# Patient Record
Sex: Male | Born: 1970
Health system: Southern US, Community
[De-identification: ages and names within clinical notes are randomized; demographics above are authoritative.]

## PROBLEM LIST (undated history)

## (undated) DIAGNOSIS — K219 Gastro-esophageal reflux disease without esophagitis: Secondary | ICD-10-CM

## (undated) DIAGNOSIS — I10 Essential (primary) hypertension: Secondary | ICD-10-CM

## (undated) DIAGNOSIS — E0500 Thyrotoxicosis with diffuse goiter without thyrotoxic crisis or storm: Secondary | ICD-10-CM

## (undated) DIAGNOSIS — R519 Headache, unspecified: Secondary | ICD-10-CM

## (undated) DIAGNOSIS — H548 Legal blindness, as defined in USA: Secondary | ICD-10-CM

## (undated) DIAGNOSIS — R51 Headache: Secondary | ICD-10-CM

## (undated) DIAGNOSIS — M1990 Unspecified osteoarthritis, unspecified site: Secondary | ICD-10-CM

## (undated) DIAGNOSIS — E785 Hyperlipidemia, unspecified: Secondary | ICD-10-CM

## (undated) DIAGNOSIS — H269 Unspecified cataract: Secondary | ICD-10-CM

## (undated) DIAGNOSIS — I509 Heart failure, unspecified: Secondary | ICD-10-CM

## (undated) DIAGNOSIS — E039 Hypothyroidism, unspecified: Secondary | ICD-10-CM

## (undated) DIAGNOSIS — F329 Major depressive disorder, single episode, unspecified: Secondary | ICD-10-CM

## (undated) DIAGNOSIS — Z9119 Patient's noncompliance with other medical treatment and regimen: Secondary | ICD-10-CM

## (undated) DIAGNOSIS — F32A Depression, unspecified: Secondary | ICD-10-CM

## (undated) DIAGNOSIS — N189 Chronic kidney disease, unspecified: Secondary | ICD-10-CM

## (undated) DIAGNOSIS — U071 COVID-19: Secondary | ICD-10-CM

## (undated) DIAGNOSIS — D649 Anemia, unspecified: Secondary | ICD-10-CM

## (undated) DIAGNOSIS — H547 Unspecified visual loss: Secondary | ICD-10-CM

## (undated) DIAGNOSIS — Z91199 Patient's noncompliance with other medical treatment and regimen due to unspecified reason: Secondary | ICD-10-CM

## (undated) DIAGNOSIS — G629 Polyneuropathy, unspecified: Secondary | ICD-10-CM

## (undated) DIAGNOSIS — IMO0001 Reserved for inherently not codable concepts without codable children: Secondary | ICD-10-CM

## (undated) HISTORY — DX: COVID-19: U07.1

## (undated) HISTORY — DX: Essential (primary) hypertension: I10

## (undated) HISTORY — PX: CIRCUMCISION: SUR203

## (undated) HISTORY — DX: Unspecified cataract: H26.9

## (undated) HISTORY — PX: EYE SURGERY: SHX253

## (undated) HISTORY — PX: COLONOSCOPY: SHX174

## (undated) HISTORY — DX: Gastro-esophageal reflux disease without esophagitis: K21.9

## (undated) HISTORY — PX: WISDOM TOOTH EXTRACTION: SHX21

## (undated) HISTORY — DX: Hyperlipidemia, unspecified: E78.5

---
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
## 1998-01-02 ENCOUNTER — Emergency Department (HOSPITAL_COMMUNITY): Admission: EM | Admit: 1998-01-02 | Discharge: 1998-01-02 | Payer: Self-pay | Admitting: Emergency Medicine

## 2003-06-18 ENCOUNTER — Encounter: Admission: RE | Admit: 2003-06-18 | Discharge: 2003-06-18 | Payer: Self-pay | Admitting: Family Medicine

## 2003-06-18 ENCOUNTER — Encounter: Payer: Self-pay | Admitting: Family Medicine

## 2004-03-15 ENCOUNTER — Emergency Department (HOSPITAL_COMMUNITY): Admission: EM | Admit: 2004-03-15 | Discharge: 2004-03-15 | Payer: Self-pay | Admitting: Family Medicine

## 2004-07-23 ENCOUNTER — Emergency Department (HOSPITAL_COMMUNITY): Admission: EM | Admit: 2004-07-23 | Discharge: 2004-07-23 | Payer: Self-pay | Admitting: Emergency Medicine

## 2005-11-23 ENCOUNTER — Emergency Department (HOSPITAL_COMMUNITY): Admission: EM | Admit: 2005-11-23 | Discharge: 2005-11-23 | Payer: Self-pay | Admitting: *Deleted

## 2005-11-23 ENCOUNTER — Emergency Department (HOSPITAL_COMMUNITY): Admission: EM | Admit: 2005-11-23 | Discharge: 2005-11-23 | Payer: Self-pay | Admitting: Emergency Medicine

## 2006-01-11 ENCOUNTER — Emergency Department (HOSPITAL_COMMUNITY): Admission: EM | Admit: 2006-01-11 | Discharge: 2006-01-11 | Payer: Self-pay | Admitting: Family Medicine

## 2006-03-04 ENCOUNTER — Emergency Department (HOSPITAL_COMMUNITY): Admission: EM | Admit: 2006-03-04 | Discharge: 2006-03-04 | Payer: Self-pay | Admitting: *Deleted

## 2006-03-24 ENCOUNTER — Ambulatory Visit (HOSPITAL_COMMUNITY): Admission: RE | Admit: 2006-03-24 | Discharge: 2006-03-24 | Payer: Self-pay | Admitting: Orthopedic Surgery

## 2006-04-20 ENCOUNTER — Encounter: Admission: RE | Admit: 2006-04-20 | Discharge: 2006-04-20 | Payer: Self-pay | Admitting: Orthopedic Surgery

## 2006-12-31 ENCOUNTER — Emergency Department (HOSPITAL_COMMUNITY): Admission: EM | Admit: 2006-12-31 | Discharge: 2006-12-31 | Payer: Self-pay | Admitting: Family Medicine

## 2008-04-12 ENCOUNTER — Encounter: Admission: RE | Admit: 2008-04-12 | Discharge: 2008-04-12 | Payer: Self-pay | Admitting: Orthopedic Surgery

## 2008-04-17 ENCOUNTER — Encounter: Admission: RE | Admit: 2008-04-17 | Discharge: 2008-04-17 | Payer: Self-pay | Admitting: Orthopedic Surgery

## 2010-10-24 ENCOUNTER — Encounter: Payer: Self-pay | Admitting: Orthopedic Surgery

## 2011-05-16 ENCOUNTER — Emergency Department (HOSPITAL_COMMUNITY)
Admission: EM | Admit: 2011-05-16 | Discharge: 2011-05-16 | Disposition: A | Discharge status: Home / Self Care | Payer: Self-pay | Attending: Emergency Medicine | Admitting: Emergency Medicine

## 2011-05-16 DIAGNOSIS — X500XXA Overexertion from strenuous movement or load, initial encounter: Secondary | ICD-10-CM | POA: Insufficient documentation

## 2011-05-16 DIAGNOSIS — M545 Low back pain, unspecified: Secondary | ICD-10-CM | POA: Insufficient documentation

## 2011-05-16 DIAGNOSIS — M79609 Pain in unspecified limb: Secondary | ICD-10-CM | POA: Insufficient documentation

## 2011-05-16 DIAGNOSIS — Y9289 Other specified places as the place of occurrence of the external cause: Secondary | ICD-10-CM | POA: Insufficient documentation

## 2011-05-16 DIAGNOSIS — M5430 Sciatica, unspecified side: Secondary | ICD-10-CM | POA: Insufficient documentation

## 2011-05-16 DIAGNOSIS — E119 Type 2 diabetes mellitus without complications: Secondary | ICD-10-CM | POA: Insufficient documentation

## 2011-09-27 ENCOUNTER — Observation Stay (HOSPITAL_COMMUNITY)
Admission: EM | Admit: 2011-09-27 | Discharge: 2011-09-28 | Disposition: A | Discharge status: Home / Self Care | Payer: Self-pay | Attending: Emergency Medicine | Admitting: Emergency Medicine

## 2011-09-27 ENCOUNTER — Encounter: Payer: Self-pay | Admitting: Emergency Medicine

## 2011-09-27 DIAGNOSIS — R739 Hyperglycemia, unspecified: Secondary | ICD-10-CM

## 2011-09-27 DIAGNOSIS — E119 Type 2 diabetes mellitus without complications: Principal | ICD-10-CM | POA: Insufficient documentation

## 2011-09-27 LAB — GLUCOSE, CAPILLARY: Glucose-Capillary: 496 mg/dL — ABNORMAL HIGH (ref 70–99)

## 2011-09-27 LAB — GLUCOSE, RANDOM: Glucose, Bld: 390 mg/dL — ABNORMAL HIGH (ref 70–99)

## 2011-09-27 MED ORDER — SODIUM CHLORIDE 0.9 % IV SOLN
INTRAVENOUS | Status: DC
  Administered 2011-09-28: 2.7 [IU]/h via INTRAVENOUS

## 2011-09-27 MED ORDER — ZOLPIDEM TARTRATE 5 MG PO TABS: 5.0000 mg | ORAL_TABLET | Freq: Every evening | ORAL | Status: DC | PRN

## 2011-09-27 MED ORDER — ONDANSETRON HCL 4 MG/2ML IJ SOLN: 4.0000 mg | Freq: Four times a day (QID) | INTRAMUSCULAR | Status: DC | PRN

## 2011-09-27 MED ORDER — SODIUM CHLORIDE 0.9 % IV BOLUS (SEPSIS)
1000.0000 mL | Freq: Once | INTRAVENOUS | Status: AC
  Administered 2011-09-28: 1000 mL via INTRAVENOUS

## 2011-09-27 MED ORDER — INSULIN REGULAR HUMAN 100 UNIT/ML IJ SOLN
INTRAMUSCULAR | Status: DC
  Filled 2011-09-27: qty 1

## 2011-09-27 NOTE — ED Notes (Signed)
Pt c/o blood sugar being high.  C/o feeling light headed and sleepy.  St's he takes Metformin at home

## 2011-09-27 NOTE — ED Notes (Signed)
States his sugars were high at home. As high as the 500's

## 2011-09-27 NOTE — ED Provider Notes (Signed)
History     CSN: [id number]  Arrival date & time 09/27/11  2000   None     Chief Complaint  Patient presents with  . Hyperglycemia    (Consider location/radiation/quality/duration/timing/severity/associated sxs/prior treatment) Patient is a 40 y.o. male presenting with diabetes problem. The history is provided by the patient.  Diabetes He has type 2 diabetes mellitus. Disease course: The patient admits to chronic noncompliance with medications. Associated symptoms include fatigue. (He is here for evaluation of "not feeling well and I knew my blood sugar must be high". He reports being out of his Glipizide for months and not taking his Metformin. No nausea, vomiting, fever or pain.) (He denies any history of DKA or hospitalization in the past related to his diabetes.) He is compliant with treatment some of the time. His weight is stable. When asked about meal planning, he reported none. He has not had a previous visit with a dietician.    Past Medical History  Diagnosis Date  . Diabetes mellitus     History reviewed. No pertinent past surgical history.  No family history on file.  History  Substance Use Topics  . Smoking status: Never Smoker   . Smokeless tobacco: Not on file  . Alcohol Use: Yes     occasional      Review of Systems  Constitutional: Positive for fatigue. Negative for fever and chills.  HENT: Negative.   Respiratory: Negative.   Cardiovascular: Negative.   Gastrointestinal: Negative.   Genitourinary: Positive for frequency.  Musculoskeletal: Negative.   Skin: Negative.   Neurological: Negative.     Allergies  Review of patient's allergies indicates no known allergies.  Home Medications  No current outpatient prescriptions on file.  BP 129/84  Pulse 90  Temp(Src) 99.1 F (37.3 C) (Oral)  Resp 16  SpO2 98%  Physical Exam  Constitutional: He is oriented to person, place, and time. He appears well-developed and well-nourished.  HENT:    Head: Normocephalic.  Neck: Normal range of motion. Neck supple.  Cardiovascular: Normal rate and regular rhythm.   Pulmonary/Chest: Effort normal and breath sounds normal.  Abdominal: Soft. Bowel sounds are normal. There is no tenderness. There is no rebound and no guarding.  Musculoskeletal: Normal range of motion.  Neurological: He is alert and oriented to person, place, and time. No cranial nerve deficit.  Skin: Skin is warm and dry. No rash noted.  Psychiatric: He has a normal mood and affect.    ED Course  Procedures (including critical care time)  Labs Reviewed  GLUCOSE, CAPILLARY - Abnormal; Notable for the following:    Glucose-Capillary 496 (*)    All other components within normal limits  POCT CBG MONITORING  GLUCOSE, RANDOM  GLUCOSE, RANDOM  BASIC METABOLIC PANEL  BASIC METABOLIC PANEL   No results found.   No diagnosis found.    [place]illi[place], PA 10/07/11 1009

## 2011-09-28 LAB — BASIC METABOLIC PANEL
BUN: 18 mg/dL (ref 6–23)
BUN: 23 mg/dL (ref 6–23)
CO2: 23 mEq/L (ref 19–32)
CO2: 23 mEq/L (ref 19–32)
Calcium: 8.7 mg/dL (ref 8.4–10.5)
Chloride: 106 mEq/L (ref 96–112)
Creatinine, Ser: 1 mg/dL (ref 0.50–1.35)
GFR calc Af Amer: >90 mL/min (ref >90)
Potassium: 3.9 mEq/L (ref 3.5–5.1)
Potassium: 3.9 mEq/L (ref 3.5–5.1)

## 2011-09-28 LAB — GLUCOSE, CAPILLARY
Glucose-Capillary: 263 mg/dL — ABNORMAL HIGH (ref 70–99)
Glucose-Capillary: 220 mg/dL — ABNORMAL HIGH (ref 70–99)

## 2011-09-28 MED ORDER — INSULIN ASPART 100 UNIT/ML ~~LOC~~ SOLN
0.0000 [IU] | Freq: Three times a day (TID) | SUBCUTANEOUS | Status: DC
  Administered 2011-09-28: 3 [IU] via SUBCUTANEOUS
  Filled 2011-09-28: qty 1

## 2011-09-28 MED ORDER — INSULIN ASPART 100 UNIT/ML ~~LOC~~ SOLN: 0.0000 [IU] | Freq: Every day | SUBCUTANEOUS | Status: DC

## 2011-09-28 MED ORDER — INSULIN GLARGINE 100 UNIT/ML ~~LOC~~ SOLN: 10.0000 [IU] | Freq: Every day | SUBCUTANEOUS | Status: DC

## 2011-09-28 MED ORDER — INSULIN ASPART 100 UNIT/ML ~~LOC~~ SOLN: 0.0000 [IU] | Freq: Once | SUBCUTANEOUS | Status: DC

## 2011-09-28 NOTE — ED Notes (Signed)
Patient ambulated to bathroom. Gait steady. No complaints at this time

## 2011-09-28 NOTE — ED Notes (Signed)
PA notified of need for sliding scale order.

## 2011-09-28 NOTE — ED Notes (Signed)
Meal given to patient 

## 2011-09-28 NOTE — ED Notes (Signed)
Adjusted insulin per protocol on pump to 4.1

## 2011-09-28 NOTE — ED Provider Notes (Signed)
8:10 AM Patient care resumed from [name].  Plan is to monitor patient's hyperglycemia and keep him here until case management is able to come and set up OP diabetic counseling. They will arrive by 930 am. Nurse has been instructed to contact DM councilor to see if pt can receive some counseling in ED prior to dc.   Patient is currently hemodynamically stable & comfortably sleeping.  Glucose checks and insulin sliding scale have been ordered as well as a carbon monoxide diet.  Patient will be reevaluated prior to discharge.  10:04 AM The emergency department nurse has discussed the importance of diabetic medication compliance and what be necessary yearly exams are for followup of diabetes.  Patient will be recommended for an outpatient diabetes education clinic that he will have to call to schedule an appointment for. Patient is currently hemodynamically stable and has no complaints.  [place], [place] 09/28/11 1007

## 2011-09-28 NOTE — ED Provider Notes (Signed)
  Physical Exam  BP 115/73  Pulse 90  Temp(Src) 99.1 F (37.3 C) (Oral)  Resp 20  SpO2 96%  Physical Exam  ED Course  Procedures  MDM Patient's blood sugar is now 200 and, 20 we'll keep him here for several more hours to see case management in the morning, as well as diabetic teaching      Garald Balding, NP 09/28/11 [phone]

## 2011-09-28 NOTE — Progress Notes (Signed)
Met with patient after receiving call for diabetes education. Patient states he has had diabetes "longer than he can remember". Patient also states that his diabetic meds are prescribed by [facility]. I provided patient with some general diabetic information and informed him that we will be referring him to outpatient diabetes education.

## 2011-09-28 NOTE — ED Notes (Signed)
Case management called by ED secretary for consult. Pt awaiting consult for follow up diabetic teaching needs.

## 2011-09-28 NOTE — ED Provider Notes (Signed)
Medical screening examination/treatment/procedure(s) were performed by non-physician practitioner and as supervising physician I was immediately available for consultation/collaboration.   Lezlie Octave, MD 09/28/11 1600

## 2011-10-10 NOTE — ED Provider Notes (Signed)
Medical screening examination/treatment/procedure(s) were performed by non-physician practitioner and as supervising physician I was immediately available for consultation/collaboration.   Trisha Mangle, MD 10/10/11 [phone]

## 2012-02-20 ENCOUNTER — Emergency Department (HOSPITAL_COMMUNITY)
Admission: EM | Admit: 2012-02-20 | Discharge: 2012-02-20 | Disposition: A | Discharge status: Home / Self Care | Payer: Self-pay | Attending: Emergency Medicine | Admitting: Emergency Medicine

## 2012-02-20 ENCOUNTER — Encounter (HOSPITAL_COMMUNITY): Payer: Self-pay | Admitting: Emergency Medicine

## 2012-02-20 DIAGNOSIS — S01119A Laceration without foreign body of unspecified eyelid and periocular area, initial encounter: Secondary | ICD-10-CM | POA: Insufficient documentation

## 2012-02-20 DIAGNOSIS — E119 Type 2 diabetes mellitus without complications: Secondary | ICD-10-CM | POA: Insufficient documentation

## 2012-02-20 DIAGNOSIS — S20219A Contusion of unspecified front wall of thorax, initial encounter: Secondary | ICD-10-CM | POA: Insufficient documentation

## 2012-02-20 DIAGNOSIS — Z79899 Other long term (current) drug therapy: Secondary | ICD-10-CM | POA: Insufficient documentation

## 2012-02-20 DIAGNOSIS — S6010XA Contusion of unspecified finger with damage to nail, initial encounter: Secondary | ICD-10-CM

## 2012-02-20 DIAGNOSIS — S6000XA Contusion of unspecified finger without damage to nail, initial encounter: Secondary | ICD-10-CM | POA: Insufficient documentation

## 2012-02-20 MED ORDER — TETANUS-DIPHTH-ACELL PERTUSSIS 5-2.5-18.5 LF-MCG/0.5 IM SUSP
0.5000 mL | Freq: Once | INTRAMUSCULAR | Status: AC
  Administered 2012-02-20: 0.5 mL via INTRAMUSCULAR
  Filled 2012-02-20: qty 0.5

## 2012-02-20 MED ORDER — HYDROCODONE-ACETAMINOPHEN 5-500 MG PO TABS: 1.0000 | ORAL_TABLET | Freq: Four times a day (QID) | ORAL | Status: AC | PRN

## 2012-02-20 MED ORDER — AMOXICILLIN-POT CLAVULANATE 500-125 MG PO TABS: 1.0000 | ORAL_TABLET | Freq: Three times a day (TID) | ORAL | Status: AC

## 2012-02-20 MED ORDER — OXYCODONE-ACETAMINOPHEN 5-325 MG PO TABS
1.0000 | ORAL_TABLET | Freq: Once | ORAL | Status: AC
  Filled 2012-02-20: qty 1

## 2012-02-20 MED ORDER — TETANUS-DIPHTH-ACELL PERTUSSIS 5-2-15.5 LF-MCG/0.5 IM SUSP: 0.5000 mL | Freq: Once | INTRAMUSCULAR | Status: DC

## 2012-02-20 MED ORDER — IBUPROFEN 200 MG PO TABS
600.0000 mg | ORAL_TABLET | Freq: Once | ORAL | Status: AC
  Administered 2012-02-20: 600 mg via ORAL
  Filled 2012-02-20: qty 1
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
## 2012-10-03 DIAGNOSIS — E0500 Thyrotoxicosis with diffuse goiter without thyrotoxic crisis or storm: Secondary | ICD-10-CM

## 2012-10-03 HISTORY — DX: Thyrotoxicosis with diffuse goiter without thyrotoxic crisis or storm: E05.00

## 2013-06-05 ENCOUNTER — Emergency Department (HOSPITAL_COMMUNITY)
Admission: EM | Admit: 2013-06-05 | Discharge: 2013-06-05 | Disposition: A | Discharge status: Home / Self Care | Payer: Self-pay | Attending: Emergency Medicine | Admitting: Emergency Medicine

## 2013-06-05 ENCOUNTER — Encounter (HOSPITAL_COMMUNITY): Payer: Self-pay | Admitting: Emergency Medicine

## 2013-06-05 DIAGNOSIS — H538 Other visual disturbances: Secondary | ICD-10-CM | POA: Insufficient documentation

## 2013-06-05 DIAGNOSIS — R739 Hyperglycemia, unspecified: Secondary | ICD-10-CM

## 2013-06-05 DIAGNOSIS — R5381 Other malaise: Secondary | ICD-10-CM | POA: Insufficient documentation

## 2013-06-05 DIAGNOSIS — E119 Type 2 diabetes mellitus without complications: Secondary | ICD-10-CM | POA: Insufficient documentation

## 2013-06-05 LAB — URINALYSIS, ROUTINE W REFLEX MICROSCOPIC
Bilirubin Urine: NEGATIVE
Ketones, ur: NEGATIVE mg/dL
Specific Gravity, Urine: 1.028 (ref 1.005–1.030)
Urobilinogen, UA: 0.2 mg/dL (ref 0.0–1.0)
pH: 5 (ref 5.0–8.0)

## 2013-06-05 LAB — COMPREHENSIVE METABOLIC PANEL
ALT: 37 U/L (ref 0–53)
AST: 21 U/L (ref 0–37)
Alkaline Phosphatase: 139 U/L — ABNORMAL HIGH (ref 39–117)
CO2: 22 mEq/L (ref 19–32)
Calcium: 9.7 mg/dL (ref 8.4–10.5)
Chloride: 101 mEq/L (ref 96–112)
GFR calc non Af Amer: >90 mL/min (ref >90)
Potassium: 4.2 mEq/L (ref 3.5–5.1)
Sodium: 133 mEq/L — ABNORMAL LOW (ref 135–145)

## 2013-06-05 LAB — CBC
MCH: 30.2 pg (ref 26.0–34.0)
Platelets: 233 10*3/uL (ref 150–400)
RBC: 4.37 MIL/uL (ref 4.22–5.81)
WBC: 4.8 10*3/uL (ref 4.0–10.5)

## 2013-06-05 LAB — GLUCOSE, CAPILLARY
Glucose-Capillary: 486 mg/dL — ABNORMAL HIGH (ref 70–99)
Glucose-Capillary: 358 mg/dL — ABNORMAL HIGH (ref 70–99)

## 2013-06-05 LAB — URINE MICROSCOPIC-ADD ON

## 2013-06-05 MED ORDER — SODIUM CHLORIDE 0.9 % IV BOLUS (SEPSIS): 1000.0000 mL | Freq: Once | INTRAVENOUS | Status: DC

## 2013-06-05 MED ORDER — SODIUM CHLORIDE 0.9 % IV BOLUS (SEPSIS)
2000.0000 mL | Freq: Once | INTRAVENOUS | Status: AC
  Administered 2013-06-05: 2000 mL via INTRAVENOUS

## 2013-06-05 MED ORDER — INSULIN ASPART 100 UNIT/ML ~~LOC~~ SOLN
10.0000 [IU] | Freq: Once | SUBCUTANEOUS | Status: AC
  Administered 2013-06-05: 10 [IU] via SUBCUTANEOUS
  Filled 2013-06-05: qty 1

## 2013-06-05 MED ORDER — METFORMIN HCL 500 MG PO TABS: 500.0000 mg | ORAL_TABLET | Freq: Every day | ORAL | Status: DC

## 2013-06-05 NOTE — ED Provider Notes (Signed)
CSN: [id number]     Arrival date & time 06/05/13  1248 History   First MD Initiated Contact with Patient 06/05/13 1511     Chief Complaint  Patient presents with  . Hyperglycemia   (Consider location/radiation/quality/duration/timing/severity/associated sxs/prior Treatment) HPI Comments: Patient is a 42 year old male with a past medical history of untreated DM who presents with a several week history of "not feeling well" and intermittent blurry vision. Symptoms started gradually and have been intermittent for the past few weeks. Patient reports feeling this way when his blood sugar is high. Patient checks his glucose at home infrequently and hasn't taken metformin for the past 10 years because he cannot afford the doctor's visits and medications. Patient reports trying to watch what he eats at home. No aggravating/alleviating factors. No other associated symptoms.    Past Medical History  Diagnosis Date  . Diabetes mellitus    History reviewed. No pertinent past surgical history. Family History  Problem Relation Age of Onset  . Hypertension Mother   . Diabetes Mother    History  Substance Use Topics  . Smoking status: Never Smoker   . Smokeless tobacco: Not on file  . Alcohol Use: Yes     Comment: occasional    Review of Systems  Constitutional: Positive for fatigue.       Hyperglycemia  Eyes: Positive for visual disturbance.  All other systems reviewed and are negative.    Allergies  Review of patient's allergies indicates no known allergies.  Home Medications   Current Outpatient Rx  Name  Route  Sig  Dispense  Refill  . Multiple Vitamin (MULTIVITAMIN WITH MINERALS) TABS tablet   Oral   Take 1 tablet by mouth daily.          BP 145/90  Pulse 81  Temp(Src) 97.9 F (36.6 C) (Oral)  Resp 18  SpO2 99% Physical Exam  Nursing note and vitals reviewed. Constitutional: He is oriented to person, place, and time. He appears well-developed and well-nourished. No  distress.  HENT:  Head: Normocephalic and atraumatic.  Mouth/Throat: Oropharynx is clear and moist. No oropharyngeal exudate.  Eyes: Conjunctivae and EOM are normal. Pupils are equal, round, and reactive to light. No scleral icterus.  Neck: Normal range of motion.  Cardiovascular: Normal rate and regular rhythm.  Exam reveals no gallop and no friction rub.   No murmur heard. Pulmonary/Chest: Effort normal and breath sounds normal. He has no wheezes. He has no rales. He exhibits no tenderness.  Abdominal: Soft. He exhibits no distension. There is no tenderness. There is no rebound and no guarding.  Musculoskeletal: Normal range of motion.  Neurological: He is alert and oriented to person, place, and time. Coordination normal.  Speech is goal-oriented. Moves limbs without ataxia.   Skin: Skin is warm and dry.  Psychiatric: He has a normal mood and affect. His behavior is normal.    ED Course  Procedures (including critical care time) Labs Review Labs Reviewed  GLUCOSE, CAPILLARY - Abnormal; Notable for the following:    Glucose-Capillary 486 (*)    All other components within normal limits  CBC - Abnormal; Notable for the following:    HCT 36.2 (*)    MCHC 36.5 (*)    All other components within normal limits  COMPREHENSIVE METABOLIC PANEL - Abnormal; Notable for the following:    Sodium 133 (*)    Glucose, Bld 482 (*)    Albumin 3.4 (*)    Alkaline Phosphatase 139 (*)  All other components within normal limits  URINALYSIS, ROUTINE W REFLEX MICROSCOPIC - Abnormal; Notable for the following:    Glucose, UA >1000 (*)    Hgb urine dipstick TRACE (*)    All other components within normal limits  GLUCOSE, CAPILLARY - Abnormal; Notable for the following:    Glucose-Capillary 358 (*)    All other components within normal limits  GLUCOSE, CAPILLARY - Abnormal; Notable for the following:    Glucose-Capillary 261 (*)    All other components within normal limits  URINE  MICROSCOPIC-ADD ON   Imaging Review No results found.  MDM   1. Hyperglycemia without ketosis     3:15 PM Labs shows hyperglycemia at 482. No elevated anion gap. Other labs and urinalysis unremarkable. Vitals stable and patient afebrile. Patient will have fluid bolus here.   5:51 PM Patient feeling better. Last CBG 261. Patient will have 10 units insulin. Patient provided with resource guide to get his medications. I will discharge the patient with a prescription for Metformin. Patient instructed to return with worsening or concerning symptoms.     Alvina Chou, PA-C 06/05/13 1755

## 2013-06-05 NOTE — ED Notes (Signed)
Pt discharged.Vital signs stable and GCS 15 

## 2013-06-05 NOTE — Progress Notes (Signed)
   CARE MANAGEMENT ED NOTE 06/05/2013  Patient:  JAFFET, SILSBY   Account Number:  [id number]  Date Initiated:  06/05/2013  Documentation initiated by:  [facility]  Subjective/Objective Assessment:   presented to ED wth hyperglycemia     Subjective/Objective Assessment Detail:     Action/Plan:   Action/Plan Detail:   Anticipated DC Date:  06/05/2013     Status Recommendation to Physician:   Result of Recommendation:    Other ED Services  Consult Working [place]  CM consult  GCCN / P4HM (established/new)  Medication Assistance  PCP issues    Choice offered to / List presented to:  C-1 Patient          Status of service:  Completed, signed off  ED Comments:   ED Comments Detail:  06/05/2013 18:30 [address]. [address] [place] [phone] [id number] ED CM in to meet with patient. Pt presents to ED with hyperglycemia. Pt states, that he does not have health isurance since lossing his job. So he has not been able to see a Physician. Informed patient of the [facility] and [facility]. Also explained the [employer] of [facility] and how to enroll. Provided written and verbal  information and, along with [facility].  Pt in agrrement with the plan for follow up with [place]. With patient's permission I will forward information by email to the [facility] to schedule an [facility] card eligibility appt and follow up with a PCP. Pt states,takes Metformin. I have informed him and Theatre stage manager in [place] that he can obtain from [employer] free of charge. Pt verbalizes understanding and agrees with the discharge plan. No further CM needs identified.

## 2013-06-05 NOTE — ED Notes (Signed)
Pt c/o not feeling well and blurry vision x several weeks; pt sts not taking DM x years

## 2013-06-08 NOTE — ED Provider Notes (Signed)
Medical screening examination/treatment/procedure(s) were performed by non-physician practitioner and as supervising physician I was immediately available for consultation/collaboration.  Houston Siren, MD 06/08/13 [phone]

## 2013-06-19 ENCOUNTER — Ambulatory Visit: Payer: Self-pay | Attending: Internal Medicine | Admitting: Internal Medicine

## 2013-06-19 ENCOUNTER — Encounter: Payer: Self-pay | Admitting: Internal Medicine

## 2013-06-19 VITALS — BP 136/89 | HR 60 | Temp 98.6°F | Resp 16 | Ht 71.65 in | Wt 215.0 lb

## 2013-06-19 DIAGNOSIS — E119 Type 2 diabetes mellitus without complications: Secondary | ICD-10-CM

## 2013-06-19 LAB — POCT GLYCOSYLATED HEMOGLOBIN (HGB A1C): Hemoglobin A1C: 11.5

## 2013-06-19 MED ORDER — GLIMEPIRIDE 2 MG PO TABS: 2.0000 mg | ORAL_TABLET | Freq: Every day | ORAL | Status: DC

## 2013-06-19 MED ORDER — METFORMIN HCL 500 MG PO TABS: 1000.0000 mg | ORAL_TABLET | Freq: Two times a day (BID) | ORAL | Status: DC

## 2013-06-19 NOTE — Progress Notes (Unsigned)
HFU  Pt went to the hospital with elevated blood sugar. Here today to receive F/U treatment

## 2013-06-19 NOTE — Progress Notes (Unsigned)
Patient Demographics  Edward [name], is a 42 y.o. male  [id number]  [id number]  DOB - 1970-11-09  Chief Complaint  Patient presents with  . Establish Care        Subjective:   Edward [name] today is here to establish primary care. Patient is a 42 year old black male with a past medical history of diabetes for more than 10 years, previously on metformin and glipizide, noncompliant for the past 2 years, she started on metformin a week ago. Was seen in the emergency room for uncontrolled diabetes and referred here to establish care. Currently the patient has no complaints.  Currently patient has no complaints. Patient has also has No headache, No chest pain, No abdominal pain,No Nausea, No new weakness tingling or numbness, No Cough or SOB.   Objective:    Filed Vitals:   06/19/13 1111  BP: 136/89  Pulse: 60  Temp: 98.6 F (37 C)  TempSrc: Oral  Resp: 16  Height: 5' 11.65" (1.82 m)  Weight: 215 lb (97.523 kg)  SpO2: 99%     ALLERGIES:  No Known Allergies  PAST MEDICAL HISTORY: Past Medical History  Diagnosis Date  . Diabetes mellitus     PAST SURGICAL HISTORY: History reviewed. No pertinent past surgical history.  FAMILY HISTORY: Family History  Problem Relation Age of Onset  . Hypertension Mother   . Diabetes Mother     MEDICATIONS AT HOME: Prior to Admission medications   Medication Sig Start Date End Date Taking? Authorizing Provider  metFORMIN (GLUCOPHAGE) 500 MG tablet Take 1 tablet (500 mg total) by mouth daily with breakfast. 06/05/13   Alvina Chou, PA-C  Multiple Vitamin (MULTIVITAMIN WITH MINERALS) TABS tablet Take 1 tablet by mouth daily.    Historical Provider, MD    SOCIAL HISTORY:   reports that he has never smoked. He does not have any smokeless tobacco history on file. He reports that  drinks alcohol. He reports that he does not use illicit drugs.  REVIEW OF SYSTEMS:  Constitutional:   No   Fevers, chills,  fatigue.  HEENT:    No headaches, Sore throat,   Cardio-vascular: No chest pain,  Orthopnea, swelling in lower extremities, anasarca, palpitations  GI:  No abdominal pain, nausea, vomiting, diarrhea  Resp: No shortness of breath,  No coughing up of blood.No cough.No wheezing.  Skin:  no rash or lesions.  GU:  no dysuria, change in color of urine, no urgency or frequency.  No flank pain.  Musculoskeletal: No joint pain or swelling.  No decreased range of motion.  No back pain.  Psych: No change in mood or affect. No depression or anxiety.  No memory loss.   Exam  General appearance :Awake, alert, not in any distress. Speech Clear. Not toxic Looking HEENT: Atraumatic and Normocephalic, pupils equally reactive to light and accomodation Neck: supple, no JVD. No cervical lymphadenopathy.  Chest:Good air entry bilaterally, no added sounds  CVS: S1 S2 regular, no murmurs.  Abdomen: Bowel sounds present, Non tender and not distended with no gaurding, rigidity or rebound. Extremities: B/L Lower Ext shows no edema, both legs are warm to touch Neurology: Awake alert, and oriented X 3, CN II-XII intact, Non focal Skin:No Rash Wounds:N/A    Data Review   CBC No results found for this basename: WBC, HGB, HCT, PLT, MCV, MCH, MCHC, RDW, NEUTRABS, LYMPHSABS, MONOABS, EOSABS, BASOSABS, BANDABS, BANDSABD,  in the last 168 hours  Chemistries   No results found for this basename: NA, K, CL,  CO2, GLUCOSE, BUN, CREATININE, GFRCGP, CALCIUM, MG, AST, ALT, ALKPHOS, BILITOT,  in the last 168 hours ------------------------------------------------------------------------------------------------------------------ No results found for this basename: HGBA1C,  in the last 72 hours ------------------------------------------------------------------------------------------------------------------ No results found for this basename: CHOL, HDL, LDLCALC, TRIG, CHOLHDL, LDLDIRECT,  in the last 72  hours ------------------------------------------------------------------------------------------------------------------ No results found for this basename: TSH, T4TOTAL, FREET3, T3FREE, THYROIDAB,  in the last 72 hours ------------------------------------------------------------------------------------------------------------------ No results found for this basename: VITAMINB12, FOLATE, FERRITIN, TIBC, IRON, RETICCTPCT,  in the last 72 hours  Coagulation profile  No results found for this basename: INR, PROTIME,  in the last 168 hours    Assessment & Plan  Uncontrolled diabetes - Secondary to noncompliance - Apparently not taking medications for 2 years, and she started taking metformin 500 mg daily since last week - Will increase metformin to 1000 mg twice a day, start Amaryl 2 mg daily - Point-of-care A1c is 11.6-ideally needs insulin-patient at this time does not want to start insulin, as a result we will start the above noted medications and follow him closely. - If in the next few weeks she does continue to be uncontrolled, he will need to go on insulin. - Check lipids and TSH - Patient has been asked to bring his CBGs diary at next visit.  Health Maintenance -Vaccinations:Flu vaccine  Follow up in 2-3 weeks  The patient was given clear instructions to go to ER or return to medical center if symptoms don't improve, worsen or new problems develop. The patient verbalized understanding. The patient was told to call to get lab results if they haven't heard anything in the next week.

## 2013-06-21 ENCOUNTER — Ambulatory Visit: Payer: Self-pay | Attending: Internal Medicine

## 2013-06-21 DIAGNOSIS — E119 Type 2 diabetes mellitus without complications: Secondary | ICD-10-CM

## 2013-06-22 LAB — HEMOGLOBIN A1C
Hgb A1c MFr Bld: 12.2 % — ABNORMAL HIGH (ref <5.7)
Mean Plasma Glucose: 303 mg/dL — ABNORMAL HIGH (ref <117)

## 2013-06-22 LAB — LIPID PANEL
Cholesterol: 143 mg/dL (ref 0–200)
LDL Cholesterol: 60 mg/dL (ref 0–99)
Triglycerides: 157 mg/dL — ABNORMAL HIGH (ref <150)
VLDL: 31 mg/dL (ref 0–40)

## 2013-06-25 ENCOUNTER — Telehealth: Payer: Self-pay | Admitting: Emergency Medicine

## 2013-06-25 NOTE — Telephone Encounter (Signed)
Message copied by Ricci Barker on Tue Jun 25, 2013 11:31 AM ------      Message from: Jonetta Osgood      Created: Tue Jun 25, 2013  9:22 AM       TSH is very low, likely has hyperthyroidism, please have patient come in in the next 1-2 days, for a lab draw for free T4, and then needs to come back for follow up within a week from the the lab draw.Please notify patient regarding low TSH. ------

## 2013-06-25 NOTE — Telephone Encounter (Signed)
LEFT A MESSAGE FOR PT TO CALL AND MAKE LAB APPT FOR THYROID TESTING

## 2013-06-25 NOTE — Progress Notes (Signed)
CALLED PT AND LEFT MESSAGE FOR PT TO CALL FOR LAB VISIT IN THE NEXT 2 DYS. YOU CAN SCHEDULE IF HE CALLS

## 2013-06-28 ENCOUNTER — Telehealth: Payer: Self-pay | Admitting: Emergency Medicine

## 2013-06-28 NOTE — Telephone Encounter (Signed)
Left a message to make sure he keeps appt 07/04/13 for blood work due to low TSH

## 2013-07-04 ENCOUNTER — Ambulatory Visit: Payer: Self-pay

## 2013-08-02 ENCOUNTER — Encounter (HOSPITAL_COMMUNITY): Payer: Self-pay | Admitting: Emergency Medicine

## 2013-08-02 ENCOUNTER — Emergency Department (HOSPITAL_COMMUNITY)
Admission: EM | Admit: 2013-08-02 | Discharge: 2013-08-02 | Disposition: A | Discharge status: Home / Self Care | Payer: Self-pay | Attending: Emergency Medicine | Admitting: Emergency Medicine

## 2013-08-02 DIAGNOSIS — E119 Type 2 diabetes mellitus without complications: Secondary | ICD-10-CM | POA: Insufficient documentation

## 2013-08-02 DIAGNOSIS — Z79899 Other long term (current) drug therapy: Secondary | ICD-10-CM | POA: Insufficient documentation

## 2013-08-02 DIAGNOSIS — G8929 Other chronic pain: Secondary | ICD-10-CM | POA: Insufficient documentation

## 2013-08-02 DIAGNOSIS — M545 Low back pain, unspecified: Secondary | ICD-10-CM | POA: Insufficient documentation

## 2013-08-02 MED ORDER — DIAZEPAM 5 MG PO TABS
5.0000 mg | ORAL_TABLET | Freq: Once | ORAL | Status: AC
  Administered 2013-08-02: 5 mg via ORAL
  Filled 2013-08-02: qty 1

## 2013-08-02 MED ORDER — HYDROMORPHONE HCL PF 1 MG/ML IJ SOLN
1.0000 mg | Freq: Once | INTRAMUSCULAR | Status: AC
  Administered 2013-08-02: 1 mg via INTRAMUSCULAR
  Filled 2013-08-02: qty 1

## 2013-08-02 MED ORDER — NAPROXEN 500 MG PO TABS: 500.0000 mg | ORAL_TABLET | Freq: Two times a day (BID) | ORAL | Status: DC

## 2013-08-02 MED ORDER — DIAZEPAM 5 MG PO TABS: 5.0000 mg | ORAL_TABLET | Freq: Two times a day (BID) | ORAL | Status: DC

## 2013-08-02 MED ORDER — OXYCODONE-ACETAMINOPHEN 5-325 MG PO TABS: 1.0000 | ORAL_TABLET | Freq: Four times a day (QID) | ORAL | Status: DC | PRN

## 2013-08-02 MED ORDER — KETOROLAC TROMETHAMINE 60 MG/2ML IM SOLN
60.0000 mg | Freq: Once | INTRAMUSCULAR | Status: AC
  Administered 2013-08-02: 60 mg via INTRAMUSCULAR
  Filled 2013-08-02: qty 2

## 2013-08-02 NOTE — ED Notes (Signed)
Pt has hx of "deteriorating disc". Has been tx by a chiropractor in the past. Has also had "steroid injections". Pt appears to be extremely uncomfortable.

## 2013-08-02 NOTE — ED Notes (Signed)
Discharge instructions reviewed with significant other. Significant other verbalized understanding.

## 2013-08-02 NOTE — ED Notes (Signed)
Pt c/o lower back pain that is chronic in nature worse x 3 days; pt sts stands as chief for work

## 2013-08-02 NOTE — ED Notes (Signed)
Robyn, [place] asked that pt be moved to acute bed.

## 2013-08-02 NOTE — ED Provider Notes (Signed)
MSE was initiated and I personally evaluated the patient and placed orders (if any) at  10:09 AM on August 02, 2013. Patient with chronic low back pain, worsening over the past 3 days. No known injury or trauma. Patient appears uncomfortable, unable to move for full examination. He does not present to the emergency department frequently for this issue. He was given Toradol and Valium with no change. Unable to perform good physical exam due to discomfort. Will give Dilaudid, have patient moved over to  The main side of the ED for further management. The patient appears stable so that the remainder of the MSE may be completed by another provider.  Illene Labrador, PA-C 08/02/13 1009

## 2013-08-02 NOTE — ED Provider Notes (Signed)
Medical screening examination/treatment/procedure(s) were performed by non-physician practitioner and as supervising physician I was immediately available for consultation/collaboration.   Leota Jacobsen, MD 08/02/13 [phone]

## 2013-08-02 NOTE — ED Provider Notes (Signed)
CSN: [id number]     Arrival date & time 08/02/13  [id number] History   First MD Initiated Contact with Patient 08/02/13 [phone]     Chief Complaint  Patient presents with  . Back Pain   (Consider location/radiation/quality/duration/timing/severity/associated sxs/prior Treatment) HPI Comments: Patient with a history of chronic lower back pain presents today with a chief complaint of lower back pain.  He reports that the pain worsened three days ago.  Pain does not radiate.  Pain worse with movement. He denies any acute injury or trauma.  He denies numbness, tingling, fever, chills, weakness, bowel incontinence, or bladder incontinence.  He reports that he took Tylenol for the pain this morning, but does not think that it helps.  He reports that in the past he has had had Cortisone shots in his back, but has not had any shots recently.  He denies any history of Cancer or IVDU.    Patient is a 42 y.o. male presenting with back pain. The history is provided by the patient.  Back Pain   Past Medical History  Diagnosis Date  . Diabetes mellitus    History reviewed. No pertinent past surgical history. Family History  Problem Relation Age of Onset  . Hypertension Mother   . Diabetes Mother    History  Substance Use Topics  . Smoking status: Never Smoker   . Smokeless tobacco: Not on file  . Alcohol Use: Yes     Comment: occasional    Review of Systems  Musculoskeletal: Positive for back pain.  All other systems reviewed and are negative.    Allergies  Review of patient's allergies indicates no known allergies.  Home Medications   Current Outpatient Rx  Name  Route  Sig  Dispense  Refill  . metFORMIN (GLUCOPHAGE) 500 MG tablet   Oral   Take 2 tablets (1,000 mg total) by mouth 2 (two) times daily with a meal.   120 tablet   3    BP 133/91  Pulse 88  Temp(Src) 98.4 F (36.9 C) (Oral)  Resp 18  SpO2 98% Physical Exam  Nursing note and vitals reviewed. Constitutional: He  appears well-developed and well-nourished.  HENT:  Head: Normocephalic and atraumatic.  Mouth/Throat: Oropharynx is clear and moist.  Neck: Normal range of motion. Neck supple.  Cardiovascular: Normal rate, regular rhythm and normal heart sounds.   Pulmonary/Chest: Effort normal and breath sounds normal.  Musculoskeletal: Normal range of motion.       Cervical back: He exhibits normal range of motion, no tenderness, no bony tenderness, no swelling, no edema and no deformity.       Thoracic back: He exhibits normal range of motion, no tenderness, no bony tenderness, no swelling, no edema and no deformity.       Lumbar back: He exhibits tenderness and bony tenderness. He exhibits normal range of motion, no swelling, no edema and no deformity.  Neurological: He is alert. He has normal strength. No sensory deficit.  Reflex Scores:      Patellar reflexes are 2+ on the right side and 2+ on the left side.      Achilles reflexes are 2+ on the right side and 2+ on the left side. Skin: Skin is warm and dry.  Psychiatric: He has a normal mood and affect.    ED Course  Procedures (including critical care time) Labs Review Labs Reviewed - No data to display Imaging Review No results found.  EKG Interpretation   None  MDM  No diagnosis found. Patient with back pain.  No neurological deficits and normal neuro exam.  Patient can walk but states is very painful.  No loss of bowel or bladder control.  No concern for cauda equina.  No fever, night sweats, weight loss, h/o cancer, IVDU.  RICE protocol and pain medicine indicated and discussed with patient.  Patient stable for discharge.  Strict return precautions given.     Hyman Bible, PA-C 08/02/13 [phone]

## 2013-08-02 NOTE — ED Notes (Signed)
Pt moved per exam chair to POD A-6. Report to Hassan Rowan, Therapist, sports.

## 2013-08-05 NOTE — ED Provider Notes (Signed)
Medical screening examination/treatment/procedure(s) were performed by non-physician practitioner and as supervising physician I was immediately available for consultation/collaboration.   Leota Jacobsen, MD 08/05/13 [phone]

## 2014-08-13 ENCOUNTER — Emergency Department (HOSPITAL_COMMUNITY): Payer: Self-pay

## 2014-08-13 ENCOUNTER — Emergency Department (HOSPITAL_COMMUNITY)
Admission: EM | Admit: 2014-08-13 | Discharge: 2014-08-13 | Disposition: A | Discharge status: Home / Self Care | Payer: Self-pay | Attending: Emergency Medicine | Admitting: Emergency Medicine

## 2014-08-13 ENCOUNTER — Encounter (HOSPITAL_COMMUNITY): Payer: Self-pay | Admitting: Emergency Medicine

## 2014-08-13 DIAGNOSIS — M79642 Pain in left hand: Secondary | ICD-10-CM | POA: Insufficient documentation

## 2014-08-13 DIAGNOSIS — Z791 Long term (current) use of non-steroidal anti-inflammatories (NSAID): Secondary | ICD-10-CM | POA: Insufficient documentation

## 2014-08-13 DIAGNOSIS — Z79899 Other long term (current) drug therapy: Secondary | ICD-10-CM | POA: Insufficient documentation

## 2014-08-13 DIAGNOSIS — E119 Type 2 diabetes mellitus without complications: Secondary | ICD-10-CM | POA: Insufficient documentation

## 2014-08-13 LAB — CBG MONITORING, ED: Glucose-Capillary: 292 mg/dL — ABNORMAL HIGH (ref 70–99)

## 2014-08-13 MED ORDER — MELOXICAM 7.5 MG PO TABS: 7.5000 mg | ORAL_TABLET | Freq: Every day | ORAL | Status: DC

## 2014-08-13 MED ORDER — GABAPENTIN 100 MG PO CAPS: 100.0000 mg | ORAL_CAPSULE | Freq: Three times a day (TID) | ORAL | Status: DC

## 2014-08-13 MED ORDER — METFORMIN HCL 500 MG PO TABS: 1000.0000 mg | ORAL_TABLET | Freq: Two times a day (BID) | ORAL | Status: DC

## 2014-08-13 NOTE — ED Notes (Signed)
Pt requesting wrist splint.

## 2014-08-13 NOTE — ED Notes (Addendum)
C/O left hand pain on and off for over a year. Works as Biomedical scientist. States he does NOT take his diabetes medicine. States he does not have a PCP.

## 2014-08-13 NOTE — ED Provider Notes (Signed)
CSN: [id number]     Arrival date & time 08/13/14  1303 History  This chart was scribed for non-physician practitioner, Noland Fordyce, PA-C, working with Pamella Pert, MD by Ladene Artist, ED Scribe. This patient was seen in room TR05C/TR05C and the patient's care was started at 1:54 PM.   Chief Complaint  Patient presents with  . Hand Pain   The history is provided by the patient. No language interpreter was used.   HPI Comments: Edward [name] is a 43 y.o. male, with a h/o DM, who presents to the Emergency Department complaining of constant L hand pain that radiates into L wrist over the past few days. Pt describes the pain as a cramping and throbbing sensation. He currently rates the pain as 9/10. He states that pain is so severe that he is not able to hold anything. He denies injury. He also denies numbness/tingling. No past hand surgeries. Pt is R hand dominant.   Past Medical History  Diagnosis Date  . Diabetes mellitus    Past Surgical History  Procedure Laterality Date  . Eye surgery     Family History  Problem Relation Age of Onset  . Hypertension Mother   . Diabetes Mother    History  Substance Use Topics  . Smoking status: Never Smoker   . Smokeless tobacco: Not on file  . Alcohol Use: Yes     Comment: occasional    Review of Systems  Musculoskeletal: Positive for myalgias.  All other systems reviewed and are negative.  Allergies  Review of patient's allergies indicates no known allergies.  Home Medications   Prior to Admission medications   Medication Sig Start Date End Date Taking? Authorizing Provider  diazepam (VALIUM) 5 MG tablet Take 1 tablet (5 mg total) by mouth 2 (two) times daily. 08/02/13   Heather Laisure, PA-C  gabapentin (NEURONTIN) 100 MG capsule Take 1 capsule (100 mg total) by mouth 3 (three) times daily. 08/13/14   Noland Fordyce, PA-C  meloxicam (MOBIC) 7.5 MG tablet Take 1 tablet (7.5 mg total) by mouth daily. 08/13/14   Noland Fordyce, PA-C  metFORMIN (GLUCOPHAGE) 500 MG tablet Take 2 tablets (1,000 mg total) by mouth 2 (two) times daily with a meal. 08/13/14   Noland Fordyce, PA-C  naproxen (NAPROSYN) 500 MG tablet Take 1 tablet (500 mg total) by mouth 2 (two) times daily. 08/02/13   Hyman Bible, PA-C  oxyCODONE-acetaminophen (PERCOCET/ROXICET) 5-325 MG per tablet Take 1-2 tablets by mouth every 6 (six) hours as needed for pain. 08/02/13   Hyman Bible, PA-C   Triage Vitals: BP 137/87 mmHg  Pulse 93  Temp(Src) 98.7 F (37.1 C) (Oral)  Resp 12  SpO2 100% Physical Exam  Constitutional: He is oriented to person, place, and time. He appears well-developed and well-nourished.  HENT:  Head: Normocephalic and atraumatic.  Eyes: EOM are normal.  Neck: Normal range of motion.  Cardiovascular: Normal rate.   Pulses:      Radial pulses are 2+ on the left side.  Capillary refill less than 2 seconds   Pulmonary/Chest: Effort normal.  Musculoskeletal: Normal range of motion.  Mild edema to L hand No deformity  Full ROM 5/5 strength  Increase pain with full fist Tenderness along the first metacarpal, first PIP and DIP joints Full ROM of wrist  Neurological: He is alert and oriented to person, place, and time.  Skin: Skin is warm and dry.  No erythema or warmth No ecchymosis  Psychiatric: He has a  normal mood and affect. His behavior is normal.  Nursing note and vitals reviewed.  ED Course  Procedures (including critical care time) DIAGNOSTIC STUDIES: Oxygen Saturation is 100% on RA, normal by my interpretation.    COORDINATION OF CARE: 1:57 PM-Discussed treatment plan which includes XR with pt at bedside and pt agreed to plan.   Labs Review Labs Reviewed  CBG MONITORING, ED - Abnormal; Notable for the following:    Glucose-Capillary 292 (*)    All other components within normal limits   Imaging Review Dg Hand Complete Left  08/13/2014   CLINICAL DATA:  left hand pain and cramping for 1 year   EXAM: LEFT HAND - COMPLETE 3+ VIEW  COMPARISON:  None.  FINDINGS: Frontal, oblique, and lateral views were obtained. There is no fracture or dislocation. Joint spaces appear intact. No erosive change.  IMPRESSION: No fracture or dislocation.  No appreciable arthropathy.   Electronically Signed   By: Lowella Grip M.D.   On: 08/13/2014 14:35    EKG Interpretation None      MDM   Final diagnoses:  Left hand pain    Pt with hx of diabetes presenting to ED with c/o left hand pain, intermittent x1 yr, however, worse yesterday. No known injuries. Hand is neurovascularly in tact.  No known injury.  Plain films: no fracture or dislocation, no appreciable arthropathy.  Will discharge home with refill of his metformin, as well as start pt on mobic and gabapentin for pain.  Advised to f/u with PCP for recheck of symptoms if not improving in 1-2 weeks. Pt verbalized understanding and agreement with tx plan.   I personally performed the services described in this documentation, which was scribed in my presence. The recorded information has been reviewed and is accurate.    Noland Fordyce, PA-C 08/13/14 [place], MD 08/14/14 1044

## 2015-01-30 ENCOUNTER — Encounter (HOSPITAL_COMMUNITY): Payer: Self-pay | Admitting: Emergency Medicine

## 2015-01-30 DIAGNOSIS — R600 Localized edema: Secondary | ICD-10-CM | POA: Insufficient documentation

## 2015-01-30 DIAGNOSIS — I10 Essential (primary) hypertension: Secondary | ICD-10-CM | POA: Insufficient documentation

## 2015-01-30 DIAGNOSIS — M25511 Pain in right shoulder: Secondary | ICD-10-CM | POA: Insufficient documentation

## 2015-01-30 DIAGNOSIS — Z79899 Other long term (current) drug therapy: Secondary | ICD-10-CM | POA: Insufficient documentation

## 2015-01-30 DIAGNOSIS — E119 Type 2 diabetes mellitus without complications: Secondary | ICD-10-CM | POA: Insufficient documentation

## 2015-01-30 DIAGNOSIS — M25512 Pain in left shoulder: Secondary | ICD-10-CM | POA: Insufficient documentation

## 2015-01-30 DIAGNOSIS — R0602 Shortness of breath: Secondary | ICD-10-CM | POA: Insufficient documentation

## 2015-01-30 DIAGNOSIS — Z791 Long term (current) use of non-steroidal anti-inflammatories (NSAID): Secondary | ICD-10-CM | POA: Insufficient documentation

## 2015-01-30 LAB — CBC WITH DIFFERENTIAL/PLATELET
Basophils Absolute: 0 10*3/uL (ref 0.0–0.1)
Basophils Relative: 0 % (ref 0–1)
EOS ABS: 0 10*3/uL (ref 0.0–0.7)
EOS PCT: 1 % (ref 0–5)
HCT: 32.7 % — ABNORMAL LOW (ref 39.0–52.0)
Hemoglobin: 12 g/dL — ABNORMAL LOW (ref 13.0–17.0)
LYMPHS PCT: 39 % (ref 12–46)
Lymphs Abs: 2.6 10*3/uL (ref 0.7–4.0)
MCH: 30.9 pg (ref 26.0–34.0)
MCHC: 36.7 g/dL — ABNORMAL HIGH (ref 30.0–36.0)
MCV: 84.3 fL (ref 78.0–100.0)
Monocytes Absolute: 0.5 10*3/uL (ref 0.1–1.0)
Monocytes Relative: 7 % (ref 3–12)
NEUTROS PCT: 53 % (ref 43–77)
Neutro Abs: 3.6 10*3/uL (ref 1.7–7.7)
PLATELETS: 260 10*3/uL (ref 150–400)
RBC: 3.88 MIL/uL — ABNORMAL LOW (ref 4.22–5.81)
RDW: 13.1 % (ref 11.5–15.5)
WBC: 6.8 10*3/uL (ref 4.0–10.5)

## 2015-01-30 LAB — COMPREHENSIVE METABOLIC PANEL
ALBUMIN: 2.9 g/dL — AB (ref 3.5–5.2)
ALT: 33 U/L (ref 0–53)
AST: 31 U/L (ref 0–37)
Alkaline Phosphatase: 107 U/L (ref 39–117)
Anion gap: 10 (ref 5–15)
BUN: 21 mg/dL (ref 6–23)
CALCIUM: 8.7 mg/dL (ref 8.4–10.5)
CHLORIDE: 104 mmol/L (ref 96–112)
CO2: 25 mmol/L (ref 19–32)
CREATININE: 1.26 mg/dL (ref 0.50–1.35)
GFR, EST AFRICAN AMERICAN: 79 mL/min — AB (ref >90)
GFR, EST NON AFRICAN AMERICAN: 68 mL/min — AB (ref >90)
Glucose, Bld: 297 mg/dL — ABNORMAL HIGH (ref 70–99)
POTASSIUM: 3.5 mmol/L (ref 3.5–5.1)
Sodium: 139 mmol/L (ref 135–145)
Total Bilirubin: 0.6 mg/dL (ref 0.3–1.2)
Total Protein: 6 g/dL (ref 6.0–8.3)

## 2015-01-30 NOTE — ED Notes (Signed)
Pt. reports progressing bilateral lower legs swelling onset 1 1/2 weeks ago , denies injury , fever or chills, respirations unlabored .

## 2015-01-31 ENCOUNTER — Emergency Department (HOSPITAL_COMMUNITY): Payer: Self-pay

## 2015-01-31 ENCOUNTER — Emergency Department (HOSPITAL_COMMUNITY)
Admission: EM | Admit: 2015-01-31 | Discharge: 2015-01-31 | Disposition: A | Discharge status: Home / Self Care | Payer: Self-pay | Attending: Emergency Medicine | Admitting: Emergency Medicine

## 2015-01-31 DIAGNOSIS — R609 Edema, unspecified: Secondary | ICD-10-CM

## 2015-01-31 DIAGNOSIS — E1165 Type 2 diabetes mellitus with hyperglycemia: Secondary | ICD-10-CM

## 2015-01-31 DIAGNOSIS — IMO0002 Reserved for concepts with insufficient information to code with codable children: Secondary | ICD-10-CM

## 2015-01-31 DIAGNOSIS — I10 Essential (primary) hypertension: Secondary | ICD-10-CM

## 2015-01-31 LAB — URINALYSIS, ROUTINE W REFLEX MICROSCOPIC
Bilirubin Urine: NEGATIVE
Glucose, UA: >1000 mg/dL — AB
Ketones, ur: NEGATIVE mg/dL
Leukocytes, UA: NEGATIVE
Nitrite: NEGATIVE
Protein, ur: >300 mg/dL — AB
Specific Gravity, Urine: 1.024 (ref 1.005–1.030)
Urobilinogen, UA: 1 mg/dL (ref 0.0–1.0)
pH: 5 (ref 5.0–8.0)

## 2015-01-31 LAB — URINE MICROSCOPIC-ADD ON

## 2015-01-31 LAB — BRAIN NATRIURETIC PEPTIDE: B NATRIURETIC PEPTIDE 5: 30.2 pg/mL (ref 0.0–100.0)

## 2015-01-31 MED ORDER — HYDROCHLOROTHIAZIDE 25 MG PO TABS: 25.0000 mg | ORAL_TABLET | Freq: Every day | ORAL | Status: DC

## 2015-01-31 MED ORDER — METFORMIN HCL 500 MG PO TABS: 1000.0000 mg | ORAL_TABLET | Freq: Two times a day (BID) | ORAL | Status: DC

## 2015-01-31 MED ORDER — FUROSEMIDE 20 MG PO TABS
20.0000 mg | ORAL_TABLET | Freq: Once | ORAL | Status: AC
  Administered 2015-01-31: 20 mg via ORAL
  Filled 2015-01-31: qty 1

## 2015-01-31 NOTE — Discharge Instructions (Signed)
It is important for you to get back on your diabetes regimen.  It was noted that you had high blood pressure in the emergency department.  You have been started on a blood pressure medicine that will also help you with the edema in your legs.  Is important for you to follow-up with your primary care doctor within the next week for recheck of your labs, and any changes in your medication that is necessary.  Return to the emergency department for worsening condition or new concerning symptoms.  Using compression stockings which can be bought at most pharmacies will help with the swelling in your legs.  Wear them for the moment you get out of bed until you get back into bed.  Elevate legs when you can.   DASH Eating Plan DASH stands for "Dietary Approaches to Stop Hypertension." The DASH eating plan is a healthy eating plan that has been shown to reduce high blood pressure (hypertension). Additional health benefits may include reducing the risk of type 2 diabetes mellitus, heart disease, and stroke. The DASH eating plan may also help with weight loss. WHAT DO I NEED TO KNOW ABOUT THE DASH EATING PLAN? For the DASH eating plan, you will follow these general guidelines:  Choose foods with a percent daily value for sodium of less than 5% (as listed on the food label).  Use salt-free seasonings or herbs instead of table salt or sea salt.  Check with your health care provider or pharmacist before using salt substitutes.  Eat lower-sodium products, often labeled as "lower sodium" or "no salt added."  Eat fresh foods.  Eat more vegetables, fruits, and low-fat dairy products.  Choose whole grains. Look for the word "whole" as the first word in the ingredient list.  Choose fish and skinless chicken or [place] more often than red meat. Limit fish, poultry, and meat to 6 oz (170 g) each day.  Limit sweets, desserts, sugars, and sugary drinks.  Choose heart-healthy fats.  Limit cheese to 1 oz (28 g) per  day.  Eat more home-cooked food and less restaurant, buffet, and fast food.  Limit fried foods.  Cook foods using methods other than frying.  Limit canned vegetables. If you do use them, rinse them well to decrease the sodium.  When eating at a restaurant, ask that your food be prepared with less salt, or no salt if possible. WHAT FOODS CAN I EAT? Seek help from a dietitian for individual calorie needs. Grains Whole grain or whole wheat bread. Brown rice. Whole grain or whole wheat pasta. Quinoa, bulgur, and whole grain cereals. Low-sodium cereals. Corn or whole wheat flour tortillas. Whole grain cornbread. Whole grain crackers. Low-sodium crackers. Vegetables Fresh or frozen vegetables (raw, steamed, roasted, or grilled). Low-sodium or reduced-sodium tomato and vegetable juices. Low-sodium or reduced-sodium tomato sauce and paste. Low-sodium or reduced-sodium canned vegetables.  Fruits All fresh, canned (in natural juice), or frozen fruits. Meat and Other Protein Products Ground beef (85% or leaner), grass-fed beef, or beef trimmed of fat. Skinless chicken or [place]. Ground chicken or [place]. Pork trimmed of fat. All fish and seafood. Eggs. Dried beans, peas, or lentils. Unsalted nuts and seeds. Unsalted canned beans. Dairy Low-fat dairy products, such as skim or 1% milk, 2% or reduced-fat cheeses, low-fat ricotta or cottage cheese, or plain low-fat yogurt. Low-sodium or reduced-sodium cheeses. Fats and Oils Tub margarines without trans fats. Light or reduced-fat mayonnaise and salad dressings (reduced sodium). Avocado. Safflower, olive, or canola oils. Natural peanut or  almond butter. Other Unsalted popcorn and pretzels. The items listed above may not be a complete list of recommended foods or beverages. Contact your dietitian for more options. WHAT FOODS ARE NOT RECOMMENDED? Grains White bread. White pasta. White rice. Refined cornbread. Bagels and croissants. Crackers that contain  trans fat. Vegetables Creamed or fried vegetables. Vegetables in a cheese sauce. Regular canned vegetables. Regular canned tomato sauce and paste. Regular tomato and vegetable juices. Fruits Dried fruits. Canned fruit in light or heavy syrup. Fruit juice. Meat and Other Protein Products Fatty cuts of meat. Ribs, chicken wings, bacon, sausage, bologna, salami, chitterlings, fatback, hot dogs, bratwurst, and packaged luncheon meats. Salted nuts and seeds. Canned beans with salt. Dairy Whole or 2% milk, cream, half-and-half, and cream cheese. Whole-fat or sweetened yogurt. Full-fat cheeses or blue cheese. Nondairy creamers and whipped toppings. Processed cheese, cheese spreads, or cheese curds. Condiments Onion and garlic salt, seasoned salt, table salt, and sea salt. Canned and packaged gravies. Worcestershire sauce. Tartar sauce. Barbecue sauce. Teriyaki sauce. Soy sauce, including reduced sodium. Steak sauce. Fish sauce. Oyster sauce. Cocktail sauce. Horseradish. Ketchup and mustard. Meat flavorings and tenderizers. Bouillon cubes. Hot sauce. Tabasco sauce. Marinades. Taco seasonings. Relishes. Fats and Oils Butter, stick margarine, lard, shortening, ghee, and bacon fat. Coconut, palm kernel, or palm oils. Regular salad dressings. Other Pickles and olives. Salted popcorn and pretzels. The items listed above may not be a complete list of foods and beverages to avoid. Contact your dietitian for more information. WHERE CAN I FIND MORE INFORMATION? National Heart, [employer], [employer] Institute: travelstabloid.com Document Released: 09/08/2011 Document Revised: 02/03/2014 Document Reviewed: 07/24/2013 [facility] Patient Information 2015 [place], [place]. This information is not intended to replace advice given to you by your health care provider. Make sure you discuss any questions you have with your health care provider.  Edema Edema is an abnormal buildup of fluids in  your bodytissues. Edema is somewhatdependent on gravity to pull the fluid to the lowest place in your body. That makes the condition more common in the legs and thighs (lower extremities). Painless swelling of the feet and ankles is common and becomes more likely as you get older. It is also common in looser tissues, like around your eyes.  When the affected area is squeezed, the fluid may move out of that spot and leave a dent for a few moments. This dent is called pitting.  CAUSES  There are many possible causes of edema. Eating too much salt and being on your feet or sitting for a long time can cause edema in your legs and ankles. Hot weather may make edema worse. Common medical causes of edema include:  Heart failure.  Liver disease.  Kidney disease.  Weak blood vessels in your legs.  Cancer.  An injury.  Pregnancy.  Some medications.  Obesity. SYMPTOMS  Edema is usually painless.Your skin may look swollen or shiny.  DIAGNOSIS  Your health care provider may be able to diagnose edema by asking about your medical history and doing a physical exam. You may need to have tests such as X-rays, an electrocardiogram, or blood tests to check for medical conditions that may cause edema.  TREATMENT  Edema treatment depends on the cause. If you have heart, liver, or kidney disease, you need the treatment appropriate for these conditions. General treatment may include:  Elevation of the affected body part above the level of your heart.  Compression of the affected body part. Pressure from elastic bandages or support stockings  squeezes the tissues and forces fluid back into the blood vessels. This keeps fluid from entering the tissues.  Restriction of fluid and salt intake.  Use of a water pill (diuretic). These medications are appropriate only for some types of edema. They pull fluid out of your body and make you urinate more often. This gets rid of fluid and reduces swelling, but  diuretics can have side effects. Only use diuretics as directed by your health care provider. HOME CARE INSTRUCTIONS   Keep the affected body part above the level of your heart when you are lying down.   Do not sit still or stand for prolonged periods.   Do not put anything directly under your knees when lying down.  Do not wear constricting clothing or garters on your upper legs.   Exercise your legs to work the fluid back into your blood vessels. This may help the swelling go down.   Wear elastic bandages or support stockings to reduce ankle swelling as directed by your health care provider.   Eat a low-salt diet to reduce fluid if your health care provider recommends it.   Only take medicines as directed by your health care provider. SEEK MEDICAL CARE IF:   Your edema is not responding to treatment.  You have heart, liver, or kidney disease and notice symptoms of edema.  You have edema in your legs that does not improve after elevating them.   You have sudden and unexplained weight gain. SEEK IMMEDIATE MEDICAL CARE IF:   You develop shortness of breath or chest pain.   You cannot breathe when you lie down.  You develop pain, redness, or warmth in the swollen areas.   You have heart, liver, or kidney disease and suddenly get edema.  You have a fever and your symptoms suddenly get worse. MAKE SURE YOU:   Understand these instructions.  Will watch your condition.  Will get help right away if you are not doing well or get worse. Document Released: 09/19/2005 Document Revised: 02/03/2014 Document Reviewed: 07/12/2013 [facility] Patient Information 2015 [place], [place]. This information is not intended to replace advice given to you by your health care provider. Make sure you discuss any questions you have with your health care provider.  How to Avoid Diabetes Problems You can do a lot to prevent or slow down diabetes problems. Following your diabetes plan and  taking care of yourself can reduce your risk of serious or life-threatening complications. Below, you will find certain things you can do to prevent diabetes problems. MANAGE YOUR DIABETES Follow your health care provider's, nurse educator's, and dietitian's instructions for managing your diabetes. They will teach you the basics of diabetes care. They can help answer questions you may have. Learn about diabetes and make healthy choices regarding eating and physical activity. Monitor your blood glucose level regularly. Your health care provider will help you decide how often to check your blood glucose level depending on your treatment goals and how well you are meeting them.  DO NOT USE NICOTINE Nicotine and diabetes are a dangerous combination. Nicotine raises your risk for diabetes problems. If you quit using nicotine, you will lower your risk for heart attack, stroke, nerve disease, and kidney disease. Your cholesterol and your blood pressure levels may improve. Your blood circulation will also improve. Do not use any tobacco products, including cigarettes, chewing tobacco, or electronic cigarettes. If you need help quitting, ask your health care provider. KEEP YOUR BLOOD PRESSURE UNDER CONTROL Keeping your blood  pressure under control will help prevent damage to your eyes, kidneys, heart, and blood vessels. Blood pressure consists of two numbers. The top number should be below 120, and the bottom number should be below 80 (120/80). Keep your blood pressure as close to these numbers as you can. If you already have kidney disease, you may want even lower blood pressure to protect your kidneys. Talk to your health care provider to make sure that your blood pressure goal is right for your needs. Meal planning, medicines, and exercise can help you reach your blood pressure target. Have your blood pressure checked at every visit with your health care provider. KEEP YOUR CHOLESTEROL UNDER CONTROL Normal  cholesterol levels will help prevent heart disease and stroke. These are the biggest health problems for people with diabetes. Keeping cholesterol levels under control can also help with blood flow. Have your cholesterol level checked at least once a year. Your health care provider may prescribe a medicine known as a statin. Statins lower your cholesterol. If you are not taking a statin, ask your health care provider if you should be. Meal planning, exercise, and medicines can help you reach your cholesterol targets.  SCHEDULE AND KEEP YOUR ANNUAL PHYSICAL EXAMS AND EYE EXAMS Your health care provider will tell you how often he or she wants to see you depending on your plan of treatment. It is important that you keep these appointments so that possible problems can be identified early and complications can be avoided or treated.  Every visit with your health care provider should include your weight, blood pressure, and an evaluation of your blood glucose control.  Your hemoglobin A1c should be checked:  At least twice a year if you are at your goal.  Every 3 months if there are changes in treatment.  If you are not meeting your goals.  Your blood lipids should be checked yearly. You should also be checked yearly to see if you have protein in your urine (microalbumin).  Schedule a dilated eye exam within 5 years of your diagnosis if you have type 1 diabetes, and then yearly. Schedule a dilated eye exam at diagnosis if you have type 2 diabetes, and then yearly. All exams thereafter can be extended to every 2 to 3 years if one or more exams have been normal. KEEP YOUR VACCINES CURRENT The flu vaccine is recommended yearly. The formula for the vaccine changes every year and needs to be updated for the best protection against current viruses. It is recommended that people with diabetes who are over 75 years old get the pneumonia vaccine. In some cases, two separate shots may be given. Ask your health  care provider if your pneumonia vaccination is up-to-date. However, there are some instances where another vaccine is recommended. Check with your health care provider. TAKE CARE OF YOUR FEET  Diabetes may cause you to have a poor blood supply (circulation) to your legs and feet. Because of this, the skin may be thinner, break easier, and heal more slowly. You also may have nerve damage in your legs and feet, causing decreased feeling. You may not notice minor injuries to your feet that could lead to serious problems or infections. Taking care of your feet is very important. Visual foot exams are performed at every routine medical visit. The exams check for cuts, injuries, or other problems with the feet. A comprehensive foot exam should be done yearly. This includes visual inspection as well as assessing foot pulses and testing for  loss of sensation. You should also do the following:  Inspect your feet daily for cuts, calluses, blisters, ingrown toenails, and signs of infection, such as redness, swelling, or pus.  Wash and dry your feet thoroughly, especially between the toes.  Avoid soaking your feet regularly in hot water baths.  Moisturize dry skin with lotion, avoiding areas between your toes.  Cut toenails straight across and file the edges.  Avoid shoes that do not fit well or have areas that irritate your skin.  Avoid going barefooted or wearing only socks. Your feet need protection. TAKE CARE OF YOUR TEETH People with poorly controlled diabetes are more likely to have gum (periodontal) disease. These infections make diabetes harder to control. Periodontal diseases, if left untreated, can lead to tooth loss. Brush your teeth twice a day, floss, and see your dentist for checkups and cleaning every 6 months, or 2 times a year. ASK YOUR HEALTH CARE PROVIDER ABOUT TAKING ASPIRIN Taking aspirin daily is recommended to help prevent cardiovascular disease in people with and without diabetes.  Ask your health care provider if this would benefit you and what dose he or she would recommend. DRINK RESPONSIBLY Moderate amounts of alcohol (less than 1 drink per day for adult women and less than 2 drinks per day for adult men) have a minimal effect on blood glucose if ingested with food. It is important to eat food with alcohol to avoid hypoglycemia. People should avoid alcohol if they have a history of alcohol abuse or dependence, if they are pregnant, and if they have liver disease, pancreatitis, advanced neuropathy, or severe hypertriglyceridemia. LESSEN STRESS Living with diabetes can be stressful. When you are under stress, your blood glucose may be affected in two ways:  Stress hormones may cause your blood glucose to rise.  You may be distracted from taking good care of yourself. It is a good idea to be aware of your stress level and make changes that are necessary to help you better manage challenging situations. Support groups, planned relaxation, a hobby you enjoy, meditation, healthy relationships, and exercise all work to lower your stress level. If your efforts do not seem to be helping, get help from your health care provider or a trained mental health professional. Document Released: 06/07/2011 Document Revised: 02/03/2014 Document Reviewed: 11/13/2013 [facility] Patient Information 2015 [place], [place]. This information is not intended to replace advice given to you by your health care provider. Make sure you discuss any questions you have with your health care provider.  Managing Your High Blood Pressure Blood pressure is a measurement of how forceful your blood is pressing against the walls of the arteries. Arteries are muscular tubes within the circulatory system. Blood pressure does not stay the same. Blood pressure rises when you are active, excited, or nervous; and it lowers during sleep and relaxation. If the numbers measuring your blood pressure stay above normal most of the  time, you are at risk for health problems. High blood pressure (hypertension) is a long-term (chronic) condition in which blood pressure is elevated. A blood pressure reading is recorded as two numbers, such as 120 over 80 (or 120/80). The first, higher number is called the systolic pressure. It is a measure of the pressure in your arteries as the heart beats. The second, lower number is called the diastolic pressure. It is a measure of the pressure in your arteries as the heart relaxes between beats.  Keeping your blood pressure in a normal range is important to your  overall health and prevention of health problems, such as heart disease and stroke. When your blood pressure is uncontrolled, your heart has to work harder than normal. High blood pressure is a very common condition in adults because blood pressure tends to rise with age. Men and women are equally likely to have hypertension but at different times in life. Before age 58, men are more likely to have hypertension. After 44 years of age, women are more likely to have it. Hypertension is especially common in African Americans. This condition often has no signs or symptoms. The cause of the condition is usually not known. Your caregiver can help you come up with a plan to keep your blood pressure in a normal, healthy range. BLOOD PRESSURE STAGES Blood pressure is classified into four stages: normal, prehypertension, stage 1, and stage 2. Your blood pressure reading will be used to determine what type of treatment, if any, is necessary. Appropriate treatment options are tied to these four stages:  Normal  Systolic pressure (mm Hg): below 120.  Diastolic pressure (mm Hg): below 80. Prehypertension  Systolic pressure (mm Hg): 120 to 139.  Diastolic pressure (mm Hg): 80 to 89. Stage1  Systolic pressure (mm Hg): 140 to 159.  Diastolic pressure (mm Hg): 90 to 99. Stage2  Systolic pressure (mm Hg): 160 or above.  Diastolic pressure (mm  Hg): 100 or above. RISKS RELATED TO HIGH BLOOD PRESSURE Managing your blood pressure is an important responsibility. Uncontrolled high blood pressure can lead to:  A heart attack.  A stroke.  A weakened blood vessel (aneurysm).  Heart failure.  Kidney damage.  Eye damage.  Metabolic syndrome.  Memory and concentration problems. HOW TO MANAGE YOUR BLOOD PRESSURE Blood pressure can be managed effectively with lifestyle changes and medicines (if needed). Your caregiver will help you come up with a plan to bring your blood pressure within a normal range. Your plan should include the following: Education  Read all information provided by your caregivers about how to control blood pressure.  Educate yourself on the latest guidelines and treatment recommendations. New research is always being done to further define the risks and treatments for high blood pressure. Lifestylechanges  Control your weight.  Avoid smoking.  Stay physically active.  Reduce the amount of salt in your diet.  Reduce stress.  Control any chronic conditions, such as high cholesterol or diabetes.  Reduce your alcohol intake. Medicines  Several medicines (antihypertensive medicines) are available, if needed, to bring blood pressure within a normal range. Communication  Review all the medicines you take with your caregiver because there may be side effects or interactions.  Talk with your caregiver about your diet, exercise habits, and other lifestyle factors that may be contributing to high blood pressure.  See your caregiver regularly. Your caregiver can help you create and adjust your plan for managing high blood pressure. RECOMMENDATIONS FOR TREATMENT AND FOLLOW-UP  The following recommendations are based on current guidelines for managing high blood pressure in nonpregnant adults. Use these recommendations to identify the proper follow-up period or treatment option based on your blood pressure  reading. You can discuss these options with your caregiver.  Systolic pressure of 123456 to [id number] or diastolic pressure of 80 to 89: Follow up with your caregiver as directed.  Systolic pressure of [id number] to 0000000 or diastolic pressure of 90 to 100: Follow up with your caregiver within 2 months.  Systolic pressure above 0000000 or diastolic pressure above [id number]: Follow up with your caregiver  within 1 month.  Systolic pressure above [id number] or diastolic pressure above [id number]: Consider antihypertensive therapy; follow up with your caregiver within 1 week.  Systolic pressure above [id number] or diastolic pressure above 123456: Begin antihypertensive therapy; follow up with your caregiver within 1 week. Document Released: 06/13/2012 Document Reviewed: 06/13/2012 [facility] Patient Information 2015 [place]. This information is not intended to replace advice given to you by your health care provider. Make sure you discuss any questions you have with your health care provider.

## 2015-01-31 NOTE — ED Notes (Signed)
MD at bedside. 

## 2015-01-31 NOTE — ED Provider Notes (Signed)
CSN: [id number]     Arrival date & time 01/30/15  2213 History  This chart was scribe for Linton Flemings, MD by Judithann Sauger, ED Scribe. The patient was seen in room A11C/A11C and the patient's care was started at 12:08 AM.    Chief Complaint  Patient presents with  . Leg Swelling   The history is provided by the patient. No language interpreter was used.   HPI Comments: Edward [name] is a 44 y.o. male with a hx of Type II DM who presents to the Emergency Department complaining of bilateral lower leg swelling onset a week and a half ago. He reports that the blister on his left leg was onset 2-3 days ago and he adds that his right leg has resolved a little. He reports associated SOB. He denies CP or a hx of kidney problems. He reports that he is supposed to take MetFormin for his Type 2 but he has been non-compliant and is unsure the last time he checked his blood glucose level. He reports that he does a lot of walking and standing on his feet all day at work. He reports that he normally sleeps with multiple pillows. He also reports bilateral shoulder pain especailly when he lifts up his arm.  Past Medical History  Diagnosis Date  . Diabetes mellitus    Past Surgical History  Procedure Laterality Date  . Eye surgery     Family History  Problem Relation Age of Onset  . Hypertension Mother   . Diabetes Mother    History  Substance Use Topics  . Smoking status: Never Smoker   . Smokeless tobacco: Not on file  . Alcohol Use: Yes     Comment: occasional    Review of Systems  Constitutional: Negative for fever and chills.  Respiratory: Positive for shortness of breath.   Cardiovascular: Negative for chest pain.  Musculoskeletal: Positive for joint swelling and arthralgias (bilateral shoulder).      Allergies  Review of patient's allergies indicates no known allergies.  Home Medications   Prior to Admission medications   Medication Sig Start Date End Date Taking?  Authorizing Provider  diazepam (VALIUM) 5 MG tablet Take 1 tablet (5 mg total) by mouth 2 (two) times daily. 08/02/13   Heather Laisure, PA-C  gabapentin (NEURONTIN) 100 MG capsule Take 1 capsule (100 mg total) by mouth 3 (three) times daily. 08/13/14   Noland Fordyce, PA-C  meloxicam (MOBIC) 7.5 MG tablet Take 1 tablet (7.5 mg total) by mouth daily. 08/13/14   Noland Fordyce, PA-C  metFORMIN (GLUCOPHAGE) 500 MG tablet Take 2 tablets (1,000 mg total) by mouth 2 (two) times daily with a meal. 08/13/14   Noland Fordyce, PA-C  naproxen (NAPROSYN) 500 MG tablet Take 1 tablet (500 mg total) by mouth 2 (two) times daily. 08/02/13   Hyman Bible, PA-C  oxyCODONE-acetaminophen (PERCOCET/ROXICET) 5-325 MG per tablet Take 1-2 tablets by mouth every 6 (six) hours as needed for pain. 08/02/13   Heather Laisure, PA-C   BP 156/101 mmHg  Pulse 98  Temp(Src) 98.3 F (36.8 C) (Oral)  Resp 14  SpO2 100% Physical Exam  Constitutional: He is oriented to person, place, and time. He appears well-developed and well-nourished. No distress.  HENT:  Head: Normocephalic and atraumatic.  Nose: Nose normal.  Mouth/Throat: Oropharynx is clear and moist.  Eyes: Conjunctivae and EOM are normal. Pupils are equal, round, and reactive to light.  Neck: Normal range of motion. Neck supple. No JVD present. No  tracheal deviation present. No thyromegaly present.  Cardiovascular: Normal rate, regular rhythm, normal heart sounds and intact distal pulses.  Exam reveals no gallop and no friction rub.   No murmur heard. Pulmonary/Chest: Effort normal and breath sounds normal. No stridor. No respiratory distress. He has no wheezes. He has no rales. He exhibits no tenderness.  Abdominal: Soft. Bowel sounds are normal. He exhibits no distension and no mass. There is no tenderness. There is no rebound and no guarding.  Musculoskeletal: Normal range of motion. He exhibits edema. He exhibits no tenderness.  2+ pitting edema, fluid-filled  bulla on left anterior shin  Lymphadenopathy:    He has no cervical adenopathy.  Neurological: He is alert and oriented to person, place, and time. He displays normal reflexes. He exhibits normal muscle tone. Coordination normal.  Skin: Skin is warm and dry. No rash noted. No erythema. No pallor.  Psychiatric: He has a normal mood and affect. His behavior is normal. Judgment and thought content normal.  Nursing note and vitals reviewed.   ED Course  Procedures (including critical care time) DIAGNOSTIC STUDIES: Oxygen Saturation is 100% on RA, normal by my interpretation.    COORDINATION OF CARE: 12:15 AM- Pt advised of plan for treatment and pt agrees.    Labs Review Labs Reviewed  CBC WITH DIFFERENTIAL/PLATELET - Abnormal; Notable for the following:    RBC 3.88 (*)    Hemoglobin 12.0 (*)    HCT 32.7 (*)    MCHC 36.7 (*)    All other components within normal limits  COMPREHENSIVE METABOLIC PANEL - Abnormal; Notable for the following:    Glucose, Bld 297 (*)    Albumin 2.9 (*)    GFR calc non Af Amer 68 (*)    GFR calc Af Amer 79 (*)    All other components within normal limits  URINALYSIS, ROUTINE W REFLEX MICROSCOPIC - Abnormal; Notable for the following:    Glucose, UA >1000 (*)    Hgb urine dipstick MODERATE (*)    Protein, ur >300 (*)    All other components within normal limits  URINE MICROSCOPIC-ADD ON - Abnormal; Notable for the following:    Casts HYALINE CASTS (*)    All other components within normal limits  BRAIN NATRIURETIC PEPTIDE    Imaging Review Dg Chest 2 View  01/31/2015   CLINICAL DATA:  Bilateral lower extremity edema.  Diabetes.  EXAM: CHEST  2 VIEW  COMPARISON:  11/23/2005  FINDINGS: Midline trachea. Normal heart size and mediastinal contours.Sharp costophrenic angles. No pneumothorax. Clear lungs.  IMPRESSION: No active cardiopulmonary disease.   Electronically Signed   By: Abigail Miyamoto M.D.   On: 01/31/2015 01:11     EKG  Interpretation   Date/Time:  Saturday January 31 2015 00:19:39 EDT Ventricular Rate:  82 PR Interval:  171 QRS Duration: 90 QT Interval:  368 QTC Calculation: 430 R Axis:   9 Text Interpretation:  Sinus rhythm Borderline T abnormalities, lateral  leads No old tracing to compare Confirmed by Kacey Vicuna  MD, Keyonta Barradas ([id number]) on  01/31/2015 12:48:59 AM      MDM   Final diagnoses:  Diabetes mellitus type II, uncontrolled  Essential hypertension  Peripheral edema   44 year old male with history of diabetes who is noncompliant with medication presents with 1-1/2 weeks of lower extremity swelling.  Patient without signs of fluid overload.  Elsewhere, no crackles in the lungs, no history of congestive heart failure.  Albumin is low, blood glucose is high.  Patient stands on his feet for most of the day due to his job.  Patient noted be hypertensive.  Plan for chest x-ray and BNP to help with the workup.  Patient has been informed that he will need follow-up with primary care Dr. and restarted on his medications.  I personally performed the services described in this documentation, which was scribed in my presence. The recorded information has been reviewed and is accurate.    Linton Flemings, MD 01/31/15 [phone]

## 2015-02-13 ENCOUNTER — Encounter (HOSPITAL_COMMUNITY): Payer: Self-pay | Admitting: Emergency Medicine

## 2015-02-13 ENCOUNTER — Emergency Department (HOSPITAL_COMMUNITY)
Admission: EM | Admit: 2015-02-13 | Discharge: 2015-02-13 | Disposition: A | Discharge status: Home / Self Care | Payer: No Typology Code available for payment source | Attending: Emergency Medicine | Admitting: Emergency Medicine

## 2015-02-13 DIAGNOSIS — Z79899 Other long term (current) drug therapy: Secondary | ICD-10-CM | POA: Diagnosis not present

## 2015-02-13 DIAGNOSIS — R2240 Localized swelling, mass and lump, unspecified lower limb: Secondary | ICD-10-CM | POA: Insufficient documentation

## 2015-02-13 DIAGNOSIS — E119 Type 2 diabetes mellitus without complications: Secondary | ICD-10-CM | POA: Diagnosis not present

## 2015-02-13 DIAGNOSIS — R609 Edema, unspecified: Secondary | ICD-10-CM

## 2015-02-13 LAB — CBG MONITORING, ED: GLUCOSE-CAPILLARY: 271 mg/dL — AB (ref 65–99)

## 2015-02-13 LAB — I-STAT CHEM 8, ED
BUN: 19 mg/dL (ref 6–20)
CALCIUM ION: 1.18 mmol/L (ref 1.12–1.23)
CHLORIDE: 105 mmol/L (ref 101–111)
CREATININE: 1 mg/dL (ref 0.61–1.24)
GLUCOSE: 264 mg/dL — AB (ref 65–99)
HEMATOCRIT: 32 % — AB (ref 39.0–52.0)
HEMOGLOBIN: 10.9 g/dL — AB (ref 13.0–17.0)
POTASSIUM: 3.8 mmol/L (ref 3.5–5.1)
Sodium: 138 mmol/L (ref 135–145)
TCO2: 20 mmol/L (ref 0–100)

## 2015-02-13 MED ORDER — POTASSIUM CHLORIDE ER 20 MEQ PO TBCR: 10.0000 meq | EXTENDED_RELEASE_TABLET | Freq: Two times a day (BID) | ORAL | Status: DC

## 2015-02-13 MED ORDER — FUROSEMIDE 20 MG PO TABS: 40.0000 mg | ORAL_TABLET | Freq: Every day | ORAL | Status: DC

## 2015-02-13 MED ORDER — METOPROLOL TARTRATE 25 MG PO TABS: 100.0000 mg | ORAL_TABLET | Freq: Two times a day (BID) | ORAL | Status: DC

## 2015-02-13 MED ORDER — METOPROLOL TARTRATE 100 MG PO TABS: 100.0000 mg | ORAL_TABLET | Freq: Two times a day (BID) | ORAL | Status: DC

## 2015-02-13 MED ORDER — FUROSEMIDE 20 MG PO TABS: 20.0000 mg | ORAL_TABLET | Freq: Every day | ORAL | Status: DC

## 2015-02-13 NOTE — Discharge Instructions (Signed)
Edema  Edema is an abnormal buildup of fluids. It is more common in your legs and thighs. Painless swelling of the feet and ankles is more likely as a person ages. It also is common in looser skin, like around your eyes.  HOME CARE   · Keep the affected body part above the level of the heart while lying down.  · Do not sit still or stand for a long time.  · Do not put anything right under your knees when you lie down.  · Do not wear tight clothes on your upper legs.  · Exercise your legs to help the puffiness (swelling) go down.  · Wear elastic bandages or support stockings as told by your doctor.  · A low-salt diet may help lessen the puffiness.  · Only take medicine as told by your doctor.  GET HELP IF:  · Treatment is not working.  · You have heart, liver, or kidney disease and notice that your skin looks puffy or shiny.  · You have puffiness in your legs that does not get better when you raise your legs.  · You have sudden weight gain for no reason.  GET HELP RIGHT AWAY IF:   · You have shortness of breath or chest pain.  · You cannot breathe when you lie down.  · You have pain, redness, or warmth in the areas that are puffy.  · You have heart, liver, or kidney disease and get edema all of a sudden.  · You have a fever and your symptoms get worse all of a sudden.  MAKE SURE YOU:   · Understand these instructions.  · Will watch your condition.  · Will get help right away if you are not doing well or get worse.  Document Released: 03/07/2008 Document Revised: 09/24/2013 Document Reviewed: 07/12/2013  [employer]® Patient Information ©2015 [employer]. This information is not intended to replace advice given to you by your health care provider. Make sure you discuss any questions you have with your health care provider.

## 2015-02-13 NOTE — ED Notes (Signed)
Leg swelling with small sores and purulent drainage at times, bilateral.  Pain with ambulation, ongoing for 1 month, denies fever or chills, vss.  Afebrile.

## 2015-02-13 NOTE — ED Provider Notes (Signed)
CSN: [id number]     Arrival date & time 02/13/15  0501 History   First MD Initiated Contact with Patient 02/13/15 [phone]     Chief Complaint  Patient presents with  . Leg Swelling      HPI  His original evaluation of leg swelling. Seen about 10 days ago. Had normal renal function. Given her thiazide and Glucophage prescriptions. His renal function was normal/baseline. Doses medication for some time. Continues lower extremity edema.  Only had some fluid-filled bullae at that time. These resolved. No fevers or chills or redness or pain.  Past Medical History  Diagnosis Date  . Diabetes mellitus    Past Surgical History  Procedure Laterality Date  . Eye surgery     Family History  Problem Relation Age of Onset  . Hypertension Mother   . Diabetes Mother    History  Substance Use Topics  . Smoking status: Never Smoker   . Smokeless tobacco: Not on file  . Alcohol Use: Yes     Comment: occasional    Review of Systems  Constitutional: Negative for fever, chills, diaphoresis, appetite change and fatigue.  HENT: Negative for mouth sores, sore throat and trouble swallowing.   Eyes: Negative for visual disturbance.  Respiratory: Negative for cough, chest tightness, shortness of breath and wheezing.   Cardiovascular: Positive for leg swelling. Negative for chest pain.  Gastrointestinal: Negative for nausea, vomiting, abdominal pain, diarrhea and abdominal distention.  Endocrine: Negative for polydipsia, polyphagia and polyuria.  Genitourinary: Negative for dysuria, frequency and hematuria.  Musculoskeletal: Negative for gait problem.  Skin: Negative for color change, pallor and rash.  Neurological: Negative for dizziness, syncope, light-headedness and headaches.  Hematological: Does not bruise/bleed easily.  Psychiatric/Behavioral: Negative for behavioral problems and confusion.      Allergies  Review of patient's allergies indicates no known allergies.  Home Medications    Prior to Admission medications   Medication Sig Start Date End Date Taking? Authorizing Provider  hydrochlorothiazide (HYDRODIURIL) 25 MG tablet Take 1 tablet (25 mg total) by mouth daily. 01/31/15  Yes Linton Flemings, MD  metFORMIN (GLUCOPHAGE) 500 MG tablet Take 2 tablets (1,000 mg total) by mouth 2 (two) times daily with a meal. 01/31/15  Yes Linton Flemings, MD  furosemide (LASIX) 20 MG tablet Take 1 tablet (20 mg total) by mouth daily. 02/13/15   Tanna Furry, MD  furosemide (LASIX) 20 MG tablet Take 2 tablets (40 mg total) by mouth daily. 02/13/15   Tanna Furry, MD  metoprolol (LOPRESSOR) 100 MG tablet Take 1 tablet (100 mg total) by mouth 2 (two) times daily. 02/13/15   Tanna Furry, MD  metoprolol (LOPRESSOR) 25 MG tablet Take 4 tablets (100 mg total) by mouth 2 (two) times daily. 02/13/15   Tanna Furry, MD  potassium chloride 20 MEQ TBCR Take 10 mEq by mouth 2 (two) times daily. 02/13/15   Tanna Furry, MD   BP 156/97 mmHg  Pulse 80  Temp(Src) 97.9 F (36.6 C) (Oral)  Resp 18  Ht 6' (1.829 m)  Wt 220 lb (99.791 kg)  BMI 29.83 kg/m2  SpO2 100% Physical Exam  Constitutional: He is oriented to person, place, and time. He appears well-developed and well-nourished. No distress.  HENT:  Head: Normocephalic.  Eyes: Conjunctivae are normal. Pupils are equal, round, and reactive to light. No scleral icterus.  Neck: Normal range of motion. Neck supple. No thyromegaly present.  Cardiovascular: Normal rate and regular rhythm.  Exam reveals no gallop and no friction rub.  No murmur heard. Pulmonary/Chest: Effort normal and breath sounds normal. No respiratory distress. He has no wheezes. He has no rales.  Abdominal: Soft. Bowel sounds are normal. He exhibits no distension. There is no tenderness. There is no rebound.  Musculoskeletal: Normal range of motion.  Symmetric lower extremity 2+ edema. No erythema or warmth. Ruptured bullae.  Neurological: He is alert and oriented to person, place, and time.  Skin:  Skin is warm and dry. No rash noted.  Psychiatric: He has a normal mood and affect. His behavior is normal.    ED Course  Procedures (including critical care time) Labs Review Labs Reviewed  I-STAT CHEM 8, ED - Abnormal; Notable for the following:    Glucose, Bld 264 (*)    Hemoglobin 10.9 (*)    HCT 32.0 (*)    All other components within normal limits  CBG MONITORING, ED - Abnormal; Notable for the following:    Glucose-Capillary 271 (*)    All other components within normal limits    Imaging Review No results found.   EKG Interpretation None      MDM   Final diagnoses:  Edema    Patient's renal function is stable. Plan will be a 6 and potassium 10 days. He'll hold his hydrochlorothiazide until he has completed a Lasix minimal resume. Left Lopressor for his blood pressure control. He states that he has a prescription currently for his Glucophage.    Tanna Furry, MD 02/13/15 [phone]

## 2015-04-23 ENCOUNTER — Emergency Department (HOSPITAL_COMMUNITY)
Admission: EM | Admit: 2015-04-23 | Discharge: 2015-04-23 | Disposition: A | Discharge status: Home / Self Care | Payer: No Typology Code available for payment source | Attending: Emergency Medicine | Admitting: Emergency Medicine

## 2015-04-23 ENCOUNTER — Encounter (HOSPITAL_COMMUNITY): Payer: Self-pay | Admitting: *Deleted

## 2015-04-23 DIAGNOSIS — Z79899 Other long term (current) drug therapy: Secondary | ICD-10-CM | POA: Insufficient documentation

## 2015-04-23 DIAGNOSIS — E119 Type 2 diabetes mellitus without complications: Secondary | ICD-10-CM | POA: Insufficient documentation

## 2015-04-23 DIAGNOSIS — M545 Low back pain, unspecified: Secondary | ICD-10-CM

## 2015-04-23 MED ORDER — TRAMADOL HCL 50 MG PO TABS
50.0000 mg | ORAL_TABLET | Freq: Four times a day (QID) | ORAL | Status: DC | PRN
Start: 2015-04-23 — End: 2015-04-28

## 2015-04-23 MED ORDER — CYCLOBENZAPRINE HCL 10 MG PO TABS: 10.0000 mg | ORAL_TABLET | Freq: Two times a day (BID) | ORAL | Status: DC | PRN

## 2015-04-23 NOTE — ED Provider Notes (Signed)
CSN: [id number]     Arrival date & time 04/23/15  2255 History  This chart was scribed for non-physician practitioner, Glendell Docker, FNP,working with Varney Biles, MD, by Marlowe Kays, ED Scribe. This patient was seen in room TR06C/TR06C and the patient's care was started at 11:12 PM.  Chief Complaint  Patient presents with  . Back Pain   The history is provided by the patient and medical records. No language interpreter was used.    HPI Comments:  Edward [name] is a 44 y.o. male who presents to the Emergency Department complaining of severe low back pain that is worse on the left side that began a couple of days ago. He states he used to get cortisone injections in his back but has not in a while due to the cost. He has not been taking anything to treat his pain. Movement and walking makes the pain worse. He denies alleviating factors. He denies fever, chills, nausea, vomiting, abdominal pain, numbness, tingling or weakness of the lower extremities, bowel or bladder incontinence, bruising or wounds. He denies any trauma, injury or fall.   Past Medical History  Diagnosis Date  . Diabetes mellitus    Past Surgical History  Procedure Laterality Date  . Eye surgery     Family History  Problem Relation Age of Onset  . Hypertension Mother   . Diabetes Mother    History  Substance Use Topics  . Smoking status: Never Smoker   . Smokeless tobacco: Not on file  . Alcohol Use: Yes     Comment: occasional    Review of Systems  Constitutional: Negative for fever and chills.  Gastrointestinal: Negative for nausea, vomiting and abdominal pain.  Genitourinary:       No bowel or bladder incontinence  Musculoskeletal: Positive for back pain.  Skin: Negative for color change and wound.  Neurological: Negative for weakness and numbness.  All other systems reviewed and are negative.   Allergies  Review of patient's allergies indicates no known allergies.  Home Medications    Prior to Admission medications   Medication Sig Start Date End Date Taking? Authorizing Provider  cyclobenzaprine (FLEXERIL) 10 MG tablet Take 1 tablet (10 mg total) by mouth 2 (two) times daily as needed for muscle spasms. 04/23/15   Glendell Docker, NP  furosemide (LASIX) 20 MG tablet Take 1 tablet (20 mg total) by mouth daily. 02/13/15   Tanna Furry, MD  furosemide (LASIX) 20 MG tablet Take 2 tablets (40 mg total) by mouth daily. 02/13/15   Tanna Furry, MD  hydrochlorothiazide (HYDRODIURIL) 25 MG tablet Take 1 tablet (25 mg total) by mouth daily. 01/31/15   Linton Flemings, MD  metFORMIN (GLUCOPHAGE) 500 MG tablet Take 2 tablets (1,000 mg total) by mouth 2 (two) times daily with a meal. 01/31/15   Linton Flemings, MD  metoprolol (LOPRESSOR) 100 MG tablet Take 1 tablet (100 mg total) by mouth 2 (two) times daily. 02/13/15   Tanna Furry, MD  metoprolol (LOPRESSOR) 25 MG tablet Take 4 tablets (100 mg total) by mouth 2 (two) times daily. 02/13/15   Tanna Furry, MD  potassium chloride 20 MEQ TBCR Take 10 mEq by mouth 2 (two) times daily. 02/13/15   Tanna Furry, MD  traMADol (ULTRAM) 50 MG tablet Take 1 tablet (50 mg total) by mouth every 6 (six) hours as needed. 04/23/15   Glendell Docker, NP   Triage Vitals: BP 170/88 mmHg  Pulse 89  Temp(Src) 98.1 F (36.7 C) (Oral)  Resp  18  SpO2 100% Physical Exam  Constitutional: He is oriented to person, place, and time. He appears well-developed and well-nourished.  HENT:  Head: Normocephalic and atraumatic.  Eyes: EOM are normal.  Neck: Normal range of motion.  Cardiovascular: Normal rate.   Pulmonary/Chest: Effort normal.  Musculoskeletal: Normal range of motion.  Left lumbar paraspinal tenderness. Good strength and sensation to bilateral lower extremities.  Neurological: He is alert and oriented to person, place, and time.  Skin: Skin is warm and dry.  Psychiatric: He has a normal mood and affect. His behavior is normal.  Nursing note and vitals  reviewed.   ED Course  Procedures (including critical care time) DIAGNOSTIC STUDIES: Oxygen Saturation is 100% on RA, normal by my interpretation.   COORDINATION OF CARE: 11:15 PM- Will prescribe NSAID and muscle relaxer. Advised pt to follow up with previous doctor that treated his back pain. Pt verbalizes understanding and agrees to plan.  Medications - No data to display  Labs Review Labs Reviewed - No data to display  Imaging Review No results found.   EKG Interpretation None      MDM   Final diagnoses:  Left-sided low back pain without sciatica    No red flags. Likely related to chronic problems. Will give flexeril and ultram and have follow up with ortho  I personally performed the services described in this documentation, which was scribed in my presence. The recorded information has been reviewed and is accurate.    Glendell Docker, NP 04/23/15 [place], MD 04/24/15 [id number]

## 2015-04-23 NOTE — Discharge Instructions (Signed)

## 2015-04-23 NOTE — ED Notes (Signed)
Pt c/o lower back pain since yesterday. Pt states he has a hx of back problems. Onset started while he was at work last night.

## 2015-04-27 ENCOUNTER — Encounter (HOSPITAL_COMMUNITY): Payer: Self-pay | Admitting: *Deleted

## 2015-04-27 DIAGNOSIS — R1032 Left lower quadrant pain: Secondary | ICD-10-CM | POA: Insufficient documentation

## 2015-04-27 DIAGNOSIS — E119 Type 2 diabetes mellitus without complications: Secondary | ICD-10-CM | POA: Diagnosis not present

## 2015-04-27 DIAGNOSIS — N289 Disorder of kidney and ureter, unspecified: Secondary | ICD-10-CM | POA: Diagnosis not present

## 2015-04-27 DIAGNOSIS — I10 Essential (primary) hypertension: Secondary | ICD-10-CM | POA: Insufficient documentation

## 2015-04-27 DIAGNOSIS — Z79899 Other long term (current) drug therapy: Secondary | ICD-10-CM | POA: Diagnosis not present

## 2015-04-27 DIAGNOSIS — M545 Low back pain: Secondary | ICD-10-CM | POA: Insufficient documentation

## 2015-04-27 LAB — CBC
HCT: 31.5 % — ABNORMAL LOW (ref 39.0–52.0)
Hemoglobin: 11.5 g/dL — ABNORMAL LOW (ref 13.0–17.0)
MCH: 30.7 pg (ref 26.0–34.0)
MCHC: 36.5 g/dL — ABNORMAL HIGH (ref 30.0–36.0)
MCV: 84 fL (ref 78.0–100.0)
Platelets: 276 10*3/uL (ref 150–400)
RBC: 3.75 MIL/uL — ABNORMAL LOW (ref 4.22–5.81)
RDW: 13 % (ref 11.5–15.5)
WBC: 7.5 10*3/uL (ref 4.0–10.5)

## 2015-04-27 LAB — COMPREHENSIVE METABOLIC PANEL
ALBUMIN: 3.1 g/dL — AB (ref 3.5–5.0)
ALK PHOS: 102 U/L (ref 38–126)
ALT: 24 U/L (ref 17–63)
AST: 24 U/L (ref 15–41)
Anion gap: 8 (ref 5–15)
BUN: 22 mg/dL — ABNORMAL HIGH (ref 6–20)
CALCIUM: 9 mg/dL (ref 8.9–10.3)
CO2: 24 mmol/L (ref 22–32)
CREATININE: 1.52 mg/dL — AB (ref 0.61–1.24)
Chloride: 109 mmol/L (ref 101–111)
GFR calc non Af Amer: 54 mL/min — ABNORMAL LOW (ref >60)
Glucose, Bld: 260 mg/dL — ABNORMAL HIGH (ref 65–99)
POTASSIUM: 3.9 mmol/L (ref 3.5–5.1)
SODIUM: 141 mmol/L (ref 135–145)
Total Bilirubin: 0.5 mg/dL (ref 0.3–1.2)
Total Protein: 6.3 g/dL — ABNORMAL LOW (ref 6.5–8.1)

## 2015-04-27 LAB — LIPASE, BLOOD: Lipase: 23 U/L (ref 22–51)

## 2015-04-27 NOTE — ED Notes (Signed)
Pt refused pain medication offered in triage

## 2015-04-27 NOTE — ED Notes (Signed)
Pt in c/o lower back pain and left abdominal pain, states this started a few days, c/o nausea, denies vomiting, no distress noted

## 2015-04-28 ENCOUNTER — Encounter (HOSPITAL_COMMUNITY): Payer: Self-pay | Admitting: Radiology

## 2015-04-28 ENCOUNTER — Emergency Department (HOSPITAL_COMMUNITY)
Admission: EM | Admit: 2015-04-28 | Discharge: 2015-04-28 | Disposition: A | Discharge status: Home / Self Care | Payer: No Typology Code available for payment source | Attending: Emergency Medicine | Admitting: Emergency Medicine

## 2015-04-28 ENCOUNTER — Emergency Department (HOSPITAL_COMMUNITY): Payer: No Typology Code available for payment source

## 2015-04-28 DIAGNOSIS — M545 Low back pain, unspecified: Secondary | ICD-10-CM

## 2015-04-28 DIAGNOSIS — N289 Disorder of kidney and ureter, unspecified: Secondary | ICD-10-CM

## 2015-04-28 DIAGNOSIS — I10 Essential (primary) hypertension: Secondary | ICD-10-CM

## 2015-04-28 DIAGNOSIS — R109 Unspecified abdominal pain: Secondary | ICD-10-CM

## 2015-04-28 LAB — URINALYSIS, ROUTINE W REFLEX MICROSCOPIC
Bilirubin Urine: NEGATIVE
GLUCOSE, UA: 250 mg/dL — AB
Ketones, ur: NEGATIVE mg/dL
Leukocytes, UA: NEGATIVE
Nitrite: NEGATIVE
PH: 5 (ref 5.0–8.0)
Specific Gravity, Urine: 1.017 (ref 1.005–1.030)
UROBILINOGEN UA: 0.2 mg/dL (ref 0.0–1.0)

## 2015-04-28 LAB — URINE MICROSCOPIC-ADD ON

## 2015-04-28 MED ORDER — KETOROLAC TROMETHAMINE 30 MG/ML IJ SOLN
30.0000 mg | Freq: Once | INTRAMUSCULAR | Status: AC
  Administered 2015-04-28: 30 mg via INTRAVENOUS
  Filled 2015-04-28: qty 1

## 2015-04-28 MED ORDER — CYCLOBENZAPRINE HCL 10 MG PO TABS
10.0000 mg | ORAL_TABLET | Freq: Once | ORAL | Status: AC
  Administered 2015-04-28: 10 mg via ORAL
  Filled 2015-04-28: qty 1

## 2015-04-28 MED ORDER — HYDROMORPHONE HCL 1 MG/ML IJ SOLN
1.0000 mg | Freq: Once | INTRAMUSCULAR | Status: AC
  Administered 2015-04-28: 1 mg via INTRAVENOUS
  Filled 2015-04-28: qty 1

## 2015-04-28 MED ORDER — TRAMADOL HCL 50 MG PO TABS: 50.0000 mg | ORAL_TABLET | Freq: Four times a day (QID) | ORAL | Status: DC | PRN

## 2015-04-28 MED ORDER — CYCLOBENZAPRINE HCL 10 MG PO TABS: 10.0000 mg | ORAL_TABLET | Freq: Three times a day (TID) | ORAL | Status: DC | PRN

## 2015-04-28 MED ORDER — NAPROXEN 500 MG PO TABS: 500.0000 mg | ORAL_TABLET | Freq: Two times a day (BID) | ORAL | Status: DC

## 2015-04-28 MED ORDER — SODIUM CHLORIDE 0.9 % IV SOLN
Freq: Once | INTRAVENOUS | Status: AC
  Administered 2015-04-28: 01:00:00 via INTRAVENOUS

## 2015-04-28 NOTE — ED Notes (Signed)
Patient transported to CT 

## 2015-04-28 NOTE — ED Provider Notes (Signed)
CSN: [id number]   Arrival date & time 04/27/15 1929  History  This chart was scribed for  Delora Fuel, MD by Altamease Oiler, ED Scribe. This patient was seen in room D36C/D36C and the patient's care was started at 12:17 AM.  Chief Complaint  Patient presents with  . Back Pain  . Abdominal Pain    HPI The history is provided by the patient. No language interpreter was used.   TAIGA GUNDY is a 44 y.o. male who presents to the Emergency Department complaining of increasing left lower back pain with gradual onset last week. He describes the pain as "sharp all the time" and rates it 10/10 in severity. The pain radiates across the back and  to the abdomen. No palliating or potentiating factors. He denies any unusual bending, lifting, or twisting. Pt was seen in the ED on 04/23/15 for the same pain and prescribed Flexeril and Ultram. He did not have the prescriptions filled because he misplaced them. No new numbness or tingling (pt states "I always have tingling I'm a diabetic") or bowel or bladder incontinence.   Past Medical History  Diagnosis Date  . Diabetes mellitus     Past Surgical History  Procedure Laterality Date  . Eye surgery      Family History  Problem Relation Age of Onset  . Hypertension Mother   . Diabetes Mother     History  Substance Use Topics  . Smoking status: Never Smoker   . Smokeless tobacco: Not on file  . Alcohol Use: Yes     Comment: occasional     Review of Systems  Gastrointestinal: Positive for abdominal pain.  Musculoskeletal: Positive for back pain.  All other systems reviewed and are negative.    Home Medications   Prior to Admission medications   Medication Sig Start Date End Date Taking? Authorizing Provider  cyclobenzaprine (FLEXERIL) 10 MG tablet Take 1 tablet (10 mg total) by mouth 2 (two) times daily as needed for muscle spasms. 04/23/15   Glendell Docker, NP  furosemide (LASIX) 20 MG tablet Take 1 tablet (20 mg total) by mouth  daily. 02/13/15   Tanna Furry, MD  furosemide (LASIX) 20 MG tablet Take 2 tablets (40 mg total) by mouth daily. 02/13/15   Tanna Furry, MD  hydrochlorothiazide (HYDRODIURIL) 25 MG tablet Take 1 tablet (25 mg total) by mouth daily. 01/31/15   Linton Flemings, MD  metFORMIN (GLUCOPHAGE) 500 MG tablet Take 2 tablets (1,000 mg total) by mouth 2 (two) times daily with a meal. 01/31/15   Linton Flemings, MD  metoprolol (LOPRESSOR) 100 MG tablet Take 1 tablet (100 mg total) by mouth 2 (two) times daily. 02/13/15   Tanna Furry, MD  metoprolol (LOPRESSOR) 25 MG tablet Take 4 tablets (100 mg total) by mouth 2 (two) times daily. 02/13/15   Tanna Furry, MD  potassium chloride 20 MEQ TBCR Take 10 mEq by mouth 2 (two) times daily. 02/13/15   Tanna Furry, MD  traMADol (ULTRAM) 50 MG tablet Take 1 tablet (50 mg total) by mouth every 6 (six) hours as needed. 04/23/15   Glendell Docker, NP    Allergies  Review of patient's allergies indicates no known allergies.  Triage Vitals: BP 174/91 mmHg  Pulse 79  Temp(Src) 98.7 F (37.1 C) (Oral)  Resp 20  SpO2 100%  Physical Exam  Constitutional: He is oriented to person, place, and time. He appears well-developed and well-nourished. No distress.  Appears to be in pain   HENT:  Head: Normocephalic and atraumatic.  Eyes: Conjunctivae and EOM are normal. Pupils are equal, round, and reactive to light.  Neck: Neck supple. No JVD present.  Cardiovascular: Normal rate and regular rhythm.   Pulmonary/Chest: Effort normal and breath sounds normal. He has no wheezes. He has no rales. He exhibits no tenderness.  Abdominal: Soft. Bowel sounds are normal. He exhibits no distension and no mass. There is tenderness. There is no rebound and no guarding.  Mild TTP at the left mid and lower abdomen  Musculoskeletal: Normal range of motion. He exhibits tenderness. He exhibits no edema.  Mild TTP at mid and lower lumbar spine Marked TTP at left paralumbar area with marked spasm SLR+ on left at 15  degrees Crossed SLR+ on right at 30 degrees  Lymphadenopathy:    He has no cervical adenopathy.  Neurological: He is alert and oriented to person, place, and time. No cranial nerve deficit. He exhibits normal muscle tone. Coordination normal.  Skin: Skin is warm and dry. No rash noted. He is not diaphoretic.  Psychiatric: He has a normal mood and affect. His behavior is normal. Judgment and thought content normal.  Nursing note and vitals reviewed.   ED Course  Procedures   DIAGNOSTIC STUDIES: Oxygen Saturation is 100% on RA, normal by my interpretation.    COORDINATION OF CARE: 12:28 AM Discussed treatment plan which includes lab work, CT renal stone, Toradol, Dilaudid, and Flexeril with pt at bedside and pt agreed to plan.  Labs Review-  Results for orders placed or performed during the hospital encounter of 04/28/15  Lipase, blood  Result Value Ref Range   Lipase 23 22 - 51 U/L  Comprehensive metabolic panel  Result Value Ref Range   Sodium 141 135 - 145 mmol/L   Potassium 3.9 3.5 - 5.1 mmol/L   Chloride 109 101 - 111 mmol/L   CO2 24 22 - 32 mmol/L   Glucose, Bld 260 (H) 65 - 99 mg/dL   BUN 22 (H) 6 - 20 mg/dL   Creatinine, Ser 1.52 (H) 0.61 - 1.24 mg/dL   Calcium 9.0 8.9 - 10.3 mg/dL   Total Protein 6.3 (L) 6.5 - 8.1 g/dL   Albumin 3.1 (L) 3.5 - 5.0 g/dL   AST 24 15 - 41 U/L   ALT 24 17 - 63 U/L   Alkaline Phosphatase 102 38 - 126 U/L   Total Bilirubin 0.5 0.3 - 1.2 mg/dL   GFR calc non Af Amer 54 (L) >60 mL/min   GFR calc Af Amer >60 >60 mL/min   Anion gap 8 5 - 15  CBC  Result Value Ref Range   WBC 7.5 4.0 - 10.5 K/uL   RBC 3.75 (L) 4.22 - 5.81 MIL/uL   Hemoglobin 11.5 (L) 13.0 - 17.0 g/dL   HCT 31.5 (L) 39.0 - 52.0 %   MCV 84.0 78.0 - 100.0 fL   MCH 30.7 26.0 - 34.0 pg   MCHC 36.5 (H) 30.0 - 36.0 g/dL   RDW 13.0 11.5 - 15.5 %   Platelets 276 150 - 400 K/uL  Urinalysis, Routine w reflex microscopic (not at [facility])  Result Value Ref Range   Color, Urine  YELLOW YELLOW   APPearance CLEAR CLEAR   Specific Gravity, Urine 1.017 1.005 - 1.030   pH 5.0 5.0 - 8.0   Glucose, UA 250 (A) NEGATIVE mg/dL   Hgb urine dipstick MODERATE (A) NEGATIVE   Bilirubin Urine NEGATIVE NEGATIVE   Ketones, ur NEGATIVE NEGATIVE mg/dL  Protein, ur >300 (A) NEGATIVE mg/dL   Urobilinogen, UA 0.2 0.0 - 1.0 mg/dL   Nitrite NEGATIVE NEGATIVE   Leukocytes, UA NEGATIVE NEGATIVE  Urine microscopic-add on  Result Value Ref Range   WBC, UA 0-2 <3 WBC/hpf   RBC / HPF 3-6 <3 RBC/hpf   Imaging Review Ct Renal Stone Study  04/28/2015   CLINICAL DATA:  Initial evaluation for other acute lower back and abdominal pain.  EXAM: CT ABDOMEN AND PELVIS WITHOUT CONTRAST  TECHNIQUE: Multidetector CT imaging of the abdomen and pelvis was performed following the standard protocol without IV contrast.  COMPARISON:  None available.  FINDINGS: Mild subsegmental atelectasis seen dependently within the visualized lung bases. Visualized lungs are otherwise clear.  Limited noncontrast evaluation of the liver is unremarkable. Gallbladder within normal limits. No biliary dilatation. Spleen, adrenal glands, and pancreas demonstrate a normal unenhanced appearance.  Kidneys are equal in size without evidence of nephrolithiasis or hydronephrosis. No radiopaque calculi seen along the course of either renal collecting system. There is no hydroureter.  Stomach within normal limits. No evidence for bowel obstruction. Appendix well visualized in the right lower quadrant and is of normal caliber and appearance without associated inflammatory changes to suggest acute appendicitis. No acute inflammatory changes seen about the bowels.  Bladder within normal limits.  Prostate normal.  No free air or fluid. No pathologically enlarged intra-abdominal pelvic lymph nodes identified.  No acute osseous abnormality. No worrisome lytic or blastic osseous lesions. Degenerative disc desiccation present at L5-S1.  IMPRESSION: 1. No  CT evidence for nephrolithiasis or obstructive uropathy. 2. No other acute intra-abdominal or pelvic process.   Electronically Signed   By: Jeannine Boga M.D.   On: 04/28/2015 01:59     MDM   Final diagnoses:  Left flank pain  Left-sided low back pain without sciatica  Essential hypertension  Renal insufficiency     Left flank pain which is probably musculoskeletal. However there, there is some radiation to the left lower abdomen, so he will be sent for CT to rule out urolithiasis. He is given ketorolac, cyclobenzaprine, hydromorphone with good relief of pain. CT scan shows no evidence of nephrolithiasis or urolithiasis. Creatinine is noted to be slightly elevated compared with baseline and patient is advised of this including need to follow-up to make sure that it is not worsening. He is discharged with prescriptions for tramadol and cyclobenzaprine. NSAIDs are not given because of new onset renal insufficiency.   I personally performed the services described in this documentation, which was scribed in my presence. The recorded information has been reviewed and is accurate.       Delora Fuel, MD [id number] [phone]

## 2015-04-28 NOTE — Discharge Instructions (Signed)
Monitor your blood pressure at home. Please make sure to take your blood pressure medication. He also will need to have your creatinine level checked. This is a blood test of your kidney and is slightly elevated today. States to be watched closely because high blood pressure is a common cause of kidney failure which can force you to go on dialysis.  Back Pain, Adult Low back pain is very common. About 1 in 5 people have back pain.The cause of low back pain is rarely dangerous. The pain often gets better over time.About half of people with a sudden onset of back pain feel better in just 2 weeks. About 8 in 10 people feel better by 6 weeks.  CAUSES Some common causes of back pain include:  Strain of the muscles or ligaments supporting the spine.  Wear and tear (degeneration) of the spinal discs.  Arthritis.  Direct injury to the back. DIAGNOSIS Most of the time, the direct cause of low back pain is not known.However, back pain can be treated effectively even when the exact cause of the pain is unknown.Answering your caregiver's questions about your overall health and symptoms is one of the most accurate ways to make sure the cause of your pain is not dangerous. If your caregiver needs more information, he or she may order lab work or imaging tests (X-rays or MRIs).However, even if imaging tests show changes in your back, this usually does not require surgery. HOME CARE INSTRUCTIONS For many people, back pain returns.Since low back pain is rarely dangerous, it is often a condition that people can learn to [facility] their own.   Remain active. It is stressful on the back to sit or stand in one place. Do not sit, drive, or stand in one place for more than 30 minutes at a time. Take short walks on level surfaces as soon as pain allows.Try to increase the length of time you walk each day.  Do not stay in bed.Resting more than 1 or 2 days can delay your recovery.  Do not avoid exercise or  work.Your body is made to move.It is not dangerous to be active, even though your back may hurt.Your back will likely heal faster if you return to being active before your pain is gone.  Pay attention to your body when you bend and lift. Many people have less discomfortwhen lifting if they bend their knees, keep the load close to their bodies,and avoid twisting. Often, the most comfortable positions are those that put less stress on your recovering back.  Find a comfortable position to sleep. Use a firm mattress and lie on your side with your knees slightly bent. If you lie on your back, put a pillow under your knees.  Only take over-the-counter or prescription medicines as directed by your caregiver. Over-the-counter medicines to reduce pain and inflammation are often the most helpful.Your caregiver may prescribe muscle relaxant drugs.These medicines help dull your pain so you can more quickly return to your normal activities and healthy exercise.  Put ice on the injured area.  Put ice in a plastic bag.  Place a towel between your skin and the bag.  Leave the ice on for 15-20 minutes, 03-04 times a day for the first 2 to 3 days. After that, ice and heat may be alternated to reduce pain and spasms.  Ask your caregiver about trying back exercises and gentle massage. This may be of some benefit.  Avoid feeling anxious or stressed.Stress increases muscle tension and can  worsen back pain.It is important to recognize when you are anxious or stressed and learn ways to manage it.Exercise is a great option. SEEK MEDICAL CARE IF:  You have pain that is not relieved with rest or medicine.  You have pain that does not improve in 1 week.  You have new symptoms.  You are generally not feeling well. SEEK IMMEDIATE MEDICAL CARE IF:   You have pain that radiates from your back into your legs.  You develop new bowel or bladder control problems.  You have unusual weakness or numbness in  your arms or legs.  You develop nausea or vomiting.  You develop abdominal pain.  You feel faint. Document Released: 09/19/2005 Document Revised: 03/20/2012 Document Reviewed: 01/21/2014 [facility] Patient Information 2015 [place], [place]. This information is not intended to replace advice given to you by your health care provider. Make sure you discuss any questions you have with your health care provider.  Tramadol tablets What is this medicine? TRAMADOL (TRA ma dole) is a pain reliever. It is used to treat moderate to severe pain in adults. This medicine may be used for other purposes; ask your health care provider or pharmacist if you have questions. COMMON BRAND NAME(S): Ultram What should I tell my health care provider before I take this medicine? They need to know if you have any of these conditions: -brain tumor -depression -drug abuse or addiction -head injury -if you frequently drink alcohol containing drinks -kidney disease or trouble passing urine -liver disease -lung disease, asthma, or breathing problems -seizures or epilepsy -suicidal thoughts, plans, or attempt; a previous suicide attempt by you or a family member -an unusual or allergic reaction to tramadol, codeine, other medicines, foods, dyes, or preservatives -pregnant or trying to get pregnant -breast-feeding How should I use this medicine? Take this medicine by mouth with a full glass of water. Follow the directions on the prescription label. If the medicine upsets your stomach, take it with food or milk. Do not take more medicine than you are told to take. Talk to your pediatrician regarding the use of this medicine in children. Special care may be needed. Overdosage: If you think you have taken too much of this medicine contact a poison control center or emergency room at once. NOTE: This medicine is only for you. Do not share this medicine with others. What if I miss a dose? If you miss a dose, take it as  soon as you can. If it is almost time for your next dose, take only that dose. Do not take double or extra doses. What may interact with this medicine? Do not take this medicine with any of the following medications: -MAOIs like Carbex, Eldepryl, Marplan, Nardil, and Parnate This medicine may also interact with the following medications: -alcohol or medicines that contain alcohol -antihistamines -benzodiazepines -bupropion -carbamazepine or oxcarbazepine -clozapine -cyclobenzaprine -digoxin -furazolidone -linezolid -medicines for depression, anxiety, or psychotic disturbances -medicines for migraine headache like almotriptan, eletriptan, frovatriptan, naratriptan, rizatriptan, sumatriptan, zolmitriptan -medicines for pain like pentazocine, buprenorphine, butorphanol, meperidine, nalbuphine, and propoxyphene -medicines for sleep -muscle relaxants -naltrexone -phenobarbital -phenothiazines like perphenazine, thioridazine, chlorpromazine, mesoridazine, fluphenazine, prochlorperazine, promazine, and trifluoperazine -procarbazine -warfarin This list may not describe all possible interactions. Give your health care provider a list of all the medicines, herbs, non-prescription drugs, or dietary supplements you use. Also tell them if you smoke, drink alcohol, or use illegal drugs. Some items may interact with your medicine. What should I watch for while using this medicine? Tell your  doctor or health care professional if your pain does not go away, if it gets worse, or if you have new or a different type of pain. You may develop tolerance to the medicine. Tolerance means that you will need a higher dose of the medicine for pain relief. Tolerance is normal and is expected if you take this medicine for a long time. Do not suddenly stop taking your medicine because you may develop a severe reaction. Your body becomes used to the medicine. This does NOT mean you are addicted. Addiction is a behavior  related to getting and using a drug for a non-medical reason. If you have pain, you have a medical reason to take pain medicine. Your doctor will tell you how much medicine to take. If your doctor wants you to stop the medicine, the dose will be slowly lowered over time to avoid any side effects. You may get drowsy or dizzy. Do not drive, use machinery, or do anything that needs mental alertness until you know how this medicine affects you. Do not stand or sit up quickly, especially if you are an older patient. This reduces the risk of dizzy or fainting spells. Alcohol can increase or decrease the effects of this medicine. Avoid alcoholic drinks. You may have constipation. Try to have a bowel movement at least every 2 to 3 days. If you do not have a bowel movement for 3 days, call your doctor or health care professional. Your mouth may get dry. Chewing sugarless gum or sucking hard candy, and drinking plenty of water may help. Contact your doctor if the problem does not go away or is severe. What side effects may I notice from receiving this medicine? Side effects that you should report to your doctor or health care professional as soon as possible: -allergic reactions like skin rash, itching or hives, swelling of the face, lips, or tongue -breathing difficulties, wheezing -confusion -itching -light headedness or fainting spells -redness, blistering, peeling or loosening of the skin, including inside the mouth -seizures Side effects that usually do not require medical attention (report to your doctor or health care professional if they continue or are bothersome): -constipation -dizziness -drowsiness -headache -nausea, vomiting This list may not describe all possible side effects. Call your doctor for medical advice about side effects. You may report side effects to FDA at 1-800-FDA-1088. Where should I keep my medicine? Keep out of the reach of children. Store at room temperature between 15  and 30 degrees C (59 and 86 degrees F). Keep container tightly closed. Throw away any unused medicine after the expiration date. NOTE: This sheet is a summary. It may not cover all possible information. If you have questions about this medicine, talk to your doctor, pharmacist, or health care provider.  2015, Elsevier/Gold Standard. (2010-06-02 11:55:44)  Cyclobenzaprine tablets What is this medicine? CYCLOBENZAPRINE (sye kloe BEN za preen) is a muscle relaxer. It is used to treat muscle pain, spasms, and stiffness. This medicine may be used for other purposes; ask your health care provider or pharmacist if you have questions. COMMON BRAND NAME(S): Fexmid, Flexeril What should I tell my health care provider before I take this medicine? They need to know if you have any of these conditions: -heart disease, irregular heartbeat, or previous heart attack -liver disease -thyroid problem -an unusual or allergic reaction to cyclobenzaprine, tricyclic antidepressants, lactose, other medicines, foods, dyes, or preservatives -pregnant or trying to get pregnant -breast-feeding How should I use this medicine? Take this medicine by  mouth with a glass of water. Follow the directions on the prescription label. If this medicine upsets your stomach, take it with food or milk. Take your medicine at regular intervals. Do not take it more often than directed. Talk to your pediatrician regarding the use of this medicine in children. Special care may be needed. Overdosage: If you think you have taken too much of this medicine contact a poison control center or emergency room at once. NOTE: This medicine is only for you. Do not share this medicine with others. What if I miss a dose? If you miss a dose, take it as soon as you can. If it is almost time for your next dose, take only that dose. Do not take double or extra doses. What may interact with this medicine? Do not take this medicine with any of the following  medications: -certain medicines for fungal infections like fluconazole, itraconazole, ketoconazole, posaconazole, voriconazole -cisapride -dofetilide -dronedarone -droperidol -flecainide -grepafloxacin -halofantrine -levomethadyl -MAOIs like Carbex, Eldepryl, Marplan, Nardil, and Parnate -nilotinib -pimozide -probucol -sertindole -thioridazine -ziprasidone This medicine may also interact with the following medications: -abarelix -alcohol -certain medicines for cancer -certain medicines for depression, anxiety, or psychotic disturbances -certain medicines for infection like alfuzosin, chloroquine, clarithromycin, levofloxacin, mefloquine, pentamidine, troleandomycin -certain medicines for an irregular heart beat -certain medicines used for sleep or numbness during surgery or procedure -contrast dyes -dolasetron -guanethidine -methadone -octreotide -ondansetron -other medicines that prolong the QT interval (cause an abnormal heart rhythm) -palonosetron -phenothiazines like chlorpromazine, mesoridazine, prochlorperazine, thioridazine -tramadol -vardenafil This list may not describe all possible interactions. Give your health care provider a list of all the medicines, herbs, non-prescription drugs, or dietary supplements you use. Also tell them if you smoke, drink alcohol, or use illegal drugs. Some items may interact with your medicine. What should I watch for while using this medicine? Check with your doctor or health care professional if your condition does not improve within 1 to 3 weeks. You may get drowsy or dizzy when you first start taking the medicine or change doses. Do not drive, use machinery, or do anything that may be dangerous until you know how the medicine affects you. Stand or sit up slowly. Your mouth may get dry. Drinking water, chewing sugarless gum, or sucking on hard candy may help. What side effects may I notice from receiving this medicine? Side effects  that you should report to your doctor or health care professional as soon as possible: -allergic reactions like skin rash, itching or hives, swelling of the face, lips, or tongue -chest pain -fast heartbeat -hallucinations -seizures -vomiting Side effects that usually do not require medical attention (report to your doctor or health care professional if they continue or are bothersome): -headache This list may not describe all possible side effects. Call your doctor for medical advice about side effects. You may report side effects to FDA at 1-800-FDA-1088. Where should I keep my medicine? Keep out of the reach of children. Store at room temperature between 15 and 30 degrees C (59 and 86 degrees F). Keep container tightly closed. Throw away any unused medicine after the expiration date. NOTE: This sheet is a summary. It may not cover all possible information. If you have questions about this medicine, talk to your doctor, pharmacist, or health care provider.  2015, Elsevier/Gold Standard. (2013-04-16 12:48:19)  Hypertension Hypertension, commonly called high blood pressure, is when the force of blood pumping through your arteries is too strong. Your arteries are the blood vessels that carry  blood from your heart throughout your body. A blood pressure reading consists of a higher number over a lower number, such as 110/72. The higher number (systolic) is the pressure inside your arteries when your heart pumps. The lower number (diastolic) is the pressure inside your arteries when your heart relaxes. Ideally you want your blood pressure below 120/80. Hypertension forces your heart to work harder to pump blood. Your arteries may become narrow or stiff. Having hypertension puts you at risk for heart disease, stroke, and other problems.  RISK FACTORS Some risk factors for high blood pressure are controllable. Others are not.  Risk factors you cannot control include:   Race. You may be at higher  risk if you are African American.  Age. Risk increases with age.  Gender. Men are at higher risk than women before age 73 years. After age 28, women are at higher risk than men. Risk factors you can control include:  Not getting enough exercise or physical activity.  Being overweight.  Getting too much fat, sugar, calories, or salt in your diet.  Drinking too much alcohol. SIGNS AND SYMPTOMS Hypertension does not usually cause signs or symptoms. Extremely high blood pressure (hypertensive crisis) may cause headache, anxiety, shortness of breath, and nosebleed. DIAGNOSIS  To check if you have hypertension, your health care provider will measure your blood pressure while you are seated, with your arm held at the level of your heart. It should be measured at least twice using the same arm. Certain conditions can cause a difference in blood pressure between your right and left arms. A blood pressure reading that is higher than normal on one occasion does not mean that you need treatment. If one blood pressure reading is high, ask your health care provider about having it checked again. TREATMENT  Treating high blood pressure includes making lifestyle changes and possibly taking medicine. Living a healthy lifestyle can help lower high blood pressure. You may need to change some of your habits. Lifestyle changes may include:  Following the DASH diet. This diet is high in fruits, vegetables, and whole grains. It is low in salt, red meat, and added sugars.  Getting at least 2 hours of brisk physical activity every week.  Losing weight if necessary.  Not smoking.  Limiting alcoholic beverages.  Learning ways to reduce stress. If lifestyle changes are not enough to get your blood pressure under control, your health care provider may prescribe medicine. You may need to take more than one. Work closely with your health care provider to understand the risks and benefits. HOME CARE  INSTRUCTIONS  Have your blood pressure rechecked as directed by your health care provider.   Take medicines only as directed by your health care provider. Follow the directions carefully. Blood pressure medicines must be taken as prescribed. The medicine does not work as well when you skip doses. Skipping doses also puts you at risk for problems.   Do not smoke.   Monitor your blood pressure at home as directed by your health care provider. SEEK MEDICAL CARE IF:   You think you are having a reaction to medicines taken.  You have recurrent headaches or feel dizzy.  You have swelling in your ankles.  You have trouble with your vision. SEEK IMMEDIATE MEDICAL CARE IF:  You develop a severe headache or confusion.  You have unusual weakness, numbness, or feel faint.  You have severe chest or abdominal pain.  You vomit repeatedly.  You have trouble breathing. MAKE  SURE YOU:   Understand these instructions.  Will watch your condition.  Will get help right away if you are not doing well or get worse. Document Released: 09/19/2005 Document Revised: 02/03/2014 Document Reviewed: 07/12/2013 [facility] Patient Information 2015 [place], [place]. This information is not intended to replace advice given to you by your health care provider. Make sure you discuss any questions you have with your health care provider.   Emergency Department Resource Guide 1) Find a Doctor and Pay Out of Pocket Although you won't have to find out who is covered by your insurance plan, it is a good idea to ask around and get recommendations. You will then need to call the office and see if the doctor you have chosen will accept you as a new patient and what types of options they offer for patients who are self-pay. Some doctors offer discounts or will set up payment plans for their patients who do not have insurance, but you will need to ask so you aren't surprised when you get to your appointment.  2)  Contact Your Local Health Department Not all health departments have doctors that can see patients for sick visits, but many do, so it is worth a call to see if yours does. If you don't know where your local health department is, you can check in your phone book. The CDC also has a tool to help you locate your state's health department, and many state websites also have listings of all of their local health departments.  3) Find a [facility] If your illness is not likely to be very severe or complicated, you may want to try a walk in clinic. These are popping up all over the country in pharmacies, drugstores, and shopping centers. They're usually staffed by nurse practitioners or physician assistants that have been trained to treat common illnesses and complaints. They're usually fairly quick and inexpensive. However, if you have serious medical issues or chronic medical problems, these are probably not your best option.  No Primary Care Doctor: - Call [facility] Connect at  [phone] - they can help you locate a primary care doctor that  accepts your insurance, provides certain services, etc. - Physician Referral Service- [phone]  Chronic Pain Problems: Organization         Address  Phone   Notes  [facility]  [phone] Patients need to be referred by their primary care doctor.   Medication Assistance: Organization         Address  Phone   Notes  [facility] Medication [facility] [place], [place] [address] [phone] --Must be a resident of [facility] -- Must have NO insurance coverage whatsoever (no Medicaid/ Medicare, etc.) -- The pt. MUST have a primary care doctor that directs their care regularly and follows them in the community   [facility]  [phone]   [employer]  [phone]    Agencies that provide inexpensive medical care: Organization         Address  Phone   Notes  [place]   [phone]   [facility]    [phone]   [facility] [place], [place] [address] [phone]   [place] [address] [place] [phone]   Planned Parenthood    [phone]   [facility]    [phone]   [facility]  and [address], [place] Phone:  [phone], Fax:  [phone] Hours of Operation:  9 am - 6 pm, M-F.  Also accepts Medicaid/Medicare and self-pay.  [facility] for [place], [address], [place] Phone: [phone], Fax: [phone]. Hours of Operation:  8:30 am - 5:30 pm, M-F.  Also accepts Medicaid and self-pay.  [facility] [address] [place] Phone: [phone]   [place], [place], [place] [phone], [address]. [phone] Mondays & Thursdays: 7-9 AM.  First 15 patients are seen on a first come, first serve basis.    [place] Providers:  Organization         Address  Phone   Notes  [facility] [address], [place] [phone] Also accepts self-pay patients.  [facility] [id number] [place], [place]  [phone]   [place], [address], [place] [phone]   [facility] [address], [place] [phone]   Lucianne Lei [address], [place]   [phone] Only accepts [place] [facility] [place] patients after they have their name applied to their card.   Self-Pay (no insurance) in [facility]:  Organization         Address  Phone   Notes  Sickle Cell Patients, [facility] [place] [phone]   [facility] [place] [phone]   [facility] [place]  [place], [place], [place] [phone]   [facility]/Dr. Osei-Bonsu  [address], [place] or [address], [place] [phone] Phone number for both [place] and [place] locations is the same.  [facility]gent [facility] and [facility] [address] [place] [phone]   [facility] [address] [place] or [address] [phone]   [facility] [address] [place] [phone], phone; [phone], fax Sees patients 1st and 3rd Saturday of every month.  Must not qualify for public or private insurance (i.e. Medicaid, Medicare, [place] Health Choice, Veterans' Benefits)  Household income should be no more than 200% of the poverty level The clinic cannot treat you if you are pregnant or think you are pregnant  Sexually transmitted diseases are not treated at the clinic.    Dental Care: Organization         Address  Phone  Notes  [facility] Department of [place] [facility] [phone] Accepts children up to age 55 who are enrolled in [place] or [place]; pregnant women with a Medicaid card; and children who have applied for Medicaid or [place] Health Choice, but were declined, whose parents can pay a reduced fee at time of service.  [facility] Department of [facility]  [address], [place] [phone] Accepts children up to age 37 who are enrolled in [place] or [place]; pregnant women with a Medicaid card; and children who have applied for Medicaid or [place] Health Choice, but were declined, whose parents can pay a reduced fee at time of service.  [facility]  [place],  [place] [phone] Patients are seen by appointment only. Walk-ins are not accepted. [place] will see patients 29 years of age and older. Monday - Tuesday  (8am-5pm) Most Wednesdays (8:30-5pm) $30 per visit, cash only  [facility] Adult Dental Access PROGRAM  [address], [facility] [phone] Patients are seen by appointment only. Walk-ins are not accepted. [place] will see patients 26 years of age and older. One Wednesday Evening (Monthly: Volunteer Based).  $30 per visit, cash only  [place]  [phone] for adults; Children under age 85, call Graduate Pediatric Dentistry at [phone]. Children aged 34-14, please call [phone] to request a pediatric application.  Dental services are provided in all areas of dental care including fillings, crowns and bridges, complete and partial dentures, implants, gum treatment, root canals, and extractions. Preventive care is also provided. Treatment is provided to both adults and children. Patients are selected via a lottery and there is often a waiting list.   [facility] [address] [place]  [phone] [email or web address]   [facility] [address] [place], [place] [phone], Ext. [phone] Second and Fourth Thursday of each month, opens at 6:30 AM; Clinic ends at 9 AM.  Patients are seen on a first-come first-served basis, and a limited number are seen during each clinic.   [facility]  [address] Hillard Danker [place], [place] [phone]   Eligibility Requirements You must have lived in [place], [place], or [place] counties for at least the last three months.   You cannot be eligible for state or federal sponsored [employer], including [employer], [place], or [employer].   You generally cannot be eligible for healthcare insurance through your employer.    How to apply: Eligibility screenings are held every Tuesday and Wednesday afternoon from 1:00 pm until 4:00 pm. You do not need an appointment for the interview!  [facility] [address], [place], [place]   [place]  [place] Department  [place]  [phone]    Behavioral Health Resources in the Community: Intensive Outpatient Programs Organization         Address  Phone  Notes  [place]. [address], [place], [place] [phone]   [facility] [address], [place], [place]   ADS: [facility] [address] [place], [place]   [place] [address]  [place], [place] or [phone]   Substance Abuse Resources Organization         Address  Phone  Notes  Alcohol and Drug Services  [phone]   [place]  [phone]   [facility]   Chinita Pester  [phone]   Residential & Outpatient Substance Abuse Program  [phone]   Psychological Services Organization         Address  Phone  Notes  [facility] [place]  [place]  [phone]   [place] [address] [place] or [phone]    Mobile Crisis Teams Organization         Address  Phone  Notes  [facility], Mobile Crisis Care Unit  [phone]   [facility]  [address] [place], [place]   [place] [phone]  [address] [phone]    Self-Help/Support Groups Organization         Address  Phone             Notes  [facility] [place] - variety of support groups  [place] Call for more information  Narcotics Anonymous (NA), [facility] [address], [employer] [place]  2 meetings at this location   Special educational needs teacher         Address  Phone  Notes  [facility] [place],    [place]  [phone]   [facility]  [address], [place] [id number], [place], [place]   [place], [place] [phone] Admissions: 8am-3pm M-F  Incentives Substance [place] [address],    [place], [place] [phone]   [facility] [address] [place], [place], [place]   [facility] [address],  [place], [place]   [facility] - Intensive Outpatient [address], [place], [place]   [facility] ([place].,  [place], [place] [phone] or [phone]   [facility] ([facility]) [address] [place], [place] Accepts Medicaid  [facility] [place] [address],  [place] [phone] Substance Abuse/Addiction Treatment   [facility] Organization         Address  Phone  Notes  [facility]  [phone]   Domenic Schwab, PhD [address] Arlis Porta [place], [place]   [phone] or [phone]   [place], [place] [phone]   [facility] [address], [place], [place] [phone] Insurance/Medicaid/sponsorship through [facility] and Families [address]                                    [place], [place] [phone] [place] [address], [place] [phone]    Dr. Adele Schilder  [phone]   [facility] Dept. 1) [address], [place] 2) [place] 3)  [place] [address], [place] [phone]  [phone]   [place] [phone] or [phone] (After Hours)

## 2015-05-05 ENCOUNTER — Encounter: Payer: Self-pay | Admitting: Internal Medicine

## 2015-05-05 ENCOUNTER — Ambulatory Visit: Payer: No Typology Code available for payment source | Attending: Internal Medicine | Admitting: Internal Medicine

## 2015-05-05 ENCOUNTER — Telehealth: Payer: Self-pay

## 2015-05-05 VITALS — BP 160/89 | HR 89 | Temp 98.4°F | Resp 18 | Ht 72.0 in | Wt 220.0 lb

## 2015-05-05 DIAGNOSIS — IMO0002 Reserved for concepts with insufficient information to code with codable children: Secondary | ICD-10-CM

## 2015-05-05 DIAGNOSIS — M5442 Lumbago with sciatica, left side: Secondary | ICD-10-CM

## 2015-05-05 DIAGNOSIS — E118 Type 2 diabetes mellitus with unspecified complications: Secondary | ICD-10-CM | POA: Diagnosis not present

## 2015-05-05 DIAGNOSIS — I10 Essential (primary) hypertension: Secondary | ICD-10-CM | POA: Diagnosis not present

## 2015-05-05 DIAGNOSIS — R6882 Decreased libido: Secondary | ICD-10-CM

## 2015-05-05 DIAGNOSIS — R351 Nocturia: Secondary | ICD-10-CM

## 2015-05-05 DIAGNOSIS — R748 Abnormal levels of other serum enzymes: Secondary | ICD-10-CM

## 2015-05-05 DIAGNOSIS — R609 Edema, unspecified: Secondary | ICD-10-CM

## 2015-05-05 DIAGNOSIS — E1165 Type 2 diabetes mellitus with hyperglycemia: Secondary | ICD-10-CM

## 2015-05-05 DIAGNOSIS — R3912 Poor urinary stream: Secondary | ICD-10-CM

## 2015-05-05 DIAGNOSIS — R7989 Other specified abnormal findings of blood chemistry: Secondary | ICD-10-CM

## 2015-05-05 DIAGNOSIS — E1139 Type 2 diabetes mellitus with other diabetic ophthalmic complication: Secondary | ICD-10-CM | POA: Insufficient documentation

## 2015-05-05 LAB — GLUCOSE, POCT (MANUAL RESULT ENTRY): POC Glucose: 261 mg/dl — AB (ref 70–99)

## 2015-05-05 LAB — POCT GLYCOSYLATED HEMOGLOBIN (HGB A1C): HEMOGLOBIN A1C: 8.6

## 2015-05-05 MED ORDER — GLIMEPIRIDE 2 MG PO TABS: 2.0000 mg | ORAL_TABLET | Freq: Every day | ORAL | Status: DC

## 2015-05-05 MED ORDER — INSULIN ASPART 100 UNIT/ML ~~LOC~~ SOLN
10.0000 [IU] | Freq: Once | SUBCUTANEOUS | Status: AC
  Administered 2015-05-05: 10 [IU] via SUBCUTANEOUS

## 2015-05-05 MED ORDER — FUROSEMIDE 20 MG PO TABS
20.0000 mg | ORAL_TABLET | Freq: Every day | ORAL | Status: DC
Start: 2015-05-05 — End: 2015-11-26

## 2015-05-05 MED ORDER — POTASSIUM CHLORIDE ER 10 MEQ PO TBCR: 10.0000 meq | EXTENDED_RELEASE_TABLET | Freq: Every day | ORAL | Status: DC

## 2015-05-05 NOTE — Progress Notes (Signed)
Patient ID: Edward [name], male   DOB: Oct 11, 1970, 44 y.o.   MRN: [id number]  CC: f/u  HPI: Edward [name] is a 44 y.o. male here today for a follow up visit.  Patient has past medical history of hypertension and diabetes mellitus, type 2. Patient reports that he has been off all medications for several months, although he knows he should take medication. He states that he has been on Metformin and metoprolol in the past.     Today he is concerned about lower back pain that has caused him [name] go [name] the ER twice this week due [name] pain. He notes that he has had this pain for several years but it is now becoming worse. Pain is sharp and shooting. It occasionally radiates down his left leg. He denies bowel or bladder dysfunction.   Reports that he was told that his thyroid function was off in past, wants that checked today. No signs of anxiety, weight changes, tremors, or energy changes. He notes that he has been having pitting edema for several years and has always been given Lasix [name] help pull fluid off.   Patient has No headache, No chest pain, No abdominal pain - No Nausea, No new weakness tingling or numbness, No Cough - SOB.  No Known Allergies Past Medical History  Diagnosis Date  . Diabetes mellitus   . Hypertension    Current Outpatient Prescriptions on File Prior [name] Visit  Medication Sig Dispense Refill  . cyclobenzaprine (FLEXERIL) 10 MG tablet Take 1 tablet (10 mg total) by mouth 3 (three) times daily as needed for muscle spasms. 30 tablet 0  . metFORMIN (GLUCOPHAGE) 500 MG tablet Take 2 tablets (1,000 mg total) by mouth 2 (two) times daily with a meal. (Patient not taking: Reported on 05/05/2015) 60 tablet 0  . potassium chloride 20 MEQ TBCR Take 10 mEq by mouth 2 (two) times daily. (Patient not taking: Reported on 05/05/2015) 30 tablet 0  . traMADol (ULTRAM) 50 MG tablet Take 1 tablet (50 mg total) by mouth every 6 (six) hours as needed. (Patient not taking: Reported on 05/05/2015)  15 tablet 0  . [DISCONTINUED] gabapentin (NEURONTIN) 100 MG capsule Take 1 capsule (100 mg total) by mouth 3 (three) times daily. 60 capsule 0   No current facility-administered medications on file prior [name] visit.   Family History  Problem Relation Age of Onset  . Hypertension Mother   . Diabetes Mother    History   Social History  . Marital Status: Married    Spouse Name: N/A  . Number of Children: N/A  . Years of Education: N/A   Occupational History  . Not on file.   Social History Main Topics  . Smoking status: Never Smoker   . Smokeless tobacco: Not on file  . Alcohol Use: Yes     Comment: 1 beer every night  . Drug Use: No  . Sexual Activity: Not on file   Other Topics Concern  . Not on file   Social History Narrative     Review of Systems  Eyes: Positive for blurred vision.  Genitourinary: Positive for frequency.       Nocturia (3-4 times) Dribbling after urination Weak urine stream Decreased libido Erectile dysfunction  Musculoskeletal: Positive for back pain.       Hand cramps  Neurological: Positive for dizziness, tingling (BLE/BUE) and headaches.  Endo/Heme/Allergies: Positive for polydipsia.      Objective:   Filed Vitals:   05/05/15 1542  BP: 160/89  Pulse: 89  Temp: 98.4 F (36.9 C)  Resp: 18    Physical Exam  Constitutional: He is oriented [name] person, place, and time.  Neck: No thyromegaly present.  Cardiovascular: Normal rate, regular rhythm and normal heart sounds.   Pulmonary/Chest: Effort normal and breath sounds normal.  Genitourinary:  Refused prostate exam  Lymphadenopathy:    He has no cervical adenopathy.  Neurological: He is alert and oriented [name] person, place, and time.  Skin: Skin is warm and dry.     Lab Results  Component Value Date   WBC 7.5 04/27/2015   HGB 11.5* 04/27/2015   HCT 31.5* 04/27/2015   MCV 84.0 04/27/2015   PLT 276 04/27/2015   Lab Results  Component Value Date   CREATININE 1.52*  04/27/2015   BUN 22* 04/27/2015   NA 141 04/27/2015   K 3.9 04/27/2015   CL 109 04/27/2015   CO2 24 04/27/2015    Lab Results  Component Value Date   HGBA1C 8.60 05/05/2015   Lipid Panel     Component Value Date/Time   CHOL 143 06/21/2013 1002   TRIG 157* 06/21/2013 1002   HDL 52 06/21/2013 1002   CHOLHDL 2.8 06/21/2013 1002   VLDL 31 06/21/2013 1002   LDLCALC 60 06/21/2013 1002       Assessment and plan:   Adyant was seen today for establish care.  Diagnoses and all orders for this visit:  Diabetes mellitus type 2, uncontrolled, with complications Orders: -     Glucose (CBG) -     HgB A1c -     Microalbumin, urine -     insulin aspart (novoLOG) injection 10 Units; Inject 0.1 mLs (10 Units total) into the skin once in office -     Begin glimepiride (AMARYL) 2 MG tablet; Take 1 tablet (2 mg total) by mouth daily with breakfast. I will place patient on Amaryl daily [name] get A1C below 8%. I have went over long term complications of diabetes when uncontrolled or non-compliant with medication.   Essential hypertension I will place patient on Lasix [name] pull off fluid which may help with BP control as well. Will monitor in 2 weeks. If not controlled on lasix I will add a second agent at that time.   Pitting edema Comments: Bilateral  Orders: -     Echocardiogram; Future -     Begin furosemide (LASIX) 20 MG tablet; Take 1 tablet (20 mg total) by mouth daily. -     Begin potassium chloride (K-DUR) 10 MEQ tablet; Take 1 tablet (10 mEq total) by mouth daily. Must take with furosemid Due [name] long term pitting edema I will order a Echo [name] r/o out heart failure. Added Lasix [name] regimen.   Elevated serum creatinine Worsening creatinine since May. I will continue [name] monitor, will draw labs in 1 week. I have stressed that his uncontrolled DM and HTN are likely causing kidney damage.   Midline low back pain with left-sided sciatica Orders: -     Ambulatory referral [name] Orthopedic  Surgery  Weak urine stream Orders: -     Ambulatory referral [name] Urology Many of patients symptoms appear [name] be related [name] his prostate.He is fairly young [name] have prostate issues. I will refer out and order a PSA level. He refuses digital rectal exam today.   Nocturia See above   Decreased libido without sexual dysfunction Likely related [name] his prostate. I have also explained that it may be related  [name] uncontrolled DM and HTN. I will check testosterone level as well.    Return in about 1 week (around 05/12/2015) for Lab Visit and RN visit-BP check .       Lance Bosch, [place] [facility] [phone] 05/05/2015, 4:18 PM

## 2015-05-05 NOTE — Progress Notes (Signed)
Patient here to establish care for HTN and DM. Patient went to ED twice in two weeks. Patient has problems with kidneys and back. Patient took hydrocortisone shots in back in past.   Patient needs glucometer. Reports not taking medications as directed.   Patient reports pain in lower back, at level 5, described as discomfort.   Patient reports pain in left side, at level 5, described as soreness.   Patient needs help getting up and sitting down at times. Patient has deteriorating discs.   Patient having headaches every day or every other day. Patient reports getting dizzy with headaches. Current headache at level 7. Patient reports blurry vision with headaches.   Patient ate at 1pm , baked beans and jasmine rice, baked curry chicken, with soda pop.

## 2015-05-05 NOTE — Patient Instructions (Addendum)
I have sent you for a study of your heart. This is going to evaluate for heart failure since you have been having so much swelling in your legs  I have started you on Glimepiride 2 mg once daily for diabetes Furosemide to help pull fluid off your legs and help lower blood pressure. Because furosemide makes you pee, it also makes you lose potassium. So I have sent potassium pills for you to take once daily as well.  I will bring you back in 1 week for a lab visit and to have the nurse to check your blood pressure.  Take your furosemide and potassium that morning before coming with water only, no food.

## 2015-05-05 NOTE — Telephone Encounter (Signed)
Nurse called patient, patient verified date of birth. Nurse called patient to make patient aware of going to echo appt 15 minutes on May 12, 2015. Appointment is at 11am, patient agrees to be there at 10:45am. Pateitn aware of going into [employer], Registration.  Patient voices understanding and has no further questions at this time.

## 2015-05-06 LAB — MICROALBUMIN, URINE: Microalb, Ur: 217.6 mg/dL — ABNORMAL HIGH (ref <2.0)

## 2015-05-12 ENCOUNTER — Ambulatory Visit (HOSPITAL_COMMUNITY)
Admission: RE | Admit: 2015-05-12 | Discharge: 2015-05-12 | Disposition: A | Discharge status: Home / Self Care | Payer: No Typology Code available for payment source | Source: Ambulatory Visit | Attending: Internal Medicine | Admitting: Internal Medicine

## 2015-05-12 ENCOUNTER — Other Ambulatory Visit: Payer: Self-pay | Admitting: Internal Medicine

## 2015-05-12 DIAGNOSIS — I340 Nonrheumatic mitral (valve) insufficiency: Secondary | ICD-10-CM | POA: Insufficient documentation

## 2015-05-12 DIAGNOSIS — I10 Essential (primary) hypertension: Secondary | ICD-10-CM | POA: Insufficient documentation

## 2015-05-12 DIAGNOSIS — R609 Edema, unspecified: Secondary | ICD-10-CM

## 2015-05-12 DIAGNOSIS — E119 Type 2 diabetes mellitus without complications: Secondary | ICD-10-CM | POA: Diagnosis not present

## 2015-05-12 DIAGNOSIS — R931 Abnormal findings on diagnostic imaging of heart and coronary circulation: Secondary | ICD-10-CM

## 2015-05-12 NOTE — Progress Notes (Signed)
*  PRELIMINARY RESULTS* Echocardiogram 2D Echocardiogram has been performed.  Leavy Cella 05/12/2015, 10:56 AM

## 2015-05-15 ENCOUNTER — Other Ambulatory Visit: Payer: Self-pay | Admitting: Internal Medicine

## 2015-05-15 DIAGNOSIS — R770 Abnormality of albumin: Secondary | ICD-10-CM

## 2015-05-15 DIAGNOSIS — R609 Edema, unspecified: Secondary | ICD-10-CM

## 2015-05-19 ENCOUNTER — Telehealth: Payer: Self-pay

## 2015-05-19 NOTE — Telephone Encounter (Signed)
Nurse called patient, patient verified date of birth. Patient aware of echo looks ok and no findings of congestive heart failure. Patient agrees to return for additional blood work due to provider feeling the swelling may be a result of a problem with his kidneys, possibly as a result of diabetes. Patient transferred to front office staff to schedule lab appointment.  Patient voices understanding and has no further questions at this time.

## 2015-05-19 NOTE — Telephone Encounter (Signed)
-----   Message from Lance Bosch, NP sent at 05/15/2015  3:06 PM EDT ----- Echo looks ok. No findings of congestive heart failure. I need him to return for some additional blood work because I feel the swelling may be a result of a problem with his kidneys possibly as a result of his diabetes. I have placed future orders for labs. Please have him to schedule lab appt soon.

## 2015-05-29 ENCOUNTER — Ambulatory Visit: Payer: No Typology Code available for payment source | Attending: Internal Medicine | Admitting: *Deleted

## 2015-05-29 VITALS — BP 138/86 | HR 84 | Temp 98.5°F | Resp 14 | Ht 72.0 in | Wt 213.6 lb

## 2015-05-29 DIAGNOSIS — R770 Abnormality of albumin: Secondary | ICD-10-CM

## 2015-05-29 DIAGNOSIS — R6882 Decreased libido: Secondary | ICD-10-CM

## 2015-05-29 DIAGNOSIS — R609 Edema, unspecified: Secondary | ICD-10-CM

## 2015-05-29 DIAGNOSIS — IMO0002 Reserved for concepts with insufficient information to code with codable children: Secondary | ICD-10-CM

## 2015-05-29 DIAGNOSIS — Z87891 Personal history of nicotine dependence: Secondary | ICD-10-CM | POA: Diagnosis not present

## 2015-05-29 DIAGNOSIS — R3912 Poor urinary stream: Secondary | ICD-10-CM | POA: Diagnosis not present

## 2015-05-29 DIAGNOSIS — E1165 Type 2 diabetes mellitus with hyperglycemia: Secondary | ICD-10-CM

## 2015-05-29 DIAGNOSIS — I10 Essential (primary) hypertension: Secondary | ICD-10-CM | POA: Diagnosis present

## 2015-05-29 DIAGNOSIS — E118 Type 2 diabetes mellitus with unspecified complications: Secondary | ICD-10-CM

## 2015-05-29 LAB — SEDIMENTATION RATE: Sed Rate: 40 mm/hr — ABNORMAL HIGH (ref 0–15)

## 2015-05-29 LAB — POCT CBG (FASTING - GLUCOSE)-MANUAL ENTRY: GLUCOSE FASTING, POC: 184 mg/dL — AB (ref 70–99)

## 2015-05-29 MED ORDER — TRUEPLUS LANCETS 28G MISC: Status: DC

## 2015-05-29 MED ORDER — TRUE METRIX METER W/DEVICE KIT: PACK | Status: DC

## 2015-05-29 MED ORDER — GLUCOSE BLOOD VI STRP: ORAL_STRIP | Status: DC

## 2015-05-29 NOTE — Patient Instructions (Signed)
Diabetes and Exercise Exercising regularly is important. It is not just about losing weight. It has many health benefits, such as:  Improving your overall fitness, flexibility, and endurance.  Increasing your bone density.  Helping with weight control.  Decreasing your body fat.  Increasing your muscle strength.  Reducing stress and tension.  Improving your overall health. People with diabetes who exercise gain additional benefits because exercise:  Reduces appetite.  Improves the body's use of blood sugar (glucose).  Helps lower or control blood glucose.  Decreases blood pressure.  Helps control blood lipids (such as cholesterol and triglycerides).  Improves the body's use of the hormone insulin by:  Increasing the body's insulin sensitivity.  Reducing the body's insulin needs.  Decreases the risk for heart disease because exercising:  Lowers cholesterol and triglycerides levels.  Increases the levels of good cholesterol (such as high-density lipoproteins [HDL]) in the body.  Lowers blood glucose levels. YOUR ACTIVITY PLAN  Choose an activity that you enjoy and set realistic goals. Your health care provider or diabetes educator can help you make an activity plan that works for you. Exercise regularly as directed by your health care provider. This includes:  Performing resistance training twice a week such as push-ups, sit-ups, lifting weights, or using resistance bands.  Performing 150 minutes of cardio exercises each week such as walking, running, or playing sports.  Staying active and spending no more than 90 minutes at one time being inactive. Even short bursts of exercise are good for you. Three 10-minute sessions spread throughout the day are just as beneficial as a single 30-minute session. Some exercise ideas include:  Taking the dog for a walk.  Taking the stairs instead of the elevator.  Dancing to your favorite song.  Doing an exercise  video.  Doing your favorite exercise with a friend. RECOMMENDATIONS FOR EXERCISING WITH TYPE 1 OR TYPE 2 DIABETES   Check your blood glucose before exercising. If blood glucose levels are greater than 240 mg/dL, check for urine ketones. Do not exercise if ketones are present.  Avoid injecting insulin into areas of the body that are going to be exercised. For example, avoid injecting insulin into:  The arms when playing tennis.  The legs when jogging.  Keep a record of:  Food intake before and after you exercise.  Expected peak times of insulin action.  Blood glucose levels before and after you exercise.  The type and amount of exercise you have done.  Review your records with your health care provider. Your health care provider will help you to develop guidelines for adjusting food intake and insulin amounts before and after exercising.  If you take insulin or oral hypoglycemic agents, watch for signs and symptoms of hypoglycemia. They include:  Dizziness.  Shaking.  Sweating.  Chills.  Confusion.  Drink plenty of water while you exercise to prevent dehydration or heat stroke. Body water is lost during exercise and must be replaced.  Talk to your health care provider before starting an exercise program to make sure it is safe for you. Remember, almost any type of activity is better than none. Document Released: 12/10/2003 Document Revised: 02/03/2014 Document Reviewed: 02/26/2013 [facility] Patient Information 2015 [place], [place]. This information is not intended to replace advice given to you by your health care provider. Make sure you discuss any questions you have with your health care provider. Diabetes Mellitus and Food It is important for you to manage your blood sugar (glucose) level. Your blood glucose level can  be greatly affected by what you eat. Eating healthier foods in the appropriate amounts throughout the day at about the same time each day will help you  control your blood glucose level. It can also help slow or prevent worsening of your diabetes mellitus. Healthy eating may even help you improve the level of your blood pressure and reach or maintain a healthy weight.  HOW CAN FOOD AFFECT ME? Carbohydrates Carbohydrates affect your blood glucose level more than any other type of food. Your dietitian will help you determine how many carbohydrates to eat at each meal and teach you how to count carbohydrates. Counting carbohydrates is important to keep your blood glucose at a healthy level, especially if you are using insulin or taking certain medicines for diabetes mellitus. Alcohol Alcohol can cause sudden decreases in blood glucose (hypoglycemia), especially if you use insulin or take certain medicines for diabetes mellitus. Hypoglycemia can be a life-threatening condition. Symptoms of hypoglycemia (sleepiness, dizziness, and disorientation) are similar to symptoms of having too much alcohol.  If your health care provider has given you approval to drink alcohol, do so in moderation and use the following guidelines:  Women should not have more than one drink per day, and men should not have more than two drinks per day. One drink is equal to:  12 oz of beer.  5 oz of wine.  1 oz of hard liquor.  Do not drink on an empty stomach.  Keep yourself hydrated. Have water, diet soda, or unsweetened iced tea.  Regular soda, juice, and other mixers might contain a lot of carbohydrates and should be counted. WHAT FOODS ARE NOT RECOMMENDED? As you make food choices, it is important to remember that all foods are not the same. Some foods have fewer nutrients per serving than other foods, even though they might have the same number of calories or carbohydrates. It is difficult to get your body what it needs when you eat foods with fewer nutrients. Examples of foods that you should avoid that are high in calories and carbohydrates but low in nutrients  include:  Trans fats (most processed foods list trans fats on the Nutrition Facts label).  Regular soda.  Juice.  Candy.  Sweets, such as cake, pie, doughnuts, and cookies.  Fried foods. WHAT FOODS CAN I EAT? Have nutrient-rich foods, which will nourish your body and keep you healthy. The food you should eat also will depend on several factors, including:  The calories you need.  The medicines you take.  Your weight.  Your blood glucose level.  Your blood pressure level.  Your cholesterol level. You also should eat a variety of foods, including:  Protein, such as meat, poultry, fish, tofu, nuts, and seeds (lean animal proteins are best).  Fruits.  Vegetables.  Dairy products, such as milk, cheese, and yogurt (low fat is best).  Breads, grains, pasta, cereal, rice, and beans.  Fats such as olive oil, trans fat-free margarine, canola oil, avocado, and olives. DOES EVERYONE WITH DIABETES MELLITUS HAVE THE SAME MEAL PLAN? Because every person with diabetes mellitus is different, there is not one meal plan that works for everyone. It is very important that you meet with a dietitian who will help you create a meal plan that is just right for you. Document Released: 06/16/2005 Document Revised: 09/24/2013 Document Reviewed: 08/16/2013 [facility] Patient Information 2015 [place], [place]. This information is not intended to replace advice given to you by your health care provider. Make sure you discuss any  questions you have with your health care provider. Basic Carbohydrate Counting for Diabetes Mellitus Carbohydrate counting is a method for keeping track of the amount of carbohydrates you eat. Eating carbohydrates naturally increases the level of sugar (glucose) in your blood, so it is important for you to know the amount that is okay for you to have in every meal. Carbohydrate counting helps keep the level of glucose in your blood within normal limits. The amount of carbohydrates  allowed is different for every person. A dietitian can help you calculate the amount that is right for you. Once you know the amount of carbohydrates you can have, you can count the carbohydrates in the foods you want to eat. Carbohydrates are found in the following foods:  Grains, such as breads and cereals.  Dried beans and soy products.  Starchy vegetables, such as potatoes, peas, and corn.  Fruit and fruit juices.  Milk and yogurt.  Sweets and snack foods, such as cake, cookies, candy, chips, soft drinks, and fruit drinks. CARBOHYDRATE COUNTING There are two ways to count the carbohydrates in your food. You can use either of the methods or a combination of both. Reading the "Nutrition Facts" on [place] The "Nutrition Facts" is an area that is included on the labels of almost all packaged food and beverages in the [place]. It includes the serving size of that food or beverage and information about the nutrients in each serving of the food, including the grams (g) of carbohydrate per serving.  Decide the number of servings of this food or beverage that you will be able to eat or drink. Multiply that number of servings by the number of grams of carbohydrate that is listed on the label for that serving. The total will be the amount of carbohydrates you will be having when you eat or drink this food or beverage. Learning Standard Serving Sizes of Food When you eat food that is not packaged or does not include "Nutrition Facts" on the label, you need to measure the servings in order to count the amount of carbohydrates.A serving of most carbohydrate-rich foods contains about 15 g of carbohydrates. The following list includes serving sizes of carbohydrate-rich foods that provide 15 g ofcarbohydrate per serving:   1 slice of bread (1 oz) or 1 six-inch tortilla.    of a hamburger bun or English muffin.  4-6 crackers.   cup unsweetened dry cereal.    cup hot cereal.    cup rice or pasta.    cup mashed potatoes or  of a large baked potato.  1 cup fresh fruit or one small piece of fruit.    cup canned or frozen fruit or fruit juice.  1 cup milk.   cup plain fat-free yogurt or yogurt sweetened with artificial sweeteners.   cup cooked dried beans or starchy vegetable, such as peas, corn, or potatoes.  Decide the number of standard-size servings that you will eat. Multiply that number of servings by 15 (the grams of carbohydrates in that serving). For example, if you eat 2 cups of strawberries, you will have eaten 2 servings and 30 g of carbohydrates (2 servings x 15 g = 30 g). For foods such as soups and casseroles, in which more than one food is mixed in, you will need to count the carbohydrates in each food that is included. EXAMPLE OF CARBOHYDRATE COUNTING Sample Dinner  3 oz chicken breast.   cup of brown rice.   cup of corn.  1 cup milk.   1 cup strawberries with sugar-free whipped topping.  Carbohydrate Calculation Step 1: Identify the foods that contain carbohydrates:   Rice.   Corn.   Milk.   Strawberries. Step 2:Calculate the number of servings eaten of each:   2 servings of rice.   1 serving of corn.   1 serving of milk.   1 serving of strawberries. Step 3: Multiply each of those number of servings by 15 g:   2 servings of rice x 15 g = 30 g.   1 serving of corn x 15 g = 15 g.   1 serving of milk x 15 g = 15 g.   1 serving of strawberries x 15 g = 15 g. Step 4: Add together all of the amounts to find the total grams of carbohydrates eaten: 30 g + 15 g + 15 g + 15 g = 75 g. Document Released: 09/19/2005 Document Revised: 02/03/2014 Document Reviewed: 08/16/2013 [facility] Patient Information 2015 [place], [place]. This information is not intended to replace advice given to you by your health care provider. Make sure you discuss any questions you have with your health care provider. Type 2 Diabetes  Mell[place] Type 2 diabetes mellitus, often simply referred to as type 2 diabetes, is a long-lasting (chronic) disease. In type 2 diabetes, the pancreas does not make enough insulin (a hormone), the cells are less responsive to the insulin that is made (insulin resistance), or both. Normally, insulin moves sugars from food into the tissue cells. The tissue cells use the sugars for energy. The lack of insulin or the lack of normal response to insulin causes excess sugars to build up in the blood instead of going into the tissue cells. As a result, high blood sugar (hyperglycemia) develops. The effect of high sugar (glucose) levels can cause many complications. Type 2 diabetes was also previously called adult-onset diabetes, but it can occur at any age.  RISK FACTORS  A person is predisposed to developing type 2 diabetes if someone in the family has the disease and also has one or more of the following primary risk factors:  Overweight.  An inactive lifestyle.  A history of consistently eating high-calorie foods. Maintaining a normal weight and regular physical activity can reduce the chance of developing type 2 diabetes. SYMPTOMS  A person with type 2 diabetes may not show symptoms initially. The symptoms of type 2 diabetes appear slowly. The symptoms include:  Increased thirst (polydipsia).  Increased urination (polyuria).  Increased urination during the night (nocturia).  Weight loss. This weight loss may be rapid.  Frequent, recurring infections.  Tiredness (fatigue).  Weakness.  Vision changes, such as blurred vision.  Fruity smell to your breath.  Abdominal pain.  Nausea or vomiting.  Cuts or bruises which are slow to heal.  Tingling or numbness in the hands or feet. DIAGNOSIS Type 2 diabetes is frequently not diagnosed until complications of diabetes are present. Type 2 diabetes is diagnosed when symptoms or complications are present and when blood glucose levels are  increased. Your blood glucose level may be checked by one or more of the following blood tests:  A fasting blood glucose test. You will not be allowed to eat for at least 8 hours before a blood sample is taken.  A random blood glucose test. Your blood glucose is checked at any time of the day regardless of when you ate.  A hemoglobin A1c blood glucose test. A hemoglobin A1c test provides information  about blood glucose control over the previous 3 months.  An oral glucose tolerance test (OGTT). Your blood glucose is measured after you have not eaten (fasted) for 2 hours and then after you drink a glucose-containing beverage. TREATMENT   You may need to take insulin or diabetes medicine daily to keep blood glucose levels in the desired range.  If you use insulin, you may need to adjust the dosage depending on the carbohydrates that you eat with each meal or snack. The treatment goal is to maintain the before meal blood sugar (preprandial glucose) level at 70-130 mg/dL. HOME CARE INSTRUCTIONS   Have your hemoglobin A1c level checked twice a year.  Perform daily blood glucose monitoring as directed by your health care provider.  Monitor urine ketones when you are ill and as directed by your health care provider.  Take your diabetes medicine or insulin as directed by your health care provider to maintain your blood glucose levels in the desired range.  Never run out of diabetes medicine or insulin. It is needed every day.  If you are using insulin, you may need to adjust the amount of insulin given based on your intake of carbohydrates. Carbohydrates can raise blood glucose levels but need to be included in your diet. Carbohydrates provide vitamins, minerals, and fiber which are an essential part of a healthy diet. Carbohydrates are found in fruits, vegetables, whole grains, dairy products, legumes, and foods containing added sugars.  Eat healthy foods. You should make an appointment to see a  registered dietitian to help you create an eating plan that is right for you.  Lose weight if you are overweight.  Carry a medical alert card or wear your medical alert jewelry.  Carry a 15-gram carbohydrate snack with you at all times to treat low blood glucose (hypoglycemia). Some examples of 15-gram carbohydrate snacks include:  Glucose tablets, 3 or 4.  Glucose gel, 15-gram tube.  Raisins, 2 tablespoons (24 grams).  Jelly beans, 6.  Animal crackers, 8.  Regular pop, 4 ounces (120 mL).  Gummy treats, 9.  Recognize hypoglycemia. Hypoglycemia occurs with blood glucose levels of 70 mg/dL and below. The risk for hypoglycemia increases when fasting or skipping meals, during or after intense exercise, and during sleep. Hypoglycemia symptoms can include:  Tremors or shakes.  Decreased ability to concentrate.  Sweating.  Increased heart rate.  Headache.  Dry mouth.  Hunger.  Irritability.  Anxiety.  Restless sleep.  Altered speech or coordination.  Confusion.  Treat hypoglycemia promptly. If you are alert and able to safely swallow, follow the 15:15 rule:  Take 15-20 grams of rapid-acting glucose or carbohydrate. Rapid-acting options include glucose gel, glucose tablets, or 4 ounces (120 mL) of fruit juice, regular soda, or low-fat milk.  Check your blood glucose level 15 minutes after taking the glucose.  Take 15-20 grams more of glucose if the repeat blood glucose level is still 70 mg/dL or below.  Eat a meal or snack within 1 hour once blood glucose levels return to normal.  Be alert to feeling very thirsty and urinating more frequently than usual, which are early signs of hyperglycemia. An early awareness of hyperglycemia allows for prompt treatment. Treat hyperglycemia as directed by your health care provider.  Engage in at least 150 minutes of moderate-intensity physical activity a week, spread over at least 3 days of the week or as directed by your  health care provider. In addition, you should engage in resistance exercise at least 2 times  a week or as directed by your health care provider. Try to spend no more than 90 minutes at one time inactive.  Adjust your medicine and food intake as needed if you start a new exercise or sport.  Follow your sick-day plan anytime you are unable to eat or drink as usual.  Do not use any tobacco products including cigarettes, chewing tobacco, or electronic cigarettes. If you need help quitting, ask your health care provider.  Limit alcohol intake to no more than 1 drink per day for nonpregnant women and 2 drinks per day for men. You should drink alcohol only when you are also eating food. Talk with your health care provider whether alcohol is safe for you. Tell your health care provider if you drink alcohol several times a week.  Keep all follow-up visits as directed by your health care provider. This is important.  Schedule an eye exam soon after the diagnosis of type 2 diabetes and then annually.  Perform daily skin and foot care. Examine your skin and feet daily for cuts, bruises, redness, nail problems, bleeding, blisters, or sores. A foot exam by a health care provider should be done annually.  Brush your teeth and gums at least twice a day and floss at least once a day. Follow up with your dentist regularly.  Share your diabetes management plan with your workplace or school.  Stay up-to-date with immunizations. It is recommended that people with diabetes who are over 57 years old get the pneumonia vaccine. In some cases, two separate shots may be given. Ask your health care provider if your pneumonia vaccination is up-to-date.  Learn to manage stress.  Obtain ongoing diabetes education and support as needed.  Participate in or seek rehabilitation as needed to maintain or improve independence and quality of life. Request a physical or occupational therapy referral if you are having foot or  hand numbness, or difficulties with grooming, dressing, eating, or physical activity. SEEK MEDICAL CARE IF:   You are unable to eat food or drink fluids for more than 6 hours.  You have nausea and vomiting for more than 6 hours.  Your blood glucose level is over 240 mg/dL.  There is a change in mental status.  You develop an additional serious illness.  You have diarrhea for more than 6 hours.  You have been sick or have had a fever for a couple of days and are not getting better.  You have pain during any physical activity.  SEEK IMMEDIATE MEDICAL CARE IF:  You have difficulty breathing.  You have moderate to large ketone levels. MAKE SURE YOU:  Understand these instructions.  Will watch your condition.  Will get help right away if you are not doing well or get worse. Document Released: 09/19/2005 Document Revised: 02/03/2014 Document Reviewed: 04/17/2012 [facility] Patient Information 2015 [place], [place]. This information is not intended to replace advice given to you by your health care provider. Make sure you discuss any questions you have with your health care provider. DASH Eating Plan DASH stands for "Dietary Approaches to Stop Hypertension." The DASH eating plan is a healthy eating plan that has been shown to reduce high blood pressure (hypertension). Additional health benefits may include reducing the risk of type 2 diabetes mellitus, heart disease, and stroke. The DASH eating plan may also help with weight loss. WHAT DO I NEED TO KNOW ABOUT THE DASH EATING PLAN? For the DASH eating plan, you will follow these general guidelines:  Choose foods with  a percent daily value for sodium of less than 5% (as listed on the food label).  Use salt-free seasonings or herbs instead of table salt or sea salt.  Check with your health care provider or pharmacist before using salt substitutes.  Eat lower-sodium products, often labeled as "lower sodium" or "no salt added."  Eat  fresh foods.  Eat more vegetables, fruits, and low-fat dairy products.  Choose whole grains. Look for the word "whole" as the first word in the ingredient list.  Choose fish and skinless chicken or [place] more often than red meat. Limit fish, poultry, and meat to 6 oz (170 g) each day.  Limit sweets, desserts, sugars, and sugary drinks.  Choose heart-healthy fats.  Limit cheese to 1 oz (28 g) per day.  Eat more home-cooked food and less restaurant, buffet, and fast food.  Limit fried foods.  Cook foods using methods other than frying.  Limit canned vegetables. If you do use them, rinse them well to decrease the sodium.  When eating at a restaurant, ask that your food be prepared with less salt, or no salt if possible. WHAT FOODS CAN I EAT? Seek help from a dietitian for individual calorie needs. Grains Whole grain or whole wheat bread. Brown rice. Whole grain or whole wheat pasta. Quinoa, bulgur, and whole grain cereals. Low-sodium cereals. Corn or whole wheat flour tortillas. Whole grain cornbread. Whole grain crackers. Low-sodium crackers. Vegetables Fresh or frozen vegetables (raw, steamed, roasted, or grilled). Low-sodium or reduced-sodium tomato and vegetable juices. Low-sodium or reduced-sodium tomato sauce and paste. Low-sodium or reduced-sodium canned vegetables.  Fruits All fresh, canned (in natural juice), or frozen fruits. Meat and Other Protein Products Ground beef (85% or leaner), grass-fed beef, or beef trimmed of fat. Skinless chicken or [place]. Ground chicken or [place]. Pork trimmed of fat. All fish and seafood. Eggs. Dried beans, peas, or lentils. Unsalted nuts and seeds. Unsalted canned beans. Dairy Low-fat dairy products, such as skim or 1% milk, 2% or reduced-fat cheeses, low-fat ricotta or cottage cheese, or plain low-fat yogurt. Low-sodium or reduced-sodium cheeses. Fats and Oils Tub margarines without trans fats. Light or reduced-fat mayonnaise and salad  dressings (reduced sodium). Avocado. Safflower, olive, or canola oils. Natural peanut or almond butter. Other Unsalted popcorn and pretzels. The items listed above may not be a complete list of recommended foods or beverages. Contact your dietitian for more options. WHAT FOODS ARE NOT RECOMMENDED? Grains White bread. White pasta. White rice. Refined cornbread. Bagels and croissants. Crackers that contain trans fat. Vegetables Creamed or fried vegetables. Vegetables in a cheese sauce. Regular canned vegetables. Regular canned tomato sauce and paste. Regular tomato and vegetable juices. Fruits Dried fruits. Canned fruit in light or heavy syrup. Fruit juice. Meat and Other Protein Products Fatty cuts of meat. Ribs, chicken wings, bacon, sausage, bologna, salami, chitterlings, fatback, hot dogs, bratwurst, and packaged luncheon meats. Salted nuts and seeds. Canned beans with salt. Dairy Whole or 2% milk, cream, half-and-half, and cream cheese. Whole-fat or sweetened yogurt. Full-fat cheeses or blue cheese. Nondairy creamers and whipped toppings. Processed cheese, cheese spreads, or cheese curds. Condiments Onion and garlic salt, seasoned salt, table salt, and sea salt. Canned and packaged gravies. Worcestershire sauce. Tartar sauce. Barbecue sauce. Teriyaki sauce. Soy sauce, including reduced sodium. Steak sauce. Fish sauce. Oyster sauce. Cocktail sauce. Horseradish. Ketchup and mustard. Meat flavorings and tenderizers. Bouillon cubes. Hot sauce. Tabasco sauce. Marinades. Taco seasonings. Relishes. Fats and Oils Butter, stick margarine, lard, shortening, ghee, and bacon  fat. Coconut, palm kernel, or palm oils. Regular salad dressings. Other Pickles and olives. Salted popcorn and pretzels. The items listed above may not be a complete list of foods and beverages to avoid. Contact your dietitian for more information. WHERE CAN I FIND MORE INFORMATION? National Heart, Lung, and Blood Institute:  travelstabloid.com Document Released: 09/08/2011 Document Revised: 02/03/2014 Document Reviewed: 07/24/2013 [facility] Patient Information 2015 [place], [place]. This information is not intended to replace advice given to you by your health care provider. Make sure you discuss any questions you have with your health care provider.

## 2015-05-29 NOTE — Progress Notes (Signed)
Patient presents for BP check and blood work Med list reviewed; states taking all meds as directed Taking lasix 20 mg daily, K-Dur 10 mEq daily and glimepiride 2 mg daily Discussed need for low sodium diet and using Mrs. Dash as alternative to salt Encouraged to choose foods with 5% or less of daily value for sodium. States walks all day long for job 7.5 hours per day 6 days per week Patient positive for headaches, blurred vision, intermittent SHOB, intermittent chest pain  Patient states he has not eaten yet today and it is 4:30 pm. States this happens about 3 days per week.  Discussed importance of eating 3 meals per day and snacking in between. Patient states he will try. Denies increased thirst and urination Patient not checking BS due to no meter. Meter and supplies [facility]scribed [facility] Patient given blood sugar log and instructed on use. Instructed to bring to all future visits.   CBG 184 after not eating all day  Filed Vitals:   05/29/15 1659  BP: 138/86  Pulse: 84  Temp: 98.5 F (36.9 C)  Resp: 14     Per PCP: No changes to meds at this time Return in 3-4 weeks for nurse visit for CBG check and log review  Patient given literature on DASH Eating Plan, T2DM, Diabetes and Food, Diabetes and Exercise, and Basic Carb Counting   Patient given literature on Fit2Me.com for customized diet and lifestyle support

## 2015-05-30 LAB — COMPLETE METABOLIC PANEL WITH GFR
ALK PHOS: 94 U/L (ref 40–115)
ALT: 24 U/L (ref 9–46)
AST: 24 U/L (ref 10–40)
Albumin: 3.4 g/dL — ABNORMAL LOW (ref 3.6–5.1)
BUN: 26 mg/dL — ABNORMAL HIGH (ref 7–25)
CALCIUM: 8.7 mg/dL (ref 8.6–10.3)
CO2: 22 mmol/L (ref 20–31)
Chloride: 108 mmol/L (ref 98–110)
Creat: 1.45 mg/dL — ABNORMAL HIGH (ref 0.60–1.35)
GFR, EST NON AFRICAN AMERICAN: 58 mL/min — AB (ref >=60)
GFR, Est African American: 67 mL/min (ref >=60)
Glucose, Bld: 179 mg/dL — ABNORMAL HIGH (ref 65–99)
POTASSIUM: 4.6 mmol/L (ref 3.5–5.3)
Sodium: 140 mmol/L (ref 135–146)
Total Bilirubin: 0.4 mg/dL (ref 0.2–1.2)
Total Protein: 6.1 g/dL (ref 6.1–8.1)

## 2015-05-30 LAB — LIPID PANEL
Cholesterol: 204 mg/dL — ABNORMAL HIGH (ref 125–200)
HDL: 87 mg/dL (ref >=40)
LDL Cholesterol: 101 mg/dL (ref <130)
TRIGLYCERIDES: 82 mg/dL (ref <150)
Total CHOL/HDL Ratio: 2.3 Ratio (ref <=5.0)
VLDL: 16 mg/dL (ref <30)

## 2015-05-30 LAB — HEPATIC FUNCTION PANEL
ALT: 24 U/L (ref 9–46)
AST: 24 U/L (ref 10–40)
Albumin: 3.4 g/dL — ABNORMAL LOW (ref 3.6–5.1)
Alkaline Phosphatase: 94 U/L (ref 40–115)
BILIRUBIN DIRECT: 0.1 mg/dL (ref <=0.2)
BILIRUBIN INDIRECT: 0.3 mg/dL (ref 0.2–1.2)
Total Bilirubin: 0.4 mg/dL (ref 0.2–1.2)
Total Protein: 6.1 g/dL (ref 6.1–8.1)

## 2015-05-30 LAB — TSH

## 2015-05-30 LAB — T4, FREE: FREE T4: 1.27 ng/dL (ref 0.80–1.80)

## 2015-05-30 LAB — TESTOSTERONE: Testosterone: 256 ng/dL — ABNORMAL LOW (ref 300–890)

## 2015-05-30 LAB — PSA: PSA: 1.09 ng/mL (ref <=4.00)

## 2015-05-30 LAB — ALBUMIN: Albumin: 3.4 g/dL — ABNORMAL LOW (ref 3.6–5.1)

## 2015-06-01 ENCOUNTER — Telehealth: Payer: Self-pay | Admitting: Clinical

## 2015-06-01 LAB — C3 AND C4
C3 Complement: 157 mg/dL (ref 90–180)
C4 Complement: 47 mg/dL — ABNORMAL HIGH (ref 10–40)

## 2015-06-01 LAB — PTH, INTACT AND CALCIUM
CALCIUM: 8.7 mg/dL (ref 8.4–10.5)
PTH: 98 pg/mL — ABNORMAL HIGH (ref 14–64)

## 2015-06-01 LAB — ANA: Anti Nuclear Antibody(ANA): NEGATIVE

## 2015-06-01 NOTE — Telephone Encounter (Signed)
Left HIPPA-compliant message for pt to return call to [facility] from [facility] at [phone]; attempt to f/u with pt.

## 2015-06-02 ENCOUNTER — Telehealth: Payer: Self-pay

## 2015-06-02 DIAGNOSIS — R7989 Other specified abnormal findings of blood chemistry: Secondary | ICD-10-CM

## 2015-06-02 DIAGNOSIS — S37009D Unspecified injury of unspecified kidney, subsequent encounter: Secondary | ICD-10-CM

## 2015-06-02 NOTE — Telephone Encounter (Signed)
-----   Message from Lance Bosch, NP sent at 06/02/2015 11:36 AM EDT ----- Due to blood test, I believe patients persistent swelling is due to kidney damage. It also appears that he has a really low TSH which is indicative of hyperthyroidism. Find out if he is feels anxious, losing weight, or having heart palpitations. Does he have insurance? He will need a Nephrology referral very soon--if no insurance first visit is $100 and then they work with him on payments after that. I would also like him to see endocrinology for thyroid. If no insurance he needs hospital discount. Please place orders

## 2015-06-02 NOTE — Telephone Encounter (Signed)
Patient not available Left message on voice mail to return our call Referrals for nephrology and edocrine placed in epic

## 2015-06-17 ENCOUNTER — Ambulatory Visit: Payer: No Typology Code available for payment source | Admitting: Endocrinology

## 2015-07-13 ENCOUNTER — Other Ambulatory Visit: Payer: No Typology Code available for payment source

## 2015-07-13 ENCOUNTER — Ambulatory Visit: Payer: No Typology Code available for payment source | Admitting: Family Medicine

## 2015-09-11 ENCOUNTER — Emergency Department (HOSPITAL_COMMUNITY)
Admission: EM | Admit: 2015-09-11 | Discharge: 2015-09-11 | Disposition: A | Discharge status: Home / Self Care | Payer: No Typology Code available for payment source | Attending: Emergency Medicine | Admitting: Emergency Medicine

## 2015-09-11 ENCOUNTER — Encounter (HOSPITAL_COMMUNITY): Payer: Self-pay | Admitting: Emergency Medicine

## 2015-09-11 DIAGNOSIS — M25511 Pain in right shoulder: Secondary | ICD-10-CM | POA: Insufficient documentation

## 2015-09-11 DIAGNOSIS — M79642 Pain in left hand: Secondary | ICD-10-CM | POA: Insufficient documentation

## 2015-09-11 DIAGNOSIS — Z7984 Long term (current) use of oral hypoglycemic drugs: Secondary | ICD-10-CM | POA: Insufficient documentation

## 2015-09-11 DIAGNOSIS — M25562 Pain in left knee: Secondary | ICD-10-CM | POA: Insufficient documentation

## 2015-09-11 DIAGNOSIS — M542 Cervicalgia: Secondary | ICD-10-CM | POA: Insufficient documentation

## 2015-09-11 DIAGNOSIS — M25512 Pain in left shoulder: Secondary | ICD-10-CM | POA: Insufficient documentation

## 2015-09-11 DIAGNOSIS — M2550 Pain in unspecified joint: Secondary | ICD-10-CM

## 2015-09-11 DIAGNOSIS — Z79899 Other long term (current) drug therapy: Secondary | ICD-10-CM | POA: Insufficient documentation

## 2015-09-11 DIAGNOSIS — Z87891 Personal history of nicotine dependence: Secondary | ICD-10-CM | POA: Insufficient documentation

## 2015-09-11 DIAGNOSIS — I10 Essential (primary) hypertension: Secondary | ICD-10-CM | POA: Insufficient documentation

## 2015-09-11 DIAGNOSIS — E119 Type 2 diabetes mellitus without complications: Secondary | ICD-10-CM | POA: Insufficient documentation

## 2015-09-11 DIAGNOSIS — M25522 Pain in left elbow: Secondary | ICD-10-CM | POA: Insufficient documentation

## 2015-09-11 MED ORDER — IBUPROFEN 800 MG PO TABS: 800.0000 mg | ORAL_TABLET | Freq: Three times a day (TID) | ORAL | Status: DC | PRN

## 2015-09-11 MED ORDER — METHOCARBAMOL 500 MG PO TABS: 500.0000 mg | ORAL_TABLET | Freq: Two times a day (BID) | ORAL | Status: DC

## 2015-09-11 NOTE — Discharge Instructions (Signed)
Please follow up with a primary care provider or rheumatologist for further evaluation of your recurrent joint pain.  Take ibuprofen and robaxin as needed for pain.  Return if you notice fever, rash, or increase joint swelling.  Joint Pain Joint pain, which is also called arthralgia, can be caused by many things. Joint pain often goes away when you follow your health care provider's instructions for relieving pain at home. However, joint pain can also be caused by conditions that require further treatment. Common causes of joint pain include:  Bruising in the area of the joint.  Overuse of the joint.  Wear and tear on the joints that occur with aging (osteoarthritis).  Various other forms of arthritis.  A buildup of a crystal form of uric acid in the joint (gout).  Infections of the joint (septic arthritis) or of the bone (osteomyelitis). Your health care provider may recommend medicine to help with the pain. If your joint pain continues, additional tests may be needed to diagnose your condition. HOME CARE INSTRUCTIONS Watch your condition for any changes. Follow these instructions as directed to lessen the pain that you are feeling.  Take medicines only as directed by your health care provider.  Rest the affected area for as long as your health care provider says that you should. If directed to do so, raise the painful joint above the level of your heart while you are sitting or lying down.  Do not do things that cause or worsen pain.  If directed, apply ice to the painful area:  Put ice in a plastic bag.  Place a towel between your skin and the bag.  Leave the ice on for 20 minutes, 2-3 times per day.  Wear an elastic bandage, splint, or sling as directed by your health care provider. Loosen the elastic bandage or splint if your fingers or toes become numb and tingle, or if they turn cold and blue.  Begin exercising or stretching the affected area as directed by your health care  provider. Ask your health care provider what types of exercise are safe for you.  Keep all follow-up visits as directed by your health care provider. This is important. SEEK MEDICAL CARE IF:  Your pain increases, and medicine does not help.  Your joint pain does not improve within 3 days.  You have increased bruising or swelling.  You have a fever.  You lose 10 lb (4.5 kg) or more without trying. SEEK IMMEDIATE MEDICAL CARE IF:  You are not able to move the joint.  Your fingers or toes become numb or they turn cold and blue.   This information is not intended to replace advice given to you by your health care provider. Make sure you discuss any questions you have with your health care provider.   Document Released: 09/19/2005 Document Revised: 10/10/2014 Document Reviewed: 07/01/2014 Elsevier Interactive Patient Education 2016 [employer].   Emergency Department Resource Guide 1) Find a Doctor and Pay Out of Pocket Although you won't have to find out who is covered by your insurance plan, it is a good idea to ask around and get recommendations. You will then need to call the office and see if the doctor you have chosen will accept you as a new patient and what types of options they offer for patients who are self-pay. Some doctors offer discounts or will set up payment plans for their patients who do not have insurance, but you will need to ask so you aren't surprised  when you get to your appointment.  2) Contact Your Local Health Department Not all health departments have doctors that can see patients for sick visits, but many do, so it is worth a call to see if yours does. If you don't know where your local health department is, you can check in your phone book. The CDC also has a tool to help you locate your state's health department, and many state websites also have listings of all of their local health departments.  3) Find a [facility] If your illness is not likely to  be very severe or complicated, you may want to try a walk in clinic. These are popping up all over the country in pharmacies, drugstores, and shopping centers. They're usually staffed by nurse practitioners or physician assistants that have been trained to treat common illnesses and complaints. They're usually fairly quick and inexpensive. However, if you have serious medical issues or chronic medical problems, these are probably not your best option.  No Primary Care Doctor: - Call Health Connect at  [phone] - they can help you locate a primary care doctor that  accepts your insurance, provides certain services, etc. - Physician Referral Service- [phone]  Chronic Pain Problems: Organization         Address  Phone   Notes  [facility]  [phone] Patients need to be referred by their primary care doctor.   Medication Assistance: Organization         Address  Phone   Notes  [facility] (2-Rh) Medication [facility] [place] [place], [place] [address] [phone] --Must be a resident of [facility] -- Must have NO insurance coverage whatsoever (no Medicaid/ Medicare, etc.) -- The pt. MUST have a primary care doctor that directs their care regularly and follows them in the community   MedAssist  [phone]   [employer]  [phone]    Agencies that provide inexpensive medical care: Organization         Address  Phone   Notes  [place]  [phone]   [facility]    [phone]   [facility] [place], [place] [address] [phone]   [place] [address], [place] [phone]   Planned Parenthood    [phone]   [facility]    [phone]   [place] [address], [place] Phone:  [phone], Fax:  [phone] Hours of Operation:  9 am - 6 pm, M-F.  Also  accepts Medicaid/Medicare and self-pay.  [facility] for [place], [address], [place] Phone: [phone], Fax: [phone]. Hours of Operation:  8:30 am - 5:30 pm, M-F.  Also accepts Medicaid and self-pay.  [facility] [place] [address], [place] Phone: [phone]   [place], [place], [place] [phone], Ext. [phone] Mondays & Thursdays: 7-9 AM.  First 15 patients are seen on a first come, first serve basis.    [place] Providers:  Organization         Address  Phone   Notes  [facility] [address], [place] [phone] Also accepts self-pay patients.  [place], [place]  [phone]   [place]  [place], [address], [place] [phone]   [place] [address], [place] [phone]   Lucianne Lei [address], [place]   [phone] Only accepts [place] Access [place] patients after they have their name applied to their card.   Self-Pay (no insurance) in [facility]:  Organization         Address  Phone   Notes  Sickle Cell Patients, [facility] [place] [phone]   [facility] [place] [phone]   [facility] [place]  [place], [place], [place] [phone]   [facility]/Dr. Osei-Bonsu  [address], [place] or [address], [place] [phone] Phone number for both [place] and [place] locations is the same.  [facility] [address], [place] [phone]   [facility] [address] [place] or [address] [phone]   [facility] [address],  [place] [phone], phone; [phone], fax Sees patients 1st and 3rd Saturday of every month.  Must not qualify for public or private insurance (i.e. Medicaid, Medicare, [place] Health Choice, Veterans' Benefits)  Household income should be no more than 200% of the poverty level The clinic cannot treat you if you are pregnant or think you are pregnant  Sexually transmitted diseases are not treated at the clinic.    Dental Care: Organization         Address  Phone  Notes  [facility] Department of [facility] [phone] Accepts children up to age 52 who are enrolled in [place] or [place]; pregnant women with a Medicaid card; and children who have applied for Medicaid or [place] Health Choice, but were declined, whose parents can pay a reduced fee at time of service.  [facility] Department of [facility]  [address], [place] [phone] Accepts children up to age 37 who are enrolled in [place] or [place]; pregnant women with a Medicaid card; and children who have applied for Medicaid or [place] Health Choice, but were declined, whose parents can pay a reduced fee at time of service.  [place] Adult Dental Access PROGRAM  [place] [phone] Patients are seen by appointment only. Walk-ins are not accepted. [place] will see patients 46 years of age and older. Monday - Tuesday (8am-5pm) Most Wednesdays (8:30-5pm) $30 per visit, cash only  [facility] Adult Dental Access PROGRAM  [address], [place] [facility] [place] [phone] Patients are seen by appointment only. Walk-ins are not accepted. [place] will see patients 8 years of age and older. One Wednesday Evening (Monthly: Volunteer Based).  $30 per visit, cash only  [place]  [phone] for adults; Children under age 30, call Graduate Pediatric Dentistry at [phone].  Children aged 38-14, please call [phone] to request a pediatric application.  Dental services are provided in all areas of dental care including fillings, crowns and bridges, complete and partial dentures, implants, gum treatment, root canals, and extractions. Preventive care is also provided. Treatment is provided to both adults and children. Patients are selected via a lottery and there is often a waiting list.  [facility] [address], [place]  [phone] [email or web address]   [facility] [address] [place], [place] [phone], Ext. [phone] Second and Fourth Thursday of each month, opens at 6:30 AM; Clinic ends at 9 AM.  Patients are seen on a first-come first-served basis, and a limited number are seen during each clinic.   [facility]  [address] Hillard Danker [place], [place] [phone]   Eligibility Requirements You must have lived in [place], [place], or [place] counties for at least the last three months.   You cannot be eligible for state or federal sponsored [employer], including [employer], [place], or [employer].   You generally cannot be eligible for healthcare insurance through your employer.    How to apply: Eligibility screenings are held every Tuesday and Wednesday afternoon from 1:00 pm until 4:00 pm. You do not need an appointment for the interview!  [facility] [address], [place], [place]   [place]  [place] Department  [place]  [phone]    Behavioral Health Resources in the Community: Intensive Outpatient Programs Organization         Address  Phone  Notes  [place]. [address] [place], [place] [phone]   [facility] Outpatient [address], [address], [place]   ADS: Alcohol & Drug S[facility]cs [address], [place], [place]   [place] [address],  [place], [place] or [phone]   Substance Abuse Resources Organization         Address  Phone  Notes  Alcohol and Drug Services  [phone]   [place]  [phone]   [facility] [place]   Chinita Pester  [phone]   Residential & Outpatient Substance Abuse Program  [phone]   Psychological Services Organization         Address  Phone  Notes  [facility] [place]  [place]  [phone]   [place] [address] [place] or [phone]    Mobile Crisis Teams Organization         Address  Phone  Notes  [facility], Mobile Crisis Care Unit  [phone]   [facility] Psychotherapeutic Services  [address] [place], [place]   [place] [address], [place] [phone]    Self-Help/Support Groups Organization         Address  Phone             Notes  [place]. of [place] - variety of support groups  [place] Call for more information  Narcotics Anonymous (NA), [facility] [address], [employer] [place]  2 meetings at this location   Special educational needs teacher         Address  Phone  Notes  [facility] Residential Treatment [place],    [place]  [phone]   [facility]  [address], [place] [phone], [place], [place]   [place], [place] [phone] Admissions: 8am-3pm M-F  Incentives Substance [place] [address],    [place], [place] [phone]   [facility] [address] [place], [place], [place]   The [place].,  [place], [place] city    [facility] - Intensive Outpatient [address], [place], [place]     [facility] ([place]  [place], [place] [phone] or [phone]   [facility] Services (RTS) [address], [place], [place] Accepts Medicaid  [facility] [place] [address],  [place] [phone] Substance Abuse/Addiction Treatment   [facility] Organization         Address  Phone  Notes  [facility]  [phone]   Domenic Schwab, PhD [address] Arlis Porta [place], [place]   [phone] or [phone]   [place], [place] [phone]   [facility] [address] [place], [place] [phone] Insurance/Medicaid/sponsorship through [facility] and Families [address], [address]                                    [place], [place] [phone] [place] [address], [place] [phone]    Dr. Adele Schilder  [phone]   [facility] Dept. 1) [address] [place] 2) [place] 3)  [place] [address], [place] [phone]  [phone]   [place] [phone] or [phone] (After Hours)

## 2015-09-11 NOTE — ED Notes (Signed)
C/o neck, bilateral shoulder and knee pain. Onset "a long time". Hx of NIDDM. No PCP.

## 2015-09-11 NOTE — ED Provider Notes (Signed)
CSN: [id number]     Arrival date & time 09/11/15  2725 History  By signing my name below, I, Edward [name], attest that this documentation has been prepared under the direction and in the presence of [name], PA-C.  Electronically Signed: [name], ED Scribe. 09/11/2015. 10:18 AM.   Chief Complaint  Patient presents with  . Joint Pain   The history is provided by the patient. No language interpreter was used.   HPI Comments: Edward [name] is a 44 y.o. male, with a PMhx of DM and HTN, who presents to the Emergency Department complaining of diffuse arthralgias on left side of body that is chronic in nature for 'years'. Pt reports constant, severe, sharp and aching pain in left knee, left hand, left elbow, left shoulder, and left side of neck. He notes radiation of the left shoulder pain to his right shoulder over the past several days without injury. The pt was prompted to the ED today for constant, sharp and aching, worsening right shoulder pain with decreased ROM of BUE and neck secondary to pain. He has tried soaking the affected areas in a warm bath and applying topical 'sports' ointment without relief. 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 Denies fevers or chills, numbness, weakness, rashes or overlying skin changes, h/o IV drug abuse or PMhx of cancer.   Past Medical History  Diagnosis Date  . Diabetes mellitus   . Hypertension    Past Surgical History  Procedure Laterality Date  . Eye surgery     Family History  Problem Relation Age of Onset  . Hypertension Mother   . Diabetes Mother    Social History  Substance Use Topics  . Smoking status: Former Smoker -- 0.75 packs/day for 3 years    Types: Cigarettes    Quit date: 05/28/2000  . Smokeless tobacco: None  . Alcohol Use: 0.0 oz/week    0 Standard drinks or equivalent per week     Comment: 1 beer every night     Review of Systems  Constitutional: Negative for fever and chills.  Musculoskeletal: Positive for arthralgias (Left knee, hand, elbow, shoulder. Right shoulder.). Negative for gait problem.  Skin: Negative for color change and rash.  Neurological: Negative for weakness and numbness.   Allergies  Review of patient's allergies indicates no known allergies.  Home Medications   Prior to Admission medications   Medication Sig Start Date End Date Taking? Authorizing Provider  Blood Glucose Monitoring Suppl (TRUE METRIX METER) W/DEVICE KIT Check blood sugar before meals and at bedtime 05/29/15   Lance Bosch, NP  cyclobenzaprine (FLEXERIL) 10 MG tablet Take 1 tablet (10 mg total) by mouth 3 (three) times daily as needed for muscle spasms. Patient not taking: Reported on 05/29/2015 3/66/44   Delora Fuel, MD  furosemide (LASIX) 20 MG tablet Take 1 tablet (20 mg total) by mouth daily. 05/05/15   Lance Bosch, NP  glimepiride (AMARYL) 2 MG tablet Take 1 tablet (2 mg total) by mouth daily with breakfast. 05/05/15   Lance Bosch, NP  glucose blood (TRUE METRIX BLOOD GLUCOSE TEST) test strip Use as instructed 05/29/15   Lance Bosch, NP  potassium chloride (K-DUR) 10 MEQ tablet Take 1 tablet (10 mEq total) by mouth daily. Must take with furosemid 05/05/15   Lance Bosch, NP  TRUEPLUS LANCETS 28G MISC Check blood  sugar before meals and at bedtime 05/29/15   Lance Bosch, NP   BP 181/88 mmHg  Pulse 82  Temp(Src) 98.3 F (36.8 C) (Oral)  Resp 18  Ht 6' (1.829 m)  Wt 220 lb (99.791 kg)  BMI 29.83 kg/m2  SpO2 100% Physical Exam  Constitutional: He is oriented to person, place, and time. He appears well-developed and well-nourished. No distress.  HENT:  Head: Normocephalic.  Eyes: Conjunctivae are normal.  Neck: Normal range of motion. Neck supple.  Cardiovascular: Normal rate and intact distal pulses.   Pulmonary/Chest: Effort normal. No respiratory distress.  Musculoskeletal: He exhibits  tenderness.  Left shoulder; decreased shoulder flexion and abduction, without any gross deformity, NVI.  Right shoulder; diffusely tender without any overlying skin changes, NVI.  Tenderness along bilateral trapezius muscles.   Left knee; tenderness to the anterior, inferior knee on palpation, no swelling, patellar DTRs intact, NVI.    Neurological: He is alert and oriented to person, place, and time. Coordination normal.  Skin: Skin is warm.  Psychiatric: He has a normal mood and affect. His behavior is normal.  Nursing note and vitals reviewed.   ED Course  Procedures  DIAGNOSTIC STUDIES: Oxygen Saturation is 100% on RA, normal by my interpretation.    COORDINATION OF CARE: 10:16 AM Will prescribe ibuprofen and robaxin. Discussed with pt I would like him to follow up with a rheumatologist due to the chronicity of diffuse arthralgias. Pt acknowledges and agrees to plan.   MDM  Arthritis  Pt present with aching pain & joint stiffness worsened in the morning. On exam joints were tender with limited ROM, but no current medical concern for septic arthritis or joint as pt is afebrile & without warmth of the affected area.  Presentation c/w dx of OA. Pain managed in ED. Pt advised to follow up with orthopedics if symptoms persist for further evaluation if conservative therapies do not work. Recommended PT, exercise, and tylenol.  Return precaurtions discussed. Patient will be dc home & is agreeable with above plan.  Final diagnoses:  Polyarthralgia    BP 181/88 mmHg  Pulse 82  Temp(Src) 98.3 F (36.8 C) (Oral)  Resp 18  Ht 6' (1.829 m)  Wt 99.791 kg  BMI 29.83 kg/m2  SpO2 100%  The patient was noted to be hypertensive today in the emergency department. I have spoken with the patient regarding hypertension and the need for improved management. I instructed the patient to followup with the Primary care doctor within 4 days to improve the management of the patient's hypertension. I also  counseled the patient regarding the signs and symptoms which would require an emergent visit to an emergency department for hypertensive urgency and/or hypertensive emergency. The patient understood the need for improved hypertensive management.   I personally performed the services described in this documentation, which was scribed in my presence. The recorded information has been reviewed and is accurate.      [name], PA-C 09/11/15 1028  Harvel Quale, MD 09/12/15 2102

## 2015-10-15 ENCOUNTER — Telehealth: Payer: Self-pay | Admitting: *Deleted

## 2015-10-15 NOTE — Telephone Encounter (Signed)
Medical Assistant left message on patient's home and cell voicemail. Voicemail states to give a call back to [place] with [facility] at [phone].   Please inform patient of appointment with [facility] being 10/20/15 at 4:15pm. Patient should arrive at 3:45 for registration. Patient will be seeing Dr. Elmarie Shiley.

## 2015-11-25 ENCOUNTER — Emergency Department (HOSPITAL_COMMUNITY): Payer: Self-pay

## 2015-11-25 ENCOUNTER — Encounter (HOSPITAL_COMMUNITY): Payer: Self-pay

## 2015-11-25 ENCOUNTER — Observation Stay (HOSPITAL_COMMUNITY)
Admission: EM | Admit: 2015-11-25 | Discharge: 2015-11-26 | Disposition: A | Discharge status: Home / Self Care | Payer: Self-pay | Attending: Internal Medicine | Admitting: Internal Medicine

## 2015-11-25 DIAGNOSIS — R197 Diarrhea, unspecified: Secondary | ICD-10-CM | POA: Insufficient documentation

## 2015-11-25 DIAGNOSIS — Z91199 Patient's noncompliance with other medical treatment and regimen due to unspecified reason: Secondary | ICD-10-CM

## 2015-11-25 DIAGNOSIS — IMO0002 Reserved for concepts with insufficient information to code with codable children: Secondary | ICD-10-CM

## 2015-11-25 DIAGNOSIS — E1139 Type 2 diabetes mellitus with other diabetic ophthalmic complication: Secondary | ICD-10-CM | POA: Diagnosis present

## 2015-11-25 DIAGNOSIS — N189 Chronic kidney disease, unspecified: Secondary | ICD-10-CM | POA: Insufficient documentation

## 2015-11-25 DIAGNOSIS — R079 Chest pain, unspecified: Secondary | ICD-10-CM | POA: Diagnosis present

## 2015-11-25 DIAGNOSIS — I129 Hypertensive chronic kidney disease with stage 1 through stage 4 chronic kidney disease, or unspecified chronic kidney disease: Secondary | ICD-10-CM | POA: Insufficient documentation

## 2015-11-25 DIAGNOSIS — E119 Type 2 diabetes mellitus without complications: Secondary | ICD-10-CM | POA: Insufficient documentation

## 2015-11-25 DIAGNOSIS — I10 Essential (primary) hypertension: Secondary | ICD-10-CM | POA: Diagnosis present

## 2015-11-25 DIAGNOSIS — E118 Type 2 diabetes mellitus with unspecified complications: Secondary | ICD-10-CM

## 2015-11-25 DIAGNOSIS — R350 Frequency of micturition: Secondary | ICD-10-CM | POA: Insufficient documentation

## 2015-11-25 DIAGNOSIS — N179 Acute kidney failure, unspecified: Secondary | ICD-10-CM

## 2015-11-25 DIAGNOSIS — J069 Acute upper respiratory infection, unspecified: Principal | ICD-10-CM | POA: Insufficient documentation

## 2015-11-25 DIAGNOSIS — Z79899 Other long term (current) drug therapy: Secondary | ICD-10-CM | POA: Insufficient documentation

## 2015-11-25 DIAGNOSIS — Z9119 Patient's noncompliance with other medical treatment and regimen: Secondary | ICD-10-CM | POA: Insufficient documentation

## 2015-11-25 DIAGNOSIS — R1110 Vomiting, unspecified: Secondary | ICD-10-CM | POA: Insufficient documentation

## 2015-11-25 DIAGNOSIS — J029 Acute pharyngitis, unspecified: Secondary | ICD-10-CM | POA: Insufficient documentation

## 2015-11-25 DIAGNOSIS — Z87891 Personal history of nicotine dependence: Secondary | ICD-10-CM | POA: Insufficient documentation

## 2015-11-25 DIAGNOSIS — E1165 Type 2 diabetes mellitus with hyperglycemia: Secondary | ICD-10-CM

## 2015-11-25 DIAGNOSIS — E875 Hyperkalemia: Secondary | ICD-10-CM | POA: Diagnosis present

## 2015-11-25 DIAGNOSIS — R5383 Other fatigue: Secondary | ICD-10-CM | POA: Insufficient documentation

## 2015-11-25 HISTORY — DX: Patient's noncompliance with other medical treatment and regimen due to unspecified reason: Z91.199

## 2015-11-25 HISTORY — DX: Patient's noncompliance with other medical treatment and regimen: Z91.19

## 2015-11-25 HISTORY — DX: Chronic kidney disease, unspecified: N17.9

## 2015-11-25 HISTORY — DX: Chronic kidney disease, unspecified: N18.9

## 2015-11-25 LAB — BASIC METABOLIC PANEL
ANION GAP: 6 (ref 5–15)
Anion gap: 10 (ref 5–15)
BUN: 32 mg/dL — ABNORMAL HIGH (ref 6–20)
BUN: 31 mg/dL — AB (ref 6–20)
CALCIUM: 8.7 mg/dL — AB (ref 8.9–10.3)
CALCIUM: 9.1 mg/dL (ref 8.9–10.3)
CO2: 23 mmol/L (ref 22–32)
CO2: 21 mmol/L — ABNORMAL LOW (ref 22–32)
CREATININE: 1.9 mg/dL — AB (ref 0.61–1.24)
Chloride: 109 mmol/L (ref 101–111)
Chloride: 108 mmol/L (ref 101–111)
Creatinine, Ser: 1.84 mg/dL — ABNORMAL HIGH (ref 0.61–1.24)
GFR calc Af Amer: 48 mL/min — ABNORMAL LOW (ref >60)
GFR calc non Af Amer: 43 mL/min — ABNORMAL LOW (ref >60)
GFR, EST AFRICAN AMERICAN: 50 mL/min — AB (ref >60)
GFR, EST NON AFRICAN AMERICAN: 41 mL/min — AB (ref >60)
Glucose, Bld: 301 mg/dL — ABNORMAL HIGH (ref 65–99)
Glucose, Bld: 282 mg/dL — ABNORMAL HIGH (ref 65–99)
POTASSIUM: 6.9 mmol/L — AB (ref 3.5–5.1)
Potassium: 6.2 mmol/L (ref 3.5–5.1)
SODIUM: 139 mmol/L (ref 135–145)
Sodium: 138 mmol/L (ref 135–145)

## 2015-11-25 LAB — RAPID URINE DRUG SCREEN, HOSP PERFORMED
Amphetamines: NOT DETECTED
BENZODIAZEPINES: NOT DETECTED
Barbiturates: NOT DETECTED
Cocaine: NOT DETECTED
Opiates: NOT DETECTED
Tetrahydrocannabinol: NOT DETECTED

## 2015-11-25 LAB — COMPREHENSIVE METABOLIC PANEL
ALBUMIN: 2.9 g/dL — AB (ref 3.5–5.0)
ALT: 30 U/L (ref 17–63)
ANION GAP: 6 (ref 5–15)
AST: 26 U/L (ref 15–41)
Alkaline Phosphatase: 131 U/L — ABNORMAL HIGH (ref 38–126)
BUN: 33 mg/dL — AB (ref 6–20)
CALCIUM: 9.2 mg/dL (ref 8.9–10.3)
CHLORIDE: 109 mmol/L (ref 101–111)
CO2: 23 mmol/L (ref 22–32)
CREATININE: 1.95 mg/dL — AB (ref 0.61–1.24)
GFR calc Af Amer: 46 mL/min — ABNORMAL LOW (ref >60)
GFR calc non Af Amer: 40 mL/min — ABNORMAL LOW (ref >60)
GLUCOSE: 199 mg/dL — AB (ref 65–99)
POTASSIUM: 6.6 mmol/L — AB (ref 3.5–5.1)
SODIUM: 138 mmol/L (ref 135–145)
Total Bilirubin: 0.3 mg/dL (ref 0.3–1.2)
Total Protein: 6.9 g/dL (ref 6.5–8.1)

## 2015-11-25 LAB — CBC WITH DIFFERENTIAL/PLATELET
BASOS ABS: 0 10*3/uL (ref 0.0–0.1)
BASOS PCT: 0 %
EOS ABS: 0.1 10*3/uL (ref 0.0–0.7)
Eosinophils Relative: 1 %
HCT: 33 % — ABNORMAL LOW (ref 39.0–52.0)
Hemoglobin: 11.3 g/dL — ABNORMAL LOW (ref 13.0–17.0)
Lymphocytes Relative: 28 %
Lymphs Abs: 1.8 10*3/uL (ref 0.7–4.0)
MCH: 28.8 pg (ref 26.0–34.0)
MCHC: 34.2 g/dL (ref 30.0–36.0)
MCV: 84.2 fL (ref 78.0–100.0)
MONO ABS: 0.8 10*3/uL (ref 0.1–1.0)
MONOS PCT: 12 %
NEUTROS ABS: 3.8 10*3/uL (ref 1.7–7.7)
NEUTROS PCT: 59 %
Platelets: 319 10*3/uL (ref 150–400)
RBC: 3.92 MIL/uL — ABNORMAL LOW (ref 4.22–5.81)
RDW: 12.7 % (ref 11.5–15.5)
WBC: 6.4 10*3/uL (ref 4.0–10.5)

## 2015-11-25 LAB — CREATININE, SERUM
CREATININE: 1.92 mg/dL — AB (ref 0.61–1.24)
GFR calc Af Amer: 47 mL/min — ABNORMAL LOW (ref >60)
GFR calc non Af Amer: 41 mL/min — ABNORMAL LOW (ref >60)

## 2015-11-25 LAB — TSH: TSH: 0.018 u[IU]/mL — AB (ref 0.350–4.500)

## 2015-11-25 LAB — URINALYSIS, ROUTINE W REFLEX MICROSCOPIC
BILIRUBIN URINE: NEGATIVE
Glucose, UA: 100 mg/dL — AB
Ketones, ur: NEGATIVE mg/dL
Leukocytes, UA: NEGATIVE
Nitrite: NEGATIVE
PROTEIN: 100 mg/dL — AB
SPECIFIC GRAVITY, URINE: 1.014 (ref 1.005–1.030)
pH: 7 (ref 5.0–8.0)

## 2015-11-25 LAB — LIPID PANEL
CHOL/HDL RATIO: 3.3 ratio
Cholesterol: 156 mg/dL (ref 0–200)
HDL: 48 mg/dL (ref >40)
LDL CALC: 84 mg/dL (ref 0–99)
Triglycerides: 118 mg/dL (ref <150)
VLDL: 24 mg/dL (ref 0–40)

## 2015-11-25 LAB — CBG MONITORING, ED
GLUCOSE-CAPILLARY: 206 mg/dL — AB (ref 65–99)
Glucose-Capillary: 193 mg/dL — ABNORMAL HIGH (ref 65–99)

## 2015-11-25 LAB — URINE MICROSCOPIC-ADD ON

## 2015-11-25 LAB — I-STAT TROPONIN, ED
TROPONIN I, POC: 0.01 ng/mL (ref 0.00–0.08)
TROPONIN I, POC: 0.02 ng/mL (ref 0.00–0.08)

## 2015-11-25 LAB — CBC
HEMATOCRIT: 31.6 % — AB (ref 39.0–52.0)
HEMOGLOBIN: 10.9 g/dL — AB (ref 13.0–17.0)
MCH: 29.2 pg (ref 26.0–34.0)
MCHC: 34.5 g/dL (ref 30.0–36.0)
MCV: 84.7 fL (ref 78.0–100.0)
Platelets: 279 10*3/uL (ref 150–400)
RBC: 3.73 MIL/uL — ABNORMAL LOW (ref 4.22–5.81)
RDW: 12.5 % (ref 11.5–15.5)
WBC: 9.4 10*3/uL (ref 4.0–10.5)

## 2015-11-25 LAB — LIPASE, BLOOD: LIPASE: 39 U/L (ref 11–51)

## 2015-11-25 LAB — RAPID STREP SCREEN (MED CTR MEBANE ONLY): STREPTOCOCCUS, GROUP A SCREEN (DIRECT): NEGATIVE

## 2015-11-25 LAB — CK: CK TOTAL: 834 U/L — AB (ref 49–397)

## 2015-11-25 LAB — GLUCOSE, CAPILLARY: GLUCOSE-CAPILLARY: 190 mg/dL — AB (ref 65–99)

## 2015-11-25 MED ORDER — INSULIN ASPART 100 UNIT/ML ~~LOC~~ SOLN: 0.0000 [IU] | Freq: Every day | SUBCUTANEOUS | Status: DC

## 2015-11-25 MED ORDER — DEXTROSE 50 % IV SOLN
1.0000 | Freq: Once | INTRAVENOUS | Status: AC
  Administered 2015-11-25: 50 mL via INTRAVENOUS
  Filled 2015-11-25: qty 50

## 2015-11-25 MED ORDER — SODIUM POLYSTYRENE SULFONATE 15 GM/60ML PO SUSP
30.0000 g | Freq: Once | ORAL | Status: AC
  Administered 2015-11-25: 30 g via ORAL
  Filled 2015-11-25: qty 120

## 2015-11-25 MED ORDER — POLYETHYLENE GLYCOL 3350 17 G PO PACK: 17.0000 g | PACK | Freq: Every day | ORAL | Status: DC | PRN

## 2015-11-25 MED ORDER — MORPHINE SULFATE (PF) 2 MG/ML IV SOLN: 1.0000 mg | INTRAVENOUS | Status: DC | PRN

## 2015-11-25 MED ORDER — ALUM & MAG HYDROXIDE-SIMETH 200-200-20 MG/5ML PO SUSP: 30.0000 mL | Freq: Four times a day (QID) | ORAL | Status: DC | PRN

## 2015-11-25 MED ORDER — SODIUM CHLORIDE 0.9 % IV SOLN: INTRAVENOUS | Status: DC

## 2015-11-25 MED ORDER — INSULIN ASPART 100 UNIT/ML ~~LOC~~ SOLN
10.0000 [IU] | Freq: Once | SUBCUTANEOUS | Status: AC
  Administered 2015-11-25: 10 [IU] via SUBCUTANEOUS
  Filled 2015-11-25: qty 1

## 2015-11-25 MED ORDER — ONDANSETRON HCL 4 MG/2ML IJ SOLN: 4.0000 mg | Freq: Once | INTRAMUSCULAR | Status: DC

## 2015-11-25 MED ORDER — TRAZODONE HCL 50 MG PO TABS: 25.0000 mg | ORAL_TABLET | Freq: Every evening | ORAL | Status: DC | PRN

## 2015-11-25 MED ORDER — INSULIN ASPART 100 UNIT/ML ~~LOC~~ SOLN
10.0000 [IU] | Freq: Once | SUBCUTANEOUS | Status: AC
  Administered 2015-11-25: 10 [IU] via INTRAVENOUS
  Filled 2015-11-25: qty 1

## 2015-11-25 MED ORDER — ACETAMINOPHEN 650 MG RE SUPP: 650.0000 mg | Freq: Four times a day (QID) | RECTAL | Status: DC | PRN

## 2015-11-25 MED ORDER — SODIUM CHLORIDE 0.9 % IV SOLN
INTRAVENOUS | Status: DC
  Administered 2015-11-25 – 2015-11-26 (×3): via INTRAVENOUS

## 2015-11-25 MED ORDER — INSULIN ASPART 100 UNIT/ML ~~LOC~~ SOLN: 0.0000 [IU] | Freq: Three times a day (TID) | SUBCUTANEOUS | Status: DC

## 2015-11-25 MED ORDER — ACETAMINOPHEN 325 MG PO TABS: 650.0000 mg | ORAL_TABLET | Freq: Four times a day (QID) | ORAL | Status: DC | PRN

## 2015-11-25 MED ORDER — INSULIN ASPART 100 UNIT/ML ~~LOC~~ SOLN: 6.0000 [IU] | Freq: Once | SUBCUTANEOUS | Status: DC

## 2015-11-25 MED ORDER — SODIUM CHLORIDE 0.9 % IV BOLUS (SEPSIS)
1000.0000 mL | Freq: Once | INTRAVENOUS | Status: AC
  Administered 2015-11-25: 1000 mL via INTRAVENOUS

## 2015-11-25 MED ORDER — ONDANSETRON HCL 4 MG/2ML IJ SOLN: 4.0000 mg | Freq: Four times a day (QID) | INTRAMUSCULAR | Status: DC | PRN

## 2015-11-25 MED ORDER — ONDANSETRON HCL 4 MG PO TABS: 4.0000 mg | ORAL_TABLET | Freq: Four times a day (QID) | ORAL | Status: DC | PRN

## 2015-11-25 MED ORDER — INSULIN ASPART 100 UNIT/ML ~~LOC~~ SOLN
0.0000 [IU] | Freq: Three times a day (TID) | SUBCUTANEOUS | Status: DC
  Administered 2015-11-26: 3 [IU] via SUBCUTANEOUS

## 2015-11-25 MED ORDER — HEPARIN SODIUM (PORCINE) 5000 UNIT/ML IJ SOLN
5000.0000 [IU] | Freq: Three times a day (TID) | INTRAMUSCULAR | Status: DC
Start: 2015-11-25 — End: 2015-11-25

## 2015-11-25 NOTE — ED Notes (Signed)
cbg 193,. Pt. Stated, "I have not checked in a while.""

## 2015-11-25 NOTE — Progress Notes (Signed)
Patient arrived on unit via stretcher with ED NT. Patient alert and oriented x4. Patient oriented to unit, staff and room. Patient placed on telemetry, [place] notified. Skin assessment completed, old scabs/scars noted. Patient IV clean, dry and intact. Patient denies pain. Safety Fall Prevention Plan was given, discussed and signed by patient. Orders have been reviewed and implemented. Call light has been placed within reach. RN will continue to monitor the patient  Nena Polio BSN, RN  Phone Number: [phone]

## 2015-11-25 NOTE — H&P (Signed)
[facility] History and Physical  Edward [name] RWE:[id number] DOB: October 23, 1970 DOA: 11/25/2015  Referring physician:  PCP: Pcp Not In System   Chief Complaint: chest pain/cough/congestion  HPI: Edward [name] is a 45 y.o. male past medical history that includes diabetes type 2 noncompliant hypertension, as as to the emergency department with chief complaint of worsening shortness of breath, chest pain at rest. Initial evaluation reveals hyperkalemia, acute on chronic kidney disease hyperglycemia.  Information is obtained from the patient. Porta to 3 week history of nasal congestion, chest congestion with moist productive cough of thick greenish sputum. His taken Tylenol and cough syrup with no improvement. Yesterday developed a little left anterior chest "pressure". Worse with coughing. Associated symptoms include headache and subjective fever with intermittent chills as well as nausea with some dark emesis. Denies abdominal pain diarrhea constipation dysuria hematuria frequency or urgency. He denies headache dizziness syncope or near-syncope. He denies any recent travel or sick contacts.  In the emergency department he is afebrile hemodynamically stable with a slightly elevated blood pressure and not hypoxic. He does have a potassium of 6.2 is provided with 10 units of insulin intravenously with 1 amp of D50 and 2 L of normal saline.    Review of Systems:  10 point review of systems complete and all systems are negative except as indicated in the history of present illness.   Past Medical History  Diagnosis Date  . Diabetes mellitus   . Hypertension   . Non-compliance    Past Surgical History  Procedure Laterality Date  . Eye surgery     Social History:  reports that he quit smoking about 15 years ago. His smoking use included Cigarettes. He has a 2.25 pack-year smoking history. He does not have any smokeless tobacco history on file. He reports that he drinks  alcohol. He reports that he does not use illicit drugs.  No Known Allergies  Family History  Problem Relation Age of Onset  . Hypertension Mother   . Diabetes Mother      Prior to Admission medications   Medication Sig Start Date End Date Taking? Authorizing Provider  Blood Glucose Monitoring Suppl (TRUE METRIX METER) W/DEVICE KIT Check blood sugar before meals and at bedtime 05/29/15  Yes Lance Bosch, NP  glucose blood (TRUE METRIX BLOOD GLUCOSE TEST) test strip Use as instructed 05/29/15  Yes Lance Bosch, NP  ibuprofen (ADVIL,MOTRIN) 800 MG tablet Take 1 tablet (800 mg total) by mouth every 8 (eight) hours as needed for moderate pain. 09/11/15  Yes [name], PA-C  methocarbamol (ROBAXIN) 500 MG tablet Take 1 tablet (500 mg total) by mouth 2 (two) times daily. 09/11/15  Yes [name], PA-C  TRUEPLUS LANCETS 28G MISC Check blood sugar before meals and at bedtime 05/29/15  Yes Lance Bosch, NP  cyclobenzaprine (FLEXERIL) 10 MG tablet Take 1 tablet (10 mg total) by mouth 3 (three) times daily as needed for muscle spasms. Patient not taking: Reported on 05/29/2015 03/21/49   Delora Fuel, MD  furosemide (LASIX) 20 MG tablet Take 1 tablet (20 mg total) by mouth daily. Patient not taking: Reported on 11/25/2015 05/05/15   Lance Bosch, NP  glimepiride (AMARYL) 2 MG tablet Take 1 tablet (2 mg total) by mouth daily with breakfast. Patient not taking: Reported on 11/25/2015 05/05/15   Lance Bosch, NP  potassium chloride (K-DUR) 10 MEQ tablet Take 1 tablet (10 mEq total) by mouth daily. Must take with furosemid Patient not  taking: Reported on 11/25/2015 05/05/15   Lance Bosch, NP   Physical Exam: Filed Vitals:   11/25/15 1200 11/25/15 1215 11/25/15 1230 11/25/15 1315  BP: 161/85 154/78 150/79 166/96  Pulse: 81 91 90 98  Temp:      TempSrc:      Resp: 20 21 20 25   SpO2: 100% 100% 100% 100%    Wt Readings from Last 3 Encounters:  09/11/15 99.791 kg (220 lb)  05/29/15 96.888 kg (213 lb  9.6 oz)  05/05/15 99.791 kg (220 lb)    General:  Appears calm and comfortable and whiny  Eyes: PERRL, normal lids, irises & conjunctiva ENT: grossly normal hearing, lips & tongue His membranes of his mouth are pink slightly dry  Neck: no LAD, masses or thyromegaly Cardiovascular: Tachycardic but regular no murmur gallop or rub no lower extremity edema   Respiratory: CTA bilaterally, no w/r/r. Normal respiratory effort. Abdomen: soft, ntnd Skin: no rash or induration seen on limited exam Musculoskeletal: grossly normal tone BUE/BLE Psychiatric: grossly normal mood and affect, speech fluent and appropriate Neurologic: grossly non-focal. Speech clear facial symmetry           Labs on Admission:  Basic Metabolic Panel:  Recent Labs Lab 11/25/15 1022 11/25/15 1156  NA 138 138  K 6.6* 6.2*  CL 109 109  CO2 23 23  GLUCOSE 199* 301*  BUN 33* 32*  CREATININE 1.95* 1.84*  CALCIUM 9.2 8.7*   Liver Function Tests:  Recent Labs Lab 11/25/15 1022  AST 26  ALT 30  ALKPHOS 131*  BILITOT 0.3  PROT 6.9  ALBUMIN 2.9*    Recent Labs Lab 11/25/15 1022  LIPASE 39   No results for input(s): AMMONIA in the last 168 hours. CBC:  Recent Labs Lab 11/25/15 1022  WBC 6.4  NEUTROABS 3.8  HGB 11.3*  HCT 33.0*  MCV 84.2  PLT 319   Cardiac Enzymes: No results for input(s): CKTOTAL, CKMB, CKMBINDEX, TROPONINI in the last 168 hours.  BNP (last 3 results)  Recent Labs  01/30/15 2222  BNP 30.2    ProBNP (last 3 results) No results for input(s): PROBNP in the last 8760 hours.  CBG:  Recent Labs Lab 11/25/15 0848  GLUCAP 193*    Radiological Exams on Admission: Dg Chest 2 View  11/25/2015  CLINICAL DATA:  Bilateral chest pain with cough and chills EXAM: CHEST  2 VIEW COMPARISON:  01/31/2015 FINDINGS: Heart size is normal. Mediastinal shadows are normal. The lungs are clear. No bronchial thickening. No infiltrate, mass, effusion or collapse. Pulmonary vascularity is  normal. No bony abnormality. IMPRESSION: Normal chest Electronically Signed   By: Nelson Chimes M.D.   On: 11/25/2015 11:17    EKG: Independently reviewed Sinus rhythm  Borderline T abnormalities, lateral leads  Assessment/Plan Principal Problem:   Hyperkalemia Active Problems:   Diabetes mellitus type 2, uncontrolled, with complications (HCC)   HTN (hypertension)   Acute kidney injury superimposed on chronic kidney disease ([employer])   Chest pain at rest  #1. Hyperkalemia. Likely related to uncontrolled diabetes. He received IV insulin in the emergency department as well as D50 and IV fluids. Potassium trending down from 6.6-6.2. -Admit to telemetry -Continue vigorous IV fluids -Diabetes control -trend potassium level  2. Chest pain. Likely related to  Cough. Initial troponin negative. EKG without acute changes. Chest x-ray reveals clear lungs. No bronchial thickening infiltrate mass or effusion. Heart score 3. Noncompliant with antihypertensives.  --Monitor on telemetry -Cycle troponin -Serial EKG -  Lipid panel -Supportive therapy in the form of nitroglycerin excision supplementation as indicated  #3. Diabetes type 2. Uncontrolled. Noncompliant for "several months". History of same. Reviews on metformin in the past -Obtain hemoglobin A1c -Sliding scale insulin for optimal control -Diabetes coordinator consult  #4. Hypertension. Uncontrolled. Noncompliant with medications. Chart review indicates he's been on Lasix and metoprolol in the past. -We will use when necessary hydralazine while in the hospital -And to resume Lasix and metoprolol if kidney function stabilizes  5. Acute on chronic kidney disease. Stage 3. 9 months ago creatinine within the limits of normal. Review indicates baseline creatinine 1.5. In 1.84 on admission -vigorous IV fluids -hold nephrotoxins -Monitor urine output      Code Status: full (must indicate code status--if unknown or must be presumed, indicate  so) DVT Prophylaxis: Family Communication: none present Disposition Plan: home  Time spent: 75 minutes  [facility]

## 2015-11-25 NOTE — ED Notes (Signed)
Ordered meal tray for pt 

## 2015-11-25 NOTE — ED Provider Notes (Signed)
CSN: [id number]     Arrival date & time 11/25/15  0803 History   First MD Initiated Contact with Patient 11/25/15 0930     Chief Complaint  Patient presents with  . URI     (Consider location/radiation/quality/duration/timing/severity/associated sxs/prior Treatment) Patient is a 45 y.o. male presenting with URI and vomiting.  URI Presenting symptoms: congestion, cough, fatigue, rhinorrhea and sore throat   Presenting symptoms: no fever   Associated symptoms: no headaches   Emesis Severity:  Moderate Duration:  2 weeks Timing:  Constant Number of daily episodes:  2-3 times during day, 2 times during night Able to tolerate:  Liquids Progression:  Unchanged Chronicity:  New Recent urination:  Normal Associated symptoms: cough, diarrhea (1wk), sore throat and URI   Associated symptoms: no abdominal pain (with vomiting), no chills, no fever and no headaches   Associated symptoms comment:  Blood in emesis after vomited a few times. Bright red. Mixed with food.  Blood mixed with mucous when blowing nose   Not looking at what he coughs up No weight loss, no fevers   Past Medical History  Diagnosis Date  . Diabetes mellitus   . Hypertension   . Non-compliance    Past Surgical History  Procedure Laterality Date  . Eye surgery     Family History  Problem Relation Age of Onset  . Hypertension Mother   . Diabetes Mother    Social History  Substance Use Topics  . Smoking status: Former Smoker -- 0.75 packs/day for 3 years    Types: Cigarettes    Quit date: 05/28/2000  . Smokeless tobacco: None  . Alcohol Use: 0.0 oz/week    0 Standard drinks or equivalent per week     Comment: 1 beer every night    Review of Systems  Constitutional: Positive for fatigue. Negative for fever and chills.  HENT: Positive for congestion, rhinorrhea and sore throat.   Eyes: Negative for visual disturbance.  Respiratory: Positive for cough. Negative for shortness of breath.    Cardiovascular: Negative for chest pain.  Gastrointestinal: Positive for vomiting and diarrhea (1wk). Negative for abdominal pain (with vomiting), constipation and blood in stool (not black/bloody).  Genitourinary: Positive for frequency. Negative for difficulty urinating.  Musculoskeletal: Negative for back pain and neck stiffness.  Skin: Negative for rash.  Neurological: Negative for syncope and headaches.      Allergies  Review of patient's allergies indicates no known allergies.  Home Medications   Prior to Admission medications   Medication Sig Start Date End Date Taking? Authorizing Provider  Blood Glucose Monitoring Suppl (TRUE METRIX METER) W/DEVICE KIT Check blood sugar before meals and at bedtime 05/29/15  Yes Lance Bosch, NP  glucose blood (TRUE METRIX BLOOD GLUCOSE TEST) test strip Use as instructed 05/29/15  Yes Lance Bosch, NP  ibuprofen (ADVIL,MOTRIN) 800 MG tablet Take 1 tablet (800 mg total) by mouth every 8 (eight) hours as needed for moderate pain. 09/11/15  Yes [name], PA-C  methocarbamol (ROBAXIN) 500 MG tablet Take 1 tablet (500 mg total) by mouth 2 (two) times daily. 09/11/15  Yes [name], PA-C  TRUEPLUS LANCETS 28G MISC Check blood sugar before meals and at bedtime 05/29/15  Yes Lance Bosch, NP  cyclobenzaprine (FLEXERIL) 10 MG tablet Take 1 tablet (10 mg total) by mouth 3 (three) times daily as needed for muscle spasms. Patient not taking: Reported on 05/29/2015 0/26/37   Delora Fuel, MD  furosemide (LASIX) 20 MG tablet Take 1  tablet (20 mg total) by mouth daily. Patient not taking: Reported on 11/25/2015 05/05/15   Lance Bosch, NP  glimepiride (AMARYL) 2 MG tablet Take 1 tablet (2 mg total) by mouth daily with breakfast. Patient not taking: Reported on 11/25/2015 05/05/15   Lance Bosch, NP  potassium chloride (K-DUR) 10 MEQ tablet Take 1 tablet (10 mEq total) by mouth daily. Must take with furosemid Patient not taking: Reported on 11/25/2015 05/05/15    Lance Bosch, NP   BP 170/101 mmHg  Pulse 91  Temp(Src) 98.8 F (37.1 C) (Oral)  Resp 18  Ht 6' (1.829 m)  Wt 214 lb 6.4 oz (97.251 kg)  BMI 29.07 kg/m2  SpO2 100% Physical Exam  Constitutional: He is oriented to person, place, and time. He appears well-developed and well-nourished. No distress.  HENT:  Head: Normocephalic and atraumatic.  Eyes: Conjunctivae and EOM are normal.  Neck: Normal range of motion.  Cardiovascular: Normal rate, regular rhythm, normal heart sounds and intact distal pulses.  Exam reveals no gallop and no friction rub.   No murmur heard. Pulmonary/Chest: Effort normal and breath sounds normal. No respiratory distress. He has no wheezes. He has no rales.  Abdominal: Soft. He exhibits no distension. There is no tenderness. There is no guarding.  Musculoskeletal: He exhibits no edema.  Neurological: He is alert and oriented to person, place, and time.  Skin: Skin is warm and dry. He is not diaphoretic.  Nursing note and vitals reviewed.   ED Course  Procedures (including critical care time) Labs Review Labs Reviewed  CBC WITH DIFFERENTIAL/PLATELET - Abnormal; Notable for the following:    RBC 3.92 (*)    Hemoglobin 11.3 (*)    HCT 33.0 (*)    All other components within normal limits  COMPREHENSIVE METABOLIC PANEL - Abnormal; Notable for the following:    Potassium 6.6 (*)    Glucose, Bld 199 (*)    BUN 33 (*)    Creatinine, Ser 1.95 (*)    Albumin 2.9 (*)    Alkaline Phosphatase 131 (*)    GFR calc non Af Amer 40 (*)    GFR calc Af Amer 46 (*)    All other components within normal limits  URINALYSIS, ROUTINE W REFLEX MICROSCOPIC (NOT AT [facility]) - Abnormal; Notable for the following:    Glucose, UA 100 (*)    Hgb urine dipstick SMALL (*)    Protein, ur 100 (*)    All other components within normal limits  URINE MICROSCOPIC-ADD ON - Abnormal; Notable for the following:    Squamous Epithelial / LPF 0-5 (*)    Bacteria, UA FEW (*)    All other  components within normal limits  BASIC METABOLIC PANEL - Abnormal; Notable for the following:    Potassium 6.2 (*)    Glucose, Bld 301 (*)    BUN 32 (*)    Creatinine, Ser 1.84 (*)    Calcium 8.7 (*)    GFR calc non Af Amer 43 (*)    GFR calc Af Amer 50 (*)    All other components within normal limits  CBC - Abnormal; Notable for the following:    RBC 3.73 (*)    Hemoglobin 10.9 (*)    HCT 31.6 (*)    All other components within normal limits  CREATININE, SERUM - Abnormal; Notable for the following:    Creatinine, Ser 1.92 (*)    GFR calc non Af Amer 41 (*)  GFR calc Af Amer 47 (*)    All other components within normal limits  TSH - Abnormal; Notable for the following:    TSH 0.018 (*)    All other components within normal limits  BASIC METABOLIC PANEL - Abnormal; Notable for the following:    Potassium 6.9 (*)    CO2 21 (*)    Glucose, Bld 282 (*)    BUN 31 (*)    Creatinine, Ser 1.90 (*)    GFR calc non Af Amer 41 (*)    GFR calc Af Amer 48 (*)    All other components within normal limits  CK - Abnormal; Notable for the following:    Total CK 834 (*)    All other components within normal limits  GLUCOSE, CAPILLARY - Abnormal; Notable for the following:    Glucose-Capillary 190 (*)    All other components within normal limits  CBG MONITORING, ED - Abnormal; Notable for the following:    Glucose-Capillary 193 (*)    All other components within normal limits  CBG MONITORING, ED - Abnormal; Notable for the following:    Glucose-Capillary 206 (*)    All other components within normal limits  RAPID STREP SCREEN (NOT AT [facility])  CULTURE, GROUP A STREP ([place])  URINE CULTURE  LIPASE, BLOOD  LIPID PANEL  URINE RAPID DRUG SCREEN, HOSP PERFORMED  HEMOGLOBIN A3F  BASIC METABOLIC PANEL  BASIC METABOLIC PANEL  I-STAT TROPOININ, ED  I-STAT TROPOININ, ED  Randolm Idol, ED    Imaging Review Dg Chest 2 View  11/25/2015  CLINICAL DATA:  Bilateral chest pain with cough  and chills EXAM: CHEST  2 VIEW COMPARISON:  01/31/2015 FINDINGS: Heart size is normal. Mediastinal shadows are normal. The lungs are clear. No bronchial thickening. No infiltrate, mass, effusion or collapse. Pulmonary vascularity is normal. No bony abnormality. IMPRESSION: Normal chest Electronically Signed   By: Nelson Chimes M.D.   On: 11/25/2015 11:17   I have personally reviewed and evaluated these images and lab results as part of my medical decision-making.   EKG Interpretation   Date/Time:  Wednesday November 25 2015 11:31:48 EST Ventricular Rate:  73 PR Interval:  170 QRS Duration: 89 QT Interval:  379 QTC Calculation: 418 R Axis:     Text Interpretation:  Sinus rhythm Borderline T abnormalities, lateral  leads No significant change since last tracing Confirmed by [facility] MD,  Neco Kling ([id number]) on 11/25/2015 11:01:55 PM      MDM   Final diagnoses:  Acute kidney injury superimposed on chronic kidney disease (HCC)  Hyperkalemia  Chest pain at rest  URI (upper respiratory infection)   45yo male with history of DM, hypertension presents with concern for cough, congestion, nausea, and vomiting.  Abdominal exam benign, doubt acute intraabdominal process.  Urine shows hematuria, however no signs of infection.  No sign of DKA or HHS.  Hematemesis described is not consistent with severe acute GI bleed, and hgb at baseline.  Patient with hyperkalemia, mild AKI. No ECG changes of hyperkalemia.  Given insulin/dextrose, 2L NS. Will admit to medicine for further care.      Gareth Morgan, MD 11/25/15 2320

## 2015-11-25 NOTE — ED Notes (Signed)
Pt. Having cough, chills, sweats. Chest pain, body aches for 2 weeks. Pt. Also has a sore throat.  Pt. Coughs up intermittent blood tinged sputum .  Skin is warm and dry.   Nasal congestion. Pt. als  having n/v /d

## 2015-11-25 NOTE — ED Notes (Signed)
Per Dr. Marily Memos, only give pt 10 Units of novolog IV, D50 amp, 153ml/hr NS.

## 2015-11-26 DIAGNOSIS — E1165 Type 2 diabetes mellitus with hyperglycemia: Secondary | ICD-10-CM

## 2015-11-26 DIAGNOSIS — Z9119 Patient's noncompliance with other medical treatment and regimen: Secondary | ICD-10-CM

## 2015-11-26 DIAGNOSIS — I10 Essential (primary) hypertension: Secondary | ICD-10-CM | POA: Diagnosis present

## 2015-11-26 DIAGNOSIS — N179 Acute kidney failure, unspecified: Secondary | ICD-10-CM

## 2015-11-26 DIAGNOSIS — E875 Hyperkalemia: Secondary | ICD-10-CM

## 2015-11-26 DIAGNOSIS — E118 Type 2 diabetes mellitus with unspecified complications: Secondary | ICD-10-CM

## 2015-11-26 DIAGNOSIS — Z91199 Patient's noncompliance with other medical treatment and regimen due to unspecified reason: Secondary | ICD-10-CM

## 2015-11-26 DIAGNOSIS — N189 Chronic kidney disease, unspecified: Secondary | ICD-10-CM

## 2015-11-26 LAB — GLUCOSE, CAPILLARY
Glucose-Capillary: 164 mg/dL — ABNORMAL HIGH (ref 65–99)
Glucose-Capillary: 190 mg/dL — ABNORMAL HIGH (ref 65–99)

## 2015-11-26 LAB — BASIC METABOLIC PANEL
ANION GAP: 7 (ref 5–15)
ANION GAP: 8 (ref 5–15)
BUN: 27 mg/dL — ABNORMAL HIGH (ref 6–20)
BUN: 29 mg/dL — AB (ref 6–20)
CHLORIDE: 110 mmol/L (ref 101–111)
CHLORIDE: 112 mmol/L — AB (ref 101–111)
CO2: 21 mmol/L — AB (ref 22–32)
CO2: 23 mmol/L (ref 22–32)
Calcium: 8.4 mg/dL — ABNORMAL LOW (ref 8.9–10.3)
Calcium: 8.5 mg/dL — ABNORMAL LOW (ref 8.9–10.3)
Creatinine, Ser: 1.61 mg/dL — ABNORMAL HIGH (ref 0.61–1.24)
Creatinine, Ser: 1.68 mg/dL — ABNORMAL HIGH (ref 0.61–1.24)
GFR calc Af Amer: 56 mL/min — ABNORMAL LOW (ref >60)
GFR calc Af Amer: 59 mL/min — ABNORMAL LOW (ref >60)
GFR, EST NON AFRICAN AMERICAN: 48 mL/min — AB (ref >60)
GFR, EST NON AFRICAN AMERICAN: 51 mL/min — AB (ref >60)
GLUCOSE: 212 mg/dL — AB (ref 65–99)
GLUCOSE: 300 mg/dL — AB (ref 65–99)
POTASSIUM: 5.3 mmol/L — AB (ref 3.5–5.1)
POTASSIUM: 5.9 mmol/L — AB (ref 3.5–5.1)
Sodium: 140 mmol/L (ref 135–145)
Sodium: 141 mmol/L (ref 135–145)

## 2015-11-26 LAB — URINE CULTURE: Culture: NO GROWTH

## 2015-11-26 LAB — HEMOGLOBIN A1C
Hgb A1c MFr Bld: 8.2 % — ABNORMAL HIGH (ref 4.8–5.6)
Mean Plasma Glucose: 189 mg/dL

## 2015-11-26 MED ORDER — AMLODIPINE BESYLATE 5 MG PO TABS: 5.0000 mg | ORAL_TABLET | Freq: Every day | ORAL | Status: DC

## 2015-11-26 MED ORDER — GLIMEPIRIDE 4 MG PO TABS: 4.0000 mg | ORAL_TABLET | Freq: Every day | ORAL | Status: DC

## 2015-11-26 NOTE — Care Management Note (Addendum)
Case Management Note  Patient Details  Name: Edward [name] MRN: [id number] Date of Birth: 09-26-1971  Subjective/Objective:     CM following for progression and d/c planning.               Action/Plan: 11/26/15 Met with pt and wife, pt for d/c today, per pt and wife pt has previously been seen at [facility], per pt wife she will arrange his followup appointment. Pt may use [place] [facility].  [place] letter given to assist with medications and explained to pt and wife.   Expected Discharge Date:       11/26/2015           Expected Discharge Plan:  Home/Self Care  In-House Referral:  NA  Discharge planning Services  CM Consult [facility] letter given. Post Acute Care Choice:  NA Choice offered to:  NA  DME Arranged:  N/A DME Agency:  NA  HH Arranged:  NA HH Agency:  NA  Status of Service:  Completed, signed off  Medicare Important Message Given:    Date Medicare IM Given:    Medicare IM give by:    Date Additional Medicare IM Given:    Additional Medicare Important Message give by:     If discussed at [place] of Stay Meetings, dates discussed:    Additional Comments:  Adron Bene, RN 11/26/2015, 11:49 AM

## 2015-11-26 NOTE — Discharge Instructions (Signed)
Diabetes Mellitus and Food It is important for you to manage your blood sugar (glucose) level. Your blood glucose level can be greatly affected by what you eat. Eating healthier foods in the appropriate amounts throughout the day at about the same time each day will help you control your blood glucose level. It can also help slow or prevent worsening of your diabetes mellitus. Healthy eating may even help you improve the level of your blood pressure and reach or maintain a healthy weight.  General recommendations for healthful eating and cooking habits include:  Eating meals and snacks regularly. Avoid going long periods of time without eating to lose weight.  Eating a diet that consists mainly of plant-based foods, such as fruits, vegetables, nuts, legumes, and whole grains.  Using low-heat cooking methods, such as baking, instead of high-heat cooking methods, such as deep frying. Work with your dietitian to make sure you understand how to use the Nutrition Facts information on food labels. HOW CAN FOOD AFFECT ME? Carbohydrates Carbohydrates affect your blood glucose level more than any other type of food. Your dietitian will help you determine how many carbohydrates to eat at each meal and teach you how to count carbohydrates. Counting carbohydrates is important to keep your blood glucose at a healthy level, especially if you are using insulin or taking certain medicines for diabetes mellitus. Alcohol Alcohol can cause sudden decreases in blood glucose (hypoglycemia), especially if you use insulin or take certain medicines for diabetes mellitus. Hypoglycemia can be a life-threatening condition. Symptoms of hypoglycemia (sleepiness, dizziness, and disorientation) are similar to symptoms of having too much alcohol.  If your health care provider has given you approval to drink alcohol, do so in moderation and use the following guidelines:  Women should not have more than one drink per day, and men  should not have more than two drinks per day. One drink is equal to:  12 oz of beer.  5 oz of wine.  1 oz of hard liquor.  Do not drink on an empty stomach.  Keep yourself hydrated. Have water, diet soda, or unsweetened iced tea.  Regular soda, juice, and other mixers might contain a lot of carbohydrates and should be counted. WHAT FOODS ARE NOT RECOMMENDED? As you make food choices, it is important to remember that all foods are not the same. Some foods have fewer nutrients per serving than other foods, even though they might have the same number of calories or carbohydrates. It is difficult to get your body what it needs when you eat foods with fewer nutrients. Examples of foods that you should avoid that are high in calories and carbohydrates but low in nutrients include:  Trans fats (most processed foods list trans fats on the Nutrition Facts label).  Regular soda.  Juice.  Candy.  Sweets, such as cake, pie, doughnuts, and cookies.  Fried foods. WHAT FOODS CAN I EAT? Eat nutrient-rich foods, which will nourish your body and keep you healthy. The food you should eat also will depend on several factors, including:  The calories you need.  The medicines you take.  Your weight.  Your blood glucose level.  Your blood pressure level.  Your cholesterol level. You should eat a variety of foods, including:  Protein.  Lean cuts of meat.  Proteins low in saturated fats, such as fish, egg whites, and beans. Avoid processed meats.  Fruits and vegetables.  Fruits and vegetables that may help control blood glucose levels, such as apples, mangoes, and   yams.  Dairy products.  Choose fat-free or low-fat dairy products, such as milk, yogurt, and cheese.  Grains, bread, pasta, and rice.  Choose whole grain products, such as multigrain bread, whole oats, and brown rice. These foods may help control blood pressure.  Fats.  Foods containing healthful fats, such as nuts,  avocado, olive oil, canola oil, and fish. DOES EVERYONE WITH DIABETES MELLITUS HAVE THE SAME MEAL PLAN? Because every person with diabetes mellitus is different, there is not one meal plan that works for everyone. It is very important that you meet with a dietitian who will help you create a meal plan that is just right for you.   This information is not intended to replace advice given to you by your health care provider. Make sure you discuss any questions you have with your health care provider.   Document Released: 06/16/2005 Document Revised: 10/10/2014 Document Reviewed: 08/16/2013 Elsevier Interactive Patient Education 2016 Elsevier Inc[employer] DASH Eating Plan DASH stands for "Dietary Approaches to Stop Hypertension." The DASH eating plan is a healthy eating plan that has been shown to reduce high blood pressure (hypertension). Additional health benefits may include reducing the risk of type 2 diabetes mellitus, heart disease, and stroke. The DASH eating plan may also help with weight loss. WHAT DO I NEED TO KNOW ABOUT THE DASH EATING PLAN? For the DASH eating plan, you will follow these general guidelines:  Choose foods with a percent daily value for sodium of less than 5% (as listed on the food label).  Use salt-free seasonings or herbs instead of table salt or sea salt.  Check with your health care provider or pharmacist before using salt substitutes.  Eat lower-sodium products, often labeled as "lower sodium" or "no salt added."  Eat fresh foods.  Eat more vegetables, fruits, and low-fat dairy products.  Choose whole grains. Look for the word "whole" as the first word in the ingredient list.  Choose fish and skinless chicken or turkey more often than red meat. Limit fish, poultry, and meat to 6 oz (170 g) each day.  Limit sweets, desserts, sugars, and sugary drinks.  Choose heart-healthy fats.  Limit cheese to 1 oz (28 g) per day.  Eat more home-cooked food and less  restaurant, buffet, and fast food.  Limit fried foods.  Cook foods using methods other than frying.  Limit canned vegetables. If you do use them, rinse them well to decrease the sodium.  When eating at a restaurant, ask that your food be prepared with less salt, or no salt if possible. WHAT FOODS CAN I EAT? Seek help from a dietitian for individual calorie needs. Grains Whole grain or whole wheat bread. Brown rice. Whole grain or whole wheat pasta. Quinoa, bulgur, and whole grain cereals. Low-sodium cereals. Corn or whole wheat flour tortillas. Whole grain cornbread. Whole grain crackers. Low-sodium crackers. Vegetables Fresh or frozen vegetables (raw, steamed, roasted, or grilled). Low-sodium or reduced-sodium tomato and vegetable juices. Low-sodium or reduced-sodium tomato sauce and paste. Low-sodium or reduced-sodium canned vegetables.  Fruits All fresh, canned (in natural juice), or frozen fruits. Meat and Other Protein Products Ground beef (85% or leaner), grass-fed beef, or beef trimmed of fat. Skinless chicken or turkey. Ground chicken or turkey. Pork trimmed of fat. All fish and seafood. Eggs. Dried beans, peas, or lentils. Unsalted nuts and seeds. Unsalted canned beans. Dairy Low-fat dairy products, such as skim or 1% milk, 2% or reduced-fat cheeses, low-fat ricotta or cottage cheese, or plain low-fat yogurt. Low-sodium   or reduced-sodium cheeses. Fats and Oils Tub margarines without trans fats. Light or reduced-fat mayonnaise and salad dressings (reduced sodium). Avocado. Safflower, olive, or canola oils. Natural peanut or almond butter. Other Unsalted popcorn and pretzels. The items listed above may not be a complete list of recommended foods or beverages. Contact your dietitian for more options. WHAT FOODS ARE NOT RECOMMENDED? Grains White bread. White pasta. White rice. Refined cornbread. Bagels and croissants. Crackers that contain trans fat. Vegetables Creamed or fried  vegetables. Vegetables in a cheese sauce. Regular canned vegetables. Regular canned tomato sauce and paste. Regular tomato and vegetable juices. Fruits Dried fruits. Canned fruit in light or heavy syrup. Fruit juice. Meat and Other Protein Products Fatty cuts of meat. Ribs, chicken wings, bacon, sausage, bologna, salami, chitterlings, fatback, hot dogs, bratwurst, and packaged luncheon meats. Salted nuts and seeds. Canned beans with salt. Dairy Whole or 2% milk, cream, half-and-half, and cream cheese. Whole-fat or sweetened yogurt. Full-fat cheeses or blue cheese. Nondairy creamers and whipped toppings. Processed cheese, cheese spreads, or cheese curds. Condiments Onion and garlic salt, seasoned salt, table salt, and sea salt. Canned and packaged gravies. Worcestershire sauce. Tartar sauce. Barbecue sauce. Teriyaki sauce. Soy sauce, including reduced sodium. Steak sauce. Fish sauce. Oyster sauce. Cocktail sauce. Horseradish. Ketchup and mustard. Meat flavorings and tenderizers. Bouillon cubes. Hot sauce. Tabasco sauce. Marinades. Taco seasonings. Relishes. Fats and Oils Butter, stick margarine, lard, shortening, ghee, and bacon fat. Coconut, palm kernel, or palm oils. Regular salad dressings. Other Pickles and olives. Salted popcorn and pretzels. The items listed above may not be a complete list of foods and beverages to avoid. Contact your dietitian for more information. WHERE CAN I FIND MORE INFORMATION? National Heart, Lung, and Blood Institute: www.nhlbi.nih.gov/health/health-topics/topics/dash/   This information is not intended to replace advice given to you by your health care provider. Make sure you discuss any questions you have with your health care provider.   Document Released: 09/08/2011 Document Revised: 10/10/2014 Document Reviewed: 07/24/2013 Elsevier Interactive Patient Education 2016 Elsevier Inc.  

## 2015-11-26 NOTE — Discharge Summary (Addendum)
Physician Discharge Summary  Edward [name] YKZ:[id number] DOB: 01-17-1971 DOA: 11/25/2015  PCP: Pcp Not In System  Admit date: 11/25/2015 Discharge date: 11/26/2015  Time spent: 25  minutes  Recommendations for Outpatient Follow-up:  1. Discharge home. Patient instructed to call other community wellness Center and schedule an appointment as early as possible.   Discharge Diagnoses:  Principal Problem:   Hyperkalemia   Active Problems:   Diabetes mellitus type 2, uncontrolled, with complications ([place])   Acute kidney injury superimposed on chronic kidney disease ([place])   Chest pain at rest   Non compliance with medical treatment   Uncontrolled hypertension   Discharge Condition: Fair  Diet recommendation: Diabetic/low sodium  Filed Weights   11/25/15 2004  Weight: 97.251 kg (214 lb 6.4 oz)    History of present illness:  Please refer to admission H&P for details, in brief, 45 year old male with history of diabetes mellitus type 2, hypertension who is noncompliant with medication and outpatient follow-up (stating that he did not have insurance and could not afford follow-up as well as medications) presented to the ED with worsening shortness of breath with chest pain at rest. Vitals on presentation showed patient to be hypertensive. Patient reported that on the day prior to admission he had left anterior chest pressure which was worse with coughing, associated headache and subjective fever with chills. He was nauseous with an episode of vomiting. Denied any abdominal pain, bowel urinary symptoms. Denied any dizziness or syncope. Denies recent travel or sick contacts. In the ED vitals were stable except for elevated blood pressure. He was hyperkalemic with potassium of 6.2 and worsened renal function from his baseline with creatinine of 1.92. Blood glucose was in 300. patient was given 10 units of regular insulin with 1 amp of D50 and 2 L IV normal saline. EKG showed T-wave  flattening in lateral leads. Initial troponin was negative. Admitted to hospitalist service on observation.   Hospital Course:  Acute hyperkalemia Possibly associated with uncontrolled diabetes mellitus and ? rhabdomyolysis. Improved with insulin received in the ED and an aggressive IV hydration. Potassium has improved to 5.2 this morning. Stable in telemetry. Follow-up EKG shows mild T-wave flattening without any significant change. She does not have further chest pain symptoms.   Chest pain Likely associated with recent URI symptoms and worsened with cough. Also had elevated CPK that could be contributing to musculoskeletal symptoms. 2 sets of troponin was negative. EKG without any acute changes. Chest x-ray was unremarkable. Patient has been noncompliant with antihypertensives and antidiabetic medications. He does not have further symptoms this morning. His heart score was 3. He needs to continue follow-up at the wellness Center.  Uncontrolled type 2 diabetes mellitus Patient has been noncompliant with medications for past several months. Was last seen almost 6 months back at the wellness Center. Patient has a glucometer at home but does not monitor his blood glucose or take his medications. CBG in 200s A1c of 8.2. He was on Amaryl and I have written a prescription of Amaryl 4 mg daily. Have instructed him to keep a log of his blood glucose monitoring ensure twice PCP during patient visit. Patient's wife reports that because of lack of insurance the could not afford outpatient follow-up or take medications. He now has insurance and will call the wellness Center to schedule an appointment as early as possible.  Acute on chronic kidney disease stage III Baseline creatinine on 1.5. Presented with creatinine 1.92 which Improved with aggressive hydration to 1.61 this  morning. Possibly worsened by uncontrolled diabetes. Instructed clearly on dietary adherence, close blood glucose monitoring and  medication compliance. Patient instructed not to use NSAIDs.  Uncontrolled hypertension Due to medication noncompliance and dietary non-adherence. Would prescribe him with amlodipine 5 mg daily.   Anemia Needs outpatient evaluation.  Elevated CPK.  In 800s. Denies any trauma.? Acute viral illness. Follow-up as outpatient.  Patient is clinically stable to be discharged home. I have written him a prescription for amlodipine 5 mg daily and Amaryl 4 mg daily. Have provided him verbal and written instructions on monitoring his diet, monitoring his blood glucose at home and medication adherence. Wife plans to schedule an appointment at the [facility] as early as possible.  Procedures:  None  Consultations:  None    Family communication: Wife at bedside  Disposition: Home  Discharge Exam: Filed Vitals:   11/26/15 0527 11/26/15 0740  BP: 161/91 168/100  Pulse: 78 79  Temp: 98.6 F (37 C) 98.6 F (37 C)  Resp: 19 18    General: Middle aged male  not in distress HEENT: No pallor, moist oral mucosa Chest: Clear bilaterally CVS: Normal S1-S2, no murmurs rub or gallop GI: Soft, nondistended, nontender, bowel sounds present Musculoskeletal: Warm, no edema CNS: Alert and oriented   Discharge Instructions    Current Discharge Medication List    START taking these medications   Details  amLODipine (NORVASC) 5 MG tablet Take 1 tablet (5 mg total) by mouth daily. Qty: 30 tablet, Refills: 0      CONTINUE these medications which have CHANGED   Details  glimepiride (AMARYL) 4 MG tablet Take 1 tablet (4 mg total) by mouth daily with breakfast. Qty: 30 tablet, Refills: 0   Associated Diagnoses: Diabetes mellitus type 2, uncontrolled, with complications ([place])      CONTINUE these medications which have NOT CHANGED   Details  Blood Glucose Monitoring Suppl (TRUE METRIX METER) W/DEVICE KIT Check blood sugar before meals and at bedtime Qty: 1 kit, Refills: 0     glucose blood (TRUE METRIX BLOOD GLUCOSE TEST) test strip Use as instructed Qty: 100 each, Refills: 12    TRUEPLUS LANCETS 28G MISC Check blood sugar before meals and at bedtime Qty: 100 each, Refills: 11      STOP taking these medications     ibuprofen (ADVIL,MOTRIN) 800 MG tablet      methocarbamol (ROBAXIN) 500 MG tablet      cyclobenzaprine (FLEXERIL) 10 MG tablet      furosemide (LASIX) 20 MG tablet      potassium chloride (K-DUR) 10 MEQ tablet        No Known Allergies Follow-up Information    Follow up with [facility]. Schedule an appointment as soon as possible for a visit in 1 week.   Contact information:   [address] [place] [address] [phone]       The results of significant diagnostics from this hospitalization (including imaging, microbiology, ancillary and laboratory) are listed below for reference.    Significant Diagnostic Studies: Dg Chest 2 View  11/25/2015  CLINICAL DATA:  Bilateral chest pain with cough and chills EXAM: CHEST  2 VIEW COMPARISON:  01/31/2015 FINDINGS: Heart size is normal. Mediastinal shadows are normal. The lungs are clear. No bronchial thickening. No infiltrate, mass, effusion or collapse. Pulmonary vascularity is normal. No bony abnormality. IMPRESSION: Normal chest Electronically Signed   By: Nelson Chimes M.D.   On: 11/25/2015 11:17  Microbiology: Recent Results (from the past 240 hour(s))  Rapid strep screen (not at [facility])     Status: None   Collection Time: 11/25/15  8:50 AM  Result Value Ref Range Status   Streptococcus, Group A Screen (Direct) NEGATIVE NEGATIVE Final    Comment: (NOTE) A Rapid Antigen test may result negative if the antigen level in the sample is below the detection level of this test. The FDA has not cleared this test as a stand-alone test therefore the rapid antigen negative result has reflexed to a Group A Strep culture.       Labs: Basic Metabolic Panel:  Recent Labs Lab 11/25/15 1022 11/25/15 1156 11/25/15 1532 11/26/15 0021 11/26/15 0506  NA 138 138 139 140 141  K 6.6* 6.2* 6.9* 5.9* 5.3*  CL 109 109 108 110 112*  CO2 23 23 21* 23 21*  GLUCOSE 199* 301* 282* 300* 212*  BUN 33* 32* 31* 29* 27*  CREATININE 1.95* 1.84* 1.90*  1.92* 1.68* 1.61*  CALCIUM 9.2 8.7* 9.1 8.4* 8.5*   Liver Function Tests:  Recent Labs Lab 11/25/15 1022  AST 26  ALT 30  ALKPHOS 131*  BILITOT 0.3  PROT 6.9  ALBUMIN 2.9*    Recent Labs Lab 11/25/15 1022  LIPASE 39   No results for input(s): AMMONIA in the last 168 hours. CBC:  Recent Labs Lab 11/25/15 1022 11/25/15 1532  WBC 6.4 9.4  NEUTROABS 3.8  --   HGB 11.3* 10.9*  HCT 33.0* 31.6*  MCV 84.2 84.7  PLT 319 279   Cardiac Enzymes:  Recent Labs Lab 11/25/15 1532  CKTOTAL 834*   BNP: BNP (last 3 results)  Recent Labs  01/30/15 2222  BNP 30.2    ProBNP (last 3 results) No results for input(s): PROBNP in the last 8760 hours.  CBG:  Recent Labs Lab 11/25/15 0848 11/25/15 1746 11/25/15 2054 11/26/15 0735  GLUCAP 193* 206* 190* 164*       Signed:  Louellen Molder MD.  [facility] 11/26/2015, 11:12 AM

## 2015-11-28 LAB — CULTURE, GROUP A STREP (THRC)

## 2015-12-08 ENCOUNTER — Encounter: Payer: Self-pay | Admitting: Internal Medicine

## 2015-12-08 ENCOUNTER — Ambulatory Visit: Payer: BLUE CROSS/BLUE SHIELD | Attending: Internal Medicine | Admitting: Internal Medicine

## 2015-12-08 VITALS — BP 138/88 | HR 86 | Temp 98.3°F | Resp 16 | Ht 72.0 in | Wt 211.0 lb

## 2015-12-08 DIAGNOSIS — R252 Cramp and spasm: Secondary | ICD-10-CM | POA: Diagnosis not present

## 2015-12-08 DIAGNOSIS — E118 Type 2 diabetes mellitus with unspecified complications: Secondary | ICD-10-CM | POA: Diagnosis not present

## 2015-12-08 DIAGNOSIS — E1165 Type 2 diabetes mellitus with hyperglycemia: Secondary | ICD-10-CM | POA: Insufficient documentation

## 2015-12-08 DIAGNOSIS — E875 Hyperkalemia: Secondary | ICD-10-CM

## 2015-12-08 DIAGNOSIS — IMO0002 Reserved for concepts with insufficient information to code with codable children: Secondary | ICD-10-CM

## 2015-12-08 DIAGNOSIS — N189 Chronic kidney disease, unspecified: Secondary | ICD-10-CM | POA: Insufficient documentation

## 2015-12-08 DIAGNOSIS — I10 Essential (primary) hypertension: Secondary | ICD-10-CM

## 2015-12-08 DIAGNOSIS — I129 Hypertensive chronic kidney disease with stage 1 through stage 4 chronic kidney disease, or unspecified chronic kidney disease: Secondary | ICD-10-CM | POA: Insufficient documentation

## 2015-12-08 MED ORDER — AMLODIPINE BESYLATE 5 MG PO TABS: 5.0000 mg | ORAL_TABLET | Freq: Every day | ORAL | Status: DC

## 2015-12-08 MED ORDER — CLONIDINE HCL 0.1 MG PO TABS
0.1000 mg | ORAL_TABLET | Freq: Once | ORAL | Status: AC
  Administered 2015-12-08: 0.1 mg via ORAL

## 2015-12-08 MED ORDER — GLIMEPIRIDE 4 MG PO TABS: 4.0000 mg | ORAL_TABLET | Freq: Every day | ORAL | Status: DC

## 2015-12-08 NOTE — Progress Notes (Signed)
Patient ID: Edward [name], male   DOB: [birth year], 45 y.o.   MRN: [id number] SUBJECTIVE: 45 y.o. male for follow up of diabetes and hypertension. Patient reports that he was admitted into the hospital on 2/22-2/23 for hyperkalemia. Review of EMR reveals that patient had a potassium level of 6.2, kidney injury, and chest pain. Patient was also determined to be non-compliant with his anti-hypertensive medication and diabetes medication. Patient reports that since hospital discharge he has not begun taking Amlodipine because he wanted to make sure his PCP approved the medication. Patient has since been taking Glimepiride but states that he has had two episode where he felt shaky like his sugars dropped. He does not check his sugars but states that he ate and felt much better. He .reports polyuria, polydipsia, muscle cramps, decreased libido  Current Outpatient Prescriptions  Medication Sig Dispense Refill  . amLODipine (NORVASC) 5 MG tablet Take 1 tablet (5 mg total) by mouth daily. 30 tablet 0  . Blood Glucose Monitoring Suppl (TRUE METRIX METER) W/DEVICE KIT Check blood sugar before meals and at bedtime 1 kit 0  . glimepiride (AMARYL) 4 MG tablet Take 1 tablet (4 mg total) by mouth daily with breakfast. 30 tablet 0  . glucose blood (TRUE METRIX BLOOD GLUCOSE TEST) test strip Use as instructed 100 each 12  . TRUEPLUS LANCETS 28G MISC Check blood sugar before meals and at bedtime 100 each 11  . [DISCONTINUED] gabapentin (NEURONTIN) 100 MG capsule Take 1 capsule (100 mg total) by mouth 3 (three) times daily. 60 capsule 0   No current facility-administered medications for this visit.  Review of Systems  Constitutional: Positive for malaise/fatigue.  Eyes: Negative for blurred vision.  Genitourinary: Positive for frequency.  Neurological: Positive for dizziness, weakness and headaches.  Endo/Heme/Allergies: Positive for polydipsia.  All other systems reviewed and are  negative.   OBJECTIVE: Appearance: alert, well appearing, and in no distress, oriented to person, place, and time and normal appearing weight. BP 163/103 mmHg  Pulse 84  Temp(Src) 98.3 F (36.8 C) (Oral)  Resp 16  Ht 6' (1.829 m)  Wt 211 lb (95.709 kg)  BMI 28.61 kg/m2  SpO2 98%  Exam: heart sounds normal rate, regular rhythm, normal S1, S2, no murmurs, rubs, clicks or gallops, no JVD, chest clear, no hepatosplenomegaly, no carotid bruits, no edema  ASSESSMENT: Edward [name] was seen today for follow-up.  Diagnoses and all orders for this visit:  Uncontrolled hypertension -     cloNIDine (CATAPRES) tablet 0.1 mg; Take 1 tablet (0.1 mg total) by mouth once. -     amLODipine (NORVASC) 5 MG tablet; Take 1 tablet (5 mg total) by mouth daily. BP uncontrolled due to medication non-compliance. Stressed that he starts on Amlodipine and RTC in one week for a BP recheck. DASH diet advised. Stressed long term complications including worsening kidney disease that ultimately may lead to dialysis.   Uncontrolled type 2 diabetes mellitus with complication, without long-term current use of insulin ([facility]) -     glimepiride (AMARYL) 4 MG tablet; Take 1 tablet (4 mg total) by mouth daily with breakfast.  Continue Glimepiride, stressed that he check his sugars and eat at regular intervals to prevent hypoglycemia. Hypoglycemia plan discussed. I would eventually like to switch patient off Sulfonylurea and place him on a SGLT-2 or DPP-4. Will check him in 3 months and consider switching at that time.    Hyperkalemia -     COMPLETE METABOLIC PANEL WITH GFR I will recheck levels  today to make sure he is stable. If elevated I will send appropriate medication.  Muscle cramps -     Magnesium  CKD Stressed strict BP and glycemic control. See above   Return in about 1 week (around 12/15/2015) for [place]--BP check and 8 weeks PCP .  Lance Bosch, NP 12/08/2015 8:57 PM

## 2015-12-08 NOTE — Progress Notes (Signed)
Patient is here for f/up high potassium, no further concern there.  Patient c/o bodyaches and dizzy spells with cramping in hands and legs rated pain 10+. Patient states pain is worse with movement rating pain at 9/10, no pain when sitting still.  Patient having pain in L index finger rated at 10/10, described as nagging pain.  Patient having HA's also.  Patient had the flu.

## 2015-12-09 LAB — COMPLETE METABOLIC PANEL WITH GFR
ALT: 33 U/L (ref 9–46)
AST: 25 U/L (ref 10–40)
Albumin: 3.6 g/dL (ref 3.6–5.1)
Alkaline Phosphatase: 119 U/L — ABNORMAL HIGH (ref 40–115)
BUN: 31 mg/dL — ABNORMAL HIGH (ref 7–25)
CO2: 21 mmol/L (ref 20–31)
Calcium: 8.9 mg/dL (ref 8.6–10.3)
Chloride: 110 mmol/L (ref 98–110)
Creat: 1.51 mg/dL — ABNORMAL HIGH (ref 0.60–1.35)
GFR, EST NON AFRICAN AMERICAN: 55 mL/min — AB (ref >=60)
GFR, Est African American: 64 mL/min (ref >=60)
GLUCOSE: 86 mg/dL (ref 65–99)
POTASSIUM: 4.6 mmol/L (ref 3.5–5.3)
SODIUM: 142 mmol/L (ref 135–146)
TOTAL PROTEIN: 6.5 g/dL (ref 6.1–8.1)
Total Bilirubin: 0.3 mg/dL (ref 0.2–1.2)

## 2015-12-09 LAB — MAGNESIUM: Magnesium: 2 mg/dL (ref 1.5–2.5)

## 2015-12-16 ENCOUNTER — Ambulatory Visit: Payer: BLUE CROSS/BLUE SHIELD | Attending: Internal Medicine | Admitting: Pharmacist

## 2015-12-16 VITALS — BP 166/102 | HR 87

## 2015-12-16 DIAGNOSIS — Z79899 Other long term (current) drug therapy: Secondary | ICD-10-CM | POA: Diagnosis not present

## 2015-12-16 DIAGNOSIS — I10 Essential (primary) hypertension: Secondary | ICD-10-CM | POA: Diagnosis present

## 2015-12-16 MED ORDER — AMLODIPINE BESYLATE 10 MG PO TABS: 10.0000 mg | ORAL_TABLET | Freq: Every day | ORAL | Status: DC

## 2015-12-16 NOTE — Progress Notes (Signed)
S:    Patient arrives in good spirits with his son.    Presents to the clinic for hypertension evaluation. Patient was referred on 12/08/15 by Chari Manning.  Patient was last seen by Primary Care Provider on 12/08/15.   Patient reports adherence with medications.  Current BP Medications include:  Amlodipine 5 mg daily   O:   Last 3 Office BP readings: BP Readings from Last 3 Encounters:  12/16/15 166/102  12/08/15 138/88  11/26/15 168/100    BMET    Component Value Date/Time   NA 142 12/08/2015 1723   K 4.6 12/08/2015 1723   CL 110 12/08/2015 1723   CO2 21 12/08/2015 1723   GLUCOSE 86 12/08/2015 1723   BUN 31* 12/08/2015 1723   CREATININE 1.51* 12/08/2015 1723   CREATININE 1.61* 11/26/2015 0506   CALCIUM 8.9 12/08/2015 1723   GFRNONAA 55* 12/08/2015 1723   GFRNONAA 51* 11/26/2015 0506   GFRAA 64 12/08/2015 1723   GFRAA 59* 11/26/2015 0506    A/P: Hypertension longstanding currently uncontrolled on current medications.  Increased dose of amlodipine to 10 mg daily to max out that medication. If still uncontrolled, or even if controlled, would consider addition of ACEi/ARB at next visit for renal protection in diabetes.   Results reviewed and written information provided.   Total time in face-to-face counseling 20 minutes.   F/U Clinic Visit with me in 2 weeks.  Patient seen with Jamie Brookes, PharmD Candidate

## 2015-12-16 NOTE — Patient Instructions (Signed)
Thanks for coming to see [place]  Increase amlodipine to 10 mg daily  Come back in 2 weeks for blood pressure check

## 2015-12-29 ENCOUNTER — Telehealth: Payer: Self-pay

## 2015-12-29 NOTE — Telephone Encounter (Signed)
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

## 2015-12-29 NOTE — Telephone Encounter (Signed)
Spoke with patient and he is aware of his lab results 

## 2015-12-30 ENCOUNTER — Ambulatory Visit: Payer: BLUE CROSS/BLUE SHIELD | Admitting: Pharmacist

## 2015-12-31 ENCOUNTER — Encounter: Payer: Self-pay | Admitting: Pharmacist

## 2015-12-31 ENCOUNTER — Ambulatory Visit: Payer: BLUE CROSS/BLUE SHIELD | Attending: Internal Medicine | Admitting: Pharmacist

## 2015-12-31 VITALS — BP 150/94 | HR 87

## 2015-12-31 DIAGNOSIS — R51 Headache: Secondary | ICD-10-CM | POA: Insufficient documentation

## 2015-12-31 DIAGNOSIS — I10 Essential (primary) hypertension: Secondary | ICD-10-CM | POA: Diagnosis present

## 2015-12-31 DIAGNOSIS — E162 Hypoglycemia, unspecified: Secondary | ICD-10-CM | POA: Insufficient documentation

## 2015-12-31 DIAGNOSIS — R5383 Other fatigue: Secondary | ICD-10-CM | POA: Diagnosis not present

## 2015-12-31 DIAGNOSIS — E119 Type 2 diabetes mellitus without complications: Secondary | ICD-10-CM | POA: Insufficient documentation

## 2015-12-31 NOTE — Progress Notes (Signed)
S:    Patient arrives in good spirits with his son.    Presents to the clinic for hypertension evaluation. Patient was referred on 12/08/15 by Chari Manning.  Patient was last seen by Primary Care Provider on 12/08/15.   Patient reports adherence with medications.  Current BP Medications include:  Amlodipine 10 mg daily  Patient feels like he has some episodes of hypoglycemia and it is really stressing him out and making his feel poorly overall. He has only seen one episode of hypoglycemia on his meter and the reading was 50.  He appropriately treated the episode of hypoglycemia with candies and he felt much better.  He also has a lot of generalized malaise/fatigue and headaches.   O:   Last 3 Office BP readings: BP Readings from Last 3 Encounters:  12/31/15 150/94  12/16/15 166/102  12/08/15 138/88    BMET    Component Value Date/Time   NA 142 12/08/2015 1723   K 4.6 12/08/2015 1723   CL 110 12/08/2015 1723   CO2 21 12/08/2015 1723   GLUCOSE 86 12/08/2015 1723   BUN 31* 12/08/2015 1723   CREATININE 1.51* 12/08/2015 1723   CREATININE 1.61* 11/26/2015 0506   CALCIUM 8.9 12/08/2015 1723   GFRNONAA 55* 12/08/2015 1723   GFRNONAA 51* 11/26/2015 0506   GFRAA 64 12/08/2015 1723   GFRAA 59* 11/26/2015 0506    A/P: Hypertension longstanding currently uncontrolled on current medications. Continue amlodipine 10 mg daily. Would like to add ACEi or ARB for renal protection but he has had hyperkalemia in the past. I also wonder if patient felt better if his blood pressure would improve - he reports significant stress from diabetes management and fear of hypoglycemia. Patient would like to continue lifestyle changes and follow up with PCP for blood glucose before adding another blood pressure medication. It is not urgently elevated so encouraged patient to maintain adherent to his blood pressure medication and to follow up with Chari Manning next week for hypoglycemia. If kidneys can tolerate,  would consider SGLT-2 for both blood glucose control and blood pressure reduction.  Results reviewed and written information provided.   Total time in face-to-face counseling 20 minutes.   F/U Clinic Visit Chari Manning

## 2015-12-31 NOTE — Patient Instructions (Addendum)
Thanks for coming to see me!  Continue the amlodipine 10 mg every day  Make an appointment to see Chari Manning for blood sugar follow up. Make that appointment for next week or the following week  Hypoglycemia Low blood sugar (hypoglycemia) means that the level of sugar in your blood is lower than it should be. Signs of low blood sugar include:  Getting sweaty.  Feeling hungry.  Feeling dizzy or weak.  Feeling sleepier than normal.  Feeling nervous.  Headaches.  Having a fast heartbeat. Low blood sugar can happen fast and can be an emergency. Your doctor can do tests to check your blood sugar level. You can have low blood sugar and not have diabetes. HOME CARE  Check your blood sugar as told by your doctor. If it is less than 70 mg/dl or as told by your doctor, take 1 of the following:  3 to 4 glucose tablets.   cup clear juice.   cup soda pop, not diet.  1 cup milk.  5 to 6 hard candies.  Recheck blood sugar after 15 minutes. Repeat until it is at the right level.  Eat a snack if it is more than 1 hour until the next meal.  Only take medicine as told by your doctor.  Do not skip meals. Eat on time.  Do not drink alcohol except with meals.  Check your blood glucose before driving.  Check your blood glucose before and after exercise.  Always carry treatment with you, such as glucose pills.  Always wear a medical alert bracelet if you have diabetes. GET HELP RIGHT AWAY IF:   Your blood glucose goes below 70 mg/dl or as told by your doctor, and you:  Are confused.  Are not able to swallow.  Pass out (faint).  You cannot treat yourself. You may need someone to help you.  You have low blood sugar problems often.  You have problems from your medicines.  You are not feeling better after 3 to 4 days.  You have vision changes. MAKE SURE YOU:   Understand these instructions.  Will watch this condition.  Will get help right away if you are not  doing well or get worse.   This information is not intended to replace advice given to you by your health care provider. Make sure you discuss any questions you have with your health care provider.   Document Released: 12/14/2009 Document Revised: 10/10/2014 Document Reviewed: 05/26/2015 Elsevier Interactive Patient Education [employer].

## 2016-01-08 ENCOUNTER — Ambulatory Visit: Payer: BLUE CROSS/BLUE SHIELD | Attending: Internal Medicine | Admitting: Internal Medicine

## 2016-01-08 VITALS — BP 168/98 | HR 88 | Ht 72.0 in

## 2016-01-08 DIAGNOSIS — E049 Nontoxic goiter, unspecified: Secondary | ICD-10-CM | POA: Diagnosis not present

## 2016-01-08 DIAGNOSIS — R946 Abnormal results of thyroid function studies: Secondary | ICD-10-CM | POA: Diagnosis not present

## 2016-01-08 DIAGNOSIS — Z794 Long term (current) use of insulin: Secondary | ICD-10-CM | POA: Diagnosis not present

## 2016-01-08 DIAGNOSIS — IMO0002 Reserved for concepts with insufficient information to code with codable children: Secondary | ICD-10-CM

## 2016-01-08 DIAGNOSIS — I10 Essential (primary) hypertension: Secondary | ICD-10-CM | POA: Diagnosis not present

## 2016-01-08 DIAGNOSIS — R52 Pain, unspecified: Secondary | ICD-10-CM | POA: Diagnosis not present

## 2016-01-08 DIAGNOSIS — E1165 Type 2 diabetes mellitus with hyperglycemia: Secondary | ICD-10-CM | POA: Diagnosis not present

## 2016-01-08 DIAGNOSIS — Z79899 Other long term (current) drug therapy: Secondary | ICD-10-CM | POA: Insufficient documentation

## 2016-01-08 DIAGNOSIS — E118 Type 2 diabetes mellitus with unspecified complications: Secondary | ICD-10-CM

## 2016-01-08 DIAGNOSIS — R7989 Other specified abnormal findings of blood chemistry: Secondary | ICD-10-CM

## 2016-01-08 DIAGNOSIS — Z87891 Personal history of nicotine dependence: Secondary | ICD-10-CM | POA: Insufficient documentation

## 2016-01-08 LAB — T3: T3, Total: 165 ng/dL (ref 76–181)

## 2016-01-08 LAB — GLUCOSE, POCT (MANUAL RESULT ENTRY): POC Glucose: 97 mg/dl (ref 70–99)

## 2016-01-08 LAB — T4, FREE: FREE T4: 2.1 ng/dL — AB (ref 0.8–1.8)

## 2016-01-08 LAB — TSH: TSH: 0.01 m[IU]/L — AB (ref 0.40–4.50)

## 2016-01-08 MED ORDER — AMLODIPINE BESYLATE 10 MG PO TABS: 10.0000 mg | ORAL_TABLET | Freq: Every day | ORAL | Status: DC

## 2016-01-08 MED FILL — AMLODIPINE BESYLATE 10 MG T: 10 | 30 days supply | Qty: 30 | Fill #0

## 2016-01-08 NOTE — Progress Notes (Signed)
Patient ID: Edward [name], male   DOB: 15-Mar-1971, 45 y.o.   MRN: [id number]  CC: blood sugar follow up  HPI: Edward [name] is a 45 y.o. male here today for a follow up visit.  Patient has past medical history of diabetes and hypertension. Patient states that has been out of all his medications for the past three days. He states over the past month he has been having complaints of generalized pain over his whole body. He often feels very fatigued and weak. He does not like his diabetes is causing this problem. He has complaints of weight changes and bowel changes.   No Known Allergies Past Medical History  Diagnosis Date  . Diabetes mellitus   . Hypertension   . Non-compliance    Current Outpatient Prescriptions on File Prior to Visit  Medication Sig Dispense Refill  . amLODipine (NORVASC) 10 MG tablet Take 1 tablet (10 mg total) by mouth daily. 30 tablet 2  . Blood Glucose Monitoring Suppl (TRUE METRIX METER) W/DEVICE KIT Check blood sugar before meals and at bedtime 1 kit 0  . glimepiride (AMARYL) 4 MG tablet Take 1 tablet (4 mg total) by mouth daily with breakfast. 30 tablet 4  . glucose blood (TRUE METRIX BLOOD GLUCOSE TEST) test strip Use as instructed 100 each 12  . TRUEPLUS LANCETS 28G MISC Check blood sugar before meals and at bedtime 100 each 11  . [DISCONTINUED] gabapentin (NEURONTIN) 100 MG capsule Take 1 capsule (100 mg total) by mouth 3 (three) times daily. 60 capsule 0   No current facility-administered medications on file prior to visit.   Family History  Problem Relation Age of Onset  . Hypertension Mother   . Diabetes Mother    Social History   Social History  . Marital Status: Married    Spouse Name: N/A  . Number of Children: N/A  . Years of Education: N/A   Occupational History  . Not on file.   Social History Main Topics  . Smoking status: Former Smoker -- 0.75 packs/day for 3 years    Types: Cigarettes    Quit date: 05/28/2000  . Smokeless  tobacco: Not on file  . Alcohol Use: 0.0 oz/week    0 Standard drinks or equivalent per week     Comment: 1 beer every night  . Drug Use: No  . Sexual Activity: Not on file   Other Topics Concern  . Not on file   Social History Narrative    Review of Systems: Other than what is stated in HPI, all other systems are negative.   Objective:   Filed Vitals:   01/08/16 1457  BP: 168/98  Pulse: 88    Physical Exam  Constitutional: He is oriented to person, place, and time.  Neck: Thyromegaly present.  Cardiovascular: Normal rate, regular rhythm and normal heart sounds.   Pulmonary/Chest: Effort normal and breath sounds normal.  Neurological: He is alert and oriented to person, place, and time.  Skin: Skin is warm and dry.     Lab Results  Component Value Date   WBC 9.4 11/25/2015   HGB 10.9* 11/25/2015   HCT 31.6* 11/25/2015   MCV 84.7 11/25/2015   PLT 279 11/25/2015   Lab Results  Component Value Date   CREATININE 1.51* 12/08/2015   BUN 31* 12/08/2015   NA 142 12/08/2015   K 4.6 12/08/2015   CL 110 12/08/2015   CO2 21 12/08/2015    Lab Results  Component Value Date  HGBA1C 8.2* 11/25/2015   Lipid Panel     Component Value Date/Time   CHOL 156 11/25/2015 1022   TRIG 118 11/25/2015 1022   HDL 48 11/25/2015 1022   CHOLHDL 3.3 11/25/2015 1022   VLDL 24 11/25/2015 1022   LDLCALC 84 11/25/2015 1022       Assessment and plan:   Edward [name] was seen today for follow-up and blood sugar problem.  Diagnoses and all orders for this visit:  Low TSH level -     TSH -     T4, Free -     Ambulatory referral to Endocrinology -     T3 Review of EMR reveals that patient went to ER and had a low TSH and also had one here last year that was low but he was never informed by nursing staff. It is very likely that patient is suffering from symptoms of thyroid. I will recheck today for confirmation but I will place Endocrinology referral today.  His HR is normal. But he  will likely require Tapazole soon.   Thyroid enlargement -     Ambulatory referral to Endocrinology Advised patient that he should call back in one week to speak to referral coordinator if he has not heard from Endocrinology. I have explained the seriousness of thyroid disease and how it can affect his whole body if left untreated.   Uncontrolled type 2 diabetes mellitus with complication, without long-term current use of insulin (HCC) -     Glucose (CBG) CBG's are improving since he is back on medication. He states that he has had 2 low readings but that was the only time he checked his sugars. I have stressed that he needs to check his sugars at least 1-2 times daily and bring back for PharmD to review. She may lower SU dose. Also may be a option for DDP-$ or SGLT-2  If needed.   Uncontrolled hypertension -     Refilled amLODipine (NORVASC) 10 MG tablet; Take 1 tablet (10 mg total) by mouth daily. Proper treatment of thyroid may help BP as well. He did not take his BP medication today. Will need close monitoring and second agent may be required. Stressed that he take his medication daily around the same time so that he can get into the habit of remembering to take medication. Long term complications discussed.    Return in about 2 weeks (around 01/22/2016) for stacy-log review and 3 mo PCP DM.   Lance Bosch, [facility] [phone] 01/08/2016, 3:19 PM

## 2016-01-08 NOTE — Patient Instructions (Signed)
Hyperthyroidism Hyperthyroidism is when the thyroid is too active (overactive). Your thyroid is a large gland that is located in your neck. The thyroid helps to control how your body uses food (metabolism). When your thyroid is overactive, it produces too much of a hormone called thyroxine.  CAUSES Causes of hyperthyroidism may include:  Graves disease. This is when your immune system attacks the thyroid gland. This is the most common cause.  Inflammation of the thyroid gland.  Tumor in the thyroid gland or somewhere else.  Excessive use of thyroid medicines, including:  Prescription thyroid supplement.  Herbal supplements that mimic thyroid hormones.  Solid or fluid-filled lumps within your thyroid gland (thyroid nodules).  Excessive ingestion of iodine. RISK FACTORS  Being male.  Having a family history of thyroid conditions. SIGNS AND SYMPTOMS Signs and symptoms of hyperthyroidism may include:  Nervousness.  Inability to tolerate heat.  Unexplained weight loss.  Diarrhea.  Change in the texture of hair or skin.  Heart skipping beats or making extra beats.  Rapid heart rate.  Loss of menstruation.  Shaky hands.  Fatigue.  Restlessness.  Increased appetite.  Sleep problems.  Enlarged thyroid gland or nodules. DIAGNOSIS  Diagnosis of hyperthyroidism may include:  Medical history and physical exam.  Blood tests.  Ultrasound tests. TREATMENT Treatment may include:  Medicines to control your thyroid.  Surgery to remove your thyroid.  Radiation therapy. HOME CARE INSTRUCTIONS   Take medicines only as directed by your health care provider.  Do not use any tobacco products, including cigarettes, chewing tobacco, or electronic cigarettes. If you need help quitting, ask your health care provider.  Do not exercise or do physical activity until your health care provider approves.  Keep all follow-up appointments as directed by your health care  provider. This is important. SEEK MEDICAL CARE IF:  Your symptoms do not get better with treatment.  You have fever.  You are taking thyroid replacement medicine and you:  Have depression.  Feel mentally and physically slow.  Have weight gain. SEEK IMMEDIATE MEDICAL CARE IF:   You have decreased alertness or a change in your awareness.  You have abdominal pain.  You feel dizzy.  You have a rapid heartbeat.  You have an irregular heartbeat.   This information is not intended to replace advice given to you by your health care provider. Make sure you discuss any questions you have with your health care provider.   Document Released: 09/19/2005 Document Revised: 10/10/2014 Document Reviewed: 02/04/2014 Elsevier Interactive Patient Education [employer].

## 2016-01-08 NOTE — Progress Notes (Signed)
Patient's here for f/up blood sugar.  Patient states he's been out of all his meds x3days now.  Patient been feeling really fatigue and low energy.  Patient c/o of constant pain over entire body that's ongoing since last OV. Pain rated 7/10.   Medication refill request.

## 2016-01-12 MED FILL — GLIMEPIRIDE 4 MG TABLET: 4 | 30 days supply | Qty: 30 | Fill #0

## 2016-01-14 ENCOUNTER — Telehealth: Payer: Self-pay

## 2016-01-14 NOTE — Telephone Encounter (Signed)
-----   Message from Lance Bosch, NP sent at 01/13/2016  7:53 PM EDT ----- Patient has hyperthyroidism. Has Endocrinology called yet

## 2016-01-14 NOTE — Telephone Encounter (Signed)
Patient verified name and DOB. Patient was given lab results, verbalized understanding with no further questions. Patient stated he hasn't heard from the Endocrinology. I informed he that if he hasn't heard from the specialist by Tuesday, 18th then to give [place] a call and we'll f/up.

## 2016-01-26 ENCOUNTER — Ambulatory Visit: Payer: BLUE CROSS/BLUE SHIELD | Admitting: Pharmacist

## 2016-02-10 ENCOUNTER — Emergency Department (HOSPITAL_COMMUNITY): Payer: BLUE CROSS/BLUE SHIELD

## 2016-02-10 ENCOUNTER — Emergency Department (HOSPITAL_COMMUNITY)
Admission: EM | Admit: 2016-02-10 | Discharge: 2016-02-10 | Disposition: A | Discharge status: Home / Self Care | Payer: BLUE CROSS/BLUE SHIELD | Attending: Emergency Medicine | Admitting: Emergency Medicine

## 2016-02-10 ENCOUNTER — Encounter (HOSPITAL_COMMUNITY): Payer: Self-pay | Admitting: Nurse Practitioner

## 2016-02-10 DIAGNOSIS — H547 Unspecified visual loss: Secondary | ICD-10-CM | POA: Diagnosis present

## 2016-02-10 DIAGNOSIS — H16001 Unspecified corneal ulcer, right eye: Secondary | ICD-10-CM | POA: Insufficient documentation

## 2016-02-10 DIAGNOSIS — H538 Other visual disturbances: Secondary | ICD-10-CM | POA: Insufficient documentation

## 2016-02-10 DIAGNOSIS — I10 Essential (primary) hypertension: Secondary | ICD-10-CM | POA: Diagnosis not present

## 2016-02-10 DIAGNOSIS — E119 Type 2 diabetes mellitus without complications: Secondary | ICD-10-CM | POA: Diagnosis not present

## 2016-02-10 DIAGNOSIS — Z79899 Other long term (current) drug therapy: Secondary | ICD-10-CM | POA: Insufficient documentation

## 2016-02-10 DIAGNOSIS — Z87891 Personal history of nicotine dependence: Secondary | ICD-10-CM | POA: Diagnosis not present

## 2016-02-10 DIAGNOSIS — Z9119 Patient's noncompliance with other medical treatment and regimen: Secondary | ICD-10-CM | POA: Diagnosis not present

## 2016-02-10 LAB — COMPREHENSIVE METABOLIC PANEL
ALBUMIN: 3 g/dL — AB (ref 3.5–5.0)
ALK PHOS: 108 U/L (ref 38–126)
ALT: 34 U/L (ref 17–63)
AST: 29 U/L (ref 15–41)
Anion gap: 7 (ref 5–15)
BILIRUBIN TOTAL: 0.3 mg/dL (ref 0.3–1.2)
BUN: 30 mg/dL — AB (ref 6–20)
CALCIUM: 8.8 mg/dL — AB (ref 8.9–10.3)
CO2: 21 mmol/L — AB (ref 22–32)
CREATININE: 1.91 mg/dL — AB (ref 0.61–1.24)
Chloride: 113 mmol/L — ABNORMAL HIGH (ref 101–111)
GFR calc Af Amer: 48 mL/min — ABNORMAL LOW (ref >60)
GFR calc non Af Amer: 41 mL/min — ABNORMAL LOW (ref >60)
GLUCOSE: 86 mg/dL (ref 65–99)
Potassium: 4.7 mmol/L (ref 3.5–5.1)
SODIUM: 141 mmol/L (ref 135–145)
Total Protein: 6.2 g/dL — ABNORMAL LOW (ref 6.5–8.1)

## 2016-02-10 LAB — DIFFERENTIAL
Basophils Absolute: 0 10*3/uL (ref 0.0–0.1)
Basophils Relative: 0 %
Eosinophils Absolute: 0.1 10*3/uL (ref 0.0–0.7)
Eosinophils Relative: 1 %
LYMPHS ABS: 1.8 10*3/uL (ref 0.7–4.0)
LYMPHS PCT: 31 %
Monocytes Absolute: 0.4 10*3/uL (ref 0.1–1.0)
Monocytes Relative: 8 %
NEUTROS ABS: 3.5 10*3/uL (ref 1.7–7.7)
NEUTROS PCT: 60 %

## 2016-02-10 LAB — I-STAT CHEM 8, ED
BUN: 30 mg/dL — AB (ref 6–20)
CALCIUM ION: 1.18 mmol/L (ref 1.12–1.23)
CHLORIDE: 111 mmol/L (ref 101–111)
CREATININE: 1.9 mg/dL — AB (ref 0.61–1.24)
GLUCOSE: 81 mg/dL (ref 65–99)
HCT: 24 % — ABNORMAL LOW (ref 39.0–52.0)
Hemoglobin: 8.2 g/dL — ABNORMAL LOW (ref 13.0–17.0)
Potassium: 4.4 mmol/L (ref 3.5–5.1)
SODIUM: 141 mmol/L (ref 135–145)
TCO2: 19 mmol/L (ref 0–100)

## 2016-02-10 LAB — APTT: aPTT: 29 seconds (ref 24–37)

## 2016-02-10 LAB — PROTIME-INR
INR: 1.08 (ref 0.00–1.49)
PROTHROMBIN TIME: 14.2 s (ref 11.6–15.2)

## 2016-02-10 LAB — CBC
HCT: 24.2 % — ABNORMAL LOW (ref 39.0–52.0)
HEMOGLOBIN: 8.4 g/dL — AB (ref 13.0–17.0)
MCH: 29.5 pg (ref 26.0–34.0)
MCHC: 34.7 g/dL (ref 30.0–36.0)
MCV: 84.9 fL (ref 78.0–100.0)
PLATELETS: 256 10*3/uL (ref 150–400)
RBC: 2.85 MIL/uL — ABNORMAL LOW (ref 4.22–5.81)
RDW: 13.4 % (ref 11.5–15.5)
WBC: 5.8 10*3/uL (ref 4.0–10.5)

## 2016-02-10 LAB — I-STAT TROPONIN, ED: Troponin i, poc: 0.01 ng/mL (ref 0.00–0.08)

## 2016-02-10 MED ORDER — FLUORESCEIN SODIUM 1 MG OP STRP
1.0000 | ORAL_STRIP | Freq: Once | OPHTHALMIC | Status: AC
  Administered 2016-02-10: 1 via OPHTHALMIC
  Filled 2016-02-10: qty 1

## 2016-02-10 MED ORDER — DIPHENHYDRAMINE HCL 50 MG/ML IJ SOLN
25.0000 mg | Freq: Once | INTRAMUSCULAR | Status: AC
Start: 2016-02-10 — End: 2016-02-10
  Administered 2016-02-10: 25 mg via INTRAVENOUS
  Filled 2016-02-10: qty 1

## 2016-02-10 MED ORDER — METOCLOPRAMIDE HCL 5 MG/ML IJ SOLN
10.0000 mg | Freq: Once | INTRAMUSCULAR | Status: AC
  Administered 2016-02-10: 10 mg via INTRAVENOUS
  Filled 2016-02-10: qty 2

## 2016-02-10 MED ORDER — KETOROLAC TROMETHAMINE 30 MG/ML IJ SOLN
30.0000 mg | Freq: Once | INTRAMUSCULAR | Status: AC
  Administered 2016-02-10: 30 mg via INTRAVENOUS
  Filled 2016-02-10: qty 1

## 2016-02-10 MED ORDER — TETRACAINE HCL 0.5 % OP SOLN
2.0000 [drp] | Freq: Once | OPHTHALMIC | Status: AC
  Administered 2016-02-10: 2 [drp] via OPHTHALMIC
  Filled 2016-02-10: qty 2

## 2016-02-10 MED ORDER — ERYTHROMYCIN 5 MG/GM OP OINT: TOPICAL_OINTMENT | OPHTHALMIC | Status: DC

## 2016-02-10 MED ORDER — SODIUM CHLORIDE 0.9 % IV BOLUS (SEPSIS)
1000.0000 mL | Freq: Once | INTRAVENOUS | Status: AC
  Administered 2016-02-10: 1000 mL via INTRAVENOUS

## 2016-02-10 NOTE — ED Provider Notes (Signed)
CSN: [id number]     Arrival date & time 02/10/16  1257 History   First MD Initiated Contact with Patient 02/10/16 1545     Chief Complaint  Patient presents with  . Loss of Vision   HPI   Edward [name] is a 45 y.o. male PMH significant for diabetes, hypertension presenting with a 1 day history of blurred vision in right eye. He states last night he began seeing spots in his right eye. He developed a frontal headache (nonradiating, constant, 7 out of 10 pain scale, no alleviation attempts or exacerbating factors). His right eye then became blurry and has remains blurry since. He states his difficult to see because of his blurred vision. No slurred speech, weakness, facial droop, fevers, chills, cough, recent illness, neck stiffness. He states he has had laser eye surgery but is unsure if it was his right or his left eye.  Past Medical History  Diagnosis Date  . Diabetes mellitus   . Hypertension   . Non-compliance    Past Surgical History  Procedure Laterality Date  . Eye surgery     Family History  Problem Relation Age of Onset  . Hypertension Mother   . Diabetes Mother    Social History  Substance Use Topics  . Smoking status: Former Smoker -- 0.75 packs/day for 3 years    Types: Cigarettes    Quit date: 05/28/2000  . Smokeless tobacco: None  . Alcohol Use: 0.0 oz/week    0 Standard drinks or equivalent per week     Comment: 1 beer every night    Review of Systems  Ten systems are reviewed and are negative for acute change except as noted in the HPI  Allergies  Review of patient's allergies indicates no known allergies.  Home Medications   Prior to Admission medications   Medication Sig Start Date End Date Taking? Authorizing Provider  amLODipine (NORVASC) 10 MG tablet Take 1 tablet (10 mg total) by mouth daily. 01/08/16   Lance Bosch, NP  Blood Glucose Monitoring Suppl (TRUE METRIX METER) W/DEVICE KIT Check blood sugar before meals and at bedtime 05/29/15    Lance Bosch, NP  glimepiride (AMARYL) 4 MG tablet Take 1 tablet (4 mg total) by mouth daily with breakfast. 12/08/15   Lance Bosch, NP  glucose blood (TRUE METRIX BLOOD GLUCOSE TEST) test strip Use as instructed 05/29/15   Lance Bosch, NP  TRUEPLUS LANCETS 28G MISC Check blood sugar before meals and at bedtime 05/29/15   Lance Bosch, NP   BP 155/91 mmHg  Pulse 82  Temp(Src) 98.4 F (36.9 C) (Oral)  Resp 16  SpO2 100% Physical Exam  Constitutional: He is oriented to person, place, and time. He appears well-developed and well-nourished. No distress.  HENT:  Head: Normocephalic and atraumatic.  Mouth/Throat: Oropharynx is clear and moist. No oropharyngeal exudate.  Eyes: EOM are normal. Pupils are equal, round, and reactive to light. Right eye exhibits no discharge. Left eye exhibits no discharge. No scleral icterus.    Pinguecula bilaterally. Tonopen IOP 24 BL.   Neck: Normal range of motion. Neck supple. No tracheal deviation present.  Cardiovascular: Normal rate, regular rhythm, normal heart sounds and intact distal pulses.  Exam reveals no gallop and no friction rub.   No murmur heard. Pulmonary/Chest: Effort normal and breath sounds normal. No respiratory distress. He has no wheezes. He has no rales. He exhibits no tenderness.  Abdominal: Soft. Bowel sounds are normal. He exhibits  no distension and no mass. There is no tenderness. There is no rebound and no guarding.  Musculoskeletal: Normal range of motion. He exhibits no edema or tenderness.  Neurovascularly intact bilaterally.  Lymphadenopathy:    He has no cervical adenopathy.  Neurological: He is alert and oriented to person, place, and time. No cranial nerve deficit. Coordination normal.  Normal rapid alternating movements, pronator drift, finger to nose.  Skin: Skin is warm and dry. No rash noted. He is not diaphoretic. No erythema.  Psychiatric: He has a normal mood and affect. His behavior is normal.  Nursing note  and vitals reviewed.   ED Course  Procedures Labs Review Labs Reviewed  CBC - Abnormal; Notable for the following:    RBC 2.85 (*)    Hemoglobin 8.4 (*)    HCT 24.2 (*)    All other components within normal limits  COMPREHENSIVE METABOLIC PANEL - Abnormal; Notable for the following:    Chloride 113 (*)    CO2 21 (*)    BUN 30 (*)    Creatinine, Ser 1.91 (*)    Calcium 8.8 (*)    Total Protein 6.2 (*)    Albumin 3.0 (*)    GFR calc non Af Amer 41 (*)    GFR calc Af Amer 48 (*)    All other components within normal limits  I-STAT CHEM 8, ED - Abnormal; Notable for the following:    BUN 30 (*)    Creatinine, Ser 1.90 (*)    Hemoglobin 8.2 (*)    HCT 24.0 (*)    All other components within normal limits  PROTIME-INR  APTT  DIFFERENTIAL  I-STAT TROPOININ, ED  CBG MONITORING, ED   Imaging Review Ct Head Wo Contrast  02/10/2016  CLINICAL DATA:  Visual changes of the right eye the past few days. Stroke symptoms. EXAM: CT HEAD WITHOUT CONTRAST TECHNIQUE: Contiguous axial images were obtained from the base of the skull through the vertex without intravenous contrast. COMPARISON:  None. FINDINGS: Skull and Sinuses:Negative for fracture or destructive process. The visualized mastoids, middle ears, and imaged paranasal sinuses are clear. Visualized orbits: Negative. Brain: Unremarkable. No evidence of acute infarction, hemorrhage, hydrocephalus, or mass lesion/mass effect. No evidence of white matter disease. IMPRESSION: Negative head CT. Electronically Signed   By: Monte Fantasia M.D.   On: 02/10/2016 14:39   I have personally reviewed and evaluated these images and lab results as part of my medical decision-making.   EKG Interpretation   Date/Time:  Wednesday Feb 10 2016 13:38:30 EDT Ventricular Rate:  75 PR Interval:  166 QRS Duration: 84 QT Interval:  382 QTC Calculation: 426 R Axis:   6 Text Interpretation:  Normal sinus rhythm Nonspecific T wave abnormality  Abnormal ECG  No significant change since last tracing Confirmed by [facility]  MD, Corene Cornea [phone]) on 02/10/2016 3:46:57 PM      MDM   Final diagnoses:  Blurred vision   Patient nontoxic appearing, VSS. Less likely globe rupture, central retinal artery occlusion, acute angle-close glaucoma, retinal detachment.  CT head negative.  6:35 pm: Appreciate ophthalmology consult. Advised OP follow-up; most likely diabetic retinopathy.  Encouraged patient to follow-up with ophthalmology as he is supposed to have yearly eye and foot exams. Will discharge with erythromycin ointment for corneal fluorescein uptake, although I do not feel this is the cause of patient's blurred vision as lesion is inferiorly located.  Patient may be safely discharged home. Discussed reasons for return. Patient in understanding and  agreement with the plan.    [place], PA-C 02/10/16 2035  Merrily Pew, MD 02/10/16 2112

## 2016-02-10 NOTE — Discharge Instructions (Signed)
Mr. Edward [name],  Nice meeting you! Please follow-up with ophthalmology. Return to the emergency department if you develop increased headaches or visual changes, slurred speech, weakness. Feel better soon!  S. Wendie Simmer, PA-C Blurred Vision Having blurred vision means that you cannot see things clearly. Your vision may seem fuzzy or out of focus. Blurred vision is a very common symptom of an eye or vision problem. Blurred vision is often a gradual blur that occurs in one eye or both eyes. There are many causes of blurred vision, including cataracts, macular degeneration, and diabetic retinopathy. Blurred vision can be diagnosed based on your symptoms and a physical exam. Tell your health care provider about any other health problems you have, any recent eye injury, and any prior surgeries. You may need to see a health care provider who specializes in eye problems (ophthalmologist). Your treatment depends on what is causing your blurred vision.  HOME CARE INSTRUCTIONS  Tell your health care provider about any changes in your blurred vision.  Do not drive or operate heavy machinery if your vision is blurry.  Keep all follow-up visits as directed by your health care provider. This is important. SEEK MEDICAL CARE IF:  Your symptoms get worse.  You have new symptoms.  You have trouble seeing at night.  You have trouble seeing up close or far away.  You have trouble noticing the difference between colors. SEEK IMMEDIATE MEDICAL CARE IF:  You have severe eye pain.  You have a severe headache.  You have flashing lights in your field of vision.  You have a sudden change in vision.  You have a sudden loss of vision.  You have vision change after an injury.  You notice drainage coming from your eyes.  You notice a rash around your eyes.   This information is not intended to replace advice given to you by your health care provider. Make sure you discuss any questions you  have with your health care provider.   Document Released: 09/22/2003 Document Revised: 02/03/2015 Document Reviewed: 08/13/2014 Elsevier Interactive Patient Education [employer].

## 2016-02-10 NOTE — ED Notes (Addendum)
Pt c/o several day history of spots in vision out of R eye, then last night he developed a headache and vision became blurry in R eye and has remained blurred since. States he can not see anything out of the R eye since last night. He reports he always feels weak, but no weaker than normal. He denies speech difficulty. No arm drift, grips are = bilaterally. He is alert and breathing easily

## 2016-02-10 NOTE — ED Notes (Signed)
Pt stable, ambulatory, states understanding of discharge instructions 

## 2016-02-17 MED FILL — GLIMEPIRIDE 4 MG TABLET: 4 | 30 days supply | Qty: 30 | Fill #1

## 2016-02-17 MED FILL — AMLODIPINE BESYLATE 10 MG T: 10 | 30 days supply | Qty: 30 | Fill #1

## 2016-02-17 MED FILL — OFLOXACIN 0.3% EYE DROPS: 0.3 | 30 days supply | Qty: 5 | Fill #0

## 2016-02-18 MED FILL — PREDNISOLONE AC 1% EYE DROP: 1 | 30 days supply | Qty: 10 | Fill #0

## 2016-02-26 ENCOUNTER — Ambulatory Visit (INDEPENDENT_AMBULATORY_CARE_PROVIDER_SITE_OTHER): Payer: BLUE CROSS/BLUE SHIELD | Admitting: Internal Medicine

## 2016-02-26 ENCOUNTER — Encounter: Payer: Self-pay | Admitting: Internal Medicine

## 2016-02-26 VITALS — BP 122/70 | HR 88 | Temp 98.3°F | Resp 12 | Ht 72.0 in | Wt 205.0 lb

## 2016-02-26 DIAGNOSIS — E0590 Thyrotoxicosis, unspecified without thyrotoxic crisis or storm: Secondary | ICD-10-CM | POA: Diagnosis not present

## 2016-02-26 LAB — T3, FREE: T3, Free: 5.1 pg/mL — ABNORMAL HIGH (ref 2.3–4.2)

## 2016-02-26 LAB — TSH: TSH: 0.07 u[IU]/mL — ABNORMAL LOW (ref 0.35–4.50)

## 2016-02-26 LAB — T4, FREE: Free T4: 1.97 ng/dL — ABNORMAL HIGH (ref 0.60–1.60)

## 2016-02-26 NOTE — Patient Instructions (Signed)
Please stop at the lab.  We may need to schedule a thyroid Uptake and scan depending on the results.  Please come back for a follow-up appointment in 3 months.  Hyperthyroidism Hyperthyroidism is when the thyroid is too active (overactive). Your thyroid is a large gland that is located in your neck. The thyroid helps to control how your body uses food (metabolism). When your thyroid is overactive, it produces too much of a hormone called thyroxine.  CAUSES Causes of hyperthyroidism may include:  Graves disease. This is when your immune system attacks the thyroid gland. This is the most common cause.  Inflammation of the thyroid gland.  Tumor in the thyroid gland or somewhere else.  Excessive use of thyroid medicines, including:  Prescription thyroid supplement.  Herbal supplements that mimic thyroid hormones.  Solid or fluid-filled lumps within your thyroid gland (thyroid nodules).  Excessive ingestion of iodine. RISK FACTORS  Being male.  Having a family history of thyroid conditions. SIGNS AND SYMPTOMS Signs and symptoms of hyperthyroidism may include:  Nervousness.  Inability to tolerate heat.  Unexplained weight loss.  Diarrhea.  Change in the texture of hair or skin.  Heart skipping beats or making extra beats.  Rapid heart rate.  Loss of menstruation.  Shaky hands.  Fatigue.  Restlessness.  Increased appetite.  Sleep problems.  Enlarged thyroid gland or nodules. DIAGNOSIS  Diagnosis of hyperthyroidism may include:  Medical history and physical exam.  Blood tests.  Ultrasound tests. TREATMENT Treatment may include:  Medicines to control your thyroid.  Surgery to remove your thyroid.  Radiation therapy. HOME CARE INSTRUCTIONS   Take medicines only as directed by your health care provider.  Do not use any tobacco products, including cigarettes, chewing tobacco, or electronic cigarettes. If you need help quitting, ask your health  care provider.  Do not exercise or do physical activity until your health care provider approves.  Keep all follow-up appointments as directed by your health care provider. This is important. SEEK MEDICAL CARE IF:  Your symptoms do not get better with treatment.  You have fever.  You are taking thyroid replacement medicine and you:  Have depression.  Feel mentally and physically slow.  Have weight gain. SEEK IMMEDIATE MEDICAL CARE IF:   You have decreased alertness or a change in your awareness.  You have abdominal pain.  You feel dizzy.  You have a rapid heartbeat.  You have an irregular heartbeat.   This information is not intended to replace advice given to you by your health care provider. Make sure you discuss any questions you have with your health care provider.   Document Released: 09/19/2005 Document Revised: 10/10/2014 Document Reviewed: 02/04/2014 Elsevier Interactive Patient Education [employer].

## 2016-02-26 NOTE — Progress Notes (Signed)
Patient ID: Edward [name], male   DOB: 10/15/1970, 45 y.o.   MRN: [id number]   HPI  Edward [name] is a 45 y.o.-year-old male, referred by his PCP, Dr. Feliciana Rossetti, for evaluation for thyrotoxicosis. He is here with his wife who offers part of the history.  I reviewed pt's thyroid tests: Lab Results  Component Value Date   TSH 0.01* 01/08/2016   TSH 0.018* 11/25/2015   TSH <0.008* 05/29/2015   TSH <0.008* 06/21/2013   FREET4 2.1* 01/08/2016   FREET4 1.27 05/29/2015    Pt denies feeling nodules in neck, hoarseness, dysphagia/odynophagia, + occasional SOB with lying down; he c/o: - +  weight loss - + fatigue - + excessive sweating/heat intolerance - + tremors - no anxiety - + insomnia - + occasionally palpitations - no hyperdefecation - + hair loss and thinning  Pt does not have a FH of thyroid ds. No FH of thyroid cancer. No h/o radiation tx to head or neck.  No seaweed or kelp, no recent contrast studies. No steroid use. No herbal supplements. No Biotin use.   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 He just had surgery in R eye, previously on the L eye - OU DR.  ROS: Constitutional: + see HPI, + excessive urination, + nocturia Eyes: + blurry vision, no xerophthalmia ENT: no sore throat, no nodules palpated in throat, no dysphagia/odynophagia, no hoarseness Cardiovascular: + CP/+ palpitations/+ leg swelling Respiratory: no cough/+ SOB Gastrointestinal: + N/no V/D/C Musculoskeletal: + both: muscle/joint aches Skin: no rashes, + hair loss Neurological: no tremors/numbness/tingling/dizziness, + HA Psychiatric: no depression/anxiety  Past Medical History  Diagnosis Date  . Diabetes mellitus   . Hypertension   . Non-compliance    Past Surgical History  Procedure Laterality Date  . Eye surgery     Social History   Social History  . Marital Status: Married    Spouse Name: N/A  . Number of Children: 5:  25-4 y/o (2017)  . Years of Education: N/A   Occupational History  . Chef-cook   Social History Main Topics  . Smoking status: Former Smoker -- 0.75 packs/day for 3 years    Types: Cigarettes    Quit date: 2004  . Smokeless tobacco: Not on file  . Alcohol Use:      Comment: 1 beer every night  . Drug Use: No   Current Outpatient Prescriptions on File Prior to Visit  Medication Sig Dispense Refill  . amLODipine (NORVASC) 10 MG tablet Take 1 tablet (10 mg total) by mouth daily. 30 tablet 2  . Blood Glucose Monitoring Suppl (TRUE METRIX METER) W/DEVICE KIT Check blood sugar before meals and at bedtime 1 kit 0  . glimepiride (AMARYL) 4 MG tablet Take 1 tablet (4 mg total) by mouth daily with breakfast. 30 tablet 4  . glucose blood (TRUE METRIX BLOOD GLUCOSE TEST) test strip Use as instructed 100 each 12  . TRUEPLUS LANCETS 28G MISC Check blood sugar before meals and at bedtime 100 each 11  . [DISCONTINUED] gabapentin (NEURONTIN) 100 MG capsule Take 1 capsule (100 mg total) by mouth 3 (three) times daily. 60 capsule 0   No current facility-administered medications on file prior to visit.   No Known Allergies Family History  Problem Relation Age of Onset  . Hypertension Mother   . Diabetes Mother    PE: BP 122/70 mmHg  Pulse 88  Temp(Src) 98.3  F (36.8 C) (Oral)  Resp 12  Ht 6' (1.829 m)  Wt 205 lb (92.987 kg)  BMI 27.80 kg/m2  SpO2 98% Wt Readings from Last 3 Encounters:  02/26/16 205 lb (92.987 kg)  12/08/15 211 lb (95.709 kg)  11/25/15 214 lb 6.4 oz (97.251 kg)   Constitutional: overweight, in NAD Eyes: PERRLA, EOMI, no exophthalmos - palpebral swelling B - but recent eye surgery, no lid lag, no stare ENT: moist mucous membranes, no thyromegaly - But left lobe slightly more prominent, no thyroid bruits, no cervical lymphadenopathy Cardiovascular: RRR, No MRG Respiratory: CTA B Gastrointestinal: abdomen soft, NT, ND, BS+ Musculoskeletal: no deformities, strength  intact in all 4 Skin: moist, warm, no rashes Neurological: no tremor with outstretched hands, DTR normal in all 4  ASSESSMENT: 1. 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 Patient with a low TSH dating since at least 2014, with thyrotoxic sxs: weight loss, heat intolerance, occasional palpitations, fatigue, generalized pain, insomnia. He was recently told that he has a low TSH and was referred to endocrinology for further evaluation and treatment. - We discussed about the side effects of prolonged thyrotoxicosis that include, but are not limited to arrhythmia, heart failure, hypercoagulability, osteoporosis. - he does not appear to have exogenous causes for the low TSH.  - We discussed that possible causes of thyrotoxicosis are:  Graves ds   Thyroiditis toxic multinodular goiter/ toxic adenoma (I cannot feel nodules at palpation of his thyroid although the left lobe is slightly more prominent),  - I suggested that we check the TSH, fT3 and fT4 and also add thyroid stimulating antibodies to screen for Graves' disease.  - If the tests remain abnormal, we may need an uptake and scan to differentiate between the 3 above possible etiologies  - we discussed about possible modalities of treatment for the above conditions, to include methimazole use, radioactive iodine ablation or (last resort) surgery. - we might need to do thyroid ultrasound depending on the results of the uptake and scan (if a cold nodule is present) - I do not feel that we need to add beta blockers at this time, since he is not tachycardic, anxious, or tremulous - I advised him to join my chart to communicate easier - he has it active but does not know how to get in   - RTC in 3 months, but likely sooner for repeat labs    Component     Latest Ref Rng 02/26/2016  TSH     0.35 - 4.50 uIU/mL 0.07 (L)  T4,Free(Direct)     0.60 - 1.60 ng/dL 1.97 (H)  Triiodothyronine,Free,Serum     2.3 - 4.2 pg/mL 5.1 (H)  TSI     <140  % baseline 463 (H)  Elevated Graves antibodies along with overt thyrotoxicosis. These are all indicative of Graves' disease. His thyrotoxicosis appears to be improving, with a TSH now detectable and increasing. Will suggest to start methimazole at 5 mg twice a day and recheck his TFTs in 1.5 months.

## 2016-03-01 LAB — THYROID STIMULATING IMMUNOGLOBULIN: TSI: 463 % baseline — ABNORMAL HIGH (ref <140)

## 2016-03-02 ENCOUNTER — Other Ambulatory Visit: Payer: Self-pay | Admitting: *Deleted

## 2016-03-02 ENCOUNTER — Telehealth: Payer: Self-pay | Admitting: Internal Medicine

## 2016-03-02 DIAGNOSIS — E0500 Thyrotoxicosis with diffuse goiter without thyrotoxic crisis or storm: Secondary | ICD-10-CM | POA: Insufficient documentation

## 2016-03-02 MED ORDER — METHIMAZOLE 5 MG PO TABS: 5.0000 mg | ORAL_TABLET | Freq: Three times a day (TID) | ORAL | Status: DC

## 2016-03-02 MED FILL — methIMAzole 5 MG TABS: 5 | 30 days supply | Qty: 90 | Fill #0

## 2016-03-02 NOTE — Telephone Encounter (Signed)
Returned call and spoke with pt. Advised him per Dr Arman Filter result note and scheduled follow up lab appt.

## 2016-03-02 NOTE — Telephone Encounter (Signed)
PT wife called and said that she just saw the lab results on MyChart and would like to know how they go about getting the medication that was recommended to him.

## 2016-03-21 ENCOUNTER — Encounter: Payer: Self-pay | Admitting: Internal Medicine

## 2016-03-21 ENCOUNTER — Ambulatory Visit: Payer: BLUE CROSS/BLUE SHIELD | Attending: Internal Medicine | Admitting: Internal Medicine

## 2016-03-21 VITALS — BP 143/77 | HR 74 | Temp 98.4°F | Resp 18 | Ht 72.0 in | Wt 208.0 lb

## 2016-03-21 DIAGNOSIS — E118 Type 2 diabetes mellitus with unspecified complications: Secondary | ICD-10-CM

## 2016-03-21 DIAGNOSIS — G729 Myopathy, unspecified: Secondary | ICD-10-CM

## 2016-03-21 DIAGNOSIS — E0590 Thyrotoxicosis, unspecified without thyrotoxic crisis or storm: Secondary | ICD-10-CM | POA: Diagnosis not present

## 2016-03-21 DIAGNOSIS — N521 Erectile dysfunction due to diseases classified elsewhere: Secondary | ICD-10-CM

## 2016-03-21 DIAGNOSIS — R32 Unspecified urinary incontinence: Secondary | ICD-10-CM

## 2016-03-21 DIAGNOSIS — E1165 Type 2 diabetes mellitus with hyperglycemia: Secondary | ICD-10-CM

## 2016-03-21 DIAGNOSIS — IMO0002 Reserved for concepts with insufficient information to code with codable children: Secondary | ICD-10-CM

## 2016-03-21 DIAGNOSIS — D539 Nutritional anemia, unspecified: Secondary | ICD-10-CM | POA: Diagnosis not present

## 2016-03-21 DIAGNOSIS — M5126 Other intervertebral disc displacement, lumbar region: Secondary | ICD-10-CM

## 2016-03-21 LAB — CBC WITH DIFFERENTIAL/PLATELET
BASOS ABS: 0 {cells}/uL (ref 0–200)
Basophils Relative: 0 %
EOS ABS: 130 {cells}/uL (ref 15–500)
Eosinophils Relative: 2 %
HEMATOCRIT: 26.3 % — AB (ref 38.5–50.0)
HEMOGLOBIN: 9.5 g/dL — AB (ref 13.2–17.1)
Lymphocytes Relative: 33 %
Lymphs Abs: 2145 cells/uL (ref 850–3900)
MCH: 29.2 pg (ref 27.0–33.0)
MCHC: 36.1 g/dL — ABNORMAL HIGH (ref 32.0–36.0)
MCV: 80.9 fL (ref 80.0–100.0)
MONO ABS: 455 {cells}/uL (ref 200–950)
MONOS PCT: 7 %
MPV: 8.2 fL (ref 7.5–12.5)
NEUTROS ABS: 3770 {cells}/uL (ref 1500–7800)
Neutrophils Relative %: 58 %
PLATELETS: 273 10*3/uL (ref 140–400)
RBC: 3.25 MIL/uL — ABNORMAL LOW (ref 4.20–5.80)
RDW: 12.8 % (ref 11.0–15.0)
WBC: 6.5 10*3/uL (ref 3.8–10.8)

## 2016-03-21 LAB — POCT GLYCOSYLATED HEMOGLOBIN (HGB A1C): HEMOGLOBIN A1C: 5.2

## 2016-03-21 LAB — GLUCOSE, POCT (MANUAL RESULT ENTRY): POC Glucose: 72 mg/dl (ref 70–99)

## 2016-03-21 MED ORDER — DICLOFENAC SODIUM 1 % TD GEL: 2.0000 g | Freq: Four times a day (QID) | TRANSDERMAL | Status: DC

## 2016-03-21 MED ORDER — SILDENAFIL CITRATE 25 MG PO TABS: 25.0000 mg | ORAL_TABLET | Freq: Every day | ORAL | Status: DC | PRN

## 2016-03-21 NOTE — Progress Notes (Signed)
Patient is here for Upper Body pain  Patient complains of upper body pain being present in the shoulders, chest and neck. Pain is scaled currently at an 8.  Patient has taken medication and patient has eaten today.

## 2016-03-21 NOTE — Progress Notes (Addendum)
Edward [name], is a 45 y.o. male  WUJ:[id number]  [id number]:[id number]  DOB - 1971-04-21  Chief Complaint  Patient presents with  . Follow-up        Subjective:   Edward [name] is a 45 y.o. male here today for a follow up visit for dm, w/ significant hx of mild thyrotoxicosis for which he is now on methimazole.  His main concerns is diffuse body aches /pains, especially around neck/upper back and lower back region.  Chronic lower back pain, w/ radiculupathy bilaterally, but seems worse now. C/o of urinary incontinence once few weeks ago, no loss of stool incontinence.  He is unable to lift his arms bilaterally above 90 deg to his body, can'd lift it up to his head or put on clothes b/c of severe pains to neck/upper back.    Also c/o of erectile dysfunction for several months now, unable to keep erection.  Married 12 years w/ his wife now, and very worried about this, since his wife is young in her 20s.  Has been doing well w/ his DM, but he has partial vision loss due to dm.  Has another eye appt tomorw for eye surgery due to vision loss. Currently wearing  Glasses in clinic room.  Patient has No headache, No chest pain, No abdominal pain - No Nausea, No Cough - SOB.  No problems updated.  ALLERGIES: No Known Allergies  PAST MEDICAL HISTORY: Past Medical History  Diagnosis Date  . Diabetes mellitus   . Hypertension   . Non-compliance     MEDICATIONS AT HOME: Prior to Admission medications   Medication Sig Start Date End Date Taking? Authorizing Provider  amLODipine (NORVASC) 10 MG tablet Take 1 tablet (10 mg total) by mouth daily. 01/08/16  Yes Lance Bosch, NP  Blood Glucose Monitoring Suppl (TRUE METRIX METER) W/DEVICE KIT Check blood sugar before meals and at bedtime 05/29/15  Yes Lance Bosch, NP  glimepiride (AMARYL) 4 MG tablet Take 1 tablet (4 mg total) by mouth daily with breakfast. 12/08/15  Yes Lance Bosch, NP  glucose blood (TRUE METRIX BLOOD GLUCOSE TEST)  test strip Use as instructed 05/29/15  Yes Lance Bosch, NP  methimazole (TAPAZOLE) 5 MG tablet Take 1 tablet (5 mg total) by mouth 3 (three) times daily. 03/02/16  Yes Philemon Kingdom, MD  prednisoLONE acetate (PRED FORTE) 1 % ophthalmic suspension Place 1 drop into the right eye every 4 (four) hours.  02/18/16  Yes Historical Provider, MD  TRUEPLUS LANCETS 28G MISC Check blood sugar before meals and at bedtime 05/29/15  Yes Lance Bosch, NP  diclofenac sodium (VOLTAREN) 1 % GEL Apply 2 g topically 4 (four) times daily. 03/21/16   Maren Reamer, MD  sildenafil (VIAGRA) 25 MG tablet Take 1 tablet (25 mg total) by mouth daily as needed for erectile dysfunction. If no effect, may double dose next day/time 03/21/16   Maren Reamer, MD     Objective:   Filed Vitals:   03/21/16 1726  BP: 143/77  Pulse: 74  Temp: 98.4 F (36.9 C)  TempSrc: Oral  Resp: 18  Height: 6' (1.829 m)  Weight: 208 lb (94.348 kg)  SpO2: 100%    Exam General appearance : Awake, alert, not in any distress. Speech Clear. Not toxic looking, pleasant. HEENT: Atraumatic and Normocephalic, wearing glasses. Neck: supple, no JVD.  Chest:Good air entry bilaterally, no added sounds. CVS: S1 S2 regular, no murmurs/gallups or rubs. Abdomen: Bowel sounds active, Non  tender and not distended with no gaurding, rigidity or rebound. Extremities: B/L Lower Ext shows no edema, both legs are warm to touch MS/ extension/flexion of arms ms 5/5, however, pain reproducible to shoulder blades and upper neck, unable to flex neck due to pain, but denies headache.   No muscle atrophy noted in upper or le extremitiies.  Back exam, mild ttp in l4-5 paraspinal and spinal processes, pain mildly reproducible to bilateral gluteal regions and lower back on leg raises/flextion/extension/rotation.  Rectal exam/ dry stool in vault, good rectal tone.  Neurology: Awake alert, and oriented X 3, CN II-XII grossly intact, Non focal Skin:No  Rash  Data Review Lab Results  Component Value Date   HGBA1C 5.2 03/21/2016   HGBA1C 8.2* 11/25/2015   HGBA1C 8.60 05/05/2015    Depression screen [facility] 2/9 03/21/2016 12/08/2015 05/05/2015  Decreased Interest 2 0 3  Down, Depressed, Hopeless 1 0 2  PHQ - 2 Score 3 0 5  Altered sleeping 0 - 1  Tired, decreased energy 0 - 3  Change in appetite 0 - 2  Feeling bad or failure about yourself  1 - 1  Trouble concentrating 3 - 0  Moving slowly or fidgety/restless 0 - 1  Suicidal thoughts 0 - 0  PHQ-9 Score 7 - 13    fobt -   Assessment & Plan   1.  type 2 diabetes mellitus with complication, without long-term current use of insulin (HCC) - much better controlled on current regimen, but unfortunately use to be so uncontrolled that already has dm retinopathy, neuropathy and renal insufficiency. - Glucose (CBG) 72 - HgB A1c 5.2 - Microalbumin/Creatinine Ratio, Urine - CBC with Differential - BASIC METABOLIC PANEL WITH GFR - continue amaryl 4 mg qd  2. Thyrotoxicosis without thyroid storm, unspecified thyrotoxicosis type Per endo, on methimazole  3. Deficiency anemia chk cbc  4. Urinary incontinence, unspecified incontinence type, w/ hx of herniated disc.  - MR Lumbar Spine W Wo Contrast; Future  5. Cervical myopathy - MR Cervical Spine W Wo Contrast; Future - MR Thoracic Spine W Wo Contrast; Future  6. Erectile dysfunction due to diseases classified elsewhere, likely due to dm - trial viagra  7. ckd 3, will rechk bmp today.  8. htn Decent, worse w/ pain suspected, on norvasc 10     Patient have been counseled extensively about nutrition and exercise  Return in about 3 months (around 06/21/2016), or if symptoms worsen or fail to improve.  The patient was given clear instructions to go to ER or return to medical center if symptoms don't improve, worsen or new problems develop. The patient verbalized understanding. The patient was told to call to get lab results if they  haven't heard anything in the next week.    Maren Reamer, MD, [place] [facility] [place], [place]   03/21/2016, 5:47 PM    03/23/16 pt has bc/bs, denied volterin gel until has failed 3 oral meds. Will try naproxen. - will try pt assistance program for viagra, since insurance will not cover.

## 2016-03-21 NOTE — Patient Instructions (Signed)
Herniated Disk A herniated disk occurs when a disk in your spine bulges out too far. Your spine (backbone) is made up of bones called vertebrae. A disk with a spongy center is located between each pair of bones. These disks act as shock absorbers when you move. A herniated disk can cause pain and muscle weakness.  HOME CARE  Take all medicines as told by your doctor.  Rest for 2 days and then start moving.  Do not sit or stand for long periods of time.  Maintain good posture when sitting and standing.  Avoid moving in a way that causes pain, such as bending or lifting.  When you are able to start lifting things again:  [place] with your knees.  Keep your back straight.  Hold heavy objects close to your body.  If you are overweight, ask your doctor about starting a weight-loss program.  When you are able to start exercising, ask your doctor how much and what type of exercise is best for you.  Work with a physical therapist on stretching and strengthening exercises for your back.  Do not wear high-heeled shoes.  Do not sleep on your belly.  Do not smoke.  Keep all follow-up visits as told by your doctor. GET HELP IF:  You have back or neck pain that is not getting better after 4 weeks.  You have very bad pain in your back or neck.  You have a loss of feeling (numbness), tingling, or weakness along with pain. GET HELP RIGHT AWAY IF:  You have tingling, weakness, or loss of feeling that makes you unable to use your arms or legs.  You are not able to control when you pee (urinate) or poop (bowel movement).  You have dizziness or fainting.  You have shortness of breath. MAKE SURE YOU:  Understand these instructions.  Will watch your condition.  Will get help right away if you are not doing well or get worse.   This information is not intended to replace advice given to you by your health care provider. Make sure you discuss any questions you have with your health  care provider.   Document Released: 02/03/2014 Document Reviewed: 02/03/2014 Elsevier Interactive Patient Education [employer].   - Erectile Dysfunction Erectile dysfunction is the inability to get or sustain a good enough erection to have sexual intercourse. Erectile dysfunction may involve:  Inability to get an erection.  Lack of enough hardness to allow penetration.  Loss of the erection before sex is finished.  Premature ejaculation. CAUSES  Certain drugs, such as:  Pain relievers.  Antihistamines.  Antidepressants.  Blood pressure medicines.  Water pills (diuretics).  Ulcer medicines.  Muscle relaxants.  Illegal drugs.  Excessive drinking.  Psychological causes, such as:  Anxiety.  Depression.  Sadness.  Exhaustion.  Performance fear.  Stress.  Physical causes, such as:  Artery problems. This may include diabetes, smoking, liver disease, or atherosclerosis.  High blood pressure.  Hormonal problems, such as low testosterone.  Obesity.  Nerve problems. This may include back or pelvic injuries, diabetes mellitus, multiple sclerosis, or Parkinson disease. SYMPTOMS  Inability to get an erection.  Lack of enough hardness to allow penetration.  Loss of the erection before sex is finished.  Premature ejaculation.  Normal erections at some times, but with frequent unsatisfactory episodes.  Orgasms that are not satisfactory in sensation or frequency.  Low sexual satisfaction in either partner because of erection problems.  A curved penis occurring with erection. The  curve may cause pain or may be too curved to allow for intercourse.  Never having nighttime erections. DIAGNOSIS Your caregiver can often diagnose this condition by:  Performing a physical exam to find other diseases or specific problems with the penis.  Asking you detailed questions about the problem.  Performing blood tests to check for diabetes mellitus or to  measure hormone levels.  Performing urine tests to find other underlying health conditions.  Performing an ultrasound exam to check for scarring.  Performing a test to check blood flow to the penis.  Doing a sleep study at home to measure nighttime erections. TREATMENT   You may be prescribed medicines by mouth.  You may be given medicine injections into the penis.  You may be prescribed a vacuum pump with a ring.  Penile implant surgery may be performed. You may receive:  An inflatable implant.  A semirigid implant.  Blood vessel surgery may be performed. HOME CARE INSTRUCTIONS  If you are prescribed oral medicine, you should take the medicine as prescribed. Do not increase the dosage without first discussing it with your physician.  If you are using self-injections, be careful to avoid any veins that are on the surface of the penis. Apply pressure to the injection site for 5 minutes.  If you are using a vacuum pump, make sure you have read the instructions before using it. Discuss any questions with your physician before taking the pump home. SEEK MEDICAL CARE IF:  You experience pain that is not responsive to the pain medicine you have been prescribed.  You experience nausea or vomiting. SEEK IMMEDIATE MEDICAL CARE IF:   When taking oral or injectable medications, you experience an erection that lasts longer than 4 hours. If your physician is unavailable, go to the nearest emergency room for evaluation. An erection that lasts much longer than 4 hours can result in permanent damage to your penis.  You have pain that is severe.  You develop redness, severe pain, or severe swelling of your penis.  You have redness spreading up into your groin or lower abdomen.  You are unable to pass your urine.   This information is not intended to replace advice given to you by your health care provider. Make sure you discuss any questions you have with your health care provider.     Document Released: 09/16/2000 Document Revised: 05/22/2013 Document Reviewed: 02/21/2013 Elsevier Interactive Patient Education [employer].

## 2016-03-22 ENCOUNTER — Other Ambulatory Visit: Payer: Self-pay | Admitting: Internal Medicine

## 2016-03-22 DIAGNOSIS — N183 Chronic kidney disease, stage 3 (moderate): Secondary | ICD-10-CM

## 2016-03-22 DIAGNOSIS — D649 Anemia, unspecified: Secondary | ICD-10-CM

## 2016-03-22 DIAGNOSIS — E1122 Type 2 diabetes mellitus with diabetic chronic kidney disease: Secondary | ICD-10-CM

## 2016-03-22 LAB — BASIC METABOLIC PANEL WITH GFR
BUN: 48 mg/dL — AB (ref 7–25)
CHLORIDE: 110 mmol/L (ref 98–110)
CO2: 23 mmol/L (ref 20–31)
CREATININE: 1.99 mg/dL — AB (ref 0.60–1.35)
Calcium: 9.2 mg/dL (ref 8.6–10.3)
GFR, Est African American: 46 mL/min — ABNORMAL LOW (ref >=60)
GFR, Est Non African American: 39 mL/min — ABNORMAL LOW (ref >=60)
GLUCOSE: 85 mg/dL (ref 65–99)
Potassium: 4.9 mmol/L (ref 3.5–5.3)
Sodium: 141 mmol/L (ref 135–146)

## 2016-03-22 LAB — MICROALBUMIN / CREATININE URINE RATIO
Creatinine, Urine: 165 mg/dL (ref 20–370)
MICROALB UR: 105 mg/dL — AB
Microalb Creat Ratio: 636 mcg/mg creat — ABNORMAL HIGH (ref <30)

## 2016-03-22 MED FILL — OFLOXACIN 0.3% EYE DROPS: 0.3 | 15 days supply | Qty: 5 | Fill #0

## 2016-03-23 ENCOUNTER — Telehealth: Payer: Self-pay | Admitting: Internal Medicine

## 2016-03-23 MED ORDER — NAPROXEN 500 MG PO TABS: 500.0000 mg | ORAL_TABLET | Freq: Two times a day (BID) | ORAL | Status: DC | PRN

## 2016-03-23 NOTE — Telephone Encounter (Signed)
Patient verified DOB Patient is aware of 3 MRI's being scheduled for Friday 04/01/16 with an arrival time of 12:45. Patient is also aware of Voltaren gel requiring PA and Viagra costing the patient 181$ co-pay for 3 pills. Patient would like alternatives if possible. MA advised patient of request being routed to PCP. No further questions at this time.

## 2016-03-23 NOTE — Telephone Encounter (Signed)
Pt. Called requesting to speak with his PCP regarding a medication that she had stated she would prescribe Him. Pt. Does not know the name of the medication. Please f/u with pt.

## 2016-03-23 NOTE — Addendum Note (Signed)
Addended byLottie Mussel T on: 03/23/2016 02:18 PM   Modules accepted: Orders, Medications

## 2016-03-23 NOTE — Telephone Encounter (Signed)
Called pt about meds and talked to him on phone, will run prior authorization on volterin and viagra. If declined, will see what is recd.

## 2016-03-24 ENCOUNTER — Telehealth: Payer: Self-pay | Admitting: *Deleted

## 2016-03-24 NOTE — Telephone Encounter (Signed)
Medical Assistant left message on patient's home and cell voicemail. Voicemail states to give a call back to Edward [name] with [facility] at [phone].  

## 2016-03-24 NOTE — Telephone Encounter (Signed)
-----   Message from Maren Reamer, MD sent at 03/22/2016  8:56 AM EDT ----- Please call pt w/ results.  Kidney function about same, ckd stage 3. Avoid nephrotoxic meds, such as NSAIDs. Remains anemic, if he can set up labs appt early in am, fasting, we can check to see why anemic as well as check his cholesterol levelss - future labs. thanks

## 2016-03-24 NOTE — Telephone Encounter (Signed)
Patient verified DOB Patient is aware of kidney function being the same which is stage 3. Patient is aware of avoiding alcohol and NSAIDS. Patient is aware of anemia remaining and having his levels rechecked in the morning with the patient being fasted  Patient will return a phone call to schedule his appointment. No further questions at this time.

## 2016-03-25 ENCOUNTER — Encounter (HOSPITAL_COMMUNITY)
Admission: RE | Disposition: A | Discharge status: Home / Self Care | Payer: Self-pay | Source: Ambulatory Visit | Attending: Ophthalmology

## 2016-03-25 ENCOUNTER — Encounter (HOSPITAL_COMMUNITY): Payer: Self-pay | Admitting: *Deleted

## 2016-03-25 ENCOUNTER — Inpatient Hospital Stay (HOSPITAL_COMMUNITY): Payer: BLUE CROSS/BLUE SHIELD | Admitting: Anesthesiology

## 2016-03-25 ENCOUNTER — Ambulatory Visit (HOSPITAL_COMMUNITY)
Admission: RE | Admit: 2016-03-25 | Discharge: 2016-03-25 | Disposition: A | Discharge status: Home / Self Care | Payer: BLUE CROSS/BLUE SHIELD | Source: Ambulatory Visit | Attending: Ophthalmology | Admitting: Ophthalmology

## 2016-03-25 DIAGNOSIS — Z7984 Long term (current) use of oral hypoglycemic drugs: Secondary | ICD-10-CM | POA: Insufficient documentation

## 2016-03-25 DIAGNOSIS — Z9119 Patient's noncompliance with other medical treatment and regimen: Secondary | ICD-10-CM | POA: Diagnosis not present

## 2016-03-25 DIAGNOSIS — N189 Chronic kidney disease, unspecified: Secondary | ICD-10-CM | POA: Insufficient documentation

## 2016-03-25 DIAGNOSIS — H4311 Vitreous hemorrhage, right eye: Secondary | ICD-10-CM | POA: Insufficient documentation

## 2016-03-25 DIAGNOSIS — I129 Hypertensive chronic kidney disease with stage 1 through stage 4 chronic kidney disease, or unspecified chronic kidney disease: Secondary | ICD-10-CM | POA: Diagnosis not present

## 2016-03-25 DIAGNOSIS — Z79899 Other long term (current) drug therapy: Secondary | ICD-10-CM | POA: Insufficient documentation

## 2016-03-25 DIAGNOSIS — E113599 Type 2 diabetes mellitus with proliferative diabetic retinopathy without macular edema, unspecified eye: Secondary | ICD-10-CM | POA: Diagnosis not present

## 2016-03-25 DIAGNOSIS — E0500 Thyrotoxicosis with diffuse goiter without thyrotoxic crisis or storm: Secondary | ICD-10-CM | POA: Insufficient documentation

## 2016-03-25 DIAGNOSIS — E1122 Type 2 diabetes mellitus with diabetic chronic kidney disease: Secondary | ICD-10-CM | POA: Diagnosis not present

## 2016-03-25 DIAGNOSIS — H44001 Unspecified purulent endophthalmitis, right eye: Secondary | ICD-10-CM | POA: Diagnosis present

## 2016-03-25 DIAGNOSIS — Z87891 Personal history of nicotine dependence: Secondary | ICD-10-CM | POA: Insufficient documentation

## 2016-03-25 HISTORY — DX: Thyrotoxicosis with diffuse goiter without thyrotoxic crisis or storm: E05.00

## 2016-03-25 HISTORY — DX: Headache, unspecified: R51.9

## 2016-03-25 HISTORY — DX: Reserved for inherently not codable concepts without codable children: IMO0001

## 2016-03-25 HISTORY — DX: Chronic kidney disease, unspecified: N18.9

## 2016-03-25 HISTORY — PX: PARS PLANA VITRECTOMY: SHX2166

## 2016-03-25 HISTORY — DX: Anemia, unspecified: D64.9

## 2016-03-25 HISTORY — DX: Unspecified osteoarthritis, unspecified site: M19.90

## 2016-03-25 HISTORY — DX: Headache: R51

## 2016-03-25 LAB — GLUCOSE, CAPILLARY
GLUCOSE-CAPILLARY: 60 mg/dL — AB (ref 65–99)
GLUCOSE-CAPILLARY: 63 mg/dL — AB (ref 65–99)
GLUCOSE-CAPILLARY: 92 mg/dL (ref 65–99)
Glucose-Capillary: 77 mg/dL (ref 65–99)
Glucose-Capillary: 61 mg/dL — ABNORMAL LOW (ref 65–99)
Glucose-Capillary: 77 mg/dL (ref 65–99)

## 2016-03-25 SURGERY — PARS PLANA VITRECTOMY 25 GAUGE FOR ENDOPHTHALMITIS
Anesthesia: Monitor Anesthesia Care | Site: Eye | Laterality: Right

## 2016-03-25 MED ORDER — LIDOCAINE HCL (PF) 1 % IJ SOLN
INTRAMUSCULAR | Status: AC
  Filled 2016-03-25: qty 30

## 2016-03-25 MED ORDER — BSS PLUS IO SOLN
INTRAOCULAR | Status: AC
  Filled 2016-03-25: qty 500

## 2016-03-25 MED ORDER — HYPROMELLOSE (GONIOSCOPIC) 2.5 % OP SOLN
OPHTHALMIC | Status: AC
  Filled 2016-03-25: qty 15

## 2016-03-25 MED ORDER — CEFTAZIDIME INTRAVITREAL INJECTION 2.25 MG/0.1 ML
2.2500 mg | INTRAVITREAL | Status: DC
  Filled 2016-03-25: qty 0.1

## 2016-03-25 MED ORDER — NA CHONDROIT SULF-NA HYALURON 40-30 MG/ML IO SOLN
INTRAOCULAR | Status: AC
  Filled 2016-03-25: qty 0.5

## 2016-03-25 MED ORDER — VANCOMYCIN INTRAVITREAL INJECTION 1 MG/0.1 ML
INTRAOCULAR | Status: DC | PRN
  Administered 2016-03-25: 1 mg via INTRAVITREAL

## 2016-03-25 MED ORDER — PROPOFOL 10 MG/ML IV BOLUS
INTRAVENOUS | Status: AC
  Filled 2016-03-25: qty 20

## 2016-03-25 MED ORDER — TOBRAMYCIN-DEXAMETHASONE 0.3-0.1 % OP OINT
TOPICAL_OINTMENT | OPHTHALMIC | Status: AC
  Filled 2016-03-25: qty 3.5

## 2016-03-25 MED ORDER — TETRACAINE HCL 0.5 % OP SOLN
OPHTHALMIC | Status: AC
  Filled 2016-03-25: qty 2

## 2016-03-25 MED ORDER — DEXAMETHASONE SODIUM PHOSPHATE 10 MG/ML IJ SOLN
INTRAMUSCULAR | Status: AC
  Filled 2016-03-25: qty 1

## 2016-03-25 MED ORDER — PROPOFOL 10 MG/ML IV BOLUS
INTRAVENOUS | Status: DC | PRN
  Administered 2016-03-25: 50 mg via INTRAVENOUS
  Administered 2016-03-25: 40 mg via INTRAVENOUS

## 2016-03-25 MED ORDER — VANCOMYCIN INTRAVITREAL INJECTION 1 MG/0.1 ML
1.0000 mg | INTRAOCULAR | Status: DC
  Filled 2016-03-25: qty 0.1

## 2016-03-25 MED ORDER — PROMETHAZINE HCL 25 MG/ML IJ SOLN: 6.2500 mg | INTRAMUSCULAR | Status: DC | PRN

## 2016-03-25 MED ORDER — MIDAZOLAM HCL 5 MG/5ML IJ SOLN
INTRAMUSCULAR | Status: DC | PRN
  Administered 2016-03-25: 1 mg via INTRAVENOUS

## 2016-03-25 MED ORDER — DEXTROSE 50 % IV SOLN
12.5000 g | Freq: Once | INTRAVENOUS | Status: AC
  Administered 2016-03-25: 12.5 g via INTRAVENOUS

## 2016-03-25 MED ORDER — LACTATED RINGERS IV SOLN
INTRAVENOUS | Status: DC | PRN
  Administered 2016-03-25: 18:00:00 via INTRAVENOUS

## 2016-03-25 MED ORDER — HYPROMELLOSE (GONIOSCOPIC) 2.5 % OP SOLN
OPHTHALMIC | Status: DC | PRN
  Administered 2016-03-25: 2 [drp] via OPHTHALMIC

## 2016-03-25 MED ORDER — LIDOCAINE HCL 2 % IJ SOLN
INTRAMUSCULAR | Status: AC
  Filled 2016-03-25: qty 20

## 2016-03-25 MED ORDER — EPINEPHRINE HCL 1 MG/ML IJ SOLN
INTRAMUSCULAR | Status: AC
  Filled 2016-03-25: qty 1

## 2016-03-25 MED ORDER — FENTANYL CITRATE (PF) 100 MCG/2ML IJ SOLN
INTRAMUSCULAR | Status: DC | PRN
  Administered 2016-03-25: 25 ug via INTRAVENOUS

## 2016-03-25 MED ORDER — DEXAMETHASONE SODIUM PHOSPHATE 10 MG/ML IJ SOLN
INTRAMUSCULAR | Status: DC | PRN
  Administered 2016-03-25: 10 mg via INTRAVENOUS

## 2016-03-25 MED ORDER — DEXTROSE 5 % IV SOLN
1.0000 g | INTRAVENOUS | Status: DC
  Filled 2016-03-25: qty 1

## 2016-03-25 MED ORDER — SODIUM CHLORIDE 0.9 % IJ SOLN
INTRAMUSCULAR | Status: AC
  Filled 2016-03-25: qty 10

## 2016-03-25 MED ORDER — TOBRAMYCIN-DEXAMETHASONE 0.3-0.1 % OP OINT
TOPICAL_OINTMENT | OPHTHALMIC | Status: DC | PRN
  Administered 2016-03-25: 1 via OPHTHALMIC

## 2016-03-25 MED ORDER — FENTANYL CITRATE (PF) 100 MCG/2ML IJ SOLN: 25.0000 ug | INTRAMUSCULAR | Status: DC | PRN

## 2016-03-25 MED ORDER — GENTAMICIN SULFATE 40 MG/ML IJ SOLN
INTRAMUSCULAR | Status: AC
  Filled 2016-03-25: qty 2

## 2016-03-25 MED ORDER — BUPIVACAINE HCL (PF) 0.75 % IJ SOLN
INTRAMUSCULAR | Status: AC
  Filled 2016-03-25: qty 10

## 2016-03-25 MED ORDER — EPINEPHRINE HCL 1 MG/ML IJ SOLN
INTRAOCULAR | Status: DC | PRN
  Administered 2016-03-25: 500 mL

## 2016-03-25 MED ORDER — TROPICAMIDE 1 % OP SOLN
1.0000 [drp] | OPHTHALMIC | Status: AC
  Administered 2016-03-25 (×3): 1 [drp] via OPHTHALMIC
  Filled 2016-03-25: qty 3
  Filled 2016-03-25: qty 2

## 2016-03-25 MED ORDER — BUPIVACAINE HCL (PF) 0.75 % IJ SOLN
INTRAMUSCULAR | Status: DC | PRN
  Administered 2016-03-25: 6 mL via RETROBULBAR

## 2016-03-25 MED ORDER — DEXTROSE 50 % IV SOLN
INTRAVENOUS | Status: AC
  Filled 2016-03-25: qty 50

## 2016-03-25 MED ORDER — CEFTAZIDIME INTRAVITREAL INJECTION 2.25 MG/0.1 ML
INTRAVITREAL | Status: DC | PRN
  Administered 2016-03-25: 2.25 mg via INTRAVITREAL

## 2016-03-25 MED ORDER — SODIUM HYALURONATE 10 MG/ML IO SOLN
INTRAOCULAR | Status: AC
  Filled 2016-03-25: qty 0.85

## 2016-03-25 MED ORDER — BSS IO SOLN
INTRAOCULAR | Status: DC | PRN
Start: 2016-03-25 — End: 2016-03-25
  Administered 2016-03-25: 15 mL

## 2016-03-25 MED ORDER — FENTANYL CITRATE (PF) 250 MCG/5ML IJ SOLN
INTRAMUSCULAR | Status: AC
  Filled 2016-03-25: qty 5

## 2016-03-25 MED ORDER — HYALURONIDASE HUMAN 150 UNIT/ML IJ SOLN
INTRAMUSCULAR | Status: AC
  Filled 2016-03-25: qty 1

## 2016-03-25 MED ORDER — MIDAZOLAM HCL 2 MG/2ML IJ SOLN
INTRAMUSCULAR | Status: AC
  Filled 2016-03-25: qty 2

## 2016-03-25 MED ORDER — SODIUM CHLORIDE 0.9 % IV SOLN
INTRAVENOUS | Status: DC
  Administered 2016-03-25: 15:00:00 via INTRAVENOUS

## 2016-03-25 MED ORDER — VANCOMYCIN HCL IN DEXTROSE 1-5 GM/200ML-% IV SOLN
1000.0000 mg | INTRAVENOUS | Status: DC
  Filled 2016-03-25: qty 200

## 2016-03-25 MED ORDER — POLYMYXIN B SULFATE 500000 UNITS IJ SOLR
INTRAMUSCULAR | Status: AC
  Filled 2016-03-25: qty 1

## 2016-03-25 SURGICAL SUPPLY — 55 items
APPLICATOR COTTON TIP 6IN STRL (MISCELLANEOUS) ×2 IMPLANT
BLADE MVR KNIFE 20G (BLADE) IMPLANT
BLADE STAB KNIFE 15DEG (BLADE) ×2 IMPLANT
CANNULA ANT CHAM MAIN (OPHTHALMIC RELATED) IMPLANT
CANNULA DUAL BORE 23G (CANNULA) IMPLANT
CANNULA VLV SOFT TIP 25GA (OPHTHALMIC) ×2 IMPLANT
CAUTERY EYE LOW TEMP 1300F FIN (OPHTHALMIC RELATED) IMPLANT
CLSR STERI-STRIP ANTIMIC 1/2X4 (GAUZE/BANDAGES/DRESSINGS) ×2 IMPLANT
CORDS BIPOLAR (ELECTRODE) ×2 IMPLANT
COVER MAYO STAND STRL (DRAPES) ×2 IMPLANT
DRAPE INCISE 51X51 W/FILM STRL (DRAPES) ×2 IMPLANT
DRAPE PROXIMA HALF (DRAPES) ×2 IMPLANT
DRAPE RETRACTOR (MISCELLANEOUS) ×2 IMPLANT
DRAPE SURG 17X23 STRL (DRAPES) ×2 IMPLANT
ERASER HMR WETFIELD 23G BP (MISCELLANEOUS) IMPLANT
FORCEPS ECKARDT ILM 25G SERR (OPHTHALMIC RELATED) IMPLANT
FORCEPS GRIESHABER ILM 25G A (INSTRUMENTS) IMPLANT
GAS AUTO FILL CONSTEL (OPHTHALMIC)
GAS AUTO FILL CONSTELLATION (OPHTHALMIC) IMPLANT
GLOVE BIO SURGEON STRL SZ7.5 (GLOVE) ×2 IMPLANT
GLOVE BIOGEL PI IND STRL 7.5 (GLOVE) ×1 IMPLANT
GLOVE BIOGEL PI INDICATOR 7.5 (GLOVE) ×1
GOWN STRL REUS W/ TWL LRG LVL3 (GOWN DISPOSABLE) ×1 IMPLANT
GOWN STRL REUS W/TWL LRG LVL3 (GOWN DISPOSABLE) ×1
HANDLE PNEUMATIC FOR CONSTEL (OPHTHALMIC) IMPLANT
KIT BASIN OR (CUSTOM PROCEDURE TRAY) ×2 IMPLANT
KIT ROOM TURNOVER OR (KITS) ×2 IMPLANT
LENS BIOM SUPER VIEW SET DISP (OPHTHALMIC RELATED) ×2 IMPLANT
MICROPICK 25G (MISCELLANEOUS)
NEEDLE 18GX1X1/2 (RX/OR ONLY) (NEEDLE) ×4 IMPLANT
NEEDLE 25GX 5/8IN NON SAFETY (NEEDLE) ×2 IMPLANT
NEEDLE FILTER BLUNT 18X 1/2SAF (NEEDLE) ×1
NEEDLE FILTER BLUNT 18X1 1/2 (NEEDLE) ×1 IMPLANT
NEEDLE HYPO 25GX1X1/2 BEV (NEEDLE) IMPLANT
NEEDLE HYPO 30X.5 LL (NEEDLE) ×6 IMPLANT
NEEDLE RETROBULBAR 25GX1.5 (NEEDLE) ×2 IMPLANT
NS IRRIG 1000ML POUR BTL (IV SOLUTION) ×2 IMPLANT
PACK VITRECTOMY CUSTOM (CUSTOM PROCEDURE TRAY) ×2 IMPLANT
PAD ARMBOARD 7.5X6 YLW CONV (MISCELLANEOUS) ×4 IMPLANT
PAK PIK VITRECTOMY CVS 25GA (OPHTHALMIC) ×2 IMPLANT
PENCIL BIPOLAR 25GA STR DISP (OPHTHALMIC RELATED) IMPLANT
PICK MICROPICK 25G (MISCELLANEOUS) IMPLANT
PROBE LASER ILLUM FLEX CVD 25G (OPHTHALMIC) ×2 IMPLANT
SCRAPER DIAMOND 25GA (OPHTHALMIC RELATED) IMPLANT
STOCKINETTE IMPERVIOUS LG (DRAPES) ×4 IMPLANT
STOPCOCK 4 WAY LG BORE MALE ST (IV SETS) IMPLANT
SUT VICRYL 7 0 TG140 8 (SUTURE) ×2 IMPLANT
SWAB CULTURE ESWAB REG 1ML (MISCELLANEOUS) ×2 IMPLANT
SYR 20CC LL (SYRINGE) ×2 IMPLANT
SYR 5ML LL (SYRINGE) ×2 IMPLANT
SYR TB 1ML LUER SLIP (SYRINGE) ×2 IMPLANT
SYRINGE 10CC LL (SYRINGE) ×2 IMPLANT
TOWEL OR 17X24 6PK STRL BLUE (TOWEL DISPOSABLE) ×4 IMPLANT
WATER STERILE IRR 1000ML POUR (IV SOLUTION) ×2 IMPLANT
WIPE INSTRUMENT VISIWIPE 73X73 (MISCELLANEOUS) IMPLANT

## 2016-03-25 NOTE — Op Note (Signed)
Edward [name]    03/25/2016 Diagnosis: endopthalmitis and vitreous hemorrhage right eye   Procedure: [place] Vitrectomy, Endolaser and vitreous tap, injection of intravitreal antibiotics Operative Eye:  right eye  Surgeon: Royston Cowper Estimated Blood Loss: minimal Specimens for Pathology:  None Complications: none   The  patient was prepped and draped in the usual fashion for ocular surgery on the  right eye .  A lid speculum was placed.  Infusion line and trocar was placed at the 8 o'clock position approximately 3.5 mm from the surgical limbus.   The infusion line was allowed to run and then clamped when placed at the cannula opening. The line was inserted and secured to the drape with an adhesive strip.   Active trocars/cannula were placed at the 10 and 2 o'clock positions approximately 3.5 mm from the surgical limbus. The cannula was visualized in the vitreous cavity.  The light pipe and vitreous cutter were inserted into the vitreous cavity and a vitrectomy was performed with aspiration to a 5 cc syringe for culture.  Additional vitrectomy was used to remove hemorrhage.  Two sites of neovascularization were noted as well as severely sclerotic vessels nasally.  Endolaser was applied to the neovascular fronds.   Anterior chamber washout was performed.   A partial air/fluid exchange was performed.  1 mg in 0.1 ml of Vancomycin, 2.25 mg in 0.1 ml of ceftazadime, and 400 micrograms of decadron were injected in the vitreous space, the trocars were sequentially removed and the sclerotomies were noted to be airtight.     Care taken to remove the vitreous up to the vitreous base for 360 degrees.  Subconjunctival injections of  Vancomycin, Ceftazadime, and Decadron were placed.   The speculum and drapes were removed and the eye was patched with Polymixin/Bacitracin ophthalmic ointment. An eye shield was placed and the patient was transferred alert and conversant with stable vital  signs to the post operative recovery area.  The patient tolerated the procedure well and no complications were noted.  Royston Cowper MD

## 2016-03-25 NOTE — Anesthesia Preprocedure Evaluation (Addendum)
Anesthesia Evaluation  Patient identified by MRN, date of birth, ID band Patient awake    Reviewed: Allergy & Precautions, NPO status , Patient's Chart, lab work & pertinent test results  Airway Mallampati: I  TM Distance: >3 FB Neck ROM: Full    Dental  (+) Teeth Intact, Dental Advisory Given   Pulmonary former smoker,    breath sounds clear to auscultation       Cardiovascular hypertension, Pt. on medications  Rhythm:Regular Rate:Normal     Neuro/Psych negative neurological ROS     GI/Hepatic negative GI ROS, Neg liver ROS,   Endo/Other  diabetes, Type 2, Oral Hypoglycemic AgentsHyperthyroidism   Renal/GU CRFRenal disease     Musculoskeletal  (+) Arthritis ,   Abdominal   Peds  Hematology  (+) anemia ,   Anesthesia Other Findings   Reproductive/Obstetrics                          Lab Results  Component Value Date   WBC 6.5 03/21/2016   HGB 9.5* 03/21/2016   HCT 26.3* 03/21/2016   MCV 80.9 03/21/2016   PLT 273 03/21/2016   Lab Results  Component Value Date   CREATININE 1.99* 03/21/2016   BUN 48* 03/21/2016   NA 141 03/21/2016   K 4.9 03/21/2016   CL 110 03/21/2016   CO2 23 03/21/2016    Anesthesia Physical Anesthesia Plan  ASA: III  Anesthesia Plan: MAC   Post-op Pain Management:    Induction: Intravenous  Airway Management Planned: Simple Face Mask and Nasal Cannula  Additional Equipment:   Intra-op Plan:   Post-operative Plan:   Informed Consent: I have reviewed the patients History and Physical, chart, labs and discussed the procedure including the risks, benefits and alternatives for the proposed anesthesia with the patient or authorized representative who has indicated his/her understanding and acceptance.     Plan Discussed with: CRNA  Anesthesia Plan Comments:        Anesthesia Quick Evaluation

## 2016-03-25 NOTE — H&P (Signed)
  Date of examination:  03/25/16   Indication for surgery: Acute inflammation/endopthalmitis and vitreous hemorrhage right eye  Pertinent past medical history:  Past Medical History  Diagnosis Date  . Diabetes mellitus   . Hypertension   . Non-compliance   . Headache   . Anemia   . Chronic kidney disease   . Graves disease 2014  . Shortness of breath dyspnea   . Arthritis     Pertinent ocular history:  Severe proliferative diabetic retinopathy with recent tractional retinal detachment repair  Pertinent family history:  Family History  Problem Relation Age of Onset  . Hypertension Mother   . Diabetes Mother     General:  Healthy appearing patient in no distress.     Eyes:    Acuity OD HM     External: Ptosis OD  Anterior segment: 3-4+ cell OD, normal OS    Fundus: limited view   Heart: Regular rate and rhythm without murmur    Lungs: Clear to auscultation     Abdomen: Soft, nontender, normal bowel sounds     Impression:  Endopthalmitis right eye and vitreous hemorrhage right eye  Plan: PPVx with injection of intravitreal antibiotics  Royston Cowper

## 2016-03-25 NOTE — Anesthesia Postprocedure Evaluation (Signed)
Anesthesia Post Note  Patient: Edward [name]  Procedure(s) Performed: Procedure(s) (LRB): PARS PLANA VITRECTOMY 25 GAUGE FOR ENDOPHTHALMITIS (Right)  Patient location during evaluation: PACU Anesthesia Type: MAC Level of consciousness: awake and alert Pain management: pain level controlled Vital Signs Assessment: post-procedure vital signs reviewed and stable Respiratory status: spontaneous breathing, nonlabored ventilation and respiratory function stable Cardiovascular status: blood pressure returned to baseline Postop Assessment: no signs of nausea or vomiting Anesthetic complications: no    Last Vitals:  Filed Vitals:   03/25/16 1920 03/25/16 1934  BP: 152/80 155/86  Pulse: 87 79  Temp:  37 C  Resp: 18 20    Last Pain:  Filed Vitals:   03/25/16 1954  PainSc: 5                  [name],E. Elton Heid

## 2016-03-25 NOTE — Discharge Instructions (Signed)
Sleep on side, do not sleep on back

## 2016-03-25 NOTE — Transfer of Care (Signed)
Immediate Anesthesia Transfer of Care Note  Patient: Edward [name]  Procedure(s) Performed: Procedure(s): PARS PLANA VITRECTOMY 25 GAUGE FOR ENDOPHTHALMITIS (Right)  Patient Location: PACU  Anesthesia Type:MAC  Level of Consciousness: awake, alert  and oriented  Airway & Oxygen Therapy: Patient Spontanous Breathing and Patient connected to nasal cannula oxygen  Post-op Assessment: Report given to RN and Post -op Vital signs reviewed and stable  Post vital signs: Reviewed and stable  Last Vitals:  Filed Vitals:   03/25/16 1516  BP: 141/78  Pulse: 84  Temp: 37.2 C  Resp: 18    Last Pain:  Filed Vitals:   03/25/16 1523  PainSc: 7       Patients Stated Pain Goal: 3 (0000000 [id number])  Complications: No apparent anesthesia complications

## 2016-03-25 NOTE — Brief Op Note (Signed)
03/25/2016  6:58 PM  PATIENT:  Edward [name]  45 y.o. male  PRE-OPERATIVE DIAGNOSIS: endopthalmitis and vitreous hemorrhage right eye  POST-OPERATIVE DIAGNOSIS:  endopthalmitis and vitreous hemorrhage right eye  PROCEDURE:  Procedure(s): Pars plana vitrectomy, tap for culture, injection of intravitreal antibiotic and endolaser  (Right)  SURGEON:  Surgeon(s) and Role:    * Jalene Mullet, MD - Primary  PHYSICIAN ASSISTANT:   ASSISTANTS: none   ANESTHESIA:   local and MAC  EBL:     BLOOD ADMINISTERED:none  DRAINS: none   LOCAL MEDICATIONS USED:  MARCAINE    and LIDOCAINE   SPECIMEN:  Source of Specimen:  Vitreous for culture  DISPOSITION OF SPECIMEN:  microbiology  COUNTS:  YES  TOURNIQUET:  * No tourniquets in log *  DICTATION: .Note written in EPIC  PLAN OF CARE: Discharge to home after PACU  PATIENT DISPOSITION:  PACU - hemodynamically stable.   Delay start of Pharmacological VTE agent (>24hrs) due to surgical blood loss or risk of bleeding: not applicable

## 2016-03-26 NOTE — Addendum Note (Signed)
Addended byLottie Mussel T on: 03/26/2016 07:05 AM   Modules accepted: Orders, Medications

## 2016-03-28 ENCOUNTER — Other Ambulatory Visit: Payer: Self-pay | Admitting: Internal Medicine

## 2016-03-28 ENCOUNTER — Encounter (HOSPITAL_COMMUNITY): Payer: Self-pay | Admitting: Ophthalmology

## 2016-03-28 ENCOUNTER — Telehealth: Payer: Self-pay | Admitting: Internal Medicine

## 2016-03-28 DIAGNOSIS — Z125 Encounter for screening for malignant neoplasm of prostate: Secondary | ICD-10-CM

## 2016-03-28 MED ORDER — TRAMADOL HCL 50 MG PO TABS: 50.0000 mg | ORAL_TABLET | Freq: Three times a day (TID) | ORAL | Status: DC | PRN

## 2016-03-28 MED ORDER — CYCLOBENZAPRINE HCL 5 MG PO TABS: 5.0000 mg | ORAL_TABLET | Freq: Three times a day (TID) | ORAL | Status: DC | PRN

## 2016-03-28 NOTE — Telephone Encounter (Signed)
Called and talked to pt. Confirmed id w/ dob.  His insurance declined volt. Gel, still in signif pain. Will try ultram and flexeril. Avoid nsaids due to ckd.  Insurance will not cover any ED meds, but ?bph, +increase urination. Asked him to make f/u appt w/ me next month.  Doing well after eye surgeries, has fu appt tomorw.  To do labs when come to pick up meds.

## 2016-03-30 ENCOUNTER — Telehealth: Payer: Self-pay | Admitting: Internal Medicine

## 2016-03-30 LAB — AEROBIC/ANAEROBIC CULTURE (SURGICAL/DEEP WOUND)
CULTURE: NO GROWTH
GRAM STAIN: NONE SEEN

## 2016-03-30 NOTE — Telephone Encounter (Signed)
430pm; Called yesterday to talk to insurance benefits, due to denial of MRI,   call [phone] patient name , dob and Insurance ID [id number]   - calling again 03/30/16 now, talking w/ Charlena Cross,  - waiting for peer-peer talk  , Christoper Fabian, rn  -- talk to doc, left message about worrisome evam of urinary incontinence, now in appeals process.

## 2016-03-31 ENCOUNTER — Telehealth: Payer: Self-pay | Admitting: Internal Medicine

## 2016-03-31 NOTE — Telephone Encounter (Signed)
Patient called stating that he is scheduled to have an MRI but the patient does not have insurance so [facility] is going to cancel his appointment but he would like to know other options to be able to have the MRI.   Please f/u with pt

## 2016-04-01 ENCOUNTER — Ambulatory Visit (HOSPITAL_COMMUNITY): Payer: BLUE CROSS/BLUE SHIELD

## 2016-04-01 ENCOUNTER — Ambulatory Visit (HOSPITAL_COMMUNITY): Admission: RE | Admit: 2016-04-01 | Payer: BLUE CROSS/BLUE SHIELD | Source: Ambulatory Visit

## 2016-04-05 NOTE — Telephone Encounter (Signed)
Please call pt// The MRI are being reviewed by his insurance via appeals process now. I spent over 1 hr on phone w/ the insurance ppl last week, hopefully will here if they will approve for some if not all of the testing soon.

## 2016-04-06 ENCOUNTER — Telehealth: Payer: Self-pay | Admitting: Internal Medicine

## 2016-04-06 DIAGNOSIS — M5126 Other intervertebral disc displacement, lumbar region: Secondary | ICD-10-CM

## 2016-04-06 NOTE — Telephone Encounter (Signed)
Called pt today, confirmed ID w/ dob. dw him options for mri, since it was declined on review process as well (confirmed w my referral speciallist today)  Pain quite severe, and he cannot tolerate it anymore. Will go ahead w/ ortho cs, and defer further dx/treatment options.   Ultram rx is in pharmacy, pt can pick up.

## 2016-04-07 MED FILL — CYCLOBENZAPRINE 5 MG TABLET: 5 | 15 days supply | Qty: 45 | Fill #0

## 2016-04-07 MED FILL — NAPROXEN 500 MG TABLET: 500 | 30 days supply | Qty: 60 | Fill #0

## 2016-04-07 MED FILL — AMLODIPINE BESYLATE 10 MG T: 10 | 30 days supply | Qty: 30 | Fill #2

## 2016-04-07 MED FILL — GLIMEPIRIDE 4 MG TABLET: 4 | 30 days supply | Qty: 30 | Fill #2

## 2016-04-07 MED FILL — traMADol HCL 50 MG TABS: 50 | 20 days supply | Qty: 45 | Fill #0

## 2016-04-08 ENCOUNTER — Telehealth: Payer: Self-pay | Admitting: Internal Medicine

## 2016-04-08 NOTE — Telephone Encounter (Signed)
Clld pt - Panacea re the MRI status.

## 2016-04-08 NOTE — Telephone Encounter (Signed)
Pt. Returned call. Please f/u °

## 2016-04-08 NOTE — Telephone Encounter (Signed)
Refer to 03/31/16 telephone note.

## 2016-04-08 NOTE — Telephone Encounter (Signed)
Returned  Pt cll - advsd of status of MRI. Advsd that once a decision has been made he will be contacted. Pt stated he understood and appreciated it.

## 2016-04-11 ENCOUNTER — Other Ambulatory Visit (INDEPENDENT_AMBULATORY_CARE_PROVIDER_SITE_OTHER): Payer: BLUE CROSS/BLUE SHIELD

## 2016-04-11 DIAGNOSIS — E0590 Thyrotoxicosis, unspecified without thyrotoxic crisis or storm: Secondary | ICD-10-CM

## 2016-04-11 LAB — TSH: TSH: 0.05 u[IU]/mL — AB (ref 0.35–4.50)

## 2016-04-11 LAB — T4, FREE: FREE T4: 1.14 ng/dL (ref 0.60–1.60)

## 2016-04-11 LAB — T3, FREE: T3, Free: 3.5 pg/mL (ref 2.3–4.2)

## 2016-04-20 NOTE — Telephone Encounter (Signed)
Pt. Called stating that he had concerns about a medication he is taking.  Pt. Is in pain. Please f/u with pt.

## 2016-05-02 NOTE — Telephone Encounter (Signed)
Medical Assistant left message on patient's home and cell voicemail. Voicemail states to give a call back to Nubia with [facility] at [phone].  

## 2016-05-09 ENCOUNTER — Telehealth: Payer: Self-pay | Admitting: Internal Medicine

## 2016-05-09 NOTE — Telephone Encounter (Signed)
Pt returning call from nurse on 7/31

## 2016-05-17 NOTE — Telephone Encounter (Signed)
Patient verified DOB Patient is complaining of excruciating pain. Patient was seen by ortho surgeon and needs to be advised on the next step. MA informed patient of concern being routed to PCP

## 2016-05-23 NOTE — Telephone Encounter (Signed)
Pt calling requesting to speak to nurse  Pt went to the Orthopedics and would like to know the results and what needs to be done next  Please assist

## 2016-05-23 NOTE — Telephone Encounter (Signed)
Please call him to make appt to come in and we can go over his options. Thanks.

## 2016-05-23 NOTE — Telephone Encounter (Signed)
Will forward to pcp

## 2016-05-24 ENCOUNTER — Telehealth: Payer: Self-pay | Admitting: Internal Medicine

## 2016-05-24 ENCOUNTER — Telehealth: Payer: Self-pay

## 2016-05-24 NOTE — Telephone Encounter (Signed)
Please try to continue the [place] for now (it would be very unusual to cause him pain) and I will recheck her thyroid tests when he comes back in 3 days and we will discuss and possibly decrease the dose.

## 2016-05-24 NOTE — Telephone Encounter (Signed)
Patient is having a lot of pain thinking it is coming from medication. methimazole (TAPAZOLE) 5 MG tablet

## 2016-05-24 NOTE — Telephone Encounter (Signed)
Could you call pt to make an appointment please and thank you

## 2016-05-24 NOTE — Telephone Encounter (Signed)
Called patient and notified of what Dr.Gherghe said about the medication. Reminded patient of appointment, he had no other questions right now.

## 2016-05-27 ENCOUNTER — Encounter: Payer: Self-pay | Admitting: Internal Medicine

## 2016-05-27 ENCOUNTER — Ambulatory Visit (INDEPENDENT_AMBULATORY_CARE_PROVIDER_SITE_OTHER): Payer: BLUE CROSS/BLUE SHIELD | Admitting: Internal Medicine

## 2016-05-27 VITALS — BP 138/78 | HR 78 | Ht 71.0 in | Wt 210.0 lb

## 2016-05-27 DIAGNOSIS — E0500 Thyrotoxicosis with diffuse goiter without thyrotoxic crisis or storm: Secondary | ICD-10-CM

## 2016-05-27 NOTE — Patient Instructions (Signed)
Please continue Methimazole 5 mg 2x a day.  Come back for labs on Tuesday.  Please come back for a follow-up appointment in 4 months.

## 2016-05-27 NOTE — Progress Notes (Addendum)
Patient ID: AVORY MIMBS, male   DOB: 02/24/1971, 45 y.o.   MRN: [id number]   HPI  ETHANIEL GARFIELD is a 45 y.o.-year-old male, initially referred by his PCP, Dr. Feliciana Rossetti, returning for f/u for Graves ds. Last visit 3 mo ago.  He has pain that has been going on for ~1 year >> shoulders and upper back >> down arms  >> arm moving uncontrollably. Also pain in hands. He saw neurology.  I reviewed pt's thyroid tests: Lab Results  Component Value Date   TSH 0.05 (L) 04/11/2016   TSH 0.07 (L) 02/26/2016   TSH 0.01 (L) 01/08/2016   TSH 0.018 (L) 11/25/2015   TSH <0.008 (L) 05/29/2015   TSH <0.008 (L) 06/21/2013   FREET4 1.14 04/11/2016   FREET4 1.97 (H) 02/26/2016   FREET4 2.1 (H) 01/08/2016   FREET4 1.27 05/29/2015    Component     Latest Ref Rng 02/26/2016  TSI     <140 % baseline 463 (H)  Elevated Graves antibodies + overt thyrotoxicosis >> dx of Graves' disease.   We started methimazole at 5 mg twice a day. He continues this >> tests improving.  Pt denies feeling nodules in neck, hoarseness, dysphagia/odynophagia, + occasional SOB with lying down; he mentions: - no weight loss - + fatigue - + excessive sweating/heat intolerance - + tremors - no anxiety - + insomnia - + palpitations - no hyperdefecation  Pt does not have a FH of thyroid ds. No FH of thyroid cancer. No h/o radiation tx to head or neck.  No seaweed or kelp, no recent contrast studies. No steroid use. No herbal supplements. No Biotin use.   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 He just had surgery in R eye, previously on the L eye - OU DR.  ROS: Constitutional: + see HPI, + excessive urination, + nocturia Eyes: + blurry vision, no xerophthalmia ENT: no sore throat, no nodules palpated in throat, no dysphagia/odynophagia, no hoarseness Cardiovascular: + CP/+ palpitations/+ leg swelling Respiratory: no cough/+  SOB Gastrointestinal: + N/+ V/no D/+ C Musculoskeletal: + both: muscle/joint aches Skin: no rashes, + hair loss Neurological: no tremors/numbness/tingling/dizziness, + HA + diff with erections  I reviewed pt's medications, allergies, PMH, social hx, family hx, and changes were documented in the history of present illness. Otherwise, unchanged from my initial visit note.  Past Medical History:  Diagnosis Date  . Anemia   . Arthritis   . Chronic kidney disease   . Diabetes mellitus   . Graves disease 2014  . Headache   . Hypertension   . Non-compliance   . Shortness of breath dyspnea    Past Surgical History:  Procedure Laterality Date  . CIRCUMCISION     as a child, around 45 years of age  . EYE SURGERY    . PARS PLANA VITRECTOMY Right 03/25/2016   Procedure: PARS PLANA VITRECTOMY 25 GAUGE FOR ENDOPHTHALMITIS;  Surgeon: Jalene Mullet, MD;  Location: [facility];  Service: Ophthalmology;  Laterality: Right;  . WISDOM TOOTH EXTRACTION     Social History   Social History  . Marital Status: Married    Spouse Name: N/A  . Number of Children: 5: 45-4 y/o (2017)  . Years of Education: N/A   Occupational History  . Chef-cook   Social History Main Topics  . Smoking status: Former Smoker -- 0.75 packs/day for 3 years  Types: Cigarettes    Quit date: 2004  . Smokeless tobacco: Not on file  . Alcohol Use:      Comment: 1 beer every night  . Drug Use: No   Current Outpatient Prescriptions on File Prior to Visit  Medication Sig Dispense Refill  . acetaminophen (TYLENOL) 325 MG tablet Take 650 mg by mouth every 6 (six) hours as needed for mild pain.    Marland Kitchen amLODipine (NORVASC) 10 MG tablet Take 1 tablet (10 mg total) by mouth daily. 30 tablet 2  . Blood Glucose Monitoring Suppl (TRUE METRIX METER) W/DEVICE KIT Check blood sugar before meals and at bedtime 1 kit 0  . cyclobenzaprine (FLEXERIL) 5 MG tablet Take 1 tablet (5 mg total) by mouth 3 (three) times daily as needed for muscle  spasms. 45 tablet 0  . glimepiride (AMARYL) 4 MG tablet Take 1 tablet (4 mg total) by mouth daily with breakfast. 30 tablet 4  . glucose blood (TRUE METRIX BLOOD GLUCOSE TEST) test strip Use as instructed 100 each 12  . methimazole (TAPAZOLE) 5 MG tablet Take 1 tablet (5 mg total) by mouth 3 (three) times daily. (Patient taking differently: Take 5 mg by mouth 2 (two) times daily. ) 90 tablet 1  . ofloxacin (OCUFLOX) 0.3 % ophthalmic solution Place 1 drop into the right eye 4 (four) times daily.  0  . traMADol (ULTRAM) 50 MG tablet Take 1 tablet (50 mg total) by mouth every 8 (eight) hours as needed for severe pain. 45 tablet 0  . TRUEPLUS LANCETS 28G MISC Check blood sugar before meals and at bedtime 100 each 11  . sildenafil (VIAGRA) 25 MG tablet Take 1 tablet (25 mg total) by mouth daily as needed for erectile dysfunction. If no effect, may double dose next day/time (Patient not taking: Reported on 03/25/2016) 10 tablet 0  . [DISCONTINUED] gabapentin (NEURONTIN) 100 MG capsule Take 1 capsule (100 mg total) by mouth 3 (three) times daily. 60 capsule 0   No current facility-administered medications on file prior to visit.    No Known Allergies Family History  Problem Relation Age of Onset  . Hypertension Mother   . Diabetes Mother    PE: BP 138/78 (BP Location: Left Arm, Patient Position: Sitting)   Pulse 78   Ht 5' 11"  (1.803 m)   Wt 210 lb (95.3 kg)   SpO2 98%   BMI 29.29 kg/m  Wt Readings from Last 3 Encounters:  05/27/16 210 lb (95.3 kg)  03/25/16 208 lb (94.3 kg)  03/21/16 208 lb (94.3 kg)   Constitutional: overweight, in NAD Eyes: PERRLA, EOMI, no exophthalmos - palpebral swelling B - but recent eye surgery, no lid lag, no stare ENT: moist mucous membranes, no thyromegaly - But left lobe slightly more prominent, no thyroid bruits, no cervical lymphadenopathy Cardiovascular: RRR, No MRG Respiratory: CTA B Gastrointestinal: abdomen soft, NT, ND, BS+ Musculoskeletal: no  deformities, strength intact in all 4 Skin: moist, warm, no rashes Neurological: no tremor with outstretched hands, DTR normal in all 4  ASSESSMENT: 1. 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 Patient with a low TSH dating since at least 2014, with thyrotoxic sxs: weight loss, heat intolerance, occasional palpitations, fatigue, generalized pain, insomnia. At last visit, we dx'ed Graves ds based on thyrotoxicosis timeline and elevated TSIs.  - we started MMI at last visit >> he responded well to this >> continues MMI 5 mg bid. Last TFTs were improved.  - will  check TSH, fT3 and fT4 in 4 days (no lab today, he prefers to come back here for labs) - If the tests are improved >> may decrease MMI. He has upper back pain  - he was initially concerned that this is related to the [place] but realizes now that they are not related. I agree. - we discussed about possible modalities of treatment for the above conditions, to include methimazole use, radioactive iodine ablation or (last resort) surgery. He agrees to continue [place] for now - I do not feel that we need to add beta blockers at this time, since he is not tachycardic, anxious, or tremulous - RTC in 4 months  Orders Placed This Encounter  Procedures  . T4, free  . T3, free  . TSH   Component     Latest Ref Rng & Units 05/31/2016  TSH     0.35 - 4.50 uIU/mL 0.03 (L)  T4,Free(Direct)     0.60 - 1.60 ng/dL 1.24  Triiodothyronine,Free,Serum     2.3 - 4.2 pg/mL 3.2   TSH is still low, and since he is still symptomatic, I will increase his methimazole to 5 mg 3 times a day and recheck tests in 6 weeks.  Philemon Kingdom, MD PhD [facility]

## 2016-05-30 ENCOUNTER — Telehealth: Payer: Self-pay | Admitting: Internal Medicine

## 2016-05-30 NOTE — Telephone Encounter (Signed)
Pt calling requesting to talk to nurse to follow up about his pain management and what the next steps will be

## 2016-05-31 ENCOUNTER — Other Ambulatory Visit (INDEPENDENT_AMBULATORY_CARE_PROVIDER_SITE_OTHER): Payer: BLUE CROSS/BLUE SHIELD

## 2016-05-31 DIAGNOSIS — E0500 Thyrotoxicosis with diffuse goiter without thyrotoxic crisis or storm: Secondary | ICD-10-CM | POA: Diagnosis not present

## 2016-05-31 LAB — TSH: TSH: 0.03 u[IU]/mL — AB (ref 0.35–4.50)

## 2016-05-31 LAB — T3, FREE: T3, Free: 3.2 pg/mL (ref 2.3–4.2)

## 2016-05-31 LAB — T4, FREE: Free T4: 1.24 ng/dL (ref 0.60–1.60)

## 2016-05-31 MED ORDER — METHIMAZOLE 5 MG PO TABS: 5.0000 mg | ORAL_TABLET | Freq: Three times a day (TID) | ORAL | 1 refills | Status: DC

## 2016-05-31 NOTE — Addendum Note (Signed)
Addended by: Philemon Kingdom on: 05/31/2016 05:23 PM   Modules accepted: Orders

## 2016-06-06 ENCOUNTER — Encounter (HOSPITAL_COMMUNITY): Payer: Self-pay | Admitting: Student-PharmD

## 2016-06-20 ENCOUNTER — Emergency Department (HOSPITAL_COMMUNITY)
Admission: EM | Admit: 2016-06-20 | Discharge: 2016-06-20 | Disposition: A | Discharge status: Home / Self Care | Payer: BLUE CROSS/BLUE SHIELD | Attending: Emergency Medicine | Admitting: Emergency Medicine

## 2016-06-20 ENCOUNTER — Encounter (HOSPITAL_COMMUNITY): Payer: Self-pay | Admitting: Emergency Medicine

## 2016-06-20 DIAGNOSIS — I129 Hypertensive chronic kidney disease with stage 1 through stage 4 chronic kidney disease, or unspecified chronic kidney disease: Secondary | ICD-10-CM | POA: Insufficient documentation

## 2016-06-20 DIAGNOSIS — Z7984 Long term (current) use of oral hypoglycemic drugs: Secondary | ICD-10-CM | POA: Insufficient documentation

## 2016-06-20 DIAGNOSIS — N189 Chronic kidney disease, unspecified: Secondary | ICD-10-CM | POA: Insufficient documentation

## 2016-06-20 DIAGNOSIS — M545 Low back pain: Secondary | ICD-10-CM | POA: Insufficient documentation

## 2016-06-20 DIAGNOSIS — E1122 Type 2 diabetes mellitus with diabetic chronic kidney disease: Secondary | ICD-10-CM | POA: Insufficient documentation

## 2016-06-20 DIAGNOSIS — Z87891 Personal history of nicotine dependence: Secondary | ICD-10-CM | POA: Insufficient documentation

## 2016-06-20 DIAGNOSIS — M25512 Pain in left shoulder: Secondary | ICD-10-CM | POA: Insufficient documentation

## 2016-06-20 DIAGNOSIS — G8929 Other chronic pain: Secondary | ICD-10-CM

## 2016-06-20 DIAGNOSIS — M25511 Pain in right shoulder: Secondary | ICD-10-CM | POA: Insufficient documentation

## 2016-06-20 MED ORDER — HYDROCODONE-ACETAMINOPHEN 5-325 MG PO TABS: 1.0000 | ORAL_TABLET | ORAL | 0 refills | Status: DC | PRN

## 2016-06-20 NOTE — ED Provider Notes (Signed)
[place] DEPT Provider Note   CSN: [id number] Arrival date & time: 06/20/16  0820    By signing my name below, I, Sonum Patel, attest that this documentation has been prepared under the direction and in the presence of [employer], PA-C. Electronically Signed: Sonum Patel, Scribe. 06/20/16. 9:41 AM.  History   Chief Complaint Chief Complaint  Patient presents with  . Back Pain  . Shoulder Pain    The history is provided by the patient. No language interpreter was used.     HPI Comments: Edward [name] is a 45 y.o. male with past medical history of arthritis who presents to the Emergency Department complaining of 1 year of bilateral shoulder pain that has gradually worsened. He also complains of lower back pain which he states is related to a herniated disc. He states the bilateral shoulder pain is worsened with lifting BUE and certain movements; states warm showers sometimes ease the pain. He states his PCP has prescribed Tramadol and muscle relaxants without relief. He has been seen by an orthopedist; states he has not been able to follow up with his PCP. He has history of kidney disease so he has not been taking anti-inflammatory medication. He denies known trauma or injuries. He denies swelling.    Past Medical History:  Diagnosis Date  . Anemia   . Arthritis   . Chronic kidney disease   . Diabetes mellitus   . Graves disease 2014  . Headache   . Hypertension   . Non-compliance   . Shortness of breath dyspnea     Patient Active Problem List   Diagnosis Date Noted  . Graves disease 03/02/2016  . Non compliance with medical treatment 11/26/2015  . Uncontrolled hypertension 11/26/2015  . Acute kidney injury superimposed on chronic kidney disease ([place]) 11/25/2015  . Chest pain at rest 11/25/2015  . Hyperkalemia 11/25/2015  . Diabetes mellitus type 2, uncontrolled, with complications ([place]) 78/29/5621    Past Surgical History:  Procedure Laterality Date  .  CIRCUMCISION     as a child, around 29 years of age  . EYE SURGERY    . PARS PLANA VITRECTOMY Right 03/25/2016   Procedure: PARS PLANA VITRECTOMY 25 GAUGE FOR ENDOPHTHALMITIS;  Surgeon: Jalene Mullet, MD;  Location: [facility];  Service: Ophthalmology;  Laterality: Right;  . WISDOM TOOTH EXTRACTION         Home Medications    Prior to Admission medications   Medication Sig Start Date End Date Taking? Authorizing Provider  acetaminophen (TYLENOL) 325 MG tablet Take 650 mg by mouth every 6 (six) hours as needed for mild pain.    Historical Provider, MD  amLODipine (NORVASC) 10 MG tablet Take 1 tablet (10 mg total) by mouth daily. 01/08/16   Lance Bosch, NP  Blood Glucose Monitoring Suppl (TRUE METRIX METER) W/DEVICE KIT Check blood sugar before meals and at bedtime 05/29/15   Lance Bosch, NP  cyclobenzaprine (FLEXERIL) 5 MG tablet Take 1 tablet (5 mg total) by mouth 3 (three) times daily as needed for muscle spasms. 03/28/16   Maren Reamer, MD  glimepiride (AMARYL) 4 MG tablet Take 1 tablet (4 mg total) by mouth daily with breakfast. 12/08/15   Lance Bosch, NP  glucose blood (TRUE METRIX BLOOD GLUCOSE TEST) test strip Use as instructed 05/29/15   Lance Bosch, NP  HYDROcodone-acetaminophen (NORCO/VICODIN) 5-325 MG tablet Take 1-2 tablets by mouth every 4 (four) hours as needed. 06/20/16   Recardo Evangelist, PA-C  methimazole (TAPAZOLE) 5 MG tablet Take 1 tablet (5 mg total) by mouth 3 (three) times daily. 05/31/16   Philemon Kingdom, MD  ofloxacin (OCUFLOX) 0.3 % ophthalmic solution Place 1 drop into the right eye 4 (four) times daily. 03/22/16   Historical Provider, MD  sildenafil (VIAGRA) 25 MG tablet Take 1 tablet (25 mg total) by mouth daily as needed for erectile dysfunction. If no effect, may double dose next day/time Patient not taking: Reported on 03/25/2016 03/21/16   Maren Reamer, MD  traMADol (ULTRAM) 50 MG tablet Take 1 tablet (50 mg total) by mouth every 8 (eight) hours as  needed for severe pain. 03/28/16   Maren Reamer, MD  TRUEPLUS LANCETS 28G MISC Check blood sugar before meals and at bedtime 05/29/15   Lance Bosch, NP    Family History Family History  Problem Relation Age of Onset  . Hypertension Mother   . Diabetes Mother     Social History Social History  Substance Use Topics  . Smoking status: Former Smoker    Packs/day: 0.75    Years: 3.00    Types: Cigarettes    Quit date: 05/28/2000  . Smokeless tobacco: Never Used  . Alcohol use 0.0 oz/week     Comment: 1 beer every night     Allergies   Review of patient's allergies indicates no known allergies.   Review of Systems Review of Systems  Musculoskeletal: Positive for arthralgias, back pain and myalgias. Negative for joint swelling.  Neurological: Negative for weakness and numbness.     Physical Exam Updated Vital Signs BP 149/89 (BP Location: Left Arm)   Pulse 79   Temp 98.5 F (36.9 C) (Oral)   Resp 20   Ht 6' (1.829 m)   Wt 210 lb (95.3 kg)   SpO2 100%   BMI 28.48 kg/m   Physical Exam  Constitutional: He is oriented to person, place, and time. He appears well-developed and well-nourished.  HENT:  Head: Normocephalic and atraumatic.  Neck: Normal range of motion. Neck supple.  No midline tenderness. Tenderness of bilateral SCM  Cardiovascular: Normal rate.   Pulmonary/Chest: Effort normal.  Musculoskeletal:  Right and Left shoulder: No obvious swelling or deformity. Diffuse tenderness to palpation. FROM however pain is elicited with ROM. N/V intact.  Back: Inspection: No masses, deformity, or rash Palpation: No midline spinal tenderness. No paraspinal muscle tenderness. ROM: Normal flexion, extension, lateral rotation and flexion of back.  Strength: 5/5 in lower extremities and normal plantar and dorsiflexion Sensation: Intact sensation with light touch in lower extremities bilaterally Gait: Normal gait Reflexes: Patellar reflex is 2+ bilaterally, Achilles  is 2+ bilaterally SLR: Negative seated straight leg raise   Neurological: He is alert and oriented to person, place, and time.  Skin: Skin is warm and dry.  Psychiatric: He has a normal mood and affect.  Nursing note and vitals reviewed.    ED Treatments / Results  DIAGNOSTIC STUDIES: Oxygen Saturation is 100% on RA, normal by my interpretation.    COORDINATION OF CARE: 9:42 AM Discussed treatment plan with pt at bedside and pt agreed to plan.    Labs (all labs ordered are listed, but only abnormal results are displayed) Labs Reviewed - No data to display  EKG  EKG Interpretation None       Radiology No results found.  Procedures Procedures (including critical care time)  Medications Ordered in ED Medications - No data to display   Initial Impression / Assessment and Plan /  ED Course  I have reviewed the triage vital signs and the nursing notes.  Pertinent labs & imaging results that were available during my care of the patient were reviewed by me and considered in my medical decision making (see chart for details).  Clinical Course   Patient with back pain and chronic MSK complaints.  No neurological deficits and normal neuro exam.  Patient is ambulatory.  No loss of bowel or bladder control.  No concern for cauda equina.  No fever, night sweats, weight loss, h/o cancer, IVDA, no recent procedure to back. No urinary symptoms suggestive of UTI. Will treat with pain medicine rx. Supportive care and return precaution discussed. Appears safe for discharge at this time. Follow up as indicated in discharge paperwork.   Final Clinical Impressions(s) / ED Diagnoses   Final diagnoses:  Bilateral shoulder pain  Chronic low back pain    New Prescriptions Discharge Medication List as of 06/20/2016  9:47 AM    START taking these medications   Details  HYDROcodone-acetaminophen (NORCO/VICODIN) 5-325 MG tablet Take 1-2 tablets by mouth every 4 (four) hours as needed.,  Starting Mon 06/20/2016, Print        I personally performed the services described in this documentation, which was scribed in my presence. The recorded information has been reviewed and is accurate.    Recardo Evangelist, PA-C 07/01/16 0109    Carmin Muskrat, MD 07/01/16 [phone]

## 2016-06-20 NOTE — ED Triage Notes (Signed)
States both shoulders hurt "I think it is my rotator cuffs" -- no injury-- has been hurting for awhile. Also c/o chronic back pain

## 2016-09-19 ENCOUNTER — Encounter: Payer: Self-pay | Admitting: Internal Medicine

## 2016-09-19 ENCOUNTER — Ambulatory Visit: Payer: Medicaid Other | Attending: Internal Medicine | Admitting: Internal Medicine

## 2016-09-19 VITALS — BP 164/101 | HR 79 | Temp 98.1°F | Resp 16 | Wt 221.2 lb

## 2016-09-19 DIAGNOSIS — Z23 Encounter for immunization: Secondary | ICD-10-CM

## 2016-09-19 DIAGNOSIS — Z114 Encounter for screening for human immunodeficiency virus [HIV]: Secondary | ICD-10-CM

## 2016-09-19 DIAGNOSIS — E0500 Thyrotoxicosis with diffuse goiter without thyrotoxic crisis or storm: Secondary | ICD-10-CM | POA: Diagnosis not present

## 2016-09-19 DIAGNOSIS — G894 Chronic pain syndrome: Secondary | ICD-10-CM | POA: Diagnosis not present

## 2016-09-19 DIAGNOSIS — M25519 Pain in unspecified shoulder: Secondary | ICD-10-CM | POA: Diagnosis not present

## 2016-09-19 DIAGNOSIS — N183 Chronic kidney disease, stage 3 (moderate): Secondary | ICD-10-CM

## 2016-09-19 DIAGNOSIS — Z794 Long term (current) use of insulin: Secondary | ICD-10-CM | POA: Diagnosis not present

## 2016-09-19 DIAGNOSIS — Z79899 Other long term (current) drug therapy: Secondary | ICD-10-CM | POA: Insufficient documentation

## 2016-09-19 DIAGNOSIS — E11319 Type 2 diabetes mellitus with unspecified diabetic retinopathy without macular edema: Secondary | ICD-10-CM | POA: Diagnosis not present

## 2016-09-19 DIAGNOSIS — E1122 Type 2 diabetes mellitus with diabetic chronic kidney disease: Secondary | ICD-10-CM | POA: Diagnosis present

## 2016-09-19 DIAGNOSIS — E1165 Type 2 diabetes mellitus with hyperglycemia: Secondary | ICD-10-CM | POA: Insufficient documentation

## 2016-09-19 DIAGNOSIS — Z9114 Patient's other noncompliance with medication regimen: Secondary | ICD-10-CM | POA: Diagnosis not present

## 2016-09-19 DIAGNOSIS — M5126 Other intervertebral disc displacement, lumbar region: Secondary | ICD-10-CM | POA: Diagnosis not present

## 2016-09-19 DIAGNOSIS — M549 Dorsalgia, unspecified: Secondary | ICD-10-CM | POA: Diagnosis not present

## 2016-09-19 DIAGNOSIS — D649 Anemia, unspecified: Secondary | ICD-10-CM | POA: Diagnosis not present

## 2016-09-19 DIAGNOSIS — E113593 Type 2 diabetes mellitus with proliferative diabetic retinopathy without macular edema, bilateral: Secondary | ICD-10-CM | POA: Diagnosis not present

## 2016-09-19 DIAGNOSIS — I10 Essential (primary) hypertension: Secondary | ICD-10-CM

## 2016-09-19 LAB — CBC WITH DIFFERENTIAL/PLATELET
BASOS ABS: 0 {cells}/uL (ref 0–200)
Basophils Relative: 0 %
EOS PCT: 1 %
Eosinophils Absolute: 71 cells/uL (ref 15–500)
HCT: 31.2 % — ABNORMAL LOW (ref 38.5–50.0)
HEMOGLOBIN: 10.9 g/dL — AB (ref 13.2–17.1)
LYMPHS ABS: 2130 {cells}/uL (ref 850–3900)
LYMPHS PCT: 30 %
MCH: 29.8 pg (ref 27.0–33.0)
MCHC: 34.9 g/dL (ref 32.0–36.0)
MCV: 85.2 fL (ref 80.0–100.0)
MONOS PCT: 6 %
MPV: 8.9 fL (ref 7.5–12.5)
Monocytes Absolute: 426 cells/uL (ref 200–950)
NEUTROS PCT: 63 %
Neutro Abs: 4473 cells/uL (ref 1500–7800)
Platelets: 294 10*3/uL (ref 140–400)
RBC: 3.66 MIL/uL — ABNORMAL LOW (ref 4.20–5.80)
RDW: 13.1 % (ref 11.0–15.0)
WBC: 7.1 10*3/uL (ref 3.8–10.8)

## 2016-09-19 LAB — LIPID PANEL
CHOL/HDL RATIO: 2.8 ratio (ref <5.0)
Cholesterol: 193 mg/dL (ref <200)
HDL: 68 mg/dL (ref >40)
LDL CALC: 88 mg/dL (ref <100)
TRIGLYCERIDES: 187 mg/dL — AB (ref <150)
VLDL: 37 mg/dL — AB (ref <30)

## 2016-09-19 LAB — IRON,TIBC AND FERRITIN PANEL
%SAT: 28 % (ref 15–60)
FERRITIN: 502 ng/mL — AB (ref 20–380)
Iron: 89 ug/dL (ref 50–180)
TIBC: 317 ug/dL (ref 250–425)

## 2016-09-19 LAB — POCT GLYCOSYLATED HEMOGLOBIN (HGB A1C): Hemoglobin A1C: 7.2

## 2016-09-19 LAB — GLUCOSE, POCT (MANUAL RESULT ENTRY): POC Glucose: 232 mg/dl — AB (ref 70–99)

## 2016-09-19 MED ORDER — DICLOFENAC SODIUM 1 % TD GEL: 2.0000 g | Freq: Four times a day (QID) | TRANSDERMAL | 2 refills | Status: DC

## 2016-09-19 MED ORDER — ACETAMINOPHEN-CODEINE #3 300-30 MG PO TABS: 1.0000 | ORAL_TABLET | ORAL | 0 refills | Status: DC | PRN

## 2016-09-19 MED ORDER — AMLODIPINE BESYLATE 10 MG PO TABS: 10.0000 mg | ORAL_TABLET | Freq: Every day | ORAL | 2 refills | Status: DC

## 2016-09-19 MED ORDER — SILDENAFIL CITRATE 25 MG PO TABS: 25.0000 mg | ORAL_TABLET | Freq: Every day | ORAL | 0 refills | Status: DC | PRN

## 2016-09-19 MED ORDER — GLYBURIDE 5 MG PO TABS: 5.0000 mg | ORAL_TABLET | Freq: Every day | ORAL | 3 refills | Status: DC

## 2016-09-19 MED FILL — glyBURIDE 5 MG TABS: 5 | 30 days supply | Qty: 30 | Fill #0

## 2016-09-19 MED FILL — IBUPROFEN 800 MG TABLET: 800 | 5 days supply | Qty: 20 | Fill #0

## 2016-09-19 MED FILL — DICLOFENAC SODIUM 1% GEL: 1 | 13 days supply | Qty: 100 | Fill #0

## 2016-09-19 MED FILL — AMOXICILLIN 500 MG CAPSULE: 500 | 10 days supply | Qty: 30 | Fill #0

## 2016-09-19 MED FILL — CHLORHEXIDINE 0.12% RINSE: 0.12 | 30 days supply | Qty: 473 | Fill #0

## 2016-09-19 MED FILL — AMLODIPINE BESYLATE 10 MG T: 10 | 30 days supply | Qty: 30 | Fill #0

## 2016-09-19 MED FILL — !VIAGRA 25 MG TABLET: 25 | 30 days supply | Qty: 3 | Fill #0

## 2016-09-19 MED FILL — ACETAMINOPHEN/COD #3 TABLET: 300-30 | 6 days supply | Qty: 25 | Fill #0

## 2016-09-19 NOTE — Progress Notes (Signed)
Edward [name], is a 45 y.o. male  LPF:[id number]  ZHG:[id number]  DOB - 1971-05-13  Chief Complaint  Patient presents with  . Diabetes  . Back Pain  . Shoulder Pain        Subjective:   Edward [name] is a 45 y.o. male here today for a follow up visit.  Last seen 6/17 for dm/htn/Graves dz/ckd 3.  Per pt, had multiple eye surgeries in June/july for dm retinopathy. 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  Tries to mostly not drive, but there are times he does.  He admits to medication noncompliance last few months, only taking his bp and dm meds and occasionally. Per pt, he saw [facility] ortho (where he was referred to in July), we do not have any notes of this. Per pt, he was told the surgeon would get back to [place] - which they have not as far as I can tell.  Pt still c.o of significant pain, trying to take minimal tramadol since harsh on his stomach    Patient has No headache, No chest pain, No abdominal pain - No Nausea, No new weakness tingling or numbness, No Cough - SOB.  No problems updated.  ALLERGIES: No Known Allergies  PAST MEDICAL HISTORY: Past Medical History:  Diagnosis Date  . Anemia   . Arthritis   . Chronic kidney disease   . Diabetes mellitus   . Graves disease 2014  . Headache   . Hypertension   . Non-compliance   . Shortness of breath dyspnea     MEDICATIONS AT HOME: Prior to Admission medications   Medication Sig Start Date End Date Taking? Authorizing Provider  amLODipine (NORVASC) 10 MG tablet Take 1 tablet (10 mg total) by mouth daily. 09/19/16  Yes Maren Reamer, MD  Blood Glucose Monitoring Suppl (TRUE METRIX METER) W/DEVICE KIT Check blood sugar before meals and at bedtime 05/29/15  Yes Lance Bosch, NP  glucose blood (TRUE METRIX BLOOD GLUCOSE TEST) test strip Use as instructed 05/29/15  Yes Lance Bosch, NP  methimazole (TAPAZOLE) 5 MG tablet Take 1 tablet (5 mg total) by mouth 3 (three) times  daily. 05/31/16  Yes Philemon Kingdom, MD  TRUEPLUS LANCETS 28G MISC Check blood sugar before meals and at bedtime 05/29/15  Yes Lance Bosch, NP  acetaminophen (TYLENOL) 325 MG tablet Take 650 mg by mouth every 6 (six) hours as needed for mild pain.    Historical Provider, MD  acetaminophen-codeine (TYLENOL #3) 300-30 MG tablet Take 1 tablet by mouth every 4 (four) hours as needed for moderate pain or severe pain. 09/19/16   Maren Reamer, MD  cyclobenzaprine (FLEXERIL) 5 MG tablet Take 1 tablet (5 mg total) by mouth 3 (three) times daily as needed for muscle spasms. Patient not taking: Reported on 09/19/2016 03/28/16   Maren Reamer, MD  diclofenac sodium (VOLTAREN) 1 % GEL Apply 2 g topically 4 (four) times daily. 09/19/16   Maren Reamer, MD  glyBURIDE (DIABETA) 5 MG tablet Take 1 tablet (5 mg total) by mouth daily with breakfast. 09/19/16   Maren Reamer, MD  HYDROcodone-acetaminophen (NORCO/VICODIN) 5-325 MG tablet Take 1-2 tablets by mouth every 4 (four) hours as needed. Patient not taking: Reported on 09/19/2016 06/20/16   Recardo Evangelist, PA-C  ofloxacin (OCUFLOX) 0.3 % ophthalmic solution Place 1 drop into the right eye 4 (four) times daily. 03/22/16   Historical Provider, MD  sildenafil (VIAGRA) 25 MG tablet  Take 1 tablet (25 mg total) by mouth daily as needed for erectile dysfunction. If no effect, may double dose next day/time 09/19/16   Maren Reamer, MD  traMADol (ULTRAM) 50 MG tablet Take 1 tablet (50 mg total) by mouth every 8 (eight) hours as needed for severe pain. Patient not taking: Reported on 09/19/2016 03/28/16   Maren Reamer, MD     Objective:   Vitals:   09/19/16 0912  BP: (!) 164/101  Pulse: 79  Resp: 16  Temp: 98.1 F (36.7 C)  TempSrc: Oral  SpO2: 100%  Weight: 221 lb 3.2 oz (100.3 kg)    Exam General appearance : Awake, alert, not in any distress. Speech Clear. Not toxic looking, pleasant. HEENT: Atraumatic and Normocephalic, pupils  equally reactive to light. Neck: supple, no JVD.  Chest:Good air entry bilaterally, no added sounds. CVS: S1 S2 regular, no murmurs/gallups or rubs. Abdomen: Bowel sounds active, Non tender and not distended with no gaurding, rigidity or rebound. Extremities: B/L Lower Ext shows no edema, both legs are warm to touch Neurology: Awake alert, and oriented X 3, CN II-XII grossly intact, Non focal Skin:No Rash  Data Review Lab Results  Component Value Date   HGBA1C 5.2 03/21/2016   HGBA1C 8.2 (H) 11/25/2015   HGBA1C 8.60 05/05/2015    Depression screen [facility] 2/9 03/21/2016 12/08/2015 05/05/2015  Decreased Interest 2 0 3  Down, Depressed, Hopeless 1 0 2  PHQ - 2 Score 3 0 5  Altered sleeping 0 - 1  Tired, decreased energy 0 - 3  Change in appetite 0 - 2  Feeling bad or failure about yourself  1 - 1  Trouble concentrating 3 - 0  Moving slowly or fidgety/restless 0 - 1  Suicidal thoughts 0 - 0  PHQ-9 Score 7 - 13      Assessment & Plan   1. Uncontrolled hypertension - urged med and diet compliance, info provided - amLODipine (NORVASC) 10 MG tablet; Take 1 tablet (10 mg total) by mouth daily.  Dispense: 30 tablet; Refill: 2 - chk bmp today - rn bp check in 1 wk, if sbp > 130 and creatine <1.4, than start lisinopril 72m qd.    2. 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 - POCT glycosylated hemoglobin (Hb A1C) 7.2 - Ambulatory referral to Ophthalmology - Lipid Panel - Ambulatory referral to Podiatry - chg amaryl [id number] to glyburide [id number], room to increase to bid if remains uncontrolled,  - dw pt consequences of uncontrolled dm, including proliferative disease, retinopathy and nephropathy  3. HNP (herniated nucleus pulposus), lumbar - chronic pain  syndrome - trial voltarin cream, tylenol #3, - pain contract signed today, if pt ends up w/ more rx w/ pain or ortho, than contract will be voided. - Ambulatory referral to Orthopedic Surgery - Ambulatory referral to Pain Clinic - back exercises provided - attempted prior to order mri lumbar, declined numerous times despite appeals  4. Anemia, unspecified type - CBC with Differential - Iron, TIBC and Ferritin Panel - suspect due to chronic disease /ckd  5. ckd 3 - chk bmp today.  6. Pneumococcal 23 and flu vac today.    Patient have been counseled extensively about nutrition and exercise  Return in about 3 months (around 12/18/2016).  The  patient was given clear instructions to go to ER or return to medical center if symptoms don't improve, worsen or new problems develop. The patient verbalized understanding. The patient was told to call to get lab results if they haven't heard anything in the next week.   This note has been created with Surveyor, quantity. Any transcriptional errors are unintentional.   Maren Reamer, MD, [place] [facility] [place], [place]   09/19/2016, 9:43 AM

## 2016-09-19 NOTE — Patient Instructions (Addendum)
- Edward [name]  /bp check 1 wk.  - Please do not drive if considered Legally blind, followup with eye doctor important  Diabetes Mellitus and Food It is important for you to manage your blood sugar (glucose) level. Your blood glucose level can be greatly affected by what you eat. Eating healthier foods in the appropriate amounts throughout the day at about the same time each day will help you control your blood glucose level. It can also help slow or prevent worsening of your diabetes mellitus. Healthy eating may even help you improve the level of your blood pressure and reach or maintain a healthy weight. General recommendations for healthful eating and cooking habits include:  Eating meals and snacks regularly. Avoid going long periods of time without eating to lose weight.  Eating a diet that consists mainly of plant-based foods, such as fruits, vegetables, nuts, legumes, and whole grains.  Using low-heat cooking methods, such as baking, instead of high-heat cooking methods, such as deep frying. Work with your dietitian to make sure you understand how to use the Nutrition Facts information on food labels. How can food affect me? Carbohydrates  Carbohydrates affect your blood glucose level more than any other type of food. Your dietitian will help you determine how many carbohydrates to eat at each meal and teach you how to count carbohydrates. Counting carbohydrates is important to keep your blood glucose at a healthy level, especially if you are using insulin or taking certain medicines for diabetes mellitus. Alcohol  Alcohol can cause sudden decreases in blood glucose (hypoglycemia), especially if you use insulin or take certain medicines for diabetes mellitus. Hypoglycemia can be a life-threatening condition. Symptoms of hypoglycemia (sleepiness, dizziness, and disorientation) are similar to symptoms of having too much alcohol. If your health care provider has given you approval to drink  alcohol, do so in moderation and use the following guidelines:  Women should not have more than one drink per day, and men should not have more than two drinks per day. One drink is equal to:  12 oz of beer.  5 oz of wine.  1 oz of hard liquor.  Do not drink on an empty stomach.  Keep yourself hydrated. Have water, diet soda, or unsweetened iced tea.  Regular soda, juice, and other mixers might contain a lot of carbohydrates and should be counted. What foods are not recommended? As you make food choices, it is important to remember that all foods are not the same. Some foods have fewer nutrients per serving than other foods, even though they might have the same number of calories or carbohydrates. It is difficult to get your body what it needs when you eat foods with fewer nutrients. Examples of foods that you should avoid that are high in calories and carbohydrates but low in nutrients include:  Trans fats (most processed foods list trans fats on the Nutrition Facts label).  Regular soda.  Juice.  Candy.  Sweets, such as cake, pie, doughnuts, and cookies.  Fried foods. What foods can I eat? Eat nutrient-rich foods, which will nourish your body and keep you healthy. The food you should eat also will depend on several factors, including:  The calories you need.  The medicines you take.  Your weight.  Your blood glucose level.  Your blood pressure level.  Your cholesterol level. You should eat a variety of foods, including:  Protein.  Lean cuts of meat.  Proteins low in saturated fats, such as fish, egg whites, and  beans. Avoid processed meats.  Fruits and vegetables.  Fruits and vegetables that may help control blood glucose levels, such as apples, mangoes, and yams.  Dairy products.  Choose fat-free or low-fat dairy products, such as milk, yogurt, and cheese.  Grains, bread, pasta, and rice.  Choose whole grain products, such as multigrain bread, whole  oats, and brown rice. These foods may help control blood pressure.  Fats.  Foods containing healthful fats, such as nuts, avocado, olive oil, canola oil, and fish. Does everyone with diabetes mellitus have the same meal plan? Because every person with diabetes mellitus is different, there is not one meal plan that works for everyone. It is very important that you meet with a dietitian who will help you create a meal plan that is just right for you. This information is not intended to replace advice given to you by your health care provider. Make sure you discuss any questions you have with your health care provider. Document Released: 06/16/2005 Document Revised: 02/25/2016 Document Reviewed: 08/16/2013 Elsevier Interactive Patient Education  2017 Elsevier Inc.   -  Low-Sodium Eating Plan Sodium raises blood pressure and causes water to be held in the body. Getting less sodium from food will help lower your blood pressure, reduce any swelling, and protect your heart, liver, and kidneys. We get sodium by adding salt (sodium chloride) to food. Most of our sodium comes from canned, boxed, and frozen foods. Restaurant foods, fast foods, and pizza are also very high in sodium. Even if you take medicine to lower your blood pressure or to reduce fluid in your body, getting less sodium from your food is important. What is my plan? Most people should limit their sodium intake to 2,300 mg a day. Your health care provider recommends that you limit your sodium intake to 2,000mg  a day. What do I need to know about this eating plan? For the low-sodium eating plan, you will follow these general guidelines:  Choose foods with a % Daily Value for sodium of less than 5% (as listed on the food label).  Use salt-free seasonings or herbs instead of table salt or sea salt.  Check with your health care provider or pharmacist before using salt substitutes.  Eat fresh foods.  Eat more vegetables and  fruits.  Limit canned vegetables. If you do use them, rinse them well to decrease the sodium.  Limit cheese to 1 oz (28 g) per day.  Eat lower-sodium products, often labeled as "lower sodium" or "no salt added."  Avoid foods that contain monosodium glutamate (MSG). MSG is sometimes added to [place] food and some canned foods.  Check food labels (Nutrition Facts labels) on foods to learn how much sodium is in one serving.  Eat more home-cooked food and less restaurant, buffet, and fast food.  When eating at a restaurant, ask that your food be prepared with less salt, or no salt if possible. How do I read food labels for sodium information? The Nutrition Facts label lists the amount of sodium in one serving of the food. If you eat more than one serving, you must multiply the listed amount of sodium by the number of servings. Food labels may also identify foods as:  Sodium free-Less than 5 mg in a serving.  Very low sodium-35 mg or less in a serving.  Low sodium-140 mg or less in a serving.  Light in sodium-50% less sodium in a serving. For example, if a food that usually has 300 mg of sodium  is changed to become light in sodium, it will have 150 mg of sodium.  Reduced sodium-25% less sodium in a serving. For example, if a food that usually has 400 mg of sodium is changed to reduced sodium, it will have 300 mg of sodium. What foods can I eat? Grains  Low-sodium cereals, including oats, puffed wheat and rice, and shredded wheat cereals. Low-sodium crackers. Unsalted rice and pasta. Lower-sodium bread. Vegetables  Frozen or fresh vegetables. Low-sodium or reduced-sodium canned vegetables. Low-sodium or reduced-sodium tomato sauce and paste. Low-sodium or reduced-sodium tomato and vegetable juices. Fruits  Fresh, frozen, and canned fruit. Fruit juice. Meat and Other Protein Products  Low-sodium canned tuna and salmon. Fresh or frozen meat, poultry, seafood, and fish. Lamb. Unsalted  nuts. Dried beans, peas, and lentils without added salt. Unsalted canned beans. Homemade soups without salt. Eggs. Dairy  Milk. Soy milk. Ricotta cheese. Low-sodium or reduced-sodium cheeses. Yogurt. Condiments  Fresh and dried herbs and spices. Salt-free seasonings. Onion and garlic powders. Low-sodium varieties of mustard and ketchup. Fresh or refrigerated horseradish. Lemon juice. Fats and Oils  Reduced-sodium salad dressings. Unsalted butter. Other  Unsalted popcorn and pretzels. The items listed above may not be a complete list of recommended foods or beverages. Contact your dietitian for more options.  What foods are not recommended? Grains  Instant hot cereals. Bread stuffing, pancake, and biscuit mixes. Croutons. Seasoned rice or pasta mixes. Noodle soup cups. Boxed or frozen macaroni and cheese. Self-rising flour. Regular salted crackers. Vegetables  Regular canned vegetables. Regular canned tomato sauce and paste. Regular tomato and vegetable juices. Frozen vegetables in sauces. Salted [place] fries. Olives. Angie Fava. Relishes. Sauerkraut. Salsa. Meat and Other Protein Products  Salted, canned, smoked, spiced, or pickled meats, seafood, or fish. Bacon, ham, sausage, hot dogs, corned beef, chipped beef, and packaged luncheon meats. Salt pork. Jerky. Pickled herring. Anchovies, regular canned tuna, and sardines. Salted nuts. Dairy  Processed cheese and cheese spreads. Cheese curds. Blue cheese and cottage cheese. Buttermilk. Condiments  Onion and garlic salt, seasoned salt, table salt, and sea salt. Canned and packaged gravies. Worcestershire sauce. Tartar sauce. Barbecue sauce. Teriyaki sauce. Soy sauce, including reduced sodium. Steak sauce. Fish sauce. Oyster sauce. Cocktail sauce. Horseradish that you find on the shelf. Regular ketchup and mustard. Meat flavorings and tenderizers. Bouillon cubes. Hot sauce. Tabasco sauce. Marinades. Taco seasonings. Relishes. Fats and Oils  Regular  salad dressings. Salted butter. Margarine. Ghee. Bacon fat. Other  Potato and tortilla chips. Corn chips and puffs. Salted popcorn and pretzels. Canned or dried soups. Pizza. Frozen entrees and pot pies. The items listed above may not be a complete list of foods and beverages to avoid. Contact your dietitian for more information.  This information is not intended to replace advice given to you by your health care provider. Make sure you discuss any questions you have with your health care provider. Document Released: 03/11/2002 Document Revised: 02/25/2016 Document Reviewed: 07/24/2013 Elsevier Interactive Patient Education  2017 Elsevier Inc.  -  Back Exercises Introduction If you have pain in your back, do these exercises 2-3 times each day or as told by your doctor. When the pain goes away, do the exercises once each day, but repeat the steps more times for each exercise (do more repetitions). If you do not have pain in your back, do these exercises once each day or as told by your doctor. Exercises Single Knee to Chest  Do these steps 3-5 times in a row for each leg: 1.  Lie on your back on a firm bed or the floor with your legs stretched out. 2. Bring one knee to your chest. 3. Hold your knee to your chest by grabbing your knee or thigh. 4. Pull on your knee until you feel a gentle stretch in your lower back. 5. Keep doing the stretch for 10-30 seconds. 6. Slowly let go of your leg and straighten it. Pelvic Tilt  Do these steps 5-10 times in a row: 1. Lie on your back on a firm bed or the floor with your legs stretched out. 2. Bend your knees so they point up to the ceiling. Your feet should be flat on the floor. 3. Tighten your lower belly (abdomen) muscles to press your lower back against the floor. This will make your tailbone point up to the ceiling instead of pointing down to your feet or the floor. 4. Stay in this position for 5-10 seconds while you gently tighten your muscles  and breathe evenly. Cat-Cow  Do these steps until your lower back bends more easily: 1. Get on your hands and knees on a firm surface. Keep your hands under your shoulders, and keep your knees under your hips. You may put padding under your knees. 2. Let your head hang down, and make your tailbone point down to the floor so your lower back is round like the back of a cat. 3. Stay in this position for 5 seconds. 4. Slowly lift your head and make your tailbone point up to the ceiling so your back hangs low (sags) like the back of a cow. 5. Stay in this position for 5 seconds. Press-Ups  Do these steps 5-10 times in a row: 1. Lie on your belly (face-down) on the floor. 2. Place your hands near your head, about shoulder-width apart. 3. While you keep your back relaxed and keep your hips on the floor, slowly straighten your arms to raise the top half of your body and lift your shoulders. Do not use your back muscles. To make yourself more comfortable, you may change where you place your hands. 4. Stay in this position for 5 seconds. 5. Slowly return to lying flat on the floor. Bridges  Do these steps 10 times in a row: 1. Lie on your back on a firm surface. 2. Bend your knees so they point up to the ceiling. Your feet should be flat on the floor. 3. Tighten your butt muscles and lift your butt off of the floor until your waist is almost as high as your knees. If you do not feel the muscles working in your butt and the back of your thighs, slide your feet 1-2 inches farther away from your butt. 4. Stay in this position for 3-5 seconds. 5. Slowly lower your butt to the floor, and let your butt muscles relax. If this exercise is too easy, try doing it with your arms crossed over your chest. Belly Crunches  Do these steps 5-10 times in a row: 1. Lie on your back on a firm bed or the floor with your legs stretched out. 2. Bend your knees so they point up to the ceiling. Your feet should be flat on  the floor. 3. Cross your arms over your chest. 4. Tip your chin a little bit toward your chest but do not bend your neck. 5. Tighten your belly muscles and slowly raise your chest just enough to lift your shoulder blades a tiny bit off of the floor. 6. Slowly lower  your chest and your head to the floor. Back Lifts  Do these steps 5-10 times in a row: 1. Lie on your belly (face-down) with your arms at your sides, and rest your forehead on the floor. 2. Tighten the muscles in your legs and your butt. 3. Slowly lift your chest off of the floor while you keep your hips on the floor. Keep the back of your head in line with the curve in your back. Look at the floor while you do this. 4. Stay in this position for 3-5 seconds. 5. Slowly lower your chest and your face to the floor. Contact a doctor if:  Your back pain gets a lot worse when you do an exercise.  Your back pain does not lessen 2 hours after you exercise. If you have any of these problems, stop doing the exercises. Do not do them again unless your doctor says it is okay. Get help right away if:  You have sudden, very bad back pain. If this happens, stop doing the exercises. Do not do them again unless your doctor says it is okay. This information is not intended to replace advice given to you by your health care provider. Make sure you discuss any questions you have with your health care provider. Document Released: 10/22/2010 Document Revised: 02/25/2016 Document Reviewed: 11/13/2014  2017 Elsevier  Influenza Virus Vaccine injection (Fluarix) What is this medicine? INFLUENZA VIRUS VACCINE (in floo EN zuh VAHY ruhs vak SEEN) helps to reduce the risk of getting influenza also known as the flu. This medicine may be used for other purposes; ask your health care provider or pharmacist if you have questions. COMMON BRAND NAME(S): Fluarix, Fluzone What should I tell my health care provider before I take this medicine? They need to know  if you have any of these conditions: -bleeding disorder like hemophilia -fever or infection -Guillain-Barre syndrome or other neurological problems -immune system problems -infection with the human immunodeficiency virus (HIV) or AIDS -low blood platelet counts -multiple sclerosis -an unusual or allergic reaction to influenza virus vaccine, eggs, chicken proteins, latex, gentamicin, other medicines, foods, dyes or preservatives -pregnant or trying to get pregnant -breast-feeding How should I use this medicine? This vaccine is for injection into a muscle. It is given by a health care professional. A copy of Vaccine Information Statements will be given before each vaccination. Read this sheet carefully each time. The sheet may change frequently. Talk to your pediatrician regarding the use of this medicine in children. Special care may be needed. Overdosage: If you think you have taken too much of this medicine contact a poison control center or emergency room at once. NOTE: This medicine is only for you. Do not share this medicine with others. What if I miss a dose? This does not apply. What may interact with this medicine? -chemotherapy or radiation therapy -medicines that lower your immune system like etanercept, anakinra, infliximab, and adalimumab -medicines that treat or prevent blood clots like warfarin -phenytoin -steroid medicines like prednisone or cortisone -theophylline -vaccines This list may not describe all possible interactions. Give your health care provider a list of all the medicines, herbs, non-prescription drugs, or dietary supplements you use. Also tell them if you smoke, drink alcohol, or use illegal drugs. Some items may interact with your medicine. What should I watch for while using this medicine? Report any side effects that do not go away within 3 days to your doctor or health care professional. Call your health care provider if  any unusual symptoms occur within  6 weeks of receiving this vaccine. You may still catch the flu, but the illness is not usually as bad. You cannot get the flu from the vaccine. The vaccine will not protect against colds or other illnesses that may cause fever. The vaccine is needed every year. What side effects may I notice from receiving this medicine? Side effects that you should report to your doctor or health care professional as soon as possible: -allergic reactions like skin rash, itching or hives, swelling of the face, lips, or tongue Side effects that usually do not require medical attention (report to your doctor or health care professional if they continue or are bothersome): -fever -headache -muscle aches and pains -pain, tenderness, redness, or swelling at site where injected -weak or tired This list may not describe all possible side effects. Call your doctor for medical advice about side effects. You may report side effects to FDA at 1-800-FDA-1088. Where should I keep my medicine? This vaccine is only given in a clinic, pharmacy, doctor's office, or other health care setting and will not be stored at home. NOTE: This sheet is a summary. It may not cover all possible information. If you have questions about this medicine, talk to your doctor, pharmacist, or health care provider.  2017 Elsevier/Gold Standard (2008-04-16 09:30:40) Pneumococcal Polysaccharide Vaccine: What You Need to Know 1. Why get vaccinated? Vaccination can protect older adults (and some children and younger adults) from pneumococcal disease. Pneumococcal disease is caused by bacteria that can spread from person to person through close contact. It can cause ear infections, and it can also lead to more serious infections of the:  Lungs (pneumonia),  Blood (bacteremia), and  Covering of the brain and spinal cord (meningitis). Meningitis can cause deafness and brain damage, and it can be fatal. Anyone can get pneumococcal disease, but children  under 66 years of age, people with certain medical conditions, adults over 72 years of age, and cigarette smokers are at the highest risk. About 18,000 older adults die each year from pneumococcal disease in the [place]. Treatment of pneumococcal infections with penicillin and other drugs used to be more effective. But some strains of the disease have become resistant to these drugs. This makes prevention of the disease, through vaccination, even more important. 2. Pneumococcal polysaccharide vaccine (PPSV23) Pneumococcal polysaccharide vaccine (PPSV23) protects against 23 types of pneumococcal bacteria. It will not prevent all pneumococcal disease. PPSV23 is recommended for:  All adults 79 years of age and older,  Anyone 2 through 45 years of age with certain long-term health problems,  Anyone 2 through 44 years of age with a weakened immune system,  Adults 15 through 45 years of age who smoke cigarettes or have asthma. Most people need only one dose of PPSV. A second dose is recommended for certain high-risk groups. People 39 and older should get a dose even if they have gotten one or more doses of the vaccine before they turned 65. Your healthcare provider can give you more information about these recommendations. Most healthy adults develop protection within 2 to 3 weeks of getting the shot. 3. Some people should not get this vaccine  Anyone who has had a life-threatening allergic reaction to PPSV should not get another dose.  Anyone who has a severe allergy to any component of PPSV should not receive it. Tell your provider if you have any severe allergies.  Anyone who is moderately or severely ill when the shot is  scheduled may be asked to wait until they recover before getting the vaccine. Someone with a mild illness can usually be vaccinated.  Children less than 70 years of age should not receive this vaccine.  There is no evidence that PPSV is harmful to either a pregnant woman  or to her fetus. However, as a precaution, women who need the vaccine should be vaccinated before becoming pregnant, if possible. 4. Risks of a vaccine reaction With any medicine, including vaccines, there is a chance of side effects. These are usually mild and go away on their own, but serious reactions are also possible. About half of people who get PPSV have mild side effects, such as redness or pain where the shot is given, which go away within about two days. Less than 1 out of 100 people develop a fever, muscle aches, or more severe local reactions. Problems that could happen after any vaccine:  People sometimes faint after a medical procedure, including vaccination. Sitting or lying down for about 15 minutes can help prevent fainting, and injuries caused by a fall. Tell your doctor if you feel dizzy, or have vision changes or ringing in the ears.  Some people get severe pain in the shoulder and have difficulty moving the arm where a shot was given. This happens very rarely.  Any medication can cause a severe allergic reaction. Such reactions from a vaccine are very rare, estimated at about 1 in a million doses, and would happen within a few minutes to a few hours after the vaccination. As with any medicine, there is a very remote chance of a vaccine causing a serious injury or death. The safety of vaccines is always being monitored. For more information, visit: http://www.aguilar.org/ 5. What if there is a serious reaction? What should I look for? Look for anything that concerns you, such as signs of a severe allergic reaction, very high fever, or unusual behavior. Signs of a severe allergic reaction can include hives, swelling of the face and throat, difficulty breathing, a fast heartbeat, dizziness, and weakness. These would usually start a few minutes to a few hours after the vaccination. What should I do? If you think it is a severe allergic reaction or other emergency that can't  wait, call 9-1-1 or get to the nearest hospital. Otherwise, call your doctor. Afterward, the reaction should be reported to the Vaccine Adverse Event Reporting System (VAERS). Your doctor might file this report, or you can do it yourself through the VAERS web site at www.vaers.SamedayNews.es, or by calling (478)013-1440. VAERS does not give medical advice. 6. How can I learn more?  Ask your doctor. He or she can give you the vaccine package insert or suggest other sources of information.  Call your local or state health department.  Contact the Centers for Disease Control and Prevention (CDC):  Call 564 820 2350 (1-800-CDC-INFO) or  Visit CDC's website at http://hunter.com/ CDC Pneumococcal Polysaccharide Vaccine VIS (01/24/14) This information is not intended to replace advice given to you by your health care provider. Make sure you discuss any questions you have with your health care provider. Document Released: 07/17/2006 Document Revised: 06/09/2016 Document Reviewed: 06/09/2016 Elsevier Interactive Patient Education  2017 [employer].

## 2016-09-20 ENCOUNTER — Telehealth: Payer: Self-pay | Admitting: Internal Medicine

## 2016-09-20 ENCOUNTER — Encounter: Payer: Self-pay | Admitting: Internal Medicine

## 2016-09-20 ENCOUNTER — Ambulatory Visit (INDEPENDENT_AMBULATORY_CARE_PROVIDER_SITE_OTHER): Payer: Medicaid Other | Admitting: Internal Medicine

## 2016-09-20 ENCOUNTER — Ambulatory Visit: Payer: Medicaid Other | Attending: Internal Medicine

## 2016-09-20 ENCOUNTER — Other Ambulatory Visit: Payer: Self-pay | Admitting: Internal Medicine

## 2016-09-20 VITALS — BP 152/86 | HR 97 | Ht 72.0 in | Wt 223.2 lb

## 2016-09-20 DIAGNOSIS — N183 Chronic kidney disease, stage 3 unspecified: Secondary | ICD-10-CM

## 2016-09-20 DIAGNOSIS — E0500 Thyrotoxicosis with diffuse goiter without thyrotoxic crisis or storm: Secondary | ICD-10-CM | POA: Diagnosis not present

## 2016-09-20 LAB — TSH: TSH: 0.04 u[IU]/mL — AB (ref 0.35–4.50)

## 2016-09-20 LAB — T3, FREE: T3 FREE: 3.7 pg/mL (ref 2.3–4.2)

## 2016-09-20 LAB — HIV ANTIBODY (ROUTINE TESTING W REFLEX): HIV 1&2 Ab, 4th Generation: NONREACTIVE

## 2016-09-20 LAB — T4, FREE: FREE T4: 1.5 ng/dL (ref 0.60–1.60)

## 2016-09-20 MED ORDER — METOPROLOL SUCCINATE ER 50 MG PO TB24: 50.0000 mg | ORAL_TABLET | Freq: Every day | ORAL | 2 refills | Status: DC

## 2016-09-20 MED ORDER — ATENOLOL 50 MG PO TABS: 50.0000 mg | ORAL_TABLET | Freq: Every day | ORAL | 2 refills | Status: DC

## 2016-09-20 MED ORDER — PRAVASTATIN SODIUM 20 MG PO TABS: 20.0000 mg | ORAL_TABLET | Freq: Every day | ORAL | 3 refills | Status: DC

## 2016-09-20 MED ORDER — METHIMAZOLE 5 MG PO TABS: ORAL_TABLET | ORAL | 1 refills | Status: DC

## 2016-09-20 MED FILL — PRAVASTATIN NA 20 MG TAB: 20 | 30 days supply | Qty: 30 | Fill #0

## 2016-09-20 NOTE — Telephone Encounter (Signed)
[facility] called and said the Atenolol is on back order and they were wondering if there is an alternative that Dr. Cruzita Lederer might want to switch him to. Please advise.

## 2016-09-20 NOTE — Patient Instructions (Addendum)
Please restart Methimazole 10 mg in am and 5 mg with dinner.  Start Atenolol 50 mg in the evening.  Please stop at the lab.  Please come back for a follow-up appointment in 2 months.

## 2016-09-20 NOTE — Telephone Encounter (Signed)
Done

## 2016-09-20 NOTE — Progress Notes (Signed)
Patient ID: Edward [name], male   DOB: 07-30-1971, 45 y.o.   MRN: [id number]   HPI  Edward [name] is a 45 y.o.-year-old male, initially referred by his PCP, Dr. Feliciana Rossetti, returning for f/u for Graves ds. Last visit 4 mo ago.  He continues to have mm pain that has been going on for ~1.5 years >> shoulders and upper back >> down arms  >> arm moving uncontrollably. Also pain in hands. He saw neurology and will go see orthopedics.  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   I reviewed pt's thyroid tests: Lab Results  Component Value Date   TSH 0.03 (L) 05/31/2016   TSH 0.05 (L) 04/11/2016   TSH 0.07 (L) 02/26/2016   TSH 0.01 (L) 01/08/2016   TSH 0.018 (L) 11/25/2015   TSH <0.008 (L) 05/29/2015   TSH <0.008 (L) 06/21/2013   FREET4 1.24 05/31/2016   FREET4 1.14 04/11/2016   FREET4 1.97 (H) 02/26/2016   FREET4 2.1 (H) 01/08/2016   FREET4 1.27 05/29/2015    Component     Latest Ref Rng 02/26/2016  TSI     <140 % baseline 463 (H)  Elevated Graves antibodies + overt thyrotoxicosis >> dx of Graves' disease.   We started methimazole at 5 mg twice a day >> increased to 3x a day at last visit. He tells me he skipped days and even weeks. He did not come for a repeat set of TFTs after the change in dose...  Pt denies feeling nodules in neck, hoarseness, dysphagia/odynophagia, + occasional SOB with lying down; he mentions: - no weight loss, + 13 lbs weight gain - + fatigue - no excessive sweating/heat intolerance - + tremors - no anxiety - + insomnia - + palpitations - no hyperdefecation  Pt does not have a FH of thyroid ds. No FH of thyroid cancer. No h/o radiation tx to head or neck.  No seaweed or kelp, no recent contrast studies. No steroid use. No herbal supplements. No Biotin use.   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   He just had several surgeries - OU  DR.  ROS: Constitutional: + see HPI, + excessive urination, + nocturia Eyes: + blurry vision, no xerophthalmia ENT: no sore throat, no nodules palpated in throat, no dysphagia/odynophagia, no hoarseness Cardiovascular: no CP/+ palpitations/no leg swelling Respiratory: no cough/SOB Gastrointestinal: + N/no V/D/+ C Musculoskeletal: + both: muscle/joint aches Skin: no rashes, no hair loss (thinning) Neurological: no tremors/numbness/tingling/dizziness, + HA  I reviewed pt's medications, allergies, PMH, social hx, family hx, and changes were documented in the history of present illness. Otherwise, unchanged from my initial visit note.  Past Medical History:  Diagnosis Date  . Anemia   . Arthritis   . Chronic kidney disease   . Diabetes mellitus   . Graves disease 2014  . Headache   . Hypertension   . Non-compliance   . Shortness of breath dyspnea    Past Surgical History:  Procedure Laterality Date  . CIRCUMCISION     as a child, around 89 years of age  . EYE SURGERY    . PARS PLANA VITRECTOMY Right 03/25/2016   Procedure: PARS PLANA VITRECTOMY 25 GAUGE FOR ENDOPHTHALMITIS;  Surgeon: Jalene Mullet, MD;  Location: [facility];  Service: Ophthalmology;  Laterality: Right;  . WISDOM TOOTH EXTRACTION     Social History   Social History  .  Marital Status: Married    Spouse Name: N/A  . Number of Children: 5: 25-4 y/o (2017)  . Years of Education: N/A   Occupational History  . Chef-cook   Social History Main Topics  . Smoking status: Former Smoker -- 0.75 packs/day for 3 years    Types: Cigarettes    Quit date: 2004  . Smokeless tobacco: Not on file  . Alcohol Use:      Comment: 1 beer every night  . Drug Use: No   Current Outpatient Prescriptions on File Prior to Visit  Medication Sig Dispense Refill  . acetaminophen (TYLENOL) 325 MG tablet Take 650 mg by mouth every 6 (six) hours as needed for mild pain.    Marland Kitchen acetaminophen-codeine (TYLENOL #3) 300-30 MG tablet Take 1  tablet by mouth every 4 (four) hours as needed for moderate pain or severe pain. 50 tablet 0  . amLODipine (NORVASC) 10 MG tablet Take 1 tablet (10 mg total) by mouth daily. 30 tablet 2  . Blood Glucose Monitoring Suppl (TRUE METRIX METER) W/DEVICE KIT Check blood sugar before meals and at bedtime 1 kit 0  . cyclobenzaprine (FLEXERIL) 5 MG tablet Take 1 tablet (5 mg total) by mouth 3 (three) times daily as needed for muscle spasms. (Patient not taking: Reported on 09/19/2016) 45 tablet 0  . diclofenac sodium (VOLTAREN) 1 % GEL Apply 2 g topically 4 (four) times daily. 100 g 2  . glucose blood (TRUE METRIX BLOOD GLUCOSE TEST) test strip Use as instructed 100 each 12  . glyBURIDE (DIABETA) 5 MG tablet Take 1 tablet (5 mg total) by mouth daily with breakfast. 30 tablet 3  . HYDROcodone-acetaminophen (NORCO/VICODIN) 5-325 MG tablet Take 1-2 tablets by mouth every 4 (four) hours as needed. (Patient not taking: Reported on 09/19/2016) 10 tablet 0  . methimazole (TAPAZOLE) 5 MG tablet Take 1 tablet (5 mg total) by mouth 3 (three) times daily. 90 tablet 1  . ofloxacin (OCUFLOX) 0.3 % ophthalmic solution Place 1 drop into the right eye 4 (four) times daily.  0  . sildenafil (VIAGRA) 25 MG tablet Take 1 tablet (25 mg total) by mouth daily as needed for erectile dysfunction. If no effect, may double dose next day/time 10 tablet 0  . traMADol (ULTRAM) 50 MG tablet Take 1 tablet (50 mg total) by mouth every 8 (eight) hours as needed for severe pain. (Patient not taking: Reported on 09/19/2016) 45 tablet 0  . TRUEPLUS LANCETS 28G MISC Check blood sugar before meals and at bedtime 100 each 11  . [DISCONTINUED] gabapentin (NEURONTIN) 100 MG capsule Take 1 capsule (100 mg total) by mouth 3 (three) times daily. 60 capsule 0   No current facility-administered medications on file prior to visit.    No Known Allergies Family History  Problem Relation Age of Onset  . Hypertension Mother   . Diabetes Mother     PE: BP (!) 152/86   Pulse 97   Ht 6' (1.829 m)   Wt 223 lb 3.2 oz (101.2 kg)   SpO2 99%   BMI 30.27 kg/m  She did not take his BP meds today. Wt Readings from Last 3 Encounters:  09/20/16 223 lb 3.2 oz (101.2 kg)  09/19/16 221 lb 3.2 oz (100.3 kg)  06/20/16 210 lb (95.3 kg)   Constitutional: overweight, in NAD Eyes: PERRLA, EOMI, + exophthalmos, no lid lag, no stare ENT: moist mucous membranes, no thyromegaly - But left lobe slightly more prominent, no cervical lymphadenopathy Cardiovascular: tachycardia,  RR, No MRG Respiratory: CTA B Gastrointestinal: abdomen soft, NT, ND, BS+ Musculoskeletal: no deformities, strength intact in all 4 Skin: moist, warm, no rashes Neurological: no tremor with outstretched hands, DTR normal in all 4  ASSESSMENT: 1. 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 Patient with a low TSH dating since at least 2014, with thyrotoxic sxs: weight loss, heat intolerance, occasional palpitations, fatigue, generalized pain, insomnia. We dx'ed Graves ds based on thyrotoxicosis timeline and elevated TSIs. We started MMI >> he responded well to this >> we increased dose to MMI 5 mg 3x a day at last visit, but he reports noncompliance and subsequently he has a high BP and a high pulse today. Will restart MMI at 10 mg in am and 5 mg in pm and add Atenolol 50 mg daily. Strongly advised him to start taking his meds as Rx'ed to avoid Graves ds complications and also to start feeling better. - will check TSH, fT3 and fT4 today - we discussed about possible modalities of treatment for the above conditions, to include methimazole use, radioactive iodine ablation or (last resort) surgery. He agrees to continue [place] for now - RTC in 2 months  Component     Latest Ref Rng & Units 09/20/2016  TSH     0.35 - 4.50 uIU/mL 0.04 (L)  T4,Free(Direct)     0.60 - 1.60 ng/dL 1.50  Triiodothyronine,Free,Serum     2.3 - 4.2 pg/mL 3.7   TSH is low what the rest of  the labs are normal. Since I am not sure about how many doses of the methimazole he missed before, will continue at 10 mg in a.m. and 5 mg in p.m. for the next 2 months and have him back for another visit and labs then. I believe that we will need to decrease the dose at that point.  Philemon Kingdom, MD PhD [facility]

## 2016-09-20 NOTE — Telephone Encounter (Signed)
We can switch to Toprol xl 50 mg daily

## 2016-09-28 ENCOUNTER — Other Ambulatory Visit: Payer: Self-pay

## 2016-10-04 MED ORDER — VOLTAREN 1 % TD GEL: 2.0000 g | Freq: Four times a day (QID) | TRANSDERMAL | 2 refills | Status: DC

## 2016-10-04 MED FILL — VOLTAREN 1% GEL: 1 | 12 days supply | Qty: 100 | Fill #0

## 2016-10-10 ENCOUNTER — Other Ambulatory Visit: Payer: Self-pay | Admitting: *Deleted

## 2016-10-10 MED ORDER — SILDENAFIL CITRATE 25 MG PO TABS: 25.0000 mg | ORAL_TABLET | Freq: Every day | ORAL | 3 refills | Status: DC | PRN

## 2016-10-10 NOTE — Telephone Encounter (Signed)
PRINTED FOR PASS PROGRAM 

## 2016-10-12 MED FILL — METOPROLOL SUCC ER 50 MG TA: 50 | 30 days supply | Qty: 30 | Fill #0

## 2016-10-12 MED FILL — !VIAGRA 25 MG TABLET: 25 | 30 days supply | Qty: 3 | Fill #1

## 2016-10-17 ENCOUNTER — Ambulatory Visit (INDEPENDENT_AMBULATORY_CARE_PROVIDER_SITE_OTHER): Payer: Medicaid Other | Admitting: Podiatry

## 2016-10-17 ENCOUNTER — Encounter: Payer: Self-pay | Admitting: Podiatry

## 2016-10-17 DIAGNOSIS — E1143 Type 2 diabetes mellitus with diabetic autonomic (poly)neuropathy: Secondary | ICD-10-CM | POA: Diagnosis not present

## 2016-10-17 MED ORDER — NONFORMULARY OR COMPOUNDED ITEM: 1.0000 g | Freq: Four times a day (QID) | 2 refills | Status: DC

## 2016-10-19 ENCOUNTER — Other Ambulatory Visit: Payer: Medicaid Other

## 2016-10-19 NOTE — Progress Notes (Signed)
   Subjective:  46 year old male presents today for evaluation of a diabetic foot exam. Patient states that he does complain of neuropathy and pain at times intermittently. No aggravating factors no alleviating factors. Patient also has a history of Graves' disease    Objective/Physical Exam General: The patient is alert and oriented x3 in no acute distress.  Dermatology: Skin is warm, dry and supple bilateral lower extremities. Negative for open lesions or macerations.  Vascular: Palpable pedal pulses bilaterally. No edema or erythema noted. Capillary refill within normal limits.  Neurological: Epicritic and protective threshold diminished bilaterally.   Musculoskeletal Exam: Range of motion within normal limits to all pedal and ankle joints bilateral. Muscle strength 5/5 in all groups bilateral.   Assessment: #1 diabetes mellitus with peripheral neuropathy   Plan of Care:  #1 Patient was evaluated. #2 prescription for peripheral neuropathy pain cream dispensed through [facility] #3 the patient would prefer not to take another oral medication. If the patient changes his mind we will prescribe gabapentin 100 mg 3 times a day #4 tray to clinic annually for routine foot exam   Edrick Kins, DPM [facility]  Dr. Edrick Kins, [place]                                        [place], [place] [address]                Office [phone]  Fax [phone]

## 2016-10-21 ENCOUNTER — Ambulatory Visit (INDEPENDENT_AMBULATORY_CARE_PROVIDER_SITE_OTHER): Payer: Medicaid Other

## 2016-10-21 ENCOUNTER — Ambulatory Visit (INDEPENDENT_AMBULATORY_CARE_PROVIDER_SITE_OTHER): Payer: Medicaid Other | Admitting: Orthopaedic Surgery

## 2016-10-21 ENCOUNTER — Ambulatory Visit (INDEPENDENT_AMBULATORY_CARE_PROVIDER_SITE_OTHER): Payer: Self-pay

## 2016-10-21 ENCOUNTER — Encounter (INDEPENDENT_AMBULATORY_CARE_PROVIDER_SITE_OTHER): Payer: Self-pay | Admitting: Orthopaedic Surgery

## 2016-10-21 VITALS — BP 164/92 | HR 94 | Ht 72.0 in | Wt 223.0 lb

## 2016-10-21 DIAGNOSIS — M25512 Pain in left shoulder: Secondary | ICD-10-CM

## 2016-10-21 DIAGNOSIS — M25561 Pain in right knee: Secondary | ICD-10-CM | POA: Diagnosis not present

## 2016-10-21 DIAGNOSIS — M25511 Pain in right shoulder: Secondary | ICD-10-CM

## 2016-10-21 DIAGNOSIS — M25562 Pain in left knee: Secondary | ICD-10-CM

## 2016-10-21 DIAGNOSIS — M542 Cervicalgia: Secondary | ICD-10-CM

## 2016-10-21 NOTE — Progress Notes (Signed)
Office Visit Note   Patient: Edward [name]           Date of Birth: 1971/09/03           MRN: [id number] Visit Date: 10/21/2016              Requested by: Maren Reamer, MD [address] [place], [place] [address] PCP: Maren Reamer, MD   Assessment & Plan: Visit Diagnoses:  1. Neck pain   2. Right shoulder pain, unspecified chronicity   3. Left shoulder pain, unspecified chronicity   4. Pain in both knees, unspecified chronicity     Plan: We'll obtain a arthritis panel to make sure there is not something correctable to be given all these different types of pain which he states all are very severe in all similar intensity. We'll see him back again in 2 weeks for lab review. I reviewed with him previous MRIs of the been 9 2007 2009 which showed the L5-S1 disc protrusion broad-based than on the right. It had enlarged to between 2007 2009.  Follow-Up Instructions: Return in about 2 weeks (around 11/04/2016).   Orders:  Orders Placed This Encounter  Procedures  . XR Cervical Spine 2 or 3 views  . XR Knee 1-2 Views Right  . XR Shoulder Left  . XR Shoulder Right   No orders of the defined types were placed in this encounter.     Procedures: No procedures performed   Clinical Data: No additional findings.   Subjective: Chief Complaint  Patient presents with  . Lower Back - Pain  . Right Shoulder - Pain  . Left Shoulder - Pain    Patient presents with multiple complaints. 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 He has low back pain that radiates across his back and down the backs of his legs. He does complain of pain in both knees. He also has complaint of bilateral shoulder pain, right greater than left. He has decreased range of motion and states it is difficult to even get his coat on and off. He denies any injury. He has taken tylenol #3, hydrocodone, and tramadol with no relief. He states that he has had regular x-rays made  before and he thinks maybe something else should be done.     Review of Systems  Constitutional: Negative for chills and diaphoresis.  HENT: Negative for ear discharge, ear pain and nosebleeds.   Eyes: Negative for discharge and visual disturbance.       Patient states that he is legally blind he states he can see a little bit better fuses reading glasses for objects for close up.  Respiratory: Negative for cough, choking and shortness of breath.   Cardiovascular: Negative for chest pain and palpitations.  Gastrointestinal: Negative for abdominal distention and abdominal pain.  Endocrine: Negative for cold intolerance and heat intolerance.       Positive for years of uncontrolled diabetes A1c is 12 to13 in the past several years. states he's never been on insulin. He states he is legally blind from his diabetes.  Genitourinary: Negative for flank pain and hematuria.  Musculoskeletal:       Patient complains of chronic shoulder pain chronic back pain states he's been told he has 2 ruptured disks in lumbar spine.( Lumbar MRI shows a disc bulge at L5-S1 only) his chronic knee pain is painful when he walks. No history of rheumatologic disease. He has had a  normal AMA several years ago and one sedimentation rate that was 40.  Skin: Negative for rash and wound.  Allergic/Immunologic:       Positive for Graves' disease.  Neurological: Negative for seizures and speech difficulty.  Hematological: Negative for adenopathy. Does not bruise/bleed easily.  Psychiatric/Behavioral: Negative for agitation and suicidal ideas.     Objective: Val Signs: BP (!) 164/92   Pulse 94   Ht 6' (1.829 m)   Wt 223 lb (101.2 kg)   BMI 30.24 kg/m   Physical Exam  Constitutional: He is oriented to person, place, and time. He appears well-developed and well-nourished.  HENT:  Head: Normocephalic and atraumatic.  Eyes: Conjunctivae and EOM are normal.  Neck: No tracheal deviation present. No thyromegaly present.   Cardiovascular: Normal rate.   Pulmonary/Chest: Effort normal. He has no wheezes.  Abdominal: Soft. Bowel sounds are normal.  Musculoskeletal:  Patient can abduct both shoulders only 45 to possibly 90 can do both at once. No suprascapular atrophy no supraspinatus atrophy. Long head of the biceps is tender brachial plexus tenderness pain with flexion and rotation of cervical spine reflexes are 2+ upper and lower. He is very slow with knee flexion extension states are very painful no knee effusion collateral ligaments are stable crepitus with knee extension or flexion. He is diffusely tender with palpation all around his knee. Anterior tib EHL is intact knee jerk is intact 1+ and symmetrical. Distal pulses palpable.  Neurological: He is alert and oriented to person, place, and time.  Skin: Skin is warm and dry. Capillary refill takes less than 2 seconds.  Psychiatric: He has a normal mood and affect. His behavior is normal. Judgment and thought content normal.    Ortho Exam  Specialty Comments:  No specialty comments available.  Imaging: Xr Cervical Spine 2 Or 3 Views  Result Date: 10/21/2016 2 views cervical spine AP and lateral obtained and reviewed. This shows normal cervical curve. Slight calcification C5-6 anterior, and a lesser degree C6-7. Minimal uncovertebral changes no facet arthropathy. Impression: C-spine with normal alignment and position slight anterior calcification C5-6  Xr Knee 1-2 Views Right  Result Date: 10/21/2016 Standing AP both knees show normal disc space height no evidence of fracture no significant degenerative changes. Impression normal standing knee x-rays satisfactory alignment and position  Xr Shoulder Left  Result Date: 10/21/2016 2 views left shoulder obtained. Mild degenerative changes AC joint. Small inferior glenoid spur humeral head is not high riding no soft tissue calcifications rotator cuff. Impression normal left shoulder with slight before meals  degenerative changes.  Xr Shoulder Right  Result Date: 10/21/2016 Two-view x-rays right shoulder obtained. This shows some degenerative changes at the [facility] joint. Small inferior glenoid spur no high riding of the head no rotator cuff calcification. Impression right shoulder with minimal AC degenerative changes    PMFS History: Patient Active Problem List   Diagnosis Date Noted  . Graves disease 03/02/2016  . Non compliance with medical treatment 11/26/2015  . Uncontrolled hypertension 11/26/2015  . Acute kidney injury superimposed on chronic kidney disease ([facility]) 11/25/2015  . Chest pain at rest 11/25/2015  . Hyperkalemia 11/25/2015  . Diabetes mellitus type 2, uncontrolled, with complications ([place]) 16/07/9603   Past Medical History:  Diagnosis Date  . Anemia   . Arthritis   . Chronic kidney disease   . Diabetes mellitus   . Graves disease 2014  . Headache   . Hypertension   . Non-compliance   . Shortness of breath  dyspnea     Family History  Problem Relation Age of Onset  . Hypertension Mother   . Diabetes Mother     Past Surgical History:  Procedure Laterality Date  . CIRCUMCISION     as a child, around 35 years of age  . EYE SURGERY    . PARS PLANA VITRECTOMY Right 03/25/2016   Procedure: PARS PLANA VITRECTOMY 25 GAUGE FOR ENDOPHTHALMITIS;  Surgeon: Jalene Mullet, MD;  Location: [facility];  Service: Ophthalmology;  Laterality: Right;  . WISDOM TOOTH EXTRACTION     Social History   Occupational History  . Not on file.   Social History Main Topics  . Smoking status: Former Smoker    Packs/day: 0.75    Years: 3.00    Types: Cigarettes    Quit date: 05/28/2000  . Smokeless tobacco: Never Used  . Alcohol use 0.0 oz/week     Comment: 1 beer every night  . Drug use: No  . Sexual activity: Not on file

## 2016-10-21 NOTE — Addendum Note (Signed)
Addended by: Meyer Cory on: 10/21/2016 04:02 PM   Modules accepted: Orders

## 2016-10-22 LAB — URIC ACID: URIC ACID, SERUM: 8.2 mg/dL — AB (ref 4.0–8.0)

## 2016-10-22 LAB — SEDIMENTATION RATE: Sed Rate: 45 mm/hr — ABNORMAL HIGH (ref 0–15)

## 2016-10-24 LAB — RHEUMATOID FACTOR

## 2016-10-24 LAB — ANA: ANA: NEGATIVE

## 2016-10-25 ENCOUNTER — Ambulatory Visit (HOSPITAL_BASED_OUTPATIENT_CLINIC_OR_DEPARTMENT_OTHER): Payer: Medicaid Other | Admitting: *Deleted

## 2016-10-25 ENCOUNTER — Ambulatory Visit: Payer: Medicaid Other | Attending: Internal Medicine

## 2016-10-25 ENCOUNTER — Telehealth: Payer: Self-pay | Admitting: Internal Medicine

## 2016-10-25 VITALS — BP 158/90 | HR 73

## 2016-10-25 DIAGNOSIS — Z125 Encounter for screening for malignant neoplasm of prostate: Secondary | ICD-10-CM | POA: Diagnosis present

## 2016-10-25 DIAGNOSIS — N183 Chronic kidney disease, stage 3 unspecified: Secondary | ICD-10-CM

## 2016-10-25 DIAGNOSIS — I10 Essential (primary) hypertension: Secondary | ICD-10-CM

## 2016-10-25 LAB — BASIC METABOLIC PANEL WITH GFR
BUN: 36 mg/dL — ABNORMAL HIGH (ref 7–25)
CALCIUM: 8.4 mg/dL — AB (ref 8.6–10.3)
CO2: 18 mmol/L — ABNORMAL LOW (ref 20–31)
Chloride: 111 mmol/L — ABNORMAL HIGH (ref 98–110)
Creat: 2.36 mg/dL — ABNORMAL HIGH (ref 0.60–1.35)
GFR, EST AFRICAN AMERICAN: 37 mL/min — AB (ref >=60)
GFR, EST NON AFRICAN AMERICAN: 32 mL/min — AB (ref >=60)
Glucose, Bld: 275 mg/dL — ABNORMAL HIGH (ref 65–99)
Potassium: 5 mmol/L (ref 3.5–5.3)
SODIUM: 137 mmol/L (ref 135–146)

## 2016-10-25 LAB — PSA: PSA: 1 ng/mL (ref <=4.0)

## 2016-10-25 NOTE — Progress Notes (Signed)
Pt here for BP check. Blood pressure was taken with large size cuff. Blood pressure taken in sitting position. Blood pressure was taken manually in both arms.  BP: 152/90 Left arm  BP: 158/90 Right arm Pt denies chest pain, dizziness, SOB. States he has blurred vision but is legally blind. He states he notices he is dizzy when he doesn't eat for long periods. Non pitting edema  BLE. Pt states he was  informed by the Orthopedist that his uric acid level is elevated and he has swelling. He was also told that he may need medication to lower uric acid.

## 2016-10-25 NOTE — Telephone Encounter (Signed)
Pt. Came into facility requesting to speak with his PCP. Pt. States he has to give her some information and would rather speak with her. Please f/u

## 2016-10-25 NOTE — Telephone Encounter (Signed)
Pt states he was  informed by the Orthopedist that his uric acid level is elevated and he has swelling. He was also told that he may need medication to lower uric acid. Had nurse and labs today for  BP check and lab work.

## 2016-10-25 NOTE — Progress Notes (Signed)
Patient here for lab visit only 

## 2016-10-26 MED ORDER — ALLOPURINOL 100 MG PO TABS: 100.0000 mg | ORAL_TABLET | Freq: Every day | ORAL | 2 refills | Status: DC

## 2016-10-26 MED FILL — ALLOPURINOL 100 MG TABLET: 100 | 30 days supply | Qty: 30 | Fill #0

## 2016-10-26 NOTE — Telephone Encounter (Signed)
Please call: Allopurinol 100mg  qday started for high levels of uric acid, which may be causing gout. Need to watch his kidney function closely since he does have stage 3 ckd.   Will rchk labs in 1 month, future orders placed.   Needs tight control of blood pressure to prevent further progression of kidney damage.  (Of note, crcl =57 mL/min Creatinine clearance, original Cockcroft-Gault)

## 2016-10-26 NOTE — Telephone Encounter (Signed)
Contacted pt to make aware of the medication and Dr. Janne Napoleon response pt is aware and will have his levels recheck in one month

## 2016-11-01 ENCOUNTER — Telehealth: Payer: Self-pay | Admitting: Internal Medicine

## 2016-11-01 NOTE — Telephone Encounter (Signed)
Dr. Durene Cal over medical clearance ofn the 11th of this month...Dr. Is awaiting follow up on medical clearance for BP check....please follow up

## 2016-11-02 ENCOUNTER — Telehealth: Payer: Self-pay | Admitting: *Deleted

## 2016-11-02 NOTE — Telephone Encounter (Signed)
Will forward to pcp

## 2016-11-02 NOTE — Telephone Encounter (Signed)
Per Dr. Janne Napoleon, Please schedule apt with her.  In 2-3 weeks.

## 2016-11-02 NOTE — Telephone Encounter (Signed)
Called pt. And scheduled appt. For 11/07/16.

## 2016-11-02 NOTE — Telephone Encounter (Signed)
JayA - please check on this, I dont recall ever getting ppwk for med clearance.  Need to call ortho to have them resend it, when and what surgery is planned?  Need to get Mr Mollenkopf in to see me soon to do ppwk/and medically clear him for surgery. thanks

## 2016-11-03 NOTE — Telephone Encounter (Signed)
Contacted [facility] and lvm for [employer] regarding medical clearance paperwork also to see what kind of surgery he is having and when is he suppose to have it Left phone number and fax number

## 2016-11-04 ENCOUNTER — Ambulatory Visit (INDEPENDENT_AMBULATORY_CARE_PROVIDER_SITE_OTHER): Payer: Medicaid Other | Admitting: Orthopaedic Surgery

## 2016-11-04 ENCOUNTER — Encounter (INDEPENDENT_AMBULATORY_CARE_PROVIDER_SITE_OTHER): Payer: Self-pay | Admitting: Orthopaedic Surgery

## 2016-11-04 VITALS — BP 162/95 | HR 77 | Ht 72.0 in | Wt 223.0 lb

## 2016-11-04 DIAGNOSIS — E790 Hyperuricemia without signs of inflammatory arthritis and tophaceous disease: Secondary | ICD-10-CM

## 2016-11-04 NOTE — Progress Notes (Signed)
Office Visit Note   Patient: Edward [name]           Date of Birth: [birth year]           MRN: [id number] Visit Date: 11/04/2016              Requested by: Maren Reamer, MD [address] [place], [place] [address] PCP: Maren Reamer, MD   Assessment & Plan: Visit Diagnoses:  1. Hyperuricemia            With bilateral knee pain and bilateral shoulder pain.  Plan: With patient's history of renal disease likely secondary to diabetes he likely is having some gout symptoms with multiple joint complaints. She's been on enalapril 100 mg daily. He may be a candidate for colcrys treatment. He has a medical appointment tomorrow we discussed the importance of better diabetic control. Follow-up when necessary.  Follow-Up Instructions: Return if symptoms worsen or fail to improve.   Orders:  No orders of the defined types were placed in this encounter.  No orders of the defined types were placed in this encounter.     Procedures: No procedures performed   Clinical Data: No additional findings.   Subjective: Chief Complaint  Patient presents with  . Follow-up    Patient is here to discuss lab results.  Patient states still about the same, bilateral knee and bilateral shoulder pain.  Currently taking Tylenol #3 and Ibuprofen.  Uric acid on 10/21/2016 was 8.2. Her mentation rate was 45. Rheumatoid factor and ANA were negative.  Review of Systems 14 point review of systems updated from 10/21/2016 and are unchanged. 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 Positive for uncontrolled diabetes bipolar positive for renal disease positive .   Objective: Vital Signs: BP (!) 162/95   Pulse 77   Ht 6' (1.829 m)   Wt 223 lb (101.2 kg)   BMI 30.24 kg/m   Physical Exam  Constitutional: He is oriented to person, place, and time. He appears well-developed and well-nourished.  HENT:  Head: Normocephalic and atraumatic.  Eyes: EOM are normal.  Pupils are equal, round, and reactive to light.  Reason visual acuity.  Neck: No tracheal deviation present. No thyromegaly present.  Cardiovascular: Normal rate.   Pulmonary/Chest: Effort normal. He has no wheezes.  Abdominal: Soft. Bowel sounds are normal.  Musculoskeletal:  Decreased sensation to his the both ankles and feet. Mild knee effusion right and left pain with range of motion of shoulders pain with range of motion of his knees tract negative drop arm test.  Neurological: He is alert and oriented to person, place, and time.  Skin: Skin is warm and dry. Capillary refill takes less than 2 seconds.  Psychiatric: He has a normal mood and affect. His behavior is normal. Judgment and thought content normal.    Ortho Exam  Specialty Comments:  No specialty comments available.  Imaging: No results found.   PMFS History: Patient Active Problem List   Diagnosis Date Noted  . Graves disease 03/02/2016  . Non compliance with medical treatment 11/26/2015  . Uncontrolled hypertension 11/26/2015  . Acute kidney injury superimposed on chronic kidney disease ([facility]) 11/25/2015  . Chest pain at rest 11/25/2015  . Hyperkalemia 11/25/2015  . Diabetes mellitus type 2, uncontrolled, with complications ([facility]) 85/88/5027   Past Medical History:  Diagnosis Date  . Anemia   . Arthritis   . Chronic kidney disease   . Diabetes mellitus   .  Graves disease 2014  . Headache   . Hypertension   . Non-compliance   . Shortness of breath dyspnea     Family History  Problem Relation Age of Onset  . Hypertension Mother   . Diabetes Mother     Past Surgical History:  Procedure Laterality Date  . CIRCUMCISION     as a child, around 71 years of age  . EYE SURGERY    . PARS PLANA VITRECTOMY Right 03/25/2016   Procedure: PARS PLANA VITRECTOMY 25 GAUGE FOR ENDOPHTHALMITIS;  Surgeon: Jalene Mullet, MD;  Location: [facility];  Service: Ophthalmology;  Laterality: Right;  . WISDOM TOOTH EXTRACTION      Social History   Occupational History  . Not on file.   Social History Main Topics  . Smoking status: Former Smoker    Packs/day: 0.75    Years: 3.00    Types: Cigarettes    Quit date: 05/28/2000  . Smokeless tobacco: Never Used  . Alcohol use 0.0 oz/week     Comment: 1 beer every night  . Drug use: No  . Sexual activity: Not on file

## 2016-11-07 ENCOUNTER — Other Ambulatory Visit: Payer: Self-pay

## 2016-11-07 ENCOUNTER — Encounter: Payer: Self-pay | Admitting: Internal Medicine

## 2016-11-07 ENCOUNTER — Ambulatory Visit: Payer: Medicaid Other | Attending: Internal Medicine | Admitting: Internal Medicine

## 2016-11-07 VITALS — BP 179/101 | HR 82 | Temp 97.9°F | Resp 16 | Wt 233.2 lb

## 2016-11-07 DIAGNOSIS — I129 Hypertensive chronic kidney disease with stage 1 through stage 4 chronic kidney disease, or unspecified chronic kidney disease: Secondary | ICD-10-CM | POA: Insufficient documentation

## 2016-11-07 DIAGNOSIS — E1122 Type 2 diabetes mellitus with diabetic chronic kidney disease: Secondary | ICD-10-CM | POA: Insufficient documentation

## 2016-11-07 DIAGNOSIS — I10 Essential (primary) hypertension: Secondary | ICD-10-CM | POA: Diagnosis not present

## 2016-11-07 DIAGNOSIS — IMO0002 Reserved for concepts with insufficient information to code with codable children: Secondary | ICD-10-CM

## 2016-11-07 DIAGNOSIS — Z79899 Other long term (current) drug therapy: Secondary | ICD-10-CM | POA: Insufficient documentation

## 2016-11-07 DIAGNOSIS — E0500 Thyrotoxicosis with diffuse goiter without thyrotoxic crisis or storm: Secondary | ICD-10-CM | POA: Insufficient documentation

## 2016-11-07 DIAGNOSIS — E1165 Type 2 diabetes mellitus with hyperglycemia: Secondary | ICD-10-CM | POA: Insufficient documentation

## 2016-11-07 DIAGNOSIS — E790 Hyperuricemia without signs of inflammatory arthritis and tophaceous disease: Secondary | ICD-10-CM

## 2016-11-07 DIAGNOSIS — N183 Chronic kidney disease, stage 3 unspecified: Secondary | ICD-10-CM

## 2016-11-07 DIAGNOSIS — Z01810 Encounter for preprocedural cardiovascular examination: Secondary | ICD-10-CM

## 2016-11-07 DIAGNOSIS — E118 Type 2 diabetes mellitus with unspecified complications: Secondary | ICD-10-CM

## 2016-11-07 LAB — GLUCOSE, POCT (MANUAL RESULT ENTRY): POC GLUCOSE: 89 mg/dL (ref 70–99)

## 2016-11-07 MED ORDER — SILDENAFIL CITRATE 100 MG PO TABS: 100.0000 mg | ORAL_TABLET | Freq: Every day | ORAL | 2 refills | Status: DC | PRN

## 2016-11-07 NOTE — Progress Notes (Signed)
Edward [name], is a 46 y.o. male  ZMC:[id number]  [id number]  DOB - [birth year]  Chief Complaint  Patient presents with  . Medical Clearance        Subjective:   Edward [name] is a 46 y.o. male here today for a follow up visit w/ hx of htn, ckd3 and dm.  Here for preop clearance for oral-max surg. He denies chest pain/exertional chest pain/syconpe/orthopnea.  He was last seen 09/19/16. He was seen by his Endo Dr Shellia Cleverly 09/20/16 and was had methotrexate increased to 26m qam and 53mqpm. And metoprolol xl 50 qd was started as well - but he was not sure if he was suppose to take or not, so did not start.  He was recently started on allopurinol 100 qd for elevated uric acid, but he has not started it either.  Co of ED, despite taking viagra 5060moses (double the dose)  Patient has No headache, No chest pain, No abdominal pain - No Nausea, No new weakness tingling or numbness, No Cough - SOB.  No problems updated.  ALLERGIES: No Known Allergies  PAST MEDICAL HISTORY: Past Medical History:  Diagnosis Date  . Anemia   . Arthritis   . Chronic kidney disease   . Diabetes mellitus   . Graves disease 2014  . Headache   . Hypertension   . Non-compliance   . Shortness of breath dyspnea     MEDICATIONS AT HOME: Prior to Admission medications   Medication Sig Start Date End Date Taking? Authorizing Provider  acetaminophen (TYLENOL) 325 MG tablet Take 650 mg by mouth every 6 (six) hours as needed for mild pain.    Historical Provider, MD  acetaminophen-codeine (TYLENOL #3) 300-30 MG tablet Take 1 tablet by mouth every 4 (four) hours as needed for moderate pain or severe pain. 09/19/16   DawMaren ReamerD  allopurinol (ZYLOPRIM) 100 MG tablet Take 1 tablet (100 mg total) by mouth daily. 10/26/16   DawMaren ReamerD  amLODipine (NORVASC) 10 MG tablet Take 1 tablet (10 mg total) by mouth daily. 09/19/16   DawMaren ReamerD  Blood Glucose Monitoring Suppl (TRUE  METRIX METER) W/DEVICE KIT Check blood sugar before meals and at bedtime 05/29/15   ValLance BoschP  cyclobenzaprine (FLEXERIL) 5 MG tablet Take 1 tablet (5 mg total) by mouth 3 (three) times daily as needed for muscle spasms. 03/28/16   DawMaren ReamerD  glucose blood (TRUE METRIX BLOOD GLUCOSE TEST) test strip Use as instructed 05/29/15   ValLance BoschP  glyBURIDE (DIABETA) 5 MG tablet Take 1 tablet (5 mg total) by mouth daily with breakfast. 09/19/16   DawMaren ReamerD  HYDROcodone-acetaminophen (NORCO/VICODIN) 5-325 MG tablet Take 1-2 tablets by mouth every 4 (four) hours as needed. 06/20/16   KelRecardo EvangelistA-C  methimazole (TAPAZOLE) 5 MG tablet Take by mouth 10 mg in am and 5 mg in pm 09/20/16   CriPhilemon KingdomD  metoprolol succinate (TOPROL XL) 50 MG 24 hr tablet Take 1 tablet (50 mg total) by mouth daily. Take with or immediately following a meal. 09/20/16   CriPhilemon KingdomD  NONFORMULARY OR COMPOUNDED ITEM Apply 1-2 g topically 4 (four) times daily. Peripheral Neuropathy cream: Bupivacaine 1% Doxepin 3% Gabapentin 6% Ibuprofen 3% Pentoxifylline 3% 10/17/16   BreEdrick KinsPM  ofloxacin (OCUFLOX) 0.3 % ophthalmic solution Place 1 drop into the right eye 4 (four) times daily. 03/22/16   Historical  Provider, MD  pravastatin (PRAVACHOL) 20 MG tablet Take 1 tablet (20 mg total) by mouth daily. 09/20/16   Maren Reamer, MD  sildenafil (VIAGRA) 100 MG tablet Take 1 tablet (100 mg total) by mouth daily as needed for erectile dysfunction. If no effect, may double dose next day/time 11/07/16   Maren Reamer, MD  traMADol (ULTRAM) 50 MG tablet Take 1 tablet (50 mg total) by mouth every 8 (eight) hours as needed for severe pain. 03/28/16   Maren Reamer, MD  TRUEPLUS LANCETS 28G MISC Check blood sugar before meals and at bedtime 05/29/15   Lance Bosch, NP  VOLTAREN 1 % GEL Apply 2 g topically 4 (four) times daily. 10/04/16   Maren Reamer, MD     Objective:   Vitals:    11/07/16 1636  BP: (!) 179/101  Pulse: 82  Resp: 16  Temp: 97.9 F (36.6 C)  TempSrc: Oral  SpO2: 99%  Weight: 233 lb 3.2 oz (105.8 kg)    Exam General appearance : Awake, alert, not in any distress. Speech Clear. Not toxic looking, pleasant. HEENT: Atraumatic and Normocephalic, pupils equally reactive to light. Neck: supple, no JVD. No cervical lymphadenopathy.  Chest:Good air entry bilaterally, no added sounds. CVS: S1 S2 regular, no murmurs/gallups or rubs. Abdomen: Bowel sounds active, Non tender and not distended with no gaurding, rigidity or rebound. Extremities: B/L Lower Ext shows no edema, both legs are warm to touch Neurology: Awake alert, and oriented X 3, CN II-XII grossly intact, Non focal Skin:No Rash  Data Review Lab Results  Component Value Date   HGBA1C 7.2 09/19/2016   HGBA1C 5.2 03/21/2016   HGBA1C 8.2 (H) 11/25/2015    Depression screen PHQ 2/9 11/07/2016 09/19/2016 03/21/2016 12/08/2015 05/05/2015  Decreased Interest _0 0 3  Down, Depressed, Hopeless _1 0 2  PHQ - 2 Score _2 0 5  Altered sleeping 3 1 0 - 1  Tired, decreased energy 3 3 0 - 3  Change in appetite 2 2 0 - 2  Feeling bad or failure about yourself  0 1 1 - 1  Trouble concentrating 2 (No Data) 3 - 0  Moving slowly or fidgety/restless 0 3 0 - 1  Suicidal thoughts 0 0 0 - 0  PHQ-9 Score _3 - 13    ekg 11/07/16 nsr 81bpm, lad, no acute stemi, twi v5 -v6 noted  Assessment & Plan   1. Accelerated hypertension Uncontrolled, advised strict low salt diet. - continue norvasc, advised to start taking toprol xl 50 qd - rn Travia repeat bp check in 2 wks. Carilyn Goodpasture - please tell me what his bp is, hopefully can clear him for surgery if better. Thanks.  2. Preop cardiovascular exam - needs bp under better control prior to clearance - EKG 12-Lead - no acute findings.  3. Uncontrolled type 2 diabetes mellitus with complication, without long-term current use of insulin (HCC) a1c dec 7.2,  urge better diet/med compliance, increase exercise recd. - POCT glucose (manual entry) - no changes on meds today. - continue pravastatin 20 qhs  4. Ed - trial viagra 129m prn - dw w/ him that ooc htn and dm does not help this, certainly bb will have some adverse affects on ED as well  5. Grave's dz Per endocrine, on methimazole, and now on bb.  6. Elevated uric acid levels, w/ likely Gout - allopurinol 1022m Qd for now, watch renal function  7. ckd 3 - will rechk bmp when returns to clinic in 2 wks.  Patient have been counseled extensively about nutrition and exercise  Return in about 3 months (around 02/04/2017), or if symptoms worsen or fail to improve.  The patient was given clear instructions to go to ER or return to medical center if symptoms don't improve, worsen or new problems develop. The patient verbalized understanding. The patient was told to call to get lab results if they haven't heard anything in the next week.   This note has been created with Surveyor, quantity. Any transcriptional errors are unintentional.   Maren Reamer, MD, [facility] [place], [place]   11/07/2016, 5:10 PM

## 2016-11-07 NOTE — Patient Instructions (Addendum)
- repeat bp w/ [facility] 2 wks. RN - lab visit at same time -   Low-Sodium Eating Plan Sodium raises blood pressure and causes water to be held in the body. Getting less sodium from food will help lower your blood pressure, reduce any swelling, and protect your heart, liver, and kidneys. We get sodium by adding salt (sodium chloride) to food. Most of our sodium comes from canned, boxed, and frozen foods. Restaurant foods, fast foods, and pizza are also very high in sodium. Even if you take medicine to lower your blood pressure or to reduce fluid in your body, getting less sodium from your food is important. What is my plan? Most people should limit their sodium intake to 2,300 mg a day. Your health care provider recommends that you limit your sodium intake to 000mg  a day. What do I need to know about this eating plan? For the low-sodium eating plan, you will follow these general guidelines:  Choose foods with a % Daily Value for sodium of less than 5% (as listed on the food label).  Use salt-free seasonings or herbs instead of table salt or sea salt.  Check with your health care provider or pharmacist before using salt substitutes.  Eat fresh foods.  Eat more vegetables and fruits.  Limit canned vegetables. If you do use them, rinse them well to decrease the sodium.  Limit cheese to 1 oz (28 g) per day.  Eat lower-sodium products, often labeled as "lower sodium" or "no salt added."  Avoid foods that contain monosodium glutamate (MSG). MSG is sometimes added to [place] food and some canned foods.  Check food labels (Nutrition Facts labels) on foods to learn how much sodium is in one serving.  Eat more home-cooked food and less restaurant, buffet, and fast food.  When eating at a restaurant, ask that your food be prepared with less salt, or no salt if possible. How do I read food labels for sodium information? The Nutrition Facts label lists the amount of sodium in one serving of  the food. If you eat more than one serving, you must multiply the listed amount of sodium by the number of servings. Food labels may also identify foods as:  Sodium free-Less than 5 mg in a serving.  Very low sodium-35 mg or less in a serving.  Low sodium-140 mg or less in a serving.  Light in sodium-50% less sodium in a serving. For example, if a food that usually has 300 mg of sodium is changed to become light in sodium, it will have 150 mg of sodium.  Reduced sodium-25% less sodium in a serving. For example, if a food that usually has 400 mg of sodium is changed to reduced sodium, it will have 300 mg of sodium. What foods can I eat? Grains  Low-sodium cereals, including oats, puffed wheat and rice, and shredded wheat cereals. Low-sodium crackers. Unsalted rice and pasta. Lower-sodium bread. Vegetables  Frozen or fresh vegetables. Low-sodium or reduced-sodium canned vegetables. Low-sodium or reduced-sodium tomato sauce and paste. Low-sodium or reduced-sodium tomato and vegetable juices. Fruits  Fresh, frozen, and canned fruit. Fruit juice. Meat and Other Protein Products  Low-sodium canned tuna and salmon. Fresh or frozen meat, poultry, seafood, and fish. Lamb. Unsalted nuts. Dried beans, peas, and lentils without added salt. Unsalted canned beans. Homemade soups without salt. Eggs. Dairy  Milk. Soy milk. Ricotta cheese. Low-sodium or reduced-sodium cheeses. Yogurt. Condiments  Fresh and dried herbs and spices. Salt-free seasonings. Onion and garlic powders.  Low-sodium varieties of mustard and ketchup. Fresh or refrigerated horseradish. Lemon juice. Fats and Oils  Reduced-sodium salad dressings. Unsalted butter. Other  Unsalted popcorn and pretzels. The items listed above may not be a complete list of recommended foods or beverages. Contact your dietitian for more options.  What foods are not recommended? Grains  Instant hot cereals. Bread stuffing, pancake, and biscuit mixes.  Croutons. Seasoned rice or pasta mixes. Noodle soup cups. Boxed or frozen macaroni and cheese. Self-rising flour. Regular salted crackers. Vegetables  Regular canned vegetables. Regular canned tomato sauce and paste. Regular tomato and vegetable juices. Frozen vegetables in sauces. Salted [place] fries. Olives. Edward [name]. Relishes. Sauerkraut. Salsa. Meat and Other Protein Products  Salted, canned, smoked, spiced, or pickled meats, seafood, or fish. Bacon, ham, sausage, hot dogs, corned beef, chipped beef, and packaged luncheon meats. Salt pork. Jerky. Pickled herring. Anchovies, regular canned tuna, and sardines. Salted nuts. Dairy  Processed cheese and cheese spreads. Cheese curds. Blue cheese and cottage cheese. Buttermilk. Condiments  Onion and garlic salt, seasoned salt, table salt, and sea salt. Canned and packaged gravies. Worcestershire sauce. Tartar sauce. Barbecue sauce. Teriyaki sauce. Soy sauce, including reduced sodium. Steak sauce. Fish sauce. Oyster sauce. Cocktail sauce. Horseradish that you find on the shelf. Regular ketchup and mustard. Meat flavorings and tenderizers. Bouillon cubes. Hot sauce. Tabasco sauce. Marinades. Taco seasonings. Relishes. Fats and Oils  Regular salad dressings. Salted butter. Margarine. Ghee. Bacon fat. Other  Potato and tortilla chips. Corn chips and puffs. Salted popcorn and pretzels. Canned or dried soups. Pizza. Frozen entrees and pot pies. The items listed above may not be a complete list of foods and beverages to avoid. Contact your dietitian for more information.  This information is not intended to replace advice given to you by your health care provider. Make sure you discuss any questions you have with your health care provider. Document Released: 03/11/2002 Document Revised: 02/25/2016 Document Reviewed: 07/24/2013 Elsevier Interactive Patient Education  2017 [place].  -  Gout Introduction Gout is painful swelling that can happen in some  of your joints. Gout is a type of arthritis. This condition is caused by having too much uric acid in your body. Uric acid is a chemical that is made when your body breaks down substances called purines. If your body has too much uric acid, sharp crystals can form and build up in your joints. This causes pain and swelling. Gout attacks can happen quickly and be very painful (acute gout). Over time, the attacks can affect more joints and happen more often (chronic gout). Follow these instructions at home: During a Gout Attack  If directed, put ice on the painful area:  Put ice in a plastic bag.  Place a towel between your skin and the bag.  Leave the ice on for 20 minutes, 2-3 times a day.  Rest the joint as much as possible. If the joint is in your leg, you may be given crutches to use.  Raise (elevate) the painful joint above the level of your heart as often as you can.  Drink enough fluids to keep your pee (urine) clear or pale yellow.  Take over-the-counter and prescription medicines only as told by your doctor.  Do not drive or use heavy machinery while taking prescription pain medicine.  Follow instructions from your doctor about what you can or cannot eat and drink.  Return to your normal activities as told by your doctor. Ask your doctor what activities are safe for you.  Avoiding Future Gout Attacks  Follow a low-purine diet as told by a specialist (dietitian) or your doctor. Avoid foods and drinks that have a lot of purines, such as:  Liver.  Kidney.  Anchovies.  Asparagus.  Herring.  Mushrooms  Mussels.  Beer.  Limit alcohol intake to no more than 1 drink a day for nonpregnant women and 2 drinks a day for men. One drink equals 12 oz of beer, 5 oz of wine, or 1 oz of hard liquor.  Stay at a healthy weight or lose weight if you are overweight. If you want to lose weight, talk with your doctor. It is important that you do not lose weight too fast.  Start or  continue an exercise plan as told by your doctor.  Drink enough fluids to keep your pee clear or pale yellow.  Take over-the-counter and prescription medicines only as told by your doctor.  Keep all follow-up visits as told by your doctor. This is important. Contact a doctor if:  You have another gout attack.  You still have symptoms of a gout attack after10 days of treatment.  You have problems (side effects) because of your medicines.  You have chills or a fever.  You have burning pain when you pee (urinate).  You have pain in your lower back or belly. Get help right away if:  You have very bad pain.  Your pain cannot be controlled.  You cannot pee. This information is not intended to replace advice given to you by your health care provider. Make sure you discuss any questions you have with your health care provider. Document Released: 06/28/2008 Document Revised: 02/25/2016 Document Reviewed: 07/02/2015  2017 Elsevier  - Diabetes Mellitus and Food It is important for you to manage your blood sugar (glucose) level. Your blood glucose level can be greatly affected by what you eat. Eating healthier foods in the appropriate amounts throughout the day at about the same time each day will help you control your blood glucose level. It can also help slow or prevent worsening of your diabetes mellitus. Healthy eating may even help you improve the level of your blood pressure and reach or maintain a healthy weight. General recommendations for healthful eating and cooking habits include:  Eating meals and snacks regularly. Avoid going long periods of time without eating to lose weight.  Eating a diet that consists mainly of plant-based foods, such as fruits, vegetables, nuts, legumes, and whole grains.  Using low-heat cooking methods, such as baking, instead of high-heat cooking methods, such as deep frying. Work with your dietitian to make sure you understand how to use the  Nutrition Facts information on food labels. How can food affect me? Carbohydrates  Carbohydrates affect your blood glucose level more than any other type of food. Your dietitian will help you determine how many carbohydrates to eat at each meal and teach you how to count carbohydrates. Counting carbohydrates is important to keep your blood glucose at a healthy level, especially if you are using insulin or taking certain medicines for diabetes mellitus. Alcohol  Alcohol can cause sudden decreases in blood glucose (hypoglycemia), especially if you use insulin or take certain medicines for diabetes mellitus. Hypoglycemia can be a life-threatening condition. Symptoms of hypoglycemia (sleepiness, dizziness, and disorientation) are similar to symptoms of having too much alcohol. If your health care provider has given you approval to drink alcohol, do so in moderation and use the following guidelines:  Women should not have more than one  drink per day, and men should not have more than two drinks per day. One drink is equal to:  12 oz of beer.  5 oz of wine.  1 oz of hard liquor.  Do not drink on an empty stomach.  Keep yourself hydrated. Have water, diet soda, or unsweetened iced tea.  Regular soda, juice, and other mixers might contain a lot of carbohydrates and should be counted. What foods are not recommended? As you make food choices, it is important to remember that all foods are not the same. Some foods have fewer nutrients per serving than other foods, even though they might have the same number of calories or carbohydrates. It is difficult to get your body what it needs when you eat foods with fewer nutrients. Examples of foods that you should avoid that are high in calories and carbohydrates but low in nutrients include:  Trans fats (most processed foods list trans fats on the Nutrition Facts label).  Regular soda.  Juice.  Candy.  Sweets, such as cake, pie, doughnuts, and  cookies.  Fried foods. What foods can I eat? Eat nutrient-rich foods, which will nourish your body and keep you healthy. The food you should eat also will depend on several factors, including:  The calories you need.  The medicines you take.  Your weight.  Your blood glucose level.  Your blood pressure level.  Your cholesterol level. You should eat a variety of foods, including:  Protein.  Lean cuts of meat.  Proteins low in saturated fats, such as fish, egg whites, and beans. Avoid processed meats.  Fruits and vegetables.  Fruits and vegetables that may help control blood glucose levels, such as apples, mangoes, and yams.  Dairy products.  Choose fat-free or low-fat dairy products, such as milk, yogurt, and cheese.  Grains, bread, pasta, and rice.  Choose whole grain products, such as multigrain bread, whole oats, and brown rice. These foods may help control blood pressure.  Fats.  Foods containing healthful fats, such as nuts, avocado, olive oil, canola oil, and fish. Does everyone with diabetes mellitus have the same meal plan? Because every person with diabetes mellitus is different, there is not one meal plan that works for everyone. It is very important that you meet with a dietitian who will help you create a meal plan that is just right for you. This information is not intended to replace advice given to you by your health care provider. Make sure you discuss any questions you have with your health care provider. Document Released: 06/16/2005 Document Revised: 02/25/2016 Document Reviewed: 08/16/2013 Elsevier Interactive Patient Education  2017 [employer]   -  Diabetes Mellitus and Exercise Exercising regularly is important for your overall health, especially when you have diabetes (diabetes mellitus). Exercising is not only about losing weight. It has many health benefits, such as increasing muscle strength and bone density and reducing body fat and  stress. This leads to improved fitness, flexibility, and endurance, all of which result in better overall health. Exercise has additional benefits for people with diabetes, including:  Reducing appetite.  Helping to lower and control blood glucose.  Lowering blood pressure.  Helping to control amounts of fatty substances (lipids) in the blood, such as cholesterol and triglycerides.  Helping the body to respond better to insulin (improving insulin sensitivity).  Reducing how much insulin the body needs.  Decreasing the risk for heart disease by:  Lowering cholesterol and triglyceride levels.  Increasing the levels of good cholesterol.  Lowering blood glucose levels. What is my activity plan? Your health care provider or certified diabetes educator can help you make a plan for the type and frequency of exercise (activity plan) that works for you. Make sure that you:  Do at least 150 minutes of moderate-intensity or vigorous-intensity exercise each week. This could be brisk walking, biking, or water aerobics.  Do stretching and strength exercises, such as yoga or weightlifting, at least 2 times a week.  Spread out your activity over at least 3 days of the week.  Get some form of physical activity every day.  Do not go more than 2 days in a row without some kind of physical activity.  Avoid being inactive for more than 90 minutes at a time. Take frequent breaks to walk or stretch.  Choose a type of exercise or activity that you enjoy, and set realistic goals.  Start slowly, and gradually increase the intensity of your exercise over time. What do I need to know about managing my diabetes?  Check your blood glucose before and after exercising.  If your blood glucose is higher than 240 mg/dL (13.3 mmol/L) before you exercise, check your urine for ketones. If you have ketones in your urine, do not exercise until your blood glucose returns to normal.  Know the symptoms of low  blood glucose (hypoglycemia) and how to treat it. Your risk for hypoglycemia increases during and after exercise. Common symptoms of hypoglycemia can include:  Hunger.  Anxiety.  Sweating and feeling clammy.  Confusion.  Dizziness or feeling light-headed.  Increased heart rate or palpitations.  Blurry vision.  Tingling or numbness around the mouth, lips, or tongue.  Tremors or shakes.  Irritability.  Keep a rapid-acting carbohydrate snack available before, during, and after exercise to help prevent or treat hypoglycemia.  Avoid injecting insulin into areas of the body that are going to be exercised. For example, avoid injecting insulin into:  The arms, when playing tennis.  The legs, when jogging.  Keep records of your exercise habits. Doing this can help you and your health care provider adjust your diabetes management plan as needed. Write down:  Food that you eat before and after you exercise.  Blood glucose levels before and after you exercise.  The type and amount of exercise you have done.  When your insulin is expected to peak, if you use insulin. Avoid exercising at times when your insulin is peaking.  When you start a new exercise or activity, work with your health care provider to make sure the activity is safe for you, and to adjust your insulin, medicines, or food intake as needed.  Drink plenty of water while you exercise to prevent dehydration or heat stroke. Drink enough fluid to keep your urine clear or pale yellow. This information is not intended to replace advice given to you by your health care provider. Make sure you discuss any questions you have with your health care provider. Document Released: 12/10/2003 Document Revised: 04/08/2016 Document Reviewed: 02/29/2016 Elsevier Interactive Patient Education  2017 [employer].

## 2016-11-10 ENCOUNTER — Encounter: Payer: Self-pay | Admitting: Internal Medicine

## 2016-11-10 LAB — HM DIABETES EYE EXAM

## 2016-11-17 ENCOUNTER — Telehealth: Payer: Self-pay | Admitting: Internal Medicine

## 2016-11-17 NOTE — Telephone Encounter (Signed)
Caller requesting verbal auth for visits and for surgery referral. Please f/u with [facility]. Thank you

## 2016-11-17 NOTE — Telephone Encounter (Signed)
Contacted [facility] to give verbal order per Dr. Doreene Burke. Spoke with Kathlee Nations and per Kathlee Nations they want to see the patient before and after gave the okay

## 2016-11-21 ENCOUNTER — Ambulatory Visit: Payer: Medicaid Other | Admitting: Internal Medicine

## 2016-11-22 NOTE — Telephone Encounter (Signed)
Edward [name] - is this closed? Please clarify. Do we need to see pt b/4 after or is this just an fyi about [facility]? thanks

## 2016-11-22 NOTE — Telephone Encounter (Signed)
It was just a FYI Dr. Doreene Burke gave the verbal order for them to see the patient before and after surgery

## 2016-11-24 MED FILL — PREDNISOLONE AC 1% EYE DROP: 1 | 28 days supply | Qty: 10 | Fill #0

## 2016-11-24 MED FILL — OFLOXACIN 0.3% EYE DROPS: 0.3 | 25 days supply | Qty: 5 | Fill #0

## 2016-11-30 MED FILL — acetaZOLAMIDE 250 MG TABS: 250 | 15 days supply | Qty: 30 | Fill #0

## 2016-12-06 ENCOUNTER — Ambulatory Visit: Payer: Medicaid Other | Attending: Internal Medicine | Admitting: *Deleted

## 2016-12-06 DIAGNOSIS — Z01818 Encounter for other preprocedural examination: Secondary | ICD-10-CM | POA: Insufficient documentation

## 2016-12-06 DIAGNOSIS — Z79899 Other long term (current) drug therapy: Secondary | ICD-10-CM | POA: Diagnosis not present

## 2016-12-06 DIAGNOSIS — I10 Essential (primary) hypertension: Secondary | ICD-10-CM | POA: Diagnosis not present

## 2016-12-06 NOTE — Progress Notes (Signed)
Pt here for f/u BP check after office visit on 11/17/16 with Dr.Langland. Pt denies chest pain or SOB.  Visual difficulties are at baseline, no new concerns.   Has been taking medications as prescribed. Blood pressure taken manually while patient is sitting is148/90. Will notify patient's provider of blood pressure.Carilyn Goodpasture, RN, BSN

## 2016-12-06 NOTE — Progress Notes (Signed)
Discussed w/ RN during clinic today. bp much improved. Would advised if able to take bb and norvasc prior to surgical procedure due to very poorly controlled bp.  Surgical clearance note written.  Per current AHA/ACC guidelines, patient considered low risk for oral /max surgery and no further intervention recd at this time.  ekg 11/07/16 no actue st changes.

## 2016-12-08 ENCOUNTER — Ambulatory Visit: Payer: Medicaid Other | Attending: Internal Medicine

## 2016-12-19 ENCOUNTER — Encounter: Payer: Self-pay | Admitting: Internal Medicine

## 2016-12-19 ENCOUNTER — Ambulatory Visit: Payer: Medicaid Other | Attending: Internal Medicine | Admitting: Internal Medicine

## 2016-12-19 ENCOUNTER — Other Ambulatory Visit: Payer: Self-pay | Admitting: *Deleted

## 2016-12-19 VITALS — BP 161/89 | HR 75 | Temp 98.4°F | Resp 18 | Ht 72.0 in | Wt 224.0 lb

## 2016-12-19 DIAGNOSIS — E1165 Type 2 diabetes mellitus with hyperglycemia: Secondary | ICD-10-CM

## 2016-12-19 DIAGNOSIS — E0500 Thyrotoxicosis with diffuse goiter without thyrotoxic crisis or storm: Secondary | ICD-10-CM | POA: Diagnosis not present

## 2016-12-19 DIAGNOSIS — N183 Chronic kidney disease, stage 3 (moderate): Secondary | ICD-10-CM | POA: Insufficient documentation

## 2016-12-19 DIAGNOSIS — E1122 Type 2 diabetes mellitus with diabetic chronic kidney disease: Secondary | ICD-10-CM | POA: Diagnosis present

## 2016-12-19 DIAGNOSIS — G894 Chronic pain syndrome: Secondary | ICD-10-CM | POA: Diagnosis not present

## 2016-12-19 DIAGNOSIS — M25512 Pain in left shoulder: Secondary | ICD-10-CM | POA: Diagnosis not present

## 2016-12-19 DIAGNOSIS — K047 Periapical abscess without sinus: Secondary | ICD-10-CM | POA: Diagnosis not present

## 2016-12-19 DIAGNOSIS — I10 Essential (primary) hypertension: Secondary | ICD-10-CM

## 2016-12-19 DIAGNOSIS — E118 Type 2 diabetes mellitus with unspecified complications: Secondary | ICD-10-CM | POA: Diagnosis not present

## 2016-12-19 DIAGNOSIS — Z79899 Other long term (current) drug therapy: Secondary | ICD-10-CM | POA: Diagnosis not present

## 2016-12-19 DIAGNOSIS — IMO0002 Reserved for concepts with insufficient information to code with codable children: Secondary | ICD-10-CM

## 2016-12-19 DIAGNOSIS — I129 Hypertensive chronic kidney disease with stage 1 through stage 4 chronic kidney disease, or unspecified chronic kidney disease: Secondary | ICD-10-CM | POA: Insufficient documentation

## 2016-12-19 DIAGNOSIS — M109 Gout, unspecified: Secondary | ICD-10-CM | POA: Insufficient documentation

## 2016-12-19 LAB — BASIC METABOLIC PANEL WITH GFR
BUN: 36 mg/dL — AB (ref 7–25)
CALCIUM: 8.8 mg/dL (ref 8.6–10.3)
CO2: 20 mmol/L (ref 20–31)
CREATININE: 2.27 mg/dL — AB (ref 0.60–1.35)
Chloride: 112 mmol/L — ABNORMAL HIGH (ref 98–110)
GFR, EST NON AFRICAN AMERICAN: 34 mL/min — AB (ref >=60)
GFR, Est African American: 39 mL/min — ABNORMAL LOW (ref >=60)
Glucose, Bld: 101 mg/dL — ABNORMAL HIGH (ref 65–99)
Potassium: 5 mmol/L (ref 3.5–5.3)
Sodium: 141 mmol/L (ref 135–146)

## 2016-12-19 LAB — POCT GLYCOSYLATED HEMOGLOBIN (HGB A1C): HEMOGLOBIN A1C: 7.3

## 2016-12-19 LAB — GLUCOSE, POCT (MANUAL RESULT ENTRY): POC GLUCOSE: 117 mg/dL — AB (ref 70–99)

## 2016-12-19 MED ORDER — GLYBURIDE 5 MG PO TABS: 5.0000 mg | ORAL_TABLET | Freq: Every day | ORAL | 3 refills | Status: DC

## 2016-12-19 MED ORDER — METOPROLOL SUCCINATE ER 50 MG PO TB24: 50.0000 mg | ORAL_TABLET | Freq: Every day | ORAL | 3 refills | Status: DC

## 2016-12-19 MED ORDER — METHOCARBAMOL 500 MG PO TABS: 500.0000 mg | ORAL_TABLET | Freq: Three times a day (TID) | ORAL | 2 refills | Status: DC | PRN

## 2016-12-19 MED ORDER — PRAVASTATIN SODIUM 20 MG PO TABS
20.0000 mg | ORAL_TABLET | Freq: Every day | ORAL | 3 refills | Status: DC
Start: 2016-12-19 — End: 2017-07-18

## 2016-12-19 MED ORDER — TRAMADOL HCL 50 MG PO TABS: 50.0000 mg | ORAL_TABLET | Freq: Three times a day (TID) | ORAL | 0 refills | Status: DC | PRN

## 2016-12-19 MED ORDER — AMLODIPINE BESYLATE 10 MG PO TABS: 10.0000 mg | ORAL_TABLET | Freq: Every day | ORAL | 3 refills | Status: DC

## 2016-12-19 MED ORDER — GABAPENTIN 100 MG PO CAPS: 100.0000 mg | ORAL_CAPSULE | Freq: Three times a day (TID) | ORAL | 3 refills | Status: DC

## 2016-12-19 MED ORDER — ALLOPURINOL 100 MG PO TABS: 100.0000 mg | ORAL_TABLET | Freq: Every day | ORAL | 2 refills | Status: DC

## 2016-12-19 MED ORDER — SILDENAFIL CITRATE 100 MG PO TABS: 100.0000 mg | ORAL_TABLET | Freq: Every day | ORAL | 3 refills | Status: DC | PRN

## 2016-12-19 NOTE — Progress Notes (Signed)
Patient is here for Pain FU  Patient complains of body pain being present and scaled currently at a 7.  Patient has not taken medication today. Patient has eaten today.  Patient denies any suicidal ideations at this time.

## 2016-12-19 NOTE — Patient Instructions (Addendum)
Low-Sodium Eating Plan Sodium, which is an element that makes up salt, helps you maintain a healthy balance of fluids in your body. Too much sodium can increase your blood pressure and cause fluid and waste to be held in your body. Your health care provider or dietitian may recommend following this plan if you have high blood pressure (hypertension), kidney disease, liver disease, or heart failure. Eating less sodium can help lower your blood pressure, reduce swelling, and protect your heart, liver, and kidneys. What are tips for following this plan? General guidelines   Most people on this plan should limit their sodium intake to 1,500-2,000 mg (milligrams) of sodium each day. Reading food labels   The Nutrition Facts label lists the amount of sodium in one serving of the food. If you eat more than one serving, you must multiply the listed amount of sodium by the number of servings.  Choose foods with less than 140 mg of sodium per serving.  Avoid foods with 300 mg of sodium or more per serving. Shopping   Look for lower-sodium products, often labeled as "low-sodium" or "no salt added."  Always check the sodium content even if foods are labeled as "unsalted" or "no salt added".  Buy fresh foods.  Avoid canned foods and premade or frozen meals.  Avoid canned, cured, or processed meats  Buy breads that have less than 80 mg of sodium per slice. Cooking   Eat more home-cooked food and less restaurant, buffet, and fast food.  Avoid adding salt when cooking. Use salt-free seasonings or herbs instead of table salt or sea salt. Check with your health care provider or pharmacist before using salt substitutes.  Cook with plant-based oils, such as canola, sunflower, or olive oil. Meal planning   When eating at a restaurant, ask that your food be prepared with less salt or no salt, if possible.  Avoid foods that contain MSG (monosodium glutamate). MSG is sometimes added to [place] food,  bouillon, and some canned foods. What foods are recommended? The items listed may not be a complete list. Talk with your dietitian about what dietary choices are best for you. Grains  Low-sodium cereals, including oats, puffed wheat and rice, and shredded wheat. Low-sodium crackers. Unsalted rice. Unsalted pasta. Low-sodium bread. Whole-grain breads and whole-grain pasta. Vegetables  Fresh or frozen vegetables. "No salt added" canned vegetables. "No salt added" tomato sauce and paste. Low-sodium or reduced-sodium tomato and vegetable juice. Fruits  Fresh, frozen, or canned fruit. Fruit juice. Meats and other protein foods  Fresh or frozen (no salt added) meat, poultry, seafood, and fish. Low-sodium canned tuna and salmon. Unsalted nuts. Dried peas, beans, and lentils without added salt. Unsalted canned beans. Eggs. Unsalted nut butters. Dairy  Milk. Soy milk. Cheese that is naturally low in sodium, such as ricotta cheese, fresh mozzarella, or Swiss cheese Low-sodium or reduced-sodium cheese. Cream cheese. Yogurt. Fats and oils  Unsalted butter. Unsalted margarine with no trans fat. Vegetable oils such as canola or olive oils. Seasonings and other foods  Fresh and dried herbs and spices. Salt-free seasonings. Low-sodium mustard and ketchup. Sodium-free salad dressing. Sodium-free light mayonnaise. Fresh or refrigerated horseradish. Lemon juice. Vinegar. Homemade, reduced-sodium, or low-sodium soups. Unsalted popcorn and pretzels. Low-salt or salt-free chips. What foods are not recommended? The items listed may not be a complete list. Talk with your dietitian about what dietary choices are best for you. Grains  Instant hot cereals. Bread stuffing, pancake, and biscuit mixes. Croutons. Seasoned rice or pasta  mixes. Noodle soup cups. Boxed or frozen macaroni and cheese. Regular salted crackers. Self-rising flour. Vegetables  Sauerkraut, pickled vegetables, and relishes. Olives. [place] fries. Onion  rings. Regular canned vegetables (not low-sodium or reduced-sodium). Regular canned tomato sauce and paste (not low-sodium or reduced-sodium). Regular tomato and vegetable juice (not low-sodium or reduced-sodium). Frozen vegetables in sauces. Meats and other protein foods  Meat or fish that is salted, canned, smoked, spiced, or pickled. Bacon, ham, sausage, hotdogs, corned beef, chipped beef, packaged lunch meats, salt pork, jerky, pickled herring, anchovies, regular canned tuna, sardines, salted nuts. Dairy  Processed cheese and cheese spreads. Cheese curds. Blue cheese. Feta cheese. String cheese. Regular cottage cheese. Buttermilk. Canned milk. Fats and oils  Salted butter. Regular margarine. Ghee. Bacon fat. Seasonings and other foods  Onion salt, garlic salt, seasoned salt, table salt, and sea salt. Canned and packaged gravies. Worcestershire sauce. Tartar sauce. Barbecue sauce. Teriyaki sauce. Soy sauce, including reduced-sodium. Steak sauce. Fish sauce. Oyster sauce. Cocktail sauce. Horseradish that you find on the shelf. Regular ketchup and mustard. Meat flavorings and tenderizers. Bouillon cubes. Hot sauce and Tabasco sauce. Premade or packaged marinades. Premade or packaged taco seasonings. Relishes. Regular salad dressings. Salsa. Potato and tortilla chips. Corn chips and puffs. Salted popcorn and pretzels. Canned or dried soups. Pizza. Frozen entrees and pot pies. Summary  Eating less sodium can help lower your blood pressure, reduce swelling, and protect your heart, liver, and kidneys.  Most people on this plan should limit their sodium intake to 1,500-2,000 mg (milligrams) of sodium each day.  Canned, boxed, and frozen foods are high in sodium. Restaurant foods, fast foods, and pizza are also very high in sodium. You also get sodium by adding salt to food.  Try to cook at home, eat more fresh fruits and vegetables, and eat less fast food, canned, processed, or prepared foods. This  information is not intended to replace advice given to you by your health care provider. Make sure you discuss any questions you have with your health care provider. Document Released: 03/11/2002 Document Revised: 09/12/2016 Document Reviewed: 09/12/2016 Elsevier Interactive Patient Education  2017 [employer]  -  Diabetic Neuropathy Diabetic neuropathy is a nerve disease or nerve damage that is caused by diabetes mellitus. About half of all people with diabetes mellitus have some form of nerve damage. Nerve damage is more common in those who have had diabetes mellitus for many years and who generally have not had good control of their blood sugar (glucose) level. Diabetic neuropathy is a common complication of diabetes mellitus. There are three common types of diabetic neuropathy and a fourth type that is less common and less understood:  Peripheral neuropathy-This is the most common type of diabetic neuropathy. It causes damage to the nerves of the feet and legs first and then eventually the hands and arms. The damage affects the ability to sense touch.  Autonomic neuropathy-This type causes damage to the autonomic nervous system, which controls the following functions:  Heartbeat.  Body temperature.  Blood pressure.  Urination.  Digestion.  Sweating.  Sexual function.  Focal neuropathy-Focal neuropathy can be painful and unpredictable and occurs most often in older adults with diabetes mellitus. It involves a specific nerve or one area and often comes on suddenly. It usually does not cause long-term problems.  Radiculoplexus neuropathy- Sometimes called lumbosacral radiculoplexus neuropathy, radiculoplexus neuropathy affects the nerves of the thighs, hips, buttocks, or legs. It is more common in people with type 2 diabetes mellitus and  in older men. It is characterized by debilitating pain, weakness, and atrophy, usually in the thigh muscles. What are the causes? The cause of  peripheral, autonomic, and focal neuropathies is diabetes mellitus that is uncontrolled and high glucose levels. The cause of radiculoplexus neuropathy is unknown. However, it is thought to be caused by inflammation related to uncontrolled glucose levels. What are the signs or symptoms? Peripheral Neuropathy  Peripheral neuropathy develops slowly over time. When the nerves of the feet and legs no longer work there may be:  Burning, stabbing, or aching pain in the legs or feet.  Inability to feel pressure or pain in your feet. This can lead to:  Thick calluses over pressure areas.  Pressure sores.  Ulcers.  Foot deformities.  Reduced ability to feel temperature changes.  Muscle weakness. Autonomic Neuropathy  The symptoms of autonomic neuropathy vary depending on which nerves are affected. Symptoms may include:  Problems with digestion, such as:  Feeling sick to your stomach (nausea).  Vomiting.  Bloating.  Constipation.  Diarrhea.  Abdominal pain.  Difficulty with urination. This occurs if you lose your ability to sense when your bladder is full. Problems include:  Urine leakage (incontinence).  Inability to empty your bladder completely (retention).  Rapid or irregular heartbeat (palpitations).  Blood pressure drops when you stand up (orthostatic hypotension). When you stand up you may feel:  Dizzy.  Weak.  Faint.  In men, inability to attain and maintain an erection.  In women, vaginal dryness and problems with decreased sexual desire and arousal.  Problems with body temperature regulation.  Increased or decreased sweating. Focal Neuropathy   Abnormal eye movements or abnormal alignment of both eyes.  Weakness in the wrist.  Foot drop. This results in an inability to lift the foot properly and abnormal walking or foot movement.  Paralysis on one side of your face (Bell palsy).  Chest or abdominal pain. Radiculoplexus Neuropathy   Sudden,  severe pain in your hip, thigh, or buttocks.  Weakness and wasting of thigh muscles.  Difficulty rising from a seated position.  Abdominal swelling.  Unexplained weight loss (usually more than 10 lb [4.5 kg]). How is this diagnosed? Peripheral Neuropathy  Your senses may be tested. Sensory function testing can be done with:  A light touch using a monofilament.  A vibration with tuning fork.  A sharp sensation with a pin prick. Other tests that can help diagnose neuropathy are:  Nerve conduction velocity. This test checks the transmission of an electrical current through a nerve.  Electromyography. This shows how muscles respond to electrical signals transmitted by nearby nerves.  Quantitative sensory testing. This is used to assess how your nerves respond to vibrations and changes in temperature. Autonomic Neuropathy  Diagnosis is often based on reported symptoms. Tell your health care provider if you experience:  Dizziness.  Constipation.  Diarrhea.  Inappropriate urination or inability to urinate.  Inability to get or maintain an erection. Tests that may be done include:  Electrocardiography or Holter monitor. These are tests that can help show problems with the heart rate or heart rhythm.  An X-ray exam may be done. Focal Neuropathy  Diagnosis is made based on your symptoms and what your health care provider finds during your exam. Other tests may be done. They may include:  Nerve conduction velocities. This checks the transmission of electrical current through a nerve.  Electromyography. This shows how muscles respond to electrical signals transmitted by nearby nerves.  Quantitative sensory testing. This  test is used to assess how your nerves respond to vibration and changes in temperature. Radiculoplexus Neuropathy   Often the first thing is to eliminate any other issue or problems that might be the cause, as there is no standard test for diagnosis.  X-ray  exam of your spine and lumbar region.  Spinal tap to rule out cancer.  MRI to rule out other lesions. How is this treated? Once nerve damage occurs, it cannot be reversed. The goal of treatment is to keep the disease or nerve damage from getting worse and affecting more nerve fibers. Controlling your blood glucose level is the key. Most people with radiculoplexus neuropathy see at least a partial improvement over time. You will need to keep your blood glucose and HbA1c levels in the target range determined by your health care provider. Things that help control blood glucose levels include:  Blood glucose monitoring.  Meal planning.  Physical activity.  Diabetes medicine. Over time, maintaining lower blood glucose levels helps lessen symptoms. Sometimes, prescription pain medicine is needed. Follow these instructions at home:  Do not smoke.  Keep your blood glucose level in the range that you and your health care provider have determined acceptable for you.  Keep your blood pressure level in the range that you and your health care provider have determined acceptable for you.  Eat a well-balanced diet.  Be physically active every day. Include strength training and balance exercises.  Protect your feet.  Check your feet every day for sores, cuts, blisters, or signs of infection.  Wear padded socks and supportive shoes. Use orthotic inserts, if necessary.  Regularly check the insides of your shoes for worn spots. Make sure there are no rocks or other items inside your shoes before you put them on. Contact a health care provider if:  You have burning, stabbing, or aching pain in the legs or feet.  You are unable to feel pressure or pain in your feet.  You develop problems with digestion such as:  Nausea.  Vomiting.  Bloating.  Constipation.  Diarrhea.  Abdominal pain.  You have difficulty with urination, such as:  Incontinence.  Retention.  You have  palpitations.  You develop orthostatic hypotension. When you stand up you may feel:  Dizzy.  Weak.  Faint.  You cannot attain and maintain an erection (in men).  You have vaginal dryness and problems with decreased sexual desire and arousal (in women).  You have severe pain in your thighs, legs, or buttocks.  You have unexplained weight loss. This information is not intended to replace advice given to you by your health care provider. Make sure you discuss any questions you have with your health care provider. Document Released: 11/28/2001 Document Revised: 02/25/2016 Document Reviewed: 02/28/2013 Elsevier Interactive Patient Education  2017 [employer] -   Diabetes Mellitus and Food It is important for you to manage your blood sugar (glucose) level. Your blood glucose level can be greatly affected by what you eat. Eating healthier foods in the appropriate amounts throughout the day at about the same time each day will help you control your blood glucose level. It can also help slow or prevent worsening of your diabetes mellitus. Healthy eating may even help you improve the level of your blood pressure and reach or maintain a healthy weight. General recommendations for healthful eating and cooking habits include:  Eating meals and snacks regularly. Avoid going long periods of time without eating to lose weight.  Eating a diet that consists  mainly of plant-based foods, such as fruits, vegetables, nuts, legumes, and whole grains.  Using low-heat cooking methods, such as baking, instead of high-heat cooking methods, such as deep frying. Work with your dietitian to make sure you understand how to use the Nutrition Facts information on food labels. How can food affect me? Carbohydrates  Carbohydrates affect your blood glucose level more than any other type of food. Your dietitian will help you determine how many carbohydrates to eat at each meal and teach you how to count  carbohydrates. Counting carbohydrates is important to keep your blood glucose at a healthy level, especially if you are using insulin or taking certain medicines for diabetes mellitus. Alcohol  Alcohol can cause sudden decreases in blood glucose (hypoglycemia), especially if you use insulin or take certain medicines for diabetes mellitus. Hypoglycemia can be a life-threatening condition. Symptoms of hypoglycemia (sleepiness, dizziness, and disorientation) are similar to symptoms of having too much alcohol. If your health care provider has given you approval to drink alcohol, do so in moderation and use the following guidelines:  Women should not have more than one drink per day, and men should not have more than two drinks per day. One drink is equal to:  12 oz of beer.  5 oz of wine.  1 oz of hard liquor.  Do not drink on an empty stomach.  Keep yourself hydrated. Have water, diet soda, or unsweetened iced tea.  Regular soda, juice, and other mixers might contain a lot of carbohydrates and should be counted. What foods are not recommended? As you make food choices, it is important to remember that all foods are not the same. Some foods have fewer nutrients per serving than other foods, even though they might have the same number of calories or carbohydrates. It is difficult to get your body what it needs when you eat foods with fewer nutrients. Examples of foods that you should avoid that are high in calories and carbohydrates but low in nutrients include:  Trans fats (most processed foods list trans fats on the Nutrition Facts label).  Regular soda.  Juice.  Candy.  Sweets, such as cake, pie, doughnuts, and cookies.  Fried foods. What foods can I eat? Eat nutrient-rich foods, which will nourish your body and keep you healthy. The food you should eat also will depend on several factors, including:  The calories you need.  The medicines you take.  Your weight.  Your blood  glucose level.  Your blood pressure level.  Your cholesterol level. You should eat a variety of foods, including:  Protein.  Lean cuts of meat.  Proteins low in saturated fats, such as fish, egg whites, and beans. Avoid processed meats.  Fruits and vegetables.  Fruits and vegetables that may help control blood glucose levels, such as apples, mangoes, and yams.  Dairy products.  Choose fat-free or low-fat dairy products, such as milk, yogurt, and cheese.  Grains, bread, pasta, and rice.  Choose whole grain products, such as multigrain bread, whole oats, and brown rice. These foods may help control blood pressure.  Fats.  Foods containing healthful fats, such as nuts, avocado, olive oil, canola oil, and fish. Does everyone with diabetes mellitus have the same meal plan? Because every person with diabetes mellitus is different, there is not one meal plan that works for everyone. It is very important that you meet with a dietitian who will help you create a meal plan that is just right for you. This information is  not intended to replace advice given to you by your health care provider. Make sure you discuss any questions you have with your health care provider. Document Released: 06/16/2005 Document Revised: 02/25/2016 Document Reviewed: 08/16/2013 Elsevier Interactive Patient Education  2017 [employer].

## 2016-12-19 NOTE — Telephone Encounter (Signed)
PRINTED FOR PASS PROGRAM 

## 2016-12-19 NOTE — Progress Notes (Signed)
Edward [name], is a 46 y.o. male  SAY:[id number]  ATF:[id number]  DOB - 1971/06/20  Chief Complaint  Patient presents with  . Pain        Subjective:   Edward [name] is a 45 y.o. male here today for a follow up visit of htn, dm, chronic pain syndrome. Was in rush and did not take his bp meds this am.  Recently seen 12/06/16 and his bp was much improved on current regimen, 148/90.  Encouraged him to take his bp meds on timely manner.   Pt has oral surgery tomorrow, denies current chest pain. Encouraged to take his meds as prescribed tomorrow, and stay npo.  Of note, still c/o of diffuse neck/right > left shoulder pains, constant pain/ache. Has finished 1 month course of colchicine rx by ortho, Dr Lorin Mercy, and still taking the allopurinol w/o improvement of his pain. He tries to avoid narcotics, last time filled ultram was June  2017 by me, and tylenol #3 in Dec 2017.  He missed his f/u w/ PMR in Feb.   Patient has No headache, No chest pain, No abdominal pain - No Nausea, No new weakness tingling or numbness, No Cough - SOB.  No problems updated.  ALLERGIES: No Known Allergies  PAST MEDICAL HISTORY: Past Medical History:  Diagnosis Date  . Anemia   . Arthritis   . Chronic kidney disease   . Diabetes mellitus   . Graves disease 2014  . Headache   . Hypertension   . Non-compliance   . Shortness of breath dyspnea     MEDICATIONS AT HOME: Prior to Admission medications   Medication Sig Start Date End Date Taking? Authorizing Provider  acetaminophen (TYLENOL) 325 MG tablet Take 650 mg by mouth every 6 (six) hours as needed for mild pain.   Yes Historical Provider, MD  allopurinol (ZYLOPRIM) 100 MG tablet Take 1 tablet (100 mg total) by mouth daily. 12/19/16  Yes Maren Reamer, MD  amLODipine (NORVASC) 10 MG tablet Take 1 tablet (10 mg total) by mouth daily. 12/19/16  Yes Maren Reamer, MD  Blood Glucose Monitoring Suppl (TRUE METRIX METER) W/DEVICE KIT Check  blood sugar before meals and at bedtime 05/29/15  Yes Lance Bosch, NP  glucose blood (TRUE METRIX BLOOD GLUCOSE TEST) test strip Use as instructed 05/29/15  Yes Lance Bosch, NP  glyBURIDE (DIABETA) 5 MG tablet Take 1 tablet (5 mg total) by mouth daily with breakfast. 12/19/16  Yes Maren Reamer, MD  HYDROcodone-acetaminophen (NORCO/VICODIN) 5-325 MG tablet Take 1-2 tablets by mouth every 4 (four) hours as needed. 06/20/16  Yes Recardo Evangelist, PA-C  methimazole (TAPAZOLE) 5 MG tablet Take by mouth 10 mg in am and 5 mg in pm 09/20/16  Yes Philemon Kingdom, MD  metoprolol succinate (TOPROL XL) 50 MG 24 hr tablet Take 1 tablet (50 mg total) by mouth daily. Take with or immediately following a meal. 12/19/16  Yes Maren Reamer, MD  NONFORMULARY OR COMPOUNDED ITEM Apply 1-2 g topically 4 (four) times daily. Peripheral Neuropathy cream: Bupivacaine 1% Doxepin 3% Gabapentin 6% Ibuprofen 3% Pentoxifylline 3% 10/17/16  Yes Edrick Kins, DPM  ofloxacin (OCUFLOX) 0.3 % ophthalmic solution Place 1 drop into the right eye 4 (four) times daily. 03/22/16  Yes Historical Provider, MD  pravastatin (PRAVACHOL) 20 MG tablet Take 1 tablet (20 mg total) by mouth daily. 12/19/16  Yes Maren Reamer, MD  sildenafil (VIAGRA) 100 MG tablet Take 1 tablet (100 mg  total) by mouth daily as needed for erectile dysfunction. If no effect, may double dose next day/time 12/19/16  Yes Olugbemiga E Doreene Burke, MD  traMADol (ULTRAM) 50 MG tablet Take 1 tablet (50 mg total) by mouth every 8 (eight) hours as needed for severe pain. 12/19/16  Yes Maren Reamer, MD  TRUEPLUS LANCETS 28G MISC Check blood sugar before meals and at bedtime 05/29/15  Yes Lance Bosch, NP  VOLTAREN 1 % GEL Apply 2 g topically 4 (four) times daily. 10/04/16  Yes Maren Reamer, MD  gabapentin (NEURONTIN) 100 MG capsule Take 1 capsule (100 mg total) by mouth 3 (three) times daily. 12/19/16   Maren Reamer, MD  methocarbamol (ROBAXIN) 500 MG tablet Take 1  tablet (500 mg total) by mouth every 8 (eight) hours as needed for muscle spasms. 12/19/16   Maren Reamer, MD     Objective:   Vitals:   12/19/16 1656  BP: (!) 161/89  Pulse: 75  Resp: 18  Temp: 98.4 F (36.9 C)  TempSrc: Oral  SpO2: 100%  Weight: 224 lb (101.6 kg)  Height: 6' (1.829 m)    Exam General appearance : Awake, alert, not in any distress. Speech Clear. Not toxic looking, pleasant. HEENT: Atraumatic and Normocephalic Neck: supple, no JVD.  Chest:Good air entry bilaterally, no added sounds. CVS: S1 S2 regular, no murmurs/gallups or rubs. Abdomen: Bowel sounds active, Non tender and not distended with no gaurding, rigidity or rebound. Extremities: B/L Lower Ext shows no edema, both legs are warm to touch Neurology: Awake alert, and oriented X 3, CN II-XII grossly intact, Non focal Skin:No Rash  Data Review Lab Results  Component Value Date   HGBA1C 7.3 12/19/2016   HGBA1C 7.2 09/19/2016   HGBA1C 5.2 03/21/2016    Depression screen PHQ 2/9 12/19/2016 11/07/2016 09/19/2016 03/21/2016 12/08/2015  Decreased Interest _0 0  Down, Depressed, Hopeless 0 _1 0  PHQ - 2 Score _2 0  Altered sleeping _3 0 -  Tired, decreased energy _4 0 -  Change in appetite _5 0 -  Feeling bad or failure about yourself  0 0 1 1 -  Trouble concentrating 1 2 (No Data) 3 -  Moving slowly or fidgety/restless 0 0 3 0 -  Suicidal thoughts 0 0 0 0 -  PHQ-9 Score _6 -      Assessment & Plan   1. Uncontrolled type 2 diabetes mellitus with complication, without long-term current use of insulin ([place]), w/ ckd 3 and neuropathy - continue glyburide 19m qd - POCT glucose (manual entry) - POCT glycosylated hemoglobin (Hb A1C) 7.3 (from 7.2 -126/83/41 - BASIC METABOLIC PANEL WITH GFR  2. Uncontrolled hypertension - renewed toprol xl 50 qd - amLODipine (NORVASC) 10 MG tablet; Take 1 tablet (10 mg total) by mouth daily.  Dispense: 30 tablet; Refill: 3  3. Gout,  unspecified cause, unspecified chronicity, unspecified site - his body aches are unchanged since starting allopurinol 100 qd and sp 190monthx of colchicine per ortho. - Uric Acid  4. 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  5. ckd 3 chk bmp today, tight bp and dm control recd, encouraged to take his meds on timely manner.  6. Dental infection Has OMF surg  tomorrow, [place].   Patient have been counseled extensively about nutrition and exercise  Return in about 3 months (around 03/21/2017), or if symptoms worsen or fail to improve.  The patient was given clear instructions to go to ER or return to medical center if symptoms don't improve, worsen or new problems develop. The patient verbalized understanding. The patient was told to call to get lab results if they haven't heard anything in the next week.   This note has been created with Surveyor, quantity. Any transcriptional errors are unintentional.   Maren Reamer, MD, [place] [facility] [place], [place]   12/19/2016, 5:22 PM

## 2016-12-20 LAB — URIC ACID: Uric Acid, Serum: 7.3 mg/dL (ref 4.0–8.0)

## 2017-01-04 ENCOUNTER — Ambulatory Visit: Payer: Medicaid Other | Admitting: Internal Medicine

## 2017-01-10 ENCOUNTER — Ambulatory Visit (INDEPENDENT_AMBULATORY_CARE_PROVIDER_SITE_OTHER): Payer: Medicaid Other | Admitting: Internal Medicine

## 2017-01-10 ENCOUNTER — Encounter: Payer: Self-pay | Admitting: Internal Medicine

## 2017-01-10 VITALS — BP 134/84 | HR 73 | Wt 219.0 lb

## 2017-01-10 DIAGNOSIS — E0500 Thyrotoxicosis with diffuse goiter without thyrotoxic crisis or storm: Secondary | ICD-10-CM | POA: Diagnosis not present

## 2017-01-10 NOTE — Progress Notes (Signed)
Patient ID: Edward [name], male   DOB: 06/22/1971, 46 y.o.   MRN: [id number]   HPI  Edward [name] is a 46 y.o.-year-old male, initially referred by his PCP, Dr. Keck, returning for f/u for Graves ds. Last visit 4 mo ago.  He continues to have mm pain that has been going on for ~2 years >> shoulders and upper back >> down arms  >> hands. He saw neurology and orthopedics. Reportedly: dx of gout but also shoulder and knee spurs, DDD. He has an appt with ortho again soon. He was going to the pain clinic before.  I reviewed pt's thyroid tests: Lab Results  Component Value Date   TSH 0.04 (L) 09/20/2016   TSH 0.03 (L) 05/31/2016   TSH 0.05 (L) 04/11/2016   TSH 0.07 (L) 02/26/2016   TSH 0.01 (L) 01/08/2016   TSH 0.018 (L) 11/25/2015   TSH <0.008 (L) 05/29/2015   TSH <0.008 (L) 06/21/2013   FREET4 1.50 09/20/2016   FREET4 1.24 05/31/2016   FREET4 1.14 04/11/2016   FREET4 1.97 (H) 02/26/2016   FREET4 2.1 (H) 01/08/2016   FREET4 1.27 05/29/2015    Component     Latest Ref Rng 02/26/2016  TSI     <140 % baseline 463 (H)  Elevated Graves antibodies + overt thyrotoxicosis >> dx of Graves' disease.   We started methimazole at 5 mg twice a day >> increased to 3x a day, but he was noncompliant with doses and visits. At last visit, I advised him to restart MMI 10 mg in am and 5 mg in pm. He did not come back for labs as advised. He was also skipping doses and tells me that he actually did not take the med in few weeks.  Pt denies feeling nodules in neck, hoarseness, dysphagia/odynophagia, + occasional SOB with lying down; he mentions: - + weight loss - + fatigue - + excessive sweating/heat intolerance but also cold intolerance - no tremors - no anxiety - + insomnia - no palpitations - no hyperdefecation  Pt does not have a FH of thyroid ds. No FH of thyroid cancer. No h/o radiation tx to head or neck.  No seaweed or kelp, no recent contrast studies. No steroid use. No herbal  supplements. No Biotin use.   I reviewed his chart and he also has a history of DM2 - reportedly last HbA1c 7.3%, CKD 2/2 DM2, back pain, OA.   He just had several surgeries - OU DR. 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   ROS: Constitutional: + see HPI, + excessive urination, + nocturia Eyes: + blurry vision, no xerophthalmia ENT: no sore throat, no nodules palpated in throat, no dysphagia/odynophagia, + hoarseness Cardiovascular: no CP/palpitations/no leg swelling Respiratory: + cough/+ SOB Gastrointestinal: + N/no V/D/+ C Musculoskeletal: + both: muscle/joint aches Skin: no rashes, no hair loss Neurological: no tremors/numbness/tingling/dizziness, + HA  I reviewed pt's medications, allergies, PMH, social hx, family hx, and changes were documented in the history of present illness. Otherwise, unchanged from my initial visit note.  Past Medical History:  Diagnosis Date  . Anemia   . Arthritis   . Chronic kidney disease   . Diabetes mellitus   . Graves disease 2014  . Headache   . Hypertension   . Non-compliance   . Shortness of breath dyspnea    Past Surgical History:  Procedure Laterality Date  . CIRCUMCISION     as a child, around 5 years of age  .   EYE SURGERY    . PARS PLANA VITRECTOMY Right 03/25/2016   Procedure: PARS PLANA VITRECTOMY 25 GAUGE FOR ENDOPHTHALMITIS;  Surgeon: Narendra Patel, MD;  Location: [facility];  Service: Ophthalmology;  Laterality: Right;  . WISDOM TOOTH EXTRACTION     Social History   Social History  . Marital Status: Married    Spouse Name: N/A  . Number of Children: 5: 25-4 y/o (2017)  . Years of Education: N/A   Occupational History  . Chef-cook   Social History Main Topics  . Smoking status: Former Smoker -- 0.75 packs/day for 3 years    Types: Cigarettes    Quit date: 2004  . Smokeless tobacco: Not on file  . Alcohol Use:      Comment: 1 beer every night  . Drug Use: No   Current Outpatient Prescriptions on File Prior to  Visit  Medication Sig Dispense Refill  . acetaminophen (TYLENOL) 325 MG tablet Take 650 mg by mouth every 6 (six) hours as needed for mild pain.    . allopurinol (ZYLOPRIM) 100 MG tablet Take 1 tablet (100 mg total) by mouth daily. 30 tablet 2  . amLODipine (NORVASC) 10 MG tablet Take 1 tablet (10 mg total) by mouth daily. 30 tablet 3  . Blood Glucose Monitoring Suppl (TRUE METRIX METER) W/DEVICE KIT Check blood sugar before meals and at bedtime 1 kit 0  . gabapentin (NEURONTIN) 100 MG capsule Take 1 capsule (100 mg total) by mouth 3 (three) times daily. 90 capsule 3  . glucose blood (TRUE METRIX BLOOD GLUCOSE TEST) test strip Use as instructed 100 each 12  . glyBURIDE (DIABETA) 5 MG tablet Take 1 tablet (5 mg total) by mouth daily with breakfast. 30 tablet 3  . HYDROcodone-acetaminophen (NORCO/VICODIN) 5-325 MG tablet Take 1-2 tablets by mouth every 4 (four) hours as needed. 10 tablet 0  . methimazole (TAPAZOLE) 5 MG tablet Take by mouth 10 mg in am and 5 mg in pm 90 tablet 1  . methocarbamol (ROBAXIN) 500 MG tablet Take 1 tablet (500 mg total) by mouth every 8 (eight) hours as needed for muscle spasms. 60 tablet 2  . metoprolol succinate (TOPROL XL) 50 MG 24 hr tablet Take 1 tablet (50 mg total) by mouth daily. Take with or immediately following a meal. 60 tablet 3  . NONFORMULARY OR COMPOUNDED ITEM Apply 1-2 g topically 4 (four) times daily. Peripheral Neuropathy cream: Bupivacaine 1% Doxepin 3% Gabapentin 6% Ibuprofen 3% Pentoxifylline 3% 120 each 2  . ofloxacin (OCUFLOX) 0.3 % ophthalmic solution Place 1 drop into the right eye 4 (four) times daily.  0  . pravastatin (PRAVACHOL) 20 MG tablet Take 1 tablet (20 mg total) by mouth daily. 90 tablet 3  . sildenafil (VIAGRA) 100 MG tablet Take 1 tablet (100 mg total) by mouth daily as needed for erectile dysfunction. If no effect, may double dose next day/time 30 tablet 3  . traMADol (ULTRAM) 50 MG tablet Take 1 tablet (50 mg total) by mouth every 8  (eight) hours as needed for severe pain. 45 tablet 0  . TRUEPLUS LANCETS 28G MISC Check blood sugar before meals and at bedtime 100 each 11  . VOLTAREN 1 % GEL Apply 2 g topically 4 (four) times daily. 100 g 2   No current facility-administered medications on file prior to visit.    No Known Allergies Family History  Problem Relation Age of Onset  . Hypertension Mother   . Diabetes Mother    PE:   BP 134/84 (BP Location: Left Arm, Patient Position: Sitting)   Pulse 73   Wt 219 lb (99.3 kg)   SpO2 98%   BMI 29.70 kg/m   She did not take his BP meds today. Wt Readings from Last 3 Encounters:  01/10/17 219 lb (99.3 kg)  12/19/16 224 lb (101.6 kg)  11/07/16 233 lb 3.2 oz (105.8 kg)   Constitutional: overweight, in NAD Eyes: PERRLA, EOMI, + exophthalmos, no lid lag, no stare ENT: moist mucous membranes, no thyromegaly - But left lobe slightly more prominent, no thyroid bruits, no cervical lymphadenopathy Cardiovascular: RRR, No MRG Respiratory: CTA B Gastrointestinal: abdomen soft, NT, ND, BS+ Musculoskeletal: no deformities, strength intact in all 4 Skin: moist, warm, no rashes Neurological: no tremor with outstretched hands, DTR normal in all 4  ASSESSMENT: 1. 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 Patient with a low TSH dating since at least 2014, with thyrotoxic sxs: weight loss, heat intolerance,  fatigue, generalized pain, insomnia. We dx'ed Graves ds based on thyrotoxicosis timeline and elevated TSIs. We started MMI but he is not compliant with this. He also refuses RAI tx or thyroidectomy. We discussed about risks of untreated Graves ds. He decided to retry MMI.  - Will restart MMI at 10 mg in am and 5 mg in pm   - will check TSH, fT3 and fT4 in 5-6 weeks - I did advise him that I cannot see him anymore if he is not compliant with any of the above options... - RTC in 4 months   , MD PhD [facility]  

## 2017-01-10 NOTE — Patient Instructions (Signed)
Please restart Methimazole 10 mg in am and 5 mg in pm. DO NOT SKIP DOSES!  Come back for labs in 6 weeks.  Please come back for a follow-up appointment in 4 months.

## 2017-01-17 ENCOUNTER — Encounter (INDEPENDENT_AMBULATORY_CARE_PROVIDER_SITE_OTHER): Payer: Self-pay | Admitting: Orthopaedic Surgery

## 2017-01-17 ENCOUNTER — Ambulatory Visit (INDEPENDENT_AMBULATORY_CARE_PROVIDER_SITE_OTHER): Payer: Medicaid Other | Admitting: Orthopaedic Surgery

## 2017-01-17 VITALS — BP 171/94 | HR 70 | Ht 72.0 in | Wt 219.0 lb

## 2017-01-17 DIAGNOSIS — M25512 Pain in left shoulder: Secondary | ICD-10-CM | POA: Diagnosis not present

## 2017-01-17 DIAGNOSIS — G8929 Other chronic pain: Secondary | ICD-10-CM

## 2017-01-17 MED ORDER — METHYLPREDNISOLONE ACETATE 40 MG/ML IJ SUSP
30.0000 mg | INTRAMUSCULAR | Status: AC | PRN
  Administered 2017-01-17: 30 mg via INTRA_ARTICULAR

## 2017-01-17 MED ORDER — LIDOCAINE HCL 1 % IJ SOLN
1.0000 mL | INTRAMUSCULAR | Status: AC | PRN
  Administered 2017-01-17: 1 mL

## 2017-01-17 MED ORDER — BUPIVACAINE HCL 0.25 % IJ SOLN
4.0000 mL | INTRAMUSCULAR | Status: AC | PRN
  Administered 2017-01-17: 4 mL via INTRA_ARTICULAR

## 2017-01-17 NOTE — Progress Notes (Signed)
Office Visit Note   Patient: Edward [name]           Date of Birth: 05-Sep-1971           MRN: [id number] Visit Date: 01/17/2017              Requested by: Maren Reamer, MD [address] [place], [place] [address] PCP: Maren Reamer, MD   Assessment & Plan: Visit Diagnoses:  1. Chronic left shoulder pain     Plan: Subacromial injection performed with 1/2 mL of Depo-Medrol and some Marcaine for cc. He got some improvement with his range of motion. We'll check him back again in 4 weeks.  Follow-Up Instructions: No Follow-up on file.   Orders:  Orders Placed This Encounter  Procedures  . Large Joint Injection/Arthrocentesis   No orders of the defined types were placed in this encounter.     Procedures: Large Joint Inj Date/Time: 01/17/2017 10:12 AM Performed by: Marybelle Killings Authorized by: Marybelle Killings   Consent Given by:  Patient Indications:  Pain Location:  Shoulder Site:  R subacromial bursa Needle Size:  22 G Needle Length:  1.5 inches Ultrasound Guidance: No   Fluoroscopic Guidance: No   Arthrogram: No   Medications:  1 mL lidocaine 1 %; 4 mL bupivacaine 0.25 %; 30 mg methylPREDNISolone acetate 40 MG/ML Aspiration Attempted: No   Patient tolerance:  Patient tolerated the procedure well with no immediate complications     Clinical Data: No additional findings.   Subjective: Chief Complaint  Patient presents with  . Right Shoulder - Pain  . Left Shoulder - Pain    HPI patient turns states still having considerable problems with his right shoulder. He can't lift his hand above shoulder level. Pain with the bathing. Opposite left shoulder gives him some of the similar symptoms but is not as severe. He states has been present like this for months. Patient denies chills or fever plain radiograph showed no evidence of avascular necrosis had mild before meals joint degenerative changes. 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 It was a 8.2 in January and in February it was 7.3. No associated neck pain no chills or fever.  Review of Systems 14 point review of systems is updated and is unchanged from last office visit other than as above.   Objective: Vital Signs: BP (!) 171/94   Pulse 70   Ht 6' (1.829 m)   Wt 219 lb (99.3 kg)   BMI 29.70 kg/m   Physical Exam  Constitutional: He is oriented to person, place, and time. He appears well-developed and well-nourished.  HENT:  Head: Normocephalic and atraumatic.  Eyes: EOM are normal. Pupils are equal, round, and reactive to light.  Neck: No tracheal deviation present. No thyromegaly present.  Cardiovascular: Normal rate.   Pulmonary/Chest: Effort normal. He has no wheezes.  Abdominal: Soft. Bowel sounds are normal.  Musculoskeletal:  Patient has positive impingement right shoulder he complains of pain with resisted supraspinatus testing. She complains of pain with attempts to internally rotate the shoulder past the midaxillary line but does not have any resistance with passive motion that would suggest adhesive capsulitis. With the attempts at internal rotation with arm abducted 70 patient that I gives excellent resistance try and do prevent a shoulder from moving. No evidence of adhesive capsulitis on passive range of motion but he does have pain and resists attempts at  motion. Long head of biceps is normal. No shoulder subluxation.  Neurological: He is alert and oriented to person, place, and time.  Skin: Skin is warm and dry. Capillary refill takes less than 2 seconds.  Psychiatric: He has a normal mood and affect. His behavior is normal. Judgment and thought content normal.    Ortho Exam  Specialty Comments:  No specialty comments available.  Imaging: No results found.   PMFS History: Patient Active Problem List   Diagnosis Date Noted  . Graves disease 03/02/2016  . Non compliance with medical treatment  11/26/2015  . Uncontrolled hypertension 11/26/2015  . Acute kidney injury superimposed on chronic kidney disease ([place]) 11/25/2015  . Chest pain at rest 11/25/2015  . Hyperkalemia 11/25/2015  . Diabetes mellitus type 2, uncontrolled, with complications ([place]) 20/94/7096   Past Medical History:  Diagnosis Date  . Anemia   . Arthritis   . Chronic kidney disease   . Diabetes mellitus   . Graves disease 2014  . Headache   . Hypertension   . Non-compliance   . Shortness of breath dyspnea     Family History  Problem Relation Age of Onset  . Hypertension Mother   . Diabetes Mother     Past Surgical History:  Procedure Laterality Date  . CIRCUMCISION     as a child, around 6 years of age  . EYE SURGERY    . PARS PLANA VITRECTOMY Right 03/25/2016   Procedure: PARS PLANA VITRECTOMY 25 GAUGE FOR ENDOPHTHALMITIS;  Surgeon: Jalene Mullet, MD;  Location: [facility];  Service: Ophthalmology;  Laterality: Right;  . WISDOM TOOTH EXTRACTION     Social History   Occupational History  . Not on file.   Social History Main Topics  . Smoking status: Former Smoker    Packs/day: 0.75    Years: 3.00    Types: Cigarettes    Quit date: 05/28/2000  . Smokeless tobacco: Never Used  . Alcohol use 0.0 oz/week     Comment: 1 beer every night  . Drug use: No  . Sexual activity: Not on file

## 2017-02-10 MED FILL — glyBURIDE 5 MG TABS: 5 | 30 days supply | Qty: 30 | Fill #0

## 2017-02-10 MED FILL — $VIAGRA 100 MG TABLET: 100 | 30 days supply | Qty: 10 | Fill #0

## 2017-02-10 MED FILL — ?METOPROLOL SUCC ER 50 MG: 50 MG | 30 days supply | Qty: 30 | Fill #0

## 2017-02-10 MED FILL — AMLODIPINE BESYLATE 10 MG T: 10 | 30 days supply | Qty: 30 | Fill #0

## 2017-02-10 MED FILL — PRAVASTATIN NA 20 MG TAB: 20 | 30 days supply | Qty: 30 | Fill #0

## 2017-02-16 ENCOUNTER — Encounter: Payer: Self-pay | Admitting: Internal Medicine

## 2017-02-21 ENCOUNTER — Ambulatory Visit (INDEPENDENT_AMBULATORY_CARE_PROVIDER_SITE_OTHER): Payer: Medicaid Other | Admitting: Orthopaedic Surgery

## 2017-02-21 ENCOUNTER — Encounter (INDEPENDENT_AMBULATORY_CARE_PROVIDER_SITE_OTHER): Payer: Self-pay | Admitting: Orthopaedic Surgery

## 2017-02-21 ENCOUNTER — Other Ambulatory Visit (INDEPENDENT_AMBULATORY_CARE_PROVIDER_SITE_OTHER): Payer: Medicaid Other

## 2017-02-21 VITALS — BP 152/94 | HR 73 | Temp 97.9°F | Ht 72.0 in | Wt 219.0 lb

## 2017-02-21 DIAGNOSIS — M1A00X0 Idiopathic chronic gout, unspecified site, without tophus (tophi): Secondary | ICD-10-CM | POA: Diagnosis not present

## 2017-02-21 DIAGNOSIS — E0500 Thyrotoxicosis with diffuse goiter without thyrotoxic crisis or storm: Secondary | ICD-10-CM

## 2017-02-21 LAB — T4, FREE: Free T4: 1.48 ng/dL (ref 0.60–1.60)

## 2017-02-21 LAB — T3, FREE: T3, Free: 4 pg/mL (ref 2.3–4.2)

## 2017-02-21 LAB — TSH: TSH: <0.01 u[IU]/mL — ABNORMAL LOW (ref 0.35–4.50)

## 2017-02-21 MED ORDER — METHIMAZOLE 5 MG PO TABS: ORAL_TABLET | ORAL | 1 refills | Status: DC

## 2017-02-21 NOTE — Progress Notes (Signed)
Office Visit Note   Patient: Edward [name]           Date of Birth: November 16, 1970           MRN: [id number] Visit Date: 02/21/2017              Requested by: Maren Reamer, MD [address] [place], [place] [address] PCP: Maren Reamer, MD   Assessment & Plan: Visit Diagnoses: Multiple joint complaints with history of hyperuricemia. He's been taking allopurinol 100 mg daily. He has problems with diabetes and is not a candidate for  Prednisone. He took Colcrys in the past and treated for a whole month and stated it really did help. He is not not been on Uloric in the past. His renal disease prevents him from taking anti-inflammatories.  Plan: I discussed with patient that he has  multiple medical problems which complicates treatment for his hyperuricemia. The top was medical doctor about the allopurinol that he is taking. The colchicine was not effective. He'll follow-up with her for further discussion on appropriate treatment for his hyperuricemia. Unfortunately the shoulder injection did not help the symptoms.  Follow-Up Instructions: Return if symptoms worsen or fail to improve.   Orders:  No orders of the defined types were placed in this encounter.  No orders of the defined types were placed in this encounter.     Procedures: No procedures performed   Clinical Data: No additional findings.   Subjective: Chief Complaint  Patient presents with  . Right Shoulder - Follow-up    HPI patient turned she states a shoulder injection helped for few hours or day or so but then he had recurrent pain problems. He has hyperuricemia has diabetes also has hypertension. He did not have problems with hyperglycemia after his cortisone injection in his shoulder he states is really not any better. His shoulder is bothering him is any increased pain left knee pain in his feet also back pain.  Review of Systems 14 point review of systems updated positive for diabetes with A1c  greater than 7, chronic kidney disease with creatinine greater than 2. He has hypertension and history of the radius disease. 14 point review of systems otherwise negative as it pertains his history of present illness.   Objective: Vital Signs: BP (!) 152/94 (BP Location: Left Arm, Patient Position: Sitting)   Pulse 73   Temp 97.9 F (36.6 C) (Oral)   Ht 6' (1.829 m)   Wt 219 lb (99.3 kg)   BMI 29.70 kg/m   Physical Exam  Constitutional: He is oriented to person, place, and time. He appears well-developed and well-nourished.  HENT:  Head: Normocephalic and atraumatic.  Eyes: EOM are normal. Pupils are equal, round, and reactive to light.  Neck: No tracheal deviation present. No thyromegaly present.  Cardiovascular: Normal rate.   Pulmonary/Chest: Effort normal. He has no wheezes.  Abdominal: Soft. Bowel sounds are normal.  Musculoskeletal:  Patient then tourniquet low short stride deliberate gait is limping on his left knee. There is a trace knee effusion on the left. Tenderness palpation no increased warmth. Has some tenderness over his ankles are trace lower extremity edema. There is some scarring over the pretibial area good hip range of motion. Positive impingement right shoulder. It was reach full extension no synovitis of his wrist or from fingers.  Neurological: He is alert and oriented to person, place, and time.  Skin: Skin is warm and dry. Capillary refill takes less than 2  seconds.  Psychiatric: He has a normal mood and affect. His behavior is normal. Judgment and thought content normal.    Ortho Exam  Specialty Comments:  No specialty comments available.  Imaging: No results found.   PMFS History: Patient Active Problem List   Diagnosis Date Noted  . Graves disease 03/02/2016  . Non compliance with medical treatment 11/26/2015  . Uncontrolled hypertension 11/26/2015  . Acute kidney injury superimposed on chronic kidney disease ([place]) 11/25/2015  . Chest pain at  rest 11/25/2015  . Hyperkalemia 11/25/2015  . Diabetes mellitus type 2, uncontrolled, with complications ([place]) 88/41/6606   Past Medical History:  Diagnosis Date  . Anemia   . Arthritis   . Chronic kidney disease   . Diabetes mellitus   . Graves disease 2014  . Headache   . Hypertension   . Non-compliance   . Shortness of breath dyspnea     Family History  Problem Relation Age of Onset  . Hypertension Mother   . Diabetes Mother     Past Surgical History:  Procedure Laterality Date  . CIRCUMCISION     as a child, around 32 years of age  . EYE SURGERY    . PARS PLANA VITRECTOMY Right 03/25/2016   Procedure: PARS PLANA VITRECTOMY 25 GAUGE FOR ENDOPHTHALMITIS;  Surgeon: Jalene Mullet, MD;  Location: [facility];  Service: Ophthalmology;  Laterality: Right;  . WISDOM TOOTH EXTRACTION     Social History   Occupational History  . Not on file.   Social History Main Topics  . Smoking status: Former Smoker    Packs/day: 0.75    Years: 3.00    Types: Cigarettes    Quit date: 05/28/2000  . Smokeless tobacco: Never Used  . Alcohol use 0.0 oz/week     Comment: 1 beer every night  . Drug use: No  . Sexual activity: Not on file

## 2017-03-28 ENCOUNTER — Ambulatory Visit: Payer: Medicaid Other | Admitting: Family Medicine

## 2017-03-28 MED FILL — methIMAzole 5 MG TABS: 5 | 30 days supply | Qty: 90 | Fill #0

## 2017-03-28 MED FILL — $VIAGRA 100 MG TABLET: 100 | 30 days supply | Qty: 10 | Fill #1

## 2017-05-12 ENCOUNTER — Ambulatory Visit (INDEPENDENT_AMBULATORY_CARE_PROVIDER_SITE_OTHER): Payer: Medicaid Other | Admitting: Internal Medicine

## 2017-05-12 ENCOUNTER — Encounter: Payer: Self-pay | Admitting: Internal Medicine

## 2017-05-12 VITALS — BP 130/86 | HR 71 | Wt 215.0 lb

## 2017-05-12 DIAGNOSIS — E0500 Thyrotoxicosis with diffuse goiter without thyrotoxic crisis or storm: Secondary | ICD-10-CM | POA: Diagnosis not present

## 2017-05-12 LAB — TSH: TSH: <0.01 u[IU]/mL — ABNORMAL LOW (ref 0.35–4.50)

## 2017-05-12 LAB — T3, FREE: T3 FREE: 4.2 pg/mL (ref 2.3–4.2)

## 2017-05-12 LAB — T4, FREE: FREE T4: 1.69 ng/dL — AB (ref 0.60–1.60)

## 2017-05-12 MED ORDER — METHIMAZOLE 5 MG PO TABS: ORAL_TABLET | ORAL | 1 refills | Status: DC

## 2017-05-12 NOTE — Patient Instructions (Addendum)
Please continue 10 mg methimazole daily.  Please stop at the lab.  If we go ahead with the Thyroid Uptake and scan, then stop Methimazole 4 days before the test.  If we go ahead with the RAI treatment, then stop Methimazole 4 days before the treatment.  Please come back for a follow-up appointment in 3 months.   Radioiodine (I-131) Therapy for Hyperthyroidism Radioiodine (I-131) therapy is a procedure to treat an overactive thyroid gland (hyperthyroidism). The thyroid is a gland in the neck that uses iodine to help control how the body uses food (metabolism). In this procedure, you swallow a pill or liquid that contains I-131. I-131 is manufactured (synthetic) iodine that gives off radiation. This destroys thyroid cells and reverses hyperthyroidism. Tell a health care provider about:  Any allergies you have.  All medicines you are taking, including vitamins, herbs, eye drops, creams, and over-the-counter medicines.  Any problems you or family members have had with anesthetic medicines.  Any blood disorders you have.  Any surgeries you have had.  Any medical conditions you have.  Whether you are pregnant, may be pregnant, or have gone through menopause, if this applies.  Whether you currently have children.  Whether you plan to have children in the next 2 years.  Any contact you have with children or pregnant women.  Your travel plans for the next 3 months.  Whether you pass through radiation detectors for work or travel. What are the risks? Generally, this is a safe procedure. However, problems may occur, including:  Damage to other structures or organs, such as the salivary glands. This could lead to dry mouth and loss of taste.  Low sperm count, if this applies. This may lead to temporary infertility.  Sore throat or neck pain. This is temporary.  Slightlyincreased risk of thyroid cancer.  Nausea or vomiting.  What happens before the procedure?  Ask your health  care provider about changing or stopping your regular medicines. This is especially important if you are taking diabetes medicines, blood thinners, or thyroid medicines.  Women may be asked to take a pregnancy test.  Women who are breastfeeding should plan to stop at least 6 weeks before the procedure.  Follow instructions from your health care provider about eating or drinking restrictions.  Plan to avoid contact with others for 1 week after your treatment. It is most important to avoid contact with children and pregnant women. To do this, plan to stay home from work, arrange child care, and sleep alone, if these things apply to you.  Plan to drive yourself home after treatment. Do not take public transportation. If you need someone to drive you home, sit as far away from the driver as possible. What happens during the procedure?  You will be given a dose of I-131 to swallow. It may be a pill or a liquid.  Your thyroid gland will absorb the I-131 over the next 3 months. The treatment process will be complete in about 6 months. What happens after the procedure?  You may need to stay in the hospital for 24 hours after your treatment. This depends on the requirements in your state.  Follow instructions from your health care provider about: ? How to take care of yourself after the procedure. ? How to protect others from exposure to radiation as it leaves your body. This information is not intended to replace advice given to you by your health care provider. Make sure you discuss any questions you have with your  health care provider. Document Released: 02/05/2009 Document Revised: 02/23/2016 Document Reviewed: 01/14/2015 Elsevier Interactive Patient Education  2018 [employer].   Radioiodine (I-131) Therapy for Hyperthyroidism Radioiodine (I-131) therapy is a procedure to treat an overactive thyroid gland (hyperthyroidism). The thyroid is a gland in the neck that uses iodine to help  control how the body uses food (metabolism). In this procedure, you swallow a pill or liquid that contains I-131. I-131 is manufactured (synthetic) iodine that gives off radiation. This destroys thyroid cells and reverses hyperthyroidism. Tell a health care provider about:  Any allergies you have.  All medicines you are taking, including vitamins, herbs, eye drops, creams, and over-the-counter medicines.  Any problems you or family members have had with anesthetic medicines.  Any blood disorders you have.  Any surgeries you have had.  Any medical conditions you have.  Whether you are pregnant, may be pregnant, or have gone through menopause, if this applies.  Whether you currently have children.  Whether you plan to have children in the next 2 years.  Any contact you have with children or pregnant women.  Your travel plans for the next 3 months.  Whether you pass through radiation detectors for work or travel. What are the risks? Generally, this is a safe procedure. However, problems may occur, including:  Damage to other structures or organs, such as the salivary glands. This could lead to dry mouth and loss of taste.  Low sperm count, if this applies. This may lead to temporary infertility.  Sore throat or neck pain. This is temporary.  Slightlyincreased risk of thyroid cancer.  Nausea or vomiting.  What happens before the procedure?  Ask your health care provider about changing or stopping your regular medicines. This is especially important if you are taking diabetes medicines, blood thinners, or thyroid medicines.  Women may be asked to take a pregnancy test.  Women who are breastfeeding should plan to stop at least 6 weeks before the procedure.  Follow instructions from your health care provider about eating or drinking restrictions.  Plan to avoid contact with others for 1 week after your treatment. It is most important to avoid contact with children and  pregnant women. To do this, plan to stay home from work, arrange child care, and sleep alone, if these things apply to you.  Plan to drive yourself home after treatment. Do not take public transportation. If you need someone to drive you home, sit as far away from the driver as possible. What happens during the procedure?  You will be given a dose of I-131 to swallow. It may be a pill or a liquid.  Your thyroid gland will absorb the I-131 over the next 3 months. The treatment process will be complete in about 6 months. What happens after the procedure?  You may need to stay in the hospital for 24 hours after your treatment. This depends on the requirements in your state.  Follow instructions from your health care provider about: ? How to take care of yourself after the procedure. ? How to protect others from exposure to radiation as it leaves your body. This information is not intended to replace advice given to you by your health care provider. Make sure you discuss any questions you have with your health care provider. Document Released: 02/05/2009 Document Revised: 02/23/2016 Document Reviewed: 01/14/2015 Elsevier Interactive Patient Education  [employer].

## 2017-05-12 NOTE — Progress Notes (Addendum)
Patient ID: Edward [name], male   DOB: 07-03-1971, 46 y.o.   MRN: [id number]   HPI  Edward [name] is a 46 y.o.-year-old male, initially referred by his PCP, Dr. Feliciana Rossetti, returning for f/u for Graves ds. Last visit 4 mo ago.  Patient has a history of muscle pain, that has been going on for more than 2 years (shoulders and upper back radiating down his arms to his hands). He saw neurology and orthopedics. He was also seen in the pain clinic before. He continued to complain about this pain today. 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 Stopped Neurontin as it did not help.  We started methimazole but he was noncompliant over the years with the dosing and at last visit, he told me that he did not take the medication in few weeks. Subsequently, TFTs have remained abnormal.   We increased his MMI dose to 10 mg bid in 01/2017. However, he tells me that he seldom takes the second dose, and he also misses the morning dose frequently.  I reviewed pt's thyroid tests: Lab Results  Component Value Date   TSH <0.01 (L) 02/21/2017   TSH 0.04 (L) 09/20/2016   TSH 0.03 (L) 05/31/2016   TSH 0.05 (L) 04/11/2016   TSH 0.07 (L) 02/26/2016   TSH 0.01 (L) 01/08/2016   TSH 0.018 (L) 11/25/2015   TSH <0.008 (L) 05/29/2015   TSH <0.008 (L) 06/21/2013   FREET4 1.48 02/21/2017   FREET4 1.50 09/20/2016   FREET4 1.24 05/31/2016   FREET4 1.14 04/11/2016   FREET4 1.97 (H) 02/26/2016   FREET4 2.1 (H) 01/08/2016   FREET4 1.27 05/29/2015    Lab Results  Component Value Date   TSI 463 (H) 02/26/2016  Elevated Graves antibodies + overt thyrotoxicosis >> dx of Graves' disease.   Pt denies: - feeling nodules in neck - hoarseness - dysphagia - choking - SOB with lying down  Pt denies: - weight loss Or weight gain - He has both cold and heat intolerance - tremors - palpitations - anxiety - hair loss  Pt does not have a FH of thyroid ds. No FH of thyroid cancer. No h/o  radiation tx to head or neck.  No seaweed or kelp. No recent contrast studies. No herbal supplements. No Biotin use. No recent steroids use.   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  Lab Results  Component Value Date   HGBA1C 7.3 12/19/2016   He just had several surgeries - OU DR. 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   ROS: Constitutional: no weight gain/no weight loss, + fatigue, + subjective hyperthermia, + subjective hypothermia, + nocturia Eyes: + blurry vision, no xerophthalmia ENT: no sore throat, no nodules palpated in throat, no dysphagia, no odynophagia, no hoarseness Cardiovascular: + back and CP/no SOB/no palpitations/+ hand swelling Respiratory: + cough/no SOB/no wheezing Gastrointestinal: + all: Nausea, vomiting, diarrhea, constipation, heartburn  Musculoskeletal: + muscle aches/+ joint aches Skin: no rashes, no hair loss Neurological: no tremors/no numbness/no tingling/no dizziness, + HA  I reviewed pt's medications, allergies, PMH, social hx, family hx, and changes were documented in the history of present illness. Otherwise, unchanged from my initial visit note.  Past Medical History:  Diagnosis Date  . Anemia   . Arthritis   . Chronic kidney disease   . Diabetes mellitus   . Graves disease 2014  . Headache   .  Hypertension   . Non-compliance   . Shortness of breath dyspnea    Past Surgical History:  Procedure Laterality Date  . CIRCUMCISION     as a child, around 46 years of age  . EYE SURGERY    . PARS PLANA VITRECTOMY Right 03/25/2016   Procedure: PARS PLANA VITRECTOMY 25 GAUGE FOR ENDOPHTHALMITIS;  Surgeon: Jalene Mullet, MD;  Location: [facility];  Service: Ophthalmology;  Laterality: Right;  . WISDOM TOOTH EXTRACTION     Social History   Social History  . Marital Status: Married    Spouse Name: N/A  . Number of Children: 5: 46-4 y/o (2017)   Occupational History  . Chef-cook   Social History  Main Topics  . Smoking status: Former Smoker -- 0.75 packs/day for 3 years    Types: Cigarettes    Quit date: 2004  . Smokeless tobacco: Not on file  . Alcohol Use:      Comment: 1 beer every night  . Drug Use: No   Current Outpatient Prescriptions on File Prior to Visit  Medication Sig Dispense Refill  . acetaminophen (TYLENOL) 325 MG tablet Take 650 mg by mouth every 6 (six) hours as needed for mild pain.    Marland Kitchen allopurinol (ZYLOPRIM) 100 MG tablet Take 1 tablet (100 mg total) by mouth daily. 30 tablet 2  . amLODipine (NORVASC) 10 MG tablet Take 1 tablet (10 mg total) by mouth daily. 30 tablet 3  . Blood Glucose Monitoring Suppl (TRUE METRIX METER) W/DEVICE KIT Check blood sugar before meals and at bedtime 1 kit 0  . gabapentin (NEURONTIN) 100 MG capsule Take 1 capsule (100 mg total) by mouth 3 (three) times daily. 90 capsule 3  . glucose blood (TRUE METRIX BLOOD GLUCOSE TEST) test strip Use as instructed 100 each 12  . glyBURIDE (DIABETA) 5 MG tablet Take 1 tablet (5 mg total) by mouth daily with breakfast. 30 tablet 3  . HYDROcodone-acetaminophen (NORCO/VICODIN) 5-325 MG tablet Take 1-2 tablets by mouth every 4 (four) hours as needed. (Patient not taking: Reported on 02/21/2017) 10 tablet 0  . methimazole (TAPAZOLE) 5 MG tablet Take by mouth 10 mg in am and 10 mg in pm 180 tablet 1  . methocarbamol (ROBAXIN) 500 MG tablet Take 1 tablet (500 mg total) by mouth every 8 (eight) hours as needed for muscle spasms. 60 tablet 2  . metoprolol succinate (TOPROL XL) 50 MG 24 hr tablet Take 1 tablet (50 mg total) by mouth daily. Take with or immediately following a meal. 60 tablet 3  . NONFORMULARY OR COMPOUNDED ITEM Apply 1-2 g topically 4 (four) times daily. Peripheral Neuropathy cream: Bupivacaine 1% Doxepin 3% Gabapentin 6% Ibuprofen 3% Pentoxifylline 3% 120 each 2  . ofloxacin (OCUFLOX) 0.3 % ophthalmic solution Place 1 drop into the right eye 4 (four) times daily.  0  . pravastatin (PRAVACHOL) 20  MG tablet Take 1 tablet (20 mg total) by mouth daily. 90 tablet 3  . sildenafil (VIAGRA) 100 MG tablet Take 1 tablet (100 mg total) by mouth daily as needed for erectile dysfunction. If no effect, may double dose next day/time 30 tablet 3  . traMADol (ULTRAM) 50 MG tablet Take 1 tablet (50 mg total) by mouth every 8 (eight) hours as needed for severe pain. 45 tablet 0  . TRUEPLUS LANCETS 28G MISC Check blood sugar before meals and at bedtime 100 each 11  . VOLTAREN 1 % GEL Apply 2 g topically 4 (four) times daily. 100 g  2   No current facility-administered medications on file prior to visit.    No Known Allergies Family History  Problem Relation Age of Onset  . Hypertension Mother   . Diabetes Mother    PE: BP 130/86   Pulse 71   Wt 215 lb (97.5 kg)   SpO2 98%   BMI 29.16 kg/m   Wt Readings from Last 3 Encounters:  05/12/17 215 lb (97.5 kg)  02/21/17 219 lb (99.3 kg)  01/17/17 219 lb (99.3 kg)   Constitutional: overweight, in NAD Eyes: PERRLA, EOMI, + exophthalmos, No lid lag or stare ENT: moist mucous membranes, no thyromegaly - but left lobe slightly more prominent, no thyroid bruits, no cervical lymphadenopathy Cardiovascular: RRR, No MRG Respiratory: CTA B Gastrointestinal: abdomen soft, NT, ND, BS+ Musculoskeletal: no deformities, strength intact in all 4 Skin: moist, warm, no rashes Neurological: no tremor with outstretched hands, DTR normal in all 4  ASSESSMENT: 1. 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 Patient with Berenice Primas' disease dating since at least 2014, initially with thyrotoxic symptoms: Weight loss, heat intolerance, fatigue, generalized pain, insomnia, now only with generalized pain, unrelated to his thyroid condition. We diagnosed Graves' disease based on thyrotoxicosis timeline and elevated TSIs. We started methimazole, but he repeatedly was not compliant with the medication. At this visit, we had a long discussion about the consequences  of his not taking seriously his Graves' disease. I underlined the risk for cardiac disease including arrhythmia and sudden death, also increased risk of clotting including stroke and heart attacks. I strongly advised him to go ahead with radioactive iodine treatment (we discussed about protocols for isolation after the treatment, and also possible consequences) or surgery. He reluctantly agrees with radioactive iodine treatment. He is aware that he will need to be on levothyroxine for the rest of his life and he cannot miss doses. We also discussed at last visit about the need for compliance, but I am considering discharging him from the practice if he is not starting to at least attempt to control his disease -  for now, I advised him to remain on methimazole 10 mg in a.m., since this is the only dose that he is most likely to take  - will check his TFTs today - RTC in 3 mo  Needs to be called with results.  - time spent with the patient: 30 min of which >50% was spent in obtaining information about his symptoms, reviewing his previous labs, evaluations, and treatments, counseling him about his condition and trying to convince him about the absolute need to be compliant with his treatment and also discussed different treatment options for his Graves' disease  Component     Latest Ref Rng & Units 05/12/2017  TSH     0.35 - 4.50 uIU/mL <0.01 (L)  T4,Free(Direct)     0.60 - 1.60 ng/dL 1.69 (H)  Triiodothyronine,Free,Serum     2.3 - 4.2 pg/mL 4.2   TFTs worse >> will add 5 mg MMI in am (will advise to take this every day) and order the uptake and scan in prep for RAI tx.  NM THYROID SNG UPTAKE W/IMAGING  Order: [id number]  Status:  Final result Visible to patient:  No (Not Released) Dx:  Berenice Primas disease  Details   Reading Physician Reading Date Result Priority  Kerby Moors, MD 05/24/2017   Narrative    CLINICAL DATA: Grave's disease.  EXAM: THYROID SCAN AND UPTAKE - 24  HOURS  TECHNIQUE: Following the per oral administration of I-131 sodium iodide, the patient returned at 24 hours and uptake measurements were acquired with the uptake probe centered on the neck. Thyroid imaging was performed following the intravenous administration of the Tc-38mPertechnetate.  RADIOPHARMACEUTICALS: 12.9 MicroCuries I-131 sodium iodide orally and 11.9 mCi Technetium-949mertechnetate IV  COMPARISON: None  FINDINGS: The 24 hour radioactive iodine uptake is equal to 36.9%.  Mildly heterogeneous activity is identified within both lobes of the thyroid gland. A more focal area of increased radiotracer uptake localizes to the inferior pole of the left lobe. No dominant hot or cold nodules identified.  IMPRESSION: 1. Elevated 24 hour radioactive iodine uptake equal to 36.9%. 2. Heterogeneous tracer activity within both lobes of the thyroid gland suggesting multinodular goiter. 3.   Electronically Signed By: TaKerby Moors.D. On: 05/24/2017 16:30       Will order the RAI tx.  CrPhilemon KingdomMD PhD [facility]

## 2017-05-15 ENCOUNTER — Telehealth: Payer: Self-pay

## 2017-05-15 NOTE — Telephone Encounter (Signed)
Called patient and gave lab results. Patient had no questions or concerns.  

## 2017-05-15 NOTE — Telephone Encounter (Signed)
-----   Message from Philemon Kingdom, MD sent at 05/12/2017  4:51 PM EDT ----- Almyra Free, can you please call pt:  TFTs worse >> please ask him to add 5 mg MMI in pm (will advise to take this every day) and I ordered the uptake and scan  (at [facility]) in prep for RAI tx.  He will be called with the schedule for the uptake and scan and please remind him to stop the methimazole 4 days prior to the test. He can resume it immediately after the test.

## 2017-05-23 ENCOUNTER — Encounter (HOSPITAL_COMMUNITY)
Admission: RE | Admit: 2017-05-23 | Discharge: 2017-05-23 | Disposition: A | Discharge status: Home / Self Care | Payer: Medicaid Other | Source: Ambulatory Visit | Attending: Internal Medicine | Admitting: Internal Medicine

## 2017-05-23 DIAGNOSIS — E0500 Thyrotoxicosis with diffuse goiter without thyrotoxic crisis or storm: Secondary | ICD-10-CM | POA: Insufficient documentation

## 2017-05-23 MED ORDER — SODIUM IODIDE I 131 CAPSULE
12.9000 | Freq: Once | INTRAVENOUS | Status: AC | PRN
  Administered 2017-05-23: 12.9 via ORAL

## 2017-05-24 ENCOUNTER — Encounter (HOSPITAL_COMMUNITY)
Admission: RE | Admit: 2017-05-24 | Discharge: 2017-05-24 | Disposition: A | Discharge status: Home / Self Care | Payer: Medicaid Other | Source: Ambulatory Visit | Attending: Internal Medicine | Admitting: Internal Medicine

## 2017-05-24 DIAGNOSIS — E0500 Thyrotoxicosis with diffuse goiter without thyrotoxic crisis or storm: Secondary | ICD-10-CM | POA: Diagnosis present

## 2017-05-24 MED ORDER — SODIUM PERTECHNETATE TC 99M INJECTION
10.0000 | Freq: Once | INTRAVENOUS | Status: AC | PRN
  Administered 2017-05-24: 10 via INTRAVENOUS

## 2017-05-25 NOTE — Addendum Note (Signed)
Addended by: Philemon Kingdom on: 05/25/2017 12:40 PM   Modules accepted: Orders

## 2017-06-08 MED FILL — glyBURIDE 5 MG TABS: 5 | 30 days supply | Qty: 30 | Fill #1

## 2017-06-08 MED FILL — $VIAGRA 100 MG TABLET: 100 | 30 days supply | Qty: 10 | Fill #2

## 2017-06-16 ENCOUNTER — Encounter (HOSPITAL_COMMUNITY): Payer: Medicaid Other

## 2017-06-21 ENCOUNTER — Ambulatory Visit: Payer: Medicaid Other | Attending: Internal Medicine | Admitting: Physician Assistant

## 2017-06-21 VITALS — BP 128/80 | HR 89 | Temp 98.3°F | Resp 18 | Ht 72.0 in | Wt 222.0 lb

## 2017-06-21 DIAGNOSIS — E0500 Thyrotoxicosis with diffuse goiter without thyrotoxic crisis or storm: Secondary | ICD-10-CM | POA: Insufficient documentation

## 2017-06-21 DIAGNOSIS — E1122 Type 2 diabetes mellitus with diabetic chronic kidney disease: Secondary | ICD-10-CM | POA: Diagnosis not present

## 2017-06-21 DIAGNOSIS — IMO0002 Reserved for concepts with insufficient information to code with codable children: Secondary | ICD-10-CM

## 2017-06-21 DIAGNOSIS — G8929 Other chronic pain: Secondary | ICD-10-CM | POA: Diagnosis not present

## 2017-06-21 DIAGNOSIS — M25511 Pain in right shoulder: Secondary | ICD-10-CM | POA: Diagnosis not present

## 2017-06-21 DIAGNOSIS — M791 Myalgia: Secondary | ICD-10-CM | POA: Diagnosis not present

## 2017-06-21 DIAGNOSIS — R52 Pain, unspecified: Secondary | ICD-10-CM

## 2017-06-21 DIAGNOSIS — I10 Essential (primary) hypertension: Secondary | ICD-10-CM | POA: Diagnosis not present

## 2017-06-21 DIAGNOSIS — N189 Chronic kidney disease, unspecified: Secondary | ICD-10-CM | POA: Diagnosis not present

## 2017-06-21 DIAGNOSIS — E1165 Type 2 diabetes mellitus with hyperglycemia: Secondary | ICD-10-CM | POA: Diagnosis not present

## 2017-06-21 DIAGNOSIS — I129 Hypertensive chronic kidney disease with stage 1 through stage 4 chronic kidney disease, or unspecified chronic kidney disease: Secondary | ICD-10-CM | POA: Insufficient documentation

## 2017-06-21 DIAGNOSIS — N529 Male erectile dysfunction, unspecified: Secondary | ICD-10-CM | POA: Diagnosis not present

## 2017-06-21 DIAGNOSIS — M25561 Pain in right knee: Secondary | ICD-10-CM | POA: Diagnosis not present

## 2017-06-21 DIAGNOSIS — M19012 Primary osteoarthritis, left shoulder: Secondary | ICD-10-CM | POA: Insufficient documentation

## 2017-06-21 DIAGNOSIS — M25512 Pain in left shoulder: Secondary | ICD-10-CM | POA: Insufficient documentation

## 2017-06-21 DIAGNOSIS — R6882 Decreased libido: Secondary | ICD-10-CM | POA: Insufficient documentation

## 2017-06-21 DIAGNOSIS — E118 Type 2 diabetes mellitus with unspecified complications: Secondary | ICD-10-CM

## 2017-06-21 DIAGNOSIS — M25562 Pain in left knee: Secondary | ICD-10-CM | POA: Diagnosis not present

## 2017-06-21 DIAGNOSIS — M19011 Primary osteoarthritis, right shoulder: Secondary | ICD-10-CM | POA: Diagnosis not present

## 2017-06-21 LAB — POCT GLYCOSYLATED HEMOGLOBIN (HGB A1C): HEMOGLOBIN A1C: 6.1

## 2017-06-21 LAB — GLUCOSE, POCT (MANUAL RESULT ENTRY): POC GLUCOSE: 225 mg/dL — AB (ref 70–99)

## 2017-06-21 MED ORDER — VOLTAREN 1 % TD GEL: 2.0000 g | Freq: Four times a day (QID) | TRANSDERMAL | 2 refills | Status: DC

## 2017-06-21 NOTE — Patient Instructions (Signed)
Check blood pressure 3 times weekly and record and bring to next visit.  Diabetes Mellitus and Food It is important for you to manage your blood sugar (glucose) level. Your blood glucose level can be greatly affected by what you eat. Eating healthier foods in the appropriate amounts throughout the day at about the same time each day will help you control your blood glucose level. It can also help slow or prevent worsening of your diabetes mellitus. Healthy eating may even help you improve the level of your blood pressure and reach or maintain a healthy weight. General recommendations for healthful eating and cooking habits include:  Eating meals and snacks regularly. Avoid going long periods of time without eating to lose weight.  Eating a diet that consists mainly of plant-based foods, such as fruits, vegetables, nuts, legumes, and whole grains.  Using low-heat cooking methods, such as baking, instead of high-heat cooking methods, such as deep frying.  Work with your dietitian to make sure you understand how to use the Nutrition Facts information on food labels. How can food affect me? Carbohydrates Carbohydrates affect your blood glucose level more than any other type of food. Your dietitian will help you determine how many carbohydrates to eat at each meal and teach you how to count carbohydrates. Counting carbohydrates is important to keep your blood glucose at a healthy level, especially if you are using insulin or taking certain medicines for diabetes mellitus. Alcohol Alcohol can cause sudden decreases in blood glucose (hypoglycemia), especially if you use insulin or take certain medicines for diabetes mellitus. Hypoglycemia can be a life-threatening condition. Symptoms of hypoglycemia (sleepiness, dizziness, and disorientation) are similar to symptoms of having too much alcohol. If your health care provider has given you approval to drink alcohol, do so in moderation and use the following  guidelines:  Women should not have more than one drink per day, and men should not have more than two drinks per day. One drink is equal to: ? 12 oz of beer. ? 5 oz of wine. ? 1 oz of hard liquor.  Do not drink on an empty stomach.  Keep yourself hydrated. Have water, diet soda, or unsweetened iced tea.  Regular soda, juice, and other mixers might contain a lot of carbohydrates and should be counted.  What foods are not recommended? As you make food choices, it is important to remember that all foods are not the same. Some foods have fewer nutrients per serving than other foods, even though they might have the same number of calories or carbohydrates. It is difficult to get your body what it needs when you eat foods with fewer nutrients. Examples of foods that you should avoid that are high in calories and carbohydrates but low in nutrients include:  Trans fats (most processed foods list trans fats on the Nutrition Facts label).  Regular soda.  Juice.  Candy.  Sweets, such as cake, pie, doughnuts, and cookies.  Fried foods.  What foods can I eat? Eat nutrient-rich foods, which will nourish your body and keep you healthy. The food you should eat also will depend on several factors, including:  The calories you need.  The medicines you take.  Your weight.  Your blood glucose level.  Your blood pressure level.  Your cholesterol level.  You should eat a variety of foods, including:  Protein. ? Lean cuts of meat. ? Proteins low in saturated fats, such as fish, egg whites, and beans. Avoid processed meats.  Fruits and vegetables. ?  Fruits and vegetables that may help control blood glucose levels, such as apples, mangoes, and yams.  Dairy products. ? Choose fat-free or low-fat dairy products, such as milk, yogurt, and cheese.  Grains, bread, pasta, and rice. ? Choose whole grain products, such as multigrain bread, whole oats, and brown rice. These foods may help  control blood pressure.  Fats. ? Foods containing healthful fats, such as nuts, avocado, olive oil, canola oil, and fish.  Does everyone with diabetes mellitus have the same meal plan? Because every person with diabetes mellitus is different, there is not one meal plan that works for everyone. It is very important that you meet with a dietitian who will help you create a meal plan that is just right for you. This information is not intended to replace advice given to you by your health care provider. Make sure you discuss any questions you have with your health care provider. Document Released: 06/16/2005 Document Revised: 02/25/2016 Document Reviewed: 08/16/2013 Elsevier Interactive Patient Education  2017 [employer].

## 2017-06-21 NOTE — Progress Notes (Signed)
Edward [name], is a 46 y.o. male  MVV:[id number]  PYY:[id number]  DOB - [birth year]  Subjective:  Chief Complaint and HPI: Edward [name] is a 46 y.o. male here today for multiple issues. 1- He has not been taking BP or diabetes meds for more than 3 months now.  He doesn't like/want to take meds.  2- No HA, No CP.  No s/sx hyper/hypoglycenmia.  Sometimes checks blood sugar and its always under 150.  BP OOO has been normal not even on meds. He has been more active and trying to eat better 2-chronic pain-LBP, B shoulder pain with arthritis, B knee pain-doesn't want to be on narcotics-previously managed at [facility].  Has chronic kidney dz so NSAIDS not a good option.  He has also been seen by ortho.  3-Decreased libido and erection issues.  Trouble obtaining and maintaining erection.  Viagra gives him a HA and doesn't always help.    Social History:  Has 4 kids ages 76 down to age 45.  ROS:   Constitutional:  No f/c, No night sweats, No unexplained weight loss. EENT:  No vision changes, No blurry vision, No hearing changes. No mouth, throat, or ear problems.  Respiratory: No cough, No SOB Cardiac: No CP, no palpitations GI:  No abd pain, No N/V/D. GU: No Urinary s/sx Musculoskeletal: pain as above Neuro: No headache, no dizziness, no motor weakness.  Skin: No rash Endocrine:  No polydipsia. No polyuria.  Psych: Denies SI/HI  No problems updated.  ALLERGIES: No Known Allergies  PAST MEDICAL HISTORY: Past Medical History:  Diagnosis Date  . Anemia   . Arthritis   . Chronic kidney disease   . Diabetes mellitus   . Graves disease 2014  . Headache   . Hypertension   . Non-compliance   . Shortness of breath dyspnea     MEDICATIONS AT HOME: Prior to Admission medications   Medication Sig Start Date End Date Taking? Authorizing Provider  acetaminophen (TYLENOL) 325 MG tablet Take 650 mg by mouth every 6 (six) hours as needed for mild pain.    [provider]  allopurinol (ZYLOPRIM) 100 MG tablet Take 1 tablet (100 mg total) by mouth daily. 12/19/16   Maren Reamer, MD  amLODipine (NORVASC) 10 MG tablet Take 1 tablet (10 mg total) by mouth daily. 12/19/16   Maren Reamer, MD  Blood Glucose Monitoring Suppl (TRUE METRIX METER) W/DEVICE KIT Check blood sugar before meals and at bedtime 05/29/15   Chari Manning A, NP  gabapentin (NEURONTIN) 100 MG capsule Take 1 capsule (100 mg total) by mouth 3 (three) times daily. 12/19/16   Lottie Mussel T, MD  glucose blood (TRUE METRIX BLOOD GLUCOSE TEST) test strip Use as instructed 05/29/15   Lance Bosch, NP  glyBURIDE (DIABETA) 5 MG tablet Take 1 tablet (5 mg total) by mouth daily with breakfast. 12/19/16   Maren Reamer, MD  HYDROcodone-acetaminophen (NORCO/VICODIN) 5-325 MG tablet Take 1-2 tablets by mouth every 4 (four) hours as needed. Patient not taking: Reported on 02/21/2017 06/20/16   Recardo Evangelist, PA-C  methimazole (TAPAZOLE) 5 MG tablet Take by mouth 10 mg in am and 5 mg in pm 05/12/17   Philemon Kingdom, MD  methocarbamol (ROBAXIN) 500 MG tablet Take 1 tablet (500 mg total) by mouth every 8 (eight) hours as needed for muscle spasms. 12/19/16   Maren Reamer, MD  metoprolol succinate (TOPROL XL) 50 MG 24 hr tablet Take 1 tablet (50 mg  total) by mouth daily. Take with or immediately following a meal. 12/19/16   Langeland, Dawn T, MD  NONFORMULARY OR COMPOUNDED ITEM Apply 1-2 g topically 4 (four) times daily. Peripheral Neuropathy cream: Bupivacaine 1% Doxepin 3% Gabapentin 6% Ibuprofen 3% Pentoxifylline 3% 10/17/16   Edrick Kins, DPM  ofloxacin (OCUFLOX) 0.3 % ophthalmic solution Place 1 drop into the right eye 4 (four) times daily. 03/22/16   [provider]  pravastatin (PRAVACHOL) 20 MG tablet Take 1 tablet (20 mg total) by mouth daily. 12/19/16   Maren Reamer, MD  sildenafil (VIAGRA) 100 MG tablet Take 1 tablet (100 mg total) by mouth daily as needed for erectile  dysfunction. If no effect, may double dose next day/time 12/19/16   Tresa Garter, MD  traMADol (ULTRAM) 50 MG tablet Take 1 tablet (50 mg total) by mouth every 8 (eight) hours as needed for severe pain. 12/19/16   Maren Reamer, MD  TRUEPLUS LANCETS 28G MISC Check blood sugar before meals and at bedtime 05/29/15   Chari Manning A, NP  VOLTAREN 1 % GEL Apply 2 g topically 4 (four) times daily. 06/21/17   Argentina Donovan, PA-C     Objective:  EXAM:   Vitals:   06/21/17 0909  BP: 128/80  Pulse: 89  Resp: 18  Temp: 98.3 F (36.8 C)  TempSrc: Oral  SpO2: 96%  Weight: 222 lb (100.7 kg)  Height: 6' (1.829 m)    General appearance : A&OX3. NAD. Non-toxic-appearing HEENT: Atraumatic and Normocephalic.  PERRLA. EOM intact.   Neck: supple, no JVD. No cervical lymphadenopathy. No thyromegaly Chest/Lungs:  Breathing-non-labored, Good air entry bilaterally, breath sounds normal without rales, rhonchi, or wheezing  CVS: S1 S2 regular, no murmurs, gallops, rubs  Extremities: Bilateral Lower Ext shows no edema, both legs are warm to touch with = pulse throughout Neurology:  CN II-XII grossly intact, Non focal.   Psych:  TP linear. J/I WNL. Normal speech. Appropriate eye contact and affect.  Skin:  No Rash  Data Review Lab Results  Component Value Date   HGBA1C 6.1 06/21/2017   HGBA1C 7.3 12/19/2016   HGBA1C 7.2 09/19/2016     Assessment & Plan   1. Improving controlled type 2 diabetes mellitus with complication, without long-term current use of insulin ([place]) He can continue to hold diabetic medications since he hasn't been taking them for more than 3 months and his A1C has improved by more than a point at 6.1 today.  Continue to work on diet and exercise.  I will defer resuming meds to his next visit with his new PCP.   - POCT glycosylated hemoglobin (Hb A1C) - Glucose (CBG) - Comprehensive metabolic panel  2. Controlled hypertension Not on meds.  Check BP OOO 3 times/week  and record and bring to next OV.  Continue to hold meds if BP <130/85.   - Comprehensive metabolic panel  3. Body aches Schedule an appt with ortho that he has already established with for follow-up and long term solutions - VOLTAREN 1 % GEL; Apply 2 g topically 4 (four) times daily.  Dispense: 100 g; Refill: 2  4. Erectile dysfunction, unspecified erectile dysfunction type - Testosterone, Free, Total, SHBG  5. Decreased libido - Testosterone, Free, Total, SHBG  Spent >30 mins counseling on all the above in addition to Patient have been counseled extensively about nutrition and exercise.  Return in about 4 weeks (around 07/18/2017) for keep appt with Dr Jarold Song.  The patient was  given clear instructions to go to ER or return to medical center if symptoms don't improve, worsen or new problems develop. The patient verbalized understanding. The patient was told to call to get lab results if they haven't heard anything in the next week.     Freeman Caldron, PA-C [facility] [place], [place]   06/21/2017, 9:45 AMPatient ID: Edward [name], male   DOB: [birth year], 46 y.o.   MRN: [id number]

## 2017-06-22 ENCOUNTER — Other Ambulatory Visit: Payer: Self-pay | Admitting: Physician Assistant

## 2017-06-22 DIAGNOSIS — N289 Disorder of kidney and ureter, unspecified: Secondary | ICD-10-CM

## 2017-06-23 ENCOUNTER — Encounter (HOSPITAL_COMMUNITY)
Admission: RE | Admit: 2017-06-23 | Discharge: 2017-06-23 | Disposition: A | Discharge status: Home / Self Care | Payer: Medicaid Other | Source: Ambulatory Visit | Attending: Internal Medicine | Admitting: Internal Medicine

## 2017-06-23 DIAGNOSIS — E0500 Thyrotoxicosis with diffuse goiter without thyrotoxic crisis or storm: Secondary | ICD-10-CM | POA: Diagnosis present

## 2017-06-23 LAB — COMPREHENSIVE METABOLIC PANEL
A/G RATIO: 1.4 (ref 1.2–2.2)
ALT: 41 IU/L (ref 0–44)
AST: 28 IU/L (ref 0–40)
Albumin: 3.7 g/dL (ref 3.5–5.5)
Alkaline Phosphatase: 119 IU/L — ABNORMAL HIGH (ref 39–117)
BUN/Creatinine Ratio: 18 (ref 9–20)
BUN: 49 mg/dL — ABNORMAL HIGH (ref 6–24)
CALCIUM: 8.3 mg/dL — AB (ref 8.7–10.2)
CHLORIDE: 108 mmol/L — AB (ref 96–106)
CO2: 15 mmol/L — ABNORMAL LOW (ref 20–29)
Creatinine, Ser: 2.79 mg/dL — ABNORMAL HIGH (ref 0.76–1.27)
GFR calc Af Amer: 30 mL/min/{1.73_m2} — ABNORMAL LOW (ref >59)
GFR, EST NON AFRICAN AMERICAN: 26 mL/min/{1.73_m2} — AB (ref >59)
Globulin, Total: 2.7 g/dL (ref 1.5–4.5)
Glucose: 200 mg/dL — ABNORMAL HIGH (ref 65–99)
POTASSIUM: 6.2 mmol/L — AB (ref 3.5–5.2)
SODIUM: 136 mmol/L (ref 134–144)
Total Protein: 6.4 g/dL (ref 6.0–8.5)

## 2017-06-23 LAB — TESTOSTERONE, FREE, TOTAL, SHBG
Sex Hormone Binding: 54.7 nmol/L (ref 16.5–55.9)
TESTOSTERONE FREE: 7.5 pg/mL (ref 6.8–21.5)
TESTOSTERONE: 499 ng/dL (ref 264–916)

## 2017-06-23 MED ORDER — SODIUM IODIDE I 131 CAPSULE
23.5000 | Freq: Once | INTRAVENOUS | Status: AC | PRN
  Administered 2017-06-23: 23.5 via ORAL

## 2017-06-26 ENCOUNTER — Telehealth: Payer: Self-pay | Admitting: *Deleted

## 2017-06-26 NOTE — Telephone Encounter (Signed)
Patient verified DOB Patient is aware of kidney function worsening and being referred to a kidney specialist. Patient is aware of poor kidney function impacting the DM levels. Patient is advised to limit salt and sugar intake and to check his sugar levels fasting and at bedtime. Readings should be brought to the next appointment. Patient is also aware of testosterone levels being normal. No further questions at this time.

## 2017-06-26 NOTE — Telephone Encounter (Signed)
-----   Message from Argentina Donovan, [place] sent at 06/22/2017  3:30 PM EDT ----- Please call patient.  His kidney function has worsened.  It is likely that his diabetes has not improved and his readings are compromised by his poor kidney function.  It is imperative that he eat a healthy diet and limit sugar and salt.  I am referring him to a kidney specialist.  Have him check his blood sugars fasting and at bedtime and record them and follow-up as planned.  Bring blood sugar readings to next appt.  His testosterone levels appear normal.   Jethro Poling, PA-C

## 2017-07-18 ENCOUNTER — Ambulatory Visit: Payer: Medicaid Other | Attending: Family Medicine | Admitting: Family Medicine

## 2017-07-18 ENCOUNTER — Encounter: Payer: Self-pay | Admitting: Family Medicine

## 2017-07-18 VITALS — BP 176/93 | HR 82 | Temp 98.4°F | Ht 72.0 in | Wt 220.2 lb

## 2017-07-18 DIAGNOSIS — E0500 Thyrotoxicosis with diffuse goiter without thyrotoxic crisis or storm: Secondary | ICD-10-CM | POA: Diagnosis not present

## 2017-07-18 DIAGNOSIS — N185 Chronic kidney disease, stage 5: Secondary | ICD-10-CM | POA: Insufficient documentation

## 2017-07-18 DIAGNOSIS — Z91199 Patient's noncompliance with other medical treatment and regimen due to unspecified reason: Secondary | ICD-10-CM

## 2017-07-18 DIAGNOSIS — N529 Male erectile dysfunction, unspecified: Secondary | ICD-10-CM | POA: Insufficient documentation

## 2017-07-18 DIAGNOSIS — E875 Hyperkalemia: Secondary | ICD-10-CM | POA: Diagnosis not present

## 2017-07-18 DIAGNOSIS — E118 Type 2 diabetes mellitus with unspecified complications: Secondary | ICD-10-CM

## 2017-07-18 DIAGNOSIS — M25561 Pain in right knee: Secondary | ICD-10-CM | POA: Diagnosis not present

## 2017-07-18 DIAGNOSIS — E1122 Type 2 diabetes mellitus with diabetic chronic kidney disease: Secondary | ICD-10-CM | POA: Insufficient documentation

## 2017-07-18 DIAGNOSIS — N528 Other male erectile dysfunction: Secondary | ICD-10-CM

## 2017-07-18 DIAGNOSIS — M2550 Pain in unspecified joint: Secondary | ICD-10-CM | POA: Insufficient documentation

## 2017-07-18 DIAGNOSIS — E1165 Type 2 diabetes mellitus with hyperglycemia: Secondary | ICD-10-CM | POA: Diagnosis not present

## 2017-07-18 DIAGNOSIS — M542 Cervicalgia: Secondary | ICD-10-CM | POA: Diagnosis not present

## 2017-07-18 DIAGNOSIS — I10 Essential (primary) hypertension: Secondary | ICD-10-CM

## 2017-07-18 DIAGNOSIS — Z9119 Patient's noncompliance with other medical treatment and regimen: Secondary | ICD-10-CM

## 2017-07-18 DIAGNOSIS — M25562 Pain in left knee: Secondary | ICD-10-CM | POA: Insufficient documentation

## 2017-07-18 DIAGNOSIS — N183 Chronic kidney disease, stage 3 unspecified: Secondary | ICD-10-CM

## 2017-07-18 DIAGNOSIS — M549 Dorsalgia, unspecified: Secondary | ICD-10-CM | POA: Insufficient documentation

## 2017-07-18 DIAGNOSIS — IMO0002 Reserved for concepts with insufficient information to code with codable children: Secondary | ICD-10-CM

## 2017-07-18 DIAGNOSIS — Z9889 Other specified postprocedural states: Secondary | ICD-10-CM | POA: Insufficient documentation

## 2017-07-18 DIAGNOSIS — Z9114 Patient's other noncompliance with medication regimen: Secondary | ICD-10-CM | POA: Insufficient documentation

## 2017-07-18 DIAGNOSIS — I129 Hypertensive chronic kidney disease with stage 1 through stage 4 chronic kidney disease, or unspecified chronic kidney disease: Secondary | ICD-10-CM | POA: Diagnosis present

## 2017-07-18 DIAGNOSIS — M1990 Unspecified osteoarthritis, unspecified site: Secondary | ICD-10-CM | POA: Insufficient documentation

## 2017-07-18 DIAGNOSIS — N189 Chronic kidney disease, unspecified: Secondary | ICD-10-CM | POA: Insufficient documentation

## 2017-07-18 LAB — GLUCOSE, POCT (MANUAL RESULT ENTRY): POC Glucose: 116 mg/dl — AB (ref 70–99)

## 2017-07-18 MED ORDER — ATORVASTATIN CALCIUM 20 MG PO TABS: 20.0000 mg | ORAL_TABLET | Freq: Every day | ORAL | 3 refills | Status: DC

## 2017-07-18 NOTE — Progress Notes (Signed)
Pt has not been taking any medication.

## 2017-07-18 NOTE — Progress Notes (Signed)
Subjective:  Patient ID: Edward [name], male    DOB: 12/11/1970  Age: 46 y.o. MRN: [id number]  CC: Diabetes and Hypertension   HPI Edward [name] is a 46 year old male with a history of hypertension, type 2 diabetes mellitus (A1c 6.1), Graves' disease (status post radioactive iodine treatments), stage III chronic kidney disease, erectile dysfunction who presents today to establish with me.  His blood pressure is elevated but he informs me that this is due to his having argued with his wife just before this appointment. His last blood pressure at his previous visit was 128/80. He does not take his antihypertensives and does not take any medications for diabetes. He informs me he does not like to take medications.  For his chronic kidney disease he was referred to nephrology at his last visit and his referral is still pending.  On 06/23/17 he had his radioactive iodine therapy for Graves' disease and is yet to see his endocrinologist after that. His last visit with her (Dr Renne Crigler) was on 05/2017.  He continues to complain of pain in his back, neck, knees which she has had for years and this is associated with intermittent swelling which is worse on rainy days. He states he also has a bulging lumbar disc but denies radiation of symptoms. Knee x-rays revealed no degenerative changes; left shoulder x-ray revealed minimal AC degenerative changes, inferior glenoid spur. I have reviewed his labs which reveal elevated sedimentation rate and CRP but neg RF. He currently uses Voltaren gel on his joints with minimal improvement.  He complains he still has erectile dysfunction despite using Viagra. Also notes that he does have some increased heartburn with the use of Viagra which he has run out of at this time. Testosterone levels ordered at last visit were normal.  Past Medical History:  Diagnosis Date  . Anemia   . Arthritis   . Chronic kidney disease   . Diabetes mellitus   . Graves  disease 2014  . Headache   . Hypertension   . Non-compliance   . Shortness of breath dyspnea     Past Surgical History:  Procedure Laterality Date  . CIRCUMCISION     as a child, around 89 years of age  . EYE SURGERY    . PARS PLANA VITRECTOMY Right 03/25/2016   Procedure: PARS PLANA VITRECTOMY 25 GAUGE FOR ENDOPHTHALMITIS;  Surgeon: Jalene Mullet, MD;  Location: [facility];  Service: Ophthalmology;  Laterality: Right;  . WISDOM TOOTH EXTRACTION      No Known Allergies   Outpatient Medications Prior to Visit  Medication Sig Dispense Refill  . acetaminophen (TYLENOL) 325 MG tablet Take 650 mg by mouth every 6 (six) hours as needed for mild pain.    Marland Kitchen allopurinol (ZYLOPRIM) 100 MG tablet Take 1 tablet (100 mg total) by mouth daily. (Patient not taking: Reported on 07/18/2017) 30 tablet 2  . amLODipine (NORVASC) 10 MG tablet Take 1 tablet (10 mg total) by mouth daily. (Patient not taking: Reported on 07/18/2017) 30 tablet 3  . Blood Glucose Monitoring Suppl (TRUE METRIX METER) W/DEVICE KIT Check blood sugar before meals and at bedtime (Patient not taking: Reported on 07/18/2017) 1 kit 0  . gabapentin (NEURONTIN) 100 MG capsule Take 1 capsule (100 mg total) by mouth 3 (three) times daily. (Patient not taking: Reported on 07/18/2017) 90 capsule 3  . glucose blood (TRUE METRIX BLOOD GLUCOSE TEST) test strip Use as instructed (Patient not taking: Reported on 07/18/2017) 100 each 12  .  methimazole (TAPAZOLE) 5 MG tablet Take by mouth 10 mg in am and 5 mg in pm (Patient not taking: Reported on 07/18/2017) 180 tablet 1  . metoprolol succinate (TOPROL XL) 50 MG 24 hr tablet Take 1 tablet (50 mg total) by mouth daily. Take with or immediately following a meal. (Patient not taking: Reported on 07/18/2017) 60 tablet 3  . NONFORMULARY OR COMPOUNDED ITEM Apply 1-2 g topically 4 (four) times daily. Peripheral Neuropathy cream: Bupivacaine 1% Doxepin 3% Gabapentin 6% Ibuprofen 3% Pentoxifylline 3% (Patient not  taking: Reported on 07/18/2017) 120 each 2  . ofloxacin (OCUFLOX) 0.3 % ophthalmic solution Place 1 drop into the right eye 4 (four) times daily.  0  . sildenafil (VIAGRA) 100 MG tablet Take 1 tablet (100 mg total) by mouth daily as needed for erectile dysfunction. If no effect, may double dose next day/time (Patient not taking: Reported on 07/18/2017) 30 tablet 3  . TRUEPLUS LANCETS 28G MISC Check blood sugar before meals and at bedtime (Patient not taking: Reported on 07/18/2017) 100 each 11  . VOLTAREN 1 % GEL Apply 2 g topically 4 (four) times daily. (Patient not taking: Reported on 07/18/2017) 100 g 2  . glyBURIDE (DIABETA) 5 MG tablet Take 1 tablet (5 mg total) by mouth daily with breakfast. (Patient not taking: Reported on 07/18/2017) 30 tablet 3  . HYDROcodone-acetaminophen (NORCO/VICODIN) 5-325 MG tablet Take 1-2 tablets by mouth every 4 (four) hours as needed. (Patient not taking: Reported on 02/21/2017) 10 tablet 0  . methocarbamol (ROBAXIN) 500 MG tablet Take 1 tablet (500 mg total) by mouth every 8 (eight) hours as needed for muscle spasms. (Patient not taking: Reported on 07/18/2017) 60 tablet 2  . pravastatin (PRAVACHOL) 20 MG tablet Take 1 tablet (20 mg total) by mouth daily. (Patient not taking: Reported on 07/18/2017) 90 tablet 3  . traMADol (ULTRAM) 50 MG tablet Take 1 tablet (50 mg total) by mouth every 8 (eight) hours as needed for severe pain. (Patient not taking: Reported on 07/18/2017) 45 tablet 0   No facility-administered medications prior to visit.     ROS Review of Systems  Constitutional: Negative for activity change and appetite change.  HENT: Negative for sinus pressure and sore throat.   Eyes: Negative for visual disturbance.  Respiratory: Negative for cough, chest tightness and shortness of breath.   Cardiovascular: Negative for chest pain and leg swelling.  Gastrointestinal: Negative for abdominal distention, abdominal pain, constipation and diarrhea.    Endocrine: Negative.   Genitourinary: Negative for dysuria.  Musculoskeletal:       See history of present illness  Skin: Negative for rash.  Allergic/Immunologic: Negative.   Neurological: Negative for weakness, light-headedness and numbness.  Psychiatric/Behavioral: Negative for dysphoric mood and suicidal ideas.    Objective:  BP (!) 176/93   Pulse 82   Temp 98.4 F (36.9 C) (Oral)   Ht 6' (1.829 m)   Wt 220 lb 3.2 oz (99.9 kg)   SpO2 100%   BMI 29.86 kg/m   BP/Weight 07/18/2017 06/21/2017 4/78/2956  Systolic BP 213 086 578  Diastolic BP 93 80 86  Wt. (Lbs) 220.2 222 215  BMI 29.86 30.11 29.16      Physical Exam  Constitutional: He is oriented to person, place, and time. He appears well-developed and well-nourished.  Cardiovascular: Normal rate, normal heart sounds and intact distal pulses.   No murmur heard. Pulmonary/Chest: Effort normal and breath sounds normal. He has no wheezes. He has no rales. He exhibits  no tenderness.  Abdominal: Soft. Bowel sounds are normal. He exhibits no distension and no mass. There is no tenderness.  Musculoskeletal: Normal range of motion. He exhibits tenderness (slight tenderness on palpation of lumbar spine. Positive straight leg raise on the right). He exhibits no edema (No edema of Hands or knees).  Tenderness on flexion and extension of the left knee.  Neurological: He is alert and oriented to person, place, and time.  Psychiatric: He has a normal mood and affect.     CMP Latest Ref Rng & Units 06/21/2017 12/19/2016 10/25/2016  Glucose 65 - 99 mg/dL 200(H) 101(H) 275(H)  BUN 6 - 24 mg/dL 49(H) 36(H) 36(H)  Creatinine 0.76 - 1.27 mg/dL 2.79(H) 2.27(H) 2.36(H)  Sodium 134 - 144 mmol/L 136 141 137  Potassium 3.5 - 5.2 mmol/L 6.2(H) 5.0 5.0  Chloride 96 - 106 mmol/L 108(H) 112(H) 111(H)  CO2 20 - 29 mmol/L 15(L) 20 18(L)  Calcium 8.7 - 10.2 mg/dL 8.3(L) 8.8 8.4(L)  Total Protein 6.0 - 8.5 g/dL 6.4 - -  Total Bilirubin 0.0 - 1.2  mg/dL <0.2 - -  Alkaline Phos 39 - 117 IU/L 119(H) - -  AST 0 - 40 IU/L 28 - -  ALT 0 - 44 IU/L 41 - -    Lab Results  Component Value Date   HGBA1C 6.1 06/21/2017    Assessment & Plan:   1. Diabetes mellitus type 2, uncontrolled, with complications ([place]) Diet controlled with A1c of 6.1 Discontinue glipizide - POCT glucose (manual entry)  2. Uncontrolled hypertension Blood pressure at last visit was 128/80 Patient attributes current elevation to recent [place] with his wife Recheck blood pressure in 2 weeks Low-sodium, DASH diet We'll need to resume antihypertensives if blood pressure is elevated  3. Hyperkalemia Last potassium was 6.2 Repeat today  4. 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 Non compliance with medical treatment Emphasize the need to be compliant with medical treatments on the complications of noncompliance  6. Other male erectile dysfunction Not improved on Viagra Blood pressure is significantly elevated today-advised I will be unable to refill - Ambulatory referral to Urology  7. Graves disease Status post radioactive iodine treatments Noncompliance with methimazole Keep appointment with endocrine  8. Arthritis Workup negative for rheumatoid Unable to take oral NSAIDs due to a daily Continue Voltaren gel He declines additional muscle relaxants for the pain medications   Meds ordered this encounter  Medications  . atorvastatin (LIPITOR) 20 MG tablet    Sig: Take 1 tablet (20 mg total) by mouth daily.    Dispense:  30 tablet    Refill:  3    Follow-up: Return in about 2 weeks (around 08/01/2017) for Follow-up on hypertension.   Arnoldo Morale MD

## 2017-07-19 ENCOUNTER — Other Ambulatory Visit: Payer: Self-pay | Admitting: Family Medicine

## 2017-07-19 LAB — CMP14+EGFR
A/G RATIO: 1.2 (ref 1.2–2.2)
ALT: 33 IU/L (ref 0–44)
AST: 29 IU/L (ref 0–40)
Albumin: 3.8 g/dL (ref 3.5–5.5)
Alkaline Phosphatase: 134 IU/L — ABNORMAL HIGH (ref 39–117)
BILIRUBIN TOTAL: 0.3 mg/dL (ref 0.0–1.2)
BUN/Creatinine Ratio: 14 (ref 9–20)
BUN: 39 mg/dL — AB (ref 6–24)
CO2: 18 mmol/L — ABNORMAL LOW (ref 20–29)
Calcium: 9.1 mg/dL (ref 8.7–10.2)
Chloride: 114 mmol/L — ABNORMAL HIGH (ref 96–106)
Creatinine, Ser: 2.75 mg/dL — ABNORMAL HIGH (ref 0.76–1.27)
GFR calc non Af Amer: 26 mL/min/{1.73_m2} — ABNORMAL LOW (ref >59)
GFR, EST AFRICAN AMERICAN: 31 mL/min/{1.73_m2} — AB (ref >59)
GLUCOSE: 103 mg/dL — AB (ref 65–99)
Globulin, Total: 3.3 g/dL (ref 1.5–4.5)
POTASSIUM: 6.1 mmol/L — AB (ref 3.5–5.2)
Sodium: 143 mmol/L (ref 134–144)
TOTAL PROTEIN: 7.1 g/dL (ref 6.0–8.5)

## 2017-07-19 MED ORDER — SODIUM POLYSTYRENE SULFONATE 15 GM/60ML PO SUSP: 15.0000 g | Freq: Once | ORAL | 0 refills | Status: AC

## 2017-07-21 ENCOUNTER — Telehealth: Payer: Self-pay

## 2017-07-21 MED ORDER — SODIUM POLYSTYRENE SULFONATE 15 GM/60ML PO SUSP: 15.0000 g | Freq: Once | ORAL | 0 refills | Status: AC

## 2017-07-21 NOTE — Telephone Encounter (Signed)
Pt was called and informed of lab results and medication being sent to pharmacy.

## 2017-08-02 MED FILL — KIONEX 15 GM/60 ML SUS: 15 | 1 days supply | Qty: 120 | Fill #0

## 2017-08-04 MED FILL — ATORVASTATIN 20 MG TABLET: 20 | 30 days supply | Qty: 30 | Fill #0

## 2017-08-04 MED FILL — $VIAGRA 100 MG TABLET: 100 | 30 days supply | Qty: 10 | Fill #3

## 2017-08-10 ENCOUNTER — Ambulatory Visit (INDEPENDENT_AMBULATORY_CARE_PROVIDER_SITE_OTHER): Payer: Medicaid Other | Admitting: Internal Medicine

## 2017-08-10 ENCOUNTER — Encounter: Payer: Self-pay | Admitting: Internal Medicine

## 2017-08-10 VITALS — BP 160/90 | HR 90 | Wt 214.0 lb

## 2017-08-10 DIAGNOSIS — N529 Male erectile dysfunction, unspecified: Secondary | ICD-10-CM

## 2017-08-10 DIAGNOSIS — E0500 Thyrotoxicosis with diffuse goiter without thyrotoxic crisis or storm: Secondary | ICD-10-CM | POA: Diagnosis not present

## 2017-08-10 LAB — TSH

## 2017-08-10 LAB — T3, FREE: T3, Free: 6.4 pg/mL — ABNORMAL HIGH (ref 2.3–4.2)

## 2017-08-10 LAB — T4, FREE: FREE T4: 2.92 ng/dL — AB (ref 0.60–1.60)

## 2017-08-10 MED ORDER — METOPROLOL SUCCINATE ER 25 MG PO TB24: 25.0000 mg | ORAL_TABLET | Freq: Every day | ORAL | 2 refills | Status: DC

## 2017-08-10 NOTE — Progress Notes (Signed)
Patient ID: Edward [name], male   DOB: [birth year], 46 y.o.   MRN: [id number]   HPI  Edward [name] is a 46 y.o.-year-old male, initially referred by his PCP, Dr. Feliciana Rossetti, returning for f/u for Graves ds. Last visit 3 mo ago.  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  Since last visit, he had a Thyroid Uptake and Scan (05/24/2017): 1. Elevated 24 hour radioactive iodine uptake equal to 36.9%. 2. Heterogeneous tracer activity within both lobes of the thyroid gland suggesting multinodular goiter.  He had RAI Tx (06/23/2017).  I reviewed pt's thyroid tests: Lab Results  Component Value Date   TSH <0.01 (L) 05/12/2017   TSH <0.01 (L) 02/21/2017   TSH 0.04 (L) 09/20/2016   TSH 0.03 (L) 05/31/2016   TSH 0.05 (L) 04/11/2016   TSH 0.07 (L) 02/26/2016   TSH 0.01 (L) 01/08/2016   TSH 0.018 (L) 11/25/2015   TSH <0.008 (L) 05/29/2015   TSH <0.008 (L) 06/21/2013   FREET4 1.69 (H) 05/12/2017   FREET4 1.48 02/21/2017   FREET4 1.50 09/20/2016   FREET4 1.24 05/31/2016   FREET4 1.14 04/11/2016   FREET4 1.97 (H) 02/26/2016   FREET4 2.1 (H) 01/08/2016   FREET4 1.27 05/29/2015    Lab Results  Component Value Date   TSI 463 (H) 02/26/2016  Elevated Graves antibodies + overt thyrotoxicosis >> dx of Graves' disease.   Pt denies: - feeling nodules in neck - hoarseness - dysphagia - choking - SOB with lying down  Pt does not have a FH of thyroid ds. No FH of thyroid cancer. No h/o radiation tx to head or neck.  No seaweed or kelp. No recent contrast studies. No herbal supplements. No Biotin use. No recent steroids use.   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  Lab Results  Component Value Date   HGBA1C 6.1 06/21/2017   He had several eye surgeries - OU DR. 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   ROS: Constitutional: no weight gain/no weight loss, no fatigue, no subjective hyperthermia, no subjective hypothermia Eyes: no blurry vision, no xerophthalmia ENT: no sore throat, + see HPI Cardiovascular: no CP/no SOB/no palpitations/no leg swelling Respiratory: no cough/no SOB/no wheezing Gastrointestinal: no N/no V/no D/no C/no acid reflux Musculoskeletal: no muscle aches/no joint aches Skin: no rashes, no hair loss Neurological: no tremors/no numbness/no tingling/no dizziness + Pbs with erections  I reviewed pt's medications, allergies, PMH, social hx, family hx, and changes were documented in the history of present illness. Otherwise, unchanged from my initial visit note. Since last visit, he was taken off his DM meds.  Past Medical History:  Diagnosis Date  . Anemia   . Arthritis   . Chronic kidney disease   . Diabetes mellitus   . Graves disease 2014  . Headache   . Hypertension   . Non-compliance   . Shortness of breath dyspnea    Past Surgical History:  Procedure Laterality Date  . CIRCUMCISION     as a child, around 78 years of age  . EYE SURGERY    . WISDOM TOOTH EXTRACTION     Social History   Social History  . Marital Status: Married    Spouse Name: N/A  . Number of Children:  5: 25-4 y/o (2017)   Occupational History  . Chef-cook   Social History Main Topics  . Smoking status: Former Smoker -- 0.75 packs/day for 3 years    Types: Cigarettes    Quit date: 2004  . Smokeless tobacco: Not on file  . Alcohol Use:      Comment: 1 beer every night  . Drug Use: No   Current Outpatient Medications on File Prior to Visit  Medication Sig Dispense Refill  . atorvastatin (LIPITOR) 20 MG tablet Take 1 tablet (20 mg total) by mouth daily. 30 tablet 3  . acetaminophen (TYLENOL) 325 MG tablet Take 650 mg by mouth every 6 (six) hours as needed for mild pain.    Marland Kitchen amLODipine (NORVASC) 10 MG tablet Take 1 tablet (10 mg total) by mouth daily.  (Patient not taking: Reported on 07/18/2017) 30 tablet 3   No current facility-administered medications on file prior to visit.    No Known Allergies Family History  Problem Relation Age of Onset  . Hypertension Mother   . Diabetes Mother    PE: BP (!) 160/90 (BP Location: Left Arm, Patient Position: Sitting)   Pulse 90   Wt 214 lb (97.1 kg)   SpO2 97%   BMI 29.02 kg/m   Wt Readings from Last 3 Encounters:  08/10/17 214 lb (97.1 kg)  07/18/17 220 lb 3.2 oz (99.9 kg)  06/21/17 222 lb (100.7 kg)   Constitutional: overweight, in NAD Eyes: PERRLA, EOMI, + exophthalmos, No lid lag or stare ENT: moist mucous membranes, + L thyromegaly, no cervical lymphadenopathy Cardiovascular: tachycardia, RR, No MRG Respiratory: CTA B Gastrointestinal: abdomen soft, NT, ND, BS+ Musculoskeletal: no deformities, strength intact in all 4 Skin: moist, warm, no rashes Neurological: no tremor with outstretched hands, DTR normal in all 4  ASSESSMENT: 1. Graves ds - thyrotoxicosis since at least 2014  2.ED  PLAN:  1. Patient with Berenice Primas' disease for at least 4 years, initially with thyrotoxic symptoms: Weight loss, heat intolerance, fatigue, insomnia, which resolved.  He continues to have generalized pain, unrelated to his thyroid condition.  We diagnosed Graves' disease based on thyrotoxicosis timeline and elevated TSI antibodies.  We started methimazole, but he was noncompliant with the medication, frequently forgetting it and having large periods of time without the medication.  At last visit, we had a long discussion about the consequences of his not taking seriously his Graves' disease.  I underlined the risk for cardiac disease including arrhythmia and sudden death and also increased risk of clotting, including stroke and heart attacks.  I strongly advised him to go ahead with radioactive iodide treatment and he finally had this done 1.5 months ago.  Before his RAI treatment, he was on methimazole  10 mg in a.m., which he stopped after the RAI treatment. - We will check his TFTs today. If labs are still thyrotoxic, as he is tachycardic and with HTN >> may need to start back Toprol XL but at 25 mg daily - We discussed about the possible need to start levothyroxine and how to take it correctly if we end up starting the medication: every day, with water, at least 30 minutes before breakfast, separated by at least 4 hours from: - acid reflux medications - calcium - iron - multivitamins - RTC in 3 mo  2.ED - he c/o not having erections - this pb is chronic, but worse lately - discussed the need to control his BP, now that DM is under control - reviewed  together latest testosterone level checked by PCP >> normal - I suggested a urology referral  Needs to be called with results.   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  Philemon Kingdom, MD PhD [facility]

## 2017-08-10 NOTE — Patient Instructions (Addendum)
Please stop at the lab.  If we need to start Levothyroxine, Take the thyroid hormone every day, with water, at least 30 minutes before breakfast, separated by at least 4 hours from: - acid reflux medications - calcium - iron - multivitamins  Please come back for a follow-up appointment in 3 months.  Come back for las in 5-6 weeks.

## 2017-08-15 ENCOUNTER — Encounter: Payer: Self-pay | Admitting: Pharmacist

## 2017-08-15 ENCOUNTER — Ambulatory Visit: Payer: Medicaid Other | Admitting: Family Medicine

## 2017-08-15 ENCOUNTER — Ambulatory Visit: Payer: Medicaid Other | Attending: Family Medicine | Admitting: Pharmacist

## 2017-08-15 VITALS — BP 146/83 | HR 68

## 2017-08-15 DIAGNOSIS — Z9119 Patient's noncompliance with other medical treatment and regimen: Secondary | ICD-10-CM | POA: Insufficient documentation

## 2017-08-15 DIAGNOSIS — Z0130 Encounter for examination of blood pressure without abnormal findings: Secondary | ICD-10-CM | POA: Diagnosis not present

## 2017-08-15 NOTE — Progress Notes (Signed)
   S:    Patient arrives in good spirits.    Presents to the clinic for hypertension evaluation. Patient was last seen by Primary Care Provider on 07/18/17.   Patient denies adherence with medications.  Current BP Medications include:  Metoprolol succinate 25 mg daily (not taking)  Antihypertensives tried in the past include: amlodipine (stopped due to not wanting to take it)  Patient asked about status of urology referral.  O:   Last 3 Office BP readings: BP Readings from Last 3 Encounters:  08/15/17 (!) 146/83  08/10/17 (!) 160/90  07/18/17 (!) 176/93    BMET    Component Value Date/Time   NA 143 07/18/2017 1421   K 6.1 (H) 07/18/2017 1421   CL 114 (H) 07/18/2017 1421   CO2 18 (L) 07/18/2017 1421   GLUCOSE 103 (H) 07/18/2017 1421   GLUCOSE 101 (H) 12/19/2016 1714   BUN 39 (H) 07/18/2017 1421   CREATININE 2.75 (H) 07/18/2017 1421   CREATININE 2.27 (H) 12/19/2016 1714   CALCIUM 9.1 07/18/2017 1421   GFRNONAA 26 (L) 07/18/2017 1421   GFRNONAA 34 (L) 12/19/2016 1714   GFRAA 31 (L) 07/18/2017 1421   GFRAA 39 (L) 12/19/2016 1714    A/P: Hypertension longstanding currently uncontrolled on current medications due to noncompliance.  Continued current medications and encouraged patient to take his medications as prescribed. Patient to follow up with PCP in 2 weeks.   Urology referral pending. Patient will receive call with appointment information once processed.  Results reviewed and written information provided.   Total time in face-to-face counseling 10 minutes.  Patient seen with Thornton Park, PharmD Candidate

## 2017-08-15 NOTE — Patient Instructions (Addendum)
Thanks for coming to see [place]  Take your medications as prescribed  Follow up with Dr. Jarold Song in 2 weeks

## 2017-08-30 ENCOUNTER — Ambulatory Visit: Payer: Medicaid Other | Admitting: Family Medicine

## 2017-08-31 ENCOUNTER — Encounter: Payer: Self-pay | Admitting: Family Medicine

## 2017-08-31 ENCOUNTER — Ambulatory Visit: Payer: Medicaid Other | Attending: Family Medicine | Admitting: Family Medicine

## 2017-08-31 VITALS — BP 167/89 | HR 83 | Temp 98.1°F | Ht 72.0 in | Wt 211.4 lb

## 2017-08-31 DIAGNOSIS — E1165 Type 2 diabetes mellitus with hyperglycemia: Secondary | ICD-10-CM | POA: Diagnosis not present

## 2017-08-31 DIAGNOSIS — Z9119 Patient's noncompliance with other medical treatment and regimen: Secondary | ICD-10-CM | POA: Diagnosis not present

## 2017-08-31 DIAGNOSIS — I129 Hypertensive chronic kidney disease with stage 1 through stage 4 chronic kidney disease, or unspecified chronic kidney disease: Secondary | ICD-10-CM | POA: Diagnosis not present

## 2017-08-31 DIAGNOSIS — Z9889 Other specified postprocedural states: Secondary | ICD-10-CM | POA: Diagnosis not present

## 2017-08-31 DIAGNOSIS — Z91199 Patient's noncompliance with other medical treatment and regimen due to unspecified reason: Secondary | ICD-10-CM

## 2017-08-31 DIAGNOSIS — N529 Male erectile dysfunction, unspecified: Secondary | ICD-10-CM | POA: Insufficient documentation

## 2017-08-31 DIAGNOSIS — Z79899 Other long term (current) drug therapy: Secondary | ICD-10-CM | POA: Insufficient documentation

## 2017-08-31 DIAGNOSIS — E118 Type 2 diabetes mellitus with unspecified complications: Secondary | ICD-10-CM

## 2017-08-31 DIAGNOSIS — E0500 Thyrotoxicosis with diffuse goiter without thyrotoxic crisis or storm: Secondary | ICD-10-CM | POA: Diagnosis not present

## 2017-08-31 DIAGNOSIS — M1990 Unspecified osteoarthritis, unspecified site: Secondary | ICD-10-CM | POA: Insufficient documentation

## 2017-08-31 DIAGNOSIS — IMO0002 Reserved for concepts with insufficient information to code with codable children: Secondary | ICD-10-CM

## 2017-08-31 DIAGNOSIS — I10 Essential (primary) hypertension: Secondary | ICD-10-CM

## 2017-08-31 DIAGNOSIS — N183 Chronic kidney disease, stage 3 unspecified: Secondary | ICD-10-CM

## 2017-08-31 DIAGNOSIS — Z9114 Patient's other noncompliance with medication regimen: Secondary | ICD-10-CM | POA: Insufficient documentation

## 2017-08-31 DIAGNOSIS — E1122 Type 2 diabetes mellitus with diabetic chronic kidney disease: Secondary | ICD-10-CM | POA: Insufficient documentation

## 2017-08-31 DIAGNOSIS — E875 Hyperkalemia: Secondary | ICD-10-CM | POA: Diagnosis not present

## 2017-08-31 LAB — POCT GLYCOSYLATED HEMOGLOBIN (HGB A1C): Hemoglobin A1C: 5.5

## 2017-08-31 LAB — GLUCOSE, POCT (MANUAL RESULT ENTRY): POC Glucose: 157 mg/dl — AB (ref 70–99)

## 2017-08-31 NOTE — Progress Notes (Signed)
 Subjective:  Patient ID: Edward [name], male    DOB: 05/19/1971  Age: 46 y.o. MRN: [id number]  CC: Diabetes   HPI Edward [name] is a 46-year-old male with a history of hypertension, type 2 diabetes mellitus (A1c 5.5), Graves' disease (status post radioactive iodine treatments), stage III chronic kidney disease, erectile dysfunction who presents today for a follow-up visit.  His blood pressure is elevated and he endorses not taking his antihypertensive.  He admits to not taking any medications and does not give a reason as to why. He has not been adherent with a low-sodium diet or exercise.  His diabetes is diet controlled and his A1c has improved from 6.1 down to 5.5.  3 weeks ago he had a visit to endocrine-Dr.Gherge for management of his Graves' disease and noncompliance was also an issue; last TSH was suppressed at 0.01.  He is yet to be seen for management of his chronic kidney disease but was referred 2 months ago.  Labs have been significant for hyperkalemia with the last of which was 6.1 for which he received Kayexalate. He is also awaiting an appointment with urology for management of erectile dysfunction.  He has no other acute concerns today.   Past Medical History:  Diagnosis Date  . Anemia   . Arthritis   . Chronic kidney disease   . Diabetes mellitus   . Graves disease 2014  . Headache   . Hypertension   . Non-compliance   . Shortness of breath dyspnea     Past Surgical History:  Procedure Laterality Date  . CIRCUMCISION     as a child, around 5 years of age  . EYE SURGERY    . PARS PLANA VITRECTOMY Right 03/25/2016   Procedure: PARS PLANA VITRECTOMY 25 GAUGE FOR ENDOPHTHALMITIS;  Surgeon: Narendra Patel, MD;  Location: [facility] OR;  Service: Ophthalmology;  Laterality: Right;  . WISDOM TOOTH EXTRACTION      No Known Allergies   Outpatient Medications Prior to Visit  Medication Sig Dispense Refill  . acetaminophen (TYLENOL) 325 MG tablet Take  650 mg by mouth every 6 (six) hours as needed for mild pain.    . amLODipine (NORVASC) 10 MG tablet Take 1 tablet (10 mg total) by mouth daily. (Patient not taking: Reported on 07/18/2017) 30 tablet 3  . atorvastatin (LIPITOR) 20 MG tablet Take 1 tablet (20 mg total) by mouth daily. (Patient not taking: Reported on 08/15/2017) 30 tablet 3  . metoprolol succinate (TOPROL-XL) 25 MG 24 hr tablet Take 1 tablet (25 mg total) daily by mouth. (Patient not taking: Reported on 08/15/2017) 45 tablet 2   No facility-administered medications prior to visit.     ROS Review of Systems General: negative for fever, weight loss, appetite change Eyes: no visual symptoms. ENT: no ear symptoms, no sinus tenderness, no nasal congestion or sore throat. Neck: no pain  Respiratory: no wheezing, shortness of breath, cough Cardiovascular: no chest pain, no dyspnea on exertion, no pedal edema, no orthopnea. Gastrointestinal: no abdominal pain, no diarrhea, no constipation Genito-Urinary: no urinary frequency, no dysuria, no polyuria. Hematologic: no bruising Endocrine: no cold or heat intolerance Neurological: no headaches, no seizures, no tremors Musculoskeletal: no joint pains, no joint swelling Skin: no pruritus, no rash. Psychological: no depression, no anxiety,    Objective:  BP (!) 167/89   Pulse 83   Temp 98.1 F (36.7 C) (Oral)   Ht 6' (1.829 m)   Wt 211 lb 6.4 oz (95.9 kg)     SpO2 100%   BMI 28.67 kg/m   BP/Weight 08/31/2017 08/15/2017 08/10/2017  Systolic BP 167 146 160  Diastolic BP 89 83 90  Wt. (Lbs) 211.4 - 214  BMI 28.67 - 29.02      Physical Exam Constitutional: normal appearing,  Eyes: PERRLA HEENT: Head is atraumatic, normal sinuses, normal oropharynx, normal appearing tonsils and palate, tympanic membrane is normal bilaterally. Neck: normal range of motion, no thyromegaly, no JVD Cardiovascular: normal rate and rhythm, normal heart sounds, no murmurs, rub or gallop, no pedal  edema Respiratory: clear to auscultation bilaterally, no wheezes, no rales, no rhonchi Abdomen: soft, not tender to palpation, normal bowel sounds, no enlarged organs Extremities: Full ROM, no tenderness in joints Skin: warm and dry, no lesions. Neurological: alert, oriented x3, cranial nerves I-XII grossly intact , normal motor strength, normal sensation. Psychological: normal mood.    CMP Latest Ref Rng & Units 07/18/2017 06/21/2017 12/19/2016  Glucose 65 - 99 mg/dL 103(H) 200(H) 101(H)  BUN 6 - 24 mg/dL 39(H) 49(H) 36(H)  Creatinine 0.76 - 1.27 mg/dL 2.75(H) 2.79(H) 2.27(H)  Sodium 134 - 144 mmol/L 143 136 141  Potassium 3.5 - 5.2 mmol/L 6.1(H) 6.2(H) 5.0  Chloride 96 - 106 mmol/L 114(H) 108(H) 112(H)  CO2 20 - 29 mmol/L 18(L) 15(L) 20  Calcium 8.7 - 10.2 mg/dL 9.1 8.3(L) 8.8  Total Protein 6.0 - 8.5 g/dL 7.1 6.4 -  Total Bilirubin 0.0 - 1.2 mg/dL 0.3 <0.2 -  Alkaline Phos 39 - 117 IU/L 134(H) 119(H) -  AST 0 - 40 IU/L 29 28 -  ALT 0 - 44 IU/L 33 41 -    Lab Results  Component Value Date   HGBA1C 5.5 08/31/2017    Assessment & Plan:   1. Diabetes mellitus type 2, uncontrolled, with complications (HCC) With A1c of 8.5 Diet controlled Diabetic diet - POCT glucose (manual entry) - POCT glycosylated hemoglobin (Hb A1C)  2. Non compliance with medical treatment Strongly emphasized the need to be compliant Consequences of noncompliance have been discussed and he promises to do better  3. Stage 3 chronic kidney disease (HCC) Density versus diabetic nephropathy Last creatinine was 2.75 He is at risk of worsening due to noncompliance Referred to nephrology previously We will send message to referral coordinator to follow-up on this. Avoid nephrotoxins  4. Uncontrolled hypertension Secondary to noncompliance Advised to comply with antihypertensives Low sodium, DASH diet, lifestyle modifications  5. Hyperkalemia Likely from CKD Last potassium was 6.1 which was treated  with Kayexalate We will repeat potassium level today - CMP14+EGFR  6.  Graves' disease As per endocrine  No orders of the defined types were placed in this encounter.   Follow-up: Return in about 3 months (around 11/30/2017) for follow up on hypertension and diabetes.    Amao MD   

## 2017-08-31 NOTE — Patient Instructions (Signed)

## 2017-09-01 LAB — CMP14+EGFR
A/G RATIO: 1.2 (ref 1.2–2.2)
ALBUMIN: 3.5 g/dL (ref 3.5–5.5)
ALK PHOS: 143 IU/L — AB (ref 39–117)
ALT: 25 IU/L (ref 0–44)
AST: 21 IU/L (ref 0–40)
BILIRUBIN TOTAL: 0.2 mg/dL (ref 0.0–1.2)
BUN/Creatinine Ratio: 14 (ref 9–20)
BUN: 40 mg/dL — ABNORMAL HIGH (ref 6–24)
CHLORIDE: 112 mmol/L — AB (ref 96–106)
CO2: 19 mmol/L — ABNORMAL LOW (ref 20–29)
CREATININE: 2.8 mg/dL — AB (ref 0.76–1.27)
Calcium: 8.9 mg/dL (ref 8.7–10.2)
GFR calc Af Amer: 30 mL/min/{1.73_m2} — ABNORMAL LOW (ref >59)
GFR calc non Af Amer: 26 mL/min/{1.73_m2} — ABNORMAL LOW (ref >59)
GLOBULIN, TOTAL: 2.9 g/dL (ref 1.5–4.5)
Glucose: 128 mg/dL — ABNORMAL HIGH (ref 65–99)
POTASSIUM: 5.4 mmol/L — AB (ref 3.5–5.2)
SODIUM: 144 mmol/L (ref 134–144)
Total Protein: 6.4 g/dL (ref 6.0–8.5)

## 2017-09-21 ENCOUNTER — Other Ambulatory Visit: Payer: Medicaid Other

## 2017-10-04 MED FILL — $VIAGRA 100 MG TABLET: 100 | 30 days supply | Qty: 10 | Fill #4

## 2017-10-26 ENCOUNTER — Other Ambulatory Visit: Payer: Self-pay | Admitting: Nephrology

## 2017-10-26 DIAGNOSIS — N183 Chronic kidney disease, stage 3 unspecified: Secondary | ICD-10-CM

## 2017-11-05 ENCOUNTER — Emergency Department (HOSPITAL_COMMUNITY): Payer: Self-pay

## 2017-11-05 ENCOUNTER — Emergency Department (HOSPITAL_COMMUNITY)
Admission: EM | Admit: 2017-11-05 | Discharge: 2017-11-05 | Disposition: A | Discharge status: Home / Self Care | Payer: Self-pay | Attending: Emergency Medicine | Admitting: Emergency Medicine

## 2017-11-05 ENCOUNTER — Encounter (HOSPITAL_COMMUNITY): Payer: Self-pay

## 2017-11-05 DIAGNOSIS — M75101 Unspecified rotator cuff tear or rupture of right shoulder, not specified as traumatic: Secondary | ICD-10-CM | POA: Insufficient documentation

## 2017-11-05 DIAGNOSIS — M7062 Trochanteric bursitis, left hip: Secondary | ICD-10-CM | POA: Insufficient documentation

## 2017-11-05 DIAGNOSIS — N289 Disorder of kidney and ureter, unspecified: Secondary | ICD-10-CM

## 2017-11-05 DIAGNOSIS — Z87891 Personal history of nicotine dependence: Secondary | ICD-10-CM | POA: Insufficient documentation

## 2017-11-05 DIAGNOSIS — R091 Pleurisy: Secondary | ICD-10-CM | POA: Insufficient documentation

## 2017-11-05 DIAGNOSIS — I129 Hypertensive chronic kidney disease with stage 1 through stage 4 chronic kidney disease, or unspecified chronic kidney disease: Secondary | ICD-10-CM | POA: Insufficient documentation

## 2017-11-05 DIAGNOSIS — N189 Chronic kidney disease, unspecified: Secondary | ICD-10-CM | POA: Insufficient documentation

## 2017-11-05 DIAGNOSIS — M7581 Other shoulder lesions, right shoulder: Secondary | ICD-10-CM

## 2017-11-05 DIAGNOSIS — E119 Type 2 diabetes mellitus without complications: Secondary | ICD-10-CM | POA: Insufficient documentation

## 2017-11-05 DIAGNOSIS — Z79899 Other long term (current) drug therapy: Secondary | ICD-10-CM | POA: Insufficient documentation

## 2017-11-05 DIAGNOSIS — Y939 Activity, unspecified: Secondary | ICD-10-CM | POA: Insufficient documentation

## 2017-11-05 LAB — BASIC METABOLIC PANEL
Anion gap: 9 (ref 5–15)
BUN: 40 mg/dL — AB (ref 6–20)
CO2: 20 mmol/L — ABNORMAL LOW (ref 22–32)
Calcium: 8.8 mg/dL — ABNORMAL LOW (ref 8.9–10.3)
Chloride: 111 mmol/L (ref 101–111)
Creatinine, Ser: 3.14 mg/dL — ABNORMAL HIGH (ref 0.61–1.24)
GFR calc Af Amer: 26 mL/min — ABNORMAL LOW (ref >60)
GFR, EST NON AFRICAN AMERICAN: 22 mL/min — AB (ref >60)
Glucose, Bld: 124 mg/dL — ABNORMAL HIGH (ref 65–99)
Potassium: 4.7 mmol/L (ref 3.5–5.1)
Sodium: 140 mmol/L (ref 135–145)

## 2017-11-05 LAB — CBC
HEMATOCRIT: 30.1 % — AB (ref 39.0–52.0)
HEMOGLOBIN: 10.4 g/dL — AB (ref 13.0–17.0)
MCH: 29.7 pg (ref 26.0–34.0)
MCHC: 34.6 g/dL (ref 30.0–36.0)
MCV: 86 fL (ref 78.0–100.0)
Platelets: 221 10*3/uL (ref 150–400)
RBC: 3.5 MIL/uL — AB (ref 4.22–5.81)
RDW: 13.8 % (ref 11.5–15.5)
WBC: 5.4 10*3/uL (ref 4.0–10.5)

## 2017-11-05 LAB — I-STAT TROPONIN, ED: Troponin i, poc: 0.02 ng/mL (ref 0.00–0.08)

## 2017-11-05 MED ORDER — METHYLPREDNISOLONE 4 MG PO TBPK: ORAL_TABLET | ORAL | 0 refills | Status: DC

## 2017-11-05 MED ORDER — TRAMADOL HCL 50 MG PO TABS: 50.0000 mg | ORAL_TABLET | Freq: Four times a day (QID) | ORAL | 0 refills | Status: DC | PRN

## 2017-11-05 NOTE — ED Provider Notes (Signed)
[place] EMERGENCY DEPARTMENT Provider Note   CSN: [id number] Arrival date & time: 11/05/17  8502     History   Chief Complaint No chief complaint on file.   HPI Edward [name] is a 47 y.o. male.  Chief complaint is chest pain, shoulder pain, hip pain.  HPI 47 year old male.  History of hypertension, non-insulin-dependent/diet-controlled diabetes, medicine noncompliance, and progressive nephropathy if present with multiple complaints including right shoulder, left hip, and left anterior chest sharp pain.  He does not like to speak about his renal disease.  States he is well aware of it.  States he has a follow-up scheduled with his nephrologist.  States he has been told that he has "type III disease".  Cough a few weeks ago.  Is not had fever.  States that he has some pain in his right shoulder occasionally cannot even lift it overhead.  No injury to his hip but states he has pain and walks with a limp.  Past Medical History:  Diagnosis Date  . Anemia   . Arthritis   . Chronic kidney disease   . Diabetes mellitus   . Graves disease 2014  . Headache   . Hypertension   . Non-compliance   . Shortness of breath dyspnea     Patient Active Problem List   Diagnosis Date Noted  . ED (erectile dysfunction) 08/10/2017  . Chronic kidney disease 07/18/2017  . Arthralgia 07/18/2017  . Arthritis 07/18/2017  . Graves disease 03/02/2016  . Non compliance with medical treatment 11/26/2015  . Uncontrolled hypertension 11/26/2015  . Acute kidney injury superimposed on chronic kidney disease ([place]) 11/25/2015  . Chest pain at rest 11/25/2015  . Hyperkalemia 11/25/2015  . Diabetes mellitus type 2, uncontrolled, with complications ([place]) 77/41/2878    Past Surgical History:  Procedure Laterality Date  . CIRCUMCISION     as a child, around 73 years of age  . EYE SURGERY    . PARS PLANA VITRECTOMY Right 03/25/2016   Procedure: PARS PLANA VITRECTOMY 25 GAUGE FOR  ENDOPHTHALMITIS;  Surgeon: Jalene Mullet, MD;  Location: [facility];  Service: Ophthalmology;  Laterality: Right;  . WISDOM TOOTH EXTRACTION         Home Medications    Prior to Admission medications   Medication Sig Start Date End Date Taking? Authorizing Provider  acetaminophen (TYLENOL) 325 MG tablet Take 650 mg by mouth every 6 (six) hours as needed for mild pain.   Yes [provider]  amLODipine (NORVASC) 10 MG tablet Take 1 tablet (10 mg total) by mouth daily. 12/19/16  Yes Langeland, Dawn T, MD  atorvastatin (LIPITOR) 20 MG tablet Take 1 tablet (20 mg total) by mouth daily. 07/18/17  Yes [name], MD  metoprolol succinate (TOPROL-XL) 25 MG 24 hr tablet Take 1 tablet (25 mg total) daily by mouth. 08/10/17  Yes Philemon Kingdom, MD  methylPREDNISolone (MEDROL DOSEPAK) 4 MG TBPK tablet 6 po on day 1, decrease by 1 tab per day 11/05/17   Tanna Furry, MD  traMADol (ULTRAM) 50 MG tablet Take 1 tablet (50 mg total) by mouth every 6 (six) hours as needed. 11/05/17   Tanna Furry, MD    Family History Family History  Problem Relation Age of Onset  . Hypertension Mother   . Diabetes Mother     Social History Social History   Tobacco Use  . Smoking status: Former Smoker    Packs/day: 0.75    Years: 3.00    Pack years:  2.25    Types: Cigarettes    Last attempt to quit: 05/28/2000    Years since quitting: 17.4  . Smokeless tobacco: Never Used  Substance Use Topics  . Alcohol use: Yes    Alcohol/week: 0.0 oz    Comment: 1 beer every night  . Drug use: No     Allergies   Patient has no known allergies.   Review of Systems Review of Systems  Constitutional: Negative for appetite change, chills, diaphoresis, fatigue and fever.  HENT: Negative for mouth sores, sore throat and trouble swallowing.   Eyes: Negative for visual disturbance.  Respiratory: Negative for cough, chest tightness, shortness of breath and wheezing.        Sharp intermittent left anterior chest  pain.  Worse with breathing and cough.  No dyspnea.  No fever.  Cardiovascular: Negative for chest pain.  Gastrointestinal: Negative for abdominal distention, abdominal pain, diarrhea, nausea and vomiting.  Endocrine: Negative for polydipsia, polyphagia and polyuria.  Genitourinary: Negative for dysuria, frequency and hematuria.  Musculoskeletal: Positive for arthralgias. Negative for gait problem.       Right shoulder left hip pain.  Skin: Negative for color change, pallor and rash.  Neurological: Negative for dizziness, syncope, light-headedness and headaches.  Hematological: Does not bruise/bleed easily.  Psychiatric/Behavioral: Negative for behavioral problems and confusion.     Physical Exam Updated Vital Signs BP (!) 173/103 (BP Location: Right Arm)   Pulse 72   Temp 98.5 F (36.9 C) (Oral)   Resp 18   Ht 6' (1.829 m)   Wt 95.7 kg (211 lb)   SpO2 100%   BMI 28.62 kg/m   Physical Exam  Constitutional: He is oriented to person, place, and time. He appears well-developed and well-nourished. No distress.  HENT:  Head: Normocephalic.  Eyes: Conjunctivae are normal. Pupils are equal, round, and reactive to light. No scleral icterus.  Neck: Normal range of motion. Neck supple. No thyromegaly present.  Cardiovascular: Normal rate and regular rhythm. Exam reveals no gallop and no friction rub.  No murmur heard. Pulmonary/Chest: Effort normal and breath sounds normal. No respiratory distress. He has no wheezes. He has no rales.  Clear bilateral breath sounds.  No wheezing rales or rhonchi.  No pleural or pericardial friction rubs.  Abdominal: Soft. Bowel sounds are normal. He exhibits no distension. There is no tenderness. There is no rebound.  Musculoskeletal: Normal range of motion.  Pain in the right anterior shoulder.  Pain is reproduced with biceps flexion with any rotation of the arm.  Tenderness is along the supraspinatus fossa consistent with rotator cuff tendinopathy.   Also tender over the left trochanteric bursa.  No pain over the pubic symphysis.  No pain in the sciatic notch.  No midline low back pain.  No neurological findings to the lower extremities  Neurological: He is alert and oriented to person, place, and time.  Skin: Skin is warm and dry. No rash noted.  Psychiatric: He has a normal mood and affect. His behavior is normal.     ED Treatments / Results  Labs (all labs ordered are listed, but only abnormal results are displayed) Labs Reviewed  BASIC METABOLIC PANEL - Abnormal; Notable for the following components:      Result Value   CO2 20 (*)    Glucose, Bld 124 (*)    BUN 40 (*)    Creatinine, Ser 3.14 (*)    Calcium 8.8 (*)    GFR calc non Af Amer 22 (*)  GFR calc Af Amer 26 (*)    All other components within normal limits  CBC - Abnormal; Notable for the following components:   RBC 3.50 (*)    Hemoglobin 10.4 (*)    HCT 30.1 (*)    All other components within normal limits  I-STAT TROPONIN, ED    EKG  EKG Interpretation None       Radiology Dg Chest 2 View  Result Date: 11/05/2017 CLINICAL DATA:  Chest pain x4 days EXAM: CHEST  2 VIEW COMPARISON:  11/25/2015 FINDINGS: Lungs are clear.  No pleural effusion or pneumothorax. The heart is normal in size. Visualized osseous structures are within normal limits. IMPRESSION: Normal chest radiographs. Electronically Signed   By: Julian Hy M.D.   On: 11/05/2017 08:06   Dg Hip Unilat W Or Wo Pelvis 2-3 Views Left  Result Date: 11/05/2017 CLINICAL DATA:  Left hip pain. EXAM: DG HIP (WITH OR WITHOUT PELVIS) 2-3V LEFT COMPARISON:  None. FINDINGS: There is no evidence of hip fracture or dislocation. The hip joints are preserved bilaterally. No evidence of pelvic fractures. Enthesophytes off of the left lesser trochanter. IMPRESSION: No evidence of fracture, dislocation or significant arthropathy of the left hip. Electronically Signed   By: Fidela Salisbury M.D.   On: 11/05/2017  09:11    Procedures Procedures (including critical care time)  Medications Ordered in ED Medications - No data to display   Initial Impression / Assessment and Plan / ED Course  I have reviewed the triage vital signs and the nursing notes.  Pertinent labs & imaging results that were available during my care of the patient were reviewed by me and considered in my medical decision making (see chart for details).    X-ray of the left hip shows no arthritic changes or gross abnormalities.  No avascular necrosis signs.  Chest x-ray is normal.  EKG is reassuring.  Discussion he cannot take anti-inflammatories.  I discussed with him at length and he is clear that he is avoiding these.  I have reiterated to him that he has progressive renal disease and he does acknowledge S although he does not like to speak with.  Plan Ultram, prednisone, primary care follow-up.  Final Clinical Impressions(s) / ED Diagnoses   Final diagnoses:  Renal insufficiency  Tendinitis of right rotator cuff  Trochanteric bursitis of left hip  Pleurisy    ED Discharge Orders        Ordered    methylPREDNISolone (MEDROL DOSEPAK) 4 MG TBPK tablet     11/05/17 1000    traMADol (ULTRAM) 50 MG tablet  Every 6 hours PRN     11/05/17 1000       Tanna Furry, MD 11/05/17 1004

## 2017-11-05 NOTE — ED Triage Notes (Signed)
Patient complains of sharp chest pain and worse with inspiration x 4-5 days. Also complains of chronic left hip pain. States he stopped taking his BP meds months ago. Has had cold symptoms

## 2017-11-05 NOTE — Discharge Instructions (Signed)
Your renal disease is progressing.  It is imperative that you follow-up with your nephrologist and stay compliant with your blood pressure treatment. Prednisone, steroid should help with the pleurisy, shoulder pain, and hip pain.

## 2017-11-10 ENCOUNTER — Ambulatory Visit: Payer: Medicaid Other | Admitting: Internal Medicine

## 2017-11-10 DIAGNOSIS — Z0289 Encounter for other administrative examinations: Secondary | ICD-10-CM

## 2017-12-01 ENCOUNTER — Encounter: Payer: Self-pay | Admitting: Family Medicine

## 2017-12-01 ENCOUNTER — Ambulatory Visit: Payer: Self-pay | Attending: Family Medicine | Admitting: Family Medicine

## 2017-12-01 VITALS — BP 167/80 | HR 71 | Temp 98.2°F | Ht 72.0 in | Wt 213.0 lb

## 2017-12-01 DIAGNOSIS — E1165 Type 2 diabetes mellitus with hyperglycemia: Secondary | ICD-10-CM

## 2017-12-01 DIAGNOSIS — E1149 Type 2 diabetes mellitus with other diabetic neurological complication: Secondary | ICD-10-CM

## 2017-12-01 DIAGNOSIS — M1612 Unilateral primary osteoarthritis, left hip: Secondary | ICD-10-CM | POA: Insufficient documentation

## 2017-12-01 DIAGNOSIS — E118 Type 2 diabetes mellitus with unspecified complications: Secondary | ICD-10-CM

## 2017-12-01 DIAGNOSIS — Z91199 Patient's noncompliance with other medical treatment and regimen due to unspecified reason: Secondary | ICD-10-CM

## 2017-12-01 DIAGNOSIS — Z79899 Other long term (current) drug therapy: Secondary | ICD-10-CM | POA: Insufficient documentation

## 2017-12-01 DIAGNOSIS — N183 Chronic kidney disease, stage 3 unspecified: Secondary | ICD-10-CM

## 2017-12-01 DIAGNOSIS — I129 Hypertensive chronic kidney disease with stage 1 through stage 4 chronic kidney disease, or unspecified chronic kidney disease: Secondary | ICD-10-CM | POA: Insufficient documentation

## 2017-12-01 DIAGNOSIS — Z9114 Patient's other noncompliance with medication regimen: Secondary | ICD-10-CM | POA: Insufficient documentation

## 2017-12-01 DIAGNOSIS — E1121 Type 2 diabetes mellitus with diabetic nephropathy: Secondary | ICD-10-CM | POA: Insufficient documentation

## 2017-12-01 DIAGNOSIS — I10 Essential (primary) hypertension: Secondary | ICD-10-CM

## 2017-12-01 DIAGNOSIS — N529 Male erectile dysfunction, unspecified: Secondary | ICD-10-CM | POA: Insufficient documentation

## 2017-12-01 DIAGNOSIS — Z9119 Patient's noncompliance with other medical treatment and regimen: Secondary | ICD-10-CM | POA: Insufficient documentation

## 2017-12-01 DIAGNOSIS — E1122 Type 2 diabetes mellitus with diabetic chronic kidney disease: Secondary | ICD-10-CM | POA: Insufficient documentation

## 2017-12-01 DIAGNOSIS — E0500 Thyrotoxicosis with diffuse goiter without thyrotoxic crisis or storm: Secondary | ICD-10-CM | POA: Insufficient documentation

## 2017-12-01 DIAGNOSIS — M1990 Unspecified osteoarthritis, unspecified site: Secondary | ICD-10-CM

## 2017-12-01 DIAGNOSIS — E1140 Type 2 diabetes mellitus with diabetic neuropathy, unspecified: Secondary | ICD-10-CM | POA: Insufficient documentation

## 2017-12-01 LAB — GLUCOSE, POCT (MANUAL RESULT ENTRY): POC Glucose: 132 mg/dl — AB (ref 70–99)

## 2017-12-01 LAB — POCT GLYCOSYLATED HEMOGLOBIN (HGB A1C): Hemoglobin A1C: 5.6

## 2017-12-01 MED ORDER — METOPROLOL SUCCINATE ER 25 MG PO TB24: 25.0000 mg | ORAL_TABLET | Freq: Every day | ORAL | 3 refills | Status: DC

## 2017-12-01 MED ORDER — DICLOFENAC SODIUM 1 % TD GEL: 4.0000 g | Freq: Four times a day (QID) | TRANSDERMAL | 3 refills | Status: DC

## 2017-12-01 MED ORDER — AMLODIPINE BESYLATE 10 MG PO TABS: 10.0000 mg | ORAL_TABLET | Freq: Every day | ORAL | 3 refills | Status: DC

## 2017-12-01 MED ORDER — ATORVASTATIN CALCIUM 20 MG PO TABS: 20.0000 mg | ORAL_TABLET | Freq: Every day | ORAL | 3 refills | Status: DC

## 2017-12-01 NOTE — Progress Notes (Signed)
Subjective:  Patient ID: Edward [name], male    DOB: 01-Feb-1971  Age: 47 y.o. MRN: [id number]  CC: Diabetes and Hypertension   HPI Edward [name]  is a 47 year old male with a history of hypertension, type 2 diabetes mellitus (A1c 5.6), Graves' disease (status post radioactive iodine treatment in 07/2017), stage III chronic kidney disease, erectile dysfunction who presents today for a follow-up visit.  He has been noncompliant with all his medications hence elevated blood pressure.  He informs me today that his medications "make him feel down" and he does not like to take medications. He remains on dietary control for management of diabetes and denies visual concerns but does have diabetic neuropathy in his feet and declines the use of medications at this time.  With regards to his chronic kidney disease he was seen by [facility] and as per patient was informed renal function is worsening and he would need to obtain medical insurance so he can be placed on the transplant list and this saddens him.  He was seen at the ED last month for left hip pain and hip x-ray revealed anterior enthesophytes of the left lesser trochanter.  He was discharged on Medrol Dosepak and tramadol which he never obtained from the pharmacy due to not wanting to take medications. Pain is described as an 8/10 and affects his gait. He has not been to see his endocrinologist- Dr Renne Crigler recently for management of his Berenice Primas' disease.  Past Medical History:  Diagnosis Date  . Anemia   . Arthritis   . Chronic kidney disease   . Diabetes mellitus   . Graves disease 2014  . Headache   . Hypertension   . Non-compliance   . Shortness of breath dyspnea     Past Surgical History:  Procedure Laterality Date  . CIRCUMCISION     as a child, around 51 years of age  . EYE SURGERY    . PARS PLANA VITRECTOMY Right 03/25/2016   Procedure: PARS PLANA VITRECTOMY 25 GAUGE FOR ENDOPHTHALMITIS;   Surgeon: Jalene Mullet, MD;  Location: [facility];  Service: Ophthalmology;  Laterality: Right;  . WISDOM TOOTH EXTRACTION      No Known Allergies   Outpatient Medications Prior to Visit  Medication Sig Dispense Refill  . acetaminophen (TYLENOL) 325 MG tablet Take 650 mg by mouth every 6 (six) hours as needed for mild pain.    Marland Kitchen amLODipine (NORVASC) 10 MG tablet Take 1 tablet (10 mg total) by mouth daily. (Patient not taking: Reported on 12/01/2017) 30 tablet 3  . atorvastatin (LIPITOR) 20 MG tablet Take 1 tablet (20 mg total) by mouth daily. (Patient not taking: Reported on 12/01/2017) 30 tablet 3  . methylPREDNISolone (MEDROL DOSEPAK) 4 MG TBPK tablet 6 po on day 1, decrease by 1 tab per day (Patient not taking: Reported on 12/01/2017) 21 tablet 0  . metoprolol succinate (TOPROL-XL) 25 MG 24 hr tablet Take 1 tablet (25 mg total) daily by mouth. (Patient not taking: Reported on 12/01/2017) 45 tablet 2  . traMADol (ULTRAM) 50 MG tablet Take 1 tablet (50 mg total) by mouth every 6 (six) hours as needed. (Patient not taking: Reported on 12/01/2017) 15 tablet 0   No facility-administered medications prior to visit.     ROS Review of Systems  Constitutional: Negative for activity change and appetite change.  HENT: Negative for sinus pressure and sore throat.   Eyes: Negative for visual disturbance.  Respiratory: Negative for cough, chest tightness  and shortness of breath.   Cardiovascular: Negative for chest pain and leg swelling.  Gastrointestinal: Negative for abdominal distention, abdominal pain, constipation and diarrhea.  Endocrine: Negative.   Genitourinary: Negative for dysuria.  Musculoskeletal:       See hpi  Skin: Negative for rash.  Allergic/Immunologic: Negative.   Neurological: Negative for weakness, light-headedness and numbness.  Psychiatric/Behavioral: Negative for dysphoric mood and suicidal ideas.    Objective:  BP (!) 167/80   Pulse 71   Temp 98.2 F (36.8 C) (Oral)   Ht 6'  (1.829 m)   Wt 213 lb (96.6 kg)   SpO2 100%   BMI 28.89 kg/m   BP/Weight 12/01/2017 11/05/2017 60/07/9322  Systolic BP 557 322 025  Diastolic BP 80 427 89  Wt. (Lbs) 213 211 211.4  BMI 28.89 28.62 28.67      Physical Exam  Constitutional: He is oriented to person, place, and time. He appears well-developed and well-nourished.  Neck: Thyromegaly (Left) present.  Cardiovascular: Normal rate, normal heart sounds and intact distal pulses.  No murmur heard. Pulmonary/Chest: Effort normal and breath sounds normal. He has no wheezes. He has no rales. He exhibits no tenderness.  Abdominal: Soft. Bowel sounds are normal. He exhibits no distension and no mass. There is no tenderness.  Musculoskeletal: He exhibits tenderness (TTP and on ROM of L hip).  Neurological: He is alert and oriented to person, place, and time.  Skin: Skin is warm and dry.  Psychiatric: He has a normal mood and affect.     CMP Latest Ref Rng & Units 11/05/2017 08/31/2017 07/18/2017  Glucose 65 - 99 mg/dL 124(H) 128(H) 103(H)  BUN 6 - 20 mg/dL 40(H) 40(H) 39(H)  Creatinine 0.61 - 1.24 mg/dL 3.14(H) 2.80(H) 2.75(H)  Sodium 135 - 145 mmol/L 140 144 143  Potassium 3.5 - 5.1 mmol/L 4.7 5.4(H) 6.1(H)  Chloride 101 - 111 mmol/L 111 112(H) 114(H)  CO2 22 - 32 mmol/L 20(L) 19(L) 18(L)  Calcium 8.9 - 10.3 mg/dL 8.8(L) 8.9 9.1  Total Protein 6.0 - 8.5 g/dL - 6.4 7.1  Total Bilirubin 0.0 - 1.2 mg/dL - 0.2 0.3  Alkaline Phos 39 - 117 IU/L - 143(H) 134(H)  AST 0 - 40 IU/L - 21 29  ALT 0 - 44 IU/L - 25 33    Lipid Panel     Component Value Date/Time   CHOL 193 09/19/2016 1023   TRIG 187 (H) 09/19/2016 1023   HDL 68 09/19/2016 1023   CHOLHDL 2.8 09/19/2016 1023   VLDL 37 (H) 09/19/2016 1023   LDLCALC 88 09/19/2016 1023    Lab Results  Component Value Date   HGBA1C 5.6 12/01/2017    Assessment & Plan:   1. Type 2 diabetes mellitus with other neurologic complication, without long-term current use of insulin  (HCC) Diet controlled with A1c of 5.6 Foot exam performed today Counseled on Diabetic diet, my plate method, 062 minutes of moderate intensity exercise/week Keep blood sugar logs with fasting goals of 80-120 mg/dl, random of less than 180 and in the event of sugars less than 60 mg/dl or greater than 400 mg/dl please notify the clinic ASAP. It is recommended that you undergo annual eye exams and annual foot exams. Pneumonia vaccine is recommended. - POCT glucose (manual entry) - POCT glycosylated hemoglobin (Hb A1C) - CMP14+EGFR; Future - Lipid panel; Future - Microalbumin/Creatinine Ratio, Urine; Future - atorvastatin (LIPITOR) 20 MG tablet; Take 1 tablet (20 mg total) by mouth daily.  Dispense: 30 tablet;  Refill: 3  2. Uncontrolled hypertension Uncontrolled due to noncompliance We have discussed implications and complications of noncompliance, uncontrolled hypertension and he promises to do better Counseled on blood pressure goal of less than 130/80, low-sodium, DASH diet, medication compliance, 150 minutes of moderate intensity exercise per week. Discussed medication compliance, adverse effects. - amLODipine (NORVASC) 10 MG tablet; Take 1 tablet (10 mg total) by mouth daily.  Dispense: 30 tablet; Refill: 3 - metoprolol succinate (TOPROL-XL) 25 MG 24 hr tablet; Take 1 tablet (25 mg total) by mouth daily.  Dispense: 45 tablet; Refill: 3  3. Graves disease Status post radioactive treatments Currently not on medications - T4, free; Future - TSH; Future  4. Stage 3 chronic kidney disease (HCC) Stage III-IV Secondary to hypertensive and diabetic nephropathy in addition to noncompliance Avoid nephrotoxic agents Follow-up with [facility]  5. Non compliance with medical treatment Discussed complications of noncompliance He is promising to work on this  6. Arthritis Left hip osteoarthritis Unable to use oral NSAIDs due to CKD Will use topical NSAID - diclofenac sodium  (VOLTAREN) 1 % GEL; Apply 4 g topically 4 (four) times daily.  Dispense: 100 g; Refill: 3   Meds ordered this encounter  Medications  . amLODipine (NORVASC) 10 MG tablet    Sig: Take 1 tablet (10 mg total) by mouth daily.    Dispense:  30 tablet    Refill:  3  . metoprolol succinate (TOPROL-XL) 25 MG 24 hr tablet    Sig: Take 1 tablet (25 mg total) by mouth daily.    Dispense:  45 tablet    Refill:  3  . atorvastatin (LIPITOR) 20 MG tablet    Sig: Take 1 tablet (20 mg total) by mouth daily.    Dispense:  30 tablet    Refill:  3  . diclofenac sodium (VOLTAREN) 1 % GEL    Sig: Apply 4 g topically 4 (four) times daily.    Dispense:  100 g    Refill:  3    Follow-up: Return in about 3 months (around 03/03/2018) for follow up of chronic medical conditions.   [name] MD

## 2017-12-01 NOTE — Patient Instructions (Signed)
Chronic Kidney Disease, Adult Chronic kidney disease (CKD) happens when the kidneys are damaged during a time of 3 or more months. The kidneys are two organs that do many important jobs in the body. These jobs include:  Removing wastes and extra fluids from the blood.  Making hormones that maintain the amount of fluid in your tissues and blood vessels.  Making sure that the body has the right amount of fluids and chemicals.  Most of the time, this condition does not go away, but it can usually be controlled. Steps must be taken to slow down the kidney damage or stop it from getting worse. Otherwise, the kidneys may stop working. Follow these instructions at home:  Follow your diet as told by your doctor. You may need to avoid alcohol, salty foods (sodium), and foods that are high in potassium, calcium, and protein.  Take over-the-counter and prescription medicines only as told by your doctor. Do not take any new medicines unless your doctor says you can do that. These include vitamins and minerals. ? Medicines and nutritional supplements can make kidney damage worse. ? Your doctor may need to change how much medicine you take.  Do not use any tobacco products. These include cigarettes, chewing tobacco, and e-cigarettes. If you need help quitting, ask your doctor.  Keep all follow-up visits as told by your doctor. This is important.  Check your blood pressure. Tell your doctor if there are changes to your blood pressure.  Get to a healthy weight. Stay at that weight. If you need help with this, ask your doctor.  Start or continue an exercise plan. Try to exercise at least 30 minutes a day, 5 days a week.  Stay up-to-date with your shots (immunizations) as told by your doctor. Contact a doctor if:  Your symptoms get worse.  You have new symptoms. Get help right away if:  You have symptoms of end-stage kidney disease. These include: ? Headaches. ? Skin that is darker or lighter  than normal. ? Numbness in your hands or feet. ? Easy bruising. ? Having hiccups often. ? Chest pain. ? Shortness of breath. ? Stopping of menstrual periods in women.  You have a fever.  You are making very little pee (urine).  You have pain or bleeding when you pee (urinate). This information is not intended to replace advice given to you by your health care provider. Make sure you discuss any questions you have with your health care provider. Document Released: 12/14/2009 Document Revised: 02/25/2016 Document Reviewed: 05/18/2012 Elsevier Interactive Patient Education  2017 [employer]  

## 2017-12-12 ENCOUNTER — Other Ambulatory Visit: Payer: Self-pay

## 2017-12-21 ENCOUNTER — Other Ambulatory Visit: Payer: Self-pay | Admitting: Nephrology

## 2017-12-21 ENCOUNTER — Ambulatory Visit
Admission: RE | Admit: 2017-12-21 | Discharge: 2017-12-21 | Disposition: A | Discharge status: Home / Self Care | Payer: No Typology Code available for payment source | Source: Ambulatory Visit | Attending: Nephrology | Admitting: Nephrology

## 2017-12-21 DIAGNOSIS — N183 Chronic kidney disease, stage 3 unspecified: Secondary | ICD-10-CM

## 2018-01-01 ENCOUNTER — Other Ambulatory Visit: Payer: Self-pay | Admitting: Internal Medicine

## 2018-01-03 ENCOUNTER — Other Ambulatory Visit: Payer: Self-pay | Admitting: Nephrology

## 2018-01-03 DIAGNOSIS — N183 Chronic kidney disease, stage 3 unspecified: Secondary | ICD-10-CM

## 2018-02-12 MED FILL — METOPROLOL SUCCINATE ER 100: 100 | 30 days supply | Qty: 30 | Fill #0

## 2018-02-20 MED FILL — CALCITRIOL 0.5 MCG CAPS: 0.5 | 30 days supply | Qty: 30 | Fill #0

## 2018-03-06 ENCOUNTER — Encounter: Payer: Self-pay | Admitting: Family Medicine

## 2018-03-06 ENCOUNTER — Ambulatory Visit: Payer: Self-pay | Attending: Family Medicine | Admitting: Family Medicine

## 2018-03-06 VITALS — BP 183/96 | HR 69 | Temp 98.1°F | Ht 72.0 in | Wt 220.4 lb

## 2018-03-06 DIAGNOSIS — N183 Chronic kidney disease, stage 3 unspecified: Secondary | ICD-10-CM

## 2018-03-06 DIAGNOSIS — I129 Hypertensive chronic kidney disease with stage 1 through stage 4 chronic kidney disease, or unspecified chronic kidney disease: Secondary | ICD-10-CM | POA: Insufficient documentation

## 2018-03-06 DIAGNOSIS — Z91199 Patient's noncompliance with other medical treatment and regimen due to unspecified reason: Secondary | ICD-10-CM

## 2018-03-06 DIAGNOSIS — E1165 Type 2 diabetes mellitus with hyperglycemia: Secondary | ICD-10-CM

## 2018-03-06 DIAGNOSIS — E118 Type 2 diabetes mellitus with unspecified complications: Secondary | ICD-10-CM

## 2018-03-06 DIAGNOSIS — Z79899 Other long term (current) drug therapy: Secondary | ICD-10-CM | POA: Insufficient documentation

## 2018-03-06 DIAGNOSIS — E1122 Type 2 diabetes mellitus with diabetic chronic kidney disease: Secondary | ICD-10-CM | POA: Insufficient documentation

## 2018-03-06 DIAGNOSIS — M2550 Pain in unspecified joint: Secondary | ICD-10-CM | POA: Insufficient documentation

## 2018-03-06 DIAGNOSIS — I10 Essential (primary) hypertension: Secondary | ICD-10-CM

## 2018-03-06 DIAGNOSIS — Z9889 Other specified postprocedural states: Secondary | ICD-10-CM | POA: Insufficient documentation

## 2018-03-06 DIAGNOSIS — E1169 Type 2 diabetes mellitus with other specified complication: Secondary | ICD-10-CM

## 2018-03-06 DIAGNOSIS — M1A09X0 Idiopathic chronic gout, multiple sites, without tophus (tophi): Secondary | ICD-10-CM | POA: Insufficient documentation

## 2018-03-06 DIAGNOSIS — Z9119 Patient's noncompliance with other medical treatment and regimen: Secondary | ICD-10-CM | POA: Insufficient documentation

## 2018-03-06 LAB — GLUCOSE, POCT (MANUAL RESULT ENTRY): POC GLUCOSE: 144 mg/dL — AB (ref 70–99)

## 2018-03-06 NOTE — Progress Notes (Signed)
Subjective:  Patient ID: Edward [name], male    DOB: 28-Jan-1971  Age: 47 y.o. MRN: [id number]  CC: Hypertension and Diabetes   HPI Edward [name] is a 47 year old male with a history of hypertension, type 2 diabetes mellitus (A1c 5.6), Graves' disease (status post radioactive iodine treatment in 07/2017), stage III chronic kidney disease, erectile dysfunction who presents today for a follow-up visit. His blood pressure is elevated today and he has not been taking his antihypertensives for the last 1 week; states he forgot them in another car. He had a visit to his nephrologist with [facility] on 01/31/2018 and serum creatinine was 3.42; Toprol-XL was increased from 25 mg to 100 mg daily in addition to his amlodipine. With regards to his Graves' disease he has not been compliant with endocrine follow-ups. He complains of arthralgias in his hips, knees, shoulders and informs me "I have gout in all my body."  Last serum uric acid was 7.3 in 12/2016.  Past Medical History:  Diagnosis Date  . Anemia   . Arthritis   . Chronic kidney disease   . Diabetes mellitus   . Graves disease 2014  . Headache   . Hypertension   . Non-compliance   . Shortness of breath dyspnea     Past Surgical History:  Procedure Laterality Date  . CIRCUMCISION     as a child, around 38 years of age  . EYE SURGERY    . PARS PLANA VITRECTOMY Right 03/25/2016   Procedure: PARS PLANA VITRECTOMY 25 GAUGE FOR ENDOPHTHALMITIS;  Surgeon: Jalene Mullet, MD;  Location: [facility];  Service: Ophthalmology;  Laterality: Right;  . WISDOM TOOTH EXTRACTION      No Known Allergies   Outpatient Medications Prior to Visit  Medication Sig Dispense Refill  . acetaminophen (TYLENOL) 325 MG tablet Take 650 mg by mouth every 6 (six) hours as needed for mild pain.    Marland Kitchen amLODipine (NORVASC) 10 MG tablet Take 1 tablet (10 mg total) by mouth daily. (Patient not taking: Reported on 03/06/2018) 30 tablet 3  .  atorvastatin (LIPITOR) 20 MG tablet Take 1 tablet (20 mg total) by mouth daily. (Patient not taking: Reported on 03/06/2018) 30 tablet 3  . diclofenac sodium (VOLTAREN) 1 % GEL Apply 4 g topically 4 (four) times daily. (Patient not taking: Reported on 03/06/2018) 100 g 3  . metoprolol succinate (TOPROL-XL) 25 MG 24 hr tablet Take 1 tablet (25 mg total) by mouth daily. (Patient not taking: Reported on 03/06/2018) 45 tablet 3   No facility-administered medications prior to visit.     ROS Review of Systems  Constitutional: Negative for activity change and appetite change.  HENT: Negative for sinus pressure and sore throat.   Eyes: Negative for visual disturbance.  Respiratory: Negative for cough, chest tightness and shortness of breath.   Cardiovascular: Negative for chest pain and leg swelling.  Gastrointestinal: Negative for abdominal distention, abdominal pain, constipation and diarrhea.  Endocrine: Negative.   Genitourinary: Negative for dysuria.  Musculoskeletal:       See hpi  Skin: Negative for rash.  Allergic/Immunologic: Negative.   Neurological: Negative for weakness, light-headedness and numbness.  Psychiatric/Behavioral: Negative for dysphoric mood and suicidal ideas.    Objective:  BP (!) 183/96   Pulse 69   Temp 98.1 F (36.7 C) (Oral)   Ht 6' (1.829 m)   Wt 220 lb 6.4 oz (100 kg)   SpO2 100%   BMI 29.89 kg/m   BP/Weight  03/06/2018 10/08/1094 0/01/5408  Systolic BP 811 914 782  Diastolic BP 96 80 956  Wt. (Lbs) 220.4 213 211  BMI 29.89 28.89 28.62     Physical Exam  Constitutional: He is oriented to person, place, and time. He appears well-developed and well-nourished.  Cardiovascular: Normal rate, normal heart sounds and intact distal pulses.  No murmur heard. Pulmonary/Chest: Effort normal and breath sounds normal. He has no wheezes. He has no rales. He exhibits no tenderness.  Abdominal: Soft. Bowel sounds are normal. He exhibits no distension and no mass. There is  no tenderness.  Musculoskeletal: Normal range of motion.  Neurological: He is alert and oriented to person, place, and time.  Skin: Skin is warm and dry.  Psychiatric: He has a normal mood and affect.     CMP Latest Ref Rng & Units 11/05/2017 08/31/2017 07/18/2017  Glucose 65 - 99 mg/dL 124(H) 128(H) 103(H)  BUN 6 - 20 mg/dL 40(H) 40(H) 39(H)  Creatinine 0.61 - 1.24 mg/dL 3.14(H) 2.80(H) 2.75(H)  Sodium 135 - 145 mmol/L 140 144 143  Potassium 3.5 - 5.1 mmol/L 4.7 5.4(H) 6.1(H)  Chloride 101 - 111 mmol/L 111 112(H) 114(H)  CO2 22 - 32 mmol/L 20(L) 19(L) 18(L)  Calcium 8.9 - 10.3 mg/dL 8.8(L) 8.9 9.1  Total Protein 6.0 - 8.5 g/dL - 6.4 7.1  Total Bilirubin 0.0 - 1.2 mg/dL - 0.2 0.3  Alkaline Phos 39 - 117 IU/L - 143(H) 134(H)  AST 0 - 40 IU/L - 21 29  ALT 0 - 44 IU/L - 25 33    Lab Results  Component Value Date   HGBA1C 5.6 12/01/2017    Assessment & Plan:   1. Type 2 diabetes mellitus with other specified complication, without long-term current use of insulin (HCC) Diet controlled with A1c of 5.6 Continue diabetic diet - POCT glucose (manual entry)  2. Uncontrolled hypertension Uncontrolled due to noncompliance Metoprolol was increased to 100 mg daily by his nephrologist to be taken along with 10 mg of amlodipine I encouraged him to comply with his medications and a low-sodium diet  3. Non compliance with medical treatment We have again discussed implications of noncompliance and complications of untreated hypertension and his chronic kidney disease as we have done at other visits.  4. 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 Arthralgia, unspecified joint Likely underlying osteoarthritis Unable to use NSAIDs due to CKD Declines tramadol or Tylenol 3  6. Idiopathic chronic gout of multiple sites without tophus No acute  flares Last serum uric acid was 7.3, will check today - Uric Acid   No orders of the defined types were placed in this encounter.   Follow-up: Return in about 3 months (around 06/06/2018) for follow up of chronic medical conditions.   [name] MD

## 2018-03-06 NOTE — Patient Instructions (Signed)

## 2018-03-07 ENCOUNTER — Other Ambulatory Visit: Payer: Self-pay | Admitting: Family Medicine

## 2018-03-07 LAB — URIC ACID: Uric Acid: 8.9 mg/dL — ABNORMAL HIGH (ref 3.7–8.6)

## 2018-03-07 LAB — CMP14+EGFR
ALBUMIN: 3.8 g/dL (ref 3.5–5.5)
ALT: 30 IU/L (ref 0–44)
AST: 30 IU/L (ref 0–40)
Albumin/Globulin Ratio: 1.2 (ref 1.2–2.2)
Alkaline Phosphatase: 121 IU/L — ABNORMAL HIGH (ref 39–117)
BUN / CREAT RATIO: 11 (ref 9–20)
BUN: 43 mg/dL — AB (ref 6–24)
Bilirubin Total: 0.2 mg/dL (ref 0.0–1.2)
CALCIUM: 8.7 mg/dL (ref 8.7–10.2)
CO2: 17 mmol/L — ABNORMAL LOW (ref 20–29)
Chloride: 113 mmol/L — ABNORMAL HIGH (ref 96–106)
Creatinine, Ser: 3.82 mg/dL — ABNORMAL HIGH (ref 0.76–1.27)
GFR calc Af Amer: 20 mL/min/{1.73_m2} — ABNORMAL LOW (ref >59)
GFR, EST NON AFRICAN AMERICAN: 18 mL/min/{1.73_m2} — AB (ref >59)
GLOBULIN, TOTAL: 3.1 g/dL (ref 1.5–4.5)
GLUCOSE: 118 mg/dL — AB (ref 65–99)
Potassium: 5.3 mmol/L — ABNORMAL HIGH (ref 3.5–5.2)
SODIUM: 144 mmol/L (ref 134–144)
Total Protein: 6.9 g/dL (ref 6.0–8.5)

## 2018-03-07 MED ORDER — PREDNISONE 20 MG PO TABS: 20.0000 mg | ORAL_TABLET | Freq: Every day | ORAL | 0 refills | Status: DC

## 2018-03-21 ENCOUNTER — Encounter (HOSPITAL_COMMUNITY): Payer: Self-pay | Admitting: Emergency Medicine

## 2018-03-21 ENCOUNTER — Emergency Department (HOSPITAL_COMMUNITY): Payer: Self-pay

## 2018-03-21 ENCOUNTER — Emergency Department (HOSPITAL_COMMUNITY)
Admission: EM | Admit: 2018-03-21 | Discharge: 2018-03-21 | Disposition: A | Discharge status: Home / Self Care | Payer: Self-pay | Attending: Emergency Medicine | Admitting: Emergency Medicine

## 2018-03-21 DIAGNOSIS — R7989 Other specified abnormal findings of blood chemistry: Secondary | ICD-10-CM

## 2018-03-21 DIAGNOSIS — R112 Nausea with vomiting, unspecified: Secondary | ICD-10-CM

## 2018-03-21 DIAGNOSIS — I10 Essential (primary) hypertension: Secondary | ICD-10-CM

## 2018-03-21 DIAGNOSIS — R059 Cough, unspecified: Secondary | ICD-10-CM

## 2018-03-21 DIAGNOSIS — E1122 Type 2 diabetes mellitus with diabetic chronic kidney disease: Secondary | ICD-10-CM | POA: Insufficient documentation

## 2018-03-21 DIAGNOSIS — Z79899 Other long term (current) drug therapy: Secondary | ICD-10-CM | POA: Insufficient documentation

## 2018-03-21 DIAGNOSIS — R05 Cough: Secondary | ICD-10-CM

## 2018-03-21 DIAGNOSIS — Z87891 Personal history of nicotine dependence: Secondary | ICD-10-CM | POA: Insufficient documentation

## 2018-03-21 DIAGNOSIS — J029 Acute pharyngitis, unspecified: Secondary | ICD-10-CM | POA: Insufficient documentation

## 2018-03-21 DIAGNOSIS — N189 Chronic kidney disease, unspecified: Secondary | ICD-10-CM | POA: Insufficient documentation

## 2018-03-21 DIAGNOSIS — I129 Hypertensive chronic kidney disease with stage 1 through stage 4 chronic kidney disease, or unspecified chronic kidney disease: Secondary | ICD-10-CM | POA: Insufficient documentation

## 2018-03-21 LAB — URINALYSIS, ROUTINE W REFLEX MICROSCOPIC
Bilirubin Urine: NEGATIVE
Glucose, UA: 150 mg/dL — AB
Ketones, ur: NEGATIVE mg/dL
Leukocytes, UA: NEGATIVE
Nitrite: NEGATIVE
Protein, ur: 100 mg/dL — AB
Specific Gravity, Urine: 1.011 (ref 1.005–1.030)
pH: 5 (ref 5.0–8.0)

## 2018-03-21 LAB — COMPREHENSIVE METABOLIC PANEL
ALT: 37 U/L (ref 17–63)
AST: 43 U/L — ABNORMAL HIGH (ref 15–41)
Albumin: 3.5 g/dL (ref 3.5–5.0)
Alkaline Phosphatase: 95 U/L (ref 38–126)
Anion gap: 9 (ref 5–15)
BUN: 32 mg/dL — ABNORMAL HIGH (ref 6–20)
CO2: 22 mmol/L (ref 22–32)
Calcium: 8.6 mg/dL — ABNORMAL LOW (ref 8.9–10.3)
Chloride: 108 mmol/L (ref 101–111)
Creatinine, Ser: 3.67 mg/dL — ABNORMAL HIGH (ref 0.61–1.24)
GFR calc Af Amer: 21 mL/min — ABNORMAL LOW (ref >60)
GFR calc non Af Amer: 18 mL/min — ABNORMAL LOW (ref >60)
Glucose, Bld: 206 mg/dL — ABNORMAL HIGH (ref 65–99)
Potassium: 3.7 mmol/L (ref 3.5–5.1)
Sodium: 139 mmol/L (ref 135–145)
Total Bilirubin: 0.6 mg/dL (ref 0.3–1.2)
Total Protein: 8.1 g/dL (ref 6.5–8.1)

## 2018-03-21 LAB — CBC WITH DIFFERENTIAL/PLATELET
Abs Immature Granulocytes: 0 10*3/uL (ref 0.0–0.1)
Basophils Absolute: 0 10*3/uL (ref 0.0–0.1)
Basophils Relative: 0 %
Eosinophils Absolute: 0.3 10*3/uL (ref 0.0–0.7)
Eosinophils Relative: 3 %
HCT: 33.7 % — ABNORMAL LOW (ref 39.0–52.0)
Hemoglobin: 11.2 g/dL — ABNORMAL LOW (ref 13.0–17.0)
Immature Granulocytes: 0 %
Lymphocytes Relative: 21 %
Lymphs Abs: 1.9 10*3/uL (ref 0.7–4.0)
MCH: 31.3 pg (ref 26.0–34.0)
MCHC: 33.2 g/dL (ref 30.0–36.0)
MCV: 94.1 fL (ref 78.0–100.0)
Monocytes Absolute: 0.7 10*3/uL (ref 0.1–1.0)
Monocytes Relative: 8 %
Neutro Abs: 6.1 10*3/uL (ref 1.7–7.7)
Neutrophils Relative %: 68 %
Platelets: 220 10*3/uL (ref 150–400)
RBC: 3.58 MIL/uL — ABNORMAL LOW (ref 4.22–5.81)
RDW: 13.1 % (ref 11.5–15.5)
WBC: 8.9 10*3/uL (ref 4.0–10.5)

## 2018-03-21 LAB — LIPASE, BLOOD: Lipase: 69 U/L — ABNORMAL HIGH (ref 11–51)

## 2018-03-21 LAB — GROUP A STREP BY PCR: GROUP A STREP BY PCR: NOT DETECTED

## 2018-03-21 MED ORDER — SODIUM CHLORIDE 0.9 % IV BOLUS
1000.0000 mL | Freq: Once | INTRAVENOUS | Status: AC
  Administered 2018-03-21: 1000 mL via INTRAVENOUS

## 2018-03-21 MED ORDER — FLUTICASONE PROPIONATE 50 MCG/ACT NA SUSP: 2.0000 | Freq: Every day | NASAL | 0 refills | Status: DC

## 2018-03-21 MED ORDER — ACETAMINOPHEN 500 MG PO TABS: 500.0000 mg | ORAL_TABLET | Freq: Four times a day (QID) | ORAL | 0 refills | Status: DC | PRN

## 2018-03-21 MED ORDER — PHENOL 1.4 % MT LIQD: 1.0000 | OROMUCOSAL | 0 refills | Status: DC | PRN

## 2018-03-21 NOTE — Discharge Instructions (Signed)
Flonase for nasal congestion.  Chloraseptic for sore throat.  Tylenol as needed for pain, do not exceed 3000 mg of Tylenol daily.  May also use warm water salt gargles, warm teas, honey for sore throat pain.  Drink plenty water and get plenty rest.  Follow-up with your PCP for reevaluation of your symptoms, high blood pressure, and abnormal kidney function.  Return to the emergency department if any concerning signs or symptoms develop such as fevers, severe headaches, persistent vomiting, chest pain, shortness of breath.

## 2018-03-21 NOTE — ED Notes (Signed)
Patient transported to X-ray 

## 2018-03-21 NOTE — ED Triage Notes (Signed)
Pt presents with cough that lead to sore throat and generalized body aches and chills x 1 wks; pt states he was here recently with his children who were dx with strep throat, had them taken care of first and now he wants seen; pt reports hx of HTN, CKD, and graves disease, no OTC medications attempted

## 2018-03-21 NOTE — ED Notes (Signed)
This Rn attempted iv access twice without success, another RN to try

## 2018-03-21 NOTE — ED Provider Notes (Signed)
[facility] EMERGENCY DEPARTMENT Provider Note   CSN: [id number] Arrival date & time: 03/21/18  1817     History   Chief Complaint Chief Complaint  Patient presents with  . Sore Throat  . Cough  . Generalized Body Aches  . Chills    HPI Edward [name] is a 47 y.o. male with history of anemia, CKD stage V, type 2 diabetes mellitus, Graves' disease, hypertension presents for evaluation of gradual onset, progressively worsening cough and sore throat for 1 week.  He states his symptoms initially began with a cough productive of yellow-green sputum.  He states this has persisted and he has since developed a sore throat, nasal congestion.  Denies fevers or chills.  No facial swelling or drooling.  No difficulty swallowing.  He notes shortness of breath at rest which worsens when his pain worsens and with persistent cough.  He notes anterior dull chest pain with cough only, pain is not pleuritic or exertional.  He also notes at least one episode of nonbloody nonbilious emesis daily since his symptoms began 1 week ago.  He notes intermittent crampy abdominal pain which today became constant and localized to the epigastric and right upper quadrant regions.  Pain does not radiate, worsens with cough, vomiting, and bending.  Denies diarrhea, last bowel movement was yesterday.  He does note decreased urine output but denies dysuria, hematuria. he has not tried anything for his symptoms.  He is a former smoker, quit 15 years ago.  He states he has not taken his blood pressure medicines for several months because "I do not like how they make me feel ".  Of note, his children tested positive for strep throat approximately 2 weeks ago.  He is unsure if this is related.  The history is provided by the patient.    Past Medical History:  Diagnosis Date  . Anemia   . Arthritis   . Chronic kidney disease    stage 5  . Diabetes mellitus   . Graves disease 2014  . Headache   .  Hypertension   . Non-compliance   . Shortness of breath dyspnea     Patient Active Problem List   Diagnosis Date Noted  . ED (erectile dysfunction) 08/10/2017  . Chronic kidney disease 07/18/2017  . Arthralgia 07/18/2017  . Arthritis 07/18/2017  . Graves disease 03/02/2016  . Non compliance with medical treatment 11/26/2015  . Uncontrolled hypertension 11/26/2015  . Acute kidney injury superimposed on chronic kidney disease ([place]) 11/25/2015  . Chest pain at rest 11/25/2015  . Hyperkalemia 11/25/2015  . Diabetes mellitus type 2, uncontrolled, with complications ([place]) 30/16/0109    Past Surgical History:  Procedure Laterality Date  . CIRCUMCISION     as a child, around 54 years of age  . EYE SURGERY    . PARS PLANA VITRECTOMY Right 03/25/2016   Procedure: PARS PLANA VITRECTOMY 25 GAUGE FOR ENDOPHTHALMITIS;  Surgeon: Jalene Mullet, MD;  Location: [facility];  Service: Ophthalmology;  Laterality: Right;  . WISDOM TOOTH EXTRACTION          Home Medications    Prior to Admission medications   Medication Sig Start Date End Date Taking? Authorizing Provider  acetaminophen (TYLENOL) 500 MG tablet Take 1 tablet (500 mg total) by mouth every 6 (six) hours as needed. 03/21/18   Elaria Osias A, PA-C  amLODipine (NORVASC) 10 MG tablet Take 1 tablet (10 mg total) by mouth daily. Patient not taking: Reported on 03/06/2018 12/01/17  [name], MD  atorvastatin (LIPITOR) 20 MG tablet Take 1 tablet (20 mg total) by mouth daily. Patient not taking: Reported on 03/06/2018 12/01/17   [name], MD  diclofenac sodium (VOLTAREN) 1 % GEL Apply 4 g topically 4 (four) times daily. Patient not taking: Reported on 03/06/2018 12/01/17   [name], MD  fluticasone (FLONASE) 50 MCG/ACT nasal spray Place 2 sprays into both nostrils daily. 03/21/18   Albertus Chiarelli A, PA-C  metoprolol succinate (TOPROL-XL) 25 MG 24 hr tablet Take 1 tablet (25 mg total) by mouth daily. Patient not taking: Reported on 03/06/2018  12/01/17   [name], MD  phenol (CHLORASEPTIC) 1.4 % LIQD Use as directed 1 spray in the mouth or throat as needed for throat irritation / pain. 03/21/18   Nils Flack, Ailyne Pawley A, PA-C  predniSONE (DELTASONE) 20 MG tablet Take 1 tablet (20 mg total) by mouth daily with breakfast. 03/07/18   [name], MD    Family History Family History  Problem Relation Age of Onset  . Hypertension Mother   . Diabetes Mother     Social History Social History   Tobacco Use  . Smoking status: Former Smoker    Packs/day: 0.75    Years: 3.00    Pack years: 2.25    Types: Cigarettes    Last attempt to quit: 05/28/2000    Years since quitting: 17.8  . Smokeless tobacco: Never Used  Substance Use Topics  . Alcohol use: Yes    Alcohol/week: 0.0 oz    Comment: 1 beer every night  . Drug use: No     Allergies   Patient has no known allergies.   Review of Systems Review of Systems  Constitutional: Negative for chills and fever.  HENT: Positive for congestion and sore throat.   Respiratory: Positive for cough and shortness of breath.   Cardiovascular: Positive for chest pain.  Gastrointestinal: Positive for abdominal pain, nausea and vomiting. Negative for constipation and diarrhea.  Genitourinary: Positive for decreased urine volume. Negative for dysuria, frequency and hematuria.  All other systems reviewed and are negative.    Physical Exam Updated Vital Signs BP (!) 180/100 (BP Location: Left Arm)   Pulse 80   Temp 99.8 F (37.7 C) (Oral)   Resp 18   SpO2 100%   Physical Exam  Constitutional: He appears well-developed and well-nourished. No distress.  Resting comfortably in chair  HENT:  Head: Normocephalic and atraumatic.  Right Ear: Tympanic membrane normal.  Left Ear: Tympanic membrane normal.  Mouth/Throat: Uvula is midline, oropharynx is clear and moist and mucous membranes are normal. No uvula swelling. No oropharyngeal exudate, posterior oropharyngeal edema or posterior  oropharyngeal erythema. No tonsillar exudate.  No frontal or maxillary sinus tenderness.  Nasal septum is midline with moderate mucosal edema bilaterally.  No trismus or sublingual abnormalities.  Patient tolerating secretions without difficulty.  Eyes: Conjunctivae are normal. Right eye exhibits no discharge. Left eye exhibits no discharge.  Neck: Normal range of motion and full passive range of motion without pain. Neck supple. No JVD present. No tracheal deviation present.  Cardiovascular: Normal rate, regular rhythm, normal heart sounds and intact distal pulses.  2+ radial and DP/PT pulses bl, negative Homan's bl  Pulmonary/Chest: Effort normal and breath sounds normal. No respiratory distress. He has no wheezes. He has no rhonchi. He has no rales. He exhibits no tenderness.  Equal rise and fall of chest, no increased work of breathing.  Speaking in full sentences without difficulty.  Abdominal: Soft. Bowel sounds are normal. He exhibits no distension. There is tenderness.  Generalized tenderness to palpation, worst in the epigastric and right upper quadrant regions.  Murphy sign absent, Rovsing's absent, left CVA tenderness present.  Musculoskeletal: He exhibits no edema.  Lymphadenopathy:    He has no cervical adenopathy.  Neurological: He is alert.  Skin: Skin is warm and dry. No erythema.  Psychiatric: He has a normal mood and affect. His behavior is normal.  Nursing note and vitals reviewed.    ED Treatments / Results  Labs (all labs ordered are listed, but only abnormal results are displayed) Labs Reviewed  CBC WITH DIFFERENTIAL/PLATELET - Abnormal; Notable for the following components:      Result Value   RBC 3.58 (*)    Hemoglobin 11.2 (*)    HCT 33.7 (*)    All other components within normal limits  COMPREHENSIVE METABOLIC PANEL - Abnormal; Notable for the following components:   Glucose, Bld 206 (*)    BUN 32 (*)    Creatinine, Ser 3.67 (*)    Calcium 8.6 (*)    AST  43 (*)    GFR calc non Af Amer 18 (*)    GFR calc Af Amer 21 (*)    All other components within normal limits  LIPASE, BLOOD - Abnormal; Notable for the following components:   Lipase 69 (*)    All other components within normal limits  URINALYSIS, ROUTINE W REFLEX MICROSCOPIC - Abnormal; Notable for the following components:   Glucose, UA 150 (*)    Hgb urine dipstick MODERATE (*)    Protein, ur 100 (*)    Bacteria, UA RARE (*)    All other components within normal limits  GROUP A STREP BY PCR    EKG EKG Interpretation  Date/Time:  Wednesday March 21 2018 19:20:04 EDT Ventricular Rate:  81 PR Interval:  170 QRS Duration: 84 QT Interval:  358 QTC Calculation: 415 R Axis:   -9 Text Interpretation:  Normal sinus rhythm Nonspecific T wave abnormality Abnormal ECG No significant change since last tracing Confirmed by [facility] [phone]) on 03/22/2018 12:51:09 PM   Radiology Dg Chest 2 View  Result Date: 03/21/2018 CLINICAL DATA:  Productive cough, shortness of breath, and chest pain for 1 week. EXAM: CHEST - 2 VIEW COMPARISON:  11/05/2017 FINDINGS: The heart size and mediastinal contours are within normal limits. Both lungs are clear. The visualized skeletal structures are unremarkable. IMPRESSION: No active cardiopulmonary disease. Electronically Signed   By: Earle Gell M.D.   On: 03/21/2018 19:44    Procedures Procedures (including critical care time)  Medications Ordered in ED Medications  sodium chloride 0.9 % bolus 1,000 mL (0 mLs Intravenous Stopped 03/21/18 2127)     Initial Impression / Assessment and Plan / ED Course  I have reviewed the triage vital signs and the nursing notes.  Pertinent labs & imaging results that were available during my care of the patient were reviewed by me and considered in my medical decision making (see chart for details).     Patient presents for evaluation of flulike symptoms for 1 week.  He is afebrile, persistently hypertensive  but this appears to be his baseline.  He states he does not take his blood pressure medications because he does not like how they make him feel.  Symptoms include productive cough, sore throat, and nausea and vomiting.  He has anterior aching chest pain with cough only, does not appear to be  cardiac in etiology.  His EKG shows no significant changes from last tracing.  I doubt ACS/MI, or PE.  Chest x-ray shows no acute cardia pulmonary disease, no evidence of edema or consolidation.  Lab work reviewed by me is at the patient's baseline.  He has a baseline creatinine of around 3-4.  No leukocytosis, mild stable anemia.  Lipase is mildly elevated but not 3 times the upper limit of normal and I have a low suspicion of pancreatitis.  UA is not concerning for UTI or nephrolithiasis.  He has generalized tenderness to palpation of the abdomen which is mildly more severe in the right upper quadrant and epigastric regions.  I do not see need for emergent imaging of the abdomen or chest at this time, doubt acute surgical abdominal pathology.  Strep test was negative, no evidence of peritonsillar abscess or soft tissue neck infection.  Patient was given IV fluid bolus, declined nausea medicines.  On reevaluation he is resting comfortably no apparent distress.  I suspect viral process.  Recommend symptomatic treatment with Tylenol, Flonase for nasal congestion, clear septic spray for sore throat.  Encouraged continued hydration.  Recommend follow-up with his PCP for reevaluation of his hypertension, elevated creatinine, and his symptoms.  Discussed strict ED return precautions. Pt verbalized understanding of and agreement with plan and is safe for discharge home at this time.   Final Clinical Impressions(s) / ED Diagnoses   Final diagnoses:  Cough  Sore throat  Nausea and vomiting in adult  Hypertension, unspecified type  Elevated serum creatinine    ED Discharge Orders        Ordered    fluticasone (FLONASE) 50  MCG/ACT nasal spray  Daily     03/21/18 2139    acetaminophen (TYLENOL) 500 MG tablet  Every 6 hours PRN     03/21/18 2139    phenol (CHLORASEPTIC) 1.4 % LIQD  As needed     03/21/18 2139       Renita Papa, PA-C 03/22/18 1716    Davonna Belling, MD 03/23/18 1513

## 2018-06-06 ENCOUNTER — Encounter: Payer: Self-pay | Admitting: Family Medicine

## 2018-06-06 ENCOUNTER — Ambulatory Visit: Payer: Self-pay | Attending: Family Medicine | Admitting: Family Medicine

## 2018-06-06 ENCOUNTER — Other Ambulatory Visit: Payer: Self-pay

## 2018-06-06 VITALS — BP 174/106 | HR 75 | Temp 98.4°F | Ht 72.0 in | Wt 234.8 lb

## 2018-06-06 DIAGNOSIS — E118 Type 2 diabetes mellitus with unspecified complications: Secondary | ICD-10-CM

## 2018-06-06 DIAGNOSIS — E1165 Type 2 diabetes mellitus with hyperglycemia: Secondary | ICD-10-CM

## 2018-06-06 DIAGNOSIS — N529 Male erectile dysfunction, unspecified: Secondary | ICD-10-CM | POA: Insufficient documentation

## 2018-06-06 DIAGNOSIS — I129 Hypertensive chronic kidney disease with stage 1 through stage 4 chronic kidney disease, or unspecified chronic kidney disease: Secondary | ICD-10-CM | POA: Insufficient documentation

## 2018-06-06 DIAGNOSIS — Z79899 Other long term (current) drug therapy: Secondary | ICD-10-CM | POA: Insufficient documentation

## 2018-06-06 DIAGNOSIS — E0500 Thyrotoxicosis with diffuse goiter without thyrotoxic crisis or storm: Secondary | ICD-10-CM | POA: Insufficient documentation

## 2018-06-06 DIAGNOSIS — M1990 Unspecified osteoarthritis, unspecified site: Secondary | ICD-10-CM

## 2018-06-06 DIAGNOSIS — E1122 Type 2 diabetes mellitus with diabetic chronic kidney disease: Secondary | ICD-10-CM | POA: Insufficient documentation

## 2018-06-06 DIAGNOSIS — Z9889 Other specified postprocedural states: Secondary | ICD-10-CM | POA: Insufficient documentation

## 2018-06-06 DIAGNOSIS — M2550 Pain in unspecified joint: Secondary | ICD-10-CM

## 2018-06-06 DIAGNOSIS — N183 Chronic kidney disease, stage 3 unspecified: Secondary | ICD-10-CM

## 2018-06-06 DIAGNOSIS — E1149 Type 2 diabetes mellitus with other diabetic neurological complication: Secondary | ICD-10-CM

## 2018-06-06 DIAGNOSIS — R0789 Other chest pain: Secondary | ICD-10-CM

## 2018-06-06 DIAGNOSIS — I10 Essential (primary) hypertension: Secondary | ICD-10-CM

## 2018-06-06 DIAGNOSIS — E1169 Type 2 diabetes mellitus with other specified complication: Secondary | ICD-10-CM

## 2018-06-06 LAB — GLUCOSE, POCT (MANUAL RESULT ENTRY): POC GLUCOSE: 161 mg/dL — AB (ref 70–99)

## 2018-06-06 LAB — POCT GLYCOSYLATED HEMOGLOBIN (HGB A1C): HbA1c, POC (controlled diabetic range): 6.6 % (ref 0.0–7.0)

## 2018-06-06 MED ORDER — ATORVASTATIN CALCIUM 20 MG PO TABS: 20.0000 mg | ORAL_TABLET | Freq: Every day | ORAL | 3 refills | Status: DC

## 2018-06-06 MED ORDER — DICLOFENAC SODIUM 1 % TD GEL: 4.0000 g | Freq: Four times a day (QID) | TRANSDERMAL | 3 refills | Status: DC

## 2018-06-06 MED ORDER — AMLODIPINE BESYLATE 10 MG PO TABS: 10.0000 mg | ORAL_TABLET | Freq: Every day | ORAL | 3 refills | Status: DC

## 2018-06-06 MED ORDER — GLIPIZIDE 5 MG PO TABS: 2.5000 mg | ORAL_TABLET | Freq: Every day | ORAL | 3 refills | Status: DC

## 2018-06-06 NOTE — Progress Notes (Signed)
Patient is having pain in back, shoulders, and knees.  Patient is also having chest pains.

## 2018-06-06 NOTE — Patient Instructions (Signed)

## 2018-06-06 NOTE — Progress Notes (Signed)
Subjective:  Patient ID: Edward [name], male    DOB: 1971-08-05  Age: 47 y.o. MRN: [id number]  CC: Diabetes   HPI Edward [name] is a 46 year old male with a history of hypertension, type 2 diabetes mellitus (A1c 6.6), Graves' disease (status post radioactive iodine treatment in 07/2017), stage III chronic kidney disease, erectile dysfunction who presents today for a follow-up visit. His A1c  Is 6.6 which has trended up from 5.6 previously and he admits to non compliance with a diabetic diet. His neuropathy is controlled despite not using Gabapentin and he denies visual concerns. His blood pressure is elevated and he  Is yet to take his antihypertensives but informs me he had been taking it prior to today. He complains of chest pain which has been intermittent but absent now. Has been severe and located in his parasternal region for the last few weeks with no radiation of pain and no associated diaphoresis,  nausea, vomiting. He has  joint pains and cramping of his  Hands and lower back with no  radiation of pain.Prescribed voltaren gel in the past however he does not like using medications. He has had no recent Gout flares.  Past Medical History:  Diagnosis Date  . Anemia   . Arthritis   . Chronic kidney disease    stage 5  . Diabetes mellitus   . Graves disease 2014  . Headache   . Hypertension   . Non-compliance   . Shortness of breath dyspnea     Past Surgical History:  Procedure Laterality Date  . CIRCUMCISION     as a child, around 26 years of age  . EYE SURGERY    . PARS PLANA VITRECTOMY Right 03/25/2016   Procedure: PARS PLANA VITRECTOMY 25 GAUGE FOR ENDOPHTHALMITIS;  Surgeon: Jalene Mullet, MD;  Location: [facility];  Service: Ophthalmology;  Laterality: Right;  . WISDOM TOOTH EXTRACTION      No Known Allergies   Outpatient Medications Prior to Visit  Medication Sig Dispense Refill  . acetaminophen (TYLENOL) 500 MG tablet Take 1 tablet (500 mg total) by  mouth every 6 (six) hours as needed. 30 tablet 0  . metoprolol succinate (TOPROL-XL) 25 MG 24 hr tablet Take 1 tablet (25 mg total) by mouth daily. 45 tablet 3  . amLODipine (NORVASC) 10 MG tablet Take 1 tablet (10 mg total) by mouth daily. 30 tablet 3  . diclofenac sodium (VOLTAREN) 1 % GEL Apply 4 g topically 4 (four) times daily. (Patient not taking: Reported on 03/06/2018) 100 g 3  . fluticasone (FLONASE) 50 MCG/ACT nasal spray Place 2 sprays into both nostrils daily. (Patient not taking: Reported on 06/06/2018) 16 g 0  . phenol (CHLORASEPTIC) 1.4 % LIQD Use as directed 1 spray in the mouth or throat as needed for throat irritation / pain. (Patient not taking: Reported on 06/06/2018) 29 mL 0  . predniSONE (DELTASONE) 20 MG tablet Take 1 tablet (20 mg total) by mouth daily with breakfast. (Patient not taking: Reported on 06/06/2018) 5 tablet 0  . atorvastatin (LIPITOR) 20 MG tablet Take 1 tablet (20 mg total) by mouth daily. (Patient not taking: Reported on 03/06/2018) 30 tablet 3   No facility-administered medications prior to visit.     ROS Review of Systems  Constitutional: Negative for activity change and appetite change.  HENT: Negative for sinus pressure and sore throat.   Eyes: Negative for visual disturbance.  Respiratory: Negative for cough, chest tightness and shortness of breath.  Cardiovascular: Positive for chest pain. Negative for leg swelling.  Gastrointestinal: Negative for abdominal distention, abdominal pain, constipation and diarrhea.  Endocrine: Negative.   Genitourinary: Negative for dysuria.  Musculoskeletal: Positive for arthralgias. Negative for joint swelling and myalgias.  Skin: Negative for rash.  Allergic/Immunologic: Negative.   Neurological: Negative for weakness, light-headedness and numbness.  Psychiatric/Behavioral: Negative for dysphoric mood and suicidal ideas.    Objective:  BP (!) 174/106   Pulse 75   Temp 98.4 F (36.9 C) (Oral)   Ht 6' (1.829 m)    Wt 234 lb 12.8 oz (106.5 kg)   SpO2 100%   BMI 31.84 kg/m   BP/Weight 06/06/2018 03/31/4764 01/06/5034  Systolic BP 465 681 275  Diastolic BP 170 017 96  Wt. (Lbs) 234.8 - 220.4  BMI 31.84 - 29.89      Physical Exam  Constitutional: He is oriented to person, place, and time. He appears well-developed and well-nourished.  Cardiovascular: Normal rate, normal heart sounds and intact distal pulses.  No murmur heard. Pulmonary/Chest: Effort normal and breath sounds normal. He has no wheezes. He has no rales. He exhibits no tenderness.  Abdominal: Soft. Bowel sounds are normal. He exhibits no distension and no mass. There is no tenderness.  Musculoskeletal: Normal range of motion.  No swelling of joints noted. FROM in all joints No back tenderness,negative straight leg raise  Neurological: He is alert and oriented to person, place, and time.  Skin: Skin is warm and dry.  Psychiatric: He has a normal mood and affect.    CMP Latest Ref Rng & Units 03/21/2018 03/06/2018 11/05/2017  Glucose 65 - 99 mg/dL 206(H) 118(H) 124(H)  BUN 6 - 20 mg/dL 32(H) 43(H) 40(H)  Creatinine 0.61 - 1.24 mg/dL 3.67(H) 3.82(H) 3.14(H)  Sodium 135 - 145 mmol/L 139 144 140  Potassium 3.5 - 5.1 mmol/L 3.7 5.3(H) 4.7  Chloride 101 - 111 mmol/L 108 113(H) 111  CO2 22 - 32 mmol/L 22 17(L) 20(L)  Calcium 8.9 - 10.3 mg/dL 8.6(L) 8.7 8.8(L)  Total Protein 6.5 - 8.1 g/dL 8.1 6.9 -  Total Bilirubin 0.3 - 1.2 mg/dL 0.6 0.2 -  Alkaline Phos 38 - 126 U/L 95 121(H) -  AST 15 - 41 U/L 43(H) 30 -  ALT 17 - 63 U/L 37 30 -    Lipid Panel     Component Value Date/Time   CHOL 193 09/19/2016 1023   TRIG 187 (H) 09/19/2016 1023   HDL 68 09/19/2016 1023   CHOLHDL 2.8 09/19/2016 1023   VLDL 37 (H) 09/19/2016 1023   LDLCALC 88 09/19/2016 1023    Lab Results  Component Value Date   HGBA1C 6.6 06/06/2018    Assessment & Plan:   1. Type 2 diabetes mellitus with other specified complication, without long-term current use of  insulin (HCC) Controlled with A1c of 6.6 but this has trended up from 5.6previously Commence low dose Glipizide. Counseled on Diabetic diet, my plate method, 494 minutes of moderate intensity exercise/week Keep blood sugar logs with fasting goals of 80-120 mg/dl, random of less than 180 and in the event of sugars less than 60 mg/dl or greater than 400 mg/dl please notify the clinic ASAP. It is recommended that you undergo annual eye exams and annual foot exams. Pneumonia vaccine is recommended. - POCT glucose (manual entry) - POCT glycosylated hemoglobin (Hb A1C) - Lipid panel; Future - Microalbumin/Creatinine Ratio, Urine; Future  2. Uncontrolled hypertension Due to non compliance Compliance emphasized and complications of non  compliance discussed. Counseled on blood pressure goal of less than 130/80, low-sodium, DASH diet, medication compliance, 150 minutes of moderate intensity exercise per week. Discussed medication compliance, adverse effects. - amLODipine (NORVASC) 10 MG tablet; Take 1 tablet (10 mg total) by mouth daily.  Dispense: 30 tablet; Refill: 3  3. Type 2 diabetes mellitus with other neurologic complication, without long-term current use of insulin (HCC) Stable He has not needed Gabapentin - atorvastatin (LIPITOR) 20 MG tablet; Take 1 tablet (20 mg total) by mouth daily.  Dispense: 30 tablet; Refill: 3  4. Stage 3 chronic kidney disease (HCC) Combination of  Hypertensive, diabetic nephropathy Avoid nephrotoxins Followed by [facility]  5. Other chest pain EKG reveals T wave  Changes in lateral leads High  rish patient due to Diabetes and uncontrolled Hypertension - Ambulatory referral to Cardiology  6. Arthralgia, unspecified joint Prescribed Voltaren gel in the past which he has  Not been using   Meds ordered this encounter  Medications  . glipiZIDE (GLUCOTROL) 5 MG tablet    Sig: Take 0.5 tablets (2.5 mg total) by mouth daily before breakfast.     Dispense:  30 tablet    Refill:  3  . amLODipine (NORVASC) 10 MG tablet    Sig: Take 1 tablet (10 mg total) by mouth daily.    Dispense:  30 tablet    Refill:  3  . atorvastatin (LIPITOR) 20 MG tablet    Sig: Take 1 tablet (20 mg total) by mouth daily.    Dispense:  30 tablet    Refill:  3    Follow-up: Return in about 3 months (around 09/05/2018) for follow up of Medical conditions.   [name] MD

## 2018-06-07 ENCOUNTER — Other Ambulatory Visit: Payer: Self-pay

## 2018-06-11 ENCOUNTER — Ambulatory Visit: Payer: Self-pay | Attending: Family Medicine

## 2018-06-11 DIAGNOSIS — E1169 Type 2 diabetes mellitus with other specified complication: Secondary | ICD-10-CM | POA: Insufficient documentation

## 2018-06-11 NOTE — Progress Notes (Signed)
Patient her for lab visit only 

## 2018-06-12 LAB — LIPID PANEL
CHOLESTEROL TOTAL: 242 mg/dL — AB (ref 100–199)
Chol/HDL Ratio: 2.9 ratio (ref 0.0–5.0)
HDL: 84 mg/dL (ref >39)
LDL CALC: 137 mg/dL — AB (ref 0–99)
Triglycerides: 107 mg/dL (ref 0–149)
VLDL CHOLESTEROL CAL: 21 mg/dL (ref 5–40)

## 2018-06-12 LAB — MICROALBUMIN / CREATININE URINE RATIO
Creatinine, Urine: 104.2 mg/dL
Microalb/Creat Ratio: 1624.5 mg/g creat — ABNORMAL HIGH (ref 0.0–30.0)
Microalbumin, Urine: 1692.7 ug/mL

## 2018-06-25 MED FILL — ATORVASTATIN 20 MG TABLET: 20 | 30 days supply | Qty: 30 | Fill #0

## 2018-06-25 MED FILL — glipiZIDE 5 MG TABS: 5 | 30 days supply | Qty: 30 | Fill #0

## 2018-06-25 MED FILL — TORSEMIDE 100 MG TABLET: 100 | 30 days supply | Qty: 30 | Fill #0

## 2018-06-25 MED FILL — AMLODIPINE BESYLATE 10 MG T: 10 | 30 days supply | Qty: 30 | Fill #0

## 2018-06-25 MED FILL — DICLOFENAC SODIUM 1% GEL: 1 | 12 days supply | Qty: 100 | Fill #0

## 2018-08-20 ENCOUNTER — Encounter: Payer: Self-pay | Admitting: Cardiology

## 2018-08-20 ENCOUNTER — Ambulatory Visit (INDEPENDENT_AMBULATORY_CARE_PROVIDER_SITE_OTHER): Payer: Self-pay | Admitting: Cardiology

## 2018-08-20 VITALS — BP 195/120 | HR 82 | Ht 72.0 in | Wt 249.0 lb

## 2018-08-20 DIAGNOSIS — Z79899 Other long term (current) drug therapy: Secondary | ICD-10-CM | POA: Diagnosis not present

## 2018-08-20 DIAGNOSIS — N184 Chronic kidney disease, stage 4 (severe): Secondary | ICD-10-CM

## 2018-08-20 DIAGNOSIS — R609 Edema, unspecified: Secondary | ICD-10-CM

## 2018-08-20 DIAGNOSIS — R5383 Other fatigue: Secondary | ICD-10-CM

## 2018-08-20 DIAGNOSIS — I10 Essential (primary) hypertension: Secondary | ICD-10-CM

## 2018-08-20 DIAGNOSIS — Z7189 Other specified counseling: Secondary | ICD-10-CM

## 2018-08-20 DIAGNOSIS — R079 Chest pain, unspecified: Secondary | ICD-10-CM

## 2018-08-20 NOTE — Patient Instructions (Signed)
Medication Instructions:  Stop: Metoprolol 25 mg Restart: Amlodpine 10 mg daily If you need a refill on your cardiac medications before your next appointment, please call your pharmacy.   Lab work: Your physician recommends that you return for lab work today ( BMP, TSH)  If you have labs (blood work) drawn today and your tests are completely normal, you will receive your results only by: Marland Kitchen MyChart Message (if you have MyChart) OR . A paper copy in the mail If you have any lab test that is abnormal or we need to change your treatment, we will call you to review the results.  Testing/Procedures: Your physician has requested that you have an echocardiogram. Echocardiography is a painless test that uses sound waves to create images of your heart. It provides your doctor with information about the size and shape of your heart and how well your heart's chambers and valves are working. This procedure takes approximately one hour. There are no restrictions for this procedure. [facility] has requested that you have a lexiscan myoview. For further information please visit [email or web address]. Please follow instruction sheet, as given. [place] [address]    Follow-Up: At [facility], you and your health needs are our priority.  As part of our continuing mission to provide you with exceptional heart care, we have created designated Provider Care Teams.  These Care Teams include your primary Cardiologist (physician) and Advanced Practice Providers (APPs -  Physician Assistants and Nurse Practitioners) who all work together to provide you with the care you need, when you need it. You will need a follow up appointment in 2 months.  Please call our office 2 months in advance to schedule this appointment.  You may see Buford Dresser, MD or one of the following Advanced Practice Providers on your designated Care Team:   Rosaria Ferries,  PA-C . Jory Sims, DNP, ANP  Your physician recommends that you schedule a follow-up appointment in 3-4 weeks with Hypertension Clinic.   Any Other Special Instructions Will Be Listed Below (If Applicable).

## 2018-08-20 NOTE — Progress Notes (Signed)
Cardiology Office Note:    Date:  08/20/2018   ID:  Edward [name], DOB [birth year], MRN [id number]  PCP:  [name], MD  Cardiologist:  Buford Dresser, MD PhD  Referring MD: [name], MD   CC: chest pain  History of Present Illness:    Edward [name] is a 47 y.o. male with a hx of hypertension, type II diabetes with neuropathy, graves disease s/p radioactive iodine, chronic kidney disease stage 3, erectile dysfunction who is seen as a new consult at the request of [name], MD for the evaluation and management of chest pain.  Denies any personal cardiac history. No history of stress tests. Does endorse chest pain.  Chest pain: -Initial onset: several months ago. -Quality: cramping sensation over left chest, just sits there. Worse with movement. No radiation. Does get dizzy, lightheaded, shortness of breath, nauseated with it. -Frequency: every other day or so, sometimes several times/day. -Duration: about 1 minute -Triggers: no clear. Maybe with stress -Aggravating/alleviating factors: gets better if he sits still -Prior cardiac history: none -Prior ECG:  -Prior workup: none -Prior treatment: none -Alcohol: used to drink a lot, now rarely.  -Tobacco: occasional, 1 every three days or less. Former [place], last several months ago. No heroin or cocaine.  -Comorbidities: type II diabetes, kidney disease, hypertension. -Exercise level: extreme joint pain, especially back pain. Very limited in activity due to herniated discs. -Labs: TSH , kidney function/electrolytes , CBC . -Cardiac ROS: no shortness of breath, no PND, no orthopnea, no LE edema, no syncope -Family history: mother passed away from MI around age 5. Older sister had a heart issue. Father died when he was very young, unknown history.   Doesn't check BP at home. Knows that BP runs high, hasn't taken his medications in about a week. Does still follow with [facility], has appointment  coming up.  Notes LE edema, 30 lb weight gain in the last three months. Can't lie flat due to back pain. He has also noticed significant fatigue recently.  Discussed how I can help adherence. He knows what he needs to do but has trouble with medication. Feels drowsy on medication, doesn't want to feel that way around his kids.   Current meds (not taking) -amlodipine 10 mg -atorvastatin 20 mg -metoprolol succinate 25 mg  -torsemide 100 mg daily  Prior: Clonidine (once) Furosemide (once) Hydrochlorothiazide  Notes some trouble with his potassium fluctuating in the past. Has pending appointment with urology due to blood in the urine.   Past Medical History:  Diagnosis Date  . Anemia   . Arthritis   . Chronic kidney disease    stage 5  . Diabetes mellitus   . Graves disease 2014  . Headache   . Hypertension   . Non-compliance   . Shortness of breath dyspnea     Past Surgical History:  Procedure Laterality Date  . CIRCUMCISION     as a child, around 73 years of age  . EYE SURGERY    . PARS PLANA VITRECTOMY Right 03/25/2016   Procedure: PARS PLANA VITRECTOMY 25 GAUGE FOR ENDOPHTHALMITIS;  Surgeon: Jalene Mullet, MD;  Location: [facility];  Service: Ophthalmology;  Laterality: Right;  . WISDOM TOOTH EXTRACTION      Current Medications: Current Outpatient Medications on File Prior to Visit  Medication Sig  . acetaminophen (TYLENOL) 500 MG tablet Take 1 tablet (500 mg total) by mouth every 6 (six) hours as needed.  Marland Kitchen amLODipine (NORVASC) 10 MG tablet Take  1 tablet (10 mg total) by mouth daily.  Marland Kitchen atorvastatin (LIPITOR) 20 MG tablet Take 1 tablet (20 mg total) by mouth daily.  . diclofenac sodium (VOLTAREN) 1 % GEL Apply 4 g topically 4 (four) times daily.  . fluticasone (FLONASE) 50 MCG/ACT nasal spray Place 2 sprays into both nostrils daily.  Marland Kitchen glipiZIDE (GLUCOTROL) 5 MG tablet Take 0.5 tablets (2.5 mg total) by mouth daily before breakfast.  . metoprolol succinate (TOPROL-XL)  25 MG 24 hr tablet Take 1 tablet (25 mg total) by mouth daily.  . phenol (CHLORASEPTIC) 1.4 % LIQD Use as directed 1 spray in the mouth or throat as needed for throat irritation / pain.  . predniSONE (DELTASONE) 20 MG tablet Take 1 tablet (20 mg total) by mouth daily with breakfast.  . torsemide (DEMADEX) 100 MG tablet Take 100 mg by mouth daily.   No current facility-administered medications on file prior to visit.      Allergies:   Patient has no known allergies.   Social History   Socioeconomic History  . Marital status: Married    Spouse name: Not on file  . Number of children: Not on file  . Years of education: Not on file  . Highest education level: Not on file  Occupational History  . Not on file  Social Needs  . Financial resource strain: Not on file  . Food insecurity:    Worry: Not on file    Inability: Not on file  . Transportation needs:    Medical: Not on file    Non-medical: Not on file  Tobacco Use  . Smoking status: Former Smoker    Packs/day: 0.75    Years: 3.00    Pack years: 2.25    Types: Cigarettes    Last attempt to quit: 05/28/2000    Years since quitting: 18.2  . Smokeless tobacco: Never Used  Substance and Sexual Activity  . Alcohol use: Yes    Alcohol/week: 0.0 standard drinks    Comment: 1 beer every night  . Drug use: No  . Sexual activity: Not on file  Lifestyle  . Physical activity:    Days per week: Not on file    Minutes per session: Not on file  . Stress: Not on file  Relationships  . Social connections:    Talks on phone: Not on file    Gets together: Not on file    Attends religious service: Not on file    Active member of club or organization: Not on file    Attends meetings of clubs or organizations: Not on file    Relationship status: Not on file  Other Topics Concern  . Not on file  Social History Narrative  . Not on file     Family History: The patient's family history includes Diabetes in his mother; Hypertension  in his mother.  ROS:   Please see the history of present illness.  Additional pertinent ROS:  Constitutional: Negative for chills, fever, night sweats, unintentional weight loss  HENT: Negative for ear pain and hearing loss.   Eyes: Negative for loss of vision and eye pain.  Respiratory: Negative for cough, sputum, shortness of breath, wheezing.   Cardiovascular: Positive for chest pain. Negative for palpitations, PND, orthopnea, lower extremity edema and claudication.  Gastrointestinal: Negative for abdominal pain, melena, and hematochezia.  Genitourinary: Negative for dysuria and hematuria.  Musculoskeletal: Negative for falls and myalgias.Positive for joint pain.  Skin: Negative for itching and rash.  Neurological:  Negative for focal weakness, focal sensory changes and loss of consciousness.  Endo/Heme/Allergies: Does not bruise/bleed easily.    EKGs/Labs/Other Studies Reviewed:    The following studies were reviewed today: Echo 05/12/15 Study Conclusions  - Left ventricle: The cavity size was normal. Wall thickness was   increased in a pattern of mild LVH. Systolic function was normal.   The estimated ejection fraction was in the range of 50% to 55%.   Wall motion was normal; there were no regional wall motion   abnormalities. - Mitral valve: There was mild regurgitation.  EKG:  EKG is personally reviewed.  The ekg ordered today demonstrates normal sinus rhythm, poor R wave progression, nonspecific T wave flattening  Recent Labs: 03/21/2018: ALT 37; Hemoglobin 11.2; Platelets 220 08/20/2018: BUN 36; Creatinine, Ser 4.01; Potassium 4.2; Sodium 143; TSH 86.600  Recent Lipid Panel    Component Value Date/Time   CHOL 242 (H) 06/11/2018 0854   TRIG 107 06/11/2018 0854   HDL 84 06/11/2018 0854   CHOLHDL 2.9 06/11/2018 0854   CHOLHDL 2.8 09/19/2016 1023   VLDL 37 (H) 09/19/2016 1023   LDLCALC 137 (H) 06/11/2018 0854    Physical Exam:    VS:  BP (!) 195/120 (BP Location:  Right Arm, Patient Position: Sitting, Cuff Size: Large)   Pulse 82   Ht 6' (1.829 m)   Wt 249 lb (112.9 kg)   BMI 33.77 kg/m    BP recheck unchanged  Wt Readings from Last 3 Encounters:  08/20/18 249 lb (112.9 kg)  06/06/18 234 lb 12.8 oz (106.5 kg)  03/06/18 220 lb 6.4 oz (100 kg)     GEN: Well nourished, well developed in no acute distress HEENT: Normal NECK: No JVD; No carotid bruits LYMPHATICS: No lymphadenopathy CARDIAC: regular rhythm, normal S1 and S2, no murmurs, rubs, gallops. Radial and DP pulses 2+ bilaterally. RESPIRATORY:  Clear to auscultation without rales, wheezing or rhonchi  ABDOMEN: Soft, non-tender, non-distended MUSCULOSKELETAL:  No edema; No deformity  SKIN: Warm and dry NEUROLOGIC:  Alert and oriented x 3 PSYCHIATRIC:  Normal affect   ASSESSMENT:    1. Chest pain, unspecified type   2. Edema, unspecified type   3. Medication management   4. Essential hypertension   5. Counseling on health promotion and disease prevention   6. CKD (chronic kidney disease) stage 4, GFR 15-29 ml/min (HCC)   7. Fatigue, unspecified type    PLAN:    Chest pain: multiple risk factors -cannot treadmill due to joint pain. Will order lexiscan myoview -counseled on red flag warning signs that warrant immediate medical attention  Edema, fatigue: multiple risk factors for either systolic or diastolic heart failure. May also be due to his chronic kidney disease. Stage 4 today with GFR of 19. -echocardiogram -BMP, TSH--->creatinine elevated, TSH very elevated at 86 today. Will contact patient, ask him to see his PCP ASAP to evaluate for hypothyroidism and initiate treatment.  Hypertension: discussed extensively. He does not have any current symptoms that warrant admission/ER, but we discussed at length that both his kidneys and heart are damaged by uncontrolled blood pressure.  -stop metoprolol given fatigue -restarting amlodipine -with his worsening CKD, will defer ACEI/ARB;  would like these given diabetes and CKD, but worsening is concerning. Spironolactone also not ideal with worsening renal function. If BP still elevated on amlodipine (which I expect), may need to use hydralazine, though this is not ideal with adherence concerns -I will have him see the [facility] HTN clinic in the  near future for further management, and then I will see him again at 2 mos.  Prevention and health counseling -recommend heart healthy/Mediterranean diet, with whole grains, fruits, vegetable, fish, lean meats, nuts, and olive oil. Limit salt. -recommend moderate walking, 3-5 times/week for 30-50 minutes each session. Aim for at least 150 minutes.week. Goal should be pace of 3 miles/hours, or walking 1.5 miles in 30 minutes -recommend avoidance of tobacco products. Avoid excess alcohol. -Additional risk factor control:  -Diabetes: A1c is 5.6  -Lipids: LDL 137, above goal. Given adherence concerns, will start back one medication at a time. Should be on moderate/ high intensity (preferably high) statin given his risk  -Blood pressure control: as above  -Weight: BMI 33.8 -ASCVD risk score: The 10-year ASCVD risk score Mikey Bussing DC Brooke Bonito., et al., 2013) is: 26.2%   Values used to calculate the score:     Age: 89 years     Sex: Male     Is Non-Hispanic African American: Yes     Diabetic: Yes     Tobacco smoker: No     Systolic Blood Pressure: 093 mmHg     Is BP treated: Yes     HDL Cholesterol: 84 mg/dL     Total Cholesterol: 242 mg/dL   Plan for follow up: [facility] HTN clinic in 3-4 weeks, then me in 2 mos  Medication Adjustments/Labs and Tests Ordered: Current medicines are reviewed at length with the patient today.  Concerns regarding medicines are outlined above.  Orders Placed This Encounter  Procedures  . TSH  . Basic metabolic panel  . MYOCARDIAL PERFUSION IMAGING  . EKG 12-Lead  . ECHOCARDIOGRAM COMPLETE   No orders of the defined types were placed in this encounter.   Patient  Instructions  Medication Instructions:  Stop: Metoprolol 25 mg Restart: Amlodpine 10 mg daily If you need a refill on your cardiac medications before your next appointment, please call your pharmacy.   Lab work: Your physician recommends that you return for lab work today ( BMP, TSH)  If you have labs (blood work) drawn today and your tests are completely normal, you will receive your results only by: Marland Kitchen MyChart Message (if you have MyChart) OR . A paper copy in the mail If you have any lab test that is abnormal or we need to change your treatment, we will call you to review the results.  Testing/Procedures: Your physician has requested that you have an echocardiogram. Echocardiography is a painless test that uses sound waves to create images of your heart. It provides your doctor with information about the size and shape of your heart and how well your heart's chambers and valves are working. This procedure takes approximately one hour. There are no restrictions for this procedure. [place] has requested that you have a lexiscan myoview. For further information please visit HugeFiesta.[email or web address]. Please follow instruction sheet, as given. [place] [address]    Follow-Up: At [facility], you and your health needs are our priority.  As part of our continuing mission to provide you with exceptional heart care, we have created designated Provider Care Teams.  These Care Teams include your primary Cardiologist (physician) and Advanced Practice Providers (APPs -  Physician Assistants and Nurse Practitioners) who all work together to provide you with the care you need, when you need it. You will need a follow up appointment in 2 months.  Please call our office 2 months in advance  to schedule this appointment.  You may see Buford Dresser, MD or one of the following Advanced Practice Providers on your designated Care Team:   Rosaria Ferries,  PA-C . Jory Sims, DNP, ANP  Your physician recommends that you schedule a follow-up appointment in 3-4 weeks with Hypertension Clinic.   Any Other Special Instructions Will Be Listed Below (If Applicable).       Signed, Buford Dresser, MD PhD 08/20/2018 1:58 PM    [facility]

## 2018-08-21 ENCOUNTER — Encounter: Payer: Self-pay | Admitting: Cardiology

## 2018-08-21 LAB — BASIC METABOLIC PANEL
BUN/Creatinine Ratio: 9 (ref 9–20)
BUN: 36 mg/dL — AB (ref 6–24)
CALCIUM: 8.8 mg/dL (ref 8.7–10.2)
CO2: 20 mmol/L (ref 20–29)
Chloride: 107 mmol/L — ABNORMAL HIGH (ref 96–106)
Creatinine, Ser: 4.01 mg/dL — ABNORMAL HIGH (ref 0.76–1.27)
GFR, EST AFRICAN AMERICAN: 19 mL/min/{1.73_m2} — AB (ref >59)
GFR, EST NON AFRICAN AMERICAN: 17 mL/min/{1.73_m2} — AB (ref >59)
Glucose: 140 mg/dL — ABNORMAL HIGH (ref 65–99)
POTASSIUM: 4.2 mmol/L (ref 3.5–5.2)
Sodium: 143 mmol/L (ref 134–144)

## 2018-08-21 LAB — TSH: TSH: 86.6 u[IU]/mL — ABNORMAL HIGH (ref 0.450–4.500)

## 2018-09-03 ENCOUNTER — Telehealth (HOSPITAL_COMMUNITY): Payer: Self-pay | Admitting: *Deleted

## 2018-09-03 NOTE — Telephone Encounter (Signed)
Patient's wife wasgiven detailed instructions per Myocardial Perfusion Study Information Sheet for the test on 09/05/18 at 1030. Patient notified to arrive 15 minutes early and that it is imperative to arrive on time for appointment to keep from having the test rescheduled.  If you need to cancel or reschedule your appointment, please call the office within 24 hours of your appointment. . Patient verbalized understanding.Ariauna Farabee, Ranae Palms

## 2018-09-03 NOTE — Telephone Encounter (Signed)
Left message on voicemail per DPR in reference to upcoming appointment scheduled on 09/05/18 at 59 with detailed instructions given per Myocardial Perfusion Study Information Sheet for the test. LM to arrive 15 minutes early, and that it is imperative to arrive on time for appointment to keep from having the test rescheduled. If you need to cancel or reschedule your appointment, please call the office within 24 hours of your appointment. Failure to do so may result in a cancellation of your appointment, and a $50 no show fee. Phone number given for call back for any questions. Zyion Doxtater, Ranae Palms

## 2018-09-05 ENCOUNTER — Ambulatory Visit (HOSPITAL_BASED_OUTPATIENT_CLINIC_OR_DEPARTMENT_OTHER): Payer: Medicare Other

## 2018-09-05 ENCOUNTER — Ambulatory Visit (HOSPITAL_COMMUNITY): Payer: Medicare Other | Attending: Cardiology

## 2018-09-05 ENCOUNTER — Other Ambulatory Visit: Payer: Self-pay

## 2018-09-05 ENCOUNTER — Ambulatory Visit: Payer: Self-pay | Admitting: Family Medicine

## 2018-09-05 ENCOUNTER — Encounter: Payer: Medicare Other | Admitting: *Deleted

## 2018-09-05 VITALS — Ht 72.0 in | Wt 249.0 lb

## 2018-09-05 DIAGNOSIS — R079 Chest pain, unspecified: Secondary | ICD-10-CM | POA: Insufficient documentation

## 2018-09-05 DIAGNOSIS — R609 Edema, unspecified: Secondary | ICD-10-CM | POA: Diagnosis not present

## 2018-09-05 DIAGNOSIS — I340 Nonrheumatic mitral (valve) insufficiency: Secondary | ICD-10-CM | POA: Insufficient documentation

## 2018-09-05 DIAGNOSIS — Z006 Encounter for examination for normal comparison and control in clinical research program: Secondary | ICD-10-CM

## 2018-09-05 LAB — MYOCARDIAL PERFUSION IMAGING
CHL CUP NUCLEAR SDS: 2
CHL CUP NUCLEAR SRS: 1
CHL CUP NUCLEAR SSS: 3
LV dias vol: 149 mL (ref 62–150)
LVSYSVOL: 90 mL
NUC STRESS TID: 1.05
Peak HR: 100 {beats}/min
Rest HR: 73 {beats}/min

## 2018-09-05 MED ORDER — TECHNETIUM TC 99M TETROFOSMIN IV KIT
32.4000 | PACK | Freq: Once | INTRAVENOUS | Status: AC | PRN
  Administered 2018-09-05: 32.4 via INTRAVENOUS
  Filled 2018-09-05: qty 33

## 2018-09-05 MED ORDER — TECHNETIUM TC 99M TETROFOSMIN IV KIT
11.0000 | PACK | Freq: Once | INTRAVENOUS | Status: AC | PRN
  Administered 2018-09-05: 11 via INTRAVENOUS
  Filled 2018-09-05: qty 11

## 2018-09-05 MED ORDER — REGADENOSON 0.4 MG/5ML IV SOLN
0.4000 mg | Freq: Once | INTRAVENOUS | Status: AC
  Administered 2018-09-05: 0.4 mg via INTRAVENOUS

## 2018-09-05 NOTE — Research (Signed)
CADFEM Informed Consent                  Subject Name:   Edward [name]. Rizor   Subject met inclusion and exclusion criteria.  The informed consent form, study requirements and expectations were reviewed with the subject and questions and concerns were addressed prior to the signing of the consent form.  The subject verbalized understanding of the trial requirements.  The subject agreed to participate in the CADFEM trial and signed the informed consent.  The informed consent was obtained prior to performance of any protocol-specific procedures for the subject.  A copy of the signed informed consent was given to the subject and a copy was placed in the subject's medical record.   Edward [name], Research Assistant 09/05/2018  08:55 a.m.

## 2018-09-07 DIAGNOSIS — Z9114 Patient's other noncompliance with medication regimen: Secondary | ICD-10-CM | POA: Diagnosis not present

## 2018-09-07 DIAGNOSIS — N189 Chronic kidney disease, unspecified: Secondary | ICD-10-CM | POA: Diagnosis not present

## 2018-09-07 DIAGNOSIS — R3129 Other microscopic hematuria: Secondary | ICD-10-CM | POA: Diagnosis not present

## 2018-09-07 DIAGNOSIS — N2581 Secondary hyperparathyroidism of renal origin: Secondary | ICD-10-CM | POA: Diagnosis not present

## 2018-09-07 DIAGNOSIS — N184 Chronic kidney disease, stage 4 (severe): Secondary | ICD-10-CM | POA: Diagnosis not present

## 2018-09-07 DIAGNOSIS — I129 Hypertensive chronic kidney disease with stage 1 through stage 4 chronic kidney disease, or unspecified chronic kidney disease: Secondary | ICD-10-CM | POA: Diagnosis not present

## 2018-09-07 DIAGNOSIS — D631 Anemia in chronic kidney disease: Secondary | ICD-10-CM | POA: Diagnosis not present

## 2018-09-10 ENCOUNTER — Encounter: Payer: Self-pay | Admitting: Pharmacist Clinician (PhC)/ Clinical Pharmacy Specialist

## 2018-09-10 ENCOUNTER — Ambulatory Visit (INDEPENDENT_AMBULATORY_CARE_PROVIDER_SITE_OTHER): Payer: Medicare Other | Admitting: Pharmacist Clinician (PhC)/ Clinical Pharmacy Specialist

## 2018-09-10 DIAGNOSIS — I10 Essential (primary) hypertension: Secondary | ICD-10-CM | POA: Diagnosis not present

## 2018-09-10 MED ORDER — CARVEDILOL 3.125 MG PO TABS: 3.1250 mg | ORAL_TABLET | Freq: Two times a day (BID) | ORAL | 3 refills | Status: DC

## 2018-09-10 NOTE — Assessment & Plan Note (Addendum)
Patient with uncontrolled hypertension, doing better today since restarting amlodipine.  We had a long discussion about compliance and what that means.  Explained that he may fell better without his meds, but within a year he will probably be on dialysis and have heart failure from the uncontrolled hypertension.  He is to continue with the amlodipine and atorvastatin each night as well as torsemide and glipizide each morning.   Will add carvedilol 3.125 mg twice daily.  Explained that he may feel a little tired from this, but usually it's a transient effect.  Also explained the benefit because of his low EF.  I don't think he will be able to tolerate a high enough dose to control BP, and we will probably need to add hydralazine.  Will see him back in 3 weeks for follow up.    Also advised patient to follow up with his endocrinologist regarding his elevated TSH.  Will forward his most recent labs to her and he is to call her today for follow up.

## 2018-09-10 NOTE — Progress Notes (Signed)
09/10/2018 Edward [name] 1971/09/03 [id number]   HPI:  Edward [name] is a 47 y.o. male patient of Dr Harrell Gave, with a [place] below who presents today for hypertension clinic evaluation.  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  He was originally seen in cardiology for chest pain, however a stress test earlier this month showed no cardiac cause of this pain.   Today patient is in for hypertension management.  He notes that the chest pain continues daily, in various areas of his chest cavity and never lasting more than just a few minutes.  Other than this, he states he tends to feel tired and fatigued when he takes his medications, and better on days he doesn't take them.  He admits to non-compliance with meds, stating his misses all of them at least 1-2 times per week.   When he saw Dr. Harrell Gave he had not been taking any of his medications so she asked him to start back on amlodipine, but discontinued his spironolactone and metoprolol.     Blood Pressure Goal:  130/80  Current Medications:  Amlodipine 10 mg daily - misses 1-2 times per week  Torsemide 100 mg daily - misses 1-2 times per week  Family Hx:  Father died when infant, mother when 14.    One full sister also with hypertension, DM, now 31  Social Hx:  Smokes, 1 ppw - mini cigars; has cut way back on alcohol, 1 beer/week these days at most; rarely drinks caffeine unless he is feeling under the weather  Diet:  Not much appetite recently, only grazing - admits to missing meals daily, although he cooks for his kids daily  Exercise:  No regular exercise - since feet swelling Home BP readings:  Intolerances:   Avoid ACEI/ARB, spironolactone and thiazides due to current kidney function.  Would like to use low dose ACE due to DM, but will monitor SCr for stability for awhile.    Labs:  08/2018:   Na 143, K 4.2, Glu 140, BUN 36, SCr 4.01       TSH 88.6  Wt Readings from Last 3 Encounters:  09/10/18 251 lb (113.9 kg)  09/05/18 249 lb (112.9 kg)  08/20/18 249 lb (112.9 kg)   BP Readings from Last 3 Encounters:  09/10/18 (!) 160/106  08/20/18 (!) 195/120  06/06/18 (!) 174/106   Pulse Readings from Last 3 Encounters:  09/10/18 76  08/20/18 82  06/06/18 75    Current Outpatient Medications  Medication Sig Dispense Refill  . acetaminophen (TYLENOL) 500 MG tablet Take 1 tablet (500 mg total) by mouth every 6 (six) hours as needed. 30 tablet 0  . amLODipine (NORVASC) 10 MG tablet Take 1 tablet (10 mg total) by mouth daily. 30 tablet 3  . atorvastatin (LIPITOR) 20 MG tablet Take 1 tablet (20 mg total) by mouth daily. 30 tablet 3  . carvedilol (COREG) 3.125 MG tablet Take 1 tablet (3.125 mg total) by mouth 2 (two) times daily. 180 tablet 3  . diclofenac sodium (VOLTAREN) 1 % GEL Apply 4 g topically 4 (four) times daily. 100 g 3  . fluticasone (FLONASE) 50 MCG/ACT nasal spray Place 2 sprays into both nostrils daily. 16 g 0  . glipiZIDE (GLUCOTROL) 5 MG tablet Take 0.5 tablets (2.5 mg total) by mouth daily before breakfast. 30 tablet 3  . phenol (CHLORASEPTIC) 1.4 % LIQD  Use as directed 1 spray in the mouth or throat as needed for throat irritation / pain. 29 mL 0  . predniSONE (DELTASONE) 20 MG tablet Take 1 tablet (20 mg total) by mouth daily with breakfast. 5 tablet 0  . torsemide (DEMADEX) 100 MG tablet Take 100 mg by mouth daily.  6   No current facility-administered medications for this visit.     No Known Allergies  Past Medical History:  Diagnosis Date  . Anemia   . Arthritis   . Chronic kidney disease    stage 5  . Diabetes mellitus   . Graves disease 2014  . Headache   . Hypertension   . Non-compliance   . Shortness of breath dyspnea     Blood pressure (!) 160/106, pulse 76, height 6' (1.829 m), weight 251 lb (113.9 kg).  Uncontrolled hypertension Patient  with uncontrolled hypertension, doing better today since restarting amlodipine.  We had a long discussion about compliance and what that means.  Explained that he may fell better without his meds, but within a year he will probably be on dialysis and have heart failure from the uncontrolled hypertension.  He is to continue with the amlodipine and atorvastatin each night as well as torsemide and glipizide each morning.   Will add carvedilol 3.125 mg twice daily.  Explained that he may feel a little tired from this, but usually it's a transient effect.  Also explained the benefit because of his low EF.  I don't think he will be able to tolerate a high enough dose to control BP, and we will probably need to add hydralazine.  Will see him back in 3 weeks for follow up.    Also advised patient to follow up with his endocrinologist regarding his elevated TSH.  Will forward his most recent labs to her and he is to call her today for follow up.    Tommy Medal PharmD CPP [facility] [address] [place], [place] [address] [phone]

## 2018-09-10 NOTE — Patient Instructions (Signed)
Return for a a follow up appointment in 3 weeks. If you have any problems with your medications please call Agam Davenport/Raquel at [phone]  Your blood pressure today is 160/106  Take your BP meds as follows:  AM:  Torsemide, glipizide and carvedilol  PM:  Amlodipine, atorvastatin and carvedilol  Bring all of your meds, your BP cuff and your record of home blood pressures to your next appointment.  Exercise as you're able, try to walk approximately 30 minutes per day.  Keep salt intake to a minimum, especially watch canned and prepared boxed foods.  Eat more fresh fruits and vegetables and fewer canned items.  Avoid eating in fast food restaurants.    HOW TO TAKE YOUR BLOOD PRESSURE: . Rest 5 minutes before taking your blood pressure. .  Don't smoke or drink caffeinated beverages for at least 30 minutes before. . Take your blood pressure before (not after) you eat. . Sit comfortably with your back supported and both feet on the floor (don't cross your legs). . Elevate your arm to heart level on a table or a desk. . Use the proper sized cuff. It should fit smoothly and snugly around your bare upper arm. There should be enough room to slip a fingertip under the cuff. The bottom edge of the cuff should be 1 inch above the crease of the elbow. . Ideally, take 3 measurements at one sitting and record the average.

## 2018-09-14 ENCOUNTER — Ambulatory Visit (INDEPENDENT_AMBULATORY_CARE_PROVIDER_SITE_OTHER): Payer: Medicare Other | Admitting: Internal Medicine

## 2018-09-14 ENCOUNTER — Encounter: Payer: Self-pay | Admitting: Internal Medicine

## 2018-09-14 VITALS — BP 150/90 | HR 85 | Ht 72.0 in | Wt 245.0 lb

## 2018-09-14 DIAGNOSIS — R635 Abnormal weight gain: Secondary | ICD-10-CM

## 2018-09-14 DIAGNOSIS — Z8639 Personal history of other endocrine, nutritional and metabolic disease: Secondary | ICD-10-CM | POA: Diagnosis not present

## 2018-09-14 DIAGNOSIS — E890 Postprocedural hypothyroidism: Secondary | ICD-10-CM

## 2018-09-14 MED ORDER — LEVOTHYROXINE SODIUM 88 MCG PO TABS: 88.0000 ug | ORAL_TABLET | Freq: Every day | ORAL | 3 refills | Status: DC

## 2018-09-14 MED FILL — LEVOTHYROXINE 88 MCG TABLET: 88 | 30 days supply | Qty: 30 | Fill #0

## 2018-09-14 NOTE — Progress Notes (Signed)
Patient ID: Edward [name], male   DOB: 04-14-1971, 47 y.o.   MRN: [id number]   HPI  Edward [name] is a 47 y.o.-year-old male, initially referred by his PCP, Dr. Feliciana Rossetti, returning for f/u for Graves ds, now with post ablative hypothyroidism, developed since last visit. Last visit 1 year ago.  He was lost for follow-up afterwards  Reviewed and addended history: Pt with h/o thyrotoxicosis since at least 2014 and was on methimazole but he was noncompliant over the years with the dosing, therefore, at last visit, I suggested RAI tx.  Since last visit, he had a Thyroid Uptake and Scan (05/24/2017): 1. Elevated 24 hour radioactive iodine uptake equal to 36.9%. 2. Heterogeneous tracer activity within both lobes of the thyroid gland suggesting multinodular goiter.  He had RAI Tx (06/23/2017).  I was contacted by patient's cardiologist in 08/2018 that he had a very high TSH at the visit with her.   Reviewed patient's TFTs: Lab Results  Component Value Date   TSH 86.600 (H) 08/20/2018   TSH <0.01 (L) 08/10/2017   TSH <0.01 (L) 05/12/2017   TSH <0.01 (L) 02/21/2017   TSH 0.04 (L) 09/20/2016   TSH 0.03 (L) 05/31/2016   TSH 0.05 (L) 04/11/2016   TSH 0.07 (L) 02/26/2016   TSH 0.01 (L) 01/08/2016   TSH 0.018 (L) 11/25/2015   FREET4 2.92 (H) 08/10/2017   FREET4 1.69 (H) 05/12/2017   FREET4 1.48 02/21/2017   FREET4 1.50 09/20/2016   FREET4 1.24 05/31/2016   FREET4 1.14 04/11/2016   FREET4 1.97 (H) 02/26/2016   FREET4 2.1 (H) 01/08/2016   FREET4 1.27 05/29/2015    His TSI's were elevated: Lab Results  Component Value Date   TSI 463 (H) 02/26/2016   Pt denies: - feeling nodules in neck - hoarseness - dysphagia - choking - SOB with lying down  Pt does not have a FH of thyroid ds. No FH of thyroid cancer. No h/o radiation tx to head or neck except for RAI treatment.  No seaweed or kelp. No recent contrast studies. No herbal supplements. No Biotin use. No recent steroids use.    He has a history of DM2, CKD secondary to diabetes, back pain, OA.  His HbA1c increased recently: Lab Results  Component Value Date   HGBA1C 6.6 06/06/2018   He had several eye surgeries - OU DR. 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  ROS: Constitutional: + Weight gain/no weight loss, + fatigue, no subjective hyperthermia, + subjective hypothermia Eyes: + blurry vision, no xerophthalmia ENT: no sore throat, + see HPI Cardiovascular: + CP/+ SOB/no palpitations/+ leg swelling Respiratory: + cough/+ SOB/+ wheezing Gastrointestinal: + N/no V/+ D/+ C/no acid reflux Musculoskeletal: no muscle aches/no joint aches Skin + rash, no hair loss Neurological: no tremors/no numbness/no tingling/no dizziness, + HA  +diff. With erections  I reviewed pt's medications, allergies, PMH, social hx, family hx, and changes were documented in the history of present illness. Otherwise, unchanged from my initial visit note.  Past Medical History:  Diagnosis Date  . Anemia   . Arthritis   . Chronic kidney disease    stage 5  . Diabetes mellitus   . Graves disease 2014  . Headache   . Hypertension   . Non-compliance   . Shortness of breath dyspnea    Past Surgical History:  Procedure Laterality Date  . CIRCUMCISION     as a child, around 8 years of age  . EYE SURGERY    .  PARS PLANA VITRECTOMY Right 03/25/2016   Procedure: PARS PLANA VITRECTOMY 25 GAUGE FOR ENDOPHTHALMITIS;  Surgeon: Jalene Mullet, MD;  Location: [facility];  Service: Ophthalmology;  Laterality: Right;  . WISDOM TOOTH EXTRACTION     Social History   Social History  . Marital Status: Married    Spouse Name: N/A  . Number of Children: 5: 25-4 y/o (2017)   Occupational History  . Chef-cook   Social History Main Topics  . Smoking status: Former Smoker -- 0.75 packs/day for 3 years    Types: Cigarettes    Quit date: 2004  . Smokeless tobacco: Not on file  . Alcohol Use:      Comment: 1 beer every night  . Drug  Use: No   Current Outpatient Medications on File Prior to Visit  Medication Sig Dispense Refill  . amLODipine (NORVASC) 10 MG tablet Take 1 tablet (10 mg total) by mouth daily. 30 tablet 3  . atorvastatin (LIPITOR) 20 MG tablet Take 1 tablet (20 mg total) by mouth daily. 30 tablet 3  . carvedilol (COREG) 3.125 MG tablet Take 1 tablet (3.125 mg total) by mouth 2 (two) times daily. 180 tablet 3  . glipiZIDE (GLUCOTROL) 5 MG tablet Take 0.5 tablets (2.5 mg total) by mouth daily before breakfast. 30 tablet 3  . torsemide (DEMADEX) 100 MG tablet Take 100 mg by mouth daily.  6   No current facility-administered medications on file prior to visit.    No Known Allergies Family History  Problem Relation Age of Onset  . Hypertension Mother   . Diabetes Mother    PE: There were no vitals taken for this visit.  Wt Readings from Last 3 Encounters:  09/10/18 251 lb (113.9 kg)  09/05/18 249 lb (112.9 kg)  08/20/18 249 lb (112.9 kg)   Constitutional: overweight, in NAD Eyes: PERRLA, EOMI, + exophthalmos, no lid lag, no stare ENT: moist mucous membranes, + slight left thyromegaly, no cervical lymphadenopathy Cardiovascular: RRR, No MRG Respiratory: CTA B Gastrointestinal: abdomen soft, NT, ND, BS+ Musculoskeletal: no deformities, strength intact in all 4 Skin: moist, warm, no rashes Neurological: no tremor with outstretched hands, DTR normal in all 4  ASSESSMENT: 1. Postablative hypothyroidism  2. H/o Graves ds - thyrotoxicosis since at least 2014  3.  Weight gain  Patient needs to be called with results  PLAN:  1.  Patient with post ablative hypothyroidism after RAI treatment for Graves' disease 1 year and 3 months ago.  Thyroid tests checked after RAI treatment were still in the thyrotoxic range so we started atenolol at last visit.  However, after our last visit a year ago he was lost for follow-up.  Recent TFTs checked by PCP showed severe hypothyroidism, with a TSH very high, at  86.6.  I explained the risks of uncontrolled hypothyroidism to include immediate complications like fatigue, weight gain, cold intolerance, depression, weakness, constipation, dry skin, hair loss, but also long-term hypertension, CHF, dementia. -At this visit, we discussed about the fact that he developed hypothyroidism which can be responsible of his fatigue, weight gain, worsening hypertension and diabetes -Discussed about treatment of hypothyroidism with thyroid hormones -At today's visit, will start levothyroxine at 88 mcg daily and recheck his tests in 5 weeks.  At that time, I anticipate that we will need to increase the dose further -We discussed about how to take levothyroxine correctly: Daily, without skipping doses, in a.m., before breakfast, with water, separated by at least 30 minutes from breakfast and by  at least 4 hours from multivitamins, iron, calcium, acid reflux medications. -I will have him return in 3 months but in 5-6 weeks for labs.  2.  History of Graves' disease -This was diagnosed in 2014 -He had positive TSI antibodies and also a thyroid uptake and scan consistent with hyperthyroidism -He had RAI treatment 06/2017 -He was still thyrotoxic 2 months after his RAI treatment and we added atenolol.  He was lost for follow-up afterwards. -At this visit, he has no Graves' ophthalmopathy, also he has some exophthalmos.  No lid lag, no stare -He has slight left thyromegaly but no neck compression symptoms  3.  Weight gain -Patient gained 35 pounds since last visit, most likely due to uncontrolled hypothyroidism -I explained that treating his hypothyroidism will most likely improve his weight  Orders Placed This Encounter  Procedures  . TSH  . T4, free    Philemon Kingdom, MD PhD [facility]

## 2018-09-14 NOTE — Patient Instructions (Addendum)
Please start: - Levothyroxine 88 mcg daily  Take the thyroid hormone every day, with water, at least 30 minutes before breakfast, separated by at least 4 hours from: - acid reflux medications - calcium - iron - multivitamins  Please come back for labs in 5-6 weeks.  Please come back for a follow-up appointment in 3-4 months.

## 2018-09-19 MED FILL — AMLODIPINE BESYLATE 10 MG T: 10 | 30 days supply | Qty: 30 | Fill #0

## 2018-09-19 MED FILL — TORSEMIDE 100 MG TABLET: 100 | 30 days supply | Qty: 30 | Fill #0

## 2018-09-19 MED FILL — CARVEDILOL 3.125 MG TABLET: 3.125 | 30 days supply | Qty: 60 | Fill #0

## 2018-09-19 MED FILL — CALCITRIOL 0.5 MCG CAPS: 0.5 | 30 days supply | Qty: 30 | Fill #0

## 2018-09-21 ENCOUNTER — Other Ambulatory Visit (HOSPITAL_COMMUNITY): Payer: Self-pay

## 2018-09-24 ENCOUNTER — Ambulatory Visit (HOSPITAL_COMMUNITY)
Admission: RE | Admit: 2018-09-24 | Discharge: 2018-09-24 | Disposition: A | Discharge status: Home / Self Care | Payer: Medicare Other | Source: Ambulatory Visit | Attending: Nephrology | Admitting: Nephrology

## 2018-09-24 VITALS — BP 177/105 | HR 85 | Temp 98.6°F

## 2018-09-24 DIAGNOSIS — N179 Acute kidney failure, unspecified: Secondary | ICD-10-CM

## 2018-09-24 DIAGNOSIS — N189 Chronic kidney disease, unspecified: Secondary | ICD-10-CM | POA: Diagnosis not present

## 2018-09-24 DIAGNOSIS — N184 Chronic kidney disease, stage 4 (severe): Secondary | ICD-10-CM | POA: Diagnosis not present

## 2018-09-24 LAB — POCT HEMOGLOBIN-HEMACUE: Hemoglobin: 9.7 g/dL — ABNORMAL LOW (ref 13.0–17.0)

## 2018-09-24 MED ORDER — EPOETIN ALFA-EPBX 10000 UNIT/ML IJ SOLN
10000.0000 [IU] | INTRAMUSCULAR | Status: DC
  Administered 2018-09-24: 10000 [IU] via SUBCUTANEOUS
  Filled 2018-09-24: qty 1

## 2018-09-24 NOTE — Discharge Instructions (Signed)
Epoetin Alfa injection °What is this medicine? °EPOETIN ALFA (e POE e tin AL fa) helps your body make more red blood cells. This medicine is used to treat anemia caused by chronic kidney disease, cancer chemotherapy, or HIV-therapy. It may also be used before surgery if you have anemia. °This medicine may be used for other purposes; ask your health care provider or pharmacist if you have questions. °COMMON BRAND NAME(S): Epogen, Procrit, Retacrit °What should I tell my health care provider before I take this medicine? °They need to know if you have any of these conditions: °-cancer °-heart disease °-high blood pressure °-history of blood clots °-history of stroke °-low levels of folate, iron, or vitamin B12 in the blood °-seizures °-an unusual or allergic reaction to erythropoietin, albumin, benzyl alcohol, hamster proteins, other medicines, foods, dyes, or preservatives °-pregnant or trying to get pregnant °-breast-feeding °How should I use this medicine? °This medicine is for injection into a vein or under the skin. It is usually given by a health care professional in a hospital or clinic setting. °If you get this medicine at home, you will be taught how to prepare and give this medicine. Use exactly as directed. Take your medicine at regular intervals. Do not take your medicine more often than directed. °It is important that you put your used needles and syringes in a special sharps container. Do not put them in a trash can. If you do not have a sharps container, call your pharmacist or healthcare provider to get one. °A special MedGuide will be given to you by the pharmacist with each prescription and refill. Be sure to read this information carefully each time. °Talk to your pediatrician regarding the use of this medicine in children. While this drug may be prescribed for selected conditions, precautions do apply. °Overdosage: If you think you have taken too much of this medicine contact a poison control center  or emergency room at once. °NOTE: This medicine is only for you. Do not share this medicine with others. °What if I miss a dose? °If you miss a dose, take it as soon as you can. If it is almost time for your next dose, take only that dose. Do not take double or extra doses. °What may interact with this medicine? °Interactions have not been studied. °This list may not describe all possible interactions. Give your health care provider a list of all the medicines, herbs, non-prescription drugs, or dietary supplements you use. Also tell them if you smoke, drink alcohol, or use illegal drugs. Some items may interact with your medicine. °What should I watch for while using this medicine? °Your condition will be monitored carefully while you are receiving this medicine. °You may need blood work done while you are taking this medicine. °This medicine may cause a decrease in vitamin B6. You should make sure that you get enough vitamin B6 while you are taking this medicine. Discuss the foods you eat and the vitamins you take with your health care professional. °What side effects may I notice from receiving this medicine? °Side effects that you should report to your doctor or health care professional as soon as possible: °-allergic reactions like skin rash, itching or hives, swelling of the face, lips, or tongue °-seizures °-signs and symptoms of a blood clot such as breathing problems; changes in vision; chest pain; severe, sudden headache; pain, swelling, warmth in the leg; trouble speaking; sudden numbness or weakness of the face, arm or leg °-signs and symptoms of a stroke   like changes in vision; confusion; trouble speaking or understanding; severe headaches; sudden numbness or weakness of the face, arm or leg; trouble walking; dizziness; loss of balance or coordination °Side effects that usually do not require medical attention (report to your doctor or health care professional if they continue or are  bothersome): °-chills °-cough °-dizziness °-fever °-headaches °-joint pain °-muscle cramps °-muscle pain °-nausea, vomiting °-pain, redness, or irritation at site where injected °This list may not describe all possible side effects. Call your doctor for medical advice about side effects. You may report side effects to FDA at 1-800-FDA-1088. °Where should I keep my medicine? °Keep out of the reach of children. °Store in a refrigerator between 2 and 8 degrees C (36 and 46 degrees F). Do not freeze or shake. Throw away any unused portion if using a single-dose vial. Multi-dose vials can be kept in the refrigerator for up to 21 days after the initial dose. Throw away unused medicine. °NOTE: This sheet is a summary. It may not cover all possible information. If you have questions about this medicine, talk to your doctor, pharmacist, or health care provider. °© 2019 [employer] (2017-04-28 08:35:19) ° °

## 2018-10-02 ENCOUNTER — Ambulatory Visit: Payer: Medicare Other

## 2018-10-02 NOTE — Progress Notes (Deleted)
10/02/2018 Edward [name]. October 21, 1970 [id number]   HPI:  Edward [name]. is a 47 y.o. male patient of Dr Harrell Gave, with a [place] below who presents today for hypertension clinic evaluation.  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  He was originally seen in cardiology for chest pain, however a stress test earlier this month showed no cardiac cause of this pain.  At his first visit with Dr. Harrell Gave his metabolic panel showed a GFR of 19 (SCr of 4.01), fasting glucose of 140 and TSH at 86.6.  He had been non-compliant with most medications, and because of his worsening CKD, she had him stop spironolactone but re-start his amlodipine and torsemide.  When he was seen in [facility] 3 weeks ago we again had a discussion about his complaints, most of which are likely due to the hypothyroidism.  It was stressed that he should follow up with Dr. Cruzita Lederer as soon as possible.   I added carvedilol 3.125 mg bid, which will be beneficial to both his hypertension and low EF.  Hopefully as his thyroid function returns toward normal and the symptoms abate, we will be able to increase this dose.  He returns today for follow up.      Blood Pressure Goal:  130/80  Current Medications:  Amlodipine 10 mg daily - misses 1-2 times per week  Torsemide 100 mg daily - misses 1-2 times per week  Family Hx:  Father died when infant, mother when 12.    One full sister also with hypertension, DM, now 51  Social Hx:  Smokes, 1 ppw - mini cigars; has cut way back on alcohol, 1 beer/week these days at most; rarely drinks caffeine unless he is feeling under the weather  Diet:  Not much appetite recently, only grazing - admits to missing meals daily, although he cooks for his kids daily  Exercise:  No regular exercise - since feet swelling Home BP readings:  Intolerances:   Avoid  ACEI/ARB, spironolactone and thiazides due to current kidney function.  Would like to use low dose ACE due to DM, but will monitor SCr for stability for awhile.    Labs:  08/2018:  Na 143, K 4.2, Glu 140, BUN 36, SCr 4.01       TSH 88.6  Wt Readings from Last 3 Encounters:  09/14/18 245 lb (111.1 kg)  09/10/18 251 lb (113.9 kg)  09/05/18 249 lb (112.9 kg)   BP Readings from Last 3 Encounters:  09/24/18 (!) 177/105  09/14/18 (!) 150/90  09/10/18 (!) 160/106   Pulse Readings from Last 3 Encounters:  09/24/18 85  09/14/18 85  09/10/18 76    Current Outpatient Medications  Medication Sig Dispense Refill  . amLODipine (NORVASC) 10 MG tablet Take 1 tablet (10 mg total) by mouth daily. 30 tablet 3  . atorvastatin (LIPITOR) 20 MG tablet Take 1 tablet (20 mg total) by mouth daily. 30 tablet 3  . carvedilol (COREG) 3.125 MG tablet Take 1 tablet (3.125 mg total) by mouth 2 (two) times daily. 180 tablet 3  . glipiZIDE (GLUCOTROL) 5 MG tablet Take 0.5 tablets (2.5 mg total) by mouth daily before breakfast. 30 tablet 3  . levothyroxine (SYNTHROID, LEVOTHROID) 88 MCG tablet Take 1 tablet (88 mcg total) by mouth daily. 45 tablet 3  . torsemide (DEMADEX) 100 MG tablet Take 100 mg by mouth daily.  6   No current facility-administered medications for this visit.     No Known Allergies  Past Medical History:  Diagnosis Date  . Anemia   . Arthritis   . Chronic kidney disease    stage 5  . Diabetes mellitus   . Graves disease 2014  . Headache   . Hypertension   . Non-compliance   . Shortness of breath dyspnea     There were no vitals taken for this visit.  No problem-specific Assessment & Plan notes found for this encounter.   Tommy Medal PharmD CPP [facility] [address] [place], [place] [address] [phone]

## 2018-10-04 ENCOUNTER — Ambulatory Visit: Payer: Self-pay | Admitting: Family Medicine

## 2018-10-09 ENCOUNTER — Other Ambulatory Visit: Payer: Self-pay

## 2018-10-09 DIAGNOSIS — N184 Chronic kidney disease, stage 4 (severe): Secondary | ICD-10-CM

## 2018-10-11 ENCOUNTER — Ambulatory Visit (INDEPENDENT_AMBULATORY_CARE_PROVIDER_SITE_OTHER): Payer: Medicare Other | Admitting: Pharmacist

## 2018-10-11 VITALS — BP 146/86 | HR 72 | Ht 72.0 in | Wt 229.4 lb

## 2018-10-11 DIAGNOSIS — I10 Essential (primary) hypertension: Secondary | ICD-10-CM

## 2018-10-11 NOTE — Patient Instructions (Addendum)
Return for a  follow up appointment in 4 weeks with Dr Harrell Gave  Check your blood pressure at home daily (if able) and keep record of the readings.  Take your BP meds as follows: *CONTINUE ALL MEDICATION WITHOUT CHANGES*   Bring all of your meds, your BP cuff and your record of home blood pressures to your next appointment.  Exercise as you're able, try to walk approximately 30 minutes per day.  Keep salt intake to a minimum, especially watch canned and prepared boxed foods.  Eat more fresh fruits and vegetables and fewer canned items.  Avoid eating in fast food restaurants.    HOW TO TAKE YOUR BLOOD PRESSURE: . Rest 5 minutes before taking your blood pressure. .  Don't smoke or drink caffeinated beverages for at least 30 minutes before. . Take your blood pressure before (not after) you eat. . Sit comfortably with your back supported and both feet on the floor (don't cross your legs). . Elevate your arm to heart level on a table or a desk. . Use the proper sized cuff. It should fit smoothly and snugly around your bare upper arm. There should be enough room to slip a fingertip under the cuff. The bottom edge of the cuff should be 1 inch above the crease of the elbow. . Ideally, take 3 measurements at one sitting and record the average.

## 2018-10-11 NOTE — Progress Notes (Signed)
HPI:  Edward SUGG Sr. is a 48 y.o. male patient of Dr Harrell Gave, with a [place] below who presents today for hypertension clinic follow up.  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  He was originally seen in cardiology for chest pain, however a stress test earlier this month showed no cardiac cause of this pain.   Patient states he tends to feel tired and fatigued when he takes his medications, and better on days he doesn't take them.  He admits to non-compliance with meds, stating his misses all of them at least 1-2 times per week.   When he saw Dr. Harrell Gave he had not been taking any of his medications so she asked him to start back on amlodipine, but discontinued his spironolactone and metoprolol.  During last OV carvedilol 3.125mg  twice daily was added to his regimen.   [name] presents to clinic today and reports recent flu symptoms, stopped taking medication for 2 weeks, then resume about 4 days ago. Lightheadedness,   Blood Pressure Goal:  130/80  Current Medications:  Amlodipine 10 mg in evening  Carvedilol 3.125mg  twice daily  Torsemide 100 mg daily   Family Hx:  Father died when infant, mother when 41.    One full sister also with hypertension, DM, now 66  Social Hx:  Smokes, 1 ppw - mini cigars; has cut way back on alcohol, 1 beer/week these days at most; rarely drinks caffeine unless he is feeling under the weather  Diet:  Not much appetite recently, only grazing - admits to missing meals daily, although he cooks for his kids daily  Exercise:  No regular exercise - since feet swelling  Home BP readings:  Intolerances:   Avoid ACEI/ARB, spironolactone and thiazides due to current kidney function.  Would like to use low dose ACE due to DM, but will monitor SCr for stability for awhile.    Labs:  08/2018:  Na 143, K 4.2, Glu 140, BUN 36,  SCr 4.01;  TSH 88.6  Wt Readings from Last 3 Encounters:  10/11/18 229 lb 6.4 oz (104.1 kg)  09/14/18 245 lb (111.1 kg)  09/10/18 251 lb (113.9 kg)   BP Readings from Last 3 Encounters:  10/11/18 (!) 146/86  09/24/18 (!) 177/105  09/14/18 (!) 150/90   Pulse Readings from Last 3 Encounters:  10/11/18 72  09/24/18 85  09/14/18 85    Current Outpatient Medications  Medication Sig Dispense Refill  . amLODipine (NORVASC) 10 MG tablet Take 1 tablet (10 mg total) by mouth daily. 30 tablet 3  . atorvastatin (LIPITOR) 20 MG tablet Take 1 tablet (20 mg total) by mouth daily. 30 tablet 3  . carvedilol (COREG) 3.125 MG tablet Take 1 tablet (3.125 mg total) by mouth 2 (two) times daily. 180 tablet 3  . glipiZIDE (GLUCOTROL) 5 MG tablet Take 0.5 tablets (2.5 mg total) by mouth daily before breakfast. 30 tablet 3  . levothyroxine (SYNTHROID, LEVOTHROID) 88 MCG tablet Take 1 tablet (88 mcg total) by mouth daily. 45 tablet 3  . torsemide (DEMADEX) 100 MG tablet Take 100 mg by mouth daily.  6   No current facility-administered medications for this visit.     No Known Allergies  Past Medical History:  Diagnosis Date  . Anemia   . Arthritis   . Chronic kidney disease    stage 5  . Diabetes mellitus   .  Graves disease 2014  . Headache   . Hypertension   . Non-compliance   . Shortness of breath dyspnea     Blood pressure (!) 146/86, pulse 72, height 6' (1.829 m), weight 229 lb 6.4 oz (104.1 kg).  Uncontrolled hypertension BP remains above goal during office visit. Patient tolerating therapy as prescribed, but stopped taking all medication for 2 weeks while suffering from the flu. Just resume this therapy 4 days ago and reports significant improvement in low extremity edema.   Significant amount of time was spent taking about risk of uncontrolled BP. Will continue amlodipine, carvedilol and torsemide as prescribed. Follow up in 4 weeks with DR Harrell Gave.    Edward [name]  PharmD, BCPS, Edward [name] [id number] 10/17/2018 5:43 PM

## 2018-10-15 DIAGNOSIS — N5201 Erectile dysfunction due to arterial insufficiency: Secondary | ICD-10-CM | POA: Diagnosis not present

## 2018-10-17 ENCOUNTER — Encounter: Payer: Self-pay | Admitting: Pharmacist

## 2018-10-17 NOTE — Assessment & Plan Note (Signed)
BP remains above goal during office visit. Patient tolerating therapy as prescribed, but stopped taking all medication for 2 weeks while suffering from the flu. Just resume this therapy 4 days ago and reports significant improvement in low extremity edema.   Significant amount of time was spent taking about risk of uncontrolled BP. Will continue amlodipine, carvedilol and torsemide as prescribed. Follow up in 4 weeks with DR Harrell Gave.

## 2018-10-22 ENCOUNTER — Ambulatory Visit (HOSPITAL_COMMUNITY)
Admission: RE | Admit: 2018-10-22 | Discharge: 2018-10-22 | Disposition: A | Discharge status: Home / Self Care | Payer: Medicare Other | Source: Ambulatory Visit | Attending: Nephrology | Admitting: Nephrology

## 2018-10-22 ENCOUNTER — Other Ambulatory Visit (INDEPENDENT_AMBULATORY_CARE_PROVIDER_SITE_OTHER): Payer: Medicare Other

## 2018-10-22 VITALS — BP 173/110 | HR 78 | Temp 98.1°F | Resp 20

## 2018-10-22 DIAGNOSIS — E890 Postprocedural hypothyroidism: Secondary | ICD-10-CM | POA: Diagnosis not present

## 2018-10-22 DIAGNOSIS — N179 Acute kidney failure, unspecified: Secondary | ICD-10-CM | POA: Insufficient documentation

## 2018-10-22 DIAGNOSIS — N184 Chronic kidney disease, stage 4 (severe): Secondary | ICD-10-CM | POA: Diagnosis not present

## 2018-10-22 DIAGNOSIS — N189 Chronic kidney disease, unspecified: Secondary | ICD-10-CM | POA: Diagnosis not present

## 2018-10-22 LAB — IRON AND TIBC
Iron: 98 ug/dL (ref 45–182)
Saturation Ratios: 30 % (ref 17.9–39.5)
TIBC: 325 ug/dL (ref 250–450)
UIBC: 227 ug/dL

## 2018-10-22 LAB — POCT HEMOGLOBIN-HEMACUE: Hemoglobin: 9.9 g/dL — ABNORMAL LOW (ref 13.0–17.0)

## 2018-10-22 LAB — FERRITIN: Ferritin: 178 ng/mL (ref 24–336)

## 2018-10-22 LAB — T4, FREE: Free T4: 0.26 ng/dL — ABNORMAL LOW (ref 0.60–1.60)

## 2018-10-22 LAB — TSH: TSH: >49.1 u[IU]/mL — ABNORMAL HIGH (ref 0.35–4.50)

## 2018-10-22 MED ORDER — EPOETIN ALFA-EPBX 10000 UNIT/ML IJ SOLN
10000.0000 [IU] | INTRAMUSCULAR | Status: DC
  Administered 2018-10-22: 10000 [IU] via SUBCUTANEOUS
  Filled 2018-10-22: qty 1

## 2018-10-23 ENCOUNTER — Telehealth: Payer: Self-pay

## 2018-10-23 NOTE — Telephone Encounter (Signed)
Left message for patient to return our call at [phone].  

## 2018-10-23 NOTE — Telephone Encounter (Signed)
-----   Message from Philemon Kingdom, MD sent at 10/23/2018  8:41 AM EST ----- Lenna Sciara, can you please call pt: His thyroid tests are still very abnormal.  Per my records, we started levothyroxine 88 mcg daily.  Let us send a prescription for 112 mcg daily of levothyroxine (and take off the 88 mcg dose from his med list).  Please send this for 2 months.  I will recheck his thyroid test when he comes back for the appointment in March.  In the meantime, please make sure that he is taking this every single day, with water, at least 30 minutes before breakfast, separated by at least 4 hours from: - acid reflux medications - calcium - iron - multivitamins

## 2018-10-23 NOTE — Progress Notes (Addendum)
Cardiology Office Note:    Date:  10/24/2018   ID:  Edward [name], DOB 1971-06-12, MRN [id number]  PCP:  [name], MD  Cardiologist:  Buford Dresser, MD PhD  Referring MD: [name], MD   CC: follow up of hypertension  History of Present Illness:    Edward [name] is a 48 y.o. male with a hx of hypertension, type II diabetes with neuropathy, graves disease s/p radioactive iodine, chronic kidney disease stage 4, erectile dysfunction who is seen in follow up for hypertension and chest pain today. His initial consult was on 08/20/18.   Since last visit: had stress test and echo, reviewed today. No chest pain since. Recovering from Shortness of breath improved. Has trouble taking medications. Not taking consistently. Missed due to illness/visitors. Has not taken in several weeks, but was taking daily before he was sick. Feels swollen, thinks his weight is up. Going to restart medications today.   Past Medical History:  Diagnosis Date  . Anemia   . Arthritis   . Chronic kidney disease    stage 5  . Diabetes mellitus   . Graves disease 2014  . Headache   . Hypertension   . Non-compliance   . Shortness of breath dyspnea     Past Surgical History:  Procedure Laterality Date  . CIRCUMCISION     as a child, around 42 years of age  . EYE SURGERY    . PARS PLANA VITRECTOMY Right 03/25/2016   Procedure: PARS PLANA VITRECTOMY 25 GAUGE FOR ENDOPHTHALMITIS;  Surgeon: Jalene Mullet, MD;  Location: [facility];  Service: Ophthalmology;  Laterality: Right;  . WISDOM TOOTH EXTRACTION      Current Medications: Current Outpatient Medications on File Prior to Visit  Medication Sig  . amLODipine (NORVASC) 10 MG tablet Take 1 tablet (10 mg total) by mouth daily.  Marland Kitchen atorvastatin (LIPITOR) 20 MG tablet Take 1 tablet (20 mg total) by mouth daily.  . carvedilol (COREG) 3.125 MG tablet Take 1 tablet (3.125 mg total) by mouth 2 (two) times daily.  Marland Kitchen glipiZIDE  (GLUCOTROL) 5 MG tablet Take 0.5 tablets (2.5 mg total) by mouth daily before breakfast.  . levothyroxine (SYNTHROID, LEVOTHROID) 112 MCG tablet Take 1 tablet (112 mcg total) by mouth daily.  Marland Kitchen torsemide (DEMADEX) 100 MG tablet Take 100 mg by mouth daily.   No current facility-administered medications on file prior to visit.      Allergies:   Patient has no known allergies.   Social History   Socioeconomic History  . Marital status: Married    Spouse name: Not on file  . Number of children: Not on file  . Years of education: Not on file  . Highest education level: Not on file  Occupational History  . Not on file  Social Needs  . Financial resource strain: Not on file  . Food insecurity:    Worry: Not on file    Inability: Not on file  . Transportation needs:    Medical: Not on file    Non-medical: Not on file  Tobacco Use  . Smoking status: Former Smoker    Packs/day: 0.75    Years: 3.00    Pack years: 2.25    Types: Cigarettes    Last attempt to quit: 05/28/2000    Years since quitting: 18.4  . Smokeless tobacco: Never Used  Substance and Sexual Activity  . Alcohol use: Yes    Alcohol/week: 0.0 standard drinks    Comment:  1 beer every night  . Drug use: No  . Sexual activity: Not on file  Lifestyle  . Physical activity:    Days per week: Not on file    Minutes per session: Not on file  . Stress: Not on file  Relationships  . Social connections:    Talks on phone: Not on file    Gets together: Not on file    Attends religious service: Not on file    Active member of club or organization: Not on file    Attends meetings of clubs or organizations: Not on file    Relationship status: Not on file  Other Topics Concern  . Not on file  Social History Narrative  . Not on file     Family History: The patient's family history includes Diabetes in his mother; Hypertension in his mother. mother passed away from MI around age 65. Older sister had a heart issue. Father  died when he was very young, unknown history.   ROS:   Please see the history of present illness.  Additional pertinent ROS:  Constitutional: Negative for chills, fever, night sweats, unintentional weight loss  HENT: Negative for ear pain and hearing loss.   Eyes: Negative for loss of vision and eye pain.  Respiratory: Negative for cough, sputum, shortness of breath, wheezing.   Cardiovascular: Negative for palpitations, chest pain, PND, orthopnea, lower extremity edema and claudication.  Gastrointestinal: Negative for abdominal pain, melena, and hematochezia.  Genitourinary: Negative for dysuria and hematuria.  Musculoskeletal: Negative for falls and myalgias.Positive for joint pain.  Skin: Negative for itching and rash.  Neurological: Negative for focal weakness, focal sensory changes and loss of consciousness.  Endo/Heme/Allergies: Does not bruise/bleed easily.    EKGs/Labs/Other Studies Reviewed:    The following studies were reviewed today: Echo 09/05/18 Study Conclusions  - Left ventricle: The cavity size was normal. There was moderate   concentric hypertrophy. Systolic function was normal. The   estimated ejection fraction was in the range of 50% to 55%. Wall   motion was normal; there were no regional wall motion   abnormalities. Features are consistent with a pseudonormal left   ventricular filling pattern, with concomitant abnormal relaxation   and increased filling pressure (grade 2 diastolic dysfunction).   Doppler parameters are consistent with elevated ventricular   end-diastolic filling pressure. - Aortic valve: There was no regurgitation. - Mitral valve: There was moderate regurgitation. - Right ventricle: The cavity size was normal. Wall thickness was   normal. Systolic function was normal. - Right atrium: The atrium was normal in size. - Tricuspid valve: There was mild regurgitation. - Pulmonary arteries: Systolic pressure was within the normal   range. -  Inferior vena cava: The vessel was normal in size. - Pericardium, extracardiac: A trivial pericardial effusion was   identified posterior to the heart. Features were not consistent   with tamponade physiology.  Lexiscan 09/05/18  Nuclear stress EF: 40%.  There was no ST segment deviation noted during stress.  This is an intermediate risk study.  The left ventricular ejection fraction is moderately decreased (30-44%).   1. EF 40% with diffuse hypokinesis.  2. No evidence for ischemia or infarction on perfusion images.   Intermediate risk study due to abnormal LV systolic function.  Suspect nonischemic cardiomyopathy.   Echo 05/12/15 Study Conclusions  - Left ventricle: The cavity size was normal. Wall thickness was   increased in a pattern of mild LVH. Systolic function was normal.  The estimated ejection fraction was in the range of 50% to 55%.   Wall motion was normal; there were no regional wall motion   abnormalities. - Mitral valve: There was mild regurgitation.  EKG:  EKG is personally reviewed.  The ekg ordered previously demonstrates normal sinus rhythm, poor R wave progression, nonspecific T wave flattening  Recent Labs: 03/21/2018: ALT 37; Platelets 220 08/20/2018: BUN 36; Creatinine, Ser 4.01; Potassium 4.2; Sodium 143 10/22/2018: Hemoglobin 9.9; TSH >49.10  Recent Lipid Panel    Component Value Date/Time   CHOL 242 (H) 06/11/2018 0854   TRIG 107 06/11/2018 0854   HDL 84 06/11/2018 0854   CHOLHDL 2.9 06/11/2018 0854   CHOLHDL 2.8 09/19/2016 1023   VLDL 37 (H) 09/19/2016 1023   LDLCALC 137 (H) 06/11/2018 0854    Physical Exam:    VS:  BP (!) 172/108   Pulse 77   Ht 6' (1.829 m)   Wt 240 lb 12.8 oz (109.2 kg)   SpO2 99%   BMI 32.66 kg/m    BP recheck unchanged  Wt Readings from Last 3 Encounters:  10/24/18 240 lb 12.8 oz (109.2 kg)  10/11/18 229 lb 6.4 oz (104.1 kg)  09/14/18 245 lb (111.1 kg)     GEN: Well nourished, well developed in no acute  distress HEENT: Normal NECK: No JVD; No carotid bruits LYMPHATICS: No lymphadenopathy CARDIAC: regular rhythm, normal S1 and S2, no murmurs, rubs, gallops. Radial and DP pulses 2+ bilaterally. RESPIRATORY:  Clear to auscultation without rales, wheezing or rhonchi  ABDOMEN: Soft, non-tender, non-distended MUSCULOSKELETAL:  No edema; No deformity  SKIN: Warm and dry NEUROLOGIC:  Alert and oriented x 3 PSYCHIATRIC:  Normal affect   ASSESSMENT:    1. Uncontrolled hypertension   2. Stage 4 chronic kidney disease ([place])   3. Chronic diastolic heart failure ([place])   4. Counseling on health promotion and disease prevention    PLAN:    Hypertension: discussed extensively. Reviewed importance of medications to protect his heart, kidneys, and brain. He plans to restart today.  -restart amlodipine 10 mg, carvedilol 3.125 mg BID, torsemide 100 mg daily. Had fatigue on metoprolol prior.  -Defer ACEI/ARB, spironolactone to nephrology given progression of his CKD to stage 4 with prior labs -will have him restart meds, come back to [facility] clinic in 3-4 weeks for BP recheck. He had fatigue on metoprolol, but could try uptitration of carvedilol if not at goal. Do not suspect that hydralazine will be manageable due to dosing frequency, but he would benefit. He is pending evaluation for fistula/graft for dialysis.  Chest pain: resolved. No ischemia on lexiscan. Does have multiple risk factors  Diastolic heart failure: echo done, shows normal EF but grade 2 diastolic dysfunction. Euvolemic on exam. Needs improved BP control.   Prevention and health counseling -recommend heart healthy/Mediterranean diet, with whole grains, fruits, vegetable, fish, lean meats, nuts, and olive oil. Limit salt. -recommend moderate walking, 3-5 times/week for 30-50 minutes each session. Aim for at least 150 minutes.week. Goal should be pace of 3 miles/hours, or walking 1.5 miles in 30 minutes -recommend avoidance of tobacco  products. Avoid excess alcohol. -Additional risk factor control:  -Diabetes: A1c is 5.6  -Lipids: LDL 137, above goal. Given adherence concerns, will start back one medication at a time. Should be on moderate/ high intensity (preferably high) statin given his risk  -Blood pressure control: as above  -Weight: BMI 32.6 -ASCVD risk score: The 10-year ASCVD risk score Mikey Bussing DC Brooke Bonito., et  al., 2013) is: 21.2%   Values used to calculate the score:     Age: 89 years     Sex: Male     Is Non-Hispanic African American: Yes     Diabetic: Yes     Tobacco smoker: No     Systolic Blood Pressure: 540 mmHg     Is BP treated: Yes     HDL Cholesterol: 84 mg/dL     Total Cholesterol: 242 mg/dL   Plan for follow up: PharmD HTN clinic in 3-4 weeks, then me in 3 mos  TIME SPENT WITH PATIENT: >25 minutes of direct patient care. More than 50% of that time was spent on coordination of care and counseling regarding hypertension and importance of good control.  Buford Dresser, MD, PhD [place]  [facility]   Medication Adjustments/Labs and Tests Ordered: Current medicines are reviewed at length with the patient today.  Concerns regarding medicines are outlined above.  No orders of the defined types were placed in this encounter.  No orders of the defined types were placed in this encounter.   Patient Instructions  Medication Instructions:    NONE If you need a refill on your cardiac medications before your next appointment, please call your pharmacy.   Lab work:  NONE  If you have labs (blood work) drawn today and your tests are completely normal, you will receive your results only by: Marland Kitchen MyChart Message (if you have MyChart) OR . A paper copy in the mail If you have any lab test that is abnormal or we need to change your treatment, we will call you to review the results.  Testing/Procedures:   NONE  Follow-Up: At [facility], you and your health needs are our priority.  As  part of our continuing mission to provide you with exceptional heart care, we have created designated Provider Care Teams.  These Care Teams include your primary Cardiologist (physician) and Advanced Practice Providers (APPs -  Physician Assistants and Nurse Practitioners) who all work together to provide you with the care you need, when you need it. You will need a follow up appointment in 3 months.  Please call our office 2 months in advance to schedule this appointment.  You may see Buford Dresser, MD or one of the following Advanced Practice Providers on your designated Care Team:   Rosaria Ferries, PA-C . Jory Sims, DNP, ANP  Any Other Special Instructions Will Be Listed Below (If Applicable).   Schedule an appointment with Hypertension clinic in 3 weeks     Signed, Buford Dresser, MD PhD 10/24/2018 11:36 PM    [place]

## 2018-10-24 ENCOUNTER — Ambulatory Visit (INDEPENDENT_AMBULATORY_CARE_PROVIDER_SITE_OTHER): Payer: Medicare Other | Admitting: Cardiology

## 2018-10-24 ENCOUNTER — Encounter: Payer: Self-pay | Admitting: Cardiology

## 2018-10-24 VITALS — BP 172/108 | HR 77 | Ht 72.0 in | Wt 240.8 lb

## 2018-10-24 DIAGNOSIS — I5032 Chronic diastolic (congestive) heart failure: Secondary | ICD-10-CM | POA: Diagnosis not present

## 2018-10-24 DIAGNOSIS — I10 Essential (primary) hypertension: Secondary | ICD-10-CM

## 2018-10-24 DIAGNOSIS — Z7189 Other specified counseling: Secondary | ICD-10-CM

## 2018-10-24 DIAGNOSIS — N184 Chronic kidney disease, stage 4 (severe): Secondary | ICD-10-CM | POA: Diagnosis not present

## 2018-10-24 MED ORDER — LEVOTHYROXINE SODIUM 112 MCG PO TABS: 112.0000 ug | ORAL_TABLET | Freq: Every day | ORAL | 0 refills | Status: DC

## 2018-10-24 NOTE — Telephone Encounter (Signed)
Notified patient of message from Dr. Cruzita Lederer, patient expressed understanding and agreement. No further questions.  Medication sent and chart updated.

## 2018-10-24 NOTE — Patient Instructions (Signed)
Medication Instructions:    NONE If you need a refill on your cardiac medications before your next appointment, please call your pharmacy.   Lab work:  NONE  If you have labs (blood work) drawn today and your tests are completely normal, you will receive your results only by: Marland Kitchen MyChart Message (if you have MyChart) OR . A paper copy in the mail If you have any lab test that is abnormal or we need to change your treatment, we will call you to review the results.  Testing/Procedures:   NONE  Follow-Up: At [facility], you and your health needs are our priority.  As part of our continuing mission to provide you with exceptional heart care, we have created designated Provider Care Teams.  These Care Teams include your primary Cardiologist (physician) and Advanced Practice Providers (APPs -  Physician Assistants and Nurse Practitioners) who all work together to provide you with the care you need, when you need it. You will need a follow up appointment in 3 months.  Please call our office 2 months in advance to schedule this appointment.  You may see Buford Dresser, MD or one of the following Advanced Practice Providers on your designated Care Team:   Rosaria Ferries, PA-C . Jory Sims, DNP, ANP  Any Other Special Instructions Will Be Listed Below (If Applicable).   Schedule an appointment with Hypertension clinic in 3 weeks

## 2018-10-30 ENCOUNTER — Encounter: Payer: Self-pay | Admitting: Family Medicine

## 2018-10-30 ENCOUNTER — Ambulatory Visit: Payer: Medicare Other | Attending: Family Medicine | Admitting: Family Medicine

## 2018-10-30 VITALS — BP 164/94 | HR 74 | Temp 98.4°F | Ht 72.0 in | Wt 242.8 lb

## 2018-10-30 DIAGNOSIS — N529 Male erectile dysfunction, unspecified: Secondary | ICD-10-CM | POA: Diagnosis not present

## 2018-10-30 DIAGNOSIS — I129 Hypertensive chronic kidney disease with stage 1 through stage 4 chronic kidney disease, or unspecified chronic kidney disease: Secondary | ICD-10-CM | POA: Insufficient documentation

## 2018-10-30 DIAGNOSIS — E7800 Pure hypercholesterolemia, unspecified: Secondary | ICD-10-CM | POA: Diagnosis not present

## 2018-10-30 DIAGNOSIS — Z7901 Long term (current) use of anticoagulants: Secondary | ICD-10-CM | POA: Insufficient documentation

## 2018-10-30 DIAGNOSIS — E1165 Type 2 diabetes mellitus with hyperglycemia: Secondary | ICD-10-CM | POA: Diagnosis not present

## 2018-10-30 DIAGNOSIS — N184 Chronic kidney disease, stage 4 (severe): Secondary | ICD-10-CM | POA: Insufficient documentation

## 2018-10-30 DIAGNOSIS — R0602 Shortness of breath: Secondary | ICD-10-CM | POA: Diagnosis not present

## 2018-10-30 DIAGNOSIS — E0500 Thyrotoxicosis with diffuse goiter without thyrotoxic crisis or storm: Secondary | ICD-10-CM

## 2018-10-30 DIAGNOSIS — M1990 Unspecified osteoarthritis, unspecified site: Secondary | ICD-10-CM | POA: Insufficient documentation

## 2018-10-30 DIAGNOSIS — E1122 Type 2 diabetes mellitus with diabetic chronic kidney disease: Secondary | ICD-10-CM

## 2018-10-30 DIAGNOSIS — Z9119 Patient's noncompliance with other medical treatment and regimen: Secondary | ICD-10-CM

## 2018-10-30 DIAGNOSIS — I10 Essential (primary) hypertension: Secondary | ICD-10-CM | POA: Diagnosis not present

## 2018-10-30 DIAGNOSIS — Z7984 Long term (current) use of oral hypoglycemic drugs: Secondary | ICD-10-CM | POA: Diagnosis not present

## 2018-10-30 DIAGNOSIS — Z79899 Other long term (current) drug therapy: Secondary | ICD-10-CM | POA: Diagnosis not present

## 2018-10-30 DIAGNOSIS — Z91199 Patient's noncompliance with other medical treatment and regimen due to unspecified reason: Secondary | ICD-10-CM

## 2018-10-30 LAB — POCT GLYCOSYLATED HEMOGLOBIN (HGB A1C): HbA1c, POC (controlled diabetic range): 6.4 % (ref 0.0–7.0)

## 2018-10-30 LAB — GLUCOSE, POCT (MANUAL RESULT ENTRY): POC Glucose: 105 mg/dl — AB (ref 70–99)

## 2018-10-30 MED ORDER — GLIPIZIDE 5 MG PO TABS: 2.5000 mg | ORAL_TABLET | Freq: Every day | ORAL | 6 refills | Status: DC

## 2018-10-30 MED ORDER — ATORVASTATIN CALCIUM 20 MG PO TABS: 20.0000 mg | ORAL_TABLET | Freq: Every day | ORAL | 6 refills | Status: DC

## 2018-10-30 MED ORDER — AMLODIPINE BESYLATE 10 MG PO TABS: 10.0000 mg | ORAL_TABLET | Freq: Every day | ORAL | 6 refills | Status: DC

## 2018-10-30 MED FILL — AMLODIPINE BESYLATE 10 MG T: 10 | 30 days supply | Qty: 30 | Fill #0

## 2018-10-30 MED FILL — ATORVASTATIN 20 MG TABLET: 20 | 30 days supply | Qty: 30 | Fill #0

## 2018-10-30 MED FILL — glipiZIDE 5 MG TABS: 5 | 30 days supply | Qty: 15 | Fill #0

## 2018-10-30 NOTE — Patient Instructions (Signed)

## 2018-10-30 NOTE — Progress Notes (Signed)
Subjective:  Patient ID: Edward [name], male    DOB: 03-07-1971  Age: 48 y.o. MRN: [id number]  CC: Diabetes   HPI Edward YELLOWHAIR Sr. is a 48 year old male with a history of hypertension, type 2 diabetes mellitus (A1c 6.4), Graves' disease (status post radioactive iodine treatment in 07/2017), stage III chronic kidney disease, erectile dysfunction who presents today for a follow-up visit. A1c is 6.4 and he endorses compliance with glipizide. Blood pressure is elevated today and he is yet to take his antihypertensives; compliance with medications has been a huge challenge for him. Seen by endocrine for management of Graves' disease and is currently on levothyroxine; his last TSH was elevated at > 49.1 and his dose of levothyroxine adjusted.  Since his last visit he has been to see cardiology due to complaints of chest pains and cardiac work-up by means of echo revealed EF of 50 to 78%, grade 2 diastolic dysfunction no regional wall motion abnormality. Lexiscan stress test revealed an EF of 40% with diffuse hypokinesis, no evidence for ischemia or infarction on perfusion images. His visit with cardiology was on 10/24/2018.  Past Medical History:  Diagnosis Date  . Anemia   . Arthritis   . Chronic kidney disease    stage 5  . Diabetes mellitus   . Graves disease 2014  . Headache   . Hypertension   . Non-compliance   . Shortness of breath dyspnea     Past Surgical History:  Procedure Laterality Date  . CIRCUMCISION     as a child, around 40 years of age  . EYE SURGERY    . PARS PLANA VITRECTOMY Right 03/25/2016   Procedure: PARS PLANA VITRECTOMY 25 GAUGE FOR ENDOPHTHALMITIS;  Surgeon: Jalene Mullet, MD;  Location: [place];  Service: Ophthalmology;  Laterality: Right;  . WISDOM TOOTH EXTRACTION      No Known Allergies   Outpatient Medications Prior to Visit  Medication Sig Dispense Refill  . carvedilol (COREG) 3.125 MG tablet Take 1 tablet (3.125 mg total) by  mouth 2 (two) times daily. 180 tablet 3  . levothyroxine (SYNTHROID, LEVOTHROID) 112 MCG tablet Take 1 tablet (112 mcg total) by mouth daily. 60 tablet 0  . torsemide (DEMADEX) 100 MG tablet Take 100 mg by mouth daily.  6  . amLODipine (NORVASC) 10 MG tablet Take 1 tablet (10 mg total) by mouth daily. 30 tablet 3  . atorvastatin (LIPITOR) 20 MG tablet Take 1 tablet (20 mg total) by mouth daily. 30 tablet 3  . glipiZIDE (GLUCOTROL) 5 MG tablet Take 0.5 tablets (2.5 mg total) by mouth daily before breakfast. 30 tablet 3   No facility-administered medications prior to visit.     ROS Review of Systems  Constitutional: Negative for activity change and appetite change.  HENT: Negative for sinus pressure and sore throat.   Eyes: Positive for visual disturbance (L eye - blind).  Respiratory: Negative for cough, chest tightness and shortness of breath.   Cardiovascular: Negative for chest pain and leg swelling.  Gastrointestinal: Negative for abdominal distention, abdominal pain, constipation and diarrhea.  Endocrine: Negative.   Genitourinary: Negative for dysuria.  Musculoskeletal: Negative for joint swelling and myalgias.  Skin: Negative for rash.  Allergic/Immunologic: Negative.   Neurological: Negative for weakness, light-headedness and numbness.  Psychiatric/Behavioral: Negative for dysphoric mood and suicidal ideas.    Objective:  BP (!) 164/94   Pulse 74   Temp 98.4 F (36.9 C) (Oral)   Ht 6' (1.829 m)  Wt 242 lb 12.8 oz (110.1 kg)   SpO2 100%   BMI 32.93 kg/m   BP/Weight 10/30/2018 10/24/2018 0/07/9322  Systolic BP 557 322 025  Diastolic BP 94 427 062  Wt. (Lbs) 242.8 240.8 -  BMI 32.93 32.66 -      Physical Exam Constitutional:      Appearance: He is well-developed.  Eyes:     Comments: Cataract in left eye Visually impaired and left eye  Cardiovascular:     Rate and Rhythm: Normal rate.     Heart sounds: Normal heart sounds. No murmur.  Pulmonary:     Effort:  Pulmonary effort is normal.     Breath sounds: Normal breath sounds. No wheezing or rales.  Chest:     Chest wall: No tenderness.  Abdominal:     General: Bowel sounds are normal. There is no distension.     Palpations: Abdomen is soft. There is no mass.     Tenderness: There is no abdominal tenderness.  Musculoskeletal: Normal range of motion.  Neurological:     Mental Status: He is alert and oriented to person, place, and time.      CMP Latest Ref Rng & Units 08/20/2018 03/21/2018 03/06/2018  Glucose 65 - 99 mg/dL 140(H) 206(H) 118(H)  BUN 6 - 24 mg/dL 36(H) 32(H) 43(H)  Creatinine 0.76 - 1.27 mg/dL 4.01(H) 3.67(H) 3.82(H)  Sodium 134 - 144 mmol/L 143 139 144  Potassium 3.5 - 5.2 mmol/L 4.2 3.7 5.3(H)  Chloride 96 - 106 mmol/L 107(H) 108 113(H)  CO2 20 - 29 mmol/L 20 22 17(L)  Calcium 8.7 - 10.2 mg/dL 8.8 8.6(L) 8.7  Total Protein 6.5 - 8.1 g/dL - 8.1 6.9  Total Bilirubin 0.3 - 1.2 mg/dL - 0.6 0.2  Alkaline Phos 38 - 126 U/L - 95 121(H)  AST 15 - 41 U/L - 43(H) 30  ALT 17 - 63 U/L - 37 30    Lipid Panel     Component Value Date/Time   CHOL 242 (H) 06/11/2018 0854   TRIG 107 06/11/2018 0854   HDL 84 06/11/2018 0854   CHOLHDL 2.9 06/11/2018 0854   CHOLHDL 2.8 09/19/2016 1023   VLDL 37 (H) 09/19/2016 1023   LDLCALC 137 (H) 06/11/2018 0854    Lab Results  Component Value Date   HGBA1C 6.4 10/30/2018     Assessment & Plan:   1. Type 2 diabetes mellitus with stage 4 chronic kidney disease, without long-term current use of insulin (HCC) Controlled with A1c of 6.4 Counseled on Diabetic diet, my plate method, 376 minutes of moderate intensity exercise/week Keep blood sugar logs with fasting goals of 80-120 mg/dl, random of less than 180 and in the event of sugars less than 60 mg/dl or greater than 400 mg/dl please notify the clinic ASAP. It is recommended that you undergo annual eye exams and annual foot exams. Pneumonia vaccine is recommended. - POCT glucose (manual  entry) - POCT glycosylated hemoglobin (Hb A1C) - glipiZIDE (GLUCOTROL) 5 MG tablet; Take 0.5 tablets (2.5 mg total) by mouth daily before breakfast.  Dispense: 15 tablet; Refill: 6  2. Uncontrolled hypertension Uncontrolled due to noncompliance Counseled on blood pressure goal of less than 130/80, low-sodium, DASH diet, medication compliance, 150 minutes of moderate intensity exercise per week. Discussed medication compliance, adverse effects. - amLODipine (NORVASC) 10 MG tablet; Take 1 tablet (10 mg total) by mouth daily.  Dispense: 30 tablet; Refill: 6  3. Pure hypercholesterolemia Low-cholesterol diet - atorvastatin (LIPITOR) 20 MG  tablet; Take 1 tablet (20 mg total) by mouth daily.  Dispense: 30 tablet; Refill: 6  4. Stage 4 chronic kidney disease (HCC) Hypertensive nephropathy coupled with noncompliance largely contributory Currently followed by nephrology  5. Graves disease Status post radioactive iodine treatments Currently on levothyroxine Followed by endocrine  6. Non compliance with medical treatment We have again discussed the complications of his medical conditions especially in the light of his noncompliance and he promises to do better as he has always said but still remains noncompliant   Meds ordered this encounter  Medications  . amLODipine (NORVASC) 10 MG tablet    Sig: Take 1 tablet (10 mg total) by mouth daily.    Dispense:  30 tablet    Refill:  6  . atorvastatin (LIPITOR) 20 MG tablet    Sig: Take 1 tablet (20 mg total) by mouth daily.    Dispense:  30 tablet    Refill:  6  . glipiZIDE (GLUCOTROL) 5 MG tablet    Sig: Take 0.5 tablets (2.5 mg total) by mouth daily before breakfast.    Dispense:  15 tablet    Refill:  6    Follow-up: Return in about 3 months (around 01/29/2019) for Follow-up of chronic medical conditions.   [name] MD

## 2018-11-14 DIAGNOSIS — I129 Hypertensive chronic kidney disease with stage 1 through stage 4 chronic kidney disease, or unspecified chronic kidney disease: Secondary | ICD-10-CM | POA: Diagnosis not present

## 2018-11-14 DIAGNOSIS — N189 Chronic kidney disease, unspecified: Secondary | ICD-10-CM | POA: Diagnosis not present

## 2018-11-14 DIAGNOSIS — N184 Chronic kidney disease, stage 4 (severe): Secondary | ICD-10-CM | POA: Diagnosis not present

## 2018-11-14 DIAGNOSIS — Z9114 Patient's other noncompliance with medication regimen: Secondary | ICD-10-CM | POA: Diagnosis not present

## 2018-11-14 DIAGNOSIS — D631 Anemia in chronic kidney disease: Secondary | ICD-10-CM | POA: Diagnosis not present

## 2018-11-14 DIAGNOSIS — N2581 Secondary hyperparathyroidism of renal origin: Secondary | ICD-10-CM | POA: Diagnosis not present

## 2018-11-15 ENCOUNTER — Ambulatory Visit: Payer: Medicare HMO

## 2018-11-15 NOTE — Progress Notes (Deleted)
     HPI:  Edward MROCZKOWSKI Sr. is a 48 y.o. male patient of Dr Harrell Gave, with a [place] below who presents today for hypertension clinic follow up.  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    Patient admitted missing medication for 2 weeks during most recent OV with DR Harrell Gave on 10/24/2018. Long time was spent talking to patient about compliance. Note compliance issues addressed by HTN clinic, cardiologist, and PCP.   Blood Pressure Goal:  130/80  Current Medications:  Amlodipine 10 mg in evening  Carvedilol 3.125mg  twice daily  Torsemide 100 mg daily   Family Hx:  Father died when infant, mother when 82.    One full sister also with hypertension, DM, now 91  Social Hx:  Smokes, 1 ppw - mini cigars; has cut way back on alcohol, 1 beer/week these days at most; rarely drinks caffeine unless he is feeling under the weather  Diet:  Not much appetite recently, only grazing - admits to missing meals daily, although he cooks for his kids daily  Exercise:  No regular exercise - since feet swelling  Home BP readings:  Intolerances:   Avoid ACEI/ARB, spironolactone and thiazides due to current kidney function.  Would like to use low dose ACE due to DM, but will monitor SCr for stability for awhile.    Labs:  08/2018:  Na 143, K 4.2, Glu 140, BUN 36, SCr 4.01;  TSH 88.6  Wt Readings from Last 3 Encounters:  10/30/18 242 lb 12.8 oz (110.1 kg)  10/24/18 240 lb 12.8 oz (109.2 kg)  10/11/18 229 lb 6.4 oz (104.1 kg)   BP Readings from Last 3 Encounters:  10/30/18 (!) 164/94  10/24/18 (!) 172/108  10/22/18 (!) 173/110   Pulse Readings from Last 3 Encounters:  10/30/18 74  10/24/18 77  10/22/18 78    Current Outpatient Medications  Medication Sig Dispense Refill  . amLODipine (NORVASC) 10 MG tablet Take 1 tablet (10 mg total) by mouth daily.  30 tablet 6  . atorvastatin (LIPITOR) 20 MG tablet Take 1 tablet (20 mg total) by mouth daily. 30 tablet 6  . carvedilol (COREG) 3.125 MG tablet Take 1 tablet (3.125 mg total) by mouth 2 (two) times daily. 180 tablet 3  . glipiZIDE (GLUCOTROL) 5 MG tablet Take 0.5 tablets (2.5 mg total) by mouth daily before breakfast. 15 tablet 6  . levothyroxine (SYNTHROID, LEVOTHROID) 112 MCG tablet Take 1 tablet (112 mcg total) by mouth daily. 60 tablet 0  . torsemide (DEMADEX) 100 MG tablet Take 100 mg by mouth daily.  6   No current facility-administered medications for this visit.     No Known Allergies  Past Medical History:  Diagnosis Date  . Anemia   . Arthritis   . Chronic kidney disease    stage 5  . Diabetes mellitus   . Graves disease 2014  . Headache   . Hypertension   . Non-compliance   . Shortness of breath dyspnea     There were no vitals taken for this visit.  No problem-specific Assessment & Plan notes found for this encounter.   Trentyn Boisclair Rodriguez-Guzman PharmD, BCPS, [place] [id number] 11/15/2018 7:39 AM

## 2018-11-16 ENCOUNTER — Other Ambulatory Visit (HOSPITAL_COMMUNITY): Payer: Self-pay | Admitting: *Deleted

## 2018-11-16 ENCOUNTER — Encounter (HOSPITAL_COMMUNITY): Payer: Medicare Other

## 2018-11-16 ENCOUNTER — Encounter: Payer: Medicare Other | Admitting: Vascular Surgery

## 2018-11-16 ENCOUNTER — Inpatient Hospital Stay (HOSPITAL_COMMUNITY): Admission: RE | Admit: 2018-11-16 | Payer: Medicare Other | Source: Ambulatory Visit

## 2018-11-19 ENCOUNTER — Ambulatory Visit (HOSPITAL_COMMUNITY)
Admission: RE | Admit: 2018-11-19 | Discharge: 2018-11-19 | Disposition: A | Discharge status: Home / Self Care | Payer: Medicare HMO | Source: Ambulatory Visit | Attending: Nephrology | Admitting: Nephrology

## 2018-11-19 VITALS — BP 169/106 | HR 70 | Temp 98.6°F | Resp 20 | Ht 72.0 in | Wt 242.0 lb

## 2018-11-19 DIAGNOSIS — N189 Chronic kidney disease, unspecified: Secondary | ICD-10-CM | POA: Diagnosis not present

## 2018-11-19 DIAGNOSIS — N179 Acute kidney failure, unspecified: Secondary | ICD-10-CM

## 2018-11-19 DIAGNOSIS — N184 Chronic kidney disease, stage 4 (severe): Secondary | ICD-10-CM

## 2018-11-19 LAB — POCT HEMOGLOBIN-HEMACUE: Hemoglobin: 9.6 g/dL — ABNORMAL LOW (ref 13.0–17.0)

## 2018-11-19 MED ORDER — SODIUM CHLORIDE 0.9 % IV SOLN
510.0000 mg | INTRAVENOUS | Status: DC
  Administered 2018-11-19: 510 mg via INTRAVENOUS
  Filled 2018-11-19: qty 510

## 2018-11-19 MED ORDER — EPOETIN ALFA-EPBX 10000 UNIT/ML IJ SOLN
10000.0000 [IU] | INTRAMUSCULAR | Status: DC
  Administered 2018-11-19: 10000 [IU] via SUBCUTANEOUS
  Filled 2018-11-19: qty 1

## 2018-11-19 NOTE — Discharge Instructions (Signed)

## 2018-11-21 ENCOUNTER — Encounter: Payer: Self-pay | Admitting: Vascular Surgery

## 2018-11-21 ENCOUNTER — Other Ambulatory Visit: Payer: Self-pay | Admitting: *Deleted

## 2018-11-21 ENCOUNTER — Other Ambulatory Visit: Payer: Self-pay

## 2018-11-21 ENCOUNTER — Encounter: Payer: Self-pay | Admitting: *Deleted

## 2018-11-21 ENCOUNTER — Ambulatory Visit (INDEPENDENT_AMBULATORY_CARE_PROVIDER_SITE_OTHER): Payer: Medicare HMO | Admitting: Vascular Surgery

## 2018-11-21 ENCOUNTER — Ambulatory Visit (INDEPENDENT_AMBULATORY_CARE_PROVIDER_SITE_OTHER)
Admission: RE | Admit: 2018-11-21 | Discharge: 2018-11-21 | Disposition: A | Discharge status: Home / Self Care | Payer: Medicare HMO | Source: Ambulatory Visit | Attending: Vascular Surgery | Admitting: Vascular Surgery

## 2018-11-21 ENCOUNTER — Ambulatory Visit (HOSPITAL_COMMUNITY)
Admission: RE | Admit: 2018-11-21 | Discharge: 2018-11-21 | Disposition: A | Discharge status: Home / Self Care | Payer: Medicare HMO | Source: Ambulatory Visit | Attending: Vascular Surgery | Admitting: Vascular Surgery

## 2018-11-21 VITALS — BP 191/110 | HR 83 | Resp 18 | Ht 72.0 in | Wt 242.0 lb

## 2018-11-21 DIAGNOSIS — N184 Chronic kidney disease, stage 4 (severe): Secondary | ICD-10-CM

## 2018-11-21 NOTE — Progress Notes (Signed)
REASON FOR CONSULT:    To evaluate for hemodialysis access.  The consult is requested by Dr. Carolin Sicks  HPI:   Edward [name] is a pleasant 48 y.o. male, who was referred for evaluation hemodialysis access.  I have reviewed the records from the referring office.  Patient has chronic kidney disease secondary to diabetes which is been poorly controlled.  He also has retinopathy with blindness.  In addition he has a history of noncompliance with medications.  Patient has noted some swelling recently but otherwise denies any recent uremic symptoms.  Specifically, he denies nausea, vomiting, fatigue, anorexia, or palpitations.  He is right-handed.  Past Medical History:  Diagnosis Date  . Anemia   . Arthritis   . Chronic kidney disease    stage 5  . Diabetes mellitus   . Graves disease 2014  . Headache   . Hypertension   . Non-compliance   . Shortness of breath dyspnea     Family History  Problem Relation Age of Onset  . Hypertension Mother   . Diabetes Mother     SOCIAL HISTORY: Social History   Socioeconomic History  . Marital status: Married    Spouse name: Not on file  . Number of children: Not on file  . Years of education: Not on file  . Highest education level: Not on file  Occupational History  . Not on file  Social Needs  . Financial resource strain: Not on file  . Food insecurity:    Worry: Not on file    Inability: Not on file  . Transportation needs:    Medical: Not on file    Non-medical: Not on file  Tobacco Use  . Smoking status: Former Smoker    Packs/day: 0.75    Years: 3.00    Pack years: 2.25    Types: Cigarettes    Last attempt to quit: 05/28/2000    Years since quitting: 18.4  . Smokeless tobacco: Never Used  Substance and Sexual Activity  . Alcohol use: Yes    Alcohol/week: 0.0 standard drinks    Comment: 1 beer every night  . Drug use: No  . Sexual activity: Not on file  Lifestyle  . Physical activity:    Days per week:  Not on file    Minutes per session: Not on file  . Stress: Not on file  Relationships  . Social connections:    Talks on phone: Not on file    Gets together: Not on file    Attends religious service: Not on file    Active member of club or organization: Not on file    Attends meetings of clubs or organizations: Not on file    Relationship status: Not on file  . Intimate partner violence:    Fear of current or ex partner: Not on file    Emotionally abused: Not on file    Physically abused: Not on file    Forced sexual activity: Not on file  Other Topics Concern  . Not on file  Social History Narrative  . Not on file    No Known Allergies  Current Outpatient Medications  Medication Sig Dispense Refill  . amLODipine (NORVASC) 10 MG tablet Take 1 tablet (10 mg total) by mouth daily. 30 tablet 6  . atorvastatin (LIPITOR) 20 MG tablet Take 1 tablet (20 mg total) by mouth daily. 30 tablet 6  . carvedilol (COREG) 3.125 MG tablet Take 1 tablet (3.125 mg total) by mouth  2 (two) times daily. 180 tablet 3  . glipiZIDE (GLUCOTROL) 5 MG tablet Take 0.5 tablets (2.5 mg total) by mouth daily before breakfast. 15 tablet 6  . levothyroxine (SYNTHROID, LEVOTHROID) 112 MCG tablet Take 1 tablet (112 mcg total) by mouth daily. 60 tablet 0  . torsemide (DEMADEX) 100 MG tablet Take 100 mg by mouth daily.  6   No current facility-administered medications for this visit.     REVIEW OF SYSTEMS:  [X]  denotes positive finding, [ ]  denotes negative finding Cardiac  Comments:  Chest pain or chest pressure: x   Shortness of breath upon exertion: x   Short of breath when lying flat: x   Irregular heart rhythm: x       Vascular    Pain in calf, thigh, or hip brought on by ambulation: x   Pain in feet at night that wakes you up from your sleep:  x   Blood clot in your veins:    Leg swelling:  x       Pulmonary    Oxygen at home:    Productive cough:     Wheezing:         Neurologic    Sudden  weakness in arms or legs:     Sudden numbness in arms or legs:     Sudden onset of difficulty speaking or slurred speech:    Temporary loss of vision in one eye:     Problems with dizziness:  x       Gastrointestinal    Blood in stool:     Vomited blood:         Genitourinary    Burning when urinating:     Blood in urine: x       Psychiatric    Major depression:         Hematologic    Bleeding problems:    Problems with blood clotting too easily:        Skin    Rashes or ulcers: x       Constitutional    Fever or chills:     PHYSICAL EXAM:   Vitals:   11/21/18 1134 11/21/18 1135  BP: (!) 191/115 (!) 191/110  Pulse: 83   Resp: 18   SpO2: 100%   Weight: 242 lb (109.8 kg)   Height: 6' (1.829 m)    GENERAL: The patient is a well-nourished male, in no acute distress. The vital signs are documented above. CARDIAC: There is a regular rate and rhythm.  VASCULAR: I do not detect carotid bruits. He has palpable radial pulses bilaterally. He has mild bilateral lower extremity swelling. PULMONARY: There is good air exchange bilaterally without wheezing or rales. ABDOMEN: Soft and non-tender with normal pitched bowel sounds.  MUSCULOSKELETAL: There are no major deformities or cyanosis. NEUROLOGIC: No focal weakness or paresthesias are detected. SKIN: There are no ulcers or rashes noted. PSYCHIATRIC: The patient has a normal affect.  DATA:    UPPER EXTREMITY VEIN MAP: I have independently interpreted his upper extremity vein map.  On the left side the forearm cephalic vein looks small.  The upper arm cephalic vein looks small.  The basilic vein is small.  There are multiple branches noted in both of these veins also.  On the right side the forearm cephalic vein is marginal in size.  The upper arm cephalic vein there is down to 0.29 cm.  The basilic vein is not visualized in the upper arm.  UPPER  EXTREMITY ARTERIAL DUPLEX: I have independently interpreted his upper  extremity arterial duplex.  On the left side, there is a triphasic radial and ulnar waveform.  The brachial artery measures 0.5 cm in diameter.  On the right side there is a triphasic radial and ulnar waveform.  The brachial artery measures 0.49 cm in diameter.  LABS: I reviewed his labs that were done on 06/12/2018.  At that time his GFR was 18.  GFR on 08/20/2018 was 19.  ASSESSMENT & PLAN:   STAGE IV CHRONIC KIDNEY DISEASE: Based on his vein map, his best option for a fistula appears to be a right brachiocephalic fistula.  The veins in the left arm are very small with multiple branches.  In addition he tells me that they have a hard time drawing blood from the left arm and usually have to go to the right side.  Thus if we are going to do a fistula I think it would be best to try the on the right side.  He understands if the vein is not adequate we would have to place an AV graft.  Given that he has stage IV chronic kidney disease I will try to place a fistula if at all possible even if the vein is marginal in size.  I have explained the indications for placement of an AV fistula or AV graft. I've explained that if at all possible we will place an AV fistula.  I have reviewed the risks of placement of an AV fistula including but not limited to: failure of the fistula to mature, need for subsequent interventions, and thrombosis. In addition I have reviewed the potential complications of placement of an AV graft. These risks include, but are not limited to, graft thrombosis, graft infection, wound healing problems, bleeding, arm swelling, and steal syndrome. All the patient's questions were answered and they are agreeable to proceed with surgery.  Of note, his blood pressure was very high today in the office (191/110).  However, he did not take his blood pressure medications this morning.  I have instructed him to be sure to take his blood pressure medicines in the morning prior to surgery as he will  will get canceled if his blood pressure is too high the day of surgery.  Deitra Mayo [facility] [phone]

## 2018-11-26 ENCOUNTER — Encounter (HOSPITAL_COMMUNITY): Payer: Medicare HMO

## 2018-11-29 ENCOUNTER — Encounter: Payer: Self-pay | Admitting: Nephrology

## 2018-11-30 ENCOUNTER — Ambulatory Visit (HOSPITAL_COMMUNITY)
Admission: RE | Admit: 2018-11-30 | Discharge: 2018-11-30 | Disposition: A | Discharge status: Home / Self Care | Payer: Medicare HMO | Source: Ambulatory Visit | Attending: Nephrology | Admitting: Nephrology

## 2018-11-30 DIAGNOSIS — D631 Anemia in chronic kidney disease: Secondary | ICD-10-CM | POA: Insufficient documentation

## 2018-11-30 DIAGNOSIS — N184 Chronic kidney disease, stage 4 (severe): Secondary | ICD-10-CM | POA: Insufficient documentation

## 2018-11-30 MED ORDER — SODIUM CHLORIDE 0.9 % IV SOLN
510.0000 mg | INTRAVENOUS | Status: AC
  Administered 2018-11-30: 510 mg via INTRAVENOUS
  Filled 2018-11-30: qty 17

## 2018-12-03 ENCOUNTER — Emergency Department (HOSPITAL_COMMUNITY): Payer: Medicare HMO

## 2018-12-03 ENCOUNTER — Emergency Department (HOSPITAL_COMMUNITY)
Admission: EM | Admit: 2018-12-03 | Discharge: 2018-12-03 | Disposition: A | Discharge status: Home / Self Care | Payer: Medicare HMO | Attending: Emergency Medicine | Admitting: Emergency Medicine

## 2018-12-03 ENCOUNTER — Encounter (HOSPITAL_COMMUNITY): Payer: Self-pay | Admitting: *Deleted

## 2018-12-03 DIAGNOSIS — S80211A Abrasion, right knee, initial encounter: Secondary | ICD-10-CM | POA: Insufficient documentation

## 2018-12-03 DIAGNOSIS — Y999 Unspecified external cause status: Secondary | ICD-10-CM | POA: Insufficient documentation

## 2018-12-03 DIAGNOSIS — S6991XA Unspecified injury of right wrist, hand and finger(s), initial encounter: Secondary | ICD-10-CM | POA: Diagnosis not present

## 2018-12-03 DIAGNOSIS — Y92512 Supermarket, store or market as the place of occurrence of the external cause: Secondary | ICD-10-CM | POA: Insufficient documentation

## 2018-12-03 DIAGNOSIS — W010XXA Fall on same level from slipping, tripping and stumbling without subsequent striking against object, initial encounter: Secondary | ICD-10-CM | POA: Diagnosis not present

## 2018-12-03 DIAGNOSIS — I132 Hypertensive heart and chronic kidney disease with heart failure and with stage 5 chronic kidney disease, or end stage renal disease: Secondary | ICD-10-CM | POA: Insufficient documentation

## 2018-12-03 DIAGNOSIS — S80212A Abrasion, left knee, initial encounter: Secondary | ICD-10-CM | POA: Diagnosis not present

## 2018-12-03 DIAGNOSIS — N185 Chronic kidney disease, stage 5: Secondary | ICD-10-CM | POA: Insufficient documentation

## 2018-12-03 DIAGNOSIS — W19XXXA Unspecified fall, initial encounter: Secondary | ICD-10-CM

## 2018-12-03 DIAGNOSIS — E119 Type 2 diabetes mellitus without complications: Secondary | ICD-10-CM | POA: Insufficient documentation

## 2018-12-03 DIAGNOSIS — Z79899 Other long term (current) drug therapy: Secondary | ICD-10-CM | POA: Diagnosis not present

## 2018-12-03 DIAGNOSIS — M25561 Pain in right knee: Secondary | ICD-10-CM

## 2018-12-03 DIAGNOSIS — M25562 Pain in left knee: Secondary | ICD-10-CM | POA: Diagnosis not present

## 2018-12-03 DIAGNOSIS — Z7984 Long term (current) use of oral hypoglycemic drugs: Secondary | ICD-10-CM | POA: Insufficient documentation

## 2018-12-03 DIAGNOSIS — Y939 Activity, unspecified: Secondary | ICD-10-CM | POA: Insufficient documentation

## 2018-12-03 DIAGNOSIS — M79641 Pain in right hand: Secondary | ICD-10-CM | POA: Diagnosis not present

## 2018-12-03 DIAGNOSIS — Z87891 Personal history of nicotine dependence: Secondary | ICD-10-CM | POA: Diagnosis not present

## 2018-12-03 DIAGNOSIS — I5032 Chronic diastolic (congestive) heart failure: Secondary | ICD-10-CM | POA: Insufficient documentation

## 2018-12-03 DIAGNOSIS — S8992XA Unspecified injury of left lower leg, initial encounter: Secondary | ICD-10-CM | POA: Diagnosis not present

## 2018-12-03 DIAGNOSIS — S8991XA Unspecified injury of right lower leg, initial encounter: Secondary | ICD-10-CM | POA: Diagnosis not present

## 2018-12-03 NOTE — Discharge Instructions (Signed)
Rest - Please avoid long periods of standing or a lot of walking to help with pain in the knees Ice - ice for 20 minutes at a time, several times a day Elevate - elevate legs and hand to help with swelling Tylenol - take every 4 hours as needed for pain

## 2018-12-03 NOTE — ED Provider Notes (Signed)
[facility]ERG[facility] DEPARTMENT Provider Note   CSN: [id number] Arrival date & time: 12/03/18  0857    History   Chief Complaint Chief Complaint  Patient presents with  . Fall    HPI Edward CUTSHAW Sr. is a 48 y.o. male who presents with a fall, right hand pain, bilateral knee pain.  Past medical history significant for CKD stage V, diabetes, hypertension, legal blindness, thyroid disease.  Patient states that he was with his daughter at the dollar store last night when he suddenly fell.  He is unsure why he fell.  Did not feel dizzy or lightheaded.  He did not pass out.  He is unsure if he tripped or not.  He states he is legally blind so may have tripped.  He has overall felt well recently.Marland Kitchen  He came to the ED this morning because of the swelling in his right hand and his knee pain.  He states that his bones are brittle and therefore he wanted to make sure nothing was broken.  He is having pain in his entire right hand and extends to his middle finger and ring finger.  He is also having bilateral anterior knee pain.  He has been ambulatory. He has been putting neosporin on the wounds and dressing them with a bandage. Rest makes it better.  Movement makes it worse.    HPI  Past Medical History:  Diagnosis Date  . Anemia   . Arthritis   . Chronic kidney disease    stage 5  . Diabetes mellitus   . Graves disease 2014  . Headache   . Hypertension   . Non-compliance   . Shortness of breath dyspnea     Patient Active Problem List   Diagnosis Date Noted  . Chronic diastolic heart failure ([place]) 10/24/2018  . 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 ED (erectile dysfunction) 08/10/2017  . Chronic kidney disease 07/18/2017  . Arthralgia 07/18/2017  . Arthritis 07/18/2017  . Graves disease 03/02/2016  . Non compliance with medical treatment 11/26/2015  . Uncontrolled hypertension 11/26/2015  . Acute kidney injury superimposed on chronic kidney disease  ([place]) 11/25/2015  . Hyperkalemia 11/25/2015  . Diabetes mellitus type 2, uncontrolled, with complications ([place]) 61/44/3154    Past Surgical History:  Procedure Laterality Date  . CIRCUMCISION     as a child, around 31 years of age  . EYE SURGERY    . PARS PLANA VITRECTOMY Right 03/25/2016   Procedure: PARS PLANA VITRECTOMY 25 GAUGE FOR ENDOPHTHALMITIS;  Surgeon: Jalene Mullet, MD;  Location: [facility];  Service: Ophthalmology;  Laterality: Right;  . WISDOM TOOTH EXTRACTION          Home Medications    Prior to Admission medications   Medication Sig Start Date End Date Taking? Authorizing Provider  amLODipine (NORVASC) 10 MG tablet Take 1 tablet (10 mg total) by mouth daily. 10/30/18   [name], MD  atorvastatin (LIPITOR) 20 MG tablet Take 1 tablet (20 mg total) by mouth daily. 10/30/18   [name], MD  carvedilol (COREG) 3.125 MG tablet Take 1 tablet (3.125 mg total) by mouth 2 (two) times daily. 09/10/18 12/09/18  Buford Dresser, MD  glipiZIDE (GLUCOTROL) 5 MG tablet Take 0.5 tablets (2.5 mg total) by mouth daily before breakfast. 10/30/18   [name], MD  levothyroxine (SYNTHROID, LEVOTHROID) 112 MCG tablet Take 1 tablet (112 mcg total) by mouth daily. 10/24/18   Philemon Kingdom, MD  torsemide (DEMADEX) 100 MG tablet Take 100  mg by mouth daily. 06/25/18   [provider]    Family History Family History  Problem Relation Age of Onset  . Hypertension Mother   . Diabetes Mother     Social History Social History   Tobacco Use  . Smoking status: Former Smoker    Packs/day: 0.75    Years: 3.00    Pack years: 2.25    Types: Cigarettes    Last attempt to quit: 05/28/2000    Years since quitting: 18.5  . Smokeless tobacco: Never Used  Substance Use Topics  . Alcohol use: Yes    Alcohol/week: 0.0 standard drinks    Comment: 1 beer every night  . Drug use: No     Allergies   Patient has no known allergies.   Review of Systems Review of  Systems  Respiratory: Negative for shortness of breath.   Cardiovascular: Negative for chest pain.  Gastrointestinal: Negative for abdominal pain.  Musculoskeletal: Positive for arthralgias and joint swelling.  Neurological: Negative for dizziness, syncope, weakness, light-headedness and headaches.  All other systems reviewed and are negative.    Physical Exam Updated Vital Signs BP (!) 175/113 (BP Location: Right Arm)   Pulse 88   Temp 98.4 F (36.9 C) (Oral)   Resp 16   SpO2 100%   Physical Exam Vitals signs and nursing note reviewed.  Constitutional:      General: He is not in acute distress.    Appearance: Normal appearance. He is well-developed.     Comments: Calm and cooperative.  HENT:     Head: Normocephalic and atraumatic.  Eyes:     General: No scleral icterus.       Right eye: No discharge.        Left eye: No discharge.     Conjunctiva/sclera: Conjunctivae normal.     Pupils: Pupils are equal, round, and reactive to light.  Neck:     Musculoskeletal: Normal range of motion.  Cardiovascular:     Rate and Rhythm: Normal rate and regular rhythm.  Pulmonary:     Effort: Pulmonary effort is normal. No respiratory distress.     Breath sounds: Normal breath sounds.  Abdominal:     General: There is no distension.     Palpations: Abdomen is soft.     Tenderness: There is no abdominal tenderness.  Musculoskeletal:     Comments: Right hand: Diffuse, mild swelling of hand and finger. Abrasion over the dorsal aspect. Dark, dried blood under the tip of the middle finger nail without evidence of subungual hematoma. No snuff box tenderness. FROM of thumb, index, pinky finger. Decreased ROM of middle and ring finger.  Right knee: Several abrasions over anterior knee. Decreased ROM of knee. No obvious swelling or deformity  Left knee: Several abrasions over anterior knee. Decreased ROM of knee. No obvious swelling or deformity  Skin:    General: Skin is warm and dry.    Neurological:     Mental Status: He is alert and oriented to person, place, and time.  Psychiatric:        Behavior: Behavior normal.      ED Treatments / Results  Labs (all labs ordered are listed, but only abnormal results are displayed) Labs Reviewed - No data to display  EKG None  Radiology Dg Knee Complete 4 Views Left  Result Date: 12/03/2018 CLINICAL DATA:  Fall with patellar pain.  Initial encounter. EXAM: LEFT KNEE - COMPLETE 4+ VIEW COMPARISON:  None. FINDINGS: No evidence  of fracture, dislocation, or joint effusion. There may be anterior soft tissue swelling. Incidental Enthesophytes along the extensor mechanism. IMPRESSION: No acute osseous finding. Electronically Signed   By: Monte Fantasia M.D.   On: 12/03/2018 10:12   Dg Knee Complete 4 Views Right  Result Date: 12/03/2018 CLINICAL DATA:  Fall yesterday with right knee pain. EXAM: RIGHT KNEE - COMPLETE 4+ VIEW COMPARISON:  None. FINDINGS: No evidence of fracture, dislocation, or joint effusion. No evidence of arthropathy or other focal bone abnormality. Soft tissues are unremarkable. IMPRESSION: Negative. Electronically Signed   By: Marin Olp M.D.   On: 12/03/2018 10:13   Dg Hand Complete Right  Result Date: 12/03/2018 CLINICAL DATA:  Fall.  Hand pain EXAM: RIGHT HAND - COMPLETE 3+ VIEW COMPARISON:  None. FINDINGS: There is no evidence of fracture or dislocation. There is no evidence of arthropathy or other focal bone abnormality. Soft tissues are unremarkable. IMPRESSION: Negative. Electronically Signed   By: Rolm Baptise M.D.   On: 12/03/2018 10:14    Procedures Procedures (including critical care time)  Medications Ordered in ED Medications - No data to display   Initial Impression / Assessment and Plan / ED Course  I have reviewed the triage vital signs and the nursing notes.  Pertinent labs & imaging results that were available during my care of the patient were reviewed by me and considered in my  medical decision making (see chart for details).  48 year old male presents with a fall last night with right hand and bilateral knee pain.  He is hypertensive but otherwise vital signs are normal.  He has well-documented uncontrolled hypertension due to noncompliance.  On exam he has mild diffuse swelling of the hand.  He has several skin abrasions but nothing that would require sutures today.  He has decreased range of motion of the middle and ring fingers.  Bilateral knees show mild anterior abrasions and decreased range of motion.  X-rays of the areas were obtained and are negative.  He was encouraged to take Tylenol, rest, ice, elevate the areas and to follow-up with his PCP.  Final Clinical Impressions(s) / ED Diagnoses   Final diagnoses:  Fall, initial encounter  Injury of right hand, initial encounter  Acute pain of both knees    ED Discharge Orders    None       Recardo Evangelist, PA-C 12/03/18 1046    Isla Pence, MD 12/03/18 1106

## 2018-12-03 NOTE — ED Triage Notes (Signed)
Pt in stating he tripped and fell yesterday walking out of a store, denies hitting head or LOC, c/o pain to bilateral knees and right hand

## 2018-12-07 ENCOUNTER — Other Ambulatory Visit: Payer: Self-pay | Admitting: *Deleted

## 2018-12-07 ENCOUNTER — Encounter (HOSPITAL_COMMUNITY): Payer: Self-pay | Admitting: Physician Assistant

## 2018-12-10 ENCOUNTER — Ambulatory Visit (INDEPENDENT_AMBULATORY_CARE_PROVIDER_SITE_OTHER): Payer: Medicare HMO | Admitting: Pharmacist Clinician (PhC)/ Clinical Pharmacy Specialist

## 2018-12-10 DIAGNOSIS — I10 Essential (primary) hypertension: Secondary | ICD-10-CM

## 2018-12-10 NOTE — Assessment & Plan Note (Signed)
Patient with uncontrolled mixed systolic/diastolic hypertension.  He continues to have issues with compliance.  We had a long discussion (20+ minutes) just discussing the dangers of longstanding hypertension.  He is already nearing need for dialysis.  I was blunt about the long term damage he is causing himself because of his lack of compliance, including dialysis (he's already close to this), heart failure, MI, and stroke, as well as personal things like not being able to see his children grow up and become successful adults.  He is to purchase two 7-day pill minders and put all his AM meds in one, in the kitchen, to take when he gets up to make breakfast for his 4 kids.  His other should be in the bathroom next to his toothbrush, for night time medications.  We will see him back in 2 weeks, to see if he has been able to accomplish this.

## 2018-12-10 NOTE — Patient Instructions (Addendum)
Return for a a follow up appointment on March 17  Go out and purchase (if you can) TWO seven day pill minders.  Put one in the kitchen next to the stove or refrigerator for your morning med and put the other in the bathroom next to your toothbrush for your evening meds.  Your blood pressure today is 168/110  (First goal is to get bottom number - diastolic - < 235)  Take your BP meds as follows:  AM:  Levothyroxine, glipizide, carvedilol, torsemide  PM:  Amlodipine, atorvastatin, carvedilol  Bring all of your meds, your BP cuff and your record of home blood pressures to your next appointment.  Exercise as you're able, try to walk approximately 30 minutes per day.  Keep salt intake to a minimum, especially watch canned and prepared boxed foods.  Eat more fresh fruits and vegetables and fewer canned items.  Avoid eating in fast food restaurants.    HOW TO TAKE YOUR BLOOD PRESSURE: . Rest 5 minutes before taking your blood pressure. .  Don't smoke or drink caffeinated beverages for at least 30 minutes before. . Take your blood pressure before (not after) you eat. . Sit comfortably with your back supported and both feet on the floor (don't cross your legs). . Elevate your arm to heart level on a table or a desk. . Use the proper sized cuff. It should fit smoothly and snugly around your bare upper arm. There should be enough room to slip a fingertip under the cuff. The bottom edge of the cuff should be 1 inch above the crease of the elbow. . Ideally, take 3 measurements at one sitting and record the average.

## 2018-12-10 NOTE — Progress Notes (Signed)
HPI:  Edward LARUE Sr. is a 48 y.o. male patient of Dr Harrell Gave, with a Emily below who presents today for hypertension clinic follow up.  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  He was originally seen in cardiology for chest pain, however a stress test  showed no cardiac cause of this pain.   His biggest challenge is non-compliance.  At each visit he admits to consistently missing at least 1/2 of his medication doses.  He states he has tried various ways or remembering, but has yet to be successful.  Part of this is that he believes some of his medications make him drowsy and he has a very busy lifestyle, despite being on disability.    Blood Pressure Goal:  130/80  Current Medications:  Amlodipine 10 mg in evening  Carvedilol 3.125mg  twice daily  Torsemide 100 mg daily   Family Hx:  Father died when infant, mother when 67.    One full sister also with hypertension, DM, now 61  Social Hx:  Smokes, 1 ppw - mini cigars; has cut way back on alcohol, 1 beer/week these days at most; rarely drinks caffeine unless he is feeling under the weather  Diet:  Not much appetite recently, only grazing - admits to missing meals daily, although he cooks for his kids daily  Exercise:  No regular exercise - since feet swelling  Home BP readings:  No home BP cuff Intolerances:   Avoid ACEI/ARB, spironolactone and thiazides due to current kidney function.  Would like to use low dose ACE due to DM, but will have to watch kidney function - patient scheduled for fistula placement soon.     Labs:  08/2018:  Na 143, K 4.2, Glu 140, BUN 36, SCr 4.01;  TSH 88.6  Wt Readings from Last 3 Encounters:  12/10/18 229 lb (103.9 kg)  11/21/18 242 lb (109.8 kg)  11/19/18 242 lb (109.8 kg)   BP Readings from Last 3 Encounters:  12/10/18 (!) 168/110  12/03/18 (!) 175/113  11/30/18  (!) 156/104   Pulse Readings from Last 3 Encounters:  12/10/18 72  12/03/18 88  11/30/18 76    Current Outpatient Medications  Medication Sig Dispense Refill  . amLODipine (NORVASC) 10 MG tablet Take 1 tablet (10 mg total) by mouth daily. 30 tablet 6  . atorvastatin (LIPITOR) 20 MG tablet Take 1 tablet (20 mg total) by mouth daily. 30 tablet 6  . carvedilol (COREG) 3.125 MG tablet Take 1 tablet (3.125 mg total) by mouth 2 (two) times daily. 180 tablet 3  . diclofenac sodium (VOLTAREN) 1 % GEL Apply 2 g topically 3 (three) times daily as needed (arthritic pain).    Marland Kitchen glipiZIDE (GLUCOTROL) 5 MG tablet Take 0.5 tablets (2.5 mg total) by mouth daily before breakfast. (Patient taking differently: Take 5 mg by mouth daily before breakfast. ) 15 tablet 6  . levothyroxine (SYNTHROID, LEVOTHROID) 112 MCG tablet Take 1 tablet (112 mcg total) by mouth daily. 60 tablet 0  . torsemide (DEMADEX) 100 MG tablet Take 100 mg by mouth daily.  6   No current facility-administered medications for this visit.     No Known Allergies  Past Medical History:  Diagnosis Date  . Anemia   . Arthritis   . Chronic kidney disease    stage 5  . Diabetes mellitus   . Graves disease 2014  .  Headache   . Hypertension   . Non-compliance   . Shortness of breath dyspnea     Blood pressure (!) 168/110, pulse 72, height 6' (1.829 m), weight 229 lb (103.9 kg).  Uncontrolled hypertension Patient with uncontrolled mixed systolic/diastolic hypertension.  He continues to have issues with compliance.  We had a long discussion (20+ minutes) just discussing the dangers of longstanding hypertension.  He is already nearing need for dialysis.  I was blunt about the long term damage he is causing himself because of his lack of compliance, including dialysis (he's already close to this), heart failure, MI, and stroke, as well as personal things like not being able to see his children grow up and become successful adults.  He is  to purchase two 7-day pill minders and put all his AM meds in one, in the kitchen, to take when he gets up to make breakfast for his 4 kids.  His other should be in the bathroom next to his toothbrush, for night time medications.  We will see him back in 2 weeks, to see if he has been able to accomplish this.     Tommy Medal PharmD CPP [facility] [address] [place] [address] 12/10/2018 11:19 AM

## 2018-12-17 ENCOUNTER — Inpatient Hospital Stay (HOSPITAL_COMMUNITY): Admission: RE | Admit: 2018-12-17 | Payer: Medicare HMO | Source: Ambulatory Visit

## 2018-12-21 ENCOUNTER — Telehealth: Payer: Self-pay | Admitting: Pharmacist Clinician (PhC)/ Clinical Pharmacy Specialist

## 2018-12-21 ENCOUNTER — Ambulatory Visit: Payer: Medicare Other | Admitting: Internal Medicine

## 2018-12-21 MED ORDER — HYDRALAZINE HCL 50 MG PO TABS: 50.0000 mg | ORAL_TABLET | Freq: Two times a day (BID) | ORAL | 3 refills | Status: DC

## 2018-12-21 NOTE — Telephone Encounter (Signed)
   Cardiac Questionnaire:    Since your last visit or hospitalization:    1. Have you been having new or worsening chest pain? No   2. Have you been having new or worsening shortness of breath? No 3. Have you been having new or worsening leg swelling, wt gain, or increase in abdominal girth (pants fitting more tightly)? YES   4. Have you had any passing out spells? No    *A YES to any of these questions would result in the appointment being kept. *If all the answers to these questions are NO, we should indicate that given the current situation regarding the worldwide coronarvirus pandemic, at the recommendation of the CDC, we are looking to limit gatherings in our waiting area, and thus will reschedule their appointment beyond four weeks from today.   _____________   Called patient to cancel appointment for 3.24.2020.  He notes that since he started taking his meds daily he has noted edema in his lower extremities.  Occurs around the clock, not improving overnight.   Patient is pre-dialysis (Scr 4.4 and GFR 17) and unable to use ACEI, ARB, or diuretics.  Will have him discontinue amlodipine and start hydralazine 50 mg bid.  Our challenge with him is that he does not have a home BP cuff, so will have to depend on any other medical visits and symptoms.

## 2018-12-24 MED FILL — hydrALAZINE HCL 50 MG TABS: 50 | 30 days supply | Qty: 60 | Fill #0

## 2018-12-25 ENCOUNTER — Ambulatory Visit: Payer: Medicare HMO

## 2018-12-28 MED FILL — LEVOTHYROXINE 112 MCG TAB: 112 | 30 days supply | Qty: 30 | Fill #0

## 2018-12-31 ENCOUNTER — Encounter (HOSPITAL_COMMUNITY): Admission: RE | Payer: Self-pay | Source: Home / Self Care

## 2018-12-31 ENCOUNTER — Ambulatory Visit (HOSPITAL_COMMUNITY): Admission: RE | Admit: 2018-12-31 | Payer: Medicare HMO | Source: Home / Self Care | Admitting: Vascular Surgery

## 2018-12-31 SURGERY — ARTERIOVENOUS (AV) FISTULA CREATION
Anesthesia: Monitor Anesthesia Care | Laterality: Right

## 2019-01-14 ENCOUNTER — Encounter (HOSPITAL_COMMUNITY): Payer: Medicare HMO

## 2019-01-16 ENCOUNTER — Telehealth: Payer: Self-pay | Admitting: Pharmacist Clinician (PhC)/ Clinical Pharmacy Specialist

## 2019-01-16 NOTE — Telephone Encounter (Signed)
Called patient to follow up now that he is off amlodipine and on hydralazine.  He reports that he has not been able [facility], but they told him he should have received his hydralazine by last Friday.  He has stopped amlodipine but has nothing in it's place for about 3 weeks now.    Unfortunately he does not have a home BP cuff to determine what his readings are, nor has he been to any medical appointments in person since the [place] outbreak.  I have reached out to another PharmD who can hopefully get me access to the [facility] so that I can determine where the problem is in getting medication to patient.

## 2019-01-17 ENCOUNTER — Telehealth: Payer: Self-pay

## 2019-01-17 NOTE — Telephone Encounter (Signed)
Attempted to contact pt to change appointment to virtual visit. Left message to call back.

## 2019-01-18 NOTE — Telephone Encounter (Signed)
Spoke with PharmD at [facility]) who researched and determined that med was sent to wrong apartment.  He spoke with patient and arranged for patient to come to [facility] within a day to get medication.

## 2019-01-22 NOTE — Telephone Encounter (Signed)
Attempted to contact pt x 2 to change appointment to virtual visit. Left message to call back.

## 2019-01-23 NOTE — Telephone Encounter (Signed)
Attempted pt contact pt x3 to change appointment to virtual visit. Left message to call back.

## 2019-01-24 ENCOUNTER — Encounter: Payer: Self-pay | Admitting: Cardiology

## 2019-01-24 ENCOUNTER — Telehealth (INDEPENDENT_AMBULATORY_CARE_PROVIDER_SITE_OTHER): Payer: Medicare HMO | Admitting: Cardiology

## 2019-01-24 ENCOUNTER — Other Ambulatory Visit: Payer: Self-pay

## 2019-01-24 DIAGNOSIS — I5032 Chronic diastolic (congestive) heart failure: Secondary | ICD-10-CM

## 2019-01-24 DIAGNOSIS — Z7189 Other specified counseling: Secondary | ICD-10-CM

## 2019-01-24 DIAGNOSIS — I110 Hypertensive heart disease with heart failure: Secondary | ICD-10-CM

## 2019-01-24 DIAGNOSIS — I10 Essential (primary) hypertension: Secondary | ICD-10-CM

## 2019-01-24 DIAGNOSIS — N185 Chronic kidney disease, stage 5: Secondary | ICD-10-CM | POA: Diagnosis not present

## 2019-01-24 NOTE — Patient Instructions (Signed)

## 2019-01-24 NOTE — Telephone Encounter (Signed)

## 2019-01-24 NOTE — Progress Notes (Signed)
That would be great if we could get him a home cuff.  He is one that really got hurt by [place] not being able to bring them into the office.  I'll touch base with him in 2-3 weeks.

## 2019-01-24 NOTE — Progress Notes (Signed)
Virtual Visit via Video Note   This visit type was conducted due to national recommendations for restrictions regarding the COVID-19 Pandemic (e.g. social distancing) in an effort to limit this patient's exposure and mitigate transmission in our community.  Due to his co-morbid illnesses, this patient is at least at moderate risk for complications without adequate follow up.  This format is felt to be most appropriate for this patient at this time.  All issues noted in this document were discussed and addressed.  A limited physical exam was performed with this format.  Please refer to the patient's chart for his consent to telehealth for [facility].   Evaluation Performed:  Follow-up visit  Date:  01/24/2019   ID:  Edward [name], DOB Feb 08, 1971, MRN [id number]  Patient Location: Home Provider Location: Home  PCP:  [name], MD  Cardiologist:  Buford Dresser, MD  Electrophysiologist:  None   Chief Complaint:  Follow up   History of Present Illness:    Edward [name] is a 48 y.o. male with hx of hypertension, type II diabetes with neuropathy, graves disease s/p radioactive iodine, chronic kidney disease stage 5 with planned hemodialysis access, erectile dysfunction.  The patient does not have symptoms concerning for COVID-19 infection (fever, chills, cough, or new shortness of breath).   Attempted video call x2. Unable to do due to technical issues, changed to telephone visit.  Overall doing well. No chest pain. Rare occasional shortness of breath, mild. No scale to weigh himself.   Reviewed hypertension medications, issues with receiving. He now has meds but has not started, plans to take first doses today. We discussed the need to control his blood pressure given his diabetes and kidney disease. He has already met with vascular surgery and is discussing timing of fistula. We discussed that the better we control his blood pressure, the more  potential we have to delay dialysis.   He is social distancing appropriately. Has not been able to exercise, diet has been tough. Feels like he might have gained some caloric weight.  Denies chest pain. No PND, orthopnea, LE edema or unexpected weight gain. No syncope or palpitations.  Past Medical History:  Diagnosis Date  . Anemia   . Arthritis   . Chronic kidney disease    stage 5  . Diabetes mellitus   . Graves disease 2014  . Headache   . Hypertension   . Non-compliance   . Shortness of breath dyspnea    Past Surgical History:  Procedure Laterality Date  . CIRCUMCISION     as a child, around 63 years of age  . EYE SURGERY    . PARS PLANA VITRECTOMY Right 03/25/2016   Procedure: PARS PLANA VITRECTOMY 25 GAUGE FOR ENDOPHTHALMITIS;  Surgeon: Jalene Mullet, MD;  Location: [place];  Service: Ophthalmology;  Laterality: Right;  . WISDOM TOOTH EXTRACTION       Current Meds  Medication Sig  . atorvastatin (LIPITOR) 20 MG tablet Take 1 tablet (20 mg total) by mouth daily.  . calcitRIOL (ROCALTROL) 0.5 MCG capsule Take 0.5 mcg by mouth daily.  . carvedilol (COREG) 3.125 MG tablet Take 1 tablet (3.125 mg total) by mouth 2 (two) times daily.  . diclofenac sodium (VOLTAREN) 1 % GEL Apply 2 g topically 3 (three) times daily as needed (arthritic pain).  Marland Kitchen glipiZIDE (GLUCOTROL) 5 MG tablet Take 0.5 tablets (2.5 mg total) by mouth daily before breakfast. (Patient taking differently: Take 5 mg by mouth daily  before breakfast. )  . hydrALAZINE (APRESOLINE) 50 MG tablet Take 1 tablet (50 mg total) by mouth 2 (two) times daily.  Marland Kitchen levothyroxine (SYNTHROID, LEVOTHROID) 112 MCG tablet Take 1 tablet (112 mcg total) by mouth daily.  Marland Kitchen torsemide (DEMADEX) 100 MG tablet Take 100 mg by mouth daily.     Allergies:   Patient has no known allergies.   Social History   Tobacco Use  . Smoking status: Former Smoker    Packs/day: 0.75    Years: 3.00    Pack years: 2.25    Types: Cigarettes    Last  attempt to quit: 05/28/2000    Years since quitting: 18.6  . Smokeless tobacco: Never Used  Substance Use Topics  . Alcohol use: Yes    Alcohol/week: 0.0 standard drinks    Comment: 1 beer every night  . Drug use: No     Family Hx: The patient's family history includes Diabetes in his mother; Hypertension in his mother.  ROS:   Please see the history of present illness.    Constitutional: Negative for chills, fever, night sweats, unintentional weight loss  HENT: Negative for ear pain and hearing loss.   Eyes: Negative for loss of vision and eye pain.  Respiratory: Negative for cough, sputum, wheezing.   Cardiovascular: See HPI. Gastrointestinal: Negative for abdominal pain, melena, and hematochezia.  Genitourinary: Negative for dysuria and hematuria.  Musculoskeletal: Negative for falls and myalgias.  Skin: Negative for itching and rash.  Neurological: Negative for focal weakness, focal sensory changes and loss of consciousness.  Endo/Heme/Allergies: Does not bruise/bleed easily.  All other systems reviewed and are negative.   Prior CV studies:   The following studies were reviewed today: Recent stress test, echo  Labs/Other Tests and Data Reviewed:    EKG:  An ECG dated 08/20/18 was personally reviewed today and demonstrated:  NSR, nonspecific TW changes  Recent Labs: 03/21/2018: ALT 37; Platelets 220 08/20/2018: BUN 36; Creatinine, Ser 4.01; Potassium 4.2; Sodium 143 10/22/2018: TSH >49.10 11/19/2018: Hemoglobin 9.6   Recent Lipid Panel Lab Results  Component Value Date/Time   CHOL 242 (H) 06/11/2018 08:54 AM   TRIG 107 06/11/2018 08:54 AM   HDL 84 06/11/2018 08:54 AM   CHOLHDL 2.9 06/11/2018 08:54 AM   CHOLHDL 2.8 09/19/2016 10:23 AM   LDLCALC 137 (H) 06/11/2018 08:54 AM    Wt Readings from Last 3 Encounters:  12/10/18 229 lb (103.9 kg)  11/21/18 242 lb (109.8 kg)  11/19/18 242 lb (109.8 kg)     Objective:    Vital Signs:  There were no vitals taken for  this visit.    ASSESSMENT & PLAN:   Uncontrolled hypertension  Chronic diastolic heart failure (HCC)  Stage 5 chronic kidney disease not on chronic dialysis ([place])  Educated About Covid-19 Virus Infection  1. Uncontrolled hypertension: this continues to be a significant challenge. Has had issues with medication adherence. Has not been taking medications in several weeks, see documentation re: delivery issues. Plans to start today. Does not have home BP cuff to track readings -appreciate close management by Tommy Medal in [facility] clinic. -we will look into how to get him a home BP cuff--will reach out to social work to see if we can get this for him. Otherwise might benefit from weekly home health visits for vitals and med assistance -discussed extensively how important it is to manage his blood pressure given his comorbid conditions   2. Chronic diastolic heart failure: no symptoms, no  scale at home 3. Stage V CKD, pre dialysis: planned for fistula  COVID-19 Education: The signs and symptoms of COVID-19 were discussed with the patient and how to seek care for testing (follow up with PCP or arrange E-visit).  The importance of social distancing was discussed today.  Time:   Today, I have spent 16 minutes with the patient with telehealth technology discussing the above problems.    Patient Instructions  Medication Instructions:  Your Physician recommend you continue on your current medication as directed.    If you need a refill on your cardiac medications before your next appointment, please call your pharmacy.   Lab work: None  Testing/Procedures: None  Follow-Up: At [facility], you and your health needs are our priority.  As part of our continuing mission to provide you with exceptional heart care, we have created designated Provider Care Teams.  These Care Teams include your primary Cardiologist (physician) and Advanced Practice Providers (APPs -  Physician  Assistants and Nurse Practitioners) who all work together to provide you with the care you need, when you need it. You will need a follow up appointment in 3 months.  Please call our office 2 months in advance to schedule this appointment.  You may see Buford Dresser, MD or one of the following Advanced Practice Providers on your designated Care Team:   Rosaria Ferries, PA-C . Jory Sims, DNP, ANP       Medication Adjustments/Labs and Tests Ordered: Current medicines are reviewed at length with the patient today.  Concerns regarding medicines are outlined above.   Tests Ordered: No orders of the defined types were placed in this encounter.   Medication Changes: No orders of the defined types were placed in this encounter.   Disposition:  Follow up: will try to get him a home BP cuff vs. Home visit, check in with HTN clinic in 3-4 weeks, check in with me in 3 months.  Signed, Buford Dresser, MD  01/24/2019 9:45 AM    [place]

## 2019-01-30 ENCOUNTER — Ambulatory Visit: Payer: Medicare Other | Admitting: Family Medicine

## 2019-02-14 ENCOUNTER — Telehealth: Payer: Self-pay | Admitting: Pharmacist Clinician (PhC)/ Clinical Pharmacy Specialist

## 2019-02-14 NOTE — Telephone Encounter (Signed)
LMOM for patient.  Checking in to be sure he has all medications.  Also need to determine if he has home BP cuff.  If not, will contact Raquel Sarna at [facility] clinic to see if she can make arrangements for patient to get cuff at no charge.

## 2019-03-15 NOTE — Telephone Encounter (Signed)
Talked with patient.  He is taking meds, but admits not quite as regularly as he should.  Notes that he gets some drowsiness and thinks could be one of the meds.  He does not have a home BP cuff, nor the means to purchase.  Will reach out to Raquel Sarna LCSW to see if we can arrange for him to get a free cuff.  This will give [place] a better chance to adjust meds to help with compliance.    Patient agreeable for [place] to contact SW

## 2019-03-18 ENCOUNTER — Telehealth: Payer: Self-pay | Admitting: Licensed Clinical Social Worker

## 2019-03-18 NOTE — Telephone Encounter (Signed)
CSW referred to assist patient with obtaining a BP cuff. CSW contacted patient to inform cuff will be delivered to home on Wednesday. Patient grateful for support and assistance. CSW available as needed. Raquel Sarna, [place], [place]

## 2019-03-19 ENCOUNTER — Telehealth: Payer: Self-pay | Admitting: Licensed Clinical Social Worker

## 2019-03-19 NOTE — Telephone Encounter (Signed)
CSW received confirmation form [employer] and confirmed with patient that he received his BP cuff. Patient verbalizes understanding and receipt. CSW available as needed. Raquel Sarna, [place], [place]

## 2019-03-29 DIAGNOSIS — N189 Chronic kidney disease, unspecified: Secondary | ICD-10-CM | POA: Diagnosis not present

## 2019-03-29 DIAGNOSIS — D631 Anemia in chronic kidney disease: Secondary | ICD-10-CM | POA: Diagnosis not present

## 2019-03-29 DIAGNOSIS — I129 Hypertensive chronic kidney disease with stage 1 through stage 4 chronic kidney disease, or unspecified chronic kidney disease: Secondary | ICD-10-CM | POA: Diagnosis not present

## 2019-03-29 DIAGNOSIS — N2581 Secondary hyperparathyroidism of renal origin: Secondary | ICD-10-CM | POA: Diagnosis not present

## 2019-03-29 DIAGNOSIS — Z9114 Patient's other noncompliance with medication regimen: Secondary | ICD-10-CM | POA: Diagnosis not present

## 2019-03-29 DIAGNOSIS — N184 Chronic kidney disease, stage 4 (severe): Secondary | ICD-10-CM | POA: Diagnosis not present

## 2019-04-03 ENCOUNTER — Telehealth: Payer: Self-pay | Admitting: *Deleted

## 2019-04-03 NOTE — Telephone Encounter (Signed)
Received call from [facility] at Dr. Louie Boston office, patient had GFR of 13 on 03-29-19 and is now wanting to schedule his access surgery; Originally posted for 12-31-18 for right arm AVF vs AVG by Dr. Scot Dock. I will contact patient and reschedule this.

## 2019-04-09 ENCOUNTER — Other Ambulatory Visit: Payer: Self-pay

## 2019-04-17 ENCOUNTER — Other Ambulatory Visit: Payer: Self-pay

## 2019-04-17 ENCOUNTER — Ambulatory Visit (HOSPITAL_COMMUNITY)
Admission: RE | Admit: 2019-04-17 | Discharge: 2019-04-17 | Disposition: A | Discharge status: Home / Self Care | Payer: Medicare HMO | Source: Ambulatory Visit | Attending: Nephrology | Admitting: Nephrology

## 2019-04-17 VITALS — BP 159/93 | HR 85 | Temp 97.2°F | Resp 18

## 2019-04-17 DIAGNOSIS — N189 Chronic kidney disease, unspecified: Secondary | ICD-10-CM | POA: Diagnosis not present

## 2019-04-17 DIAGNOSIS — N179 Acute kidney failure, unspecified: Secondary | ICD-10-CM | POA: Insufficient documentation

## 2019-04-17 DIAGNOSIS — N184 Chronic kidney disease, stage 4 (severe): Secondary | ICD-10-CM | POA: Diagnosis not present

## 2019-04-17 LAB — POCT HEMOGLOBIN-HEMACUE: Hemoglobin: 9.1 g/dL — ABNORMAL LOW (ref 13.0–17.0)

## 2019-04-17 MED ORDER — EPOETIN ALFA-EPBX 10000 UNIT/ML IJ SOLN
15000.0000 [IU] | INTRAMUSCULAR | Status: DC
  Administered 2019-04-17: 10:00:00 15000 [IU] via SUBCUTANEOUS
  Filled 2019-04-17: qty 2

## 2019-04-18 ENCOUNTER — Other Ambulatory Visit: Payer: Self-pay

## 2019-04-18 ENCOUNTER — Encounter (HOSPITAL_COMMUNITY): Payer: Self-pay | Admitting: *Deleted

## 2019-04-18 ENCOUNTER — Other Ambulatory Visit (HOSPITAL_COMMUNITY)
Admission: RE | Admit: 2019-04-18 | Discharge: 2019-04-18 | Disposition: A | Discharge status: Home / Self Care | Payer: Medicare HMO | Source: Ambulatory Visit | Attending: Vascular Surgery | Admitting: Vascular Surgery

## 2019-04-18 DIAGNOSIS — Z1159 Encounter for screening for other viral diseases: Secondary | ICD-10-CM | POA: Diagnosis not present

## 2019-04-18 LAB — SARS CORONAVIRUS 2 (TAT 6-24 HRS): SARS Coronavirus 2: NEGATIVE

## 2019-04-18 NOTE — Progress Notes (Signed)
Mr Edward [name] denies chest pain or shortness of breath. Mr Edward [name] reports that he has been taking medications ar e prescribed and he is not having shortness of breath and blood pressure has improved. Patient reports BP averages 150's /90's. Mr. Edward [name] does not check CBGs, not sure where machine is. I instructed patient to hold glipizid the morning of surgery.

## 2019-04-19 ENCOUNTER — Telehealth: Payer: Self-pay | Admitting: *Deleted

## 2019-04-19 NOTE — Telephone Encounter (Signed)
Call to patient. Instructed to be at [facility] admitting at 5:30 am on 04/22/19 for first case surgery with Dr. Scot Dock. All other instructions unchanged. Verbalized understanding.

## 2019-04-21 NOTE — Anesthesia Preprocedure Evaluation (Addendum)
Anesthesia Evaluation  Patient identified by MRN, date of birth, ID band Patient awake    Reviewed: Allergy & Precautions, NPO status , Patient's Chart, lab work & pertinent test results, reviewed documented beta blocker date and time   Airway Mallampati: III  TM Distance: >3 FB Neck ROM: Full    Dental  (+) Missing   Pulmonary former smoker,    Pulmonary exam normal breath sounds clear to auscultation       Cardiovascular hypertension, Pt. on medications and Pt. on home beta blockers +CHF  Normal cardiovascular exam Rhythm:Regular Rate:Normal     Neuro/Psych  Headaches, PSYCHIATRIC DISORDERS Depression    GI/Hepatic negative GI ROS, Neg liver ROS,   Endo/Other  diabetes, Oral Hypoglycemic AgentsHypothyroidism   Renal/GU ESRFRenal disease     Musculoskeletal negative musculoskeletal ROS (+)   Abdominal (+) + obese,   Peds  Hematology  (+) anemia , HLD   Anesthesia Other Findings CHRONIC KIDNEY STAGE 4  Reproductive/Obstetrics                           Anesthesia Physical Anesthesia Plan  ASA: III  Anesthesia Plan: MAC   Post-op Pain Management:    Induction: Intravenous  PONV Risk Score and Plan: 1 and Propofol infusion, Treatment may vary due to age or medical condition, Ondansetron and Dexamethasone  Airway Management Planned: Simple Face Mask  Additional Equipment:   Intra-op Plan:   Post-operative Plan:   Informed Consent: I have reviewed the patients History and Physical, chart, labs and discussed the procedure including the risks, benefits and alternatives for the proposed anesthesia with the patient or authorized representative who has indicated his/her understanding and acceptance.     Dental advisory given  Plan Discussed with: CRNA  Anesthesia Plan Comments:        Anesthesia Quick Evaluation

## 2019-04-22 ENCOUNTER — Other Ambulatory Visit: Payer: Self-pay

## 2019-04-22 ENCOUNTER — Ambulatory Visit (HOSPITAL_COMMUNITY)
Admission: RE | Admit: 2019-04-22 | Discharge: 2019-04-22 | Disposition: A | Discharge status: Home / Self Care | Payer: Medicare HMO | Attending: Vascular Surgery | Admitting: Vascular Surgery

## 2019-04-22 ENCOUNTER — Encounter (HOSPITAL_COMMUNITY): Payer: Self-pay

## 2019-04-22 ENCOUNTER — Ambulatory Visit (HOSPITAL_COMMUNITY): Payer: Medicare HMO | Admitting: Anesthesiology

## 2019-04-22 ENCOUNTER — Encounter (HOSPITAL_COMMUNITY)
Admission: RE | Disposition: A | Discharge status: Home / Self Care | Payer: Self-pay | Source: Home / Self Care | Attending: Vascular Surgery

## 2019-04-22 DIAGNOSIS — E785 Hyperlipidemia, unspecified: Secondary | ICD-10-CM | POA: Diagnosis not present

## 2019-04-22 DIAGNOSIS — E039 Hypothyroidism, unspecified: Secondary | ICD-10-CM | POA: Diagnosis not present

## 2019-04-22 DIAGNOSIS — Z79899 Other long term (current) drug therapy: Secondary | ICD-10-CM | POA: Insufficient documentation

## 2019-04-22 DIAGNOSIS — Z87891 Personal history of nicotine dependence: Secondary | ICD-10-CM | POA: Insufficient documentation

## 2019-04-22 DIAGNOSIS — Z7984 Long term (current) use of oral hypoglycemic drugs: Secondary | ICD-10-CM | POA: Insufficient documentation

## 2019-04-22 DIAGNOSIS — E1122 Type 2 diabetes mellitus with diabetic chronic kidney disease: Secondary | ICD-10-CM | POA: Diagnosis not present

## 2019-04-22 DIAGNOSIS — I509 Heart failure, unspecified: Secondary | ICD-10-CM | POA: Insufficient documentation

## 2019-04-22 DIAGNOSIS — Z79890 Hormone replacement therapy (postmenopausal): Secondary | ICD-10-CM | POA: Diagnosis not present

## 2019-04-22 DIAGNOSIS — N184 Chronic kidney disease, stage 4 (severe): Secondary | ICD-10-CM | POA: Diagnosis not present

## 2019-04-22 DIAGNOSIS — I130 Hypertensive heart and chronic kidney disease with heart failure and stage 1 through stage 4 chronic kidney disease, or unspecified chronic kidney disease: Secondary | ICD-10-CM | POA: Insufficient documentation

## 2019-04-22 HISTORY — PX: AV FISTULA PLACEMENT: SHX1204

## 2019-04-22 HISTORY — DX: Hypothyroidism, unspecified: E03.9

## 2019-04-22 HISTORY — DX: Polyneuropathy, unspecified: G62.9

## 2019-04-22 HISTORY — DX: Legal blindness, as defined in USA: H54.8

## 2019-04-22 HISTORY — DX: Depression, unspecified: F32.A

## 2019-04-22 HISTORY — DX: Heart failure, unspecified: I50.9

## 2019-04-22 HISTORY — DX: Unspecified visual loss: H54.7

## 2019-04-22 LAB — GLUCOSE, CAPILLARY
Glucose-Capillary: 236 mg/dL — ABNORMAL HIGH (ref 70–99)
Glucose-Capillary: 209 mg/dL — ABNORMAL HIGH (ref 70–99)

## 2019-04-22 LAB — POCT I-STAT 4, (NA,K, GLUC, HGB,HCT)
Glucose, Bld: 225 mg/dL — ABNORMAL HIGH (ref 70–99)
HCT: 26 % — ABNORMAL LOW (ref 39.0–52.0)
Hemoglobin: 8.8 g/dL — ABNORMAL LOW (ref 13.0–17.0)
Potassium: 4.5 mmol/L (ref 3.5–5.1)
Sodium: 139 mmol/L (ref 135–145)

## 2019-04-22 SURGERY — ARTERIOVENOUS (AV) FISTULA CREATION
Anesthesia: Monitor Anesthesia Care | Laterality: Right

## 2019-04-22 MED ORDER — SODIUM CHLORIDE 0.9 % IV SOLN
INTRAVENOUS | Status: DC | PRN
  Administered 2019-04-22: 08:00:00 500 mL

## 2019-04-22 MED ORDER — CEFAZOLIN SODIUM-DEXTROSE 2-4 GM/100ML-% IV SOLN
2.0000 g | INTRAVENOUS | Status: AC
  Administered 2019-04-22: 2 g via INTRAVENOUS

## 2019-04-22 MED ORDER — CARVEDILOL 3.125 MG PO TABS
ORAL_TABLET | ORAL | Status: AC
  Administered 2019-04-22: 3.125 mg via ORAL
  Filled 2019-04-22: qty 1

## 2019-04-22 MED ORDER — MIDAZOLAM HCL 2 MG/2ML IJ SOLN
INTRAMUSCULAR | Status: DC | PRN
  Administered 2019-04-22: 1 mg via INTRAVENOUS

## 2019-04-22 MED ORDER — PROPOFOL 10 MG/ML IV BOLUS
INTRAVENOUS | Status: AC
  Filled 2019-04-22: qty 20

## 2019-04-22 MED ORDER — DEXAMETHASONE SODIUM PHOSPHATE 10 MG/ML IJ SOLN
INTRAMUSCULAR | Status: AC
  Filled 2019-04-22: qty 1

## 2019-04-22 MED ORDER — PROTAMINE SULFATE 10 MG/ML IV SOLN
INTRAVENOUS | Status: DC | PRN
  Administered 2019-04-22: 50 mg via INTRAVENOUS

## 2019-04-22 MED ORDER — FENTANYL CITRATE (PF) 250 MCG/5ML IJ SOLN
INTRAMUSCULAR | Status: AC
  Filled 2019-04-22: qty 5

## 2019-04-22 MED ORDER — PROPOFOL 500 MG/50ML IV EMUL
INTRAVENOUS | Status: DC | PRN
  Administered 2019-04-22: 50 ug/kg/min via INTRAVENOUS

## 2019-04-22 MED ORDER — CARVEDILOL 3.125 MG PO TABS
3.1250 mg | ORAL_TABLET | Freq: Once | ORAL | Status: AC
  Administered 2019-04-22: 3.125 mg via ORAL

## 2019-04-22 MED ORDER — CHLORHEXIDINE GLUCONATE 4 % EX LIQD: 60.0000 mL | Freq: Once | CUTANEOUS | Status: DC

## 2019-04-22 MED ORDER — OXYCODONE HCL 5 MG PO TABS: 5.0000 mg | ORAL_TABLET | ORAL | 0 refills | Status: DC | PRN

## 2019-04-22 MED ORDER — ONDANSETRON HCL 4 MG/2ML IJ SOLN
INTRAMUSCULAR | Status: AC
  Filled 2019-04-22: qty 2

## 2019-04-22 MED ORDER — LIDOCAINE-EPINEPHRINE (PF) 1 %-1:200000 IJ SOLN
INTRAMUSCULAR | Status: AC
  Filled 2019-04-22: qty 30

## 2019-04-22 MED ORDER — LIDOCAINE 2% (20 MG/ML) 5 ML SYRINGE
INTRAMUSCULAR | Status: AC
  Filled 2019-04-22: qty 5

## 2019-04-22 MED ORDER — LABETALOL HCL 5 MG/ML IV SOLN
INTRAVENOUS | Status: DC | PRN
  Administered 2019-04-22 (×2): 5 mg via INTRAVENOUS

## 2019-04-22 MED ORDER — ACETAMINOPHEN 500 MG PO TABS
500.0000 mg | ORAL_TABLET | Freq: Once | ORAL | Status: AC
  Administered 2019-04-22: 500 mg via ORAL
  Filled 2019-04-22: qty 1

## 2019-04-22 MED ORDER — SODIUM CHLORIDE 0.9 % IV SOLN
INTRAVENOUS | Status: AC
  Filled 2019-04-22: qty 1.2

## 2019-04-22 MED ORDER — LIDOCAINE-EPINEPHRINE (PF) 1 %-1:200000 IJ SOLN
INTRAMUSCULAR | Status: DC | PRN
  Administered 2019-04-22: 25 mL

## 2019-04-22 MED ORDER — 0.9 % SODIUM CHLORIDE (POUR BTL) OPTIME
TOPICAL | Status: DC | PRN
  Administered 2019-04-22: 08:00:00 1000 mL

## 2019-04-22 MED ORDER — HEPARIN SODIUM (PORCINE) 1000 UNIT/ML IJ SOLN
INTRAMUSCULAR | Status: DC | PRN
  Administered 2019-04-22: 10000 [IU] via INTRAVENOUS

## 2019-04-22 MED ORDER — CEFAZOLIN SODIUM-DEXTROSE 2-4 GM/100ML-% IV SOLN
INTRAVENOUS | Status: AC
  Filled 2019-04-22: qty 100

## 2019-04-22 MED ORDER — SODIUM CHLORIDE 0.9 % IV SOLN
INTRAVENOUS | Status: DC
  Administered 2019-04-22 (×2): via INTRAVENOUS

## 2019-04-22 MED ORDER — ONDANSETRON HCL 4 MG/2ML IJ SOLN
INTRAMUSCULAR | Status: DC | PRN
  Administered 2019-04-22: 4 mg via INTRAVENOUS

## 2019-04-22 MED ORDER — LIDOCAINE HCL (PF) 1 % IJ SOLN
INTRAMUSCULAR | Status: AC
  Filled 2019-04-22: qty 30

## 2019-04-22 MED ORDER — HYDRALAZINE HCL 25 MG PO TABS
50.0000 mg | ORAL_TABLET | Freq: Once | ORAL | Status: DC
  Filled 2019-04-22: qty 2

## 2019-04-22 MED ORDER — SODIUM CHLORIDE 0.9 % IV SOLN
INTRAVENOUS | Status: DC | PRN
  Administered 2019-04-22: 25 ug/min via INTRAVENOUS

## 2019-04-22 MED ORDER — GLYCOPYRROLATE PF 0.2 MG/ML IJ SOSY
PREFILLED_SYRINGE | INTRAMUSCULAR | Status: DC | PRN
  Administered 2019-04-22: .2 mg via INTRAVENOUS

## 2019-04-22 MED ORDER — MIDAZOLAM HCL 2 MG/2ML IJ SOLN
INTRAMUSCULAR | Status: AC
  Filled 2019-04-22: qty 2

## 2019-04-22 SURGICAL SUPPLY — 35 items
ARMBAND PINK RESTRICT EXTREMIT (MISCELLANEOUS) ×3 IMPLANT
CANISTER SUCT 3000ML PPV (MISCELLANEOUS) ×2 IMPLANT
CANNULA VESSEL 3MM 2 BLNT TIP (CANNULA) ×2 IMPLANT
CLIP VESOCCLUDE MED 6/CT (CLIP) ×2 IMPLANT
CLIP VESOCCLUDE SM WIDE 6/CT (CLIP) ×3 IMPLANT
COVER PROBE W GEL 5X96 (DRAPES) IMPLANT
COVER WAND RF STERILE (DRAPES) ×1 IMPLANT
DECANTER SPIKE VIAL GLASS SM (MISCELLANEOUS) ×1 IMPLANT
DERMABOND ADVANCED (GAUZE/BANDAGES/DRESSINGS) ×1
DERMABOND ADVANCED .7 DNX12 (GAUZE/BANDAGES/DRESSINGS) ×1 IMPLANT
ELECT REM PT RETURN 9FT ADLT (ELECTROSURGICAL) ×2
ELECTRODE REM PT RTRN 9FT ADLT (ELECTROSURGICAL) ×1 IMPLANT
GLOVE BIO SURGEON STRL SZ 6.5 (GLOVE) ×1 IMPLANT
GLOVE BIO SURGEON STRL SZ7.5 (GLOVE) ×3 IMPLANT
GLOVE BIOGEL PI IND STRL 6.5 (GLOVE) IMPLANT
GLOVE BIOGEL PI IND STRL 8 (GLOVE) ×1 IMPLANT
GLOVE BIOGEL PI INDICATOR 6.5 (GLOVE) ×2
GLOVE BIOGEL PI INDICATOR 8 (GLOVE) ×1
GOWN STRL REUS W/ TWL LRG LVL3 (GOWN DISPOSABLE) ×3 IMPLANT
GOWN STRL REUS W/ TWL XL LVL3 (GOWN DISPOSABLE) IMPLANT
GOWN STRL REUS W/TWL LRG LVL3 (GOWN DISPOSABLE) ×4
GOWN STRL REUS W/TWL XL LVL3 (GOWN DISPOSABLE) ×1
KIT BASIN OR (CUSTOM PROCEDURE TRAY) ×2 IMPLANT
KIT TURNOVER KIT B (KITS) ×2 IMPLANT
NS IRRIG 1000ML POUR BTL (IV SOLUTION) ×2 IMPLANT
PACK CV ACCESS (CUSTOM PROCEDURE TRAY) ×2 IMPLANT
PAD ARMBOARD 7.5X6 YLW CONV (MISCELLANEOUS) ×4 IMPLANT
SPONGE SURGIFOAM ABS GEL 100 (HEMOSTASIS) IMPLANT
SUT PROLENE 6 0 BV (SUTURE) ×2 IMPLANT
SUT VIC AB 3-0 SH 27 (SUTURE) ×1
SUT VIC AB 3-0 SH 27X BRD (SUTURE) ×1 IMPLANT
SUT VICRYL 4-0 PS2 18IN ABS (SUTURE) ×2 IMPLANT
TOWEL GREEN STERILE (TOWEL DISPOSABLE) ×2 IMPLANT
UNDERPAD 30X30 (UNDERPADS AND DIAPERS) ×2 IMPLANT
WATER STERILE IRR 1000ML POUR (IV SOLUTION) ×2 IMPLANT

## 2019-04-22 NOTE — Anesthesia Procedure Notes (Signed)
Procedure Name: MAC Date/Time: 04/22/2019 7:55 AM Performed by: Lowella Dell, CRNA Pre-anesthesia Checklist: Patient identified, Emergency Drugs available, Suction available, Patient being monitored and Timeout performed Patient Re-evaluated:Patient Re-evaluated prior to induction Oxygen Delivery Method: Simple face mask Induction Type: IV induction Placement Confirmation: positive ETCO2 Dental Injury: Teeth and Oropharynx as per pre-operative assessment

## 2019-04-22 NOTE — Op Note (Signed)
    NAME: Edward NUON Sr.    MRN: [id number] DOB: October 17, 1970    DATE OF OPERATION: 04/22/2019  PREOP DIAGNOSIS:    Stage IV chronic kidney disease  POSTOP DIAGNOSIS:    Same  PROCEDURE:    Right brachiocephalic AV fistula  SURGEON: Judeth Cornfield. Scot Dock, MD, FACS  ASSIST: Arlee Muslim, PA  ANESTHESIA: Local with sedation  EBL: Minimal  INDICATIONS:    [name] is a 48 y.o. male who is not yet on dialysis.  He presents for new access.  FINDINGS:   4 mm upper arm cephalic vein.  TECHNIQUE:   The patient was taken to the operating room and sedated by anesthesia.  The right upper extremity was prepped and draped in usual sterile fashion.  After the skin was anesthetized with 1% lidocaine, a transverse incision was made above the antecubital level.  Here the cephalic vein was dissected free.  It was ligated distally and irrigated up with heparinized saline.  It appeared to be an adequate vein.  The brachial artery was dissected free beneath the fascia.  The patient was heparinized.  The brachial artery was clamped proximally and distally and a longitudinal arteriotomy was made.  The vein was sewn end-to-side to the artery using continuous 6-0 Prolene suture.  At the completion there was a good thrill in the fistula.  There was a radial and ulnar signal with a Doppler.  Hemostasis was obtained in the wound and the wound was closed with a deep layer of 3-0 Vicryl and the skin closed with 4-0 Vicryl.  The heparin had been partially reversed with protamine.  Sterile dressing was applied.  The patient tolerated the procedure well was transferred to the recovery room in stable condition.  All needle and sponge counts were correct.  Deitra Mayo, MD, FACS [facility]  DATE OF DICTATION:   04/22/2019

## 2019-04-22 NOTE — H&P (Signed)
REASON FOR CONSULT:    For right AVF or AVG  HPI:   Edward WILLETTE Sr. is a pleasant 48 y.o. male, who was referred for evaluation hemodialysis access.  I have reviewed the records from the referring office.  Patient has chronic kidney disease secondary to diabetes which is been poorly controlled.  He also has retinopathy with blindness.  In addition he has a history of noncompliance with medications.  Patient has noted some swelling recently but otherwise denies any recent uremic symptoms.  Specifically, he denies nausea, vomiting, fatigue, anorexia, or palpitations.  He is right-handed.      Past Medical History:  Diagnosis Date   Anemia    Arthritis    Chronic kidney disease    stage 5   Diabetes mellitus    Graves disease 2014   Headache    Hypertension    Non-compliance    Shortness of breath dyspnea          Family History  Problem Relation Age of Onset   Hypertension Mother    Diabetes Mother     SOCIAL HISTORY: Social History        Socioeconomic History   Marital status: Married    Spouse name: Not on file   Number of children: Not on file   Years of education: Not on file   Highest education level: Not on file  Occupational History   Not on file  Social Needs   Financial resource strain: Not on file   Food insecurity:    Worry: Not on file    Inability: Not on file   Transportation needs:    Medical: Not on file    Non-medical: Not on file  Tobacco Use   Smoking status: Former Smoker    Packs/day: 0.75    Years: 3.00    Pack years: 2.25    Types: Cigarettes    Last attempt to quit: 05/28/2000    Years since quitting: 18.4   Smokeless tobacco: Never Used  Substance and Sexual Activity   Alcohol use: Yes    Alcohol/week: 0.0 standard drinks    Comment: 1 beer every night   Drug use: No   Sexual activity: Not on file  Lifestyle   Physical activity:    Days  per week: Not on file    Minutes per session: Not on file   Stress: Not on file  Relationships   Social connections:    Talks on phone: Not on file    Gets together: Not on file    Attends religious service: Not on file    Active member of club or organization: Not on file    Attends meetings of clubs or organizations: Not on file    Relationship status: Not on file   Intimate partner violence:    Fear of current or ex partner: Not on file    Emotionally abused: Not on file    Physically abused: Not on file    Forced sexual activity: Not on file  Other Topics Concern   Not on file  Social History Narrative   Not on file    No Known Allergies        Current Outpatient Medications  Medication Sig Dispense Refill   amLODipine (NORVASC) 10 MG tablet Take 1 tablet (10 mg total) by mouth daily. 30 tablet 6   atorvastatin (LIPITOR) 20 MG tablet Take 1 tablet (20 mg total) by mouth daily. 30 tablet 6   carvedilol (  COREG) 3.125 MG tablet Take 1 tablet (3.125 mg total) by mouth 2 (two) times daily. 180 tablet 3   glipiZIDE (GLUCOTROL) 5 MG tablet Take 0.5 tablets (2.5 mg total) by mouth daily before breakfast. 15 tablet 6   levothyroxine (SYNTHROID, LEVOTHROID) 112 MCG tablet Take 1 tablet (112 mcg total) by mouth daily. 60 tablet 0   torsemide (DEMADEX) 100 MG tablet Take 100 mg by mouth daily.  6   No current facility-administered medications for this visit.     REVIEW OF SYSTEMS:  [X]  denotes positive finding, [ ]  denotes negative finding Cardiac  Comments:  Chest pain or chest pressure: x   Shortness of breath upon exertion: x   Short of breath when lying flat: x   Irregular heart rhythm: x       Vascular    Pain in calf, thigh, or hip brought on by ambulation: x   Pain in feet at night that wakes you up from your sleep:  x   Blood clot in your veins:    Leg swelling:  x       Pulmonary    Oxygen at home:      Productive cough:     Wheezing:         Neurologic    Sudden weakness in arms or legs:     Sudden numbness in arms or legs:     Sudden onset of difficulty speaking or slurred speech:    Temporary loss of vision in one eye:     Problems with dizziness:  x       Gastrointestinal    Blood in stool:     Vomited blood:         Genitourinary    Burning when urinating:     Blood in urine: x       Psychiatric    Major depression:         Hematologic    Bleeding problems:    Problems with blood clotting too easily:        Skin    Rashes or ulcers: x       Constitutional    Fever or chills:     PHYSICAL EXAM:                                      Vitals:   04/22/19 0610 04/22/19 0645  BP:  (!) 169/105  Pulse: 83 75  Resp: 18   Temp: 98.7 F (37.1 C)   SpO2: 100%     GENERAL: The patient is a well-nourished male, in no acute distress. The vital signs are documented above. CARDIAC: There is a regular rate and rhythm.  VASCULAR: I do not detect carotid bruits. He has palpable radial pulses bilaterally. He has mild bilateral lower extremity swelling. PULMONARY: There is good air exchange bilaterally without wheezing or rales. ABDOMEN: Soft and non-tender with normal pitched bowel sounds.  MUSCULOSKELETAL: There are no major deformities or cyanosis. NEUROLOGIC: No focal weakness or paresthesias are detected. SKIN: There are no ulcers or rashes noted. PSYCHIATRIC: The patient has a normal affect.  DATA:    UPPER EXTREMITY VEIN MAP: I have independently interpreted his upper extremity vein map.  On the left side the forearm cephalic vein looks small.  The upper arm cephalic vein looks small.  The basilic vein is small.  There are multiple branches noted in both  of these veins also.  On the right side the forearm cephalic vein is marginal in size.  The upper arm cephalic vein there is down  to 0.29 cm.  The basilic vein is not visualized in the upper arm.  UPPER EXTREMITY ARTERIAL DUPLEX: I have independently interpreted his upper extremity arterial duplex.  On the left side, there is a triphasic radial and ulnar waveform.  The brachial artery measures 0.5 cm in diameter.  On the right side there is a triphasic radial and ulnar waveform.  The brachial artery measures 0.49 cm in diameter.  LABS: I reviewed his labs that were done on 06/12/2018.  At that time his GFR was 18.  GFR on 08/20/2018 was 19.  ASSESSMENT & PLAN:   STAGE IV CHRONIC KIDNEY DISEASE: Based on his vein map, his best option for a fistula appears to be a right brachiocephalic fistula.  The veins in the left arm are very small with multiple branches.  In addition he tells me that they have a hard time drawing blood from the left arm and usually have to go to the right side.  Thus if we are going to do a fistula I think it would be best to try the on the right side.  He understands if the vein is not adequate we would have to place an AV graft.  Given that he has stage IV chronic kidney disease I will try to place a fistula if at all possible even if the vein is marginal in size.  I have explained the indications for placement of an AV fistula or AV graft. I've explained that if at all possible we will place an AV fistula.  I have reviewed the risks of placement of an AV fistula including but not limited to: failure of the fistula to mature, need for subsequent interventions, and thrombosis. In addition I have reviewed the potential complications of placement of an AV graft. These risks include, but are not limited to, graft thrombosis, graft infection, wound healing problems, bleeding, arm swelling, and steal syndrome. All the patient's questions were answered and they are agreeable to proceed with surgery.   Deitra Mayo

## 2019-04-22 NOTE — Anesthesia Postprocedure Evaluation (Signed)
Anesthesia Post Note  Patient: Edward CELONA Sr.  Procedure(s) Performed: RIGHT ARM ARTERIOVENOUS (AV) FISTULA CREATION (Right )     Patient location during evaluation: PACU Anesthesia Type: MAC Level of consciousness: awake and alert Pain management: pain level controlled Vital Signs Assessment: post-procedure vital signs reviewed and stable Respiratory status: spontaneous breathing, nonlabored ventilation, respiratory function stable and patient connected to nasal cannula oxygen Cardiovascular status: stable and blood pressure returned to baseline Postop Assessment: no apparent nausea or vomiting Anesthetic complications: no    Last Vitals:  Vitals:   04/22/19 0935 04/22/19 0950  BP: (!) 129/92 (!) 126/95  Pulse: 80 79  Resp: 15 17  Temp:  (!) 36.3 C  SpO2: 99% 99%    Last Pain:  Vitals:   04/22/19 0950  TempSrc:   PainSc: 2                  Kip Kautzman P Tammey Deeg

## 2019-04-22 NOTE — Transfer of Care (Signed)
Immediate Anesthesia Transfer of Care Note  Patient: Edward HAMMAN Sr.  Procedure(s) Performed: RIGHT ARM ARTERIOVENOUS (AV) FISTULA CREATION (Right )  Patient Location: PACU  Anesthesia Type:MAC  Level of Consciousness: drowsy and patient cooperative  Airway & Oxygen Therapy: Patient Spontanous Breathing and Patient connected to face mask oxygen  Post-op Assessment: Report given to RN and Post -op Vital signs reviewed and stable  Post vital signs: Reviewed and stable  Last Vitals:  Vitals Value Taken Time  BP 123/88 04/22/19 0922  Temp    Pulse 83 04/22/19 0921  Resp 18 04/22/19 0921  SpO2 100 % 04/22/19 0921  Vitals shown include unvalidated device data.  Last Pain:  Vitals:   04/22/19 0639  TempSrc:   PainSc: 0-No pain      Patients Stated Pain Goal: 3 (71/69/67 8938)  Complications: No apparent anesthesia complications

## 2019-04-23 ENCOUNTER — Encounter (HOSPITAL_COMMUNITY): Payer: Self-pay | Admitting: Vascular Surgery

## 2019-04-25 MED FILL — TORSEMIDE 100 MG TABLET: 100 | 30 days supply | Qty: 30 | Fill #1

## 2019-04-29 ENCOUNTER — Telehealth: Payer: Self-pay | Admitting: Cardiology

## 2019-04-29 ENCOUNTER — Telehealth: Payer: Self-pay | Admitting: *Deleted

## 2019-04-29 NOTE — Telephone Encounter (Signed)
LVM, confirming pt's appt for 04-30-19 with Dr Harrell Gave,

## 2019-04-29 NOTE — Telephone Encounter (Signed)
Patient called stating moderate amount of swelling and pain right arm post op A/V fistula 04/22/2019. Patient instructed to keep arm elevated above level of heart and work hand and fingers. He denies fever, chill, heat or redness at site. Denies drainage. Appt given to see Scot Dock on 05/01/2019. Instructed to go to ER for acute or worsening condition.

## 2019-04-30 ENCOUNTER — Telehealth: Payer: Self-pay

## 2019-04-30 ENCOUNTER — Telehealth: Payer: Medicare HMO | Admitting: Cardiology

## 2019-04-30 ENCOUNTER — Telehealth (HOSPITAL_COMMUNITY): Payer: Self-pay | Admitting: Rehabilitation

## 2019-04-30 ENCOUNTER — Other Ambulatory Visit: Payer: Self-pay

## 2019-04-30 NOTE — Telephone Encounter (Signed)
Called Pt x3 for virtual visit. Pt did not answer left two vm.

## 2019-04-30 NOTE — Telephone Encounter (Signed)

## 2019-05-01 ENCOUNTER — Ambulatory Visit (HOSPITAL_COMMUNITY)
Admission: RE | Admit: 2019-05-01 | Discharge: 2019-05-01 | Disposition: A | Discharge status: Home / Self Care | Payer: Medicare HMO | Source: Ambulatory Visit | Attending: Nephrology | Admitting: Nephrology

## 2019-05-01 ENCOUNTER — Encounter: Payer: Self-pay | Admitting: Vascular Surgery

## 2019-05-01 ENCOUNTER — Ambulatory Visit (INDEPENDENT_AMBULATORY_CARE_PROVIDER_SITE_OTHER): Payer: Medicare HMO | Admitting: Vascular Surgery

## 2019-05-01 VITALS — BP 140/83 | HR 81 | Temp 97.6°F | Resp 20 | Ht 72.0 in | Wt 256.0 lb

## 2019-05-01 VITALS — BP 147/88 | HR 84 | Temp 97.4°F | Resp 18

## 2019-05-01 DIAGNOSIS — N189 Chronic kidney disease, unspecified: Secondary | ICD-10-CM | POA: Diagnosis not present

## 2019-05-01 DIAGNOSIS — N179 Acute kidney failure, unspecified: Secondary | ICD-10-CM | POA: Diagnosis not present

## 2019-05-01 DIAGNOSIS — N184 Chronic kidney disease, stage 4 (severe): Secondary | ICD-10-CM | POA: Diagnosis not present

## 2019-05-01 LAB — IRON AND TIBC
Iron: 57 ug/dL (ref 45–182)
Saturation Ratios: 19 % (ref 17.9–39.5)
TIBC: 301 ug/dL (ref 250–450)
UIBC: 244 ug/dL

## 2019-05-01 LAB — POCT HEMOGLOBIN-HEMACUE: Hemoglobin: 8.8 g/dL — ABNORMAL LOW (ref 13.0–17.0)

## 2019-05-01 LAB — FERRITIN: Ferritin: 304 ng/mL (ref 24–336)

## 2019-05-01 MED ORDER — EPOETIN ALFA-EPBX 10000 UNIT/ML IJ SOLN: 15000.0000 [IU] | Freq: Once | INTRAMUSCULAR | Status: DC

## 2019-05-01 MED ORDER — EPOETIN ALFA-EPBX 10000 UNIT/ML IJ SOLN: 15000.0000 [IU] | INTRAMUSCULAR | Status: DC

## 2019-05-01 MED ORDER — EPOETIN ALFA-EPBX 10000 UNIT/ML IJ SOLN
10000.0000 [IU] | Freq: Once | INTRAMUSCULAR | Status: AC
  Administered 2019-05-01: 10000 [IU] via SUBCUTANEOUS
  Filled 2019-05-01: qty 1

## 2019-05-01 MED ORDER — EPOETIN ALFA-EPBX 3000 UNIT/ML IJ SOLN
3000.0000 [IU] | Freq: Once | INTRAMUSCULAR | Status: AC
  Administered 2019-05-01: 3000 [IU] via SUBCUTANEOUS
  Filled 2019-05-01: qty 1

## 2019-05-01 MED ORDER — EPOETIN ALFA-EPBX 2000 UNIT/ML IJ SOLN
2000.0000 [IU] | Freq: Once | INTRAMUSCULAR | Status: AC
  Administered 2019-05-01: 2000 [IU] via SUBCUTANEOUS
  Filled 2019-05-01: qty 1

## 2019-05-01 NOTE — Progress Notes (Signed)
Patient name: Edward BERNARD Sr. MRN: [id number] DOB: 1970-12-16 Sex: male  REASON FOR VISIT:   Follow-up after right brachiocephalic fistula.   HPI:   Edward MANDLER Sr. is a pleasant 48 y.o. male who underwent a right brachiocephalic fistula on 9/38/1017.  He comes in today complaining of swelling in the right arm.  This was gradual in onset.  The arm is not especially tender and he does not have any symptoms consistent with steal.  Current Outpatient Medications  Medication Sig Dispense Refill  . acetaminophen (TYLENOL) 500 MG tablet Take 500 mg by mouth every 6 (six) hours as needed.    Marland Kitchen atorvastatin (LIPITOR) 20 MG tablet Take 1 tablet (20 mg total) by mouth daily. 30 tablet 6  . calcitRIOL (ROCALTROL) 0.5 MCG capsule Take 0.5 mcg by mouth daily.    . carvedilol (COREG) 3.125 MG tablet Take 1 tablet (3.125 mg total) by mouth 2 (two) times daily. 180 tablet 3  . diclofenac sodium (VOLTAREN) 1 % GEL Apply 2 g topically 3 (three) times daily as needed (arthritic pain).    Marland Kitchen glipiZIDE (GLUCOTROL) 5 MG tablet Take 0.5 tablets (2.5 mg total) by mouth daily before breakfast. (Patient taking differently: Take 2.5 mg by mouth daily. Hold for low blood glucose) 15 tablet 6  . hydrALAZINE (APRESOLINE) 50 MG tablet Take 1 tablet (50 mg total) by mouth 2 (two) times daily. 60 tablet 3  . levothyroxine (SYNTHROID, LEVOTHROID) 112 MCG tablet Take 1 tablet (112 mcg total) by mouth daily. (Patient taking differently: Take 112 mcg by mouth daily before breakfast. ) 60 tablet 0  . torsemide (DEMADEX) 100 MG tablet Take 100 mg by mouth daily.  6  . oxyCODONE (ROXICODONE) 5 MG immediate release tablet Take 1 tablet (5 mg total) by mouth every 4 (four) hours as needed. (Patient not taking: Reported on 05/01/2019) 15 tablet 0   No current facility-administered medications for this visit.     REVIEW OF SYSTEMS:  [X]  denotes positive finding, [ ]  denotes negative finding Vascular    Leg  swelling    Cardiac    Chest pain or chest pressure:    Shortness of breath upon exertion:    Short of breath when lying flat:    Irregular heart rhythm:    Constitutional    Fever or chills:     PHYSICAL EXAM:   Vitals:   05/01/19 0823  BP: 140/83  Pulse: 81  Resp: 20  Temp: 97.6 F (36.4 C)  TempSrc: Temporal  SpO2: 100%  Weight: 256 lb (116.1 kg)  Height: 6' (1.829 m)    GENERAL: The patient is a well-nourished male, in no acute distress. The vital signs are documented above. CARDIOVASCULAR: There is a regular rate and rhythm. PULMONARY: There is good air exchange bilaterally without wheezing or rales. On exam he has an excellent thrill in his right upper arm fistula. He has moderate swelling in the forearm. There is no evidence of hematoma. He has a palpable radial pulse.  DATA:   No new data  MEDICAL ISSUES:   STATUS POST RIGHT BRACHIOCEPHALIC FISTULA: I have encouraged him to elevate his arm and have also put a mild compression on his forearm.  I explained that if the swelling persist we would need to consider a fistulogram in about 6 to 8 weeks.  He will keep his appointment in 6 weeks when he has a duplex.  If the swelling worsens he will call sooner.  I  explained that most importantly however he needs to elevate his arm above his heart.  Deitra Mayo [facility] [phone]

## 2019-05-08 MED FILL — TORSEMIDE 100 MG TABLET: 100 | 30 days supply | Qty: 30 | Fill #1

## 2019-05-08 MED FILL — CARVEDILOL 3.125 MG TABLET: 3.125 | 30 days supply | Qty: 60 | Fill #1

## 2019-05-08 MED FILL — DICLOFENAC SODIUM 1% GEL: 1 | 12 days supply | Qty: 100 | Fill #1

## 2019-05-08 MED FILL — ATORVASTATIN 20 MG TABLET: 20 | 30 days supply | Qty: 30 | Fill #1

## 2019-05-08 MED FILL — CALCITRIOL 0.5 MCG CAPS: 0.5 | 30 days supply | Qty: 30 | Fill #1

## 2019-05-08 MED FILL — hydrALAZINE HCL 50 MG TABS: 50 | 30 days supply | Qty: 60 | Fill #1

## 2019-05-08 MED FILL — LEVOTHYROXINE 112 MCG TAB: 112 | 30 days supply | Qty: 30 | Fill #1

## 2019-05-10 DIAGNOSIS — I120 Hypertensive chronic kidney disease with stage 5 chronic kidney disease or end stage renal disease: Secondary | ICD-10-CM | POA: Diagnosis not present

## 2019-05-10 DIAGNOSIS — Z9114 Patient's other noncompliance with medication regimen: Secondary | ICD-10-CM | POA: Diagnosis not present

## 2019-05-10 DIAGNOSIS — I770 Arteriovenous fistula, acquired: Secondary | ICD-10-CM | POA: Diagnosis not present

## 2019-05-10 DIAGNOSIS — D631 Anemia in chronic kidney disease: Secondary | ICD-10-CM | POA: Diagnosis not present

## 2019-05-10 DIAGNOSIS — N185 Chronic kidney disease, stage 5: Secondary | ICD-10-CM | POA: Diagnosis not present

## 2019-05-10 DIAGNOSIS — N2581 Secondary hyperparathyroidism of renal origin: Secondary | ICD-10-CM | POA: Diagnosis not present

## 2019-05-14 ENCOUNTER — Other Ambulatory Visit (HOSPITAL_COMMUNITY): Payer: Self-pay | Admitting: *Deleted

## 2019-05-15 ENCOUNTER — Ambulatory Visit (HOSPITAL_COMMUNITY)
Admission: RE | Admit: 2019-05-15 | Discharge: 2019-05-15 | Disposition: A | Discharge status: Home / Self Care | Payer: Medicare HMO | Source: Ambulatory Visit | Attending: Nephrology | Admitting: Nephrology

## 2019-05-15 ENCOUNTER — Other Ambulatory Visit: Payer: Self-pay

## 2019-05-15 VITALS — BP 135/79 | HR 75 | Temp 96.8°F | Resp 18

## 2019-05-15 DIAGNOSIS — N189 Chronic kidney disease, unspecified: Secondary | ICD-10-CM | POA: Insufficient documentation

## 2019-05-15 DIAGNOSIS — N184 Chronic kidney disease, stage 4 (severe): Secondary | ICD-10-CM | POA: Diagnosis not present

## 2019-05-15 DIAGNOSIS — N179 Acute kidney failure, unspecified: Secondary | ICD-10-CM | POA: Diagnosis not present

## 2019-05-15 LAB — POCT HEMOGLOBIN-HEMACUE: Hemoglobin: 8.8 g/dL — ABNORMAL LOW (ref 13.0–17.0)

## 2019-05-15 MED ORDER — SODIUM CHLORIDE 0.9 % IV SOLN
510.0000 mg | INTRAVENOUS | Status: DC
  Administered 2019-05-15: 510 mg via INTRAVENOUS
  Filled 2019-05-15: qty 17

## 2019-05-15 MED ORDER — EPOETIN ALFA-EPBX 10000 UNIT/ML IJ SOLN
15000.0000 [IU] | INTRAMUSCULAR | Status: DC
  Administered 2019-05-15: 15000 [IU] via SUBCUTANEOUS
  Filled 2019-05-15: qty 2

## 2019-05-22 ENCOUNTER — Other Ambulatory Visit: Payer: Self-pay

## 2019-05-22 ENCOUNTER — Ambulatory Visit (HOSPITAL_COMMUNITY)
Admission: RE | Admit: 2019-05-22 | Discharge: 2019-05-22 | Disposition: A | Discharge status: Home / Self Care | Payer: Medicare HMO | Source: Ambulatory Visit | Attending: Nephrology | Admitting: Nephrology

## 2019-05-22 DIAGNOSIS — D631 Anemia in chronic kidney disease: Secondary | ICD-10-CM | POA: Diagnosis not present

## 2019-05-22 DIAGNOSIS — N189 Chronic kidney disease, unspecified: Secondary | ICD-10-CM | POA: Insufficient documentation

## 2019-05-22 MED ORDER — SODIUM CHLORIDE 0.9 % IV SOLN
510.0000 mg | INTRAVENOUS | Status: DC
  Administered 2019-05-22: 10:00:00 510 mg via INTRAVENOUS
  Filled 2019-05-22: qty 17

## 2019-05-29 ENCOUNTER — Other Ambulatory Visit: Payer: Self-pay

## 2019-05-29 ENCOUNTER — Ambulatory Visit (HOSPITAL_COMMUNITY)
Admission: RE | Admit: 2019-05-29 | Discharge: 2019-05-29 | Disposition: A | Discharge status: Home / Self Care | Payer: Medicare HMO | Source: Ambulatory Visit | Attending: Nephrology | Admitting: Nephrology

## 2019-05-29 VITALS — BP 135/80 | HR 77 | Temp 96.4°F

## 2019-05-29 DIAGNOSIS — N179 Acute kidney failure, unspecified: Secondary | ICD-10-CM

## 2019-05-29 DIAGNOSIS — N189 Chronic kidney disease, unspecified: Secondary | ICD-10-CM | POA: Insufficient documentation

## 2019-05-29 DIAGNOSIS — N184 Chronic kidney disease, stage 4 (severe): Secondary | ICD-10-CM

## 2019-05-29 LAB — IRON AND TIBC
Iron: 82 ug/dL (ref 45–182)
Saturation Ratios: 30 % (ref 17.9–39.5)
TIBC: 273 ug/dL (ref 250–450)
UIBC: 191 ug/dL

## 2019-05-29 LAB — FERRITIN: Ferritin: 588 ng/mL — ABNORMAL HIGH (ref 24–336)

## 2019-05-29 LAB — POCT HEMOGLOBIN-HEMACUE: Hemoglobin: 9.6 g/dL — ABNORMAL LOW (ref 13.0–17.0)

## 2019-05-29 MED ORDER — EPOETIN ALFA-EPBX 10000 UNIT/ML IJ SOLN
15000.0000 [IU] | INTRAMUSCULAR | Status: DC
  Administered 2019-05-29: 10:00:00 15000 [IU] via SUBCUTANEOUS
  Filled 2019-05-29: qty 2

## 2019-06-11 MED FILL — CALCITRIOL 0.5 MCG CAPS: 0.5 | 30 days supply | Qty: 60 | Fill #0

## 2019-06-12 ENCOUNTER — Encounter (HOSPITAL_COMMUNITY)
Admission: RE | Admit: 2019-06-12 | Discharge: 2019-06-12 | Disposition: A | Discharge status: Home / Self Care | Payer: Medicare HMO | Source: Ambulatory Visit | Attending: Nephrology | Admitting: Nephrology

## 2019-06-12 ENCOUNTER — Other Ambulatory Visit: Payer: Self-pay

## 2019-06-12 VITALS — BP 132/81 | HR 84 | Temp 97.1°F | Resp 18

## 2019-06-12 DIAGNOSIS — N184 Chronic kidney disease, stage 4 (severe): Secondary | ICD-10-CM | POA: Diagnosis not present

## 2019-06-12 DIAGNOSIS — N189 Chronic kidney disease, unspecified: Secondary | ICD-10-CM | POA: Insufficient documentation

## 2019-06-12 DIAGNOSIS — N179 Acute kidney failure, unspecified: Secondary | ICD-10-CM | POA: Insufficient documentation

## 2019-06-12 LAB — POCT HEMOGLOBIN-HEMACUE: Hemoglobin: 9.9 g/dL — ABNORMAL LOW (ref 13.0–17.0)

## 2019-06-12 MED ORDER — EPOETIN ALFA-EPBX 10000 UNIT/ML IJ SOLN
15000.0000 [IU] | INTRAMUSCULAR | Status: DC
  Administered 2019-06-12: 15000 [IU] via SUBCUTANEOUS
  Filled 2019-06-12: qty 2

## 2019-06-14 ENCOUNTER — Other Ambulatory Visit: Payer: Self-pay

## 2019-06-14 DIAGNOSIS — N184 Chronic kidney disease, stage 4 (severe): Secondary | ICD-10-CM

## 2019-06-17 NOTE — Progress Notes (Signed)
POST OPERATIVE OFFICE NOTE    CC:  F/u for surgery  HPI:  This is a 48 y.o. male who is s/p who is s/p right BC AVF on 04/22/2019 by Dr. Scot Dock.  He came in on 05/01/2019 with c/o swelling.  He was advised on arm elevation and gentle compression.  He did not have any evidence of steal.  He was advised to keep his 6 week appt.  If he had continued swelling he would need a fistulogram.  He presents today for follow up.   He states that his swelling has resolved.  He denies any pain in his hand.  He is not yet on dialysis.  He does not have any complaints.   No Known Allergies  Current Outpatient Medications  Medication Sig Dispense Refill  . acetaminophen (TYLENOL) 500 MG tablet Take 500 mg by mouth every 6 (six) hours as needed.    Marland Kitchen atorvastatin (LIPITOR) 20 MG tablet Take 1 tablet (20 mg total) by mouth daily. 30 tablet 6  . calcitRIOL (ROCALTROL) 0.5 MCG capsule Take 0.5 mcg by mouth daily.    . carvedilol (COREG) 3.125 MG tablet Take 1 tablet (3.125 mg total) by mouth 2 (two) times daily. 180 tablet 3  . diclofenac sodium (VOLTAREN) 1 % GEL Apply 2 g topically 3 (three) times daily as needed (arthritic pain).    Marland Kitchen glipiZIDE (GLUCOTROL) 5 MG tablet Take 0.5 tablets (2.5 mg total) by mouth daily before breakfast. (Patient taking differently: Take 2.5 mg by mouth daily. Hold for low blood glucose) 15 tablet 6  . hydrALAZINE (APRESOLINE) 50 MG tablet Take 1 tablet (50 mg total) by mouth 2 (two) times daily. 60 tablet 3  . levothyroxine (SYNTHROID, LEVOTHROID) 112 MCG tablet Take 1 tablet (112 mcg total) by mouth daily. (Patient taking differently: Take 112 mcg by mouth daily before breakfast. ) 60 tablet 0  . oxyCODONE (ROXICODONE) 5 MG immediate release tablet Take 1 tablet (5 mg total) by mouth every 4 (four) hours as needed. (Patient not taking: Reported on 05/01/2019) 15 tablet 0  . torsemide (DEMADEX) 100 MG tablet Take 100 mg by mouth daily.  6   No current facility-administered  medications for this visit.      ROS:  See HPI  Physical Exam:   Incision:  Healed nicely Extremities:  +palpable right radial pulse; there is an excellent thrill in the fistula and is easily palpable throughout.  Motor and sensation are in tact right hand  Dialysis duplex 06/18/2019: OUTFLOW VEINPSV (cm/s)Diameter (cm)Depth (cm)    Describe     +------------+----------+-------------+----------+----------------+ Shoulder       177        0.64        0.84                    +------------+----------+-------------+----------+----------------+ Prox UA        296        0.64        0.50                    +------------+----------+-------------+----------+----------------+ Mid UA         455        0.59        0.33   competing branch +------------+----------+-------------+----------+----------------+ Dist UA        374        0.62        0.58                    +------------+----------+-------------+----------+----------------+  AC Fossa       569        0.52        0.55                    +------------+----------+-------------+----------+----------------+   Assessment/Plan:  This is a 48 y.o. male who is s/p: right BC AVF on 04/22/2019 by Dr. Scot Dock.  -pt doing well and does not have any evidence of steal.  He has an easily palpable right radial pulse and excellent thrill throughout. -duplex today reveals that fistula is maturing nicely. He is not yet on HD, but may start using fistula 07/23/19 if needed.  -He iwill f/u as needed.    Leontine Locket, PA-C [facility] [phone]  Clinic MD:  Scot Dock (on call MD)

## 2019-06-18 ENCOUNTER — Ambulatory Visit (HOSPITAL_COMMUNITY)
Admission: RE | Admit: 2019-06-18 | Discharge: 2019-06-18 | Disposition: A | Discharge status: Home / Self Care | Payer: Medicare HMO | Source: Ambulatory Visit | Attending: Vascular Surgery | Admitting: Vascular Surgery

## 2019-06-18 ENCOUNTER — Ambulatory Visit (INDEPENDENT_AMBULATORY_CARE_PROVIDER_SITE_OTHER): Payer: Self-pay | Admitting: Physician Assistant

## 2019-06-18 ENCOUNTER — Other Ambulatory Visit: Payer: Self-pay

## 2019-06-18 DIAGNOSIS — N184 Chronic kidney disease, stage 4 (severe): Secondary | ICD-10-CM | POA: Diagnosis not present

## 2019-06-19 ENCOUNTER — Ambulatory Visit (HOSPITAL_COMMUNITY): Payer: Medicare HMO

## 2019-06-19 ENCOUNTER — Encounter: Payer: Medicare HMO | Admitting: Vascular Surgery

## 2019-06-21 DIAGNOSIS — Z9114 Patient's other noncompliance with medication regimen: Secondary | ICD-10-CM | POA: Diagnosis not present

## 2019-06-21 DIAGNOSIS — I770 Arteriovenous fistula, acquired: Secondary | ICD-10-CM | POA: Diagnosis not present

## 2019-06-21 DIAGNOSIS — N185 Chronic kidney disease, stage 5: Secondary | ICD-10-CM | POA: Diagnosis not present

## 2019-06-21 DIAGNOSIS — D631 Anemia in chronic kidney disease: Secondary | ICD-10-CM | POA: Diagnosis not present

## 2019-06-21 DIAGNOSIS — N2581 Secondary hyperparathyroidism of renal origin: Secondary | ICD-10-CM | POA: Diagnosis not present

## 2019-06-21 DIAGNOSIS — I120 Hypertensive chronic kidney disease with stage 5 chronic kidney disease or end stage renal disease: Secondary | ICD-10-CM | POA: Diagnosis not present

## 2019-06-21 MED FILL — CALCITRIOL 0.5 MCG CAPS: 0.5 | 30 days supply | Qty: 60 | Fill #0

## 2019-06-26 ENCOUNTER — Encounter (HOSPITAL_COMMUNITY)
Admission: RE | Admit: 2019-06-26 | Discharge: 2019-06-26 | Disposition: A | Discharge status: Home / Self Care | Payer: Medicare HMO | Source: Ambulatory Visit | Attending: Nephrology | Admitting: Nephrology

## 2019-06-26 ENCOUNTER — Other Ambulatory Visit: Payer: Self-pay

## 2019-06-26 VITALS — BP 153/87 | HR 88 | Temp 97.1°F | Resp 18

## 2019-06-26 DIAGNOSIS — N184 Chronic kidney disease, stage 4 (severe): Secondary | ICD-10-CM | POA: Diagnosis not present

## 2019-06-26 DIAGNOSIS — N179 Acute kidney failure, unspecified: Secondary | ICD-10-CM | POA: Diagnosis not present

## 2019-06-26 DIAGNOSIS — N189 Chronic kidney disease, unspecified: Secondary | ICD-10-CM | POA: Diagnosis not present

## 2019-06-26 LAB — IRON AND TIBC
Iron: 76 ug/dL (ref 45–182)
Saturation Ratios: 27 % (ref 17.9–39.5)
TIBC: 281 ug/dL (ref 250–450)
UIBC: 205 ug/dL

## 2019-06-26 LAB — POCT HEMOGLOBIN-HEMACUE: Hemoglobin: 10.1 g/dL — ABNORMAL LOW (ref 13.0–17.0)

## 2019-06-26 LAB — FERRITIN: Ferritin: 358 ng/mL — ABNORMAL HIGH (ref 24–336)

## 2019-06-26 MED ORDER — EPOETIN ALFA-EPBX 10000 UNIT/ML IJ SOLN
15000.0000 [IU] | INTRAMUSCULAR | Status: DC
  Administered 2019-06-26: 15000 [IU] via SUBCUTANEOUS
  Filled 2019-06-26: qty 2

## 2019-06-27 MED FILL — SODIUM BICARBONATE 650 MG T: 650 | 30 days supply | Qty: 120 | Fill #0

## 2019-07-10 ENCOUNTER — Other Ambulatory Visit: Payer: Self-pay

## 2019-07-10 ENCOUNTER — Ambulatory Visit (HOSPITAL_COMMUNITY)
Admission: RE | Admit: 2019-07-10 | Discharge: 2019-07-10 | Disposition: A | Discharge status: Home / Self Care | Payer: Medicare HMO | Source: Ambulatory Visit | Attending: Nephrology | Admitting: Nephrology

## 2019-07-10 VITALS — BP 134/81 | HR 82 | Temp 97.4°F | Resp 18

## 2019-07-10 DIAGNOSIS — N189 Chronic kidney disease, unspecified: Secondary | ICD-10-CM | POA: Diagnosis not present

## 2019-07-10 DIAGNOSIS — N179 Acute kidney failure, unspecified: Secondary | ICD-10-CM | POA: Diagnosis not present

## 2019-07-10 DIAGNOSIS — N184 Chronic kidney disease, stage 4 (severe): Secondary | ICD-10-CM | POA: Diagnosis not present

## 2019-07-10 LAB — POCT HEMOGLOBIN-HEMACUE: Hemoglobin: 10.1 g/dL — ABNORMAL LOW (ref 13.0–17.0)

## 2019-07-10 MED ORDER — EPOETIN ALFA-EPBX 3000 UNIT/ML IJ SOLN
3000.0000 [IU] | Freq: Once | INTRAMUSCULAR | Status: AC
  Administered 2019-07-10: 3000 [IU] via SUBCUTANEOUS
  Filled 2019-07-10: qty 1

## 2019-07-10 MED ORDER — EPOETIN ALFA-EPBX 2000 UNIT/ML IJ SOLN
2000.0000 [IU] | Freq: Once | INTRAMUSCULAR | Status: AC
  Administered 2019-07-10: 11:00:00 2000 [IU] via SUBCUTANEOUS
  Filled 2019-07-10: qty 1

## 2019-07-10 MED ORDER — EPOETIN ALFA-EPBX 10000 UNIT/ML IJ SOLN
15000.0000 [IU] | INTRAMUSCULAR | Status: DC
  Administered 2019-07-10: 10000 [IU] via SUBCUTANEOUS
  Filled 2019-07-10: qty 2

## 2019-07-16 MED FILL — SODIUM BICARBONATE 650 MG T: 650 | 30 days supply | Qty: 120 | Fill #0

## 2019-07-24 ENCOUNTER — Encounter (HOSPITAL_COMMUNITY): Payer: Medicare HMO

## 2019-08-07 ENCOUNTER — Other Ambulatory Visit: Payer: Self-pay

## 2019-08-07 ENCOUNTER — Encounter: Payer: Self-pay | Admitting: Family Medicine

## 2019-08-07 ENCOUNTER — Ambulatory Visit: Payer: Medicare HMO | Attending: Family Medicine | Admitting: Family Medicine

## 2019-08-07 ENCOUNTER — Encounter (HOSPITAL_COMMUNITY): Payer: Medicare HMO

## 2019-08-07 DIAGNOSIS — R140 Abdominal distension (gaseous): Secondary | ICD-10-CM

## 2019-08-07 DIAGNOSIS — E1122 Type 2 diabetes mellitus with diabetic chronic kidney disease: Secondary | ICD-10-CM | POA: Diagnosis not present

## 2019-08-07 DIAGNOSIS — E0500 Thyrotoxicosis with diffuse goiter without thyrotoxic crisis or storm: Secondary | ICD-10-CM

## 2019-08-07 DIAGNOSIS — I10 Essential (primary) hypertension: Secondary | ICD-10-CM

## 2019-08-07 DIAGNOSIS — E7800 Pure hypercholesterolemia, unspecified: Secondary | ICD-10-CM

## 2019-08-07 DIAGNOSIS — N184 Chronic kidney disease, stage 4 (severe): Secondary | ICD-10-CM | POA: Diagnosis not present

## 2019-08-07 MED ORDER — ATORVASTATIN CALCIUM 20 MG PO TABS: 20.0000 mg | ORAL_TABLET | Freq: Every day | ORAL | 6 refills | Status: DC

## 2019-08-07 MED FILL — ATORVASTATIN CALCIUM 20 MG: 20 | 30 days supply | Qty: 30 | Fill #0

## 2019-08-07 NOTE — Progress Notes (Signed)
Patient has been called and DOB has been verified. Patient has been screened and transferred to PCP to start phone visit.   Patient is having pain in back,legs,knee and feet.

## 2019-08-07 NOTE — Progress Notes (Signed)
Virtual Visit via Telephone Note  I connected with Edward [name] Sr., on 08/07/2019 at 10:51 AM by telephone due to the COVID-19 pandemic and verified that I am speaking with the correct person using two identifiers.   Consent: I discussed the limitations, risks, security and privacy concerns of performing an evaluation and management service by telephone and the availability of in person appointments. I also discussed with the patient that there may be a patient responsible charge related to this service. The patient expressed understanding and agreed to proceed.   Location of Patient: Home  Location of Provider: Clinic   Persons participating in Telemedicine visit: KEDRICK MCNAMEE SrElmo Putt [place]-CMA Dr. Felecia Shelling     History of Present Illness: Edward FLEAGLE Sr. is a 48 year old male with a history of hypertension, type 2 diabetes mellitus (A1c 6.4), Graves' disease (status post radioactive iodine treatment in 07/2017), stage IV chronic kidney disease, erectile dysfunction who presents today for a follow-up visit.   He has had AV fistula placed  in anticipation of renal replacement therapy and is receiving iron replacement therapy. He feels bloated a lot with gas in his stomach with a lot of Bomborygmi and when he moves his bowel he has Diarrhea and this has been intermittent. Denies abdominal pain or cramping. Endorses presence of acid reflux and takes tums for this. Most of the time it happens after meals and he states he is lactose intolerant and stays away from dairy. He has not seen his Endocrinologist in a while and has been compliant with Levothyroxine. With regards to his hypertension he does not have a blood pressure monitor to keep a log of his blood pressures but endorses compliance with his medications  Past Medical History:  Diagnosis Date  . Anemia   . Arthritis   . Blind    Left eye  . CHF (congestive heart failure) ([place])   . Chronic  kidney disease    stage 5  . Depression    situatuional  . Diabetes mellitus    Type II  . Graves disease 2014  . Headache    in past  . Hypertension   . Hypothyroidism   . Legally blind    right eye  . Neuropathy   . Non-compliance   . Shortness of breath dyspnea    "Better since I've been taking my medications"   No Known Allergies  Current Outpatient Medications on File Prior to Visit  Medication Sig Dispense Refill  . acetaminophen (TYLENOL) 500 MG tablet Take 500 mg by mouth every 6 (six) hours as needed.    Marland Kitchen atorvastatin (LIPITOR) 20 MG tablet Take 1 tablet (20 mg total) by mouth daily. 30 tablet 6  . calcitRIOL (ROCALTROL) 0.5 MCG capsule Take 0.5 mcg by mouth daily.    . carvedilol (COREG) 3.125 MG tablet Take 1 tablet (3.125 mg total) by mouth 2 (two) times daily. 180 tablet 3  . diclofenac sodium (VOLTAREN) 1 % GEL Apply 2 g topically 3 (three) times daily as needed (arthritic pain).    Marland Kitchen glipiZIDE (GLUCOTROL) 5 MG tablet Take 0.5 tablets (2.5 mg total) by mouth daily before breakfast. (Patient taking differently: Take 2.5 mg by mouth daily. Hold for low blood glucose) 15 tablet 6  . hydrALAZINE (APRESOLINE) 50 MG tablet Take 1 tablet (50 mg total) by mouth 2 (two) times daily. 60 tablet 3  . levothyroxine (SYNTHROID, LEVOTHROID) 112 MCG tablet Take 1 tablet (112 mcg total) by mouth daily. (Patient taking  differently: Take 112 mcg by mouth daily before breakfast. ) 60 tablet 0  . torsemide (DEMADEX) 100 MG tablet Take 100 mg by mouth daily.  6  . oxyCODONE (ROXICODONE) 5 MG immediate release tablet Take 1 tablet (5 mg total) by mouth every 4 (four) hours as needed. (Patient not taking: Reported on 05/01/2019) 15 tablet 0   No current facility-administered medications on file prior to visit.     Observations/Objective: Awake, alert, oriented x3 Not in acute distress  CMP Latest Ref Rng & Units 04/22/2019 08/20/2018 03/21/2018  Glucose 70 - 99 mg/dL 225(H) 140(H) 206(H)   BUN 6 - 24 mg/dL - 36(H) 32(H)  Creatinine 0.76 - 1.27 mg/dL - 4.01(H) 3.67(H)  Sodium 135 - 145 mmol/L 139 143 139  Potassium 3.5 - 5.1 mmol/L 4.5 4.2 3.7  Chloride 96 - 106 mmol/L - 107(H) 108  CO2 20 - 29 mmol/L - 20 22  Calcium 8.7 - 10.2 mg/dL - 8.8 8.6(L)  Total Protein 6.5 - 8.1 g/dL - - 8.1  Total Bilirubin 0.3 - 1.2 mg/dL - - 0.6  Alkaline Phos 38 - 126 U/L - - 95  AST 15 - 41 U/L - - 43(H)  ALT 17 - 63 U/L - - 37    Lipid Panel     Component Value Date/Time   CHOL 242 (H) 06/11/2018 0854   TRIG 107 06/11/2018 0854   HDL 84 06/11/2018 0854   CHOLHDL 2.9 06/11/2018 0854   CHOLHDL 2.8 09/19/2016 1023   VLDL 37 (H) 09/19/2016 1023   LDLCALC 137 (H) 06/11/2018 0854   LABVLDL 21 06/11/2018 0854    Lab Results  Component Value Date   HGBA1C 6.4 10/30/2018     Assessment and Plan: 1. Pure hypercholesterolemia Uncontrolled Low-cholesterol diet Compliant with his medications had been a challenge We will check lipid panel and adjust regimen accordingly - atorvastatin (LIPITOR) 20 MG tablet; Take 1 tablet (20 mg total) by mouth daily.  Dispense: 30 tablet; Refill: 6  2. Type 2 diabetes mellitus with stage 4 chronic kidney disease, without long-term current use of insulin (HCC) Controlled with A1c of 6.4 Diabetic diet, lifestyle modifications Management of CKD as per nephrology, continue iron replacement therapy - Hemoglobin A1c; Future - CMP14+EGFR; Future  3. Graves disease Compliant with levothyroxine Needs to schedule an appointment with his endocrinologist  4. Abdominal bloating He has been advised to keep a food diary so triggers can be eliminated We will proceed with work-up for H. pylori and celiac disease - H. pylori breath test; Future - Tissue Transglutaminase, IGA; Future - Celiac HLA Rflx to Abs; Future  5. Uncontrolled hypertension He has a history of uncontrolled hypertension Compliance with antihypertensive encouraged   Follow Up  Instructions: 3 months   I discussed the assessment and treatment plan with the patient. The patient was provided an opportunity to ask questions and all were answered. The patient agreed with the plan and demonstrated an understanding of the instructions.   The patient was advised to call back or seek an in-person evaluation if the symptoms worsen or if the condition fails to improve as anticipated.     I provided 18 minutes total of non-face-to-face time during this encounter including median intraservice time, reviewing previous notes, labs, imaging, medications, management and patient verbalized understanding.     [name], MD, FAAFP. [facility] and [place], [place]   08/07/2019, 10:51 AM

## 2019-08-12 ENCOUNTER — Other Ambulatory Visit: Payer: Self-pay | Admitting: Internal Medicine

## 2019-08-12 ENCOUNTER — Other Ambulatory Visit: Payer: Self-pay

## 2019-08-12 ENCOUNTER — Other Ambulatory Visit: Payer: Medicare HMO

## 2019-08-12 ENCOUNTER — Ambulatory Visit: Payer: Medicare HMO

## 2019-08-12 MED FILL — LEVOTHYROXINE 112 MCG TAB: 112 | 30 days supply | Qty: 30 | Fill #0

## 2019-08-13 ENCOUNTER — Ambulatory Visit: Payer: Medicare HMO | Attending: Family Medicine

## 2019-08-13 DIAGNOSIS — N184 Chronic kidney disease, stage 4 (severe): Secondary | ICD-10-CM | POA: Diagnosis not present

## 2019-08-13 DIAGNOSIS — R140 Abdominal distension (gaseous): Secondary | ICD-10-CM | POA: Diagnosis not present

## 2019-08-13 DIAGNOSIS — E1122 Type 2 diabetes mellitus with diabetic chronic kidney disease: Secondary | ICD-10-CM | POA: Diagnosis not present

## 2019-08-14 LAB — H. PYLORI BREATH TEST: H pylori Breath Test: NEGATIVE

## 2019-08-15 LAB — CMP14+EGFR
ALT: 33 IU/L (ref 0–44)
AST: 33 IU/L (ref 0–40)
Albumin/Globulin Ratio: 1.3 (ref 1.2–2.2)
Albumin: 4 g/dL (ref 4.0–5.0)
Alkaline Phosphatase: 100 IU/L (ref 39–117)
BUN/Creatinine Ratio: 9 (ref 9–20)
BUN: 60 mg/dL — ABNORMAL HIGH (ref 6–24)
Bilirubin Total: 0.3 mg/dL (ref 0.0–1.2)
CO2: 20 mmol/L (ref 20–29)
Calcium: 9.1 mg/dL (ref 8.7–10.2)
Chloride: 105 mmol/L (ref 96–106)
Creatinine, Ser: 6.77 mg/dL — ABNORMAL HIGH (ref 0.76–1.27)
GFR calc Af Amer: 10 mL/min/{1.73_m2} — ABNORMAL LOW (ref >59)
GFR calc non Af Amer: 9 mL/min/{1.73_m2} — ABNORMAL LOW (ref >59)
Globulin, Total: 3.2 g/dL (ref 1.5–4.5)
Glucose: 168 mg/dL — ABNORMAL HIGH (ref 65–99)
Potassium: 4.5 mmol/L (ref 3.5–5.2)
Sodium: 142 mmol/L (ref 134–144)
Total Protein: 7.2 g/dL (ref 6.0–8.5)

## 2019-08-15 LAB — HEMOGLOBIN A1C
Est. average glucose Bld gHb Est-mCnc: 163 mg/dL
Hgb A1c MFr Bld: 7.3 % — ABNORMAL HIGH (ref 4.8–5.6)

## 2019-08-15 LAB — TISSUE TRANSGLUTAMINASE, IGA: Transglutaminase IgA: <2 U/mL (ref 0–3)

## 2019-08-19 MED FILL — ATORVASTATIN CALCIUM 20 MG: 20 | 90 days supply | Qty: 90 | Fill #0

## 2019-08-23 ENCOUNTER — Telehealth: Payer: Self-pay

## 2019-08-23 NOTE — Telephone Encounter (Signed)
-----   Message from [name], MD sent at 08/17/2019  3:13 PM EST ----- H.pylori test is negative. Test for Celiac disease is negative. Kidney function is abnormal; advise to follow up with his Nephrologist

## 2019-08-23 NOTE — Telephone Encounter (Signed)
Patient was called and informed of lab results via voicemail and to keep appointment with Kidney doctor.

## 2019-08-26 MED FILL — LEVOTHYROXINE 112 MCG TAB: 112 | 30 days supply | Qty: 30 | Fill #0

## 2019-08-26 NOTE — Telephone Encounter (Signed)
Patient called asking for a call back when you get time

## 2019-08-28 MED ORDER — SILDENAFIL CITRATE 50 MG PO TABS: 100.0000 mg | ORAL_TABLET | Freq: Every day | ORAL | 1 refills | Status: DC | PRN

## 2019-08-28 NOTE — Telephone Encounter (Signed)
Patient is needing a refill on Viagra 100mg  sent to [facility] pharmacy.

## 2019-08-28 NOTE — Telephone Encounter (Signed)
Done

## 2019-09-02 DIAGNOSIS — I120 Hypertensive chronic kidney disease with stage 5 chronic kidney disease or end stage renal disease: Secondary | ICD-10-CM | POA: Diagnosis not present

## 2019-09-02 DIAGNOSIS — E872 Acidosis: Secondary | ICD-10-CM | POA: Diagnosis not present

## 2019-09-02 DIAGNOSIS — I770 Arteriovenous fistula, acquired: Secondary | ICD-10-CM | POA: Diagnosis not present

## 2019-09-02 DIAGNOSIS — N2581 Secondary hyperparathyroidism of renal origin: Secondary | ICD-10-CM | POA: Diagnosis not present

## 2019-09-02 DIAGNOSIS — N185 Chronic kidney disease, stage 5: Secondary | ICD-10-CM | POA: Diagnosis not present

## 2019-09-02 DIAGNOSIS — Z9114 Patient's other noncompliance with medication regimen: Secondary | ICD-10-CM | POA: Diagnosis not present

## 2019-09-02 DIAGNOSIS — D631 Anemia in chronic kidney disease: Secondary | ICD-10-CM | POA: Diagnosis not present

## 2019-09-05 ENCOUNTER — Ambulatory Visit (HOSPITAL_COMMUNITY)
Admission: RE | Admit: 2019-09-05 | Discharge: 2019-09-05 | Disposition: A | Discharge status: Home / Self Care | Payer: Medicare HMO | Source: Ambulatory Visit | Attending: Nephrology | Admitting: Nephrology

## 2019-09-05 ENCOUNTER — Other Ambulatory Visit: Payer: Self-pay

## 2019-09-05 VITALS — BP 153/97 | HR 94 | Temp 97.0°F

## 2019-09-05 DIAGNOSIS — N184 Chronic kidney disease, stage 4 (severe): Secondary | ICD-10-CM

## 2019-09-05 DIAGNOSIS — N189 Chronic kidney disease, unspecified: Secondary | ICD-10-CM | POA: Diagnosis not present

## 2019-09-05 DIAGNOSIS — N179 Acute kidney failure, unspecified: Secondary | ICD-10-CM | POA: Diagnosis not present

## 2019-09-05 LAB — IRON AND TIBC
Iron: 85 ug/dL (ref 45–182)
Saturation Ratios: 27 % (ref 17.9–39.5)
TIBC: 315 ug/dL (ref 250–450)
UIBC: 230 ug/dL

## 2019-09-05 LAB — FERRITIN: Ferritin: 459 ng/mL — ABNORMAL HIGH (ref 24–336)

## 2019-09-05 MED ORDER — EPOETIN ALFA-EPBX 10000 UNIT/ML IJ SOLN
10000.0000 [IU] | Freq: Once | INTRAMUSCULAR | Status: AC
  Administered 2019-09-05: 10000 [IU] via SUBCUTANEOUS
  Filled 2019-09-05: qty 1

## 2019-09-05 MED ORDER — EPOETIN ALFA-EPBX 2000 UNIT/ML IJ SOLN
2000.0000 [IU] | Freq: Once | INTRAMUSCULAR | Status: AC
  Administered 2019-09-05: 11:00:00 2000 [IU] via SUBCUTANEOUS
  Filled 2019-09-05: qty 1

## 2019-09-05 MED ORDER — EPOETIN ALFA-EPBX 10000 UNIT/ML IJ SOLN
15000.0000 [IU] | INTRAMUSCULAR | Status: DC
  Filled 2019-09-05: qty 2

## 2019-09-05 MED ORDER — EPOETIN ALFA-EPBX 3000 UNIT/ML IJ SOLN
3000.0000 [IU] | Freq: Once | INTRAMUSCULAR | Status: AC
  Administered 2019-09-05: 3000 [IU] via SUBCUTANEOUS
  Filled 2019-09-05: qty 1

## 2019-09-06 LAB — POCT HEMOGLOBIN-HEMACUE: Hemoglobin: 9.3 g/dL — ABNORMAL LOW (ref 13.0–17.0)

## 2019-09-09 DIAGNOSIS — T82858A Stenosis of vascular prosthetic devices, implants and grafts, initial encounter: Secondary | ICD-10-CM | POA: Diagnosis not present

## 2019-09-09 DIAGNOSIS — N185 Chronic kidney disease, stage 5: Secondary | ICD-10-CM | POA: Diagnosis not present

## 2019-09-09 DIAGNOSIS — I871 Compression of vein: Secondary | ICD-10-CM | POA: Diagnosis not present

## 2019-09-10 MED FILL — CALCITRIOL 0.5 MCG CAPS: 0.5 | 30 days supply | Qty: 90 | Fill #0

## 2019-09-11 DIAGNOSIS — H35371 Puckering of macula, right eye: Secondary | ICD-10-CM | POA: Diagnosis not present

## 2019-09-11 DIAGNOSIS — E113522 Type 2 diabetes mellitus with proliferative diabetic retinopathy with traction retinal detachment involving the macula, left eye: Secondary | ICD-10-CM | POA: Diagnosis not present

## 2019-09-11 DIAGNOSIS — H26222 Cataract secondary to ocular disorders (degenerative) (inflammatory), left eye: Secondary | ICD-10-CM | POA: Diagnosis not present

## 2019-09-11 DIAGNOSIS — H3582 Retinal ischemia: Secondary | ICD-10-CM | POA: Diagnosis not present

## 2019-09-11 DIAGNOSIS — H25811 Combined forms of age-related cataract, right eye: Secondary | ICD-10-CM | POA: Diagnosis not present

## 2019-09-11 DIAGNOSIS — E113511 Type 2 diabetes mellitus with proliferative diabetic retinopathy with macular edema, right eye: Secondary | ICD-10-CM | POA: Diagnosis not present

## 2019-09-14 DIAGNOSIS — E119 Type 2 diabetes mellitus without complications: Secondary | ICD-10-CM | POA: Diagnosis not present

## 2019-09-14 DIAGNOSIS — I872 Venous insufficiency (chronic) (peripheral): Secondary | ICD-10-CM | POA: Diagnosis not present

## 2019-09-14 DIAGNOSIS — R21 Rash and other nonspecific skin eruption: Secondary | ICD-10-CM | POA: Diagnosis not present

## 2019-09-14 DIAGNOSIS — S91309A Unspecified open wound, unspecified foot, initial encounter: Secondary | ICD-10-CM | POA: Diagnosis not present

## 2019-09-19 ENCOUNTER — Ambulatory Visit (HOSPITAL_COMMUNITY)
Admission: RE | Admit: 2019-09-19 | Discharge: 2019-09-19 | Disposition: A | Discharge status: Home / Self Care | Payer: Medicare HMO | Source: Ambulatory Visit | Attending: Nephrology | Admitting: Nephrology

## 2019-09-19 ENCOUNTER — Other Ambulatory Visit: Payer: Self-pay

## 2019-09-19 VITALS — BP 146/86 | HR 85 | Temp 97.3°F | Resp 18 | Ht 72.0 in | Wt 251.0 lb

## 2019-09-19 DIAGNOSIS — N189 Chronic kidney disease, unspecified: Secondary | ICD-10-CM | POA: Insufficient documentation

## 2019-09-19 DIAGNOSIS — N184 Chronic kidney disease, stage 4 (severe): Secondary | ICD-10-CM

## 2019-09-19 DIAGNOSIS — N179 Acute kidney failure, unspecified: Secondary | ICD-10-CM | POA: Diagnosis not present

## 2019-09-19 LAB — POCT HEMOGLOBIN-HEMACUE: Hemoglobin: 9.8 g/dL — ABNORMAL LOW (ref 13.0–17.0)

## 2019-09-19 MED ORDER — EPOETIN ALFA-EPBX 2000 UNIT/ML IJ SOLN
INTRAMUSCULAR | Status: AC
  Administered 2019-09-19: 2000 [IU]
  Filled 2019-09-19: qty 1

## 2019-09-19 MED ORDER — SODIUM CHLORIDE 0.9 % IV SOLN
510.0000 mg | INTRAVENOUS | Status: DC
  Administered 2019-09-19: 11:00:00 510 mg via INTRAVENOUS
  Filled 2019-09-19: qty 17

## 2019-09-19 MED ORDER — EPOETIN ALFA-EPBX 3000 UNIT/ML IJ SOLN
INTRAMUSCULAR | Status: AC
  Administered 2019-09-19: 3000 [IU]
  Filled 2019-09-19: qty 1

## 2019-09-19 MED ORDER — EPOETIN ALFA-EPBX 10000 UNIT/ML IJ SOLN: 15000.0000 [IU] | INTRAMUSCULAR | Status: DC

## 2019-09-19 MED ORDER — EPOETIN ALFA-EPBX 10000 UNIT/ML IJ SOLN
INTRAMUSCULAR | Status: AC
  Administered 2019-09-19: 10000 [IU] via SUBCUTANEOUS
  Filled 2019-09-19: qty 1

## 2019-09-23 ENCOUNTER — Other Ambulatory Visit: Payer: Self-pay | Admitting: Cardiology

## 2019-09-23 DIAGNOSIS — S91309A Unspecified open wound, unspecified foot, initial encounter: Secondary | ICD-10-CM | POA: Diagnosis not present

## 2019-09-23 DIAGNOSIS — R7989 Other specified abnormal findings of blood chemistry: Secondary | ICD-10-CM | POA: Diagnosis not present

## 2019-09-23 DIAGNOSIS — I872 Venous insufficiency (chronic) (peripheral): Secondary | ICD-10-CM | POA: Diagnosis not present

## 2019-09-23 DIAGNOSIS — M79661 Pain in right lower leg: Secondary | ICD-10-CM | POA: Diagnosis not present

## 2019-09-23 DIAGNOSIS — Z1159 Encounter for screening for other viral diseases: Secondary | ICD-10-CM | POA: Diagnosis not present

## 2019-09-23 MED FILL — CARVEDILOL 3.125 MG TABLET: 3.125 | 45 days supply | Qty: 90 | Fill #0

## 2019-09-23 MED FILL — glipiZIDE 5 MG TABS: 5 | 30 days supply | Qty: 15 | Fill #0

## 2019-09-23 MED FILL — CALCITRIOL 0.5 MCG CAPS: 0.5 | 30 days supply | Qty: 90 | Fill #0

## 2019-09-24 MED FILL — TORSEMIDE 100 MG TABLET: 100 | 30 days supply | Qty: 30 | Fill #0

## 2019-09-25 DIAGNOSIS — I872 Venous insufficiency (chronic) (peripheral): Secondary | ICD-10-CM | POA: Diagnosis not present

## 2019-09-25 DIAGNOSIS — B351 Tinea unguium: Secondary | ICD-10-CM | POA: Diagnosis not present

## 2019-09-25 DIAGNOSIS — E1351 Other specified diabetes mellitus with diabetic peripheral angiopathy without gangrene: Secondary | ICD-10-CM | POA: Diagnosis not present

## 2019-09-25 DIAGNOSIS — S90821A Blister (nonthermal), right foot, initial encounter: Secondary | ICD-10-CM | POA: Diagnosis not present

## 2019-10-03 ENCOUNTER — Other Ambulatory Visit: Payer: Self-pay

## 2019-10-03 ENCOUNTER — Ambulatory Visit (HOSPITAL_COMMUNITY)
Admission: RE | Admit: 2019-10-03 | Discharge: 2019-10-03 | Disposition: A | Discharge status: Home / Self Care | Payer: Medicare HMO | Source: Ambulatory Visit | Attending: Nephrology | Admitting: Nephrology

## 2019-10-03 VITALS — BP 151/89 | HR 91 | Temp 96.6°F | Resp 18

## 2019-10-03 DIAGNOSIS — N184 Chronic kidney disease, stage 4 (severe): Secondary | ICD-10-CM | POA: Diagnosis not present

## 2019-10-03 DIAGNOSIS — N179 Acute kidney failure, unspecified: Secondary | ICD-10-CM

## 2019-10-03 DIAGNOSIS — N189 Chronic kidney disease, unspecified: Secondary | ICD-10-CM | POA: Diagnosis not present

## 2019-10-03 LAB — POCT HEMOGLOBIN-HEMACUE: Hemoglobin: 8.7 g/dL — ABNORMAL LOW (ref 13.0–17.0)

## 2019-10-03 MED ORDER — SODIUM CHLORIDE 0.9 % IV SOLN
510.0000 mg | INTRAVENOUS | Status: DC
  Filled 2019-10-03: qty 17

## 2019-10-03 MED ORDER — EPOETIN ALFA-EPBX 3000 UNIT/ML IJ SOLN
INTRAMUSCULAR | Status: AC
  Administered 2019-10-03: 3000 [IU] via SUBCUTANEOUS
  Filled 2019-10-03: qty 1

## 2019-10-03 MED ORDER — EPOETIN ALFA-EPBX 2000 UNIT/ML IJ SOLN
INTRAMUSCULAR | Status: AC
  Administered 2019-10-03: 2000 [IU] via SUBCUTANEOUS
  Filled 2019-10-03: qty 1

## 2019-10-03 MED ORDER — EPOETIN ALFA-EPBX 10000 UNIT/ML IJ SOLN
INTRAMUSCULAR | Status: AC
  Filled 2019-10-03: qty 1

## 2019-10-03 MED ORDER — EPOETIN ALFA-EPBX 10000 UNIT/ML IJ SOLN
15000.0000 [IU] | INTRAMUSCULAR | Status: DC
  Administered 2019-10-03: 10000 [IU] via SUBCUTANEOUS

## 2019-10-07 ENCOUNTER — Other Ambulatory Visit: Payer: Self-pay

## 2019-10-07 ENCOUNTER — Encounter (HOSPITAL_BASED_OUTPATIENT_CLINIC_OR_DEPARTMENT_OTHER): Payer: Medicare HMO | Attending: Internal Medicine | Admitting: Internal Medicine

## 2019-10-07 DIAGNOSIS — E1142 Type 2 diabetes mellitus with diabetic polyneuropathy: Secondary | ICD-10-CM | POA: Diagnosis not present

## 2019-10-07 DIAGNOSIS — Z992 Dependence on renal dialysis: Secondary | ICD-10-CM | POA: Diagnosis not present

## 2019-10-07 DIAGNOSIS — Z87891 Personal history of nicotine dependence: Secondary | ICD-10-CM | POA: Insufficient documentation

## 2019-10-07 DIAGNOSIS — L97511 Non-pressure chronic ulcer of other part of right foot limited to breakdown of skin: Secondary | ICD-10-CM | POA: Diagnosis not present

## 2019-10-07 DIAGNOSIS — H42 Glaucoma in diseases classified elsewhere: Secondary | ICD-10-CM | POA: Diagnosis not present

## 2019-10-07 DIAGNOSIS — I509 Heart failure, unspecified: Secondary | ICD-10-CM | POA: Insufficient documentation

## 2019-10-07 DIAGNOSIS — I132 Hypertensive heart and chronic kidney disease with heart failure and with stage 5 chronic kidney disease, or end stage renal disease: Secondary | ICD-10-CM | POA: Insufficient documentation

## 2019-10-07 DIAGNOSIS — Z8249 Family history of ischemic heart disease and other diseases of the circulatory system: Secondary | ICD-10-CM | POA: Insufficient documentation

## 2019-10-07 DIAGNOSIS — E11621 Type 2 diabetes mellitus with foot ulcer: Secondary | ICD-10-CM | POA: Insufficient documentation

## 2019-10-07 DIAGNOSIS — I872 Venous insufficiency (chronic) (peripheral): Secondary | ICD-10-CM | POA: Insufficient documentation

## 2019-10-07 DIAGNOSIS — N186 End stage renal disease: Secondary | ICD-10-CM | POA: Insufficient documentation

## 2019-10-07 DIAGNOSIS — Z833 Family history of diabetes mellitus: Secondary | ICD-10-CM | POA: Insufficient documentation

## 2019-10-07 DIAGNOSIS — M109 Gout, unspecified: Secondary | ICD-10-CM | POA: Insufficient documentation

## 2019-10-07 DIAGNOSIS — E1151 Type 2 diabetes mellitus with diabetic peripheral angiopathy without gangrene: Secondary | ICD-10-CM | POA: Diagnosis not present

## 2019-10-07 DIAGNOSIS — Z809 Family history of malignant neoplasm, unspecified: Secondary | ICD-10-CM | POA: Diagnosis not present

## 2019-10-07 DIAGNOSIS — L97512 Non-pressure chronic ulcer of other part of right foot with fat layer exposed: Secondary | ICD-10-CM | POA: Diagnosis not present

## 2019-10-07 DIAGNOSIS — E1122 Type 2 diabetes mellitus with diabetic chronic kidney disease: Secondary | ICD-10-CM | POA: Diagnosis not present

## 2019-10-07 DIAGNOSIS — E1139 Type 2 diabetes mellitus with other diabetic ophthalmic complication: Secondary | ICD-10-CM | POA: Diagnosis not present

## 2019-10-07 DIAGNOSIS — Z823 Family history of stroke: Secondary | ICD-10-CM | POA: Diagnosis not present

## 2019-10-07 NOTE — Progress Notes (Signed)
DEMONI, GERGEN ([id number]) Visit Report for 10/07/2019 Abuse/Suicide Risk Screen Details Patient Name: Date of Service: Edward [name], Edward [name] 10/07/2019 10:30 AM Medical Record [id number] Patient Account Number: [id number] Date of Birth/Sex: Treating RN: 12-Jun-1971 (49 y.o. Edward [name]) Carlene Coria Primary Care Ario Mcdiarmid: [name] Other Clinician: Referring Asher Babilonia: Treating Fayth Trefry/Extender:Robson, Sammuel Cooper, Enobong Weeks in Treatment: 0 Abuse/Suicide Risk Screen Items Answer ABUSE RISK SCREEN: Has anyone close to you tried to hurt or harm you recentlyo No Do you feel uncomfortable with anyone in your familyo No Has anyone forced you do things that you didnt want to doo No Electronic Signature(s) Signed: 10/07/2019 4:44:52 PM By: Carlene Coria RN Entered By: Carlene Coria on 10/07/2019 11:23:36 -------------------------------------------------------------------------------- Activities of Daily Living Details Patient Name: Date of Service: Edward, [name] 10/07/2019 10:30 AM Medical Record Number:[id number] Patient Account Number: [id number] Date of Birth/Sex: Treating RN: [birth year] (48 y.o. Edward [name]) Carlene Coria Primary Care Freda Jaquith: [name] Other Clinician: Referring Ladrea Holladay: Treating Lalonnie Shaffer/Extender:Robson, Sammuel Cooper, Enobong Weeks in Treatment: 0 Activities of Daily Living Items Answer Activities of Daily Living (Please select one for each item) Drive Automobile Completely Able Take Medications Completely Able Use Telephone Completely Able Care for Appearance Completely Able Use Toilet Completely [place] / Shower Completely Able Dress Self Completely Able Feed Self Completely Able Walk Completely Able Get In / Out Bed Completely Able Housework Completely Able Prepare Meals Completely Able Handle Money Completely Able Shop for Self Completely Able Electronic Signature(s) Signed: 10/07/2019 4:44:52 PM By: Carlene Coria RN Entered By:  Carlene Coria on 10/07/2019 11:24:12 -------------------------------------------------------------------------------- Education Screening Details Patient Name: Date of Service: Edward [name] 10/07/2019 10:30 AM Medical Record Number:[id number] Patient Account Number: [id number] Date of Birth/Sex: Treating RN: Nov 22, 1970 (48 y.o. Oval Linsey Primary Care Tonyetta Berko: [name] Other Clinician: Referring Maigen Mozingo: Treating Lenorris Karger/Extender:Robson, Sammuel Cooper, Douglass Rivers in Treatment: 0 Primary Learner Assessed: Patient Learning Preferences/Education Level/Primary Language Learning Preference: Explanation Highest Education Level: College or Above Preferred Language: English Cognitive Barrier Language Barrier: No Translator Needed: No Memory Deficit: No Emotional Barrier: No Cultural/Religious Beliefs Affecting Medical Care: No Physical Barrier Impaired Vision: Yes Legally Blind Impaired Hearing: No Decreased Hand dexterity: No Knowledge/Comprehension Knowledge Level: Medium Comprehension Level: High Ability to understand written High instructions: Ability to understand verbal High instructions: Motivation Anxiety Level: Calm Cooperation: Cooperative Education Importance: Acknowledges Need Interest in Health Problems: Asks Questions Perception: Coherent Willingness to Engage in Self- High Management Activities: Readiness to Engage in Self- High Management Activities: Electronic Signature(s) Signed: 10/07/2019 4:44:52 PM By: Carlene Coria RN Entered By: Carlene Coria on 10/07/2019 11:25:15 -------------------------------------------------------------------------------- Fall Risk Assessment Details Patient Name: Date of Service: Edward [name] 10/07/2019 10:30 AM Medical Record [id number] Patient Account Number: [id number] Date of Birth/Sex: Treating RN: [birth year] (49 y.o. Edward [name]) Carlene Coria Primary Care Ilithyia Titzer: [name] Other  Clinician: Referring Dannica Bickham: Treating Shandricka Monroy/Extender:Robson, Sammuel Cooper, Enobong Weeks in Treatment: 0 Fall Risk Assessment Items Have you had 2 or more falls in the last 12 monthso 0 Yes Have you had any fall that resulted in injury in the last 12 monthso 0 Yes FALLS RISK SCREEN History of falling - immediate or within 3 months 0 No Secondary diagnosis (Do you have 2 or more medical diagnoseso) 0 No Ambulatory aid None/bed rest/wheelchair/nurse 0 No Crutches/cane/walker 0 No Furniture 0 No Intravenous therapy Access/Saline/Heparin Lock 0 No Weak (short steps with or without shuffle, stooped but able to lift head 0 No while walking, may seek support from furniture) Impaired (short steps with shuffle,  may have difficulty arising from chair, 0 No head down, impaired balance) Mental Status Oriented to own ability 0 No Overestimates or forgets limitations 0 No Risk Level: Low Risk Score: 0 Electronic Signature(s) Signed: 10/07/2019 4:44:52 PM By: Carlene Coria RN Entered By: Carlene Coria on 10/07/2019 11:26:07 -------------------------------------------------------------------------------- Foot Assessment Details Patient Name: Date of Service: Edward [name] 10/07/2019 10:30 AM Medical Record [id number] Patient Account Number: [id number] Date of Birth/Sex: Treating RN: 07-Dec-1970 (48 y.o. Edward [name]) Carlene Coria Primary Care Maxxwell Edgett: [name] Other Clinician: Referring Elasia Furnish: Treating Isabele Lollar/Extender:Robson, Sammuel Cooper, Enobong Weeks in Treatment: 0 Foot Assessment Items Site Locations + = Sensation present, - = Sensation absent, C = Callus, U = Ulcer R = Redness, W = Warmth, M = Maceration, PU = Pre-ulcerative lesion F = Fissure, S = Swelling, D = Dryness Assessment Right: Left: Other Deformity: No No Prior Foot Ulcer: No No Prior Amputation: No No Charcot Joint: No No Ambulatory Status: Ambulatory Without Help Gait: Steady Electronic  Signature(s) Signed: 10/07/2019 4:44:52 PM By: Carlene Coria RN Entered By: Carlene Coria on 10/07/2019 11:29:29 -------------------------------------------------------------------------------- Nutrition Risk Screening Details Patient Name: Date of Service: ALDIN, DREES 10/07/2019 10:30 AM Medical Record Number:[id number] Patient Account Number: [id number] Date of Birth/Sex: Treating RN: 01/05/1971 (48 y.o. Edward [name]) Carlene Coria Primary Care Kelden Lavallee: [name] Other Clinician: Referring Trevaun Rendleman: Treating Famous Eisenhardt/Extender:Robson, Sammuel Cooper, Enobong Weeks in Treatment: 0 Height (in): 72 Weight (lbs): 252 Body Mass Index (BMI): 34.2 Nutrition Risk Screening Items Score Screening NUTRITION RISK SCREEN: I have an illness or condition that made me change the kind and/or 0 No amount of food I eat I eat fewer than two meals per day 0 No I eat few fruits and vegetables, or milk products 0 No I have three or more drinks of beer, liquor or wine almost every day 0 No I have tooth or mouth problems that make it hard for me to eat 0 No I don't always have enough money to buy the food I need 0 No I eat alone most of the time 0 No I take three or more different prescribed or over-the-counter drugs a day 1 Yes 2 Yes Without wanting to, I have lost or gained 10 pounds in the last six months I am not always physically able to shop, cook and/or feed myself 0 No Nutrition Protocols Good Risk Protocol Provide education on Moderate Risk Protocol 0 nutrition High Risk Proctocol Risk Level: Moderate Risk Score: 3 Electronic Signature(s) Signed: 10/07/2019 4:44:52 PM By: Carlene Coria RN Entered By: Carlene Coria on 10/07/2019 11:26:38

## 2019-10-07 NOTE — Progress Notes (Signed)
Edward [name], Edward [name] ([id number]) Visit Report for 10/07/2019 Debridement Details Patient Name: Edward [name], Edward L. Date of Service: 10/07/2019 10:30 AM Medical Record Number:[id number] Patient Account Number: [id number] Date of Birth/Sex: Treating RN: 1971-08-05 (49 y.o. Edward [name] Primary Care Provider: [name] Other Clinician: Referring Provider: Treating Provider/Extender:Crew Goren, Sammuel Cooper, Douglass Rivers in Treatment: 0 Debridement Performed for Wound #1 Right,Dorsal Foot Assessment: Performed By: Physician Ricard Dillon., MD Debridement Type: Debridement Severity of Tissue Pre Fat layer exposed Debridement: Level of Consciousness (Pre- Awake and Alert procedure): Pre-procedure Verification/Time Out Taken: Yes - 12:10 Start Time: 12:10 Total Area Debrided (L x W): 2.2 (cm) x 2.6 (cm) = 5.72 (cm) Tissue and other material Non-Viable, Skin: Epidermis debrided: Level: Skin/Epidermis Debridement Description: Selective/Open Wound Instrument: Blade, Forceps Bleeding: Minimum Hemostasis Achieved: Pressure End Time: 12:11 Procedural Pain: 0 Post Procedural Pain: 0 Response to Treatment: Procedure was tolerated well Level of Consciousness Awake and Alert (Post-procedure): Post Debridement Measurements of Total Wound Length: (cm) 2.2 Width: (cm) 2.6 Depth: (cm) 0.1 Volume: (cm) 0.449 Character of Wound/Ulcer Post Improved Debridement: Severity of Tissue Post Debridement: Fat layer exposed Post Procedure Diagnosis Same as Pre-procedure Electronic Signature(s) Signed: 10/07/2019 4:56:38 PM By: Linton Ham MD Signed: 10/07/2019 5:01:35 PM By: Levan Hurst RN, BSN Entered By: Levan Hurst on 10/07/2019 12:15:57 -------------------------------------------------------------------------------- HPI Details Patient Name: Date of Service: Edward [name] 10/07/2019 10:30 AM Medical Record Number:[id number] Patient Account Number:  [id number] Date of Birth/Sex: Treating RN: May 24, 1971 (49 y.o. Edward [name] Primary Care Provider: [name] Other Clinician: Referring Provider: Treating Provider/Extender:Marge Vandermeulen, Sammuel Cooper, Douglass Rivers in Treatment: 0 History of Present Illness HPI Description: ADMISSION 10/07/2019 Patient is a 49 year old man with type 2 diabetes. For roughly 1 month he has noted a blister on the top of his right foot. He has had other blisters in this area as well. He is not aware of any footwear trauma or other blistering skin issues. He does not have a history of wounds on his feet or lower legs. He has been applying Bactroban to this area. Past medical history; stage IV chronic renal failure, hypertension, right arm fistula placed on 04/22/2019 but is not started on dialysis, history of congestive heart failure, type 2 diabetes with a hemoglobin A1c of 7.3, Graves' disease. Significant bilateral visual loss ABI in our clinic was 1.15 on the right Electronic Signature(s) Signed: 10/07/2019 4:56:38 PM By: Linton Ham MD Entered By: Linton Ham on 10/07/2019 13:01:16 -------------------------------------------------------------------------------- Physical Exam Details Patient Name: Date of Service: Edward [name]. 10/07/2019 10:30 AM Medical Record Number:[id number] Patient Account Number: [id number] Date of Birth/Sex: Treating RN: [birth year] (49 y.o. Edward [name] Primary Care Provider: [name] Other Clinician: Referring Provider: Treating Provider/Extender:Eligah Anello, Sammuel Cooper, Enobong Weeks in Treatment: 0 Constitutional Patient is hypertensive.. Pulse regular and within target range for patient.Marland Kitchen Respirations regular, non-labored and within target range.. Temperature is normal and within the target range for the patient.Marland Kitchen Appears in no distress. Eyes Conjunctivae clear. No discharge.no icterus. Respiratory work of breathing is normal. Bilateral  breath sounds are clear and equal in all lobes with no wheezes, rales or rhonchi.. Cardiovascular Heart rhythm and rate regular, without murmur or gallop. No evidence of CHF no S3 jugular venous pressure not elevated no sacral edema. Pedal pulses were palpable on the right. 3-4+ pitting bilateral lower extremity edema below the knees. No tenderness no calf tenderness. Integumentary (Hair, Skin) Skin changes of chronic venous insufficiency of her lower legs. Neurological Diabetic neuropathy. Psychiatric appears at normal  baseline. Notes Wound exam; the area in question is on the dorsal right foot a large tense blister and a smaller 1 medially. 3-4+ edema in the dorsal foot extending into the right calf and anterior tibial area. There is no evidence of cellulitis. I do not think there is evidence of a DVT Electronic Signature(s) Signed: 10/07/2019 4:56:38 PM By: Linton Ham MD Entered By: Linton Ham on 10/07/2019 13:03:05 -------------------------------------------------------------------------------- Physician Orders Details Patient Name: Date of Service: Edward [name] 10/07/2019 10:30 AM Medical Record Number:[id number] Patient Account Number: [id number] Date of Birth/Sex: Treating RN: 26-Dec-1970 (49 y.o. Edward [name] Primary Care Provider: [name] Other Clinician: Referring Provider: Treating Provider/Extender:Dianca Owensby, Sammuel Cooper, Douglass Rivers in Treatment: 0 Verbal / Phone Orders: No Diagnosis Coding Follow-up Appointments Return Appointment in 1 week. Dressing Change Frequency Wound #1 Right,Dorsal Foot Do not change entire dressing for one week. Skin Barriers/Peri-Wound Care Moisturizing lotion TCA Cream or Ointment - mixed with lotion Wound Cleansing Wound #1 Right,Dorsal Foot May shower with protection. - use cast protector Primary Wound Dressing Wound #1 Right,Dorsal Foot Calcium Alginate with Silver Secondary Dressing Wound #1  Right,Dorsal Foot Dry Gauze Edema Control 3 Layer Compression System - Right Lower Extremity Avoid standing for long periods of time Elevate legs to the level of the heart or above for 30 minutes daily and/or when sitting, a frequency of: - throughout the day Exercise regularly Electronic Signature(s) Signed: 10/07/2019 4:56:38 PM By: Linton Ham MD Signed: 10/07/2019 5:01:35 PM By: Levan Hurst RN, BSN Entered By: Levan Hurst on 10/07/2019 12:12:11 -------------------------------------------------------------------------------- Problem List Details Patient Name: Date of Service: Edward [name] 10/07/2019 10:30 AM Medical Record Number:[id number] Patient Account Number: [id number] Date of Birth/Sex: Treating RN: 14-Sep-1971 (49 y.o. Edward [name] Primary Care Provider: [name] Other Clinician: Referring Provider: Treating Provider/Extender:Flecia Shutter, Sammuel Cooper, Douglass Rivers in Treatment: 0 Active Problems ICD-10 Evaluated Encounter Code Description Active Date Today Diagnosis E11.621 Type 2 diabetes mellitus with foot ulcer 10/07/2019 No Yes L97.511 Non-pressure chronic ulcer of other part of right foot 10/07/2019 No Yes limited to breakdown of skin I87.321 Chronic venous hypertension (idiopathic) with 10/07/2019 No Yes inflammation of right lower extremity E11.42 Type 2 diabetes mellitus with diabetic polyneuropathy 10/07/2019 No Yes Inactive Problems Resolved Problems Electronic Signature(s) Signed: 10/07/2019 4:56:38 PM By: Linton Ham MD Entered By: Linton Ham on 10/07/2019 12:57:46 -------------------------------------------------------------------------------- Progress Note Details Patient Name: Date of Service: Edward [name] 10/07/2019 10:30 AM Medical Record Number:[id number] Patient Account Number: [id number] Date of Birth/Sex: Treating RN: 09-27-1971 (49 y.o. Edward [name] Primary Care Provider: [name] Other  Clinician: Referring Provider: Treating Provider/Extender:Anyelo Mccue, Sammuel Cooper, Charlane Ferretti Weeks in Treatment: 0 Subjective History of Present Illness (HPI) ADMISSION 10/07/2019 Patient is a 49 year old man with type 2 diabetes. For roughly 1 month he has noted a blister on the top of his right foot. He has had other blisters in this area as well. He is not aware of any footwear trauma or other blistering skin issues. He does not have a history of wounds on his feet or lower legs. He has been applying Bactroban to this area. Past medical history; stage IV chronic renal failure, hypertension, right arm fistula placed on 04/22/2019 but is not started on dialysis, history of congestive heart failure, type 2 diabetes with a hemoglobin A1c of 7.3, Graves' disease. Significant bilateral visual loss ABI in our clinic was 1.15 on the right Patient History Information obtained from Patient. Allergies No Known Allergies Family History Cancer - Siblings, Diabetes -  Mother,Siblings, Hypertension - Siblings,Mother, Stroke - Mother, No family history of Heart Disease, Hereditary Spherocytosis, Kidney Disease, Lung Disease, Seizures, Thyroid Problems, Tuberculosis. Social History Former smoker, Marital Status - Divorced, Alcohol Use - Rarely, Drug Use - Prior History - pot, Caffeine Use - Rarely. Medical History Eyes Patient has history of Cataracts, Glaucoma Denies history of Optic Neuritis Ear/Nose/Mouth/Throat Denies history of Chronic sinus problems/congestion, Middle ear problems Hematologic/Lymphatic Patient has history of Anemia Denies history of Hemophilia, Human Immunodeficiency Virus, Lymphedema, Sickle Cell Disease Respiratory Denies history of Aspiration, Asthma, Chronic Obstructive Pulmonary Disease (COPD), Pneumothorax, Sleep Apnea, Tuberculosis Cardiovascular Patient has history of Congestive Heart Failure, Hypertension Denies history of Angina, Arrhythmia, Coronary Artery  Disease, Deep Vein Thrombosis, Hypotension, Myocardial Infarction, Peripheral Arterial Disease, Peripheral Venous Disease, Phlebitis, Vasculitis Gastrointestinal Denies history of Cirrhosis , Colitis, Crohnoos, Hepatitis A, Hepatitis B, Hepatitis C Endocrine Patient has history of Type II Diabetes Denies history of Type I Diabetes Genitourinary Patient has history of End Stage Renal Disease - stage 4 Immunological Denies history of Lupus Erythematosus, Raynaudoos, Scleroderma Integumentary (Skin) Denies history of History of Burn Musculoskeletal Patient has history of Gout Denies history of Rheumatoid Arthritis, Osteoarthritis, Osteomyelitis Neurologic Patient has history of Neuropathy Denies history of Dementia, Quadriplegia, Paraplegia, Seizure Disorder Oncologic Denies history of Received Chemotherapy, Received Radiation Psychiatric Denies history of Anorexia/bulimia, Confinement Anxiety Patient is treated with Oral Agents. Blood sugar is not tested. Medical And Surgical History Notes Endocrine Graves disease Review of Systems (ROS) Constitutional Symptoms (General Health) Denies complaints or symptoms of Fatigue, Fever, Chills, Marked Weight Change. Eyes Denies complaints or symptoms of Dry Eyes, Vision Changes, Glasses / Contacts. Ear/Nose/Mouth/Throat Denies complaints or symptoms of Chronic sinus problems or rhinitis. Respiratory Denies complaints or symptoms of Chronic or frequent coughs, Shortness of Breath. Cardiovascular Denies complaints or symptoms of Chest pain. Gastrointestinal Denies complaints or symptoms of Frequent diarrhea, Nausea, Vomiting. Endocrine Denies complaints or symptoms of Heat/cold intolerance. Genitourinary Denies complaints or symptoms of Frequent urination. Integumentary (Skin) Complains or has symptoms of Wounds. Musculoskeletal Denies complaints or symptoms of Muscle Pain, Muscle Weakness. Neurologic Denies complaints or symptoms  of Numbness/parasthesias. Psychiatric Denies complaints or symptoms of Claustrophobia, Suicidal. Objective Constitutional Patient is hypertensive.. Pulse regular and within target range for patient.Marland Kitchen Respirations regular, non-labored and within target range.. Temperature is normal and within the target range for the patient.Marland Kitchen Appears in no distress. Vitals Time Taken: 11:13 AM, Height: 72 in, Source: Stated, Weight: 252 lbs, Source: Stated, BMI: 34.2, Temperature: 98.4 F, Pulse: 86 bpm, Respiratory Rate: 16 breaths/min, Blood Pressure: 179/89 mmHg. Eyes Conjunctivae clear. No discharge.no icterus. Respiratory work of breathing is normal. Bilateral breath sounds are clear and equal in all lobes with no wheezes, rales or rhonchi.. Cardiovascular Heart rhythm and rate regular, without murmur or gallop. No evidence of CHF no S3 jugular venous pressure not elevated no sacral edema. Pedal pulses were palpable on the right. 3-4+ pitting bilateral lower extremity edema below the knees. No tenderness no calf tenderness. Neurological Diabetic neuropathy. Psychiatric appears at normal baseline. General Notes: Wound exam; the area in question is on the dorsal right foot a large tense blister and a smaller 1 medially. 3-4+ edema in the dorsal foot extending into the right calf and anterior tibial area. There is no evidence of cellulitis. I do not think there is evidence of a DVT Integumentary (Hair, Skin) Skin changes of chronic venous insufficiency of her lower legs. Wound #1 status is Open. Original cause of wound was Blister.  The wound is located on the Right,Dorsal Foot. The wound measures 2.2cm length x 2.6cm width x 0.1cm depth; 4.492cm^2 area and 0.449cm^3 volume. There is Fat Layer (Subcutaneous Tissue) Exposed exposed. There is no tunneling or undermining noted. There is a medium amount of serosanguineous drainage noted. The wound margin is flat and intact. There is large (67-100%)  pink granulation within the wound bed. There is no necrotic tissue within the wound bed. 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 There was a Selective/Open Wound Skin/Epidermis Debridement with a total area of 5.72 sq cm performed by Ricard Dillon., MD. With the following instrument(s): Blade, and Forceps to remove Non-Viable tissue/material. Material removed includes Skin: Epidermis. No specimens were taken. A time out was conducted at 12:10, prior to the start of the procedure. A Minimum amount of bleeding was controlled with Pressure. The procedure was tolerated well with a pain level of 0 throughout and a pain level of 0 following the procedure. Post Debridement Measurements: 2.2cm length x 2.6cm width x 0.1cm depth; 0.449cm^3 volume. Character of Wound/Ulcer Post Debridement is improved. Severity of Tissue Post Debridement is: Fat layer exposed. Post procedure Diagnosis Wound #1: Same as Pre-Procedure Pre-procedure diagnosis of Wound #1 is a Diabetic Wound/Ulcer of the Lower Extremity located on the Right,Dorsal Foot . There was a Three Layer Compression Therapy Procedure by Levan Hurst, RN. Post procedure Diagnosis Wound #1: Same as Pre-Procedure Plan Follow-up Appointments: Return Appointment in 1 week. Dressing Change Frequency: Wound #1 Right,Dorsal Foot: Do not change entire dressing for one week. Skin Barriers/Peri-Wound Care: Moisturizing lotion TCA Cream or Ointment - mixed with lotion Wound Cleansing: Wound #1  Right,Dorsal Foot: May shower with protection. - use cast protector Primary Wound Dressing: Wound #1 Right,Dorsal Foot: Calcium Alginate with Silver Secondary Dressing: Wound #1 Right,Dorsal Foot: Dry Gauze Edema Control: 3 Layer Compression System - Right Lower Extremity Avoid standing for long periods of time Elevate legs to the level of the heart or above for 30 minutes daily and/or when sitting, a frequency of: - throughout the day Exercise regularly 1. We put silver alginate on the wound itself. TCA, gauze and 3 layer compression on the right lower extremity. The blister was excised with pickups and a #15 scalpel. 2. He has dry excoriated skin in 3-4+ pitting edema in the dorsal foot and lower calf. I think a lot of this is chronic venous insufficiency. Although he has good reason to be systemically fluid volume overloaded I see none of this on cardio or pulmonary examinations. He is on 100 mg of torsemide a day 3. He is already offloading these areas by wearing his own sandals I think this is appropriate 4. He is eventually going to need compression stockings. 5. With regards to the blister itself I think this is a combination of systemic fluid volume overload may be some friction on footwear. I do not believe this is infected and I do not believe that he has a primary blistering skin disorder Electronic Signature(s) Signed: 10/07/2019 4:56:38 PM By: Linton Ham MD Entered By: Linton Ham on 10/07/2019 13:05:00 -------------------------------------------------------------------------------- HxROS Details Patient Name:  Date of Service: Edward [name], Edward [name] 10/07/2019 10:30 AM Medical Record Number:[id number] Patient Account Number: [id number] Date of Birth/Sex: Treating RN: Mar 30, 1971 (48 y.o. [name]) Carlene Coria Primary Care Provider: [name] Other Clinician: Referring Provider: Treating Provider/Extender:Quincey Quesinberry, Sammuel Cooper, Enobong Weeks in Treatment:  0 Information Obtained From Patient Constitutional Symptoms (General Health) Complaints and Symptoms: Negative for: Fatigue; Fever; Chills; Marked Weight Change Eyes Complaints and Symptoms: Negative for: Dry Eyes; Vision Changes; Glasses / Contacts Medical History: Positive for: Cataracts; Glaucoma Negative for: Optic Neuritis Ear/Nose/Mouth/Throat Complaints and Symptoms: Negative for: Chronic sinus problems or rhinitis Medical History: Negative for: Chronic sinus problems/congestion; Middle ear problems Respiratory Complaints and Symptoms: Negative for: Chronic or frequent coughs; Shortness of Breath Medical History: Negative for: Aspiration; Asthma; Chronic Obstructive Pulmonary Disease (COPD); Pneumothorax; Sleep Apnea; Tuberculosis Cardiovascular Complaints and Symptoms: Negative for: Chest pain Medical History: Positive for: Congestive Heart Failure; Hypertension Negative for: Angina; Arrhythmia; Coronary Artery Disease; Deep Vein Thrombosis; Hypotension; Myocardial Infarction; Peripheral Arterial Disease; Peripheral Venous Disease; Phlebitis; Vasculitis Gastrointestinal Complaints and Symptoms: Negative for: Frequent diarrhea; Nausea; Vomiting Medical History: Negative for: Cirrhosis ; Colitis; Crohns; Hepatitis A; Hepatitis B; Hepatitis C Endocrine Complaints and Symptoms: Negative for: Heat/cold intolerance Medical History: Positive for: Type II Diabetes Negative for: Type I Diabetes Past Medical History Notes: Graves disease Time with diabetes: 15 years Treated with: Oral agents Blood sugar tested every day: No Genitourinary Complaints and Symptoms: Negative for: Frequent urination Medical History: Positive for: End Stage Renal Disease - stage 4 Integumentary (Skin) Complaints and Symptoms: Positive for: Wounds Medical History: Negative for: History of Burn Musculoskeletal Complaints and Symptoms: Negative for: Muscle Pain; Muscle Weakness Medical  History: Positive for: Gout Negative for: Rheumatoid Arthritis; Osteoarthritis; Osteomyelitis Neurologic Complaints and Symptoms: Negative for: Numbness/parasthesias Medical History: Positive for: Neuropathy Negative for: Dementia; Quadriplegia; Paraplegia; Seizure Disorder Psychiatric Complaints and Symptoms: Negative for: Claustrophobia; Suicidal Medical History: Negative for: Anorexia/bulimia; Confinement Anxiety Hematologic/Lymphatic Medical History: Positive for: Anemia Negative for: Hemophilia; Human Immunodeficiency Virus; Lymphedema; Sickle Cell Disease Immunological Medical History: Negative for: Lupus Erythematosus; Raynauds; Scleroderma Oncologic Medical History: Negative for: Received Chemotherapy; Received Radiation HBO Extended History Items Eyes: Eyes: Cataracts Glaucoma Immunizations Pneumococcal Vaccine: Received Pneumococcal Vaccination: No Implantable Devices None Family and Social History Cancer: Yes - Siblings; Diabetes: Yes - Mother,Siblings; Heart Disease: No; Hereditary Spherocytosis: No; Hypertension: Yes - Siblings,Mother; Kidney Disease: No; Lung Disease: No; Seizures: No; Stroke: Yes - Mother; Thyroid Problems: No; Tuberculosis: No; Former smoker; Marital Status - Divorced; Alcohol Use: Rarely; Drug Use: Prior History - pot; Caffeine Use: Rarely; Financial Concerns: No; Food, Clothing or Shelter Needs: No; Support System Lacking: No; Transportation Concerns: No Electronic Signature(s) Signed: 10/07/2019 4:44:52 PM By: Carlene Coria RN Signed: 10/07/2019 4:56:38 PM By: Linton Ham MD Entered By: Carlene Coria on 10/07/2019 11:23:25 -------------------------------------------------------------------------------- SuperBill Details Patient Name: Date of Service: Edward [name] 10/07/2019 Medical Record Number:[id number] Patient Account Number: [id number] Date of Birth/Sex: Treating RN: [birth year] (49 y.o. 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 E11.42 Type 2 diabetes mellitus with diabetic polyneuropathy Facility Procedures CPT4 Code Description: [id number] 99213 - WOUND CARE VISIT-LEV 3 EST PT Modifier: 25 Quantity: 1 CPT4 Code Description: [id number] -  DEBRIDE WOUND 1ST 20 SQ CM OR < ICD-10 Diagnosis Description L97.511 Non-pressure chronic ulcer of other part of right foot limite Modifier: d to breakdown Quantity: 1 of skin Physician Procedures CPT4 Code Description: [id number] WC PHYS LEVEL 3 NEW PT ICD-10 Diagnosis Description E11.621 Type 2 diabetes mellitus with foot ulcer L97.511 Non-pressure chronic ulcer of other part of right foot lim I87.321 Chronic venous hypertension (idiopathic) with  inflammation extremity E11.42 Type 2 diabetes mellitus with diabetic polyneuropathy Modifier: 25 ited to breakd of right lowe Quantity: 1 own of skin r CPT4 Code Description: [id number] - WC PHYS DEBR WO ANESTH 20 SQ CM ICD-10 Diagnosis Description L97.511 Non-pressure chronic ulcer of other part of right foot limite Modifier: d to breakdown Quantity: 1 of skin Electronic Signature(s) Signed: 10/07/2019 4:56:38 PM By: Linton Ham MD Signed: 10/07/2019 5:01:35 PM By: Levan Hurst RN, BSN Entered By: Levan Hurst on 10/07/2019 16:46:07

## 2019-10-07 NOTE — Progress Notes (Addendum)
Edward [name], STMARTIN ([id number]) Visit Report for 10/07/2019 Allergy List Details Patient Name: Date of Service: ISON, WICHMANN 10/07/2019 10:30 AM Medical Record [id number] Patient Account Number: [id number] Date of Birth/Sex: Treating RN: 10-08-1970 (49 y.o. Edward [name]) Carlene Coria Primary Care Conlin Brahm: [name] Other Clinician: Referring Mylo Driskill: Treating Surena Welge/Extender:Robson, Sammuel Cooper, Enobong Weeks in Treatment: 0 Allergies Active Allergies No Known Allergies Allergy Notes Electronic Signature(s) Signed: 10/07/2019 4:44:52 PM By: Carlene Coria RN Entered By: Carlene Coria on 10/07/2019 11:14:21 -------------------------------------------------------------------------------- [place] Details Patient Name: Date of Service: Edward [name] 10/07/2019 10:30 AM Medical Record Number:[id number] Patient Account Number: [id number] Date of Birth/Sex: Treating RN: Jun 14, 1971 (49 y.o. Oval Linsey Primary Care Kyzer Blowe: [name] Other Clinician: Referring Lonell Stamos: Treating Pranay Hilbun/Extender:Robson, Sammuel Cooper, Douglass Rivers in Treatment: 0 Visit Information Patient Arrived: Ambulatory Arrival Time: 11:03 Accompanied By: self Transfer Assistance: None Patient Identification Verified: Yes Secondary Verification Process Completed: Yes Patient Requires Transmission-Based No Precautions: Patient Has Alerts: No Electronic Signature(s) Signed: 10/07/2019 4:44:52 PM By: Carlene Coria RN Entered By: Carlene Coria on 10/07/2019 11:13:38 -------------------------------------------------------------------------------- Clinic Level of Care Assessment Details Patient Name: Date of Service: AMADEUS, OYAMA 10/07/2019 10:30 AM Medical Record Number:[id number] Patient Account Number: [id number] Date of Birth/Sex: Treating RN: [birth year] (49 y.o. Janyth Contes Primary Care Lorenza Winkleman: [name] Other Clinician: Referring Masiya Claassen:  Treating Romana Deaton/Extender:Robson, Sammuel Cooper, Enobong Weeks in Treatment: 0 Clinic Level of Care Assessment Items TOOL 1 Quantity Score X - Use when EandM and Procedure is performed on INITIAL visit 1 0 ASSESSMENTS - Nursing Assessment / Reassessment X - General Physical Exam (combine w/ comprehensive assessment (listed just below) 1 20 when performed on new pt. evals) X - Comprehensive Assessment (HX, ROS, Risk Assessments, Wounds Hx, etc.) 1 25 ASSESSMENTS - Wound and Skin Assessment / Reassessment []  - Dermatologic / Skin Assessment (not related to wound area) 0 ASSESSMENTS - Ostomy and/or Continence Assessment and Care []  - Incontinence Assessment and Management 0 []  - Ostomy Care Assessment and Management (repouching, etc.) 0 PROCESS - Coordination of Care X - Simple Patient / Family Education for ongoing care 1 15 []  - Complex (extensive) Patient / Family Education for ongoing care 0 X - Staff obtains Programmer, systems, Records, Test Results / Process Orders 1 10 []  - Staff telephones HHA, Nursing Homes / Clarify orders / etc 0 []  - Routine Transfer to another Facility (non-emergent condition) 0 []  - Routine Hospital Admission (non-emergent condition) 0 X - New Admissions / Biomedical engineer / Ordering NPWT, Apligraf, etc. 1 15 []  - Emergency Hospital Admission (emergent condition) 0 PROCESS - Special Needs []  - Pediatric / Minor Patient Management 0 []  - Isolation Patient Management 0 []  - Hearing / Language / Visual special needs 0 []  - Assessment of Community assistance (transportation, D/C planning, etc.) 0 []  - Additional assistance / Altered mentation 0 []  - Support Surface(s) Assessment (bed, cushion, seat, etc.) 0 INTERVENTIONS - Miscellaneous []  - External ear exam 0 []  - Patient Transfer (multiple staff / Civil Service fast streamer / Similar devices) 0 []  - Simple Staple / Suture removal (25 or less) 0 []  - Complex Staple / Suture removal (26 or more) 0 []  - Hypo/Hyperglycemic  Management (do not check if billed separately) 0 X - Ankle / Brachial Index (ABI) - do not check if billed separately 1 15 Has the patient been seen at the hospital within the last three years: Yes Total Score: 100 Level Of Care: New/Established - Level 3 Electronic Signature(s) Signed: 10/07/2019 5:01:35 PM  By: Levan Hurst RN, BSN Entered By: Levan Hurst on 10/07/2019 16:45:21 -------------------------------------------------------------------------------- Compression Therapy Details Patient Name: [name], Edward L. Date of Service: 10/07/2019 10:30 AM Medical Record Number:[id number] Patient Account Number: [id number] Date of Birth/Sex: Treating RN: 1971/08/10 (49 y.o. Janyth Contes Primary Care Yohana Bartha: [name] Other Clinician: Referring Jakari Jacot: Treating Malaia Buchta/Extender:Robson, Sammuel Cooper, Enobong Weeks in Treatment: 0 Compression Therapy Performed for Wound Wound #1 Right,Dorsal Foot Assessment: Performed By: Clinician Levan Hurst, RN Compression Type: Three Layer Post Procedure Diagnosis Same as Pre-procedure Electronic Signature(s) Signed: 10/07/2019 5:01:35 PM By: Levan Hurst RN, BSN Entered By: Levan Hurst on 10/07/2019 12:16:28 -------------------------------------------------------------------------------- Encounter Discharge Information Details Patient Name: Date of Service: Edward [name] 10/07/2019 10:30 AM Medical Record Number:[id number] Patient Account Number: [id number] Date of Birth/Sex: Treating RN: 21-Jun-1971 (49 y.o. Edward [name] Primary Care Randall Rampersad: [name] Other Clinician: Referring Mykenzie Ebanks: Treating Korvin Valentine/Extender:Robson, Sammuel Cooper, Douglass Rivers in Treatment: 0 Encounter Discharge Information Items Post Procedure Vitals Discharge Condition: Stable Temperature (F): 98.4 Ambulatory Status: Ambulatory Pulse (bpm): 86 Discharge Destination: Home Respiratory Rate (breaths/min):  16 Transportation: Private Auto Blood Pressure (mmHg): 179/89 Accompanied By: self Schedule Follow-up Appointment: Yes Clinical Summary of Care: Electronic Signature(s) Signed: 10/07/2019 4:44:32 PM By: Deon Pilling Entered By: Deon Pilling on 10/07/2019 12:34:37 -------------------------------------------------------------------------------- Lower Extremity Assessment Details Patient Name: Date of Service: CHIOKE, NOXON 10/07/2019 10:30 AM Medical Record [id number] Patient Account Number: [id number] Date of Birth/Sex: Treating RN: 1971/09/28 (48 y.o. Oval Linsey Primary Care Broughton Eppinger: [name] Other Clinician: Referring Dezzie Badilla: Treating Tannah Dreyfuss/Extender:Robson, Sammuel Cooper, Charlane Ferretti Weeks in Treatment: 0 Edema Assessment Assessed: [Left: No] [Right: Yes] Edema: [Left: Ye] [Right: s] Calf Left: Right: Point of Measurement: 41 cm From Medial Instep cm 43 cm Ankle Left: Right: Point of Measurement: 12 cm From Medial Instep cm 29 cm Vascular Assessment Blood Pressure: Brachial: [Right:174] Ankle: [Right:Dorsalis Pedis: 200 1.15] Electronic Signature(s) Signed: 10/07/2019 4:44:52 PM By: Carlene Coria RN Entered By: Carlene Coria on 10/07/2019 11:42:07 -------------------------------------------------------------------------------- Multi Wound Chart Details Patient Name: Date of Service: Edward [name]. 10/07/2019 10:30 AM Medical Record Number:[id number] Patient Account Number: [id number] Date of Birth/Sex: Treating RN: 08-May-1971 (49 y.o. Janyth Contes Primary Care Annamarie Yamaguchi: [name] Other Clinician: Referring Alailah Safley: Treating Chella Chapdelaine/Extender:Robson, Sammuel Cooper, Enobong Weeks in Treatment: 0 Vital Signs Height(in): 72 Pulse(bpm): 86 Weight(lbs): 252 Blood Pressure(mmHg): 179/89 Body Mass Index(BMI): 34 Temperature(F): 98.4 Respiratory 16 Rate(breaths/min): Photos: [1:No Photos] [N/A:N/A] Wound Location: [1:Right  Foot - Dorsal] [N/A:N/A] Wounding Event: [1:Blister] [N/A:N/A] Primary Etiology: [1:Diabetic Wound/Ulcer of the N/A Lower Extremity] Comorbid History: [1:Cataracts, Glaucoma, Anemia, Congestive Heart Failure, Hypertension, Type II Diabetes, End Stage Renal Disease, Gout, Neuropathy] [N/A:N/A] Date Acquired: [1:09/03/2019] [N/A:N/A] Weeks of Treatment: [1:0] [N/A:N/A] Wound Status: [1:Open] [N/A:N/A] Measurements L x W x D 2.2x2.6x0.1 [N/A:N/A] (cm) Area (cm) : [1:4.492] [N/A:N/A] Volume (cm) : [1:0.449] [N/A:N/A] Classification: [1:Grade 2] [N/A:N/A] Exudate Amount: [1:Medium] [N/A:N/A] Exudate Type: [1:Serosanguineous] [N/A:N/A] Exudate Color: [1:red, brown] [N/A:N/A] Wound Margin: [1:Flat and Intact] [N/A:N/A] Granulation Amount: [1:Large (67-100%)] [N/A:N/A] Granulation Quality: [1:Pink] [N/A:N/A] Necrotic Amount: [1:None Present (0%)] [N/A:N/A] Exposed Structures: [1:Fat Layer (Subcutaneous Tissue) Exposed: Yes Fascia: No Tendon: No Muscle: No Joint: No Bone: No] [N/A:N/A] Epithelialization: [1:None] [N/A:N/A] Debridement: [1:Debridement - Selective/Open Wound] [N/A:N/A] Pre-procedure [1:12:10] [N/A:N/A] Verification/Time Out Taken: Level: [1:Skin/Epidermis] [N/A:N/A] Debridement Area (sq cm):5.72 [N/A:N/A] Instrument: [1:Blade, Forceps] [N/A:N/A] Bleeding: [1:Minimum] [N/A:N/A] Hemostasis Achieved: [1:Pressure] [N/A:N/A] Procedural Pain: [1:0] [N/A:N/A] Post Procedural Pain: [1:0] [N/A:N/A] Debridement Treatment Procedure was tolerated [N/A:N/A] Response: [1:well] Post Debridement [1:2.2x2.6x0.1] [N/A:N/A]  Measurements L x W x D (cm) Post Debridement [1:0.449] [N/A:N/A] Volume: (cm) Procedures Performed: Compression Therapy [1:Debridement] [N/A:N/A] Treatment Notes Wound #1 (Right, Dorsal Foot) 1. Cleanse With Wound Cleanser 2. Periwound Care Moisturizing lotion TCA Cream 3. Primary Dressing Applied Calcium Alginate Ag 4. Secondary Dressing Dry Gauze 6.  Support Layer Applied 3 layer compression wrap Notes netting. explained the orders, dressings, frequency of change, and when to return to wound center. patient in agreement. Electronic Signature(s) Signed: 10/07/2019 4:56:38 PM By: Linton Ham MD Signed: 10/07/2019 5:01:35 PM By: Levan Hurst RN, BSN Entered By: Linton Ham on 10/07/2019 12:57:52 -------------------------------------------------------------------------------- Multi-Disciplinary Care Plan Details Patient Name: Date of Service: KHALEL, ALMS 10/07/2019 10:30 AM Medical Record Number:[id number] Patient Account Number: [id number] Date of Birth/Sex: Treating RN: 10-05-1970 (49 y.o. Janyth Contes Primary Care Kadeidra Coryell: [name] Other Clinician: Referring Peni Rupard: Treating Randall Rampersad/Extender:Robson, Sammuel Cooper, Douglass Rivers in Treatment: 0 Active Inactive Nutrition Nursing Diagnoses: Impaired glucose control: actual or potential Potential for alteratiion in Nutrition/Potential for imbalanced nutrition Goals: Patient/caregiver agrees to and verbalizes understanding of need to use nutritional supplements and/or vitamins as prescribed Date Initiated: 10/07/2019 Target Resolution Date: 11/08/2019 Goal Status: Active Patient/caregiver will maintain therapeutic glucose control Date Initiated: 10/07/2019 Target Resolution Date: 11/08/2019 Goal Status: Active Interventions: Assess HgA1c results as ordered upon admission and as needed Assess patient nutrition upon admission and as needed per policy Provide education on elevated blood sugars and impact on wound healing Provide education on nutrition Notes: Wound/Skin Impairment Nursing Diagnoses: Impaired tissue integrity Knowledge deficit related to ulceration/compromised skin integrity Goals: Patient/caregiver will verbalize understanding of skin care regimen Date Initiated: 10/07/2019 Target Resolution Date: 11/08/2019 Goal Status:  Active Ulcer/skin breakdown will have a volume reduction of 30% by week 4 Date Initiated: 10/07/2019 Target Resolution Date: 11/08/2019 Goal Status: Active Interventions: Assess patient/caregiver ability to obtain necessary supplies Assess patient/caregiver ability to perform ulcer/skin care regimen upon admission and as needed Assess ulceration(s) every visit Provide education on ulcer and skin care Notes: Electronic Signature(s) Signed: 10/07/2019 5:01:35 PM By: Levan Hurst RN, BSN Entered By: Levan Hurst on 10/07/2019 15:41:52 -------------------------------------------------------------------------------- Patient/Caregiver Education Details Vaeth, Freeland 1/4/2021andnbsp10:30 Patient Name: Date of Service: L. AM Medical Record Patient Account Number: [id number] Number: Treating RN: Levan Hurst Date of Birth/Gender: 1971/07/09 (49 y.o. M) Other Clinician: Primary Care Physician:Newlin, Charlane Ferretti Treating Linton Ham Referring Physician: Physician/Extender: Evette Georges in Treatment: 0 Education Assessment Education Provided To: Patient Education Topics Provided Nutrition: Handouts: Elevated Blood Sugars: How Do They Affect Wound Healing Methods: Explain/Verbal, Printed Responses: State content correctly Venous: Handouts: Controlling Swelling with Multilayered Compression Wraps Methods: Explain/Verbal, Printed Responses: State content correctly Wound/Skin Impairment: Methods: Explain/Verbal Responses: State content correctly Electronic Signature(s) Signed: 10/07/2019 5:01:35 PM By: Levan Hurst RN, BSN Entered By: Levan Hurst on 10/07/2019 12:13:31 -------------------------------------------------------------------------------- Wound Assessment Details Patient Name: Date of Service: Edward [name] 10/07/2019 10:30 AM Medical Record Number:[id number] Patient Account Number: [id number] Date of Birth/Sex: Treating  RN: 05/06/1971 (49 y.o. Janyth Contes Primary Care Dyanna Seiter: [name] Other Clinician: Referring Bela Nyborg: Treating Machi Whittaker/Extender:Robson, Sammuel Cooper, Enobong Weeks in Treatment: 0 Wound Status Wound Number: 1 Primary Diabetic Wound/Ulcer of the Lower Extremity Etiology: Wound Location: Right Foot - Dorsal Wound Open Wounding Event: Blister Status: Date Acquired: 09/03/2019 Comorbid Cataracts, Glaucoma, Anemia, Congestive Weeks Of Treatment: 0 History: Heart Failure, Hypertension, Type II Diabetes, Clustered Wound: No End Stage Renal Disease, Gout, Neuropathy Photos Wound Measurements Length: (cm) 2.2 Width: (cm) 2.6 Depth: (cm) 0.1 Area: (cm)  4.492 Volume: (cm) 0.449 Wound Description Classification: Grade 2 Wound Margin: Flat and Intact Exudate Amount: Medium Exudate Type: Serosanguineous Exudate Color: red, brown Wound Bed Granulation Amount: Large (67-100%) Granulation Quality: Pink Necrotic Amount: None Present (0%) After Cleansing: No brino No Exposed Structure osed: No (Subcutaneous Tissue) Exposed: Yes osed: No osed: No sed: No ed: No % Reduction in Area: 0% % Reduction in Volume: 0% Epithelialization: None Tunneling: No Undermining: No Foul Odor Slough/Fi Fascia Exp Fat Layer Tendon Exp Muscle Exp Joint Expo Bone Expos Electronic Signature(s) Signed: 10/08/2019 3:29:37 PM By: Mikeal Hawthorne EMT/HBOT Signed: 11/28/2019 2:23:51 PM By: Levan Hurst RN, BSN Previous Signature: 10/07/2019 5:01:35 PM Version By: Levan Hurst RN, BSN Entered By: Mikeal Hawthorne on 10/08/2019 14:22:53 -------------------------------------------------------------------------------- [place] Details Patient Name: Date of Service: Edward [name]. 10/07/2019 10:30 AM Medical Record [id number] Patient Account Number: [id number] Date of Birth/Sex: Treating RN: 1970-11-18 (48 y.o. Staci Acosta, [place] Primary Care Analiah Drum: [name] Other  Clinician: Referring Treyshon Buchanon: Treating Matias Thurman/Extender:Robson, Sammuel Cooper, Enobong Weeks in Treatment: 0 Vital Signs Time Taken: 11:13 Temperature (F): 98.4 Height (in): 72 Pulse (bpm): 86 Source: Stated Respiratory Rate (breaths/min): 16 Weight (lbs): 252 Blood Pressure (mmHg): 179/89 Source: Stated Reference Range: 80 - 120 mg / dl Body Mass Index (BMI): 34.2 Electronic Signature(s) Signed: 10/07/2019 4:44:52 PM By: Carlene Coria RN Entered By: Carlene Coria on 10/07/2019 11:14:13

## 2019-10-14 ENCOUNTER — Other Ambulatory Visit: Payer: Self-pay

## 2019-10-14 ENCOUNTER — Encounter (HOSPITAL_BASED_OUTPATIENT_CLINIC_OR_DEPARTMENT_OTHER): Payer: Medicare HMO | Admitting: Internal Medicine

## 2019-10-14 DIAGNOSIS — E11621 Type 2 diabetes mellitus with foot ulcer: Secondary | ICD-10-CM | POA: Diagnosis not present

## 2019-10-14 DIAGNOSIS — L97511 Non-pressure chronic ulcer of other part of right foot limited to breakdown of skin: Secondary | ICD-10-CM | POA: Diagnosis not present

## 2019-10-14 DIAGNOSIS — Z992 Dependence on renal dialysis: Secondary | ICD-10-CM | POA: Diagnosis not present

## 2019-10-14 DIAGNOSIS — E1142 Type 2 diabetes mellitus with diabetic polyneuropathy: Secondary | ICD-10-CM | POA: Diagnosis not present

## 2019-10-14 DIAGNOSIS — I132 Hypertensive heart and chronic kidney disease with heart failure and with stage 5 chronic kidney disease, or end stage renal disease: Secondary | ICD-10-CM | POA: Diagnosis not present

## 2019-10-14 DIAGNOSIS — N184 Chronic kidney disease, stage 4 (severe): Secondary | ICD-10-CM | POA: Diagnosis not present

## 2019-10-14 DIAGNOSIS — L97512 Non-pressure chronic ulcer of other part of right foot with fat layer exposed: Secondary | ICD-10-CM | POA: Diagnosis not present

## 2019-10-14 DIAGNOSIS — I509 Heart failure, unspecified: Secondary | ICD-10-CM | POA: Diagnosis not present

## 2019-10-14 DIAGNOSIS — E1122 Type 2 diabetes mellitus with diabetic chronic kidney disease: Secondary | ICD-10-CM | POA: Diagnosis not present

## 2019-10-14 DIAGNOSIS — N186 End stage renal disease: Secondary | ICD-10-CM | POA: Diagnosis not present

## 2019-10-14 DIAGNOSIS — I872 Venous insufficiency (chronic) (peripheral): Secondary | ICD-10-CM | POA: Diagnosis not present

## 2019-10-15 ENCOUNTER — Ambulatory Visit: Payer: Medicare HMO | Attending: Family Medicine | Admitting: Family Medicine

## 2019-10-15 ENCOUNTER — Other Ambulatory Visit: Payer: Self-pay

## 2019-10-15 ENCOUNTER — Encounter: Payer: Self-pay | Admitting: Family Medicine

## 2019-10-15 ENCOUNTER — Ambulatory Visit: Payer: Medicare HMO | Admitting: Family Medicine

## 2019-10-15 VITALS — BP 169/90 | HR 92 | Ht 72.0 in | Wt 264.0 lb

## 2019-10-15 DIAGNOSIS — N2581 Secondary hyperparathyroidism of renal origin: Secondary | ICD-10-CM | POA: Diagnosis not present

## 2019-10-15 DIAGNOSIS — E0500 Thyrotoxicosis with diffuse goiter without thyrotoxic crisis or storm: Secondary | ICD-10-CM

## 2019-10-15 DIAGNOSIS — D631 Anemia in chronic kidney disease: Secondary | ICD-10-CM | POA: Diagnosis not present

## 2019-10-15 DIAGNOSIS — E1122 Type 2 diabetes mellitus with diabetic chronic kidney disease: Secondary | ICD-10-CM

## 2019-10-15 DIAGNOSIS — N185 Chronic kidney disease, stage 5: Secondary | ICD-10-CM | POA: Diagnosis not present

## 2019-10-15 DIAGNOSIS — E872 Acidosis: Secondary | ICD-10-CM | POA: Diagnosis not present

## 2019-10-15 DIAGNOSIS — R140 Abdominal distension (gaseous): Secondary | ICD-10-CM | POA: Diagnosis not present

## 2019-10-15 DIAGNOSIS — I120 Hypertensive chronic kidney disease with stage 5 chronic kidney disease or end stage renal disease: Secondary | ICD-10-CM | POA: Diagnosis not present

## 2019-10-15 DIAGNOSIS — R600 Localized edema: Secondary | ICD-10-CM | POA: Diagnosis not present

## 2019-10-15 DIAGNOSIS — I770 Arteriovenous fistula, acquired: Secondary | ICD-10-CM | POA: Diagnosis not present

## 2019-10-15 DIAGNOSIS — M2559 Pain in other specified joint: Secondary | ICD-10-CM | POA: Diagnosis not present

## 2019-10-15 DIAGNOSIS — Z9114 Patient's other noncompliance with medication regimen: Secondary | ICD-10-CM

## 2019-10-15 DIAGNOSIS — N184 Chronic kidney disease, stage 4 (severe): Secondary | ICD-10-CM

## 2019-10-15 LAB — POCT GLYCOSYLATED HEMOGLOBIN (HGB A1C): HbA1c, POC (controlled diabetic range): 7.9 % — AB (ref 0.0–7.0)

## 2019-10-15 LAB — GLUCOSE, POCT (MANUAL RESULT ENTRY): POC Glucose: 261 mg/dl — AB (ref 70–99)

## 2019-10-15 MED ORDER — GLIPIZIDE 5 MG PO TABS: 2.5000 mg | ORAL_TABLET | Freq: Every day | ORAL | 6 refills | Status: DC

## 2019-10-15 MED FILL — TORSEMIDE 100 MG TABLET: 100 | 30 days supply | Qty: 60 | Fill #0

## 2019-10-15 NOTE — Progress Notes (Signed)
Back and knee pain.

## 2019-10-15 NOTE — Patient Instructions (Signed)
Edema  Edema is when you have too much fluid in your body or under your skin. Edema may make your legs, feet, and ankles swell up. Swelling is also common in looser tissues, like around your eyes. This is a common condition. It gets more common as you get older. There are many possible causes of edema. Eating too much salt (sodium) and being on your feet or sitting for a long time can cause edema in your legs, feet, and ankles. Hot weather may make edema worse. Edema is usually painless. Your skin may look swollen or shiny. Follow these instructions at home:  Keep the swollen body part raised (elevated) above the level of your heart when you are sitting or lying down.  Do not sit still or stand for a long time.  Do not wear tight clothes. Do not wear garters on your upper legs.  Exercise your legs. This can help the swelling go down.  Wear elastic bandages or support stockings as told by your doctor.  Eat a low-salt (low-sodium) diet to reduce fluid as told by your doctor.  Depending on the cause of your swelling, you may need to limit how much fluid you drink (fluid restriction).  Take over-the-counter and prescription medicines only as told by your doctor. Contact a doctor if:  Treatment is not working.  You have heart, liver, or kidney disease and have symptoms of edema.  You have sudden and unexplained weight gain. Get help right away if:  You have shortness of breath or chest pain.  You cannot breathe when you lie down.  You have pain, redness, or warmth in the swollen areas.  You have heart, liver, or kidney disease and get edema all of a sudden.  You have a fever and your symptoms get worse all of a sudden. Summary  Edema is when you have too much fluid in your body or under your skin.  Edema may make your legs, feet, and ankles swell up. Swelling is also common in looser tissues, like around your eyes.  Raise (elevate) the swollen body part above the level of your  heart when you are sitting or lying down.  Follow your doctor's instructions about diet and how much fluid you can drink (fluid restriction). This information is not intended to replace advice given to you by your health care provider. Make sure you discuss any questions you have with your health care provider. Document Revised: 09/22/2017 Document Reviewed: 10/07/2016 Elsevier Patient Education  2020 [employer]  

## 2019-10-15 NOTE — Progress Notes (Signed)
Edward [name], Edward [name] ([id number]) Visit Report for 10/14/2019 HPI Details Patient Name: Date of Service: Edward [name], Edward [name] 10/14/2019 9:15 AM Medical Record [id number] Patient Account Number: [id number] Date of Birth/Sex: Treating RN: 1970/12/03 (49 y.o. Janyth Contes Primary Care Provider: [name] Other Clinician: Referring Provider: Treating Provider/Extender:Armari Fussell, Sammuel Cooper, Douglass Rivers in Treatment: 1 History of Present Illness HPI Description: ADMISSION 10/07/2019 Patient is a 49 year old man with type 2 diabetes. For roughly 1 month he has noted a blister on the top of his right foot. He has had other blisters in this area as well. He is not aware of any footwear trauma or other blistering skin issues. He does not have a history of wounds on his feet or lower legs. He has been applying Bactroban to this area. Past medical history; stage IV chronic renal failure, hypertension, right arm fistula placed on 04/22/2019 but is not started on dialysis, history of congestive heart failure, type 2 diabetes with a hemoglobin A1c of 7.3, Graves' disease. Significant bilateral visual loss ABI in our clinic was 1.15 on the right 1/11; the patient's wound on the top of his right foot is just about fully closed. Still a small open area. We have been using silver alginate that stuck to the wound slightly. He has stage IV chronic renal failure but not on dialysis. Congestive heart failure the wound which started a blister on his foot was no doubt due to excess fluid. Electronic Signature(s) Signed: 10/14/2019 6:04:26 PM By: Linton Ham MD Entered By: Linton Ham on 10/14/2019 12:51:11 -------------------------------------------------------------------------------- Physical Exam Details Patient Name: Date of Service: Edward [name], Edward [name] 10/14/2019 9:15 AM Medical Record [id number] Patient Account Number: [id number] Date of Birth/Sex: Treating  RN: 06/20/1971 (49 y.o. Janyth Contes Primary Care Provider: [name] Other Clinician: Referring Provider: Treating Provider/Extender:Teal Bontrager, Sammuel Cooper, Enobong Weeks in Treatment: 1 Constitutional Patient is hypertensive.. Pulse regular and within target range for patient.Marland Kitchen Respirations regular, non-labored and within target range.. Temperature is normal and within the target range for the patient.Marland Kitchen Appears in no distress. Notes Wound exam; the area in question is on the right dorsal foot. Almost all of this is closed small open area remains that looks a bit friable. We will therefore put him back into compression. Electronic Signature(s) Signed: 10/14/2019 6:04:26 PM By: Linton Ham MD Entered By: Linton Ham on 10/14/2019 12:52:10 -------------------------------------------------------------------------------- Physician Orders Details Patient Name: Date of Service: Edward [name], Edward [name] 10/14/2019 9:15 AM Medical Record Number:[id number] Patient Account Number: [id number] Date of Birth/Sex: Treating RN: 08-11-1971 (49 y.o. Janyth Contes Primary Care Provider: [name] Other Clinician: Referring Provider: Treating Provider/Extender:Jakylan Ron, Sammuel Cooper, Douglass Rivers in Treatment: 1 Verbal / Phone Orders: No Diagnosis Coding ICD-10 Coding Code Description E11.621 Type 2 diabetes mellitus with foot ulcer L97.511 Non-pressure chronic ulcer of other part of right foot limited to breakdown of skin I87.321 Chronic venous hypertension (idiopathic) with inflammation of right lower extremity E11.42 Type 2 diabetes mellitus with diabetic polyneuropathy Follow-up Appointments Return Appointment in 1 week. Dressing Change Frequency Wound #1 Right,Dorsal Foot Do not change entire dressing for one week. Skin Barriers/Peri-Wound Care Moisturizing lotion TCA Cream or Ointment - mixed with lotion Wound Cleansing Wound #1 Right,Dorsal Foot May shower  with protection. - use cast protector Primary Wound Dressing Wound #1 Right,Dorsal Foot Calcium Alginate Secondary Dressing Wound #1 Right,Dorsal Foot Dry Gauze Edema Control 3 Layer Compression System - Right Lower Extremity Avoid standing for long periods of time Elevate legs to the level of the heart  or above for 30 minutes daily and/or when sitting, a frequency of: - throughout the day Exercise regularly Electronic Signature(s) Signed: 10/14/2019 6:04:26 PM By: Linton Ham MD Signed: 10/15/2019 6:18:44 PM By: Levan Hurst RN, BSN Entered By: Levan Hurst on 10/14/2019 10:33:25 -------------------------------------------------------------------------------- Problem List Details Patient Name: Date of Service: Edward [name], Edward [name] 10/14/2019 9:15 AM Medical Record [id number] Patient Account Number: [id number] Date of Birth/Sex: Treating RN: 1971/09/05 (49 y.o. Janyth Contes Primary Care Provider: [name] Other Clinician: Referring Provider: Treating Provider/Extender:Jodiann Ognibene, Sammuel Cooper, Douglass Rivers in Treatment: 1 Active Problems ICD-10 Evaluated Encounter Code Description Active Date Today Diagnosis E11.621 Type 2 diabetes mellitus with foot ulcer 10/07/2019 No Yes L97.511 Non-pressure chronic ulcer of other part of right foot 10/07/2019 No Yes limited to breakdown of skin I87.321 Chronic venous hypertension (idiopathic) with 10/07/2019 No Yes inflammation of right lower extremity E11.42 Type 2 diabetes mellitus with diabetic polyneuropathy 10/07/2019 No Yes Inactive Problems Resolved Problems Electronic Signature(s) Signed: 10/14/2019 6:04:26 PM By: Linton Ham MD Entered By: Linton Ham on 10/14/2019 12:46:51 -------------------------------------------------------------------------------- Progress Note Details Patient Name: Date of Service: Edward [name] 10/14/2019 9:15 AM Medical Record Number:[id number] Patient Account  Number: [id number] Date of Birth/Sex: Treating RN: 10-28-1970 (49 y.o. Janyth Contes Primary Care Provider: [name] Other Clinician: Referring Provider: Treating Provider/Extender:Makoa Satz, Sammuel Cooper, Douglass Rivers in Treatment: 1 Subjective History of Present Illness (HPI) ADMISSION 10/07/2019 Patient is a 49 year old man with type 2 diabetes. For roughly 1 month he has noted a blister on the top of his right foot. He has had other blisters in this area as well. He is not aware of any footwear trauma or other blistering skin issues. He does not have a history of wounds on his feet or lower legs. He has been applying Bactroban to this area. Past medical history; stage IV chronic renal failure, hypertension, right arm fistula placed on 04/22/2019 but is not started on dialysis, history of congestive heart failure, type 2 diabetes with a hemoglobin A1c of 7.3, Graves' disease. Significant bilateral visual loss ABI in our clinic was 1.15 on the right 1/11; the patient's wound on the top of his right foot is just about fully closed. Still a small open area. We have been using silver alginate that stuck to the wound slightly. He has stage IV chronic renal failure but not on dialysis. Congestive heart failure the wound which started a blister on his foot was no doubt due to excess fluid. Objective Constitutional Patient is hypertensive.. Pulse regular and within target range for patient.Marland Kitchen Respirations regular, non-labored and within target range.. Temperature is normal and within the target range for the patient.Marland Kitchen Appears in no distress. Vitals Time Taken: 9:55 AM, Height: 72 in, Weight: 252 lbs, BMI: 34.2, Temperature: 98.4 F, Pulse: 86 bpm, Respiratory Rate: 18 breaths/min, Blood Pressure: 145/76 mmHg. General Notes: Wound exam; the area in question is on the right dorsal foot. Almost all of this is closed small open area remains that looks a bit friable. We will therefore put  him back into compression. Integumentary (Hair, Skin) Wound #1 status is Open. Original cause of wound was Blister. The wound is located on the Right,Dorsal Foot. The wound measures 1.8cm length x 1.8cm width x 0.1cm depth; 2.545cm^2 area and 0.254cm^3 volume. There is Fat Layer (Subcutaneous Tissue) Exposed exposed. There is no tunneling or undermining noted. There is a small amount of serous drainage noted. The wound margin is flat and intact. There is large (67-100%) pink granulation within  the wound bed. There is no necrotic tissue within the wound bed. Assessment Active Problems ICD-10 Type 2 diabetes mellitus with foot ulcer Non-pressure chronic ulcer of other part of right foot limited to breakdown of skin Chronic venous hypertension (idiopathic) with inflammation of right lower extremity Type 2 diabetes mellitus with diabetic polyneuropathy Procedures Wound #1 Pre-procedure diagnosis of Wound #1 is a Diabetic Wound/Ulcer of the Lower Extremity located on the Right,Dorsal Foot . There was a Three Layer Compression Therapy Procedure by Levan Hurst, RN. Post procedure Diagnosis Wound #1: Same as Pre-Procedure Plan Follow-up Appointments: Return Appointment in 1 week. Dressing Change Frequency: Wound #1 Right,Dorsal Foot: Do not change entire dressing for one week. Skin Barriers/Peri-Wound Care: Moisturizing lotion TCA Cream or Ointment - mixed with lotion Wound Cleansing: Wound #1 Right,Dorsal Foot: May shower with protection. - use cast protector Primary Wound Dressing: Wound #1 Right,Dorsal Foot: Calcium Alginate Secondary Dressing: Wound #1 Right,Dorsal Foot: Dry Gauze Edema Control: 3 Layer Compression System - Right Lower Extremity Avoid standing for long periods of time Elevate legs to the level of the heart or above for 30 minutes daily and/or when sitting, a frequency of: - throughout the day Exercise regularly #1 continue with calcium alginate under 3 layer  compression 2. He should be healed by next week. It is very difficult to think of recommending compression for him as he does not seem to have had a prior wound history. 3. His edema control looks better from a systemic fluid volume point of view. No evidence of heart failure. He also has stage IV renal failure Electronic Signature(s) Signed: 10/14/2019 6:04:26 PM By: Linton Ham MD Entered By: Linton Ham on 10/14/2019 12:54:13 -------------------------------------------------------------------------------- SuperBill Details Patient Name: Date of Service: Edward [name] 10/14/2019 Medical Record Number:[id number] Patient Account Number: [id number] Date of Birth/Sex: Treating RN: 10/09/1970 (50 y.o. Janyth Contes Primary Care Provider: [name] Other Clinician: Referring Provider: Treating Provider/Extender:Kermit Arnette, Sammuel Cooper, Douglass Rivers in Treatment: 1 Diagnosis Coding ICD-10 Codes Code Description E11.621 Type 2 diabetes mellitus with foot ulcer L97.511 Non-pressure chronic ulcer of other part of right foot limited to breakdown of skin I87.321 Chronic venous hypertension (idiopathic) with inflammation of right lower extremity E11.42 Type 2 diabetes mellitus with diabetic polyneuropathy Facility Procedures CPT4 Code Description: [id number] (Facility Use Only) [phone] - Iselin LWR RT LEG Modifier: Quantity: 1 Physician Procedures CPT4 Code Description: [id number] - WC PHYS LEVEL 3 - EST PT ICD-10 Diagnosis Description L97.511 Non-pressure chronic ulcer of other part of right foot E11.621 Type 2 diabetes mellitus with foot ulcer E11.42 Type 2 diabetes mellitus with diabetic  polyneuropathy Modifier: limited to breakd Quantity: 1 own of skin Electronic Signature(s) Signed: 10/14/2019 6:04:26 PM By: Linton Ham MD Entered By: Linton Ham on 10/14/2019 12:54:41

## 2019-10-15 NOTE — Progress Notes (Signed)
Established Patient Office Visit  Subjective:  Patient ID: Edward WHYTE Sr., male    DOB: [birth year]  Age: 49 y.o. MRN: [id number]  CC:  Chief Complaint  Patient presents with  . Diabetes    HPI Edward PENLEY Sr. is a 49 year old male with a history of hypertension, type 2 diabetes mellitus (A1c 7.9), Graves' disease (status post radioactive iodine treatment in 07/2017), and stage IV chronic kidney disease who presents today for a follow up visit.    The patient's blood pressure is elevated today at 169/90 but he states that he left home early this morning and has not taken any of his antihypertensives.  He states that he checks his blood pressure occasionally at home and his systolic is normally in the low 130's when he takes his medication.  He states that he is usually compliant with his medications.   His A1c increased from 6.4 in 10/2018 to 7.9 today.  The patient states that his glipizide was "old" so he wasn't taking it over the last few months but he recently refilled it and is going to be resuming this medication.  He does not routinely check his blood sugar.  Last eye exam was two weeks ago.  Diabetic foot exam performed today. He takes levothyroxine as prescribed.  It has been a year since his last visit with endocrinology so I encouraged him to make an appointment. He has had AV fistula placed  in anticipation of dialysis in the future and is receiving iron replacement therapy.  Followed by nephrology monthly.  Today the patient does state that he has 4/10 pain in both knees, back, and ankles.  This pain is chronic and has persisted for more than two years.  He does not take anything to treat the pain.  The bloating, gas, and intermittent diarrhea that the patient complained of at the last visit are still present.  He has avoided dairy with no improvement.  H. Pylori breath test and test for celiac disease negative.  Past Medical History:  Diagnosis Date  . Anemia     . Arthritis   . Blind    Left eye  . CHF (congestive heart failure) ([facility])   . Chronic kidney disease    stage 5  . Depression    situatuional  . Diabetes mellitus    Type II  . Graves disease 2014  . Headache    in past  . Hypertension   . Hypothyroidism   . Legally blind    right eye  . Neuropathy   . Non-compliance   . Shortness of breath dyspnea    "Better since I've been taking my medications"    Past Surgical History:  Procedure Laterality Date  . AV FISTULA PLACEMENT Right 04/22/2019   Procedure: RIGHT ARM ARTERIOVENOUS (AV) FISTULA CREATION;  Surgeon: Angelia Mould, MD;  Location: [facility];  Service: Vascular;  Laterality: Right;  . CIRCUMCISION     as a child, around 29 years of age  . EYE SURGERY Bilateral    "several "  . PARS PLANA VITRECTOMY Right 03/25/2016   Procedure: PARS PLANA VITRECTOMY 25 GAUGE FOR ENDOPHTHALMITIS;  Surgeon: Jalene Mullet, MD;  Location: [facility];  Service: Ophthalmology;  Laterality: Right;  . WISDOM TOOTH EXTRACTION      Family History  Problem Relation Age of Onset  . Hypertension Mother   . Diabetes Mother     Social History   Socioeconomic History  . Marital status: Married  Spouse name: Not on file  . Number of children: Not on file  . Years of education: Not on file  . Highest education level: Not on file  Occupational History  . Not on file  Tobacco Use  . Smoking status: Former Smoker    Packs/day: 0.75    Years: 3.00    Pack years: 2.25    Types: Cigarettes    Quit date: 05/28/2000    Years since quitting: 19.3  . Smokeless tobacco: Never Used  Substance and Sexual Activity  . Alcohol use: Yes    Alcohol/week: 0.0 standard drinks    Comment: 1 beer  ocassional  . Drug use: No  . Sexual activity: Not on file  Other Topics Concern  . Not on file  Social History Narrative  . Not on file   Social Determinants of Health   Financial Resource Strain:   . Difficulty of Paying Living Expenses: Not on  file  Food Insecurity:   . Worried About Charity fundraiser in the Last Year: Not on file  . Ran Out of Food in the Last Year: Not on file  Transportation Needs:   . Lack of Transportation (Medical): Not on file  . Lack of Transportation (Non-Medical): Not on file  Physical Activity:   . Days of Exercise per Week: Not on file  . Minutes of Exercise per Session: Not on file  Stress:   . Feeling of Stress : Not on file  Social Connections:   . Frequency of Communication with Friends and Family: Not on file  . Frequency of Social Gatherings with Friends and Family: Not on file  . Attends Religious Services: Not on file  . Active Member of Clubs or Organizations: Not on file  . Attends Archivist Meetings: Not on file  . Marital Status: Not on file  Intimate Partner Violence:   . Fear of Current or Ex-Partner: Not on file  . Emotionally Abused: Not on file  . Physically Abused: Not on file  . Sexually Abused: Not on file    Outpatient Medications Prior to Visit  Medication Sig Dispense Refill  . acetaminophen (TYLENOL) 500 MG tablet Take 500 mg by mouth every 6 (six) hours as needed.    Marland Kitchen atorvastatin (LIPITOR) 20 MG tablet Take 1 tablet (20 mg total) by mouth daily. 30 tablet 6  . calcitRIOL (ROCALTROL) 0.5 MCG capsule Take 0.5 mcg by mouth daily.    . carvedilol (COREG) 3.125 MG tablet Take 1 tablet (3.125 mg total) by mouth 2 (two) times daily. 90 tablet 0  . diclofenac sodium (VOLTAREN) 1 % GEL Apply 2 g topically 3 (three) times daily as needed (arthritic pain).    . hydrALAZINE (APRESOLINE) 50 MG tablet Take 1 tablet (50 mg total) by mouth 2 (two) times daily. 60 tablet 3  . levothyroxine (SYNTHROID) 112 MCG tablet TAKE 1 TABLET (112 MCG TOTAL) BY MOUTH DAILY. 30 tablet 0  . sildenafil (VIAGRA) 50 MG tablet Take 2 tablets (100 mg total) by mouth daily as needed for erectile dysfunction. At least 24 hours between doses. 10 tablet 1  . torsemide (DEMADEX) 100 MG tablet  Take 100 mg by mouth daily.  6  . glipiZIDE (GLUCOTROL) 5 MG tablet Take 0.5 tablets (2.5 mg total) by mouth daily before breakfast. (Patient taking differently: Take 2.5 mg by mouth daily. Hold for low blood glucose) 15 tablet 6  . oxyCODONE (ROXICODONE) 5 MG immediate release tablet Take 1 tablet (  5 mg total) by mouth every 4 (four) hours as needed. (Patient not taking: Reported on 05/01/2019) 15 tablet 0   No facility-administered medications prior to visit.    No Known Allergies  ROS Review of Systems  Constitutional: Negative for fatigue, fever and unexpected weight change.  HENT: Negative for congestion, rhinorrhea, sinus pressure and sinus pain.   Eyes: Negative for visual disturbance.  Respiratory: Positive for shortness of breath and wheezing. Negative for cough and chest tightness.   Cardiovascular: Positive for leg swelling. Negative for chest pain and palpitations.  Gastrointestinal: Positive for abdominal distention and diarrhea. Negative for abdominal pain, constipation, nausea and vomiting.  Endocrine: Negative for polydipsia and polyuria.  Genitourinary: Negative for difficulty urinating and dysuria.  Musculoskeletal: Positive for arthralgias and back pain. Negative for myalgias.       Bilateral knees, lower back, bilateral ankles  Skin: Negative for color change and rash.  Neurological: Negative for dizziness, tremors, weakness and numbness.  Hematological: Does not bruise/bleed easily.  Psychiatric/Behavioral: Negative for agitation and behavioral problems.      Objective:    Physical Exam  Constitutional: He is oriented to person, place, and time. He appears well-developed and well-nourished. No distress.  HENT:  Head: Normocephalic and atraumatic.  Eyes:  Cataract present in left eye  Cardiovascular: Normal rate, regular rhythm, normal heart sounds and intact distal pulses.  No murmur heard. Right arm AV fistula - bruit and thrill present +2 bilateral pedal  edema  Pulmonary/Chest: Effort normal. No respiratory distress. He has wheezes. He has rales.  Abdominal: Bowel sounds are normal. He exhibits distension. There is no abdominal tenderness.  Musculoskeletal:        General: Edema present. Normal range of motion.     Cervical back: Normal range of motion and neck supple.  Neurological: He is alert and oriented to person, place, and time.  Skin: Skin is warm and dry. No rash noted. No erythema.  Right foot wound that has been debrided.  Dressing in place from just below the knee to the toes. Clean, dry, intact.  Psychiatric: He has a normal mood and affect. His behavior is normal.    BP (!) 169/90   Pulse 92   Ht 6' (1.829 m)   Wt 264 lb (119.7 kg)   SpO2 97%   BMI 35.80 kg/m    Health Maintenance Due  Topic Date Due  . FOOT EXAM  12/02/2018    There are no preventive care reminders to display for this patient.  Lab Results  Component Value Date   TSH >49.10 (H) 10/22/2018   Lab Results  Component Value Date   WBC 8.9 03/21/2018   HGB 8.7 (L) 10/03/2019   HCT 26.0 (L) 04/22/2019   MCV 94.1 03/21/2018   PLT 220 03/21/2018   Lab Results  Component Value Date   NA 142 08/13/2019   K 4.5 08/13/2019   CO2 20 08/13/2019   GLUCOSE 168 (H) 08/13/2019   BUN 60 (H) 08/13/2019   CREATININE 6.77 (H) 08/13/2019   BILITOT 0.3 08/13/2019   ALKPHOS 100 08/13/2019   AST 33 08/13/2019   ALT 33 08/13/2019   PROT 7.2 08/13/2019   ALBUMIN 4.0 08/13/2019   CALCIUM 9.1 08/13/2019   ANIONGAP 9 03/21/2018   Lab Results  Component Value Date   CHOL 242 (H) 06/11/2018   Lab Results  Component Value Date   HDL 84 06/11/2018   Lab Results  Component Value Date   LDLCALC 137 (  H) 06/11/2018   Lab Results  Component Value Date   TRIG 107 06/11/2018   Lab Results  Component Value Date   CHOLHDL 2.9 06/11/2018   Lab Results  Component Value Date   HGBA1C 7.9 (A) 10/15/2019      Assessment & Plan:   1. Non compliance w  medication regimen This is an ongoing issue.  We have again discussed the complications of his medical conditions especially in the light of his noncompliance and he promises to do better as he has always said but still remains noncompliant.  2. Type 2 diabetes mellitus with stage 4 chronic kidney disease, without long-term current use of insulin (HCC) Uncontrolled with an A1c of 7.9 Resume glipizide  Encouraged medication compliance Management of CKD as per nephrology, continue iron replacement therapy Counseled on Diabetic diet, my plate method, 094 minutes of moderate intensity exercise/week Last eye exam two weeks ago. Foot exam performed today. - Glucose (CBG) - HgB A1c - Lipid panel; Future - T4, free; Future - TSH; Future - glipiZIDE (GLUCOTROL) 5 MG tablet; Take 0.5 tablets (2.5 mg total) by mouth daily. Hold for low blood glucose  Dispense: 15 tablet; Refill: 6  3. Pedal edema Seen by nephrology today who increased torsemide to 200 mg daily Needs to schedule a follow up with cardiology - Brain natriuretic peptide; Future  4. Pain in other joint Chronic Advised to avoid NSAID's due to kidney disease Recommended acetaminophen as needed for pain  5. Graves disease Compliant with levothyroxine Needs to schedule an appointment with his endocrinologist Check TSH  6. Abdominal bloating Persists despite dietary changes Negative for H. Pylori and celiac disease - Ambulatory referral to Gastroenterology   Meds ordered this encounter  Medications  . glipiZIDE (GLUCOTROL) 5 MG tablet    Sig: Take 0.5 tablets (2.5 mg total) by mouth daily. Hold for low blood glucose    Dispense:  15 tablet    Refill:  6    Follow-up: No follow-ups on file.    Tomasita Morrow, RN

## 2019-10-16 ENCOUNTER — Inpatient Hospital Stay (HOSPITAL_COMMUNITY): Admission: RE | Admit: 2019-10-16 | Payer: Medicare HMO | Source: Ambulatory Visit

## 2019-10-17 ENCOUNTER — Encounter: Payer: Self-pay | Admitting: Physician Assistant

## 2019-10-17 ENCOUNTER — Encounter (HOSPITAL_COMMUNITY): Payer: Medicare HMO

## 2019-10-17 DIAGNOSIS — E113511 Type 2 diabetes mellitus with proliferative diabetic retinopathy with macular edema, right eye: Secondary | ICD-10-CM | POA: Diagnosis not present

## 2019-10-18 ENCOUNTER — Other Ambulatory Visit: Payer: Medicare HMO

## 2019-10-18 NOTE — Progress Notes (Signed)
CARLISLE, ENKE ([id number]) Visit Report for 10/14/2019 Arrival Information Details Patient Name: Date of Service: TEREL, BANN 10/14/2019 9:15 AM Medical Record [id number] Patient Account Number: [id number] Date of Birth/Sex: Treating RN: 02-Feb-1971 (49 y.o. Marvis Repress Primary Care Bellamarie Pflug: [name] Other Clinician: Referring Jaxiel Kines: Treating Lem Peary/Extender:Robson, Sammuel Cooper, Douglass Rivers in Treatment: 1 Visit Information History Since Last Visit Added or deleted any medications: No Patient Arrived: Ambulatory Any new allergies or adverse reactions: No Arrival Time: 09:52 Had a fall or experienced change in No Accompanied By: self activities of daily living that may affect Transfer Assistance: None risk of falls: Patient Identification Verified: Yes Signs or symptoms of abuse/neglect since No Secondary Verification Process Yes last visito Completed: Hospitalized since last visit: No Patient Requires Transmission-Based No Implantable device outside of the clinic No Precautions: excluding Patient Has Alerts: No cellular tissue based products placed in the center since last visit: Has Dressing in Place as Prescribed: Yes Has Compression in Place as Prescribed: Yes Has Footwear/Offloading in Place as Yes Prescribed: Right: Wedge Shoe Pain Present Now: No Electronic Signature(s) Signed: 10/14/2019 5:57:16 PM By: Kela Millin Entered By: Kela Millin on 10/14/2019 09:53:18 -------------------------------------------------------------------------------- Compression Therapy Details Patient Name: Date of Service: KELLEN, DUTCH 10/14/2019 9:15 AM Medical Record Number:[id number] Patient Account Number: [id number] Date of Birth/Sex: Treating RN: [birth year] (49 y.o. Janyth Contes Primary Care Galaxy Borden: [name] Other Clinician: Referring Mamoru Takeshita: Treating Dauna Ziska/Extender:Robson,  Sammuel Cooper, Enobong Weeks in Treatment: 1 Compression Therapy Performed for Wound Wound #1 Right,Dorsal Foot Assessment: Performed By: Clinician Levan Hurst, RN Compression Type: Three Layer Post Procedure Diagnosis Same as Pre-procedure Electronic Signature(s) Signed: 10/15/2019 6:18:44 PM By: Levan Hurst RN, BSN Entered By: Levan Hurst on 10/14/2019 10:32:43 -------------------------------------------------------------------------------- Encounter Discharge Information Details Patient Name: Date of Service: LAURIN, MORGENSTERN 10/14/2019 9:15 AM Medical Record [id number] Patient Account Number: [id number] Date of Birth/Sex: Treating RN: 03/19/1971 (49 y.o. Marvis Repress Primary Care Jaymian Bogart: [name] Other Clinician: Referring Edilia Ghuman: Treating Furman Trentman/Extender:Robson, Sammuel Cooper, Douglass Rivers in Treatment: 1 Encounter Discharge Information Items Discharge Condition: Stable Ambulatory Status: Ambulatory Discharge Destination: Home Transportation: Other Accompanied By: self Schedule Follow-up Appointment: Yes Clinical Summary of Care: Patient Declined Electronic Signature(s) Signed: 10/14/2019 5:57:16 PM By: Kela Millin Entered By: Kela Millin on 10/14/2019 10:50:44 -------------------------------------------------------------------------------- Lower Extremity Assessment Details Patient Name: Date of Service: ELIZARDO, CHILSON 10/14/2019 9:15 AM Medical Record Number:[id number] Patient Account Number: [id number] Date of Birth/Sex: Treating RN: 1971-06-19 (49 y.o. Marvis Repress Primary Care Wylma Tatem: [name] Other Clinician: Referring Alessa Mazur: Treating Richards Pherigo/Extender:Robson, Sammuel Cooper, Charlane Ferretti Weeks in Treatment: 1 Edema Assessment Assessed: [Left: No] [Right: No] Edema: [Left: Ye] [Right: s] Calf Left: Right: Point of Measurement: 41 cm From Medial Instep cm 44 cm Ankle Left:  Right: Point of Measurement: 12 cm From Medial Instep cm 29 cm Vascular Assessment Pulses: Dorsalis Pedis Palpable: [Right:Yes] Electronic Signature(s) Signed: 10/14/2019 5:57:16 PM By: Kela Millin Entered By: Kela Millin on 10/14/2019 09:59:37 -------------------------------------------------------------------------------- Multi Wound Chart Details Patient Name: Date of Service: [name] 10/14/2019 9:15 AM Medical Record Number:[id number] Patient Account Number: [id number] Date of Birth/Sex: Treating RN: 1971/04/06 (49 y.o. Janyth Contes Primary Care Clara Smolen: [name] Other Clinician: Referring Xaden Kaufman: Treating Mindy Gali/Extender:Robson, Sammuel Cooper, Enobong Weeks in Treatment: 1 Vital Signs Height(in): 72 Pulse(bpm): 86 Weight(lbs): 252 Blood Pressure(mmHg): 145/76 Body Mass Index(BMI): 34 Temperature(F): 98.4 Respiratory 18 Rate(breaths/min): Photos: [1:No Photos] [N/A:N/A] Wound Location: [1:Right Foot - Dorsal] [N/A:N/A] Wounding Event: [1:Blister] [N/A:N/A] Primary Etiology: [1:Diabetic  Wound/Ulcer of the N/A Lower Extremity] Comorbid History: [1:Cataracts, Glaucoma, Anemia, Congestive Heart Failure, Hypertension, Type II Diabetes, End Stage Renal Disease, Gout, Neuropathy] [N/A:N/A] Date Acquired: [1:09/03/2019] [N/A:N/A] Weeks of Treatment: [1:1] [N/A:N/A] Wound Status: [1:Open] [N/A:N/A] Measurements L x W x D [1:1.8x1.8x0.1] [N/A:N/A] (cm) Area (cm) : [1:2.545] [N/A:N/A] Volume (cm) : [1:0.254] [N/A:N/A] % Reduction in Area: [1:43.30%] [N/A:N/A] % Reduction in Volume: [1:43.40%] [N/A:N/A] Classification: [1:Grade 2] [N/A:N/A] Exudate Amount: [1:Small] [N/A:N/A] Exudate Type: [1:Serous] [N/A:N/A] Exudate Color: [1:amber] [N/A:N/A] Wound Margin: [1:Flat and Intact] [N/A:N/A] Granulation Amount: [1:Large (67-100%)] [N/A:N/A] Granulation Quality: [1:Pink] [N/A:N/A] Necrotic Amount: [1:None Present (0%)]  [N/A:N/A] Exposed Structures: [1:Fat Layer (Subcutaneous Tissue) Exposed: Yes Fascia: No Tendon: No Muscle: No Joint: No Bone: No] [N/A:N/A] Epithelialization: [1:None Compression Therapy] [N/A:N/A N/A] Treatment Notes Wound #1 (Right, Dorsal Foot) 1. Cleanse With Wound Cleanser Soap and water 2. Periwound Care Moisturizing lotion TCA Cream 3. Primary Dressing Applied Calcium Alginate 4. Secondary Dressing Dry Gauze 6. Support Layer Applied 3 layer compression wrap Notes netting. Electronic Signature(s) Signed: 10/14/2019 6:04:26 PM By: Linton Ham MD Signed: 10/15/2019 6:18:44 PM By: Levan Hurst RN, BSN Entered By: Linton Ham on 10/14/2019 12:47:03 -------------------------------------------------------------------------------- Multi-Disciplinary Care Plan Details Patient Name: Date of Service: AVID, GUILLETTE 10/14/2019 9:15 AM Medical Record Number:[id number] Patient Account Number: [id number] Date of Birth/Sex: Treating RN: Jul 26, 1971 (49 y.o. Janyth Contes Primary Care Yon Schiffman: [name] Other Clinician: Referring Amarie Tarte: Treating Elvyn Krohn/Extender:Robson, Sammuel Cooper, Douglass Rivers in Treatment: 1 Active Inactive Nutrition Nursing Diagnoses: Impaired glucose control: actual or potential Potential for alteratiion in Nutrition/Potential for imbalanced nutrition Goals: Patient/caregiver agrees to and verbalizes understanding of need to use nutritional supplements and/or vitamins as prescribed Date Initiated: 10/07/2019 Target Resolution Date: 11/08/2019 Goal Status: Active Patient/caregiver will maintain therapeutic glucose control Date Initiated: 10/07/2019 Target Resolution Date: 11/08/2019 Goal Status: Active Interventions: Assess HgA1c results as ordered upon admission and as needed Assess patient nutrition upon admission and as needed per policy Provide education on elevated blood sugars and impact on wound healing Provide  education on nutrition Treatment Activities: Education provided on Nutrition : 10/07/2019 Notes: Wound/Skin Impairment Nursing Diagnoses: Impaired tissue integrity Knowledge deficit related to ulceration/compromised skin integrity Goals: Patient/caregiver will verbalize understanding of skin care regimen Date Initiated: 10/07/2019 Target Resolution Date: 11/08/2019 Goal Status: Active Ulcer/skin breakdown will have a volume reduction of 30% by week 4 Date Initiated: 10/07/2019 Target Resolution Date: 11/08/2019 Goal Status: Active Interventions: Assess patient/caregiver ability to obtain necessary supplies Assess patient/caregiver ability to perform ulcer/skin care regimen upon admission and as needed Assess ulceration(s) every visit Provide education on ulcer and skin care Notes: Electronic Signature(s) Signed: 10/15/2019 6:18:44 PM By: Levan Hurst RN, BSN Entered By: Levan Hurst on 10/14/2019 10:32:07 -------------------------------------------------------------------------------- Pain Assessment Details Patient Name: Date of Service: [name] 10/14/2019 9:15 AM Medical Record Number:[id number] Patient Account Number: [id number] Date of Birth/Sex: Treating RN: 1971-01-23 (49 y.o. Marvis Repress Primary Care Demeco Ducksworth: [name] Other Clinician: Referring Rashell Shambaugh: Treating Clorinda Wyble/Extender:Robson, Sammuel Cooper, Charlane Ferretti Weeks in Treatment: 1 Active Problems Location of Pain Severity and Description of Pain Patient Has Paino No Site Locations Pain Management and Medication Current Pain Management: Electronic Signature(s) Signed: 10/14/2019 5:57:16 PM By: Kela Millin Entered By: Kela Millin on 10/14/2019 09:59:15 -------------------------------------------------------------------------------- Patient/Caregiver Education Details Codispoti, Richardson 1/11/2021andnbsp9:15 Patient Name: Date of Service: L. AM Medical Record Patient  Account Number: [id number] Number: [id number] Number: Treating RN: Levan Hurst Date of Birth/Gender: 1971/08/07 (49 y.o. M) Other Clinician: Primary Care Physician:Newlin, Enobong Treating  Linton Ham Referring Physician: Physician/Extender: Evette Georges in Treatment: 1 Education Assessment Education Provided To: Patient Education Topics Provided Wound/Skin Impairment: Methods: Explain/Verbal Responses: State content correctly Electronic Signature(s) Signed: 10/15/2019 6:18:44 PM By: Levan Hurst RN, BSN Entered By: Levan Hurst on 10/14/2019 10:32:20 -------------------------------------------------------------------------------- Wound Assessment Details Patient Name: Date of Service: [name] 10/14/2019 9:15 AM Medical Record Number:[id number] Patient Account Number: [id number] Date of Birth/Sex: Treating RN: 10-May-1971 (49 y.o. Marvis Repress Primary Care Jazari Ober: [name] Other Clinician: Referring Leopoldo Mazzie: Treating Kainan Patty/Extender:Robson, Sammuel Cooper, Enobong Weeks in Treatment: 1 Wound Status Wound Number: 1 Primary Diabetic Wound/Ulcer of the Lower Extremity Etiology: Wound Location: Right Foot - Dorsal Wound Open Wounding Event: Blister Status: Date Acquired: 09/03/2019 Comorbid Cataracts, Glaucoma, Anemia, Congestive Weeks Of Treatment: 1 History: Heart Failure, Hypertension, Type II Diabetes, Clustered Wound: No End Stage Renal Disease, Gout, Neuropathy Photos Wound Measurements Length: (cm) 1.8 Width: (cm) 1.8 Depth: (cm) 0.1 Area: (cm) 2.545 Volume: (cm) 0.254 Wound Description Classification: Grade 2 Wound Margin: Flat and Intact Exudate Amount: Small Exudate Type: Serous Exudate Color: amber Wound Bed Granulation Amount: Large (67-100%) Granulation Quality: Pink Necrotic Amount: None Present (0%) After Cleansing: No brino No Exposed Structure posed: No (Subcutaneous Tissue)  Exposed: Yes posed: No posed: No osed: No sed: No % Reduction in Area: 43.3% % Reduction in Volume: 43.4% Epithelialization: None Tunneling: No Undermining: No Foul Odor Slough/Fi Fascia Ex Fat Layer Tendon Ex Muscle Ex Joint Exp Bone Expo Treatment Notes Wound #1 (Right, Dorsal Foot) 1. Cleanse With Wound Cleanser Soap and water 2. Periwound Care Moisturizing lotion TCA Cream 3. Primary Dressing Applied Calcium Alginate 4. Secondary Dressing Dry Gauze 6. Support Layer Applied 3 layer compression wrap Notes netting. Electronic Signature(s) Signed: 10/15/2019 3:22:35 PM By: Mikeal Hawthorne EMT/HBOT Signed: 10/18/2019 5:57:08 PM By: Kela Millin Previous Signature: 10/14/2019 5:57:16 PM Version By: Kela Millin Entered By: Mikeal Hawthorne on 10/15/2019 14:24:47 -------------------------------------------------------------------------------- Vitals Details Patient Name: Date of Service: [name] 10/14/2019 9:15 AM Medical Record Number:[id number] Patient Account Number: [id number] Date of Birth/Sex: Treating RN: 01-04-1971 (49 y.o. Marvis Repress Primary Care Mersades Barbaro: [name] Other Clinician: Referring Martika Egler: Treating Mariabella Nilsen/Extender:Robson, Sammuel Cooper, Enobong Weeks in Treatment: 1 Vital Signs Time Taken: 09:55 Temperature (F): 98.4 Height (in): 72 Pulse (bpm): 86 Weight (lbs): 252 Respiratory Rate (breaths/min): 18 Body Mass Index (BMI): 34.2 Blood Pressure (mmHg): 145/76 Reference Range: 80 - 120 mg / dl Electronic Signature(s) Signed: 10/14/2019 5:57:16 PM By: Kela Millin Entered By: Kela Millin on 10/14/2019 09:59:09

## 2019-10-21 ENCOUNTER — Encounter (HOSPITAL_BASED_OUTPATIENT_CLINIC_OR_DEPARTMENT_OTHER): Payer: Medicare HMO | Admitting: Internal Medicine

## 2019-10-21 ENCOUNTER — Ambulatory Visit: Payer: Medicare HMO | Attending: Family Medicine

## 2019-10-21 ENCOUNTER — Other Ambulatory Visit: Payer: Self-pay

## 2019-10-21 DIAGNOSIS — L97511 Non-pressure chronic ulcer of other part of right foot limited to breakdown of skin: Secondary | ICD-10-CM | POA: Diagnosis not present

## 2019-10-21 DIAGNOSIS — I872 Venous insufficiency (chronic) (peripheral): Secondary | ICD-10-CM | POA: Diagnosis not present

## 2019-10-21 DIAGNOSIS — R600 Localized edema: Secondary | ICD-10-CM | POA: Diagnosis not present

## 2019-10-21 DIAGNOSIS — E1142 Type 2 diabetes mellitus with diabetic polyneuropathy: Secondary | ICD-10-CM | POA: Diagnosis not present

## 2019-10-21 DIAGNOSIS — I132 Hypertensive heart and chronic kidney disease with heart failure and with stage 5 chronic kidney disease, or end stage renal disease: Secondary | ICD-10-CM | POA: Diagnosis not present

## 2019-10-21 DIAGNOSIS — S90821A Blister (nonthermal), right foot, initial encounter: Secondary | ICD-10-CM | POA: Diagnosis not present

## 2019-10-21 DIAGNOSIS — E1122 Type 2 diabetes mellitus with diabetic chronic kidney disease: Secondary | ICD-10-CM

## 2019-10-21 DIAGNOSIS — N184 Chronic kidney disease, stage 4 (severe): Secondary | ICD-10-CM

## 2019-10-21 DIAGNOSIS — E11621 Type 2 diabetes mellitus with foot ulcer: Secondary | ICD-10-CM | POA: Diagnosis not present

## 2019-10-21 DIAGNOSIS — Z992 Dependence on renal dialysis: Secondary | ICD-10-CM | POA: Diagnosis not present

## 2019-10-21 DIAGNOSIS — I509 Heart failure, unspecified: Secondary | ICD-10-CM | POA: Diagnosis not present

## 2019-10-21 DIAGNOSIS — N186 End stage renal disease: Secondary | ICD-10-CM | POA: Diagnosis not present

## 2019-10-21 MED FILL — CALCITRIOL 0.5 MCG CAPS: 0.5 | 30 days supply | Qty: 120 | Fill #0

## 2019-10-22 ENCOUNTER — Ambulatory Visit (HOSPITAL_COMMUNITY)
Admission: RE | Admit: 2019-10-22 | Discharge: 2019-10-22 | Disposition: A | Discharge status: Home / Self Care | Payer: Medicare HMO | Source: Ambulatory Visit | Attending: Nephrology | Admitting: Nephrology

## 2019-10-22 ENCOUNTER — Other Ambulatory Visit: Payer: Self-pay | Admitting: Family Medicine

## 2019-10-22 VITALS — BP 161/94 | HR 77 | Temp 97.5°F | Resp 18 | Ht 72.0 in | Wt 256.0 lb

## 2019-10-22 DIAGNOSIS — E7800 Pure hypercholesterolemia, unspecified: Secondary | ICD-10-CM

## 2019-10-22 DIAGNOSIS — N189 Chronic kidney disease, unspecified: Secondary | ICD-10-CM | POA: Diagnosis not present

## 2019-10-22 DIAGNOSIS — N184 Chronic kidney disease, stage 4 (severe): Secondary | ICD-10-CM

## 2019-10-22 DIAGNOSIS — N179 Acute kidney failure, unspecified: Secondary | ICD-10-CM | POA: Diagnosis not present

## 2019-10-22 LAB — LIPID PANEL
Chol/HDL Ratio: 2.5 ratio (ref 0.0–5.0)
Cholesterol, Total: 137 mg/dL (ref 100–199)
HDL: 55 mg/dL (ref >39)
LDL Chol Calc (NIH): 61 mg/dL (ref 0–99)
Triglycerides: 116 mg/dL (ref 0–149)
VLDL Cholesterol Cal: 21 mg/dL (ref 5–40)

## 2019-10-22 LAB — T4, FREE: Free T4: 0.41 ng/dL — ABNORMAL LOW (ref 0.82–1.77)

## 2019-10-22 LAB — TSH: TSH: 79.4 u[IU]/mL — ABNORMAL HIGH (ref 0.450–4.500)

## 2019-10-22 LAB — POCT HEMOGLOBIN-HEMACUE: Hemoglobin: 8.9 g/dL — ABNORMAL LOW (ref 13.0–17.0)

## 2019-10-22 MED ORDER — SODIUM CHLORIDE 0.9 % IV SOLN
510.0000 mg | INTRAVENOUS | Status: DC
  Administered 2019-10-22: 510 mg via INTRAVENOUS
  Filled 2019-10-22: qty 17

## 2019-10-22 MED ORDER — ATORVASTATIN CALCIUM 40 MG PO TABS: 40.0000 mg | ORAL_TABLET | Freq: Every day | ORAL | 6 refills | Status: DC

## 2019-10-22 MED ORDER — EPOETIN ALFA-EPBX 2000 UNIT/ML IJ SOLN
INTRAMUSCULAR | Status: AC
  Administered 2019-10-22: 2000 [IU] via SUBCUTANEOUS
  Filled 2019-10-22: qty 1

## 2019-10-22 MED ORDER — EPOETIN ALFA-EPBX 10000 UNIT/ML IJ SOLN: 15000.0000 [IU] | INTRAMUSCULAR | Status: DC

## 2019-10-22 MED ORDER — EPOETIN ALFA-EPBX 10000 UNIT/ML IJ SOLN
INTRAMUSCULAR | Status: AC
  Administered 2019-10-22: 10000 [IU] via SUBCUTANEOUS
  Filled 2019-10-22: qty 1

## 2019-10-22 MED ORDER — EPOETIN ALFA-EPBX 3000 UNIT/ML IJ SOLN
INTRAMUSCULAR | Status: AC
  Administered 2019-10-22: 3000 [IU] via SUBCUTANEOUS
  Filled 2019-10-22: qty 1

## 2019-10-22 NOTE — Progress Notes (Signed)
Edward [name], Edward [name] ([id number]) Visit Report for 10/21/2019 HPI Details Patient Name: Date of Service: Edward [name], Edward [name] 10/21/2019 9:45 AM Medical Record [id number] Patient Account Number: [id number] Date of Birth/Sex: Treating RN: [birth year] (49 y.o. M) Primary Care Provider: [name] Other Clinician: Referring Provider: Treating Provider/Extender:Candy Leverett, Sammuel Cooper, Douglass Rivers in Treatment: 2 History of Present Illness HPI Description: ADMISSION 10/07/2019 Patient is a 49 year old man with type 2 diabetes. For roughly 1 month he has noted a blister on the top of his right foot. He has had other blisters in this area as well. He is not aware of any footwear trauma or other blistering skin issues. He does not have a history of wounds on his feet or lower legs. He has been applying Bactroban to this area. Past medical history; stage IV chronic renal failure, hypertension, right arm fistula placed on 04/22/2019 but is not started on dialysis, history of congestive heart failure, type 2 diabetes with a hemoglobin A1c of 7.3, Graves' disease. Significant bilateral visual loss ABI in our clinic was 1.15 on the right 1/11; the patient's wound on the top of his right foot is just about fully closed. Still a small open area. We have been using silver alginate that stuck to the wound slightly. He has stage IV chronic renal failure but not on dialysis. Congestive heart failure the wound which started a blister on his foot was no doubt due to excess fluid. 1/18; patient's wound is closed. His edema control seems to be better. He has several healed scars on his anterior shin area but states this was from trauma from sports. Electronic Signature(s) Signed: 10/22/2019 4:08:35 PM By: Linton Ham MD Entered By: Linton Ham on 10/21/2019 11:18:40 -------------------------------------------------------------------------------- Physical Exam Details Patient Name: Date  of Service: Edward [name] 10/21/2019 9:45 AM Medical Record [id number] Patient Account Number: [id number] Date of Birth/Sex: Treating RN: May 26, 1971 (49 y.o. M) Primary Care Provider: [name] Other Clinician: Referring Provider: Treating Provider/Extender:Romesha Scherer, Sammuel Cooper, Charlane Ferretti Weeks in Treatment: 2 Constitutional Patient is hypertensive.. Pulse regular and within target range for patient.Marland Kitchen Respirations regular, non-labored and within target range.. Temperature is normal and within the target range for the patient.Marland Kitchen Appears in no distress. Notes Wound exam; the area in question is on the right dorsal foot. This is completely epithelialized. No major edema is seen Electronic Signature(s) Signed: 10/22/2019 4:08:35 PM By: Linton Ham MD Entered By: Linton Ham on 10/21/2019 11:19:57 -------------------------------------------------------------------------------- Physician Orders Details Patient Name: Date of Service: [name], [name] 10/21/2019 9:45 AM Medical Record Number:[id number] Patient Account Number: [id number] Date of Birth/Sex: Treating RN: May 26, 1971 (49 y.o. Janyth Contes Primary Care Provider: [name] Other Clinician: Referring Provider: Treating Provider/Extender:Merriam Brandner, Sammuel Cooper, Douglass Rivers in Treatment: 2 Verbal / Phone Orders: No Diagnosis Coding ICD-10 Coding Code Description E11.621 Type 2 diabetes mellitus with foot ulcer L97.511 Non-pressure chronic ulcer of other part of right foot limited to breakdown of skin I87.321 Chronic venous hypertension (idiopathic) with inflammation of right lower extremity E11.42 Type 2 diabetes mellitus with diabetic polyneuropathy Discharge From [facility] Services Discharge from [facility] Edema Control Avoid standing for long periods of time Elevate legs to the level of the heart or above for 30 minutes daily and/or when sitting, a frequency of: - throughout  the day Exercise regularly Electronic Signature(s) Signed: 10/21/2019 6:02:32 PM By: Levan Hurst RN, BSN Signed: 10/22/2019 4:08:35 PM By: Linton Ham MD Entered By: Levan Hurst on 10/21/2019 10:33:22 -------------------------------------------------------------------------------- Problem List Details Patient Name: Date of Service:  Edward [name], Edward L. 10/21/2019 9:45 AM Medical Record Number:[id number] Patient Account Number: [id number] Date of Birth/Sex: Treating RN: 1971-03-08 (49 y.o. Janyth Contes Primary Care Provider: [name] Other Clinician: Referring Provider: Treating Provider/Extender:Satrina Magallanes, Sammuel Cooper, Douglass Rivers in Treatment: 2 Active Problems ICD-10 Evaluated Encounter Code Description Active Date Today Diagnosis E11.621 Type 2 diabetes mellitus with foot ulcer 10/07/2019 No Yes L97.511 Non-pressure chronic ulcer of other part of right foot 10/07/2019 No Yes limited to breakdown of skin I87.321 Chronic venous hypertension (idiopathic) with 10/07/2019 No Yes inflammation of right lower extremity E11.42 Type 2 diabetes mellitus with diabetic polyneuropathy 10/07/2019 No Yes Inactive Problems Resolved Problems Electronic Signature(s) Signed: 10/22/2019 4:08:35 PM By: Linton Ham MD Entered By: Linton Ham on 10/21/2019 11:17:19 -------------------------------------------------------------------------------- Progress Note Details Patient Name: Date of Service: Edward [name] 10/21/2019 9:45 AM Medical Record Number:[id number] Patient Account Number: [id number] Date of Birth/Sex: Treating RN: [birth year] (49 y.o. M) Primary Care Provider: [name] Other Clinician: Referring Provider: Treating Provider/Extender:Dani Wallner, Sammuel Cooper, Charlane Ferretti Weeks in Treatment: 2 Subjective History of Present Illness (HPI) ADMISSION 10/07/2019 Patient is a 48 year old man with type 2 diabetes. For roughly 1 month he has noted a blister on  the top of his right foot. He has had other blisters in this area as well. He is not aware of any footwear trauma or other blistering skin issues. He does not have a history of wounds on his feet or lower legs. He has been applying Bactroban to this area. Past medical history; stage IV chronic renal failure, hypertension, right arm fistula placed on 04/22/2019 but is not started on dialysis, history of congestive heart failure, type 2 diabetes with a hemoglobin A1c of 7.3, Graves' disease. Significant bilateral visual loss ABI in our clinic was 1.15 on the right 1/11; the patient's wound on the top of his right foot is just about fully closed. Still a small open area. We have been using silver alginate that stuck to the wound slightly. He has stage IV chronic renal failure but not on dialysis. Congestive heart failure the wound which started a blister on his foot was no doubt due to excess fluid. 1/18; patient's wound is closed. His edema control seems to be better. He has several healed scars on his anterior shin area but states this was from trauma from sports. Objective Constitutional Patient is hypertensive.. Pulse regular and within target range for patient.Marland Kitchen Respirations regular, non-labored and within target range.. Temperature is normal and within the target range for the patient.Marland Kitchen Appears in no distress. Vitals Time Taken: 9:56 AM, Height: 72 in, Source: Stated, Weight: 252 lbs, Source: Stated, BMI: 34.2, Temperature: 98.3 F, Pulse: 84 bpm, Respiratory Rate: 20 breaths/min, Blood Pressure: 157/82 mmHg. General Notes: does not check blood sugar regularly at home General Notes: Wound exam; the area in question is on the right dorsal foot. This is completely epithelialized. No major edema is seen Integumentary (Hair, Skin) Wound #1 status is Healed - Epithelialized. Original cause of wound was Blister. The wound is located on the Right,Dorsal Foot. The wound measures 0cm length x 0cm  width x 0cm depth; 0cm^2 area and 0cm^3 volume. There is no tunneling or undermining noted. There is a none present amount of drainage noted. The wound margin is flat and intact. There is no granulation within the wound bed. There is no necrotic tissue within the wound bed. 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 The patient can be discharged from the clinic 2. For now no secondary preventive measures however if he returns in short order you definitely need compression stockings Electronic Signature(s) Signed: 10/22/2019 4:08:35 PM By: Linton Ham MD Entered By: Linton Ham on 10/21/2019 11:20:40 -------------------------------------------------------------------------------- SuperBill Details Patient Name: Date of Service: Edward [name] 10/21/2019 Medical Record Number:[id number] Patient Account Number: [id number] Date of Birth/Sex: Treating RN: 1970-11-30 (49 y.o. Janyth Contes Primary Care Provider: [name] Other Clinician: Referring Provider: Treating Provider/Extender:Enid Maultsby, Sammuel Cooper, Douglass Rivers in Treatment: 2 Diagnosis Coding ICD-10 Codes Code Description E11.621 Type 2 diabetes mellitus with foot ulcer L97.511 Non-pressure chronic ulcer of other part of right foot limited to breakdown of skin I87.321 Chronic venous hypertension (idiopathic) with inflammation of right lower extremity E11.42 Type 2 diabetes mellitus  with diabetic polyneuropathy Facility Procedures CPT4 Code: [id number] Description: 99213 - WOUND CARE VISIT-LEV 3 EST PT Modifier: Quantity: 1 Physician Procedures CPT4 Code Description: [id number] 10258 - WC PHYS LEVEL 2 - EST PT ICD-10 Diagnosis Description L97.511 Non-pressure chronic ulcer of other part of right foot limited E11.621 Type 2 diabetes mellitus with foot ulcer Modifier: to breakdown of Quantity: 1 skin Electronic Signature(s) Signed: 10/22/2019 4:08:35 PM By: Linton Ham MD Entered By: Linton Ham on 10/21/2019 11:20:57

## 2019-10-22 NOTE — Progress Notes (Addendum)
VICENTE, WEIDLER ([id number]) Visit Report for 10/21/2019 Arrival Information Details Patient Name: Date of Service: DURAND, WITTMEYER 10/21/2019 9:45 AM Medical Record [id number] Patient Account Number: [id number] Date of Birth/Sex: Treating RN: January 31, 1971 (49 y.o. Ernestene Mention Primary Care Keishana Klinger: [name] Other Clinician: Referring Lounette Sloan: Treating Antwann Preziosi/Extender:Robson, Sammuel Cooper, Douglass Rivers in Treatment: 2 Visit Information History Since Last Visit Added or deleted any No Patient Arrived: Ambulatory medications: Arrival Time: 09:53 Any new allergies or adverse No Accompanied By: self reactions: Transfer Assistance: None Had a fall or experienced change No Patient Identification Verified: Yes in Secondary Verification Process Yes activities of daily living that may Completed: affect Patient Requires Transmission-Based No risk of falls: Precautions: Signs or symptoms of No Patient Has Alerts: No abuse/neglect since last visito Hospitalized since last visit: No Implantable device outside of the No clinic excluding cellular tissue based products placed in the center since last visit: Has Dressing in Place as Yes Prescribed: Has Footwear/Offloading in Place Yes as Prescribed: Left: Surgical Shoe with Pressure Relief Insole Pain Present Now: No Electronic Signature(s) Signed: 10/21/2019 5:35:37 PM By: Baruch Gouty RN, BSN Entered By: Baruch Gouty on 10/21/2019 09:55:56 -------------------------------------------------------------------------------- Clinic Level of Care Assessment Details Patient Name: Date of Service: NAEL, PETROSYAN 10/21/2019 9:45 AM Medical Record Number:[id number] Patient Account Number: [id number] Date of Birth/Sex: Treating RN: [birth year] (49 y.o. Janyth Contes Primary Care Emelda Kohlbeck: Other Clinician: [name] Referring Emelin Dascenzo: Treating Siara Gorder/Extender:Robson,  Sammuel Cooper, Douglass Rivers in Treatment: 2 Clinic Level of Care Assessment Items TOOL 4 Quantity Score X - Use when only an EandM is performed on FOLLOW-UP visit 1 0 ASSESSMENTS - Nursing Assessment / Reassessment X - Reassessment of Co-morbidities (includes updates in patient status) 1 10 X - Reassessment of Adherence to Treatment Plan 1 5 ASSESSMENTS - Wound and Skin Assessment / Reassessment X - Simple Wound Assessment / Reassessment - one wound 1 5 []  - Complex Wound Assessment / Reassessment - multiple wounds 0 []  - Dermatologic / Skin Assessment (not related to wound area) 0 ASSESSMENTS - Focused Assessment []  - Circumferential Edema Measurements - multi extremities 0 []  - Nutritional Assessment / Counseling / Intervention 0 X - Lower Extremity Assessment (monofilament, tuning fork, pulses) 1 5 []  - Peripheral Arterial Disease Assessment (using hand held doppler) 0 ASSESSMENTS - Ostomy and/or Continence Assessment and Care []  - Incontinence Assessment and Management 0 []  - Ostomy Care Assessment and Management (repouching, etc.) 0 PROCESS - Coordination of Care X - Simple Patient / Family Education for ongoing care 1 15 []  - Complex (extensive) Patient / Family Education for ongoing care 0 X - Staff obtains Programmer, systems, Records, Test Results / Process Orders 1 10 []  - Staff telephones HHA, Nursing Homes / Clarify orders / etc 0 []  - Routine Transfer to another Facility (non-emergent condition) 0 []  - Routine Hospital Admission (non-emergent condition) 0 []  - New Admissions / Biomedical engineer / Ordering NPWT, Apligraf, etc. 0 []  - Emergency Hospital Admission (emergent condition) 0 X - Simple Discharge Coordination 1 10 []  - Complex (extensive) Discharge Coordination 0 PROCESS - Special Needs []  - Pediatric / Minor Patient Management 0 []  - Isolation Patient Management 0 []  - Hearing / Language / Visual special needs 0 []  - Assessment of Community assistance  (transportation, D/C planning, etc.) 0 []  - Additional assistance / Altered mentation 0 []  - Support Surface(s) Assessment (bed, cushion, seat, etc.) 0 INTERVENTIONS - Wound Cleansing / Measurement X - Simple Wound Cleansing - one wound  1 5 []  - Complex Wound Cleansing - multiple wounds 0 X - Wound Imaging (photographs - any number of wounds) 1 5 []  - Wound Tracing (instead of photographs) 0 X - Simple Wound Measurement - one wound 1 5 []  - Complex Wound Measurement - multiple wounds 0 INTERVENTIONS - Wound Dressings []  - Small Wound Dressing one or multiple wounds 0 []  - Medium Wound Dressing one or multiple wounds 0 []  - Large Wound Dressing one or multiple wounds 0 []  - Application of Medications - topical 0 []  - Application of Medications - injection 0 INTERVENTIONS - Miscellaneous []  - External ear exam 0 []  - Specimen Collection (cultures, biopsies, blood, body fluids, etc.) 0 []  - Specimen(s) / Culture(s) sent or taken to Lab for analysis 0 []  - Patient Transfer (multiple staff / Civil Service fast streamer / Similar devices) 0 []  - Simple Staple / Suture removal (25 or less) 0 []  - Complex Staple / Suture removal (26 or more) 0 []  - Hypo / Hyperglycemic Management (close monitor of Blood Glucose) 0 []  - Ankle / Brachial Index (ABI) - do not check if billed separately 0 X - Vital Signs 1 5 Has the patient been seen at the hospital within the last three years: Yes Total Score: 80 Level Of Care: New/Established - Level 3 Electronic Signature(s) Signed: 10/21/2019 6:02:32 PM By: Levan Hurst RN, BSN Entered By: Levan Hurst on 10/21/2019 10:56:47 -------------------------------------------------------------------------------- Encounter Discharge Information Details Patient Name: Date of Service: [name] 10/21/2019 9:45 AM Medical Record Number:[id number] Patient Account Number: [id number] Date of Birth/Sex: Treating RN: 1970/12/11 (49 y.o. Janyth Contes Primary Care  Nevena Rozenberg: [name] Other Clinician: Referring Kaelynn Igo: Treating Hiram Mciver/Extender:Robson, Sammuel Cooper, Douglass Rivers in Treatment: 2 Encounter Discharge Information Items Discharge Condition: Stable Ambulatory Status: Ambulatory Discharge Destination: Home Transportation: Private Auto Accompanied By: alone Schedule Follow-up Appointment: Yes Clinical Summary of Care: Patient Declined Electronic Signature(s) Signed: 10/21/2019 6:02:32 PM By: Levan Hurst RN, BSN Entered By: Levan Hurst on 10/21/2019 10:57:12 -------------------------------------------------------------------------------- Lower Extremity Assessment Details Patient Name: Date of Service: [name] 10/21/2019 9:45 AM Medical Record Number:[id number] Patient Account Number: [id number] Date of Birth/Sex: Treating RN: 30-Aug-1971 (49 y.o. Ernestene Mention Primary Care Lorisa Scheid: [name] Other Clinician: Referring Jaydy Fitzhenry: Treating Tasneem Cormier/Extender:Robson, Sammuel Cooper, Charlane Ferretti Weeks in Treatment: 2 Edema Assessment Assessed: [Left: No] [Right: No] Edema: [Left: Ye] [Right: s] Calf Left: Right: Point of Measurement: 41 cm From Medial Instep cm 42.3 cm Ankle Left: Right: Point of Measurement: 12 cm From Medial Instep cm 25.5 cm Vascular Assessment Pulses: Dorsalis Pedis Palpable: [Right:Yes] Electronic Signature(s) Signed: 10/21/2019 5:35:37 PM By: Baruch Gouty RN, BSN Entered By: Baruch Gouty on 10/21/2019 10:08:45 -------------------------------------------------------------------------------- Multi Wound Chart Details Patient Name: Date of Service: [name] 10/21/2019 9:45 AM Medical Record Number:[id number] Patient Account Number: [id number] Date of Birth/Sex: Treating RN: July 02, 1971 (49 y.o. M) Primary Care Theophile Harvie: [name] Other Clinician: Referring Nyema Hachey: Treating Grady Mohabir/Extender:Robson, Sammuel Cooper, Enobong Weeks in Treatment:  2 Vital Signs Height(in): 72 Pulse(bpm): 54 Weight(lbs): 252 Blood Pressure(mmHg): 157/82 Body Mass Index(BMI): 34 Temperature(F): 98.3 Respiratory 20 Rate(breaths/min): Photos: [1:No Photos] [N/A:N/A] Wound Location: [1:Right Foot - Dorsal] [N/A:N/A] Wounding Event: [1:Blister] [N/A:N/A] Primary Etiology: [1:Diabetic Wound/Ulcer of the N/A Lower Extremity] Comorbid History: [1:Cataracts, Glaucoma, Anemia, Congestive Heart Failure, Hypertension, Type II Diabetes, End Stage Renal Disease, Gout, Neuropathy] [N/A:N/A] Date Acquired: [1:09/03/2019] [N/A:N/A] Weeks of Treatment: [1:2] [N/A:N/A] Wound Status: [1:Healed - Epithelialized] [N/A:N/A] Measurements L x W x D 0x0x0 [N/A:N/A] (cm) Area (cm) : [  1:0] [N/A:N/A] Volume (cm) : [1:0] [N/A:N/A] % Reduction in Area: [1:100.00%] [N/A:N/A] % Reduction in Volume: [1:100.00%] [N/A:N/A] Classification: [1:Grade 2] [N/A:N/A] Exudate Amount: [1:None Present] [N/A:N/A] Wound Margin: [1:Flat and Intact] [N/A:N/A] Granulation Amount: [1:None Present (0%)] [N/A:N/A] Necrotic Amount: [1:None Present (0%)] [N/A:N/A] Exposed Structures: [1:Fascia: No Fat Layer (Subcutaneous Tissue) Exposed: No Tendon: No Muscle: No Joint: No Bone: No Large (67-100%)] [N/A:N/A N/A] Treatment Notes Electronic Signature(s) Signed: 10/22/2019 4:08:35 PM By: Linton Ham MD Entered By: Linton Ham on 10/21/2019 11:17:59 -------------------------------------------------------------------------------- Multi-Disciplinary Care Plan Details Patient Name: Date of Service: [name] 10/21/2019 9:45 AM Medical Record [id number] Patient Account Number: [id number] Date of Birth/Sex: Treating RN: 1971/04/28 (49 y.o. Janyth Contes Primary Care Jalani Cullifer: [name] Other Clinician: Referring Gerrod Maule: Treating Kalieb Freeland/Extender:Robson, Sammuel Cooper, Douglass Rivers in Treatment: 2 Active Inactive Electronic Signature(s) Signed:  10/21/2019 6:02:32 PM By: Levan Hurst RN, BSN Entered By: Levan Hurst on 10/21/2019 10:56:12 -------------------------------------------------------------------------------- Pain Assessment Details Patient Name: Date of Service: MATHIEU, SCHLOEMER 10/21/2019 9:45 AM Medical Record Number:[id number] Patient Account Number: [id number] Date of Birth/Sex: Treating RN: 1971/02/10 (49 y.o. Ernestene Mention Primary Care Alyia Lacerte: [name] Other Clinician: Referring Mahamadou Weltz: Treating Tammey Deeg/Extender:Robson, Sammuel Cooper, Douglass Rivers in Treatment: 2 Active Problems Location of Pain Severity and Description of Pain Patient Has Paino No Site Locations Rate the pain. Current Pain Level: 0 Pain Management and Medication Current Pain Management: Electronic Signature(s) Signed: 10/21/2019 5:35:37 PM By: Baruch Gouty RN, BSN Entered By: Baruch Gouty on 10/21/2019 10:07:59 -------------------------------------------------------------------------------- Patient/Caregiver Education Details Raulston, Johnson 1/18/2021andnbsp9:45 Patient Name: Date of Service: L. AM Medical Record Patient Account Number: [id number] Number: Treating RN: Levan Hurst Date of Birth/Gender: 1970-11-02 (49 y.o. M) Other Clinician: Primary Care Physician:Newlin, Charlane Ferretti Treating Linton Ham Referring Physician: Physician/Extender: Evette Georges in Treatment: 2 Education Assessment Education Provided To: Patient Education Topics Provided Wound/Skin Impairment: Methods: Explain/Verbal Responses: State content correctly Electronic Signature(s) Signed: 10/21/2019 6:02:32 PM By: Levan Hurst RN, BSN Entered By: Levan Hurst on 10/21/2019 10:56:22 -------------------------------------------------------------------------------- Wound Assessment Details Patient Name: Date of Service: [name] 10/21/2019 9:45 AM Medical Record Number:[id number]  Patient Account Number: [id number] Date of Birth/Sex: Treating RN: 1970/11/06 (49 y.o. Ernestene Mention Primary Care Raihan Kimmel: [name] Other Clinician: Referring Eduardo Honor: Treating Nimisha Rathel/Extender:Robson, Sammuel Cooper, Enobong Weeks in Treatment: 2 Wound Status Wound Number: 1 Primary Diabetic Wound/Ulcer of the Lower Extremity Etiology: Wound Location: Right Foot - Dorsal Wound Healed - Epithelialized Wounding Event: Blister Status: Date Acquired: 09/03/2019 Comorbid Cataracts, Glaucoma, Anemia, Congestive Weeks Of Treatment: 2 History: Heart Failure, Hypertension, Type II Diabetes, Clustered Wound: No End Stage Renal Disease, Gout, Neuropathy Photos Wound Measurements Length: (cm) 0 % Reduction Width: (cm) 0 % Reduction Depth: (cm) 0 Epitheliali Area: (cm) 0 Tunneling: Volume: (cm) 0 Underminin Wound Description Classification: Grade 2 Foul Odor A Wound Margin: Flat and Intact Slough/Fibr Exudate Amount: None Present Wound Bed Granulation Amount: None Present (0%) Necrotic Amount: None Present (0%) Fascia Expo Fat Layer ( Tendon Expo Muscle Expo Joint Expos Bone Expose Electronic Signature(s) Signed: 10/25/2019 4:58:06 PM By: Baruch Gouty RN, BSN Signed: 10/25/2019 5:14:05 PM By: Mikeal Hawthorne EMT/HBOT Previous Signature: 10/21/2019 5:35:37 PM Version By: Alla German Entered By: Mikeal Hawthorne on 01/22 fter Cleansing: No ino No Exposed Structure sed: No Subcutaneous Tissue) Exposed: No sed: No sed: No ed: No d: No nda RN, BSN /2021 15:29:23 in Area: 100% in Volume: 100% zation: Large (67-100%) No g: No -------------------------------------------------------------------------------- Vitals Details Patient Name: Date of Service:  Scheeler, Vedant L. 10/21/2019 9:45 AM Medical Record Number:[id number] Patient Account Number: [id number] Date of Birth/Sex: Treating RN: 03-17-1971 (49 y.o. Ernestene Mention Primary Care Cacey Willow:  [name] Other Clinician: Referring Vivian Okelley: Treating Yanett Conkright/Extender:Robson, Sammuel Cooper, Douglass Rivers in Treatment: 2 Vital Signs Time Taken: 09:56 Temperature (F): 98.3 Height (in): 72 Pulse (bpm): 84 Source: Stated Respiratory Rate (breaths/min): 20 Weight (lbs): 252 Blood Pressure (mmHg): 157/82 Source: Stated Reference Range: 80 - 120 mg / dl Body Mass Index (BMI): 34.2 Notes does not check blood sugar regularly at home Electronic Signature(s) Signed: 10/21/2019 5:35:37 PM By: Baruch Gouty RN, BSN Entered By: Baruch Gouty on 10/21/2019 10:00:18

## 2019-10-24 ENCOUNTER — Telehealth: Payer: Self-pay

## 2019-10-24 NOTE — Telephone Encounter (Signed)
Patient was called and informed of lab results via voicemail. 

## 2019-10-24 NOTE — Telephone Encounter (Signed)
-----   Message from [name], MD sent at 10/22/2019 10:35 AM EST ----- Please inform him cholesterol is elevated and I have increased his dose of Lipitor.Thyroid labs are abnormal and he will need to schedule an appointment with his Endocrinologist.

## 2019-10-30 ENCOUNTER — Encounter: Payer: Self-pay | Admitting: Physician Assistant

## 2019-10-30 ENCOUNTER — Encounter (HOSPITAL_COMMUNITY): Payer: Medicare HMO

## 2019-10-30 ENCOUNTER — Ambulatory Visit: Payer: Medicare HMO | Admitting: Physician Assistant

## 2019-10-30 VITALS — BP 144/70 | HR 68 | Temp 98.0°F | Ht 72.0 in | Wt 249.1 lb

## 2019-10-30 DIAGNOSIS — R197 Diarrhea, unspecified: Secondary | ICD-10-CM

## 2019-10-30 DIAGNOSIS — E739 Lactose intolerance, unspecified: Secondary | ICD-10-CM

## 2019-10-30 DIAGNOSIS — R194 Change in bowel habit: Secondary | ICD-10-CM | POA: Diagnosis not present

## 2019-10-30 DIAGNOSIS — R159 Full incontinence of feces: Secondary | ICD-10-CM

## 2019-10-30 DIAGNOSIS — Z01818 Encounter for other preprocedural examination: Secondary | ICD-10-CM | POA: Diagnosis not present

## 2019-10-30 DIAGNOSIS — R140 Abdominal distension (gaseous): Secondary | ICD-10-CM | POA: Diagnosis not present

## 2019-10-30 MED ORDER — GLYCOPYRROLATE 2 MG PO TABS: 2.0000 mg | ORAL_TABLET | Freq: Two times a day (BID) | ORAL | 3 refills | Status: DC

## 2019-10-30 NOTE — Patient Instructions (Addendum)
If you are age 49 or older, your body mass index should be between 23-30. Your Body mass index is 33.79 kg/m. If this is out of the aforementioned range listed, please consider follow up with your Primary Care Provider.  If you are age 31 or younger, your body mass index should be between 19-25. Your Body mass index is 33.79 kg/m. If this is out of the aformentioned range listed, please consider follow up with your Primary Care Provider.   We have sent the following medications to your pharmacy for you to pick up at your convenience: Glycopyrrolate 2 mg tablets twice daily for diarrhea/spasm.   Please purchase the following medications over the counter and take as directed: Lactaid 2 tablets before meals.   You have been scheduled for a colonoscopy. Please follow written instructions given to you at your visit today.  Please pick up your prep supplies at the pharmacy within the next 1-3 days. If you use inhalers (even only as needed), please bring them with you on the day of your procedure.

## 2019-10-30 NOTE — Progress Notes (Signed)
Subjective:    Patient ID: Edward [name], male    DOB: 01/25/1971, 49 y.o.   MRN: [id number]  HPI Edward [name] is a pleasant 49 year old African-American male, new to GI today referred by Dr. Margarita Rana for evaluation of change in bowel habits with episodic diarrhea and abdominal bloating. Patient has history of hypertension, congestive heart failure, Graves' disease, adult onset diabetes mellitus, chronic kidney disease stage IV. He says he has had his current symptoms for multiple months, close to a year to a physician.  He says he knows he has lactose intolerance and tries to avoid lactose. He has been having episodes of loose stools which may last for multiple days at a time and then may eventually get back to normal bowel movements for short period of time.  He has not noticed any melena or hematochezia.  He has had a lot of gassiness and "noise" in his abdomen.  He is also been having fairly frequent episodes of nocturnal fecal incontinence of loose/diarrheal stools.  Weight has been up and down.  Nausea has been intermittent, no vomiting.  Appetite overall decreased though no significant weight loss. He has not been on any new medications over the past several months. Family history is negative for GI disease as far as he is aware.   Review of Systems Pertinent positive and negative review of systems were noted in the above HPI section.  All other review of systems was otherwise negative.  Outpatient Encounter Medications as of 10/30/2019  Medication Sig  . acetaminophen (TYLENOL) 500 MG tablet Take 500 mg by mouth every 6 (six) hours as needed.  Marland Kitchen atorvastatin (LIPITOR) 40 MG tablet Take 1 tablet (40 mg total) by mouth daily.  . calcitRIOL (ROCALTROL) 0.5 MCG capsule Take 0.5 mcg by mouth daily.  . carvedilol (COREG) 3.125 MG tablet Take 1 tablet (3.125 mg total) by mouth 2 (two) times daily.  . diclofenac sodium (VOLTAREN) 1 % GEL Apply 2 g topically 3 (three) times daily as needed  (arthritic pain).  Marland Kitchen glipiZIDE (GLUCOTROL) 5 MG tablet Take 0.5 tablets (2.5 mg total) by mouth daily. Hold for low blood glucose  . hydrALAZINE (APRESOLINE) 50 MG tablet Take 1 tablet (50 mg total) by mouth 2 (two) times daily.  Marland Kitchen levothyroxine (SYNTHROID) 112 MCG tablet TAKE 1 TABLET (112 MCG TOTAL) BY MOUTH DAILY.  . sildenafil (VIAGRA) 50 MG tablet Take 2 tablets (100 mg total) by mouth daily as needed for erectile dysfunction. At least 24 hours between doses.  Marland Kitchen torsemide (DEMADEX) 100 MG tablet Take 100 mg by mouth daily.  Marland Kitchen glycopyrrolate (ROBINUL) 2 MG tablet Take 1 tablet (2 mg total) by mouth 2 (two) times daily.  . [DISCONTINUED] oxyCODONE (ROXICODONE) 5 MG immediate release tablet Take 1 tablet (5 mg total) by mouth every 4 (four) hours as needed. (Patient not taking: Reported on 05/01/2019)   No facility-administered encounter medications on file as of 10/30/2019.   No Known Allergies Patient Active Problem List   Diagnosis Date Noted  . Chronic diastolic heart failure ([place]) 10/24/2018  . 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 ED (erectile dysfunction) 08/10/2017  . Chronic kidney disease 07/18/2017  . Arthralgia 07/18/2017  . Arthritis 07/18/2017  . Graves disease 03/02/2016  . Non compliance with medical treatment 11/26/2015  . Uncontrolled hypertension 11/26/2015  . Acute kidney injury superimposed on chronic kidney disease ([place]) 11/25/2015  . Hyperkalemia 11/25/2015  . Diabetes mellitus type 2, uncontrolled, with complications ([place]) 20/94/7096   Social History  Socioeconomic History  . Marital status: Married    Spouse name: Not on file  . Number of children: Not on file  . Years of education: Not on file  . Highest education level: Not on file  Occupational History  . Not on file  Tobacco Use  . Smoking status: Former Smoker    Packs/day: 0.75    Years: 3.00    Pack years: 2.25    Types: Cigarettes    Quit date: 05/28/2000    Years since quitting: 19.4    . Smokeless tobacco: Never Used  Substance and Sexual Activity  . Alcohol use: Yes    Alcohol/week: 0.0 standard drinks    Comment: 1 beer  ocassional  . Drug use: No  . Sexual activity: Not on file  Other Topics Concern  . Not on file  Social History Narrative  . Not on file   Social Determinants of Health   Financial Resource Strain:   . Difficulty of Paying Living Expenses: Not on file  Food Insecurity:   . Worried About Charity fundraiser in the Last Year: Not on file  . Ran Out of Food in the Last Year: Not on file  Transportation Needs:   . Lack of Transportation (Medical): Not on file  . Lack of Transportation (Non-Medical): Not on file  Physical Activity:   . Days of Exercise per Week: Not on file  . Minutes of Exercise per Session: Not on file  Stress:   . Feeling of Stress : Not on file  Social Connections:   . Frequency of Communication with Friends and Family: Not on file  . Frequency of Social Gatherings with Friends and Family: Not on file  . Attends Religious Services: Not on file  . Active Member of Clubs or Organizations: Not on file  . Attends Archivist Meetings: Not on file  . Marital Status: Not on file  Intimate Partner Violence:   . Fear of Current or Ex-Partner: Not on file  . Emotionally Abused: Not on file  . Physically Abused: Not on file  . Sexually Abused: Not on file    Edward [name]'s family history includes Bladder Cancer in his brother; Diabetes in his mother; Hypertension in his mother; Kidney disease in an other family member.      Objective:    Vitals:   10/30/19 1048  BP: (!) 144/70  Pulse: 68  Temp: 98 F (36.7 C)    Physical Exam Well-developed well-nourished African-American male in no acute distress. weight, 249 BMI 33.7 HEENT; nontraumatic normocephalic, EOMI, PER R LA, sclera anicteric. Oropharynx; not examined/mask/Covid Neck; supple, no JVD Cardiovascular; regular rate and rhythm with S1-S2, no  murmur rub or gallop Pulmonary; Clear bilaterally Abdomen; soft, nontender, nondistended, no palpable mass or hepatosplenomegaly, bowel sounds are somewhat hyper active Rectal; not done Skin; benign exam, no jaundice rash or appreciable lesions Extremities; no clubbing cyanosis or edema skin warm and dry Neuro/Psych; alert and oriented x4, grossly nonfocal mood and affect appropriate       Assessment & Plan:   #64 49 year old male, diabetic on oral agents with several month history of change in bowel habits with frequent loose stools, intermittent nocturnal fecal incontinence with loose/diarrheal stool, and generalized abdominal gassiness and bloating with decrease in appetite. Etiology of symptoms is not known.  Will need to consider IBD, IBS, visceral neuropathy, neoplasm, bacterial overgrowth.  #2 lactose intolerance #3 chronic kidney disease stage IV #5 Graves' disease 6.  Congestive  heart failure 7.  Hypertension  Plan; strict lactose-free diet Trial of Lactaid to p.o. AC We will try glycopyrrolate 2 mg p.o. twice daily with second dose to be taken at bedtime Patient will be scheduled for colonoscopy with Dr. Carlean Purl.  Procedure was discussed in detail with the patient including indications risks and benefits and he is agreeable to proceed.  Manasa Spease S Rhonin Trott PA-C 10/30/2019   Cc: [name], MD

## 2019-10-31 DIAGNOSIS — E113511 Type 2 diabetes mellitus with proliferative diabetic retinopathy with macular edema, right eye: Secondary | ICD-10-CM | POA: Diagnosis not present

## 2019-11-04 MED FILL — CALCITRIOL 0.5 MCG CAPS: 0.5 | 30 days supply | Qty: 120 | Fill #0

## 2019-11-05 ENCOUNTER — Ambulatory Visit (HOSPITAL_COMMUNITY)
Admission: RE | Admit: 2019-11-05 | Discharge: 2019-11-05 | Disposition: A | Discharge status: Home / Self Care | Payer: Medicare HMO | Source: Ambulatory Visit | Attending: Nephrology | Admitting: Nephrology

## 2019-11-05 ENCOUNTER — Other Ambulatory Visit: Payer: Self-pay

## 2019-11-05 VITALS — BP 162/95 | HR 77 | Temp 98.1°F | Resp 18

## 2019-11-05 DIAGNOSIS — N184 Chronic kidney disease, stage 4 (severe): Secondary | ICD-10-CM | POA: Insufficient documentation

## 2019-11-05 DIAGNOSIS — N179 Acute kidney failure, unspecified: Secondary | ICD-10-CM | POA: Diagnosis not present

## 2019-11-05 DIAGNOSIS — N189 Chronic kidney disease, unspecified: Secondary | ICD-10-CM | POA: Insufficient documentation

## 2019-11-05 LAB — FERRITIN: Ferritin: 518 ng/mL — ABNORMAL HIGH (ref 24–336)

## 2019-11-05 LAB — IRON AND TIBC
Iron: 94 ug/dL (ref 45–182)
Saturation Ratios: 31 % (ref 17.9–39.5)
TIBC: 308 ug/dL (ref 250–450)
UIBC: 214 ug/dL

## 2019-11-05 LAB — HEMOGLOBIN A1C: Hemoglobin A1C: 10.8

## 2019-11-05 MED ORDER — EPOETIN ALFA-EPBX 10000 UNIT/ML IJ SOLN
INTRAMUSCULAR | Status: AC
  Filled 2019-11-05: qty 2

## 2019-11-05 MED ORDER — EPOETIN ALFA-EPBX 10000 UNIT/ML IJ SOLN
20000.0000 [IU] | INTRAMUSCULAR | Status: DC
  Administered 2019-11-05: 20000 [IU] via SUBCUTANEOUS

## 2019-11-06 LAB — POCT HEMOGLOBIN-HEMACUE: Hemoglobin: 10.8 g/dL — ABNORMAL LOW (ref 13.0–17.0)

## 2019-11-07 ENCOUNTER — Other Ambulatory Visit: Payer: Self-pay | Admitting: Internal Medicine

## 2019-11-07 MED FILL — hydrALAZINE HCL 50 MG TABS: 50 | 60 days supply | Qty: 120 | Fill #2

## 2019-11-07 MED FILL — LEVOTHYROXINE 112 MCG TAB: 112 | 30 days supply | Qty: 30 | Fill #0

## 2019-11-11 ENCOUNTER — Telehealth: Payer: Self-pay | Admitting: Physician Assistant

## 2019-11-11 ENCOUNTER — Other Ambulatory Visit: Payer: Self-pay

## 2019-11-11 ENCOUNTER — Other Ambulatory Visit: Payer: Self-pay | Admitting: Internal Medicine

## 2019-11-11 ENCOUNTER — Ambulatory Visit (INDEPENDENT_AMBULATORY_CARE_PROVIDER_SITE_OTHER): Payer: Medicare HMO

## 2019-11-11 DIAGNOSIS — Z1159 Encounter for screening for other viral diseases: Secondary | ICD-10-CM | POA: Diagnosis not present

## 2019-11-11 MED FILL — TORSEMIDE 100 MG TABLET: 100 | 90 days supply | Qty: 180 | Fill #1

## 2019-11-11 NOTE — Telephone Encounter (Signed)
Called the patient back. No answer. Left a message for the patient. Advised it is necessary to repeat the test.

## 2019-11-12 ENCOUNTER — Telehealth: Payer: Self-pay | Admitting: Internal Medicine

## 2019-11-12 LAB — SARS CORONAVIRUS 2 (TAT 6-24 HRS): SARS Coronavirus 2: NEGATIVE

## 2019-11-12 NOTE — Telephone Encounter (Signed)
Pt has questions regarding colonoscopy tomorrow.

## 2019-11-12 NOTE — Telephone Encounter (Signed)
Pt wanted to know how long he would be here for his appointment tomorrow.  Explained to him it will be roughly 3 hours.

## 2019-11-13 ENCOUNTER — Encounter: Payer: Self-pay | Admitting: Internal Medicine

## 2019-11-13 ENCOUNTER — Other Ambulatory Visit: Payer: Self-pay

## 2019-11-13 ENCOUNTER — Ambulatory Visit (AMBULATORY_SURGERY_CENTER): Payer: Medicare HMO | Admitting: Internal Medicine

## 2019-11-13 VITALS — BP 122/86 | HR 72 | Temp 96.0°F | Resp 28 | Ht 72.0 in | Wt 249.0 lb

## 2019-11-13 DIAGNOSIS — E119 Type 2 diabetes mellitus without complications: Secondary | ICD-10-CM | POA: Diagnosis not present

## 2019-11-13 DIAGNOSIS — D123 Benign neoplasm of transverse colon: Secondary | ICD-10-CM | POA: Diagnosis not present

## 2019-11-13 DIAGNOSIS — R197 Diarrhea, unspecified: Secondary | ICD-10-CM

## 2019-11-13 DIAGNOSIS — I10 Essential (primary) hypertension: Secondary | ICD-10-CM | POA: Diagnosis not present

## 2019-11-13 DIAGNOSIS — R140 Abdominal distension (gaseous): Secondary | ICD-10-CM | POA: Diagnosis not present

## 2019-11-13 DIAGNOSIS — D120 Benign neoplasm of cecum: Secondary | ICD-10-CM

## 2019-11-13 DIAGNOSIS — D125 Benign neoplasm of sigmoid colon: Secondary | ICD-10-CM | POA: Diagnosis not present

## 2019-11-13 MED ORDER — SODIUM CHLORIDE 0.9 % IV SOLN: 500.0000 mL | Freq: Once | INTRAVENOUS | Status: DC

## 2019-11-13 NOTE — Progress Notes (Signed)
VS by KA. Temp by JB.

## 2019-11-13 NOTE — Progress Notes (Signed)
Called to room to assist during endoscopic procedure.  Patient ID and intended procedure confirmed with present staff. Received instructions for my participation in the procedure from the performing physician.  

## 2019-11-13 NOTE — Progress Notes (Signed)
Report to PACU, RN, vss, BBS= Clear.  

## 2019-11-13 NOTE — Op Note (Signed)
[place] Patient Name: Edward [name] Procedure Date: 11/13/2019 10:27 AM MRN: [id number] Endoscopist: Gatha Mayer , MD Age: 49 Referring MD:  Date of Birth: March 31, 1971 Gender: Male Account #: [id number] Procedure:                Colonoscopy Indications:              Clinically significant diarrhea of unexplained                            origin Medicines:                Propofol per Anesthesia, Monitored Anesthesia Care Procedure:                Pre-Anesthesia Assessment:                           - Prior to the procedure, a History and Physical                            was performed, and patient medications and                            allergies were reviewed. The patient's tolerance of                            previous anesthesia was also reviewed. The risks                            and benefits of the procedure and the sedation                            options and risks were discussed with the patient.                            All questions were answered, and informed consent                            was obtained. Prior Anticoagulants: The patient has                            taken no previous anticoagulant or antiplatelet                            agents. ASA Grade Assessment: III - A patient with                            severe systemic disease. After reviewing the risks                            and benefits, the patient was deemed in                            satisfactory condition to undergo the procedure.  After obtaining informed consent, the colonoscope                            was passed under direct vision. Throughout the                            procedure, the patient's blood pressure, pulse, and                            oxygen saturations were monitored continuously. The                            Colonoscope was introduced through the anus and                            advanced to the the cecum,  identified by                            appendiceal orifice and ileocecal valve. The                            colonoscopy was performed without difficulty. The                            patient tolerated the procedure well. The quality                            of the bowel preparation was excellent. The                            ileocecal valve, appendiceal orifice, and rectum                            were photographed. The bowel preparation used was                            Miralax via split dose instruction. Scope In: 10:46:30 AM Scope Out: 11:05:45 AM Scope Withdrawal Time: 0 hours 12 minutes 0 seconds  Total Procedure Duration: 0 hours 19 minutes 15 seconds  Findings:                 The perianal and digital rectal examinations were                            normal. Pertinent negatives include normal prostate                            (size, shape, and consistency).                           Three sessile polyps were found in the sigmoid                            colon, transverse colon and cecum. The polyps were  3 to 5 mm in size. These polyps were removed with a                            cold snare. Resection and retrieval were complete.                            Verification of patient identification for the                            specimen was done. Estimated blood loss was minimal.                           A 1 to 2 mm polyp was found in the sigmoid colon.                            The polyp was sessile. The polyp was removed with a                            cold biopsy forceps. Resection and retrieval were                            complete. Verification of patient identification                            for the specimen was done. Estimated blood loss was                            minimal.                           The exam was otherwise without abnormality on                            direct and retroflexion views.                            Biopsies for histology were taken with a cold                            forceps from the ascending colon, transverse colon,                            descending colon, sigmoid colon and rectum for                            evaluation of microscopic colitis. Estimated blood                            loss was minimal. Complications:            No immediate complications. Estimated Blood Loss:     Estimated blood loss was minimal. Impression:               - Three 3 to 5 mm polyps in the  sigmoid colon, in                            the transverse colon and in the cecum, removed with                            a cold snare. Resected and retrieved.                           - One 1 to 2 mm polyp in the sigmoid colon, removed                            with a cold biopsy forceps. Resected and retrieved.                           - The examination was otherwise normal on direct                            and retroflexion views.                           - Biopsies were taken with a cold forceps from the                            ascending colon, transverse colon, descending                            colon, sigmoid colon and rectum for evaluation of                            microscopic colitis. Recommendation:           - Patient has a contact number available for                            emergencies. The signs and symptoms of potential                            delayed complications were discussed with the                            patient. Return to normal activities tomorrow.                            Written discharge instructions were provided to the                            patient.                           - Resume previous diet.                           - Continue present medications.                           -  Repeat colonoscopy is recommended. The                            colonoscopy date will be determined after pathology                             results from today's exam become available for                            review. Gatha Mayer, MD 11/13/2019 11:14:55 AM This report has been signed electronically.

## 2019-11-13 NOTE — Patient Instructions (Addendum)
I found and removed 4 small polyps - nothing to do with the diarrhea and bloating.  I took biopsies of the colon lining (looked normal) to see if there is microscopic inflammation causing your problems.  I will contact you with biopsy results and recommendations when the results are available.  I appreciate the opportunity to care for you. Gatha Mayer, MD, FACG  YOU HAD AN ENDOSCOPIC PROCEDURE TODAY AT [facility]:   Refer to the procedure report that was given to you for any specific questions about what was found during the examination.  If the procedure report does not answer your questions, please call your gastroenterologist to clarify.  If you requested that your care partner not be given the details of your procedure findings, then the procedure report has been included in a sealed envelope for you to review at your convenience later.  **Handout given on polyps**  YOU SHOULD EXPECT: Some feelings of bloating in the abdomen. Passage of more gas than usual.  Walking can help get rid of the air that was put into your GI tract during the procedure and reduce the bloating. If you had a lower endoscopy (such as a colonoscopy or flexible sigmoidoscopy) you may notice spotting of blood in your stool or on the toilet paper. If you underwent a bowel prep for your procedure, you may not have a normal bowel movement for a few days.  Please Note:  You might notice some irritation and congestion in your nose or some drainage.  This is from the oxygen used during your procedure.  There is no need for concern and it should clear up in a day or so.  SYMPTOMS TO REPORT IMMEDIATELY:   Following lower endoscopy (colonoscopy or flexible sigmoidoscopy):  Excessive amounts of blood in the stool  Significant tenderness or worsening of abdominal pains  Swelling of the abdomen that is new, acute  Fever of 100F or higher   For urgent or emergent issues, a gastroenterologist can be  reached at any hour by calling [phone].   DIET:  We do recommend a small meal at first, but then you may proceed to your regular diet.  Drink plenty of fluids but you should avoid alcoholic beverages for 24 hours.  ACTIVITY:  You should plan to take it easy for the rest of today and you should NOT DRIVE or use heavy machinery until tomorrow (because of the sedation medicines used during the test).    FOLLOW UP: Our staff will call the number listed on your records 48-72 hours following your procedure to check on you and address any questions or concerns that you may have regarding the information given to you following your procedure. If we do not reach you, we will leave a message.  We will attempt to reach you two times.  During this call, we will ask if you have developed any symptoms of COVID 19. If you develop any symptoms (ie: fever, flu-like symptoms, shortness of breath, cough etc.) before then, please call [phone].  If you test positive for Covid 19 in the 2 weeks post procedure, please call and report this information to [place].    If any biopsies were taken you will be contacted by phone or by letter within the next 1-3 weeks.  Please call us at [phone] if you have not heard about the biopsies in 3 weeks.    SIGNATURES/CONFIDENTIALITY: You and/or your care partner have signed paperwork which will be entered  into your electronic medical record.  These signatures attest to the fact that that the information above on your After Visit Summary has been reviewed and is understood.  Full responsibility of the confidentiality of this discharge information lies with you and/or your care-partner.

## 2019-11-15 ENCOUNTER — Telehealth: Payer: Self-pay | Admitting: *Deleted

## 2019-11-15 ENCOUNTER — Other Ambulatory Visit: Payer: Self-pay

## 2019-11-15 ENCOUNTER — Encounter: Payer: Self-pay | Admitting: Internal Medicine

## 2019-11-15 ENCOUNTER — Ambulatory Visit (INDEPENDENT_AMBULATORY_CARE_PROVIDER_SITE_OTHER): Payer: Medicare HMO | Admitting: Internal Medicine

## 2019-11-15 ENCOUNTER — Telehealth: Payer: Self-pay

## 2019-11-15 VITALS — BP 130/80 | HR 83 | Ht 72.0 in | Wt 251.0 lb

## 2019-11-15 DIAGNOSIS — E0500 Thyrotoxicosis with diffuse goiter without thyrotoxic crisis or storm: Secondary | ICD-10-CM

## 2019-11-15 DIAGNOSIS — N184 Chronic kidney disease, stage 4 (severe): Secondary | ICD-10-CM

## 2019-11-15 DIAGNOSIS — E890 Postprocedural hypothyroidism: Secondary | ICD-10-CM

## 2019-11-15 DIAGNOSIS — E1122 Type 2 diabetes mellitus with diabetic chronic kidney disease: Secondary | ICD-10-CM | POA: Diagnosis not present

## 2019-11-15 NOTE — Telephone Encounter (Signed)
  Follow up Call-  Call back number 11/13/2019  Post procedure Call Back phone  # [phone]  Permission to leave phone message Yes  Some recent data might be hidden     Patient questions:  Do you have a fever, pain , or abdominal swelling? No. Pain Score  0 *  Have you tolerated food without any problems? Yes.    Have you been able to return to your normal activities? Yes.    Do you have any questions about your discharge instructions: Diet   No. Medications  No. Follow up visit  No.  Do you have questions or concerns about your Care? No.  Actions: * If pain score is 4 or above: No action needed, pain <4.  1. Have you developed a fever since your procedure? no  2.   Have you had an respiratory symptoms (SOB or cough) since your procedure? no  3.   Have you tested positive for COVID 19 since your procedure no  4.   Have you had any family members/close contacts diagnosed with the COVID 19 since your procedure?  no   If yes to any of these questions please route to Joylene John, RN and Alphonsa Gin, Therapist, sports.

## 2019-11-15 NOTE — Progress Notes (Signed)
Patient ID: Edward [name], male   DOB: [birth year], 49 y.o.   MRN: [id number]  This visit occurred during the SARS-CoV-2 public health emergency.  Safety protocols were in place, including screening questions prior to the visit, additional usage of staff PPE, and extensive cleaning of exam room while observing appropriate contact time as indicated for disinfecting solutions.   HPI  Edward GODSHALL Sr. is a 49 y.o.-year-old male, initially referred by his PCP, Dr. Feliciana Rossetti, returning for f/u for Graves ds, now with post ablative hypothyroidism, developed since last visit.  Last visit a year and 3 months ago, which she was again lost for follow-up.  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  Since last visit, he had a Thyroid Uptake and Scan (05/24/2017): 1. Elevated 24 hour radioactive iodine uptake equal to 36.9%. 2. Heterogeneous tracer activity within both lobes of the thyroid gland suggesting multinodular goiter.  He had RAI Tx (06/23/2017).  Afterwards, he was noncompliant with his levothyroxine and with his office visit and TSH remains very elevated  Pt is on levothyroxine 112 mcg daily, taken: - late at night! - fasting - at least 30 min from b'fast - + Ca (along with LT4) - no po Fe, MVI, PPIs - not on Biotin  Reviewed his TFTs: Lab Results  Component Value Date   TSH 79.400 (H) 10/21/2019   TSH >49.10 (H) 10/22/2018   TSH 86.600 (H) 08/20/2018   TSH <0.01 (L) 08/10/2017   TSH <0.01 (L) 05/12/2017   TSH <0.01 (L) 02/21/2017   TSH 0.04 (L) 09/20/2016   TSH 0.03 (L) 05/31/2016   TSH 0.05 (L) 04/11/2016   TSH 0.07 (L) 02/26/2016   FREET4 0.41 (L) 10/21/2019   FREET4 0.26 (L) 10/22/2018   FREET4 2.92 (H) 08/10/2017   FREET4 1.69 (H) 05/12/2017   FREET4 1.48 02/21/2017   FREET4 1.50 09/20/2016   FREET4  1.24 05/31/2016   FREET4 1.14 04/11/2016   FREET4 1.97 (H) 02/26/2016   FREET4 2.1 (H) 01/08/2016    His TSI's were elevated: Lab Results  Component Value Date   TSI 463 (H) 02/26/2016   Pt denies: - feeling nodules in neck - hoarseness - dysphagia - choking - SOB with lying down  Pt does not have a FH of thyroid ds. No FH of thyroid cancer. No h/o radiation tx to head or neck.  No herbal supplements. No Biotin use. No recent steroids use.   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  HbA1c was very high recently, but he thinks this was an erroneous read: Lab Results  Component Value Date   HGBA1C 10.8 11/05/2019   He was recently restarted on: - Glipizide 2.5 mg before b'fast  He is not checking sugars.  He had several eye surgeries - OU DR. 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  ROS: Constitutional: + weight gain/+ weight loss, no fatigue, no subjective hyperthermia, no subjective hypothermia Eyes: no blurry vision, no xerophthalmia ENT: no sore throat, + see HPI Cardiovascular: no CP/no SOB/no palpitations/+ leg swelling Respiratory: no cough/no SOB/no wheezing Gastrointestinal: no N/no V/no D/no C/no acid reflux Musculoskeletal: no muscle aches/no joint aches Skin: no rashes, no hair loss Neurological: no tremors/no numbness/no tingling/no dizziness  I reviewed pt's medications, allergies, PMH, social  hx, family hx, and changes were documented in the history of present illness. Otherwise, unchanged from my initial visit note.  Past Medical History:  Diagnosis Date  . Anemia   . Arthritis   . Blind    Left eye  . Cataract   . CHF (congestive heart failure) ([place])   . Chronic kidney disease    stage 5  . Depression    situatuional  . Diabetes mellitus    Type II  . Graves disease 2014  . Headache    in past  . Hyperlipidemia   . Hypertension   . Hypothyroidism   . Legally blind    right eye  .  Neuropathy   . Non-compliance   . Shortness of breath dyspnea    "Better since I've been taking my medications"   Past Surgical History:  Procedure Laterality Date  . AV FISTULA PLACEMENT Right 04/22/2019   Procedure: RIGHT ARM ARTERIOVENOUS (AV) FISTULA CREATION;  Surgeon: Angelia Mould, MD;  Location: [facility];  Service: Vascular;  Laterality: Right;  . CIRCUMCISION     as a child, around 4 years of age  . EYE SURGERY Bilateral    "several "  . PARS PLANA VITRECTOMY Right 03/25/2016   Procedure: PARS PLANA VITRECTOMY 25 GAUGE FOR ENDOPHTHALMITIS;  Surgeon: Jalene Mullet, MD;  Location: [facility];  Service: Ophthalmology;  Laterality: Right;  . WISDOM TOOTH EXTRACTION     Social History   Social History  . Marital Status: Married    Spouse Name: N/A  . Number of Children: 5: 25-4 y/o (2017)   Occupational History  . Chef-cook   Social History Main Topics  . Smoking status: Former Smoker -- 0.75 packs/day for 3 years    Types: Cigarettes    Quit date: 2004  . Smokeless tobacco: Not on file  . Alcohol Use:      Comment: 1 beer every night  . Drug Use: No   Current Outpatient Medications on File Prior to Visit  Medication Sig Dispense Refill  . acetaminophen (TYLENOL) 500 MG tablet Take 500 mg by mouth every 6 (six) hours as needed.    Marland Kitchen atorvastatin (LIPITOR) 40 MG tablet Take 1 tablet (40 mg total) by mouth daily. 30 tablet 6  . calcitRIOL (ROCALTROL) 0.5 MCG capsule Take 0.5 mcg by mouth daily.    . carvedilol (COREG) 3.125 MG tablet Take 1 tablet (3.125 mg total) by mouth 2 (two) times daily. 90 tablet 0  . diclofenac sodium (VOLTAREN) 1 % GEL Apply 2 g topically 3 (three) times daily as needed (arthritic pain).    Marland Kitchen glipiZIDE (GLUCOTROL) 5 MG tablet Take 0.5 tablets (2.5 mg total) by mouth daily. Hold for low blood glucose 15 tablet 6  . glycopyrrolate (ROBINUL) 2 MG tablet Take 1 tablet (2 mg total) by mouth 2 (two) times daily. 180 tablet 3  . hydrALAZINE  (APRESOLINE) 50 MG tablet Take 1 tablet (50 mg total) by mouth 2 (two) times daily. 60 tablet 3  . levothyroxine (SYNTHROID) 112 MCG tablet TAKE 1 TABLET (112 MCG TOTAL) BY MOUTH DAILY. 30 tablet 0  . sildenafil (VIAGRA) 50 MG tablet Take 2 tablets (100 mg total) by mouth daily as needed for erectile dysfunction. At least 24 hours between doses. 10 tablet 1  . torsemide (DEMADEX) 100 MG tablet Take 100 mg by mouth daily.  6   No current facility-administered medications on file prior to visit.   No Known Allergies Family History  Problem Relation Age of Onset  . Hypertension Mother   . Diabetes Mother   . Bladder Cancer Brother   . Kidney disease Other   . Colon cancer Neg Hx   . Rectal cancer Neg Hx    PE: BP 130/80   Pulse 83   Ht 6' (1.829 m)   Wt 251 lb (113.9 kg)   SpO2 99%   BMI 34.04 kg/m   Wt Readings from Last 3 Encounters:  11/15/19 251 lb (113.9 kg)  11/13/19 249 lb (112.9 kg)  10/30/19 249 lb 2 oz (113 kg)   Constitutional: overweight, in NAD Eyes: PERRLA, EOMI, + exophthalmos ENT: moist mucous membranes, + slight left thyromegaly, no cervical lymphadenopathy Cardiovascular: RRR, No MRG, + bilateral LE pitting edema Respiratory: CTA B Gastrointestinal: abdomen soft, NT, ND, BS+ Musculoskeletal: no deformities, strength intact in all 4 Skin: moist, warm, no rashes Neurological: no tremor with outstretched hands, DTR normal in all 4  ASSESSMENT: 1. Postablative hypothyroidism  2. H/o Graves ds - thyrotoxicosis since at least 2014  3.  Type 2 diabetes, uncontrolled  PLAN:  1.  Patient with post ablative hypothyroidism after RAI treatment for Graves' disease.  He returns after long absence.  After he developed hypothyroidism, we started levothyroxine but he was noncompliant with the medication and TSH is very high, right before last visit at 86.6, and remained high since then.  -At last visit we discussed at length about the risks of uncontrolled  hypothyroidism and we again reviewed them today: Fatigue, weight gain, cold intolerance, depression, weakness, constipation, dry skin, hair loss but also long-term hypertension, CHF, dementia. I advised him that if he continues to have uncontrolled hypothyroidism, he may not be able to drive anymore. - latest thyroid labs reviewed with pt: Lab Results  Component Value Date   TSH 79.400 (H) 10/21/2019   - he continues on LT4 112 mcg daily - we discussed about taking the thyroid hormone every day, with water, >30 minutes before breakfast, separated by >4 hours from acid reflux medications, calcium, iron, multivitamins.  She is now taking it at night and not every day.  She is taking calcium along with.  Therefore, I doubt that he is absorbing any of his levothyroxine.  We discussed about the importance of moving it in the morning and separating it from any medications but especially calcium. - will check thyroid tests in 5 weeks: TSH and fT4 - If labs are abnormal, he will need to return for repeat TFTs in 1.5 months  2.  History of Graves' disease -This was diagnosed in 2014 -He had positive TSI antibodies -Had RAI treatment 06/2017 -He has some exophthalmos but no active Graves' ophthalmopathy. -He has slight left thyromegaly but no neck compression symptoms  3.  Type 2 diabetes -Uncontrolled based on the last HbA1c, 10.8%, but he feels that this was an error.  He remembers that his HbA1c was in the 7% range, option. -Was off glipizide until recently, restarted after the last HbA1c -We did not have enough time to address this problem, but will do so at next visit, once he is compliant with his medication. -I will see him back in 3 months, will we will need to repeat his HbA1c  Orders Placed This Encounter  Procedures  . TSH  . T4, free   At this visit, we discussed that he is noncompliant with labs and appointments, I will cannot help him I would not see him.  He absolutely needs to  come  back for labs and for next visit in 3 months.  Philemon Kingdom, MD PhD [facility]

## 2019-11-15 NOTE — Telephone Encounter (Signed)
Left message on follow up call. 

## 2019-11-15 NOTE — Patient Instructions (Signed)
Please continue: - Levothyroxine 112 mcg daily  Take the thyroid hormone every day, with water, at least 30 minutes before breakfast, separated by at least 4 hours from: - acid reflux medications - calcium - iron - multivitamins  Please come back for labs in 5 weeks.  Move Levothyroxine in am. Move calcium later in the day.  Please come back for a follow-up appointment in 3 months.

## 2019-11-19 ENCOUNTER — Other Ambulatory Visit: Payer: Self-pay

## 2019-11-19 ENCOUNTER — Ambulatory Visit (HOSPITAL_COMMUNITY)
Admission: RE | Admit: 2019-11-19 | Discharge: 2019-11-19 | Disposition: A | Discharge status: Home / Self Care | Payer: Medicare HMO | Source: Ambulatory Visit | Attending: Nephrology | Admitting: Nephrology

## 2019-11-19 VITALS — BP 156/91 | HR 95 | Temp 96.4°F | Resp 18

## 2019-11-19 DIAGNOSIS — N189 Chronic kidney disease, unspecified: Secondary | ICD-10-CM | POA: Insufficient documentation

## 2019-11-19 DIAGNOSIS — N185 Chronic kidney disease, stage 5: Secondary | ICD-10-CM | POA: Diagnosis not present

## 2019-11-19 DIAGNOSIS — I120 Hypertensive chronic kidney disease with stage 5 chronic kidney disease or end stage renal disease: Secondary | ICD-10-CM | POA: Diagnosis not present

## 2019-11-19 DIAGNOSIS — N179 Acute kidney failure, unspecified: Secondary | ICD-10-CM

## 2019-11-19 DIAGNOSIS — I770 Arteriovenous fistula, acquired: Secondary | ICD-10-CM | POA: Diagnosis not present

## 2019-11-19 DIAGNOSIS — N184 Chronic kidney disease, stage 4 (severe): Secondary | ICD-10-CM | POA: Diagnosis not present

## 2019-11-19 DIAGNOSIS — N2581 Secondary hyperparathyroidism of renal origin: Secondary | ICD-10-CM | POA: Diagnosis not present

## 2019-11-19 DIAGNOSIS — D631 Anemia in chronic kidney disease: Secondary | ICD-10-CM | POA: Diagnosis not present

## 2019-11-19 LAB — POCT HEMOGLOBIN-HEMACUE: Hemoglobin: 9.4 g/dL — ABNORMAL LOW (ref 13.0–17.0)

## 2019-11-19 MED ORDER — EPOETIN ALFA-EPBX 10000 UNIT/ML IJ SOLN: 20000.0000 [IU] | INTRAMUSCULAR | Status: DC

## 2019-11-19 MED ORDER — EPOETIN ALFA-EPBX 10000 UNIT/ML IJ SOLN
INTRAMUSCULAR | Status: AC
  Administered 2019-11-19: 20000 [IU] via SUBCUTANEOUS
  Filled 2019-11-19: qty 2

## 2019-11-21 ENCOUNTER — Encounter: Payer: Self-pay | Admitting: Internal Medicine

## 2019-11-21 DIAGNOSIS — Z860100 Personal history of colon polyps, unspecified: Secondary | ICD-10-CM | POA: Insufficient documentation

## 2019-11-21 DIAGNOSIS — Z860101 Personal history of adenomatous and serrated colon polyps: Secondary | ICD-10-CM

## 2019-11-21 HISTORY — DX: Personal history of colonic polyps: Z86.010

## 2019-11-21 HISTORY — DX: Personal history of adenomatous and serrated colon polyps: Z86.0101

## 2019-11-22 MED FILL — ATORVASTATIN CALCIUM 20 MG: 20 | 90 days supply | Qty: 90 | Fill #1

## 2019-11-27 ENCOUNTER — Other Ambulatory Visit: Payer: Self-pay

## 2019-11-27 ENCOUNTER — Ambulatory Visit: Payer: Medicare HMO | Attending: Family Medicine | Admitting: Family Medicine

## 2019-11-27 ENCOUNTER — Encounter: Payer: Self-pay | Admitting: Family Medicine

## 2019-11-27 VITALS — BP 143/80 | HR 86 | Ht 72.0 in | Wt 258.0 lb

## 2019-11-27 DIAGNOSIS — G8929 Other chronic pain: Secondary | ICD-10-CM

## 2019-11-27 DIAGNOSIS — N184 Chronic kidney disease, stage 4 (severe): Secondary | ICD-10-CM | POA: Diagnosis not present

## 2019-11-27 DIAGNOSIS — E1122 Type 2 diabetes mellitus with diabetic chronic kidney disease: Secondary | ICD-10-CM | POA: Diagnosis not present

## 2019-11-27 DIAGNOSIS — R600 Localized edema: Secondary | ICD-10-CM | POA: Diagnosis not present

## 2019-11-27 DIAGNOSIS — I10 Essential (primary) hypertension: Secondary | ICD-10-CM | POA: Diagnosis not present

## 2019-11-27 DIAGNOSIS — M25561 Pain in right knee: Secondary | ICD-10-CM

## 2019-11-27 LAB — GLUCOSE, POCT (MANUAL RESULT ENTRY): POC Glucose: 99 mg/dl (ref 70–99)

## 2019-11-27 NOTE — Progress Notes (Signed)
Patient is having swelling in knees and ankles.

## 2019-11-27 NOTE — Patient Instructions (Signed)
Edema  Edema is when you have too much fluid in your body or under your skin. Edema may make your legs, feet, and ankles swell up. Swelling is also common in looser tissues, like around your eyes. This is a common condition. It gets more common as you get older. There are many possible causes of edema. Eating too much salt (sodium) and being on your feet or sitting for a long time can cause edema in your legs, feet, and ankles. Hot weather may make edema worse. Edema is usually painless. Your skin may look swollen or shiny. Follow these instructions at home:  Keep the swollen body part raised (elevated) above the level of your heart when you are sitting or lying down.  Do not sit still or stand for a long time.  Do not wear tight clothes. Do not wear garters on your upper legs.  Exercise your legs. This can help the swelling go down.  Wear elastic bandages or support stockings as told by your doctor.  Eat a low-salt (low-sodium) diet to reduce fluid as told by your doctor.  Depending on the cause of your swelling, you may need to limit how much fluid you drink (fluid restriction).  Take over-the-counter and prescription medicines only as told by your doctor. Contact a doctor if:  Treatment is not working.  You have heart, liver, or kidney disease and have symptoms of edema.  You have sudden and unexplained weight gain. Get help right away if:  You have shortness of breath or chest pain.  You cannot breathe when you lie down.  You have pain, redness, or warmth in the swollen areas.  You have heart, liver, or kidney disease and get edema all of a sudden.  You have a fever and your symptoms get worse all of a sudden. Summary  Edema is when you have too much fluid in your body or under your skin.  Edema may make your legs, feet, and ankles swell up. Swelling is also common in looser tissues, like around your eyes.  Raise (elevate) the swollen body part above the level of your  heart when you are sitting or lying down.  Follow your doctor's instructions about diet and how much fluid you can drink (fluid restriction). This information is not intended to replace advice given to you by your health care provider. Make sure you discuss any questions you have with your health care provider. Document Revised: 09/22/2017 Document Reviewed: 10/07/2016 Elsevier Patient Education  2020 [employer]  

## 2019-11-27 NOTE — Progress Notes (Signed)
Subjective:  Patient ID: Edward [name], male    DOB: 19-Nov-1970  Age: 49 y.o. MRN: [id number]  CC:  Chief Complaint  Patient presents with  . Leg Swelling     HPI Edward CRYDER Sr. is a 49 year old male with a history of hypertension, type 2 diabetes mellitus (A1c 10.8), Graves' disease (status post radioactive iodine treatment in 07/2017), stage IV chronic kidney disease, erectile dysfunction who presents today for a follow-up visit.  He has swelling and pain in both lower extremities and his R knee hurts. Edema resolves at night and it retruns when he is out and about. Currently on 100mg  of Torsemide prescribed by his Nephrologist. He sometimes has blisters on his legs which subsequently resolve and scab over but has had no fever. His R knee is sometimes swollen and he has pain with ambulation described as moderate. He had a visit for chronic disease management last month and had endorsed non compliance with his medications.  Past Medical History:  Diagnosis Date  . Anemia   . Arthritis   . Blind    Left eye  . Cataract   . CHF (congestive heart failure) ([place])   . Chronic kidney disease    stage 5  . Depression    situatuional  . Diabetes mellitus    Type II  . Graves disease 2014  . Headache    in past  . Hx of adenomatous and sessile serrated colonic polyps 11/21/2019  . Hyperlipidemia   . Hypertension   . Hypothyroidism   . Legally blind    right eye  . Neuropathy   . Non-compliance   . Shortness of breath dyspnea    "Better since I've been taking my medications"    Past Surgical History:  Procedure Laterality Date  . AV FISTULA PLACEMENT Right 04/22/2019   Procedure: RIGHT ARM ARTERIOVENOUS (AV) FISTULA CREATION;  Surgeon: Angelia Mould, MD;  Location: [place];  Service: Vascular;  Laterality: Right;  . CIRCUMCISION     as a child, around 62 years of age  . EYE SURGERY Bilateral    "several "  . PARS PLANA VITRECTOMY Right 03/25/2016    Procedure: PARS PLANA VITRECTOMY 25 GAUGE FOR ENDOPHTHALMITIS;  Surgeon: Jalene Mullet, MD;  Location: [place];  Service: Ophthalmology;  Laterality: Right;  . WISDOM TOOTH EXTRACTION      Family History  Problem Relation Age of Onset  . Hypertension Mother   . Diabetes Mother   . Bladder Cancer Brother   . Kidney disease Other   . Colon cancer Neg Hx   . Rectal cancer Neg Hx     No Known Allergies  Outpatient Medications Prior to Visit  Medication Sig Dispense Refill  . acetaminophen (TYLENOL) 500 MG tablet Take 500 mg by mouth every 6 (six) hours as needed.    Marland Kitchen atorvastatin (LIPITOR) 40 MG tablet Take 1 tablet (40 mg total) by mouth daily. 30 tablet 6  . calcitRIOL (ROCALTROL) 0.5 MCG capsule Take 0.5 mcg by mouth daily.    . carvedilol (COREG) 3.125 MG tablet Take 1 tablet (3.125 mg total) by mouth 2 (two) times daily. 90 tablet 0  . diclofenac sodium (VOLTAREN) 1 % GEL Apply 2 g topically 3 (three) times daily as needed (arthritic pain).    Marland Kitchen glipiZIDE (GLUCOTROL) 5 MG tablet Take 0.5 tablets (2.5 mg total) by mouth daily. Hold for low blood glucose 15 tablet 6  . glycopyrrolate (ROBINUL) 2 MG tablet Take  1 tablet (2 mg total) by mouth 2 (two) times daily. 180 tablet 3  . hydrALAZINE (APRESOLINE) 50 MG tablet Take 1 tablet (50 mg total) by mouth 2 (two) times daily. 60 tablet 3  . levothyroxine (SYNTHROID) 112 MCG tablet TAKE 1 TABLET (112 MCG TOTAL) BY MOUTH DAILY. 30 tablet 0  . sildenafil (VIAGRA) 50 MG tablet Take 2 tablets (100 mg total) by mouth daily as needed for erectile dysfunction. At least 24 hours between doses. 10 tablet 1  . torsemide (DEMADEX) 100 MG tablet Take 100 mg by mouth daily.  6   No facility-administered medications prior to visit.     ROS Review of Systems  Constitutional: Negative for activity change and appetite change.  HENT: Negative for sinus pressure and sore throat.   Eyes: Negative for visual disturbance.  Respiratory: Negative for  cough, chest tightness and shortness of breath.   Cardiovascular: Positive for leg swelling. Negative for chest pain.  Gastrointestinal: Negative for abdominal distention, abdominal pain, constipation and diarrhea.  Endocrine: Negative.   Genitourinary: Negative for dysuria.  Skin: Negative for rash.  Allergic/Immunologic: Negative.   Neurological: Negative for weakness, light-headedness and numbness.  Psychiatric/Behavioral: Negative for dysphoric mood and suicidal ideas.    Objective:  BP (!) 143/80   Pulse 86   Ht 6' (1.829 m)   Wt 258 lb (117 kg)   SpO2 97%   BMI 34.99 kg/m   BP/Weight 11/27/2019 11/19/2019 1/49/7026  Systolic BP 378 588 502  Diastolic BP 80 91 80  Wt. (Lbs) 258 - 251  BMI 34.99 - 34.04      Physical Exam Constitutional:      Appearance: He is well-developed.  Neck:     Vascular: No JVD.  Cardiovascular:     Rate and Rhythm: Normal rate.     Heart sounds: Normal heart sounds. No murmur.  Pulmonary:     Effort: Pulmonary effort is normal.     Breath sounds: Normal breath sounds. No wheezing or rales.  Chest:     Chest wall: No tenderness.  Abdominal:     General: Bowel sounds are normal. There is no distension.     Palpations: Abdomen is soft. There is no mass.     Tenderness: There is no abdominal tenderness.  Musculoskeletal:     Right lower leg: Edema (3+) present.     Left lower leg: No edema (2+).     Comments: TTP of R knee and on ROM Slight R knee edema  Skin:    Comments: Scabs on RLE, no blisters noted  Neurological:     Mental Status: He is alert and oriented to person, place, and time.  Psychiatric:        Mood and Affect: Mood normal.     CMP Latest Ref Rng & Units 08/13/2019 04/22/2019 08/20/2018  Glucose 65 - 99 mg/dL 168(H) 225(H) 140(H)  BUN 6 - 24 mg/dL 60(H) - 36(H)  Creatinine 0.76 - 1.27 mg/dL 6.77(H) - 4.01(H)  Sodium 134 - 144 mmol/L 142 139 143  Potassium 3.5 - 5.2 mmol/L 4.5 4.5 4.2  Chloride 96 - 106 mmol/L  105 - 107(H)  CO2 20 - 29 mmol/L 20 - 20  Calcium 8.7 - 10.2 mg/dL 9.1 - 8.8  Total Protein 6.0 - 8.5 g/dL 7.2 - -  Total Bilirubin 0.0 - 1.2 mg/dL 0.3 - -  Alkaline Phos 39 - 117 IU/L 100 - -  AST 0 - 40 IU/L 33 - -  ALT 0 - 44 IU/L 33 - -    Lipid Panel     Component Value Date/Time   CHOL 137 10/21/2019 0854   TRIG 116 10/21/2019 0854   HDL 55 10/21/2019 0854   CHOLHDL 2.5 10/21/2019 0854   CHOLHDL 2.8 09/19/2016 1023   VLDL 37 (H) 09/19/2016 1023   LDLCALC 61 10/21/2019 0854    CBC    Component Value Date/Time   WBC 8.9 03/21/2018 1951   RBC 3.58 (L) 03/21/2018 1951   HGB 9.4 (L) 11/19/2019 1018   HCT 26.0 (L) 04/22/2019 0624   PLT 220 03/21/2018 1951   MCV 94.1 03/21/2018 1951   MCH 31.3 03/21/2018 1951   MCHC 33.2 03/21/2018 1951   RDW 13.1 03/21/2018 1951   LYMPHSABS 1.9 03/21/2018 1951   MONOABS 0.7 03/21/2018 1951   EOSABS 0.3 03/21/2018 1951   BASOSABS 0.0 03/21/2018 1951    Lab Results  Component Value Date   HGBA1C 10.8 11/05/2019    Assessment & Plan:   1. Type 2 diabetes mellitus with stage 4 chronic kidney disease, without long-term current use of insulin (HCC) Uncontrolled with A1c of 10.8 due to non compliance; goal is <7.0 Addresses at last visit at which time medications were resumed Counseled on adherence to medications and a diabetic diet - POCT glucose (manual entry)  2. Pedal edema Uncontrolled Due to CKD and uncontrolled on current Torsemide dose Unfortunately symptoms might not resolve completely until renal replacement is commenced; he states he is undecided about this yet Elevate feet, continue low sodium diet  3. Chronic pain of right knee Possibly underlying osteoarthritis Unable to use NSAIDS due to CKD Advised to obtain topical Voltaren    Return for Keep previously scheduled appointment - virtual.        [name], MD, FAAFP. [facility] [place], [place]     11/27/2019, 3:16 PM

## 2019-12-03 ENCOUNTER — Encounter (HOSPITAL_COMMUNITY): Payer: Medicare HMO

## 2019-12-05 ENCOUNTER — Ambulatory Visit: Payer: Medicare HMO | Admitting: Physician Assistant

## 2019-12-05 ENCOUNTER — Encounter: Payer: Self-pay | Admitting: Physician Assistant

## 2019-12-05 VITALS — BP 146/80 | HR 84 | Temp 97.9°F | Ht 70.0 in | Wt 251.2 lb

## 2019-12-05 DIAGNOSIS — Z86010 Personal history of colonic polyps: Secondary | ICD-10-CM

## 2019-12-05 DIAGNOSIS — R197 Diarrhea, unspecified: Secondary | ICD-10-CM

## 2019-12-05 NOTE — Patient Instructions (Signed)
Continue lactose free diet.   Continue Robinul forte 2 mg twice daily as needed for diarrhea.  You will need another Colonoscopy in 3 years.

## 2019-12-05 NOTE — Progress Notes (Signed)
Subjective:    Patient ID: Edward [name], male    DOB: 1971/07/18, 49 y.o.   MRN: [id number]  HPI [name] is a pleasant 49 year old African-American male established with Dr. Carlean Purl who was seen in the office by myself on 10/30/2019 with complaints of abdominal bloating and diarrhea with frequent episodes of loose stools and occasional nocturnal episodes of fecal incontinence.  He does have history of lactose intolerance.  He was advised to follow a strict lactose-free diet, add Lactaid tablets AC as needed and was started on Robinul Forte 2 mg p.o. twice daily. He underwent colonoscopy on 11/13/2019 with finding of 3 sessile polyps measuring 3 to 5 mm, one 2 mm polyp and otherwise negative exam.  Biopsies were consistent with adenomatous polyp and sessile serrated polyps, and random biopsies negative for microscopic colitis.  He is indicated for 3-year interval follow-up. His history is pertinent for hypertension, congestive heart failure, adult onset diabetes mellitus, chronic kidney disease . Today, patient says his diarrhea has actually significantly improved.  He is only had a couple of days since the colonoscopy where he has noticed diarrhea.  He has no current complaints of abdominal pain. He says he took the [employer] for a couple of weeks but has not been taking that recently.  He wonders if the diarrhea was somehow related to his medications and how he was taking his medications.  He has been spreading apart dosing.  He is on glipizide, but has been on that fairly long-term.  Review of Systems Pertinent positive and negative review of systems were noted in the above HPI section.  All other review of systems was otherwise negative.  Outpatient Encounter Medications as of 12/05/2019  Medication Sig  . acetaminophen (TYLENOL) 500 MG tablet Take 500 mg by mouth every 6 (six) hours as needed.  Marland Kitchen atorvastatin (LIPITOR) 40 MG tablet Take 1 tablet (40 mg total) by mouth daily.  .  calcitRIOL (ROCALTROL) 0.5 MCG capsule Take 0.5 mcg by mouth daily.  . carvedilol (COREG) 3.125 MG tablet Take 1 tablet (3.125 mg total) by mouth 2 (two) times daily.  . diclofenac sodium (VOLTAREN) 1 % GEL Apply 2 g topically 3 (three) times daily as needed (arthritic pain).  Marland Kitchen glipiZIDE (GLUCOTROL) 5 MG tablet Take 0.5 tablets (2.5 mg total) by mouth daily. Hold for low blood glucose  . glycopyrrolate (ROBINUL) 2 MG tablet Take 1 tablet (2 mg total) by mouth 2 (two) times daily.  . hydrALAZINE (APRESOLINE) 50 MG tablet Take 1 tablet (50 mg total) by mouth 2 (two) times daily.  Marland Kitchen levothyroxine (SYNTHROID) 112 MCG tablet TAKE 1 TABLET (112 MCG TOTAL) BY MOUTH DAILY.  . sildenafil (VIAGRA) 50 MG tablet Take 2 tablets (100 mg total) by mouth daily as needed for erectile dysfunction. At least 24 hours between doses.  Marland Kitchen torsemide (DEMADEX) 100 MG tablet Take 100 mg by mouth daily.   No facility-administered encounter medications on file as of 12/05/2019.   No Known Allergies Patient Active Problem List   Diagnosis Date Noted  . Hx of adenomatous and sessile serrated colonic polyps 11/21/2019  . Chronic diastolic heart failure ([place]) 10/24/2018  . 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 ED (erectile dysfunction) 08/10/2017  . Chronic kidney disease 07/18/2017  . Arthralgia 07/18/2017  . Arthritis 07/18/2017  . Graves disease 03/02/2016  . Non compliance with medical treatment 11/26/2015  . Uncontrolled hypertension 11/26/2015  . Acute kidney injury superimposed on chronic kidney disease ([place]) 11/25/2015  .  Hyperkalemia 11/25/2015  . Diabetes mellitus type 2, uncontrolled, with complications ([place]) 52/84/1324   Social History   Socioeconomic History  . Marital status: Married    Spouse name: Not on file  . Number of children: Not on file  . Years of education: Not on file  . Highest education level: Not on file  Occupational History  . Not on file  Tobacco Use  . Smoking status:  Former Smoker    Packs/day: 0.75    Years: 3.00    Pack years: 2.25    Types: Cigarettes    Quit date: 05/28/2000    Years since quitting: 19.5  . Smokeless tobacco: Never Used  Substance and Sexual Activity  . Alcohol use: Yes    Alcohol/week: 0.0 standard drinks    Comment: 1 beer  ocassional  . Drug use: No  . Sexual activity: Not on file  Other Topics Concern  . Not on file  Social History Narrative  . Not on file   Social Determinants of Health   Financial Resource Strain:   . Difficulty of Paying Living Expenses: Not on file  Food Insecurity:   . Worried About Charity fundraiser in the Last Year: Not on file  . Ran Out of Food in the Last Year: Not on file  Transportation Needs:   . Lack of Transportation (Medical): Not on file  . Lack of Transportation (Non-Medical): Not on file  Physical Activity:   . Days of Exercise per Week: Not on file  . Minutes of Exercise per Session: Not on file  Stress:   . Feeling of Stress : Not on file  Social Connections:   . Frequency of Communication with Friends and Family: Not on file  . Frequency of Social Gatherings with Friends and Family: Not on file  . Attends Religious Services: Not on file  . Active Member of Clubs or Organizations: Not on file  . Attends Archivist Meetings: Not on file  . Marital Status: Not on file  Intimate Partner Violence:   . Fear of Current or Ex-Partner: Not on file  . Emotionally Abused: Not on file  . Physically Abused: Not on file  . Sexually Abused: Not on file    Mr. Castronovo's family history includes Bladder Cancer in his brother; Diabetes in his mother; Hypertension in his mother; Kidney disease in an other family member.      Objective:    Vitals:   12/05/19 1015  BP: (!) 146/80  Pulse: 84  Temp: 97.9 F (36.6 C)    Physical Exam Well-developed well-nourishedAAmale in no acute distress.  Height, [id number], BMI36  HEENT; nontraumatic normocephalic, EOMI, PER R  LA, sclera anicteric. Oropharynx; not examined  Neuro/Psych; alert and oriented x4, grossly nonfocal mood and affect appropriate       Assessment & Plan:   #30 50 year old African-American male with recent diarrhea and abdominal bloating and occasional episodes of nocturnal incontinence. Symptoms have significantly improved over the past month with only sporadic episodes of diarrhea He is no longer requiring antispasmodic on a regular basis. Colonoscopy negative for microscopic colitis.  Suspect symptoms may be in part related to medications i.e. glipizide and lactose intolerance.  #2 adenomatous and sessile serrated polyps at recent colonoscopy 11/13/2019-indicated for 3-year interval follow-up. #3 adult onset diabetes mellitus #4 chronic kidney disease  Plan; I reviewed colonoscopy findings with the patient in detail. Continue strict lactose-free diet He will continue Robinul Forte 2 mg twice daily as  needed. Follow-up with Dr. Carlean Purl or myself on an as-needed basis, and will plan for follow-up colonoscopy in 3 years.   Chandra Feger S Anthony Roland PA-C 12/05/2019   Cc: [name], MD

## 2019-12-09 DIAGNOSIS — Z20822 Contact with and (suspected) exposure to covid-19: Secondary | ICD-10-CM | POA: Diagnosis not present

## 2019-12-11 ENCOUNTER — Other Ambulatory Visit: Payer: Self-pay

## 2019-12-11 ENCOUNTER — Ambulatory Visit (HOSPITAL_COMMUNITY)
Admission: RE | Admit: 2019-12-11 | Discharge: 2019-12-11 | Disposition: A | Discharge status: Home / Self Care | Payer: Medicare HMO | Source: Ambulatory Visit | Attending: Nephrology | Admitting: Nephrology

## 2019-12-11 VITALS — BP 152/89 | HR 83 | Temp 97.1°F | Resp 18

## 2019-12-11 DIAGNOSIS — N184 Chronic kidney disease, stage 4 (severe): Secondary | ICD-10-CM

## 2019-12-11 DIAGNOSIS — N179 Acute kidney failure, unspecified: Secondary | ICD-10-CM

## 2019-12-11 DIAGNOSIS — N189 Chronic kidney disease, unspecified: Secondary | ICD-10-CM | POA: Insufficient documentation

## 2019-12-11 LAB — IRON AND TIBC
Iron: 80 ug/dL (ref 45–182)
Saturation Ratios: 28 % (ref 17.9–39.5)
TIBC: 286 ug/dL (ref 250–450)
UIBC: 206 ug/dL

## 2019-12-11 LAB — POCT HEMOGLOBIN-HEMACUE: Hemoglobin: 9.9 g/dL — ABNORMAL LOW (ref 13.0–17.0)

## 2019-12-11 LAB — FERRITIN: Ferritin: 531 ng/mL — ABNORMAL HIGH (ref 24–336)

## 2019-12-11 MED ORDER — EPOETIN ALFA-EPBX 10000 UNIT/ML IJ SOLN
20000.0000 [IU] | INTRAMUSCULAR | Status: DC
  Administered 2019-12-11: 20000 [IU] via SUBCUTANEOUS

## 2019-12-11 MED ORDER — SODIUM CHLORIDE 0.9 % IV SOLN
510.0000 mg | INTRAVENOUS | Status: DC
  Filled 2019-12-11: qty 17

## 2019-12-11 MED ORDER — EPOETIN ALFA-EPBX 10000 UNIT/ML IJ SOLN
INTRAMUSCULAR | Status: AC
  Filled 2019-12-11: qty 2

## 2019-12-17 ENCOUNTER — Encounter (HOSPITAL_COMMUNITY): Payer: Medicare HMO

## 2019-12-20 ENCOUNTER — Other Ambulatory Visit: Payer: Self-pay

## 2019-12-20 ENCOUNTER — Other Ambulatory Visit (INDEPENDENT_AMBULATORY_CARE_PROVIDER_SITE_OTHER): Payer: Medicare HMO

## 2019-12-20 ENCOUNTER — Telehealth: Payer: Self-pay

## 2019-12-20 DIAGNOSIS — E890 Postprocedural hypothyroidism: Secondary | ICD-10-CM

## 2019-12-20 LAB — T4, FREE: Free T4: 0.44 ng/dL — ABNORMAL LOW (ref 0.60–1.60)

## 2019-12-20 LAB — TSH: TSH: 72.05 u[IU]/mL — ABNORMAL HIGH (ref 0.35–4.50)

## 2019-12-20 NOTE — Telephone Encounter (Signed)
-----   Message from Philemon Kingdom, MD sent at 12/20/2019  4:30 PM EDT ----- Lenna Sciara, can you please call pt: His TSH is still very high, in the 70s.  Please make sure that he is taking the levothyroxine daily, in the morning (he was taking it at night at last visit!) on an empty stomach, separated by more than 4 hours from any calcium (he was taking calcium with levothyroxine at last visit), multivitamins, iron, acid reflux medications. Also, let us increase the dose to 137 mcg daily.  Can you please send the prescription to his pharmacy?  I will recheck his tests when he comes back in May.

## 2019-12-23 MED ORDER — LEVOTHYROXINE SODIUM 137 MCG PO TABS: 137.0000 ug | ORAL_TABLET | Freq: Every day | ORAL | 1 refills | Status: DC

## 2019-12-23 NOTE — Telephone Encounter (Signed)
Spoke to patient and gave below message, he has been taking on empty stomach but only waiting 2 hours before other medications, I advise him to wait at least 4 hours, he expressed understanding and agreement.  RX sent.

## 2019-12-24 DIAGNOSIS — Z20822 Contact with and (suspected) exposure to covid-19: Secondary | ICD-10-CM | POA: Diagnosis not present

## 2019-12-25 ENCOUNTER — Ambulatory Visit (HOSPITAL_COMMUNITY)
Admission: RE | Admit: 2019-12-25 | Discharge: 2019-12-25 | Disposition: A | Discharge status: Home / Self Care | Payer: Medicare HMO | Source: Ambulatory Visit | Attending: Nephrology | Admitting: Nephrology

## 2019-12-25 ENCOUNTER — Other Ambulatory Visit: Payer: Self-pay

## 2019-12-25 VITALS — BP 162/93 | HR 82 | Temp 97.3°F | Resp 18 | Ht 70.0 in | Wt 250.0 lb

## 2019-12-25 DIAGNOSIS — N189 Chronic kidney disease, unspecified: Secondary | ICD-10-CM | POA: Insufficient documentation

## 2019-12-25 DIAGNOSIS — N179 Acute kidney failure, unspecified: Secondary | ICD-10-CM

## 2019-12-25 DIAGNOSIS — N184 Chronic kidney disease, stage 4 (severe): Secondary | ICD-10-CM | POA: Diagnosis not present

## 2019-12-25 LAB — POCT HEMOGLOBIN-HEMACUE: Hemoglobin: 9.7 g/dL — ABNORMAL LOW (ref 13.0–17.0)

## 2019-12-25 MED ORDER — EPOETIN ALFA-EPBX 10000 UNIT/ML IJ SOLN
INTRAMUSCULAR | Status: AC
  Filled 2019-12-25: qty 2

## 2019-12-25 MED ORDER — EPOETIN ALFA-EPBX 10000 UNIT/ML IJ SOLN
20000.0000 [IU] | INTRAMUSCULAR | Status: DC
  Administered 2019-12-25: 20000 [IU] via SUBCUTANEOUS

## 2019-12-25 MED ORDER — SODIUM CHLORIDE 0.9 % IV SOLN
510.0000 mg | INTRAVENOUS | Status: DC
  Filled 2019-12-25: qty 17

## 2019-12-25 NOTE — Progress Notes (Signed)
Called Dr Louie Boston office and spoke with patient regarding patient's monthly feraheme order.  Shirlean Kelly was held in Feb after his iron levels were drawn.  We drew iron and ferr levels 12/11/19 and after [place] reviewed them she said she would send Dr Carolin Sicks a message to review his feraheme order and for now  To hold the feraheme and for [place] to discontinue the monthly feraheme orders and just do his labs and injection.  If they wish to give more feraheme they will send [place] additional orders in the future.

## 2019-12-30 ENCOUNTER — Other Ambulatory Visit: Payer: Self-pay

## 2019-12-30 DIAGNOSIS — Z20822 Contact with and (suspected) exposure to covid-19: Secondary | ICD-10-CM | POA: Diagnosis not present

## 2019-12-30 NOTE — Telephone Encounter (Signed)
Patient is requesting refill on Viagra

## 2019-12-31 MED ORDER — SILDENAFIL CITRATE 50 MG PO TABS: 100.0000 mg | ORAL_TABLET | Freq: Every day | ORAL | 1 refills | Status: DC | PRN

## 2020-01-07 DIAGNOSIS — Z20822 Contact with and (suspected) exposure to covid-19: Secondary | ICD-10-CM | POA: Diagnosis not present

## 2020-01-07 DIAGNOSIS — Z03818 Encounter for observation for suspected exposure to other biological agents ruled out: Secondary | ICD-10-CM | POA: Diagnosis not present

## 2020-01-08 ENCOUNTER — Encounter (HOSPITAL_COMMUNITY): Payer: Medicare HMO

## 2020-01-09 ENCOUNTER — Other Ambulatory Visit: Payer: Self-pay | Admitting: Pharmacist

## 2020-01-09 DIAGNOSIS — I10 Essential (primary) hypertension: Secondary | ICD-10-CM

## 2020-01-09 DIAGNOSIS — E1122 Type 2 diabetes mellitus with diabetic chronic kidney disease: Secondary | ICD-10-CM

## 2020-01-09 MED ORDER — HYDRALAZINE HCL 50 MG PO TABS: 50.0000 mg | ORAL_TABLET | Freq: Two times a day (BID) | ORAL | 3 refills | Status: DC

## 2020-01-09 MED ORDER — GLUCOSE BLOOD VI STRP: ORAL_STRIP | 6 refills | Status: DC

## 2020-01-09 MED ORDER — TRUEPLUS LANCETS 33G MISC: 6 refills | Status: DC

## 2020-01-09 MED ORDER — BD SWAB SINGLE USE REGULAR PADS: MEDICATED_PAD | 6 refills | Status: AC

## 2020-01-09 MED ORDER — GLIPIZIDE 5 MG PO TABS: 2.5000 mg | ORAL_TABLET | Freq: Every day | ORAL | 6 refills | Status: DC

## 2020-01-16 DIAGNOSIS — Z03818 Encounter for observation for suspected exposure to other biological agents ruled out: Secondary | ICD-10-CM | POA: Diagnosis not present

## 2020-01-16 DIAGNOSIS — Z20822 Contact with and (suspected) exposure to covid-19: Secondary | ICD-10-CM | POA: Diagnosis not present

## 2020-01-20 DIAGNOSIS — N2581 Secondary hyperparathyroidism of renal origin: Secondary | ICD-10-CM | POA: Diagnosis not present

## 2020-01-20 DIAGNOSIS — I120 Hypertensive chronic kidney disease with stage 5 chronic kidney disease or end stage renal disease: Secondary | ICD-10-CM | POA: Diagnosis not present

## 2020-01-20 DIAGNOSIS — I770 Arteriovenous fistula, acquired: Secondary | ICD-10-CM | POA: Diagnosis not present

## 2020-01-20 DIAGNOSIS — E872 Acidosis: Secondary | ICD-10-CM | POA: Diagnosis not present

## 2020-01-20 DIAGNOSIS — N185 Chronic kidney disease, stage 5: Secondary | ICD-10-CM | POA: Diagnosis not present

## 2020-01-20 DIAGNOSIS — R609 Edema, unspecified: Secondary | ICD-10-CM | POA: Diagnosis not present

## 2020-01-20 DIAGNOSIS — D631 Anemia in chronic kidney disease: Secondary | ICD-10-CM | POA: Diagnosis not present

## 2020-01-21 ENCOUNTER — Ambulatory Visit (HOSPITAL_COMMUNITY)
Admission: RE | Admit: 2020-01-21 | Discharge: 2020-01-21 | Disposition: A | Discharge status: Home / Self Care | Payer: Medicare HMO | Source: Ambulatory Visit | Attending: Nephrology | Admitting: Nephrology

## 2020-01-21 ENCOUNTER — Other Ambulatory Visit: Payer: Self-pay

## 2020-01-21 VITALS — BP 144/91 | HR 80 | Temp 96.3°F | Resp 18

## 2020-01-21 DIAGNOSIS — N189 Chronic kidney disease, unspecified: Secondary | ICD-10-CM | POA: Diagnosis not present

## 2020-01-21 DIAGNOSIS — N179 Acute kidney failure, unspecified: Secondary | ICD-10-CM | POA: Insufficient documentation

## 2020-01-21 DIAGNOSIS — N184 Chronic kidney disease, stage 4 (severe): Secondary | ICD-10-CM | POA: Diagnosis not present

## 2020-01-21 LAB — IRON AND TIBC
Iron: 78 ug/dL (ref 45–182)
Saturation Ratios: 25 % (ref 17.9–39.5)
TIBC: 315 ug/dL (ref 250–450)
UIBC: 237 ug/dL

## 2020-01-21 LAB — FERRITIN: Ferritin: 494 ng/mL — ABNORMAL HIGH (ref 24–336)

## 2020-01-21 LAB — POCT HEMOGLOBIN-HEMACUE: Hemoglobin: 10.4 g/dL — ABNORMAL LOW (ref 13.0–17.0)

## 2020-01-21 MED ORDER — EPOETIN ALFA-EPBX 10000 UNIT/ML IJ SOLN
INTRAMUSCULAR | Status: AC
  Filled 2020-01-21: qty 2

## 2020-01-21 MED ORDER — EPOETIN ALFA-EPBX 10000 UNIT/ML IJ SOLN
20000.0000 [IU] | INTRAMUSCULAR | Status: DC
  Administered 2020-01-21: 20000 [IU] via SUBCUTANEOUS

## 2020-01-22 ENCOUNTER — Ambulatory Visit: Payer: Medicare HMO | Attending: Family Medicine | Admitting: Family Medicine

## 2020-01-22 ENCOUNTER — Encounter: Payer: Self-pay | Admitting: Family Medicine

## 2020-01-22 ENCOUNTER — Other Ambulatory Visit: Payer: Self-pay

## 2020-01-22 ENCOUNTER — Encounter (HOSPITAL_COMMUNITY): Payer: Medicare HMO

## 2020-01-22 DIAGNOSIS — N184 Chronic kidney disease, stage 4 (severe): Secondary | ICD-10-CM | POA: Diagnosis not present

## 2020-01-22 DIAGNOSIS — R600 Localized edema: Secondary | ICD-10-CM | POA: Diagnosis not present

## 2020-01-22 DIAGNOSIS — I10 Essential (primary) hypertension: Secondary | ICD-10-CM

## 2020-01-22 DIAGNOSIS — N528 Other male erectile dysfunction: Secondary | ICD-10-CM

## 2020-01-22 DIAGNOSIS — E1122 Type 2 diabetes mellitus with diabetic chronic kidney disease: Secondary | ICD-10-CM

## 2020-01-22 DIAGNOSIS — I129 Hypertensive chronic kidney disease with stage 1 through stage 4 chronic kidney disease, or unspecified chronic kidney disease: Secondary | ICD-10-CM

## 2020-01-22 MED ORDER — ACCU-CHEK GUIDE ME W/DEVICE KIT: PACK | 0 refills | Status: DC

## 2020-01-22 MED ORDER — ACCU-CHEK GUIDE VI STRP: ORAL_STRIP | 12 refills | Status: DC

## 2020-01-22 NOTE — Progress Notes (Signed)
Virtual Visit via Telephone Note  I connected with Edward [name], on 01/22/2020 at 1:34 PM by telephone due to the COVID-19 pandemic and verified that I am speaking with the correct person using two identifiers.   Consent: I discussed the limitations, risks, security and privacy concerns of performing an evaluation and management service by telephone and the availability of in person appointments. I also discussed with the patient that there may be a patient responsible charge related to this service. The patient expressed understanding and agreed to proceed.   Location of Patient: Home  Location of Provider: Clinic   Persons participating in Telemedicine visit: YEISON SIPPEL SrElmo Putt [name]-CMA Dr. Margarita Rana     History of Present Illness: Edward GODLEY Sr. is a 49 year old male with a history of hypertension, type 2 diabetes mellitus (A1c 10.8), Graves' disease (status post radioactive iodine treatment in 07/2017), stage IVchronic kidney disease, erectile dysfunction who presents today for a follow-up visit.  His pedal edema has improved after his diuretic dose was increased by Nephrology. He is yet to commence hemodialysis. States he has a refill of Coreg and note on the medication indicates he needs to schedule an appointment with his Cardiologist for additional refills; he last saw his cardiologist Dr. Harrell Gave on 04/2019.  He has been unable to obtain Viagra due to lack of insurance coverage and is wondering if there is a generic.  I have informed him I will do a prescription for sildenafil which he states cost about $300. His diabetes mellitus is uncontrolled and he does not check his sugars because he has no meter.  Past Medical History:  Diagnosis Date  . Anemia   . Arthritis   . Blind    Left eye  . Cataract   . CHF (congestive heart failure) ([place])   . Chronic kidney disease    stage 5  . Depression    situatuional  . Diabetes  mellitus    Type II  . Graves disease 2014  . Headache    in past  . Hx of adenomatous and sessile serrated colonic polyps 11/21/2019  . Hyperlipidemia   . Hypertension   . Hypothyroidism   . Legally blind    right eye  . Neuropathy   . Non-compliance   . Shortness of breath dyspnea    "Better since I've been taking my medications"   No Known Allergies  Current Outpatient Medications on File Prior to Visit  Medication Sig Dispense Refill  . acetaminophen (TYLENOL) 500 MG tablet Take 500 mg by mouth every 6 (six) hours as needed.    . Alcohol Swabs (B-D SINGLE USE SWABS REGULAR) PADS Use as directed. 100 each 6  . atorvastatin (LIPITOR) 40 MG tablet Take 1 tablet (40 mg total) by mouth daily. 30 tablet 6  . calcitRIOL (ROCALTROL) 0.5 MCG capsule Take by mouth daily. Taking 5 capsules a Day    . carvedilol (COREG) 3.125 MG tablet Take 1 tablet (3.125 mg total) by mouth 2 (two) times daily. 90 tablet 0  . diclofenac sodium (VOLTAREN) 1 % GEL Apply 2 g topically 3 (three) times daily as needed (arthritic pain).    Marland Kitchen glipiZIDE (GLUCOTROL) 5 MG tablet Take 0.5 tablets (2.5 mg total) by mouth daily. Hold for low blood glucose 15 tablet 6  . glucose blood test strip Use as instructed to check blood sugar daily. 100 each 6  . glycopyrrolate (ROBINUL) 2 MG tablet Take 1 tablet (2 mg total) by  mouth 2 (two) times daily. 180 tablet 3  . hydrALAZINE (APRESOLINE) 50 MG tablet Take 1 tablet (50 mg total) by mouth 2 (two) times daily. 60 tablet 3  . levothyroxine (SYNTHROID) 137 MCG tablet Take 1 tablet (137 mcg total) by mouth daily before breakfast. 90 tablet 1  . sildenafil (VIAGRA) 50 MG tablet Take 2 tablets (100 mg total) by mouth daily as needed for erectile dysfunction. At least 24 hours between doses. 10 tablet 1  . torsemide (DEMADEX) 100 MG tablet Take 100 mg by mouth 2 (two) times daily.   6  . TRUEplus Lancets 33G MISC Use as instructed to check blood sugar daily. 100 each 6   No  current facility-administered medications on file prior to visit.    Observations/Objective: Awake, alert, oriented 3 Not in acute distress  Lab Results  Component Value Date   HGBA1C 10.8 11/05/2019    Assessment and Plan: 1. Type 2 diabetes mellitus with stage 4 chronic kidney disease, without long-term current use of insulin (HCC) Uncontrolled with A1c of 10.8; goal is <7.0 I have sent a prescription for glucometer to his pharmacy Encouraged to keep a log of his blood sugars Noncompliance with medication regimen is largely contributory to his suboptimal control. - Blood Glucose Monitoring Suppl (ACCU-CHEK GUIDE ME) w/Device KIT; Use 3 times daily before meals  Dispense: 1 kit; Refill: 0 - glucose blood (ACCU-CHEK GUIDE) test strip; Use as instructed  Dispense: 100 each; Refill: 12  2. Pedal edema Secondary to chronic kidney disease Improving Continue torsemide  3. Other male erectile dysfunction Advised him to speak with his pharmacist regarding the options and he is to notify the clinic if he discovers cheaper options.  4. Uncontrolled hypertension He does have refill of carvedilol Advised to keep his appointment with cardiology Continue hydralazine Counseled on blood pressure goal of less than 130/80, low-sodium, DASH diet, medication compliance, 150 minutes of moderate intensity exercise per week. Discussed medication compliance, adverse effects.   Follow Up Instructions: Keep previously scheduled appointment   I discussed the assessment and treatment plan with the patient. The patient was provided an opportunity to ask questions and all were answered. The patient agreed with the plan and demonstrated an understanding of the instructions.   The patient was advised to call back or seek an in-person evaluation if the symptoms worsen or if the condition fails to improve as anticipated.     I provided 13 minutes total of non-face-to-face time during this encounter  including median intraservice time, reviewing previous notes, investigations, ordering medications, medical decision making, coordinating care and patient verbalized understanding at the end of the visit.     [name], MD, FAAFP. [facility] [place], [place]   01/22/2020, 1:34 PM

## 2020-01-22 NOTE — Progress Notes (Signed)
3 mo f/u  To f/u with general health  Question about last script he had was viagra and wanted to know if there is a generic brand due to still waiting for pharmacy to do PA.   Need refills for Coreg

## 2020-01-23 DIAGNOSIS — Z20822 Contact with and (suspected) exposure to covid-19: Secondary | ICD-10-CM | POA: Diagnosis not present

## 2020-01-23 DIAGNOSIS — Z03818 Encounter for observation for suspected exposure to other biological agents ruled out: Secondary | ICD-10-CM | POA: Diagnosis not present

## 2020-01-27 ENCOUNTER — Telehealth: Payer: Self-pay | Admitting: Family Medicine

## 2020-01-27 NOTE — Telephone Encounter (Signed)
Patient was called and he states that he picked up his testing supplies and there was no meter in the bag. Pharmacy was called to confirm, pharmacy states that meter was picked up, Patient was informed to return to pharmacy and inform them that meter was not in the bag.

## 2020-01-27 NOTE — Telephone Encounter (Signed)
Patient called and requested for a call from pcp nurse regarding personal matters. Please follow up at your earliest conveninece.

## 2020-02-03 DIAGNOSIS — Z03818 Encounter for observation for suspected exposure to other biological agents ruled out: Secondary | ICD-10-CM | POA: Diagnosis not present

## 2020-02-03 DIAGNOSIS — Z20822 Contact with and (suspected) exposure to covid-19: Secondary | ICD-10-CM | POA: Diagnosis not present

## 2020-02-04 ENCOUNTER — Ambulatory Visit (HOSPITAL_COMMUNITY)
Admission: RE | Admit: 2020-02-04 | Discharge: 2020-02-04 | Disposition: A | Discharge status: Home / Self Care | Payer: Medicare HMO | Source: Ambulatory Visit | Attending: Nephrology | Admitting: Nephrology

## 2020-02-04 VITALS — BP 157/86 | HR 85 | Temp 97.0°F | Resp 18

## 2020-02-04 DIAGNOSIS — N184 Chronic kidney disease, stage 4 (severe): Secondary | ICD-10-CM | POA: Insufficient documentation

## 2020-02-04 DIAGNOSIS — N189 Chronic kidney disease, unspecified: Secondary | ICD-10-CM | POA: Insufficient documentation

## 2020-02-04 DIAGNOSIS — N179 Acute kidney failure, unspecified: Secondary | ICD-10-CM | POA: Diagnosis not present

## 2020-02-04 LAB — POCT HEMOGLOBIN-HEMACUE: Hemoglobin: 10.8 g/dL — ABNORMAL LOW (ref 13.0–17.0)

## 2020-02-04 MED ORDER — EPOETIN ALFA-EPBX 10000 UNIT/ML IJ SOLN
INTRAMUSCULAR | Status: AC
  Administered 2020-02-04: 20000 [IU] via SUBCUTANEOUS
  Filled 2020-02-04: qty 2

## 2020-02-04 MED ORDER — EPOETIN ALFA-EPBX 10000 UNIT/ML IJ SOLN: 20000.0000 [IU] | INTRAMUSCULAR | Status: DC

## 2020-02-12 ENCOUNTER — Ambulatory Visit (INDEPENDENT_AMBULATORY_CARE_PROVIDER_SITE_OTHER): Payer: Medicare HMO | Admitting: Internal Medicine

## 2020-02-12 ENCOUNTER — Encounter: Payer: Self-pay | Admitting: Internal Medicine

## 2020-02-12 ENCOUNTER — Other Ambulatory Visit: Payer: Self-pay

## 2020-02-12 VITALS — BP 110/60 | HR 68 | Ht 70.0 in | Wt 248.0 lb

## 2020-02-12 DIAGNOSIS — N184 Chronic kidney disease, stage 4 (severe): Secondary | ICD-10-CM

## 2020-02-12 DIAGNOSIS — E1122 Type 2 diabetes mellitus with diabetic chronic kidney disease: Secondary | ICD-10-CM | POA: Diagnosis not present

## 2020-02-12 DIAGNOSIS — Z8639 Personal history of other endocrine, nutritional and metabolic disease: Secondary | ICD-10-CM

## 2020-02-12 DIAGNOSIS — E890 Postprocedural hypothyroidism: Secondary | ICD-10-CM | POA: Diagnosis not present

## 2020-02-12 DIAGNOSIS — K5190 Ulcerative colitis, unspecified, without complications: Secondary | ICD-10-CM | POA: Diagnosis not present

## 2020-02-12 DIAGNOSIS — N186 End stage renal disease: Secondary | ICD-10-CM | POA: Diagnosis not present

## 2020-02-12 DIAGNOSIS — I5032 Chronic diastolic (congestive) heart failure: Secondary | ICD-10-CM | POA: Diagnosis not present

## 2020-02-12 DIAGNOSIS — E1159 Type 2 diabetes mellitus with other circulatory complications: Secondary | ICD-10-CM | POA: Diagnosis not present

## 2020-02-12 DIAGNOSIS — E1165 Type 2 diabetes mellitus with hyperglycemia: Secondary | ICD-10-CM | POA: Diagnosis not present

## 2020-02-12 LAB — POCT GLYCOSYLATED HEMOGLOBIN (HGB A1C): Hemoglobin A1C: 5.8 % — AB (ref 4.0–5.6)

## 2020-02-12 LAB — TSH: TSH: 99.3 u[IU]/mL — ABNORMAL HIGH (ref 0.35–4.50)

## 2020-02-12 LAB — T4, FREE: Free T4: 0.19 ng/dL — ABNORMAL LOW (ref 0.60–1.60)

## 2020-02-12 NOTE — Patient Instructions (Addendum)
Please continue Levothyroxine 137 mcg daily. ? ?Take the thyroid hormone every day, with water, at least 30 minutes before breakfast, separated by at least 4 hours from: ?- acid reflux medications ?- calcium ?- iron ?- multivitamins ? ?Please stop at the lab. ? ?Please come back for a follow-up appointment in 6 months. ?

## 2020-02-12 NOTE — Progress Notes (Signed)
Patient ID: Edward [name], male   DOB: Dec 24, 1970, 49 y.o.   MRN: [id number]  This visit occurred during the SARS-CoV-2 public health emergency.  Safety protocols were in place, including screening questions prior to the visit, additional usage of staff PPE, and extensive cleaning of exam room while observing appropriate contact time as indicated for disinfecting solutions.   HPI  Edward GLOSS Sr. is a 49 y.o.-year-old male, initially referred by his PCP, Dr. Feliciana Rossetti, returning for f/u for Graves ds, now with post ablative hypothyroidism.  He also has type 2 diabetes (with DR, end-stage renal disease, ED, chronic diastolic heart failure), which is uncontrolled.  Last visit 4 months ago.  Reviewed history: Pt with h/o thyrotoxicosis since at least 2014 and was on methimazole but he was noncompliant over the years with the dosing, therefore, at last visit, I suggested RAI tx.  Since last visit, he had a Thyroid Uptake and Scan (05/24/2017): 1. Elevated 24 hour radioactive iodine uptake equal to 36.9%. 2. Heterogeneous tracer activity within both lobes of the thyroid gland suggesting multinodular goiter.  He had RAI Tx (06/23/2017).  Afterwards, he was noncompliant with his levothyroxine and with his office visit and TSH remains very elevated.  At last visit, he was taking levothyroxine late at night along with calcium.  He is now on levothyroxine 137 mcg daily (dose increased in 12/2019) taken: - may have missed few doses - in am - fasting - at least 30 min from b'fast - no Fe, MVI, PPIs - + calcium 2x a day, 1st dose with lunch - not on Biotin  Reviewed his TFTs: Lab Results  Component Value Date   TSH 72.05 (H) 12/20/2019   TSH 79.400 (H) 10/21/2019   TSH >49.10 (H) 10/22/2018   TSH 86.600 (H) 08/20/2018   TSH <0.01 (L) 08/10/2017   TSH <0.01 (L) 05/12/2017   TSH <0.01 (L) 02/21/2017   TSH 0.04 (L) 09/20/2016   TSH 0.03 (L) 05/31/2016   TSH 0.05 (L) 04/11/2016    FREET4 0.44 (L) 12/20/2019   FREET4 0.41 (L) 10/21/2019   FREET4 0.26 (L) 10/22/2018   FREET4 2.92 (H) 08/10/2017   FREET4 1.69 (H) 05/12/2017   FREET4 1.48 02/21/2017   FREET4 1.50 09/20/2016   FREET4 1.24 05/31/2016   FREET4 1.14 04/11/2016   FREET4 1.97 (H) 02/26/2016    Please TSI's were elevated: Lab Results  Component Value Date   TSI 463 (H) 02/26/2016   Pt denies: - feeling nodules in neck - hoarseness - dysphagia - choking - SOB with lying down  Pt does not have a FH of thyroid ds. No FH of thyroid cancer. No h/o radiation tx to head or neck.  No seaweed or kelp. No recent contrast studies. No herbal supplements. No Biotin use. No recent steroids use.   He had a very high HbA1c before last visit: Lab Results  Component Value Date   HGBA1C 10.8 11/05/2019  He thought that this was an erroneous finding.  He is on (restarted after the above results returned): - Glipizide 2.5 mg before b'fast  He was not checking sugars at last visit. Now once every other day: - am: 90-130  + CKD-end-stage renal disease - sees nephrology: Lab Results  Component Value Date   BUN 60 (H) 08/13/2019   BUN 36 (H) 08/20/2018   BUN 32 (H) 03/21/2018   BUN 43 (H) 03/06/2018   BUN 40 (H) 11/05/2017   BUN 40 (H) 08/31/2017  BUN 39 (H) 07/18/2017   BUN 49 (H) 06/21/2017   BUN 36 (H) 12/19/2016   BUN 36 (H) 10/25/2016   Lab Results  Component Value Date   CREATININE 6.77 (H) 08/13/2019   CREATININE 4.01 (H) 08/20/2018   CREATININE 3.67 (H) 03/21/2018   CREATININE 3.82 (H) 03/06/2018   CREATININE 3.14 (H) 11/05/2017   CREATININE 2.80 (H) 08/31/2017   CREATININE 2.75 (H) 07/18/2017   CREATININE 2.79 (H) 06/21/2017   CREATININE 2.27 (H) 12/19/2016   CREATININE 2.36 (H) 10/25/2016   Last eye exam: 2021. 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  He also has a history of back pain, OA.  He restarted  work.  ROS: Constitutional: no weight gain/no weight loss, no fatigue, no subjective hyperthermia, no subjective hypothermia Eyes: + blurry vision, no xerophthalmia ENT: no sore throat, + see HPI Cardiovascular: no CP/no SOB/no palpitations/no leg swelling Respiratory: no cough/no SOB/no wheezing Gastrointestinal: no N/no V/no D/no C/no acid reflux Musculoskeletal: no muscle aches/no joint aches Skin: no rashes, no hair loss Neurological: no tremors/no numbness/no tingling/no dizziness  I reviewed pt's medications, allergies, PMH, social hx, family hx, and changes were documented in the history of present illness. Otherwise, unchanged from my initial visit note.  Past Medical History:  Diagnosis Date  . Anemia   . Arthritis   . Blind    Left eye  . Cataract   . CHF (congestive heart failure) ([place])   . Chronic kidney disease    stage 5  . Depression    situatuional  . Diabetes mellitus    Type II  . Graves disease 2014  . Headache    in past  . Hx of adenomatous and sessile serrated colonic polyps 11/21/2019  . Hyperlipidemia   . Hypertension   . Hypothyroidism   . Legally blind    right eye  . Neuropathy   . Non-compliance   . Shortness of breath dyspnea    "Better since I've been taking my medications"   Past Surgical History:  Procedure Laterality Date  . AV FISTULA PLACEMENT Right 04/22/2019   Procedure: RIGHT ARM ARTERIOVENOUS (AV) FISTULA CREATION;  Surgeon: Angelia Mould, MD;  Location: [place];  Service: Vascular;  Laterality: Right;  . CIRCUMCISION     as a child, around 49 years of age  . EYE SURGERY Bilateral    "several "  . PARS PLANA VITRECTOMY Right 03/25/2016   Procedure: PARS PLANA VITRECTOMY 25 GAUGE FOR ENDOPHTHALMITIS;  Surgeon: Jalene Mullet, MD;  Location: [place];  Service: Ophthalmology;  Laterality: Right;  . WISDOM TOOTH EXTRACTION     Social History   Social History  . Marital Status: Married    Spouse Name: N/A  . Number of  Children: 5: 49-4 y/o (2017)   Occupational History  . Chef-cook   Social History Main Topics  . Smoking status: Former Smoker -- 0.75 packs/day for 3 years    Types: Cigarettes    Quit date: 2004  . Smokeless tobacco: Not on file  . Alcohol Use:      Comment: 1 beer every night  . Drug Use: No   Current Outpatient Medications on File Prior to Visit  Medication Sig Dispense Refill  . acetaminophen (TYLENOL) 500 MG tablet Take 500 mg by mouth every 6 (six) hours as needed.    . Alcohol Swabs (B-D SINGLE USE SWABS REGULAR) PADS Use as directed. 100  each 6  . atorvastatin (LIPITOR) 40 MG tablet Take 1 tablet (40 mg total) by mouth daily. 30 tablet 6  . Blood Glucose Monitoring Suppl (ACCU-CHEK GUIDE ME) w/Device KIT Use 3 times daily before meals 1 kit 0  . calcitRIOL (ROCALTROL) 0.5 MCG capsule Take by mouth daily. Taking 5 capsules a Day    . diclofenac sodium (VOLTAREN) 1 % GEL Apply 2 g topically 3 (three) times daily as needed (arthritic pain).    Marland Kitchen glipiZIDE (GLUCOTROL) 5 MG tablet Take 0.5 tablets (2.5 mg total) by mouth daily. Hold for low blood glucose 15 tablet 6  . glucose blood (ACCU-CHEK GUIDE) test strip Use as instructed 100 each 12  . glycopyrrolate (ROBINUL) 2 MG tablet Take 1 tablet (2 mg total) by mouth 2 (two) times daily. 180 tablet 3  . hydrALAZINE (APRESOLINE) 50 MG tablet Take 1 tablet (50 mg total) by mouth 2 (two) times daily. 60 tablet 3  . levothyroxine (SYNTHROID) 137 MCG tablet Take 1 tablet (137 mcg total) by mouth daily before breakfast. 90 tablet 1  . sildenafil (VIAGRA) 50 MG tablet Take 2 tablets (100 mg total) by mouth daily as needed for erectile dysfunction. At least 24 hours between doses. 10 tablet 1  . torsemide (DEMADEX) 100 MG tablet Take 100 mg by mouth 2 (two) times daily.   6  . TRUEplus Lancets 33G MISC Use as instructed to check blood sugar daily. 100 each 6  . carvedilol (COREG) 3.125 MG tablet Take 1 tablet (3.125 mg total) by mouth 2 (two)  times daily. 90 tablet 0   No current facility-administered medications on file prior to visit.   No Known Allergies Family History  Problem Relation Age of Onset  . Hypertension Mother   . Diabetes Mother   . Bladder Cancer Brother   . Kidney disease Other   . Colon cancer Neg Hx   . Rectal cancer Neg Hx    PE: BP 110/60   Pulse 68   Ht 5' 10"  (1.778 m)   Wt 248 lb (112.5 kg)   SpO2 98%   BMI 35.58 kg/m   Wt Readings from Last 3 Encounters:  02/12/20 248 lb (112.5 kg)  12/25/19 250 lb (113.4 kg)  12/05/19 251 lb 4 oz (114 kg)   Constitutional: overweight, in NAD Eyes: PERRLA, EOMI, + exophthalmos ENT: moist mucous membranes, no thyromegaly no, no cervical lymphadenopathy Cardiovascular: RRR, No MRG, + bilateral LE edema Respiratory: CTA B Gastrointestinal: abdomen soft, NT, ND, BS+ Musculoskeletal: no deformities, strength intact in all 4 Skin: moist, warm, no rashes Neurological: no tremor with outstretched hands, DTR normal in all 4  ASSESSMENT: 1. Postablative hypothyroidism  2. H/o Graves ds - thyrotoxicosis since at least 2014  3.  Type 2 diabetes, uncontrolled, with complications - dCHF - ESRD - DR - ED  PLAN:  1.  Patient with post ablative hypothyroidism developed after RAI treatment for Graves' disease.  At last visit he returned after a long absence, and was noncompliant with his medications.  The TSH level was very high.  Also, he was taking levothyroxine along with calcium and taking it at night.  We moved levothyroxine the morning and separated from calcium.  However a TSH was checked again 2 months ago and it was still quite high.  At that time, we increase his levothyroxine dose. - latest thyroid labs reviewed with pt >> very high: Lab Results  Component Value Date   TSH 72.05 (H) 12/20/2019   -  he continues on LT4 137 mcg daily - pt feels good on this dose. - we discussed about taking the thyroid hormone every day, with water, >30 minutes  before breakfast, separated by >4 hours from acid reflux medications, calcium, iron, multivitamins. Pt. is taking it correctly, but he may have missed few doses since last OV.  Discussed about the importance of taking it every single day. - will check thyroid tests today: TSH and fT4 - If labs are abnormal, he will need to return for repeat TFTs in 1.5 months  2.  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  Type 2 diabetes -Uncontrolled, based on the last HbA1c of 10.8%.  However, at that time, he felt that this was an error. -At last visit he was off glipizide and this was restarted after the latest HbA1c.  He is taking a very low-dose of glipizide. -At today's visit, HbA1c is 5.8%, MUCH improved.  -Continue to check sugars every day or every other day -No need to change his regimen for now.  Office Visit on 02/12/2020  Component Date Value Ref Range Status  . Hemoglobin A1C 02/12/2020 5.8* 4.0 - 5.6 % Final   Component     Latest Ref Rng & Units 02/12/2020  TSH     0.35 - 4.50 uIU/mL 99.30 (H)  T4,Free(Direct)     0.60 - 1.60 ng/dL 0.19 (L)   TSH even higher, it appears the patient is not taking levothyroxine at all.  We will check with him to make sure he has this at home and again advised him to take it every day.  I will have him back for a recheck in 1.5 months.  Philemon Kingdom, MD PhD [facility]

## 2020-02-14 ENCOUNTER — Telehealth: Payer: Self-pay

## 2020-02-14 DIAGNOSIS — Z20822 Contact with and (suspected) exposure to covid-19: Secondary | ICD-10-CM | POA: Diagnosis not present

## 2020-02-14 DIAGNOSIS — Z03818 Encounter for observation for suspected exposure to other biological agents ruled out: Secondary | ICD-10-CM | POA: Diagnosis not present

## 2020-02-14 NOTE — Telephone Encounter (Signed)
Left message for patient to return our call at [phone].  

## 2020-02-14 NOTE — Telephone Encounter (Signed)
-----   Message from Philemon Kingdom, MD sent at 02/13/2020  5:25 PM EDT ----- Lenna Sciara, can you please call pt: His TSH is even higher than before.  It actually does not look that he is taking ANY levothyroxine.  Please check with him if he even has it at home (please wait on the phone until he is checking his medications to make sure he has it).  He absolutely needs to start taking it every single day.  We will have him back for labs in 1.5 months.  If the TSH is not improving, I will assume that he is not taking it and in this situation, I cannot help him further and may need to discharge him, since his situation has not improved in the last 1.5 years.

## 2020-02-17 NOTE — Telephone Encounter (Signed)
Notified patient of message from Dr. Cruzita Lederer, patient expressed understanding and agreement. No further questions.  He will work on taking every day.

## 2020-02-18 ENCOUNTER — Encounter (HOSPITAL_COMMUNITY)
Admission: RE | Admit: 2020-02-18 | Discharge: 2020-02-18 | Disposition: A | Discharge status: Home / Self Care | Payer: Medicare HMO | Source: Ambulatory Visit | Attending: Nephrology | Admitting: Nephrology

## 2020-02-18 ENCOUNTER — Other Ambulatory Visit: Payer: Self-pay

## 2020-02-18 VITALS — BP 164/92 | HR 81 | Temp 96.0°F | Resp 18

## 2020-02-18 DIAGNOSIS — N184 Chronic kidney disease, stage 4 (severe): Secondary | ICD-10-CM

## 2020-02-18 DIAGNOSIS — N179 Acute kidney failure, unspecified: Secondary | ICD-10-CM | POA: Diagnosis not present

## 2020-02-18 DIAGNOSIS — N189 Chronic kidney disease, unspecified: Secondary | ICD-10-CM | POA: Insufficient documentation

## 2020-02-18 LAB — IRON AND TIBC
Iron: 98 ug/dL (ref 45–182)
Saturation Ratios: 31 % (ref 17.9–39.5)
TIBC: 321 ug/dL (ref 250–450)
UIBC: 223 ug/dL

## 2020-02-18 LAB — FERRITIN: Ferritin: 460 ng/mL — ABNORMAL HIGH (ref 24–336)

## 2020-02-18 LAB — POCT HEMOGLOBIN-HEMACUE: Hemoglobin: 12.1 g/dL — ABNORMAL LOW (ref 13.0–17.0)

## 2020-02-18 MED ORDER — EPOETIN ALFA-EPBX 10000 UNIT/ML IJ SOLN
INTRAMUSCULAR | Status: AC
  Filled 2020-02-18: qty 2

## 2020-02-18 MED ORDER — EPOETIN ALFA-EPBX 10000 UNIT/ML IJ SOLN: 20000.0000 [IU] | INTRAMUSCULAR | Status: DC

## 2020-03-03 ENCOUNTER — Other Ambulatory Visit: Payer: Self-pay

## 2020-03-03 ENCOUNTER — Encounter (HOSPITAL_COMMUNITY)
Admission: RE | Admit: 2020-03-03 | Discharge: 2020-03-03 | Disposition: A | Discharge status: Home / Self Care | Payer: Medicare HMO | Source: Ambulatory Visit | Attending: Nephrology | Admitting: Nephrology

## 2020-03-03 VITALS — BP 138/86 | HR 82 | Temp 96.3°F | Resp 18

## 2020-03-03 DIAGNOSIS — N189 Chronic kidney disease, unspecified: Secondary | ICD-10-CM | POA: Diagnosis not present

## 2020-03-03 DIAGNOSIS — N184 Chronic kidney disease, stage 4 (severe): Secondary | ICD-10-CM | POA: Insufficient documentation

## 2020-03-03 DIAGNOSIS — N179 Acute kidney failure, unspecified: Secondary | ICD-10-CM | POA: Diagnosis not present

## 2020-03-03 LAB — POCT HEMOGLOBIN-HEMACUE: Hemoglobin: 11 g/dL — ABNORMAL LOW (ref 13.0–17.0)

## 2020-03-03 MED ORDER — EPOETIN ALFA-EPBX 10000 UNIT/ML IJ SOLN
INTRAMUSCULAR | Status: AC
  Filled 2020-03-03: qty 2

## 2020-03-03 MED ORDER — EPOETIN ALFA-EPBX 10000 UNIT/ML IJ SOLN
20000.0000 [IU] | INTRAMUSCULAR | Status: DC
  Administered 2020-03-03: 20000 [IU] via SUBCUTANEOUS

## 2020-03-17 ENCOUNTER — Other Ambulatory Visit: Payer: Self-pay

## 2020-03-17 ENCOUNTER — Ambulatory Visit (HOSPITAL_COMMUNITY)
Admission: RE | Admit: 2020-03-17 | Discharge: 2020-03-17 | Disposition: A | Discharge status: Home / Self Care | Payer: Medicare HMO | Source: Ambulatory Visit | Attending: Nephrology | Admitting: Nephrology

## 2020-03-17 VITALS — BP 150/90 | HR 84 | Temp 96.6°F | Ht 72.0 in | Wt 250.0 lb

## 2020-03-17 DIAGNOSIS — N179 Acute kidney failure, unspecified: Secondary | ICD-10-CM

## 2020-03-17 DIAGNOSIS — N184 Chronic kidney disease, stage 4 (severe): Secondary | ICD-10-CM | POA: Insufficient documentation

## 2020-03-17 DIAGNOSIS — N189 Chronic kidney disease, unspecified: Secondary | ICD-10-CM | POA: Diagnosis not present

## 2020-03-17 LAB — IRON AND TIBC
Iron: 79 ug/dL (ref 45–182)
Saturation Ratios: 26 % (ref 17.9–39.5)
TIBC: 302 ug/dL (ref 250–450)
UIBC: 223 ug/dL

## 2020-03-17 LAB — FERRITIN: Ferritin: 392 ng/mL — ABNORMAL HIGH (ref 24–336)

## 2020-03-17 LAB — POCT HEMOGLOBIN-HEMACUE: Hemoglobin: 11 g/dL — ABNORMAL LOW (ref 13.0–17.0)

## 2020-03-17 MED ORDER — EPOETIN ALFA-EPBX 10000 UNIT/ML IJ SOLN
INTRAMUSCULAR | Status: AC
  Filled 2020-03-17: qty 2

## 2020-03-17 MED ORDER — EPOETIN ALFA-EPBX 10000 UNIT/ML IJ SOLN
20000.0000 [IU] | INTRAMUSCULAR | Status: DC
  Administered 2020-03-17: 20000 [IU] via SUBCUTANEOUS

## 2020-03-31 ENCOUNTER — Other Ambulatory Visit: Payer: Self-pay

## 2020-03-31 ENCOUNTER — Ambulatory Visit (HOSPITAL_COMMUNITY)
Admission: RE | Admit: 2020-03-31 | Discharge: 2020-03-31 | Disposition: A | Discharge status: Home / Self Care | Payer: Medicare HMO | Source: Ambulatory Visit | Attending: Nephrology | Admitting: Nephrology

## 2020-03-31 VITALS — BP 160/96 | HR 80 | Resp 18

## 2020-03-31 DIAGNOSIS — N189 Chronic kidney disease, unspecified: Secondary | ICD-10-CM | POA: Diagnosis not present

## 2020-03-31 DIAGNOSIS — N179 Acute kidney failure, unspecified: Secondary | ICD-10-CM | POA: Insufficient documentation

## 2020-03-31 DIAGNOSIS — N184 Chronic kidney disease, stage 4 (severe): Secondary | ICD-10-CM | POA: Diagnosis not present

## 2020-03-31 LAB — POCT HEMOGLOBIN-HEMACUE: Hemoglobin: 10.9 g/dL — ABNORMAL LOW (ref 13.0–17.0)

## 2020-03-31 MED ORDER — EPOETIN ALFA-EPBX 10000 UNIT/ML IJ SOLN
20000.0000 [IU] | INTRAMUSCULAR | Status: DC
  Administered 2020-03-31: 20000 [IU] via SUBCUTANEOUS

## 2020-03-31 MED ORDER — EPOETIN ALFA-EPBX 10000 UNIT/ML IJ SOLN
INTRAMUSCULAR | Status: AC
  Filled 2020-03-31: qty 2

## 2020-04-05 ENCOUNTER — Other Ambulatory Visit: Payer: Self-pay | Admitting: Internal Medicine

## 2020-04-14 ENCOUNTER — Ambulatory Visit (HOSPITAL_COMMUNITY)
Admission: RE | Admit: 2020-04-14 | Discharge: 2020-04-14 | Disposition: A | Discharge status: Home / Self Care | Payer: Medicare HMO | Source: Ambulatory Visit | Attending: Nephrology | Admitting: Nephrology

## 2020-04-14 ENCOUNTER — Other Ambulatory Visit: Payer: Self-pay

## 2020-04-14 VITALS — BP 154/91 | HR 77 | Temp 97.6°F | Resp 18

## 2020-04-14 DIAGNOSIS — N184 Chronic kidney disease, stage 4 (severe): Secondary | ICD-10-CM | POA: Insufficient documentation

## 2020-04-14 DIAGNOSIS — N189 Chronic kidney disease, unspecified: Secondary | ICD-10-CM | POA: Diagnosis not present

## 2020-04-14 DIAGNOSIS — N179 Acute kidney failure, unspecified: Secondary | ICD-10-CM | POA: Diagnosis not present

## 2020-04-14 LAB — IRON AND TIBC
Iron: 50 ug/dL (ref 45–182)
Saturation Ratios: 17 % — ABNORMAL LOW (ref 17.9–39.5)
TIBC: 295 ug/dL (ref 250–450)
UIBC: 245 ug/dL

## 2020-04-14 LAB — POCT HEMOGLOBIN-HEMACUE: Hemoglobin: 11.2 g/dL — ABNORMAL LOW (ref 13.0–17.0)

## 2020-04-14 LAB — FERRITIN: Ferritin: 393 ng/mL — ABNORMAL HIGH (ref 24–336)

## 2020-04-14 MED ORDER — EPOETIN ALFA-EPBX 10000 UNIT/ML IJ SOLN: 20000.0000 [IU] | INTRAMUSCULAR | Status: DC

## 2020-04-14 MED ORDER — EPOETIN ALFA-EPBX 10000 UNIT/ML IJ SOLN
INTRAMUSCULAR | Status: AC
  Administered 2020-04-14: 20000 [IU] via SUBCUTANEOUS
  Filled 2020-04-14: qty 2

## 2020-04-28 ENCOUNTER — Other Ambulatory Visit: Payer: Self-pay

## 2020-04-28 ENCOUNTER — Encounter (HOSPITAL_COMMUNITY)
Admission: RE | Admit: 2020-04-28 | Discharge: 2020-04-28 | Disposition: A | Discharge status: Home / Self Care | Payer: Medicare HMO | Source: Ambulatory Visit | Attending: Nephrology | Admitting: Nephrology

## 2020-04-28 VITALS — BP 165/97 | HR 80 | Temp 97.1°F | Resp 18

## 2020-04-28 DIAGNOSIS — N179 Acute kidney failure, unspecified: Secondary | ICD-10-CM | POA: Diagnosis not present

## 2020-04-28 DIAGNOSIS — N189 Chronic kidney disease, unspecified: Secondary | ICD-10-CM | POA: Insufficient documentation

## 2020-04-28 DIAGNOSIS — N184 Chronic kidney disease, stage 4 (severe): Secondary | ICD-10-CM | POA: Diagnosis not present

## 2020-04-28 LAB — POCT HEMOGLOBIN-HEMACUE: Hemoglobin: 11 g/dL — ABNORMAL LOW (ref 13.0–17.0)

## 2020-04-28 MED ORDER — EPOETIN ALFA-EPBX 10000 UNIT/ML IJ SOLN: 20000.0000 [IU] | INTRAMUSCULAR | Status: DC

## 2020-04-28 MED ORDER — EPOETIN ALFA-EPBX 10000 UNIT/ML IJ SOLN
INTRAMUSCULAR | Status: AC
  Administered 2020-04-28: 20000 [IU] via SUBCUTANEOUS
  Filled 2020-04-28: qty 2

## 2020-05-12 ENCOUNTER — Encounter (HOSPITAL_COMMUNITY): Payer: Medicare HMO

## 2020-05-23 ENCOUNTER — Inpatient Hospital Stay (HOSPITAL_COMMUNITY)
Admission: EM | Admit: 2020-05-23 | Discharge: 2020-05-29 | DRG: 177 | Disposition: A | Discharge status: Home Health | Payer: Medicare PPO | Attending: Family Medicine | Admitting: Family Medicine

## 2020-05-23 ENCOUNTER — Other Ambulatory Visit: Payer: Self-pay

## 2020-05-23 ENCOUNTER — Encounter (HOSPITAL_COMMUNITY): Payer: Self-pay | Admitting: *Deleted

## 2020-05-23 ENCOUNTER — Emergency Department (HOSPITAL_COMMUNITY): Payer: Medicare PPO

## 2020-05-23 DIAGNOSIS — E875 Hyperkalemia: Secondary | ICD-10-CM | POA: Diagnosis not present

## 2020-05-23 DIAGNOSIS — J1282 Pneumonia due to coronavirus disease 2019: Secondary | ICD-10-CM | POA: Diagnosis present

## 2020-05-23 DIAGNOSIS — Z79890 Hormone replacement therapy (postmenopausal): Secondary | ICD-10-CM

## 2020-05-23 DIAGNOSIS — J9601 Acute respiratory failure with hypoxia: Secondary | ICD-10-CM | POA: Diagnosis present

## 2020-05-23 DIAGNOSIS — Z8719 Personal history of other diseases of the digestive system: Secondary | ICD-10-CM

## 2020-05-23 DIAGNOSIS — E8809 Other disorders of plasma-protein metabolism, not elsewhere classified: Secondary | ICD-10-CM | POA: Diagnosis present

## 2020-05-23 DIAGNOSIS — J80 Acute respiratory distress syndrome: Secondary | ICD-10-CM | POA: Diagnosis not present

## 2020-05-23 DIAGNOSIS — E785 Hyperlipidemia, unspecified: Secondary | ICD-10-CM | POA: Diagnosis present

## 2020-05-23 DIAGNOSIS — I152 Hypertension secondary to endocrine disorders: Secondary | ICD-10-CM | POA: Diagnosis present

## 2020-05-23 DIAGNOSIS — N179 Acute kidney failure, unspecified: Secondary | ICD-10-CM | POA: Diagnosis present

## 2020-05-23 DIAGNOSIS — E1165 Type 2 diabetes mellitus with hyperglycemia: Secondary | ICD-10-CM | POA: Diagnosis present

## 2020-05-23 DIAGNOSIS — F4321 Adjustment disorder with depressed mood: Secondary | ICD-10-CM | POA: Diagnosis present

## 2020-05-23 DIAGNOSIS — R410 Disorientation, unspecified: Secondary | ICD-10-CM | POA: Diagnosis not present

## 2020-05-23 DIAGNOSIS — H548 Legal blindness, as defined in USA: Secondary | ICD-10-CM | POA: Diagnosis present

## 2020-05-23 DIAGNOSIS — D631 Anemia in chronic kidney disease: Secondary | ICD-10-CM | POA: Diagnosis present

## 2020-05-23 DIAGNOSIS — U071 COVID-19: Principal | ICD-10-CM | POA: Diagnosis present

## 2020-05-23 DIAGNOSIS — T381X6A Underdosing of thyroid hormones and substitutes, initial encounter: Secondary | ICD-10-CM | POA: Diagnosis present

## 2020-05-23 DIAGNOSIS — E11319 Type 2 diabetes mellitus with unspecified diabetic retinopathy without macular edema: Secondary | ICD-10-CM | POA: Diagnosis present

## 2020-05-23 DIAGNOSIS — E890 Postprocedural hypothyroidism: Secondary | ICD-10-CM | POA: Diagnosis present

## 2020-05-23 DIAGNOSIS — Z87891 Personal history of nicotine dependence: Secondary | ICD-10-CM

## 2020-05-23 DIAGNOSIS — N186 End stage renal disease: Secondary | ICD-10-CM | POA: Diagnosis present

## 2020-05-23 DIAGNOSIS — Z79899 Other long term (current) drug therapy: Secondary | ICD-10-CM

## 2020-05-23 DIAGNOSIS — Z992 Dependence on renal dialysis: Secondary | ICD-10-CM

## 2020-05-23 DIAGNOSIS — R404 Transient alteration of awareness: Secondary | ICD-10-CM | POA: Diagnosis not present

## 2020-05-23 DIAGNOSIS — I5032 Chronic diastolic (congestive) heart failure: Secondary | ICD-10-CM | POA: Diagnosis present

## 2020-05-23 DIAGNOSIS — I120 Hypertensive chronic kidney disease with stage 5 chronic kidney disease or end stage renal disease: Secondary | ICD-10-CM | POA: Diagnosis not present

## 2020-05-23 DIAGNOSIS — E872 Acidosis: Secondary | ICD-10-CM | POA: Diagnosis present

## 2020-05-23 DIAGNOSIS — I313 Pericardial effusion (noninflammatory): Secondary | ICD-10-CM

## 2020-05-23 DIAGNOSIS — N185 Chronic kidney disease, stage 5: Secondary | ICD-10-CM | POA: Diagnosis present

## 2020-05-23 DIAGNOSIS — R809 Proteinuria, unspecified: Secondary | ICD-10-CM | POA: Diagnosis present

## 2020-05-23 DIAGNOSIS — Z833 Family history of diabetes mellitus: Secondary | ICD-10-CM

## 2020-05-23 DIAGNOSIS — E1169 Type 2 diabetes mellitus with other specified complication: Secondary | ICD-10-CM | POA: Diagnosis present

## 2020-05-23 DIAGNOSIS — Z8249 Family history of ischemic heart disease and other diseases of the circulatory system: Secondary | ICD-10-CM

## 2020-05-23 DIAGNOSIS — R0902 Hypoxemia: Secondary | ICD-10-CM | POA: Diagnosis not present

## 2020-05-23 DIAGNOSIS — R069 Unspecified abnormalities of breathing: Secondary | ICD-10-CM | POA: Diagnosis not present

## 2020-05-23 DIAGNOSIS — G9341 Metabolic encephalopathy: Secondary | ICD-10-CM | POA: Diagnosis present

## 2020-05-23 DIAGNOSIS — Z91138 Patient's unintentional underdosing of medication regimen for other reason: Secondary | ICD-10-CM

## 2020-05-23 DIAGNOSIS — R0602 Shortness of breath: Secondary | ICD-10-CM | POA: Diagnosis not present

## 2020-05-23 DIAGNOSIS — Z7984 Long term (current) use of oral hypoglycemic drugs: Secondary | ICD-10-CM

## 2020-05-23 DIAGNOSIS — R4182 Altered mental status, unspecified: Secondary | ICD-10-CM | POA: Diagnosis not present

## 2020-05-23 DIAGNOSIS — E878 Other disorders of electrolyte and fluid balance, not elsewhere classified: Secondary | ICD-10-CM | POA: Diagnosis present

## 2020-05-23 DIAGNOSIS — E1122 Type 2 diabetes mellitus with diabetic chronic kidney disease: Secondary | ICD-10-CM | POA: Diagnosis present

## 2020-05-23 DIAGNOSIS — R000 Tachycardia, unspecified: Secondary | ICD-10-CM | POA: Diagnosis not present

## 2020-05-23 DIAGNOSIS — N2581 Secondary hyperparathyroidism of renal origin: Secondary | ICD-10-CM | POA: Diagnosis present

## 2020-05-23 DIAGNOSIS — E1139 Type 2 diabetes mellitus with other diabetic ophthalmic complication: Secondary | ICD-10-CM | POA: Diagnosis not present

## 2020-05-23 DIAGNOSIS — Z8052 Family history of malignant neoplasm of bladder: Secondary | ICD-10-CM

## 2020-05-23 DIAGNOSIS — E860 Dehydration: Secondary | ICD-10-CM | POA: Diagnosis present

## 2020-05-23 DIAGNOSIS — Z91199 Patient's noncompliance with other medical treatment and regimen due to unspecified reason: Secondary | ICD-10-CM

## 2020-05-23 DIAGNOSIS — I132 Hypertensive heart and chronic kidney disease with heart failure and with stage 5 chronic kidney disease, or end stage renal disease: Secondary | ICD-10-CM | POA: Diagnosis not present

## 2020-05-23 DIAGNOSIS — J9621 Acute and chronic respiratory failure with hypoxia: Secondary | ICD-10-CM | POA: Diagnosis not present

## 2020-05-23 LAB — CBC WITH DIFFERENTIAL/PLATELET
Abs Immature Granulocytes: 0 10*3/uL (ref 0.00–0.07)
Band Neutrophils: 3 %
Basophils Absolute: 0 10*3/uL (ref 0.0–0.1)
Basophils Relative: 0 %
Eosinophils Absolute: 0 10*3/uL (ref 0.0–0.5)
Eosinophils Relative: 0 %
HCT: 38.3 % — ABNORMAL LOW (ref 39.0–52.0)
Hemoglobin: 11.9 g/dL — ABNORMAL LOW (ref 13.0–17.0)
Lymphocytes Relative: 11 %
Lymphs Abs: 0.9 10*3/uL (ref 0.7–4.0)
MCH: 31.5 pg (ref 26.0–34.0)
MCHC: 31.1 g/dL (ref 30.0–36.0)
MCV: 101.3 fL — ABNORMAL HIGH (ref 80.0–100.0)
Monocytes Absolute: 0.1 10*3/uL (ref 0.1–1.0)
Monocytes Relative: 1 %
Neutro Abs: 7 10*3/uL (ref 1.7–7.7)
Neutrophils Relative %: 85 %
Platelets: 268 10*3/uL (ref 150–400)
RBC: 3.78 MIL/uL — ABNORMAL LOW (ref 4.22–5.81)
RDW: 15.8 % — ABNORMAL HIGH (ref 11.5–15.5)
WBC: 7.9 10*3/uL (ref 4.0–10.5)
nRBC: 0.4 % — ABNORMAL HIGH (ref 0.0–0.2)

## 2020-05-23 LAB — COMPREHENSIVE METABOLIC PANEL
ALT: 34 U/L (ref 0–44)
AST: 60 U/L — ABNORMAL HIGH (ref 15–41)
Albumin: 3 g/dL — ABNORMAL LOW (ref 3.5–5.0)
Alkaline Phosphatase: 57 U/L (ref 38–126)
Anion gap: 17 — ABNORMAL HIGH (ref 5–15)
BUN: 96 mg/dL — ABNORMAL HIGH (ref 6–20)
CO2: 15 mmol/L — ABNORMAL LOW (ref 22–32)
Calcium: 8.8 mg/dL — ABNORMAL LOW (ref 8.9–10.3)
Chloride: 108 mmol/L (ref 98–111)
Creatinine, Ser: 9.05 mg/dL — ABNORMAL HIGH (ref 0.61–1.24)
GFR calc Af Amer: 7 mL/min — ABNORMAL LOW (ref >60)
GFR calc non Af Amer: 6 mL/min — ABNORMAL LOW (ref >60)
Glucose, Bld: 175 mg/dL — ABNORMAL HIGH (ref 70–99)
Potassium: 5.1 mmol/L (ref 3.5–5.1)
Sodium: 140 mmol/L (ref 135–145)
Total Bilirubin: 0.9 mg/dL (ref 0.3–1.2)
Total Protein: 7.5 g/dL (ref 6.5–8.1)

## 2020-05-23 LAB — I-STAT VENOUS BLOOD GAS, ED
Acid-base deficit: 8 mmol/L — ABNORMAL HIGH (ref 0.0–2.0)
Bicarbonate: 15 mmol/L — ABNORMAL LOW (ref 20.0–28.0)
Calcium, Ion: 1.06 mmol/L — ABNORMAL LOW (ref 1.15–1.40)
HCT: 32 % — ABNORMAL LOW (ref 39.0–52.0)
Hemoglobin: 10.9 g/dL — ABNORMAL LOW (ref 13.0–17.0)
O2 Saturation: 91 %
Potassium: 4.6 mmol/L (ref 3.5–5.1)
Sodium: 139 mmol/L (ref 135–145)
TCO2: 16 mmol/L — ABNORMAL LOW (ref 22–32)
pCO2, Ven: 24.1 mmHg — ABNORMAL LOW (ref 44.0–60.0)
pH, Ven: 7.402 (ref 7.250–7.430)
pO2, Ven: 59 mmHg — ABNORMAL HIGH (ref 32.0–45.0)

## 2020-05-23 LAB — CBG MONITORING, ED: Glucose-Capillary: 171 mg/dL — ABNORMAL HIGH (ref 70–99)

## 2020-05-23 LAB — SARS CORONAVIRUS 2 BY RT PCR (HOSPITAL ORDER, PERFORMED IN ~~LOC~~ HOSPITAL LAB): SARS Coronavirus 2: POSITIVE — AB

## 2020-05-23 LAB — LACTIC ACID, PLASMA
Lactic Acid, Venous: 2.6 mmol/L (ref 0.5–1.9)
Lactic Acid, Venous: 1.6 mmol/L (ref 0.5–1.9)

## 2020-05-23 LAB — PROTIME-INR
INR: 1.1 (ref 0.8–1.2)
Prothrombin Time: 13.9 seconds (ref 11.4–15.2)

## 2020-05-23 LAB — TSH: TSH: 35.088 u[IU]/mL — ABNORMAL HIGH (ref 0.350–4.500)

## 2020-05-23 MED ORDER — INSULIN ASPART 100 UNIT/ML ~~LOC~~ SOLN
0.0000 [IU] | Freq: Three times a day (TID) | SUBCUTANEOUS | Status: DC
  Administered 2020-05-24: 1 [IU] via SUBCUTANEOUS
  Administered 2020-05-24 (×2): 2 [IU] via SUBCUTANEOUS
  Administered 2020-05-25: 3 [IU] via SUBCUTANEOUS
  Administered 2020-05-25: 2 [IU] via SUBCUTANEOUS
  Administered 2020-05-25: 3 [IU] via SUBCUTANEOUS
  Administered 2020-05-26: 4 [IU] via SUBCUTANEOUS
  Administered 2020-05-26: 1 [IU] via SUBCUTANEOUS
  Administered 2020-05-26 – 2020-05-27 (×2): 2 [IU] via SUBCUTANEOUS
  Administered 2020-05-28: 1 [IU] via SUBCUTANEOUS
  Administered 2020-05-28 (×2): 2 [IU] via SUBCUTANEOUS
  Administered 2020-05-29 (×2): 1 [IU] via SUBCUTANEOUS

## 2020-05-23 MED ORDER — ACETAMINOPHEN 325 MG PO TABS
650.0000 mg | ORAL_TABLET | Freq: Once | ORAL | Status: AC
  Administered 2020-05-23: 650 mg via ORAL
  Filled 2020-05-23: qty 2

## 2020-05-23 MED ORDER — SODIUM CHLORIDE 0.9 % IV SOLN: INTRAVENOUS | Status: AC

## 2020-05-23 MED ORDER — HEPARIN SODIUM (PORCINE) 5000 UNIT/ML IJ SOLN
5000.0000 [IU] | Freq: Three times a day (TID) | INTRAMUSCULAR | Status: DC
  Administered 2020-05-23 – 2020-05-29 (×17): 5000 [IU] via SUBCUTANEOUS
  Filled 2020-05-23 (×18): qty 1

## 2020-05-23 MED ORDER — ACETAMINOPHEN 325 MG PO TABS: 650.0000 mg | ORAL_TABLET | Freq: Four times a day (QID) | ORAL | Status: DC | PRN

## 2020-05-23 MED ORDER — SODIUM CHLORIDE 0.9 % IV SOLN
100.0000 mg | Freq: Every day | INTRAVENOUS | Status: AC
  Administered 2020-05-24 – 2020-05-27 (×4): 100 mg via INTRAVENOUS
  Filled 2020-05-23 (×4): qty 20

## 2020-05-23 MED ORDER — SODIUM CHLORIDE 0.9 % IV SOLN
200.0000 mg | Freq: Once | INTRAVENOUS | Status: AC
  Administered 2020-05-23: 200 mg via INTRAVENOUS
  Filled 2020-05-23: qty 40

## 2020-05-23 MED ORDER — METHYLPREDNISOLONE SODIUM SUCC 125 MG IJ SOLR
0.5000 mg/kg | Freq: Two times a day (BID) | INTRAMUSCULAR | Status: DC
  Administered 2020-05-23: 56.875 mg via INTRAVENOUS
  Filled 2020-05-23: qty 2

## 2020-05-23 MED ORDER — LEVOTHYROXINE SODIUM 25 MCG PO TABS
137.0000 ug | ORAL_TABLET | Freq: Every day | ORAL | Status: DC
  Administered 2020-05-24 – 2020-05-29 (×6): 137 ug via ORAL
  Filled 2020-05-23 (×6): qty 1

## 2020-05-23 NOTE — ED Notes (Signed)
Edward [name] bird- Sister- [phone]- would like pt updates

## 2020-05-23 NOTE — ED Notes (Signed)
Edward [name] [phone] please call with an update on the pt

## 2020-05-23 NOTE — ED Notes (Signed)
Bertram Millard (Sister) - [phone] given update.

## 2020-05-23 NOTE — ED Provider Notes (Addendum)
[facility] EMERGENCY DEPARTMENT Provider Note   CSN: [id number] Arrival date & time: 05/23/20  1601     History Chief Complaint  Patient presents with  . Covid/AMS    Edward [name] Sr. is a 49 y.o. male.  HPI  LEVEL 5 CAVEAT 2/43 to AMS  49 year old male with extensive medical history including CHF with an EF of 50 to 55% as of December 2019, CKD with recent fistula placement but no dialysis initiated yet, hyperlipidemia, hypertension, hypothyroid, legally blind, depression presents to the ER via EMS found to be hypoxic with a 78% and confused. Allegedly tested positive for Covid 2 weeks ago. History is limited as the patient is confused, slow to respond. Alert to person, is aware that he is in the hospital but cannot tell me which one. Does not know what year it is. Does not seem to be fully comprehending contents of questions. Alert, mildly tachypneic.    Past Medical History:  Diagnosis Date  . Anemia   . Arthritis   . Blind    Left eye  . Cataract   . CHF (congestive heart failure) ([place])   . Chronic kidney disease    stage 5  . Depression    situatuional  . Diabetes mellitus    Type II  . Graves disease 2014  . Headache    in past  . Hx of adenomatous and sessile serrated colonic polyps 11/21/2019  . Hyperlipidemia   . Hypertension   . Hypothyroidism   . Legally blind    right eye  . Neuropathy   . Non-compliance   . Shortness of breath dyspnea    "Better since I've been taking my medications"    Patient Active Problem List   Diagnosis Date Noted  . Hx of adenomatous and sessile serrated colonic polyps 11/21/2019  . Chronic diastolic heart failure ([place]) 10/24/2018  . 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 ED (erectile dysfunction) 08/10/2017  . Chronic kidney disease 07/18/2017  . Arthralgia 07/18/2017  . Arthritis 07/18/2017  . Graves disease 03/02/2016  . Non compliance with medical treatment 11/26/2015  . Uncontrolled  hypertension 11/26/2015  . Acute kidney injury superimposed on chronic kidney disease ([place]) 11/25/2015  . Hyperkalemia 11/25/2015  . Diabetes mellitus type 2, uncontrolled, with complications ([place]) 85/63/1497    Past Surgical History:  Procedure Laterality Date  . AV FISTULA PLACEMENT Right 04/22/2019   Procedure: RIGHT ARM ARTERIOVENOUS (AV) FISTULA CREATION;  Surgeon: Angelia Mould, MD;  Location: [facility];  Service: Vascular;  Laterality: Right;  . CIRCUMCISION     as a child, around 1 years of age  . EYE SURGERY Bilateral    "several "  . PARS PLANA VITRECTOMY Right 03/25/2016   Procedure: PARS PLANA VITRECTOMY 25 GAUGE FOR ENDOPHTHALMITIS;  Surgeon: Jalene Mullet, MD;  Location: [facility];  Service: Ophthalmology;  Laterality: Right;  . WISDOM TOOTH EXTRACTION         Family History  Problem Relation Age of Onset  . Hypertension Mother   . Diabetes Mother   . Bladder Cancer Brother   . Kidney disease Other   . Colon cancer Neg Hx   . Rectal cancer Neg Hx     Social History   Tobacco Use  . Smoking status: Former Smoker    Packs/day: 0.75    Years: 3.00    Pack years: 2.25    Types: Cigarettes    Quit date: 05/28/2000    Years since quitting: 20.0  .  Smokeless tobacco: Never Used  Vaping Use  . Vaping Use: Never used  Substance Use Topics  . Alcohol use: Yes    Alcohol/week: 0.0 standard drinks    Comment: 1 beer  ocassional  . Drug use: No    Home Medications Prior to Admission medications   Medication Sig Start Date End Date Taking? Authorizing Provider  acetaminophen (TYLENOL) 500 MG tablet Take 500 mg by mouth every 6 (six) hours as needed.    [provider]  Alcohol Swabs (B-D SINGLE USE SWABS REGULAR) PADS Use as directed. 01/09/20   [name], MD  atorvastatin (LIPITOR) 40 MG tablet Take 1 tablet (40 mg total) by mouth daily. 10/22/19   [name], MD  Blood Glucose Monitoring Suppl (ACCU-CHEK GUIDE ME) w/Device KIT Use 3 times  daily before meals 01/22/20   [name], MD  calcitRIOL (ROCALTROL) 0.5 MCG capsule Take by mouth daily. Taking 5 capsules a Day    [provider]  carvedilol (COREG) 3.125 MG tablet Take 1 tablet (3.125 mg total) by mouth 2 (two) times daily. 09/23/19 01/22/20  Buford Dresser, MD  diclofenac sodium (VOLTAREN) 1 % GEL Apply 2 g topically 3 (three) times daily as needed (arthritic pain).    [provider]  glipiZIDE (GLUCOTROL) 5 MG tablet Take 0.5 tablets (2.5 mg total) by mouth daily. Hold for low blood glucose 01/09/20   [name], MD  glucose blood (ACCU-CHEK GUIDE) test strip Use as instructed 01/22/20   [name], MD  glycopyrrolate (ROBINUL) 2 MG tablet Take 1 tablet (2 mg total) by mouth 2 (two) times daily. 10/30/19   Esterwood, Amy S, PA-C  hydrALAZINE (APRESOLINE) 50 MG tablet Take 1 tablet (50 mg total) by mouth 2 (two) times daily. 01/09/20 04/08/20  [name], MD  levothyroxine (SYNTHROID) 137 MCG tablet TAKE 1 TABLET(137 MCG) BY MOUTH DAILY BEFORE BREAKFAST 04/07/20   Philemon Kingdom, MD  sildenafil (VIAGRA) 50 MG tablet Take 2 tablets (100 mg total) by mouth daily as needed for erectile dysfunction. At least 24 hours between doses. 12/31/19   [name], MD  torsemide (DEMADEX) 100 MG tablet Take 100 mg by mouth 2 (two) times daily.  06/25/18   [provider]  TRUEplus Lancets 33G MISC Use as instructed to check blood sugar daily. 01/09/20   [name], MD    Allergies    Patient has no known allergies.  Review of Systems   Review of Systems  Unable to perform ROS: Mental status change    Physical Exam Updated Vital Signs BP 135/78   Pulse (!) 102   Temp (S) (!) 101.3 F (38.5 C) (Oral)   Resp (!) 23   Ht 6' (1.829 m)   Wt 113.4 kg   SpO2 94%   BMI 33.91 kg/m   Physical Exam Vitals and nursing note reviewed.  Constitutional:      General: He is not in acute distress.    Appearance: He is well-developed. He  is ill-appearing and diaphoretic. He is not toxic-appearing.  HENT:     Head: Normocephalic and atraumatic.     Mouth/Throat:     Mouth: Mucous membranes are moist.     Pharynx: Oropharynx is clear.  Eyes:     Conjunctiva/sclera: Conjunctivae normal.  Cardiovascular:     Rate and Rhythm: Regular rhythm. Tachycardia present.     Pulses: Normal pulses.     Heart sounds: No murmur heard.      Comments: Bedside ultrasound performed  by Dr. Maryan Rued which showed a pericardial effusion however without evidence of chamber collapse Pulmonary:     Effort: No respiratory distress.     Breath sounds: Normal breath sounds.     Comments: Tachypneic, speaking in short sentences  Chest:     Chest wall: No tenderness.  Abdominal:     Palpations: Abdomen is soft.     Tenderness: There is no abdominal tenderness.  Musculoskeletal:        General: No tenderness.     Cervical back: Neck supple.     Right lower leg: Edema (Trace edema) present.     Left lower leg: Edema (Trace edema) present.  Skin:    General: Skin is warm.  Neurological:     General: No focal deficit present.     Mental Status: He is alert. He is disoriented.     Sensory: No sensory deficit.     Motor: No weakness.     Comments: A&Ox 2     ED Results / Procedures / Treatments   Labs (all labs ordered are listed, but only abnormal results are displayed) Labs Reviewed  SARS CORONAVIRUS 2 BY RT PCR (HOSPITAL ORDER, [facility]) - Abnormal; Notable for the following components:      Result Value   SARS Coronavirus 2 POSITIVE (*)    All other components within normal limits  COMPREHENSIVE METABOLIC PANEL - Abnormal; Notable for the following components:   CO2 15 (*)    Glucose, Bld 175 (*)    BUN 96 (*)    Creatinine, Ser 9.05 (*)    Calcium 8.8 (*)    Albumin 3.0 (*)    AST 60 (*)    GFR calc non Af Amer 6 (*)    GFR calc Af Amer 7 (*)    Anion gap 17 (*)    All other components within  normal limits  LACTIC ACID, PLASMA - Abnormal; Notable for the following components:   Lactic Acid, Venous 2.6 (*)    All other components within normal limits  CBC WITH DIFFERENTIAL/PLATELET - Abnormal; Notable for the following components:   RBC 3.78 (*)    Hemoglobin 11.9 (*)    HCT 38.3 (*)    MCV 101.3 (*)    RDW 15.8 (*)    nRBC 0.4 (*)    All other components within normal limits  I-STAT VENOUS BLOOD GAS, ED - Abnormal; Notable for the following components:   pCO2, Ven 24.1 (*)    pO2, Ven 59.0 (*)    Bicarbonate 15.0 (*)    TCO2 16 (*)    Acid-base deficit 8.0 (*)    Calcium, Ion 1.06 (*)    HCT 32.0 (*)    Hemoglobin 10.9 (*)    All other components within normal limits  CULTURE, BLOOD (ROUTINE X 2)  CULTURE, BLOOD (ROUTINE X 2)  PROTIME-INR  LACTIC ACID, PLASMA  URINALYSIS, ROUTINE W REFLEX MICROSCOPIC  TSH    EKG EKG Interpretation  Date/Time:  Saturday May 23 2020 16:29:16 EDT Ventricular Rate:  118 PR Interval:  128 QRS Duration: 82 QT Interval:  308 QTC Calculation: 431 R Axis:   -56 Text Interpretation: Sinus tachycardia Left anterior fascicular block Septal infarct , age undetermined No significant change since last tracing Confirmed by Blanchie Dessert [phone]) on 05/23/2020 5:47:23 PM   Radiology DG Chest Portable 1 View  Result Date: 05/23/2020 CLINICAL DATA:  Hypoxia.  Positive COVID. EXAM: PORTABLE CHEST 1 VIEW  COMPARISON:  03/21/2018 FINDINGS: The cardiopericardial silhouette appears significantly enlarged when compared to 2019 and may represent cardiomegaly and/or pericardial effusion. Bilateral LOWER lung hazy opacities are noted. No pleural effusion or pneumothorax. No acute bony abnormalities are present. IMPRESSION: 1. Significant enlargement of the cardiopericardial silhouette which may represent cardiomegaly and/or pericardial effusion. 2. Bilateral LOWER lung hazy opacities suspicious for infection/pneumonia. Electronically Signed   By:  Margarette Canada M.D.   On: 05/23/2020 17:04    Procedures Procedures (including critical care time)  Medications Ordered in ED Medications  acetaminophen (TYLENOL) tablet 650 mg (650 mg Oral Given 05/23/20 1824)    ED Course  I have reviewed the triage vital signs and the nursing notes.  Pertinent labs & imaging results that were available during my care of the patient were reviewed by me and considered in my medical decision making (see chart for details).    MDM Rules/Calculators/A&P                         49 year old male presents with confusion and reportedly Covid positive hypoxia On presentation, he is alert, oriented to self and can tell me he is at the hospital, but is not aware of the year.  He is slow to respond, and does not seem to be quite comprehending questions.  Blood pressure on arrival with 153/76, and a fever of 100.7.  He was mildly tachycardic with pulse between 102 and 104.  He did appear tachypneic on exam.  Arrived with saturations at 83%, placed on 4 L nasal cannula and improved to 94%.  Chest x-ray done in triage showed a cardiopericardial silhouette, bedside ultrasound performed by Dr. Maryan Rued which did show pericardial effusion but no evidence of chamber collapse.  He did have some decreased breath sounds, and trace lower extremity swelling.  DDX: COVID, pneumonia, PE, stroke, CHF  LABS:  Personally reviewed and interpreted his lab work CMP without any significant electrolyte abnormalities, glucose of 175, renal function consistent in the setting of stage V ESR.  AST elevated at 60, likely in the setting of COVID-19.  Slightly elevated anion gap of 17. CBC without leukocytosis, hemoglobin of 11.9 likely noncontributory.  Initial lactic acid of 2.6. Normal PT/INR. Sepsis order set initiated, blood cultures pending. TSH pending. Blood gas pending.  EKG with sinus tachycardia no significant changes from prior.  IMAGING:  Chest x-ray with questionable  pericardial effusion confirmed on bedside ultrasound and evidence of bilateral lower lobe pneumonia  MDM: Patient with hypoxia, positive Covid test and confusion.  Patient with no focal neuro deficits, doubt stroke.  No excessive evidence of fluid overload, doubt CHF, however he may require dialysis if his condition worsens.  Could not locate Covid test in the system, will will swab again.  However suspect that his confusion is secondary to infection rather than stroke. Do not think he needs CT head at this time.  Patient with evidence of mild to moderate pericardial effusion, will likely need echocardiogram evaluation in an inpatient setting.  Currently on 4 L and satting at 94%.  Requires further inpatient level of care, consulted family medicine will admit the patient for further evaluation and management  DISPOSITION: Pt will be admitted to the hospital for respiratory failure in the setting of COVID 19.   Patient seen and evaluated by Dr. Maryan Rued who is agreeable to the above plan and disposition.  Final Clinical Impression(s) / ED Diagnoses Final diagnoses:  Acute hypoxemic respiratory failure due  to COVID-19 [facility])    Rx / [place] Orders ED Discharge Orders    None       Garald Balding, PA-C 05/23/20 2013    Lyndel Safe 05/23/20 2018    Blanchie Dessert, MD 05/23/20 2036

## 2020-05-23 NOTE — ED Notes (Signed)
LeLe sister [phone] would like an update on the pt

## 2020-05-23 NOTE — H&P (Addendum)
[facility] Admission History and Physical Service Pager: [phone]  Patient name: Edward MCLEES Sr. Medical record number: [id number] Date of birth: 06-Feb-1971 Age: 49 y.o. Gender: male  Primary Care Provider: [name], MD Consultants: None (Curbsided Nephro and Cards) Code Status: Full Preferred Emergency Contact: Ex-Wife Edward [name] [phone]  Chief Complaint: SOB  Assessment and Plan: Edward BUNTYN Sr. is a 49 y.o. male presenting with shortness of breath secondary to Covid19 pneumonia. 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  Acute hypoxic respiratory failure secondary to Covid pneumonia: Patient arrives by EMS from home due to feeling confused today and having shortness of breath.  He was diagnosed with Covid 2 weeks ago per his family.  Patient desatted to 78% on admission and was placed on supplemental oxygen 4 L nasal cannula, received albuterol and Atrovent by EMS.  Patient was febrile on arrival with temperature 101.3 F.  Patient was also noted to be tachycardic at 118 bpm and tachypneic with respiratory rate 18-40/min.  In the ED patient received Tylenol for his fever.  Lactic acid on arrival was elevated at 2.6, improved to 1.6.  Chest x-ray in the emergency department was significant for enlarged cardiopericardial silhouette and bilateral lower lung hazy opacities suspicious for pneumonia.  VBG showed ph 7.402, pCO2 24.1, Bicarb 15.  Blood cultures drawn x2, urinalysis ordered.  Patient O2 sat improved to mid 90s on 6 L with tachycardia resolving.  Patient started on solumedrol and remdesivir.  Differential diagnosis for patient's acute hypoxic respiratory failure with recent history of COVID-19 is most likely due to his COVID-19 status, particularly due to the fact he is unvaccinated.  However, it is possible  this individual is suffering from CHF exacerbation with his cardiac history and cannot rule out bacterial pneumonia.  Speaking against bacterial pneumonia is the fact that he has a normal white blood cell count and his lactic acid has improved to normal levels.  Will check procalcitonin, and if within normal limits this will further support against bacterial pneumonia.  Patient's chest x-ray showing enlargement of cardio silhouette may be due to pericardial effusion which is present on ultrasound versus worsening CHF, however patient without obvious signs of fluid overload on exam. -Admit to family practice teaching service, attending Edward [name] -Continuous pulse ox -Continuous cardiac monitoring -We will check ferritin, D-dimer, magnesium, phosphorus per recommendations for COVID-19 patient -A.m. CBC/CMP -Procalcitonin -Follow-up blood cultures, VBG -Maintain airborne and contact precautions - IVF 100cc/hr normal saline  HFpEF Most recent echo in December 2019 with ejection fraction 50-55% and grade 2 diastolic dysfunction.  Patient with some bilateral lower limb edema without pitting, though he states this is at baseline.  Unable to verify home medications but per chart review they seem to include torsemide 100 mg twice daily, as well as Coreg and hydralazine for blood pressure control. -Holding home medication until can verify they are correct -Monitor fluid status -Continuous cardiac monitoring/continuous pulse ox -A.m. labs -IV fluids as above -A.m. echo  Pericardial effusion: Pericardial Effusion appreciated on bedside Echo without signs of tamponade per ED provider.  Curbsided cardiology:Per cards fellow recommends getting echocardiogram in the morning to evaluate for changing in heart failure as well as the pericardial effusion.  Per cardiology fellow recommends it is okay to provide some fluid tonight as long as the patient does not appear grossly volume overloaded on exam, which  he  does not, but that should the patient become hypotensive overnight or during the day tomorrow we should become very concerned for tamponade due to the pericardial effusion.  Cardiology fellow agreed with avoiding diuresis at this point, and that this case should be reconsidered if the patient worsens after getting a small amount of IV fluid. -Continuous cardiac monitor -Transthoracic echo in AM -Continuous pulse ox -IV fluid as above  CKD5:  Cr on admission 9.05, GFR 7.  Patient has fistula access that has not yet been used.  Most recent prior Cr of 6.77 in Nov 2020 and 4.01 in Nov 2019. Curb-sided nephrology who recommended continued monitoring and that could do small amount of fluid if needed for patient's heart rate etc. -AM CMP to monitor renal function -100 cc/hour normal saline while n.p.o.  Type 2 diabetes:  Glucose check on admission 175.  Previous A1c in May of 5.8.  Unable to verify home medications but per chart review they appear to be glipizide 2.5 mg/day. - A1C - Very sensitive sliding scale insulin  Hypothyroidism:  2/2 RAI therapy of Graves disease.  Home medications per chart review Synthroid 137 mcg/day. -Follow up TSH -Continue home Synthroid  Hypertension: Blood pressure on admission of 153/76, currently 133/74.  Unable to confirm patient's home blood pressure medication, though per chart review he is listed as taking hydralazine twice per day, torsemide 100 mg twice per day, and Coreg. -Currently holding blood pressure medications until can confirm current medications -We will continue to monitor blood pressure, though it is at normal limits right now.  Anemia Hemoglobin 10.9 on admission.  Per chart review baseline appears to be between 9 and 11.  Most likely anemia of chronic disease.  No current signs of bleeding. -A.m. CBC as above  FEN/GI: N.p.o. Prophylaxis: Heparin  Disposition: Med-tele  History of Present Illness:  Edward SISNEROS Sr. is a 49 y.o.  male presenting with shortness of breath after being diagnosed with COVID-19.  Per emergency department provider patient initially with some confusion, being alert to self.  Patient does have a history of renal disease with a fistula placed but has not yet started hemodialysis.  Patient was not vaccinated for COVID-19 and presented with hypoxia with an O2 sat of 78%.  In the emergency department ED provider did do a bedside ultrasound which showed a pericardial effusion but good pumping function.    Patient states he came in because of shortness of breath.  He states he has had some confusion for some time.  He did give permission to speak to his ex-wife as well as his 2 sisters for more information.  He states he has a history of blindness but thinks his vision is "coming and going" lately. Patient is slow to answer but appears to answer appropriately when asked most questions. Denies chest pains but states he's had fevers and sore throat lately.  Patient denies alcohol, tobacco, or illicit drugs.  Per patient's ex-wife: Patient tested positive for COVID 2 weeks ago, started seeming confused around that time to his ex-wife. He was forgetting some of the children. She states his children were concerned that he wasn't eating or drinking the last week or two while being sick. She said she came to his home to pick up the children her daughter requested that 911 be called because of his shortness of breath and difficult walking. Did not get vaccinated.   Patient's sister believes it was around the 9th that he tested positive  for COVID.  States that the entire family has COVID-19 and because they are keeping their distance from each other no one realized how short of breath he was until earlier today which is what prompted them to bring him to the emergency department.  She states that he has been confused since his diagnosis of COVID-19 2 weeks ago.  She states that before that he was "a normal person" but  since 2 weeks ago he has been very confused which she thought was just due to COVID-19.  Of note: Neither patient, ex-wife, nor his sisters are able to verify his current medications.   Review Of Systems: Per HPI with the following additions:   Review of Systems  Constitutional: Positive for fever.  HENT: Positive for sore throat.   Eyes: Positive for visual disturbance.  Respiratory: Positive for cough and shortness of breath.   Cardiovascular: Negative for chest pain.  Gastrointestinal: Negative for abdominal pain, diarrhea, nausea and vomiting.  Genitourinary: Negative for dysuria.  Neurological: Negative for headaches.  Psychiatric/Behavioral: Positive for confusion.     Patient Active Problem List   Diagnosis Date Noted  . COVID-19 05/23/2020  . Hx of adenomatous and sessile serrated colonic polyps 11/21/2019  . Chronic diastolic heart failure (HCC) 10/24/2018  . 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 ED (erectile dysfunction) 08/10/2017  . Chronic kidney disease 07/18/2017  . Arthralgia 07/18/2017  . Arthritis 07/18/2017  . Graves disease 03/02/2016  . Non compliance with medical treatment 11/26/2015  . Uncontrolled hypertension 11/26/2015  . Acute kidney injury superimposed on chronic kidney disease (HCC) 11/25/2015  . Hyperkalemia 11/25/2015  . Diabetes mellitus type 2, uncontrolled, with complications (HCC) 05/05/2015    Past Medical History: Past Medical History:  Diagnosis Date  . Anemia   . Arthritis   . Blind    Left eye  . Cataract   . CHF (congestive heart failure) (HCC)   . Chronic kidney disease    stage 5  . Depression    situatuional  . Diabetes mellitus    Type II  . Graves disease 2014  . Headache    in past  . Hx of adenomatous and sessile serrated colonic polyps 11/21/2019  . Hyperlipidemia   . Hypertension   . Hypothyroidism   . Legally blind    right eye  . Neuropathy   . Non-compliance   . Shortness of breath dyspnea     "Better since I've been taking my medications"    Past Surgical History: Past Surgical History:  Procedure Laterality Date  . AV FISTULA PLACEMENT Right 04/22/2019   Procedure: RIGHT ARM ARTERIOVENOUS (AV) FISTULA CREATION;  Surgeon: Chuck Hint, MD;  Location: [facility] OR;  Service: Vascular;  Laterality: Right;  . CIRCUMCISION     as a child, around 14 years of age  . EYE SURGERY Bilateral    "several "  . PARS PLANA VITRECTOMY Right 03/25/2016   Procedure: PARS PLANA VITRECTOMY 25 GAUGE FOR ENDOPHTHALMITIS;  Surgeon: Carmela Rima, MD;  Location: [facility] OR;  Service: Ophthalmology;  Laterality: Right;  . WISDOM TOOTH EXTRACTION      Social History: Social History   Tobacco Use  . Smoking status: Former Smoker    Packs/day: 0.75    Years: 3.00    Pack years: 2.25    Types: Cigarettes    Quit date: 05/28/2000    Years since quitting: 20.0  . Smokeless tobacco: Never Used  Vaping Use  . Vaping Use: Never used  Substance  Use Topics  . Alcohol use: Yes    Alcohol/week: 0.0 standard drinks    Comment: 1 beer  ocassional  . Drug use: No   Additional social history Please also refer to relevant sections of EMR.  Family History: Family History  Problem Relation Age of Onset  . Hypertension Mother   . Diabetes Mother   . Bladder Cancer Brother   . Kidney disease Other   . Colon cancer Neg Hx   . Rectal cancer Neg Hx     Allergies and Medications: No Known Allergies No current facility-administered medications on file prior to encounter.   Current Outpatient Medications on File Prior to Encounter  Medication Sig Dispense Refill  . acetaminophen (TYLENOL) 500 MG tablet Take 500 mg by mouth every 6 (six) hours as needed.    . Alcohol Swabs (B-D SINGLE USE SWABS REGULAR) PADS Use as directed. 100 each 6  . atorvastatin (LIPITOR) 40 MG tablet Take 1 tablet (40 mg total) by mouth daily. 30 tablet 6  . Blood Glucose Monitoring Suppl (ACCU-CHEK GUIDE ME) w/Device KIT Use 3  times daily before meals 1 kit 0  . calcitRIOL (ROCALTROL) 0.5 MCG capsule Take 30 mcg by mouth daily.     . carvedilol (COREG) 3.125 MG tablet Take 1 tablet (3.125 mg total) by mouth 2 (two) times daily. 90 tablet 0  . diclofenac sodium (VOLTAREN) 1 % GEL Apply 2 g topically 3 (three) times daily as needed (arthritic pain).    Marland Kitchen glipiZIDE (GLUCOTROL) 5 MG tablet Take 0.5 tablets (2.5 mg total) by mouth daily. Hold for low blood glucose 15 tablet 6  . glucose blood (ACCU-CHEK GUIDE) test strip Use as instructed 100 each 12  . glycopyrrolate (ROBINUL) 2 MG tablet Take 1 tablet (2 mg total) by mouth 2 (two) times daily. 180 tablet 3  . hydrALAZINE (APRESOLINE) 50 MG tablet Take 1 tablet (50 mg total) by mouth 2 (two) times daily. 60 tablet 3  . levothyroxine (SYNTHROID) 137 MCG tablet TAKE 1 TABLET(137 MCG) BY MOUTH DAILY BEFORE BREAKFAST (Patient taking differently: Take 137 mcg by mouth daily before breakfast. ) 90 tablet 1  . sildenafil (VIAGRA) 50 MG tablet Take 2 tablets (100 mg total) by mouth daily as needed for erectile dysfunction. At least 24 hours between doses. 10 tablet 1  . torsemide (DEMADEX) 100 MG tablet Take 100 mg by mouth 2 (two) times daily.   6  . TRUEplus Lancets 33G MISC Use as instructed to check blood sugar daily. 100 each 6    Objective: BP 133/74   Pulse 97   Temp (S) (!) 101.3 F (38.5 C) (Oral)   Resp (!) 26   Ht 6' (1.829 m)   Wt 113.4 kg   SpO2 95%   BMI 33.91 kg/m  Exam: General: Alert, no apparent distress, patient with blindness and obvious defect of left eye.  Eyes: Left eye with blindness, right eye does react to light. ENTM: No pharyengeal erythema Neck: nontender Cardiovascular: Mildly tachycardic with regular sounding rhythm, no murmurs Respiratory: Crackles present bilaterally, nasal cannula in place with 6 L  Gastrointestinal: Bowel sounds present. No abdominal pain MSK: Upper extremity strength 5/5 bilaterally, Lower extremity strength 5/5  bilaterally  Derm: No rashes noted Neuro: A&O to person, state and situation, would not answer the current year. Appears confused, CN II-XII intact with 5/5 in upper and lower extremities bilaterally, fine touch sensation present in upper and lower extremities bilaterally. Psych: Appears confused. Extremities: Lower  limbs with some edema bilaterally without pitting  Labs and Imaging: CBC BMET  Recent Labs  Lab 05/23/20 1636 05/23/20 1636 05/23/20 1948  WBC 7.9  --   --   HGB 11.9*   < > 10.9*  HCT 38.3*   < > 32.0*  PLT 268  --   --    < > = values in this interval not displayed.   Recent Labs  Lab 05/23/20 1636 05/23/20 1636 05/23/20 1948  NA 140   < > 139  K 5.1   < > 4.6  CL 108  --   --   CO2 15*  --   --   BUN 96*  --   --   CREATININE 9.05*  --   --   GLUCOSE 175*  --   --   CALCIUM 8.8*  --   --    < > = values in this interval not displayed.     EKG: EKG Interpretation  Date/Time:  Saturday May 23 2020 16:29:16 EDT Ventricular Rate:  118 PR Interval:  128 QRS Duration: 82 QT Interval:  308 QTC Calculation: 431 R Axis:   -56 Text Interpretation: Sinus tachycardia Left anterior fascicular block Septal infarct , age undetermined No significant change since last tracing Confirmed by Blanchie Dessert [phone]) on 05/23/2020 5:47:23 PM   Lurline Del, DO 05/23/2020, 9:39 PM PGY-2, [place] [facility] pager: [phone], text pages welcome

## 2020-05-23 NOTE — ED Triage Notes (Signed)
The pt arrived by gems from home  covid positive 2 weeks ago today confused sob a and  o x2  Neg stroke scale low 02 sats on nasal 02 tachycardia initial sats 78% dialysis fistula  New rt arm  No dialysis yet.  Banded  Iv per ems  hhn given albuterol 5.0 and atrovent 0.5 by ems  Legally blind  Nasal 02 4 liters nasal

## 2020-05-23 NOTE — ED Notes (Signed)
Bertram Savin- sister- [phone]- would like pt updates

## 2020-05-24 ENCOUNTER — Encounter (HOSPITAL_COMMUNITY): Payer: Self-pay | Admitting: Family Medicine

## 2020-05-24 ENCOUNTER — Observation Stay (HOSPITAL_BASED_OUTPATIENT_CLINIC_OR_DEPARTMENT_OTHER): Payer: Medicare PPO

## 2020-05-24 ENCOUNTER — Other Ambulatory Visit: Payer: Self-pay

## 2020-05-24 DIAGNOSIS — N179 Acute kidney failure, unspecified: Secondary | ICD-10-CM

## 2020-05-24 DIAGNOSIS — N186 End stage renal disease: Secondary | ICD-10-CM | POA: Diagnosis present

## 2020-05-24 DIAGNOSIS — J1282 Pneumonia due to coronavirus disease 2019: Secondary | ICD-10-CM

## 2020-05-24 DIAGNOSIS — E1139 Type 2 diabetes mellitus with other diabetic ophthalmic complication: Secondary | ICD-10-CM | POA: Diagnosis not present

## 2020-05-24 DIAGNOSIS — J9601 Acute respiratory failure with hypoxia: Secondary | ICD-10-CM | POA: Diagnosis present

## 2020-05-24 DIAGNOSIS — E11319 Type 2 diabetes mellitus with unspecified diabetic retinopathy without macular edema: Secondary | ICD-10-CM | POA: Diagnosis present

## 2020-05-24 DIAGNOSIS — H548 Legal blindness, as defined in USA: Secondary | ICD-10-CM

## 2020-05-24 DIAGNOSIS — E890 Postprocedural hypothyroidism: Secondary | ICD-10-CM | POA: Diagnosis present

## 2020-05-24 DIAGNOSIS — U071 COVID-19: Secondary | ICD-10-CM

## 2020-05-24 DIAGNOSIS — E860 Dehydration: Secondary | ICD-10-CM | POA: Diagnosis present

## 2020-05-24 DIAGNOSIS — R809 Proteinuria, unspecified: Secondary | ICD-10-CM | POA: Diagnosis present

## 2020-05-24 DIAGNOSIS — N185 Chronic kidney disease, stage 5: Secondary | ICD-10-CM

## 2020-05-24 DIAGNOSIS — G9341 Metabolic encephalopathy: Secondary | ICD-10-CM | POA: Diagnosis present

## 2020-05-24 DIAGNOSIS — I152 Hypertension secondary to endocrine disorders: Secondary | ICD-10-CM | POA: Diagnosis present

## 2020-05-24 DIAGNOSIS — I5032 Chronic diastolic (congestive) heart failure: Secondary | ICD-10-CM | POA: Diagnosis present

## 2020-05-24 DIAGNOSIS — E8809 Other disorders of plasma-protein metabolism, not elsewhere classified: Secondary | ICD-10-CM | POA: Diagnosis present

## 2020-05-24 DIAGNOSIS — I313 Pericardial effusion (noninflammatory): Secondary | ICD-10-CM

## 2020-05-24 DIAGNOSIS — E878 Other disorders of electrolyte and fluid balance, not elsewhere classified: Secondary | ICD-10-CM | POA: Diagnosis present

## 2020-05-24 DIAGNOSIS — E1169 Type 2 diabetes mellitus with other specified complication: Secondary | ICD-10-CM | POA: Diagnosis present

## 2020-05-24 DIAGNOSIS — E1122 Type 2 diabetes mellitus with diabetic chronic kidney disease: Secondary | ICD-10-CM | POA: Diagnosis present

## 2020-05-24 DIAGNOSIS — T381X6A Underdosing of thyroid hormones and substitutes, initial encounter: Secondary | ICD-10-CM | POA: Diagnosis present

## 2020-05-24 DIAGNOSIS — E785 Hyperlipidemia, unspecified: Secondary | ICD-10-CM | POA: Diagnosis present

## 2020-05-24 DIAGNOSIS — N2581 Secondary hyperparathyroidism of renal origin: Secondary | ICD-10-CM | POA: Diagnosis present

## 2020-05-24 DIAGNOSIS — E872 Acidosis: Secondary | ICD-10-CM | POA: Diagnosis present

## 2020-05-24 DIAGNOSIS — Z992 Dependence on renal dialysis: Secondary | ICD-10-CM | POA: Diagnosis not present

## 2020-05-24 DIAGNOSIS — E1165 Type 2 diabetes mellitus with hyperglycemia: Secondary | ICD-10-CM | POA: Diagnosis present

## 2020-05-24 HISTORY — DX: Proteinuria, unspecified: R80.9

## 2020-05-24 LAB — CBC WITH DIFFERENTIAL/PLATELET
Abs Immature Granulocytes: 0 10*3/uL (ref 0.00–0.07)
Basophils Absolute: 0 10*3/uL (ref 0.0–0.1)
Basophils Relative: 0 %
Eosinophils Absolute: 0 10*3/uL (ref 0.0–0.5)
Eosinophils Relative: 0 %
HCT: 36.5 % — ABNORMAL LOW (ref 39.0–52.0)
Hemoglobin: 11.3 g/dL — ABNORMAL LOW (ref 13.0–17.0)
Lymphocytes Relative: 6 %
Lymphs Abs: 0.4 10*3/uL — ABNORMAL LOW (ref 0.7–4.0)
MCH: 31.2 pg (ref 26.0–34.0)
MCHC: 31 g/dL (ref 30.0–36.0)
MCV: 100.8 fL — ABNORMAL HIGH (ref 80.0–100.0)
Monocytes Absolute: 0 10*3/uL — ABNORMAL LOW (ref 0.1–1.0)
Monocytes Relative: 0 %
Neutro Abs: 6.7 10*3/uL (ref 1.7–7.7)
Neutrophils Relative %: 94 %
Platelets: 246 10*3/uL (ref 150–400)
RBC: 3.62 MIL/uL — ABNORMAL LOW (ref 4.22–5.81)
RDW: 15.8 % — ABNORMAL HIGH (ref 11.5–15.5)
WBC: 7.1 10*3/uL (ref 4.0–10.5)
nRBC: 0 /100 WBC
nRBC: 0.4 % — ABNORMAL HIGH (ref 0.0–0.2)

## 2020-05-24 LAB — C-REACTIVE PROTEIN: CRP: 7 mg/dL — ABNORMAL HIGH (ref <1.0)

## 2020-05-24 LAB — URINALYSIS, ROUTINE W REFLEX MICROSCOPIC
Bacteria, UA: NONE SEEN
Bilirubin Urine: NEGATIVE
Glucose, UA: 150 mg/dL — AB
Ketones, ur: NEGATIVE mg/dL
Leukocytes,Ua: NEGATIVE
Nitrite: NEGATIVE
Protein, ur: >=300 mg/dL — AB
Specific Gravity, Urine: 1.015 (ref 1.005–1.030)
pH: 5 (ref 5.0–8.0)

## 2020-05-24 LAB — ECHOCARDIOGRAM COMPLETE
AR max vel: 3.6 cm2
AV Area VTI: 3.6 cm2
AV Area mean vel: 3.29 cm2
AV Mean grad: 2 mmHg
AV Peak grad: 3.9 mmHg
Ao pk vel: 0.99 m/s
Height: 72 in
S' Lateral: 3.6 cm
Weight: 4000.03 oz

## 2020-05-24 LAB — CBG MONITORING, ED
Glucose-Capillary: 203 mg/dL — ABNORMAL HIGH (ref 70–99)
Glucose-Capillary: 192 mg/dL — ABNORMAL HIGH (ref 70–99)
Glucose-Capillary: 231 mg/dL — ABNORMAL HIGH (ref 70–99)

## 2020-05-24 LAB — COMPREHENSIVE METABOLIC PANEL
ALT: 32 U/L (ref 0–44)
AST: 50 U/L — ABNORMAL HIGH (ref 15–41)
Albumin: 2.5 g/dL — ABNORMAL LOW (ref 3.5–5.0)
Alkaline Phosphatase: 55 U/L (ref 38–126)
Anion gap: 15 (ref 5–15)
BUN: 97 mg/dL — ABNORMAL HIGH (ref 6–20)
CO2: 17 mmol/L — ABNORMAL LOW (ref 22–32)
Calcium: 8.9 mg/dL (ref 8.9–10.3)
Chloride: 108 mmol/L (ref 98–111)
Creatinine, Ser: 8.69 mg/dL — ABNORMAL HIGH (ref 0.61–1.24)
GFR calc Af Amer: 7 mL/min — ABNORMAL LOW (ref >60)
GFR calc non Af Amer: 6 mL/min — ABNORMAL LOW (ref >60)
Glucose, Bld: 233 mg/dL — ABNORMAL HIGH (ref 70–99)
Potassium: 6.2 mmol/L — ABNORMAL HIGH (ref 3.5–5.1)
Sodium: 140 mmol/L (ref 135–145)
Total Bilirubin: 0.7 mg/dL (ref 0.3–1.2)
Total Protein: 7 g/dL (ref 6.5–8.1)

## 2020-05-24 LAB — HEMOGLOBIN A1C
Hgb A1c MFr Bld: 6.7 % — ABNORMAL HIGH (ref 4.8–5.6)
Mean Plasma Glucose: 145.59 mg/dL

## 2020-05-24 LAB — MAGNESIUM: Magnesium: 1.9 mg/dL (ref 1.7–2.4)

## 2020-05-24 LAB — PROCALCITONIN: Procalcitonin: 1.64 ng/mL

## 2020-05-24 LAB — BRAIN NATRIURETIC PEPTIDE: B Natriuretic Peptide: 109.2 pg/mL — ABNORMAL HIGH (ref 0.0–100.0)

## 2020-05-24 LAB — PROTEIN / CREATININE RATIO, URINE
Creatinine, Urine: 187.64 mg/dL
Protein Creatinine Ratio: 3.39 mg/mg{Cre} — ABNORMAL HIGH (ref 0.00–0.15)
Total Protein, Urine: 636 mg/dL

## 2020-05-24 LAB — CK: Total CK: 815 U/L — ABNORMAL HIGH (ref 49–397)

## 2020-05-24 LAB — PHOSPHORUS: Phosphorus: 6.2 mg/dL — ABNORMAL HIGH (ref 2.5–4.6)

## 2020-05-24 LAB — ABO/RH: ABO/RH(D): B POS

## 2020-05-24 LAB — HIV ANTIBODY (ROUTINE TESTING W REFLEX): HIV Screen 4th Generation wRfx: NONREACTIVE

## 2020-05-24 LAB — D-DIMER, QUANTITATIVE: D-Dimer, Quant: 13.24 ug/mL-FEU — ABNORMAL HIGH (ref 0.00–0.50)

## 2020-05-24 LAB — FERRITIN: Ferritin: 3961 ng/mL — ABNORMAL HIGH (ref 24–336)

## 2020-05-24 MED ORDER — SODIUM ZIRCONIUM CYCLOSILICATE 10 G PO PACK
10.0000 g | PACK | Freq: Once | ORAL | Status: AC
  Administered 2020-05-24: 10 g via ORAL
  Filled 2020-05-24: qty 1

## 2020-05-24 MED ORDER — METHYLPREDNISOLONE SODIUM SUCC 125 MG IJ SOLR: 60.0000 mg | Freq: Two times a day (BID) | INTRAMUSCULAR | Status: DC

## 2020-05-24 MED ORDER — SODIUM BICARBONATE 650 MG PO TABS
1300.0000 mg | ORAL_TABLET | Freq: Three times a day (TID) | ORAL | Status: DC
  Administered 2020-05-24 – 2020-05-26 (×6): 1300 mg via ORAL
  Filled 2020-05-24 (×8): qty 2

## 2020-05-24 MED ORDER — CHLORHEXIDINE GLUCONATE CLOTH 2 % EX PADS
6.0000 | MEDICATED_PAD | Freq: Every day | CUTANEOUS | Status: DC
  Administered 2020-05-25 – 2020-05-28 (×4): 6 via TOPICAL

## 2020-05-24 MED ORDER — DEXAMETHASONE SODIUM PHOSPHATE 10 MG/ML IJ SOLN
6.0000 mg | Freq: Every day | INTRAMUSCULAR | Status: DC
  Administered 2020-05-24 – 2020-05-28 (×5): 6 mg via INTRAVENOUS
  Filled 2020-05-24 (×5): qty 1

## 2020-05-24 NOTE — ED Notes (Signed)
Update with Family member

## 2020-05-24 NOTE — ED Notes (Signed)
Meal ordered

## 2020-05-24 NOTE — ED Notes (Signed)
Attempted blood draw x2 pt LT hand. Unable to use pt RT limb for BP and sticks.

## 2020-05-24 NOTE — TOC Initial Note (Signed)
Transition of Care (TOC) - Initial/Assessment Note    Patient Details  Name: Edward [name]. MRN: [id number] Date of Birth: 1971-10-03  Transition of Care [facility]) CM/SW Contact:    Verdell Carmine, RN Phone Number: 05/24/2020, 2:17 PM  Clinical Narrative:                 Admitted yesterday with [place] [facility] on 5L [place] has [employer]. Has been going to [facility] monitoring renal function. Creatinine on admission 9 K 5.1 Nephrology consult revealed the patient wlll require long standing dialysis.  Need to place catheter in for hemodialysis  Will email Terri Piedra, CSW for Dialysis to let her know about this patient so she can review the chart and speak to the patient.WIll follow for needs , may need home o2   Expected Discharge Plan: [place] Barriers to Discharge: Continued Medical Work up   Patient Goals and CMS Choice        Expected Discharge Plan and Services Expected Discharge Plan: [place] In-house Referral: Clinical Social Work Discharge Planning Services: CM Consult                                          Prior Living Arrangements/Services   Lives with:: Self Patient language and need for interpreter reviewed:: Yes        Need for Family Participation in Patient Care: Yes (Comment) Care giver support system in place?: Yes (comment)   Criminal Activity/Legal Involvement Pertinent to Current Situation/Hospitalization: No - Comment as needed  Activities of Daily Living      Permission Sought/Granted      Share Information with NAME: Diplomatic Services operational officer - ex wife           Emotional Assessment Appearance:: Appears stated age     Orientation: : Oriented to Self, Oriented to Place, Oriented to  Time, Oriented to Situation Alcohol / Substance Use: Not Applicable Psych Involvement: No (comment)  Admission diagnosis:  COVID-19 [U07.1] Pneumonia due to COVID-19 virus [U07.1, J12.82] Patient Active Problem  List   Diagnosis Date Noted   Hypertension associated with stage 5 chronic kidney disease due to type 2 diabetes mellitus ([place]) 05/24/2020   Hyperphosphatemia 05/24/2020   Proteinuria 05/24/2020   Legally blind 05/24/2020   Diabetic retinopathy of both eyes ([place]) 17/51/0258   Acute metabolic encephalopathy 52/77/8242   Acute hypoxemic respiratory failure due to COVID-19 ([place]) 05/24/2020   Pneumonia due to COVID-19 virus 05/23/2020   Chronic diastolic heart failure ([place]) 10/24/2018   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   Mitral regurgitation, Moderate 09/05/2018   ED (erectile dysfunction) 08/10/2017   Chronic kidney disease, stage 5 ([place]) 07/18/2017   Graves disease 03/02/2016   Non compliance with medical treatment 11/26/2015   Uncontrolled hypertension 11/26/2015   Acute on chronic renal failure ([place]) 11/25/2015   DM (diabetes mellitus), type 2 with ophthalmic complications ([place]) 35/36/1443   PCP:  [name], MD Pharmacy:   [facility] [place], [place] [address] AT [facility] [address] [place] [address] Phone: [phone] Fax: [place], [address] [place] [address] Phone: [phone] Fax: [phone]     Social Determinants of Health (SDOH) Interventions    Readmission Risk  Interventions No flowsheet data found.

## 2020-05-24 NOTE — ED Notes (Signed)
Updated brother Clelia Croft on pt status. Relayed message to pt.

## 2020-05-24 NOTE — ED Notes (Signed)
Attempted to contact Edward [name] , No answer left message .

## 2020-05-24 NOTE — ED Notes (Signed)
Pt on 4L O2 via [place].

## 2020-05-24 NOTE — Consult Note (Signed)
Nephrology Consult   Requesting provider: Sherren Mocha McDiarmid Service requesting consult: Family medicine Reason for consult: AKI on CKD V   Assessment/Recommendations: Edward STECH Sr. is a/an 49 y.o. male with a past medical history CKD V, HTN, DM2 who present w/ COVID 19  AKI on CKD V now ESRD: Likely secondary to dehydration but likely progression of disease with a baseline of 6.  Patient likely needs to start dialysis at this time given the progression of his disease and unlikelihood that will remain dialysis independent in the future.  I discussed this with patient and he is hesitant but understands -Tentatively plan for hemodialysis initiation tomorrow -PVL of aVF to ensure that it is ready for use -Consider VVS involvement if needed for catheter placement and/or manipulation of AVF -Management of electrolyte disturbances as below -Appears euvolemic.  Can stop fluids at this time -Continue to monitor daily Cr, Dose meds for GFR -Monitor Daily I/Os, Daily weight  -Maintain MAP>65 for optimal renal perfusion.  -Avoid nephrotoxic medications including NSAIDs and Vanc/Zosyn combo  Hyperkalemia: Potassium 6.2 today secondary to AKI on CKD.  Lokelma 10 g x 1.  Continue to monitor on daily labs.  Dialysis as above and bicarbonate supplementation as below  Non-anion gap metabolic acidosis: Related to kidney disease.  Sodium bicarb and 1300 mg 3 times daily  Acute hypoxic respiratory failure: Secondary to Covid 19.  Treatment and management per primary team.  CHF/pericardial effusion: History of diastolic heart failure.  Gentle fluids as needed.  follow-up echocardiogram  Uncontrolled Diabetes Mellitus Type 2 with Hyperglycemia: Management per primary team  Hypertension: Blood pressure mildly elevated at this time continue to monitor   Recommendations conveyed to primary service.    [facility] 05/24/2020 12:06  PM   _____________________________________________________________________________________ CC: AKI on CKD  History of Present Illness: Edward KISSOON Sr. is a/an 49 y.o. male with a past medical history of diabetes mellitus type 2 uncontrolled with hyperglycemia, CKD, hypertension who presents with cough and shortness of breath found to have COVID-19.  Patient states that for the past few days he has had worsening shortness of breath that led to come to the emergency department.  He has a difficult time giving more details events.  Based on chart review patient's ex-wife stated that he and his children have been positive for Covid for a couple weeks.  He was noticeably more confused at home and was not eating or drinking much.  Patient has not been vaccinated against COVID-19.  Based on chart review the patient presented to the emergency department was found to be hypoxic with oxygen saturations at 78 percent.  Ultrasound in the emergency department demonstrated possible pericardial effusion.  Patient states that he continues to be short of breath and had fevers.  He also has some sore throat and cough.  He is does not know of any difference in his urine output.  He follows in our office and was seen last in April.  Around that time his creatinine was 6.  Is been noted that he is noncompliant with medications and has had very poorly controlled diabetes in the past.  He has an AV fistula that has not been used.  Access was placed on 04/22/2019.  Medications:  Current Facility-Administered Medications  Medication Dose Route Frequency Provider Last Rate Last Admin  . acetaminophen (TYLENOL) tablet 650 mg  650 mg Oral Q6H PRN Lurline Del, DO      . [START ON 05/25/2020] Chlorhexidine  Gluconate Cloth 2 % PADS 6 each  6 each Topical Q0600 Reesa Chew, MD      . dexamethasone (DECADRON) injection 6 mg  6 mg Intravenous Q0600 Simmons-Robinson, Makiera, MD      . heparin injection 5,000 Units   5,000 Units Subcutaneous Q8H Welborn, Ryan, DO   5,000 Units at 05/24/20 0556  . insulin aspart (novoLOG) injection 0-6 Units  0-6 Units Subcutaneous TID WC Lurline Del, DO   2 Units at 05/24/20 0845  . levothyroxine (SYNTHROID) tablet 137 mcg  137 mcg Oral Q0600 Lurline Del, DO   137 mcg at 05/24/20 0556  . remdesivir 100 mg in sodium chloride 0.9 % 100 mL IVPB  100 mg Intravenous Daily Welborn, Ryan, DO      . sodium zirconium cyclosilicate (LOKELMA) packet 10 g  10 g Oral Once Simmons-Robinson, Makiera, MD       Current Outpatient Medications  Medication Sig Dispense Refill  . acetaminophen (TYLENOL) 500 MG tablet Take 500 mg by mouth every 6 (six) hours as needed.    . Alcohol Swabs (B-D SINGLE USE SWABS REGULAR) PADS Use as directed. 100 each 6  . atorvastatin (LIPITOR) 40 MG tablet Take 1 tablet (40 mg total) by mouth daily. 30 tablet 6  . Blood Glucose Monitoring Suppl (ACCU-CHEK GUIDE ME) w/Device KIT Use 3 times daily before meals 1 kit 0  . calcitRIOL (ROCALTROL) 0.5 MCG capsule Take 30 mcg by mouth daily.     . carvedilol (COREG) 3.125 MG tablet Take 1 tablet (3.125 mg total) by mouth 2 (two) times daily. 90 tablet 0  . diclofenac sodium (VOLTAREN) 1 % GEL Apply 2 g topically 3 (three) times daily as needed (arthritic pain).    Marland Kitchen glipiZIDE (GLUCOTROL) 5 MG tablet Take 0.5 tablets (2.5 mg total) by mouth daily. Hold for low blood glucose 15 tablet 6  . glucose blood (ACCU-CHEK GUIDE) test strip Use as instructed 100 each 12  . glycopyrrolate (ROBINUL) 2 MG tablet Take 1 tablet (2 mg total) by mouth 2 (two) times daily. 180 tablet 3  . hydrALAZINE (APRESOLINE) 50 MG tablet Take 1 tablet (50 mg total) by mouth 2 (two) times daily. 60 tablet 3  . levothyroxine (SYNTHROID) 137 MCG tablet TAKE 1 TABLET(137 MCG) BY MOUTH DAILY BEFORE BREAKFAST (Patient taking differently: Take 137 mcg by mouth daily before breakfast. ) 90 tablet 1  . sildenafil (VIAGRA) 50 MG tablet Take 2 tablets (100 mg  total) by mouth daily as needed for erectile dysfunction. At least 24 hours between doses. 10 tablet 1  . torsemide (DEMADEX) 100 MG tablet Take 100 mg by mouth 2 (two) times daily.   6  . TRUEplus Lancets 33G MISC Use as instructed to check blood sugar daily. 100 each 6     ALLERGIES Patient has no known allergies.  MEDICAL HISTORY Past Medical History:  Diagnosis Date  . Acute on chronic renal failure ([place]) 11/25/2015  . Anemia   . Arthritis   . Blind    Left eye  . Cataract   . CHF (congestive heart failure) ([place])   . Chronic kidney disease    stage 5  . Depression    situatuional  . Diabetes mellitus    Type II  . Graves disease 2014  . Headache    in past  . Hx of adenomatous and sessile serrated colonic polyps 11/21/2019  . Hyperlipidemia   . Hypertension   . Hypothyroidism   . Legally blind  right eye  . Neuropathy   . Non-compliance   . Proteinuria 05/24/2020  . Shortness of breath dyspnea    "Better since I've been taking my medications"     SOCIAL HISTORY Social History   Socioeconomic History  . Marital status: Married    Spouse name: Not on file  . Number of children: Not on file  . Years of education: Not on file  . Highest education level: Not on file  Occupational History  . Not on file  Tobacco Use  . Smoking status: Former Smoker    Packs/day: 0.75    Years: 3.00    Pack years: 2.25    Types: Cigarettes    Quit date: 05/28/2000    Years since quitting: 20.0  . Smokeless tobacco: Never Used  Vaping Use  . Vaping Use: Never used  Substance and Sexual Activity  . Alcohol use: Yes    Alcohol/week: 0.0 standard drinks    Comment: 1 beer  ocassional  . Drug use: No  . Sexual activity: Not on file  Other Topics Concern  . Not on file  Social History Narrative  . Not on file   Social Determinants of Health   Financial Resource Strain:   . Difficulty of Paying Living Expenses: Not on file  Food Insecurity:   . Worried About Paediatric nurse in the Last Year: Not on file  . Ran Out of Food in the Last Year: Not on file  Transportation Needs:   . Lack of Transportation (Medical): Not on file  . Lack of Transportation (Non-Medical): Not on file  Physical Activity:   . Days of Exercise per Week: Not on file  . Minutes of Exercise per Session: Not on file  Stress:   . Feeling of Stress : Not on file  Social Connections:   . Frequency of Communication with Friends and Family: Not on file  . Frequency of Social Gatherings with Friends and Family: Not on file  . Attends Religious Services: Not on file  . Active Member of Clubs or Organizations: Not on file  . Attends Archivist Meetings: Not on file  . Marital Status: Not on file  Intimate Partner Violence:   . Fear of Current or Ex-Partner: Not on file  . Emotionally Abused: Not on file  . Physically Abused: Not on file  . Sexually Abused: Not on file     FAMILY HISTORY Family History  Problem Relation Age of Onset  . Hypertension Mother   . Diabetes Mother   . Bladder Cancer Brother   . Kidney disease Other   . Colon cancer Neg Hx   . Rectal cancer Neg Hx       Review of Systems: 12 systems reviewed Otherwise as per HPI, all other systems reviewed and negative  Physical Exam: Vitals:   05/24/20 0730 05/24/20 0735  BP: (!) 158/100   Pulse: 92 73  Resp: (!) 28 (!) 23  Temp:    SpO2: 94% 97%   Total I/O In: -  Out: 900 [Urine:900]  Intake/Output Summary (Last 24 hours) at 05/24/2020 1206 Last data filed at 05/24/2020 0857 Gross per 24 hour  Intake --  Output 900 ml  Net -900 ml   General: well-appearing, no acute distress HEENT: anicteric sclera, oropharynx clear without lesions, left eye blind CV: Normal rate, no obvious murmur Lungs: Crackles bilaterally, minimal increased work of breathing Abd: soft, non-tender, non-distended Skin: no visible lesions or rashes Psych:  alert, engaged, appropriate mood and  affect Musculoskeletal: no obvious deformities Neuro: normal speech, no gross focal deficits  Access: Brachiocephalic aVF with palpable bruit on right arm  Test Results Reviewed Lab Results  Component Value Date   NA 140 05/24/2020   K 6.2 (H) 05/24/2020   CL 108 05/24/2020   CO2 17 (L) 05/24/2020   BUN 97 (H) 05/24/2020   CREATININE 8.69 (H) 05/24/2020   CALCIUM 8.9 05/24/2020   ALBUMIN 2.5 (L) 05/24/2020   PHOS 6.2 (H) 05/24/2020     I have reviewed all relevant outside healthcare records related to the patient's kidney injury.

## 2020-05-24 NOTE — ED Notes (Signed)
Update with family member,Brother.

## 2020-05-24 NOTE — ED Notes (Signed)
Pt's CBG result 203. Informed Luellen Pucker - RN.

## 2020-05-24 NOTE — Progress Notes (Signed)
  Echocardiogram 2D Echocardiogram has been performed.  Edward [name] 05/24/2020, 9:11 AM

## 2020-05-24 NOTE — Progress Notes (Signed)
Family Medicine Teaching Service Daily Progress Note Intern Pager: [phone]  Patient name: Edward YOSHINO Sr. Medical record number: [id number] Date of birth: [birth year] Age: 49 y.o. Gender: male  Primary Care Provider: [name], MD Consultants:None (Curbsided Nephro and Cards at presentation) Code Status: Full  Pt Overview and Major Events to Date:  8/21 admitted  [name] is a 49 y.o. male who presented with shortness of breath secondary to Covid19 pneumonia. 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  Acute hypoxic respiratory failure secondary to Covid pneumonia: Patient continues to have SOB and is currently satting > 90% on 5 L nasal cannula.  D-dimer elevated at 13.24, Mg 1.9, Phos elevated at 6.2.  WBCs this a.m. 7.1.  K elevated at 6.2 and Na 140.  Pro-Cal WNL. VBG pH 7.4, PCO2 24.1, HCO3 15, PO2 59.  On exam patient continues have faint inspiratory crackles diffusely .  Patient provided diet as he appears stable in terms of respiratory status at this time. -Continuous pulse ox -Continuous cardiac monitoring -F/U ferritin, for COVID-19 patient -A.m. CBC/CMP -Follow-up blood cultures,  -Maintain airborne and contact precautions - Discontinuing cont. mIVF 100cc/hr normal saline -Decadron 1/6 Remdesivir 2/5  HFpEF Patient scheduled for echo today, 8/22.  Patient continues to have bilateral nonpitting edema in LE.  Patient appears euvolemic at this time.   Did receive MIVF at admission but discontinued at this time.  Unable to verify home medications but per chart review they seem to include torsemide 100 mg twice daily, as well as Coreg and hydralazine for blood pressure control. -Holding home medication until can verify they are correct or otherwise instructed by cards -Monitor fluid status -Continuous cardiac monitoring/continuous  pulse ox -Echo today, 8/22  Pericardial effusion: Pericardial Effusion appreciated on bedside Echo without signs of tamponade per ED provider.  Patient scheduled for echo this a.m.  there is concern for uremic pericarditis.  K this a.m. 6.2. Cr 9.05>8.69 s/p fluids. Curbsided cardiology:  Per cardiology fellow recommends it is okay to provide some fluid tonight as long as the patient does not appear grossly volume overloaded on exam, which he does not, but that should the patient become hypotensive overnight or during the day tomorrow we should become very concerned for tamponade due to the pericardial effusion.    Curbside with cardiology fellow agreed with avoiding diuresis at this point, and that this case should be reconsidered if the patient worsens after getting a small amount of IV fluid.  Current BP 158/100.  Range BP 119/79-165/90. -Continuous cardiac monitor -Transthoracic echo  today, 8/22 -Continuous pulse ox  CKD5:  Cr 9.05> 8.69.  K this a.m. 6.2.  Patient alert and oriented to person, place, and situation but not time.  Per patient's family patient has had acute cognitive decline since being diagnosed with COVID.  Although this could be attributed to COVID diagnoses versus noncompliance with hypothyroid medication versus uremic encephalopathy.  Patient has fistula access that has not yet been used.  Most recent prior Cr of 6.77 in Nov 2020 and 4.01 in Nov 2019. Curb-sided nephrology at admission who recommended continued monitoring and that could do small amount of fluid if needed for patient's heart rate etc. HR this a.m. 73.  Patient provided renal diet.  Concern that patient could be developing uremic pericarditis and/or uremic encephalopathy.  Cognitive function may be waxing and waning.  -AM CMP  to monitor renal function - discontinue 100 cc/hour normal saline  -Consult nephro -Continue to monitor cognitive function  Type 2 diabetes:  HgbA1c at admission 6.7. Unable to verify  home medications but per chart review they appear to be glipizide 2.5 mg/day. - Very sensitive sliding scale insulin  Hypothyroidism:  2/2 RAI therapy of Graves disease.  Home medications per chart review Synthroid 137 mcg/day.  TSH at admission 35.  Patient appears to have slower cognitive ability per family this started over the past 2 weeks in conjunction with COVID.  It is likely patient has not been taking his Synthroid causing this "brain fog".  Patient not oriented to time a.m.  Appears to be improving slowly in terms of cognitive function. -Continue home Synthroid  Hypertension: Blood pressure this a.m. 158/100. Unable to confirm patient's home blood pressure medication, though per chart review he is listed as taking hydralazine twice per day, torsemide 100 mg twice per day, and Coreg.   Anemia Hemoglobin 10.9> 11.1.  Per chart review baseline appears to be between 9 and 11.  Most likely anemia of chronic disease.  No current signs of bleeding. -A.m. CBC as above  FEN/GI:  Renal diet with carb modifications Prophylaxis: Heparin  Disposition: Med-tele  Subjective:   Patient resting in bed.  Patient is requesting his diet.  No complaints of SOB or chest pain this a.m.  Objective: Temp:  [98.1 F (36.7 C)-101.3 F (38.5 C)] 98.1 F (36.7 C) (08/22 0204) Pulse Rate:  [73-118] 73 (08/22 0735) Resp:  [17-40] 23 (08/22 0735) BP: (119-165)/(74-100) 158/100 (08/22 0730) SpO2:  [83 %-98 %] 97 % (08/22 0735) FiO2 (%):  [100 %] 100 % (08/22 0159) Weight:  [113.4 kg] 113.4 kg (08/21 1635) Physical Exam: General: NAD, depressed mood and affect, alert and oriented to person place and situation.  Not oriented to time Cardiovascular: RRR, no murmur detected Respiratory: Slight inspiratory crackles diffusely, no increased work of breathing noted, SOB with minor exertion. Abdomen: Normoactive bowel sounds, no pain to palpation Extremities: Nonpitting edema BLE, distal pulses  intact  Laboratory: Recent Labs  Lab 05/23/20 1636 05/23/20 1948 05/24/20 0652  WBC 7.9  --  7.1  HGB 11.9* 10.9* 11.3*  HCT 38.3* 32.0* 36.5*  PLT 268  --  246   Recent Labs  Lab 05/23/20 1636 05/23/20 1948  NA 140 139  K 5.1 4.6  CL 108  --   CO2 15*  --   BUN 96*  --   CREATININE 9.05*  --   CALCIUM 8.8*  --   PROT 7.5  --   BILITOT 0.9  --   ALKPHOS 57  --   ALT 34  --   AST 60*  --   GLUCOSE 175*  --       Imaging/Diagnostic Tests: DG Chest Portable 1 View  Result Date: 05/23/2020 CLINICAL DATA:  Hypoxia.  Positive COVID. EXAM: PORTABLE CHEST 1 VIEW COMPARISON:  03/21/2018 FINDINGS: The cardiopericardial silhouette appears significantly enlarged when compared to 2019 and may represent cardiomegaly and/or pericardial effusion. Bilateral LOWER lung hazy opacities are noted. No pleural effusion or pneumothorax. No acute bony abnormalities are present. IMPRESSION: 1. Significant enlargement of the cardiopericardial silhouette which may represent cardiomegaly and/or pericardial effusion. 2. Bilateral LOWER lung hazy opacities suspicious for infection/pneumonia. Electronically Signed   By: Margarette Canada M.D.   On: 05/23/2020 17:04    [name], MD 05/24/2020, 7:47 AM PGY-1, [facility] Intern pager: [phone],  text pages welcome

## 2020-05-25 ENCOUNTER — Other Ambulatory Visit: Payer: Self-pay

## 2020-05-25 DIAGNOSIS — I120 Hypertensive chronic kidney disease with stage 5 chronic kidney disease or end stage renal disease: Secondary | ICD-10-CM

## 2020-05-25 DIAGNOSIS — E1122 Type 2 diabetes mellitus with diabetic chronic kidney disease: Secondary | ICD-10-CM

## 2020-05-25 LAB — COMPREHENSIVE METABOLIC PANEL
ALT: 35 U/L (ref 0–44)
AST: 48 U/L — ABNORMAL HIGH (ref 15–41)
Albumin: 2.5 g/dL — ABNORMAL LOW (ref 3.5–5.0)
Alkaline Phosphatase: 64 U/L (ref 38–126)
Anion gap: 17 — ABNORMAL HIGH (ref 5–15)
BUN: 107 mg/dL — ABNORMAL HIGH (ref 6–20)
CO2: 16 mmol/L — ABNORMAL LOW (ref 22–32)
Calcium: 9 mg/dL (ref 8.9–10.3)
Chloride: 106 mmol/L (ref 98–111)
Creatinine, Ser: 8.1 mg/dL — ABNORMAL HIGH (ref 0.61–1.24)
GFR calc Af Amer: 8 mL/min — ABNORMAL LOW (ref >60)
GFR calc non Af Amer: 7 mL/min — ABNORMAL LOW (ref >60)
Glucose, Bld: 303 mg/dL — ABNORMAL HIGH (ref 70–99)
Potassium: 5.6 mmol/L — ABNORMAL HIGH (ref 3.5–5.1)
Sodium: 139 mmol/L (ref 135–145)
Total Bilirubin: 0.7 mg/dL (ref 0.3–1.2)
Total Protein: 7.2 g/dL (ref 6.5–8.1)

## 2020-05-25 LAB — BASIC METABOLIC PANEL
Anion gap: 14 (ref 5–15)
BUN: 96 mg/dL — ABNORMAL HIGH (ref 6–20)
CO2: 17 mmol/L — ABNORMAL LOW (ref 22–32)
Calcium: 8.3 mg/dL — ABNORMAL LOW (ref 8.9–10.3)
Chloride: 107 mmol/L (ref 98–111)
Creatinine, Ser: 6.95 mg/dL — ABNORMAL HIGH (ref 0.61–1.24)
GFR calc Af Amer: 10 mL/min — ABNORMAL LOW (ref >60)
GFR calc non Af Amer: 8 mL/min — ABNORMAL LOW (ref >60)
Glucose, Bld: 246 mg/dL — ABNORMAL HIGH (ref 70–99)
Potassium: 4.9 mmol/L (ref 3.5–5.1)
Sodium: 138 mmol/L (ref 135–145)

## 2020-05-25 LAB — CBC WITH DIFFERENTIAL/PLATELET
Abs Immature Granulocytes: 0.5 10*3/uL — ABNORMAL HIGH (ref 0.00–0.07)
Basophils Absolute: 0 10*3/uL (ref 0.0–0.1)
Basophils Relative: 0 %
Eosinophils Absolute: 0 10*3/uL (ref 0.0–0.5)
Eosinophils Relative: 0 %
HCT: 41.3 % (ref 39.0–52.0)
Hemoglobin: 12.9 g/dL — ABNORMAL LOW (ref 13.0–17.0)
Immature Granulocytes: 4 %
Lymphocytes Relative: 6 %
Lymphs Abs: 0.7 10*3/uL (ref 0.7–4.0)
MCH: 31.4 pg (ref 26.0–34.0)
MCHC: 31.2 g/dL (ref 30.0–36.0)
MCV: 100.5 fL — ABNORMAL HIGH (ref 80.0–100.0)
Monocytes Absolute: 0.4 10*3/uL (ref 0.1–1.0)
Monocytes Relative: 3 %
Neutro Abs: 11 10*3/uL — ABNORMAL HIGH (ref 1.7–7.7)
Neutrophils Relative %: 87 %
Platelets: 307 10*3/uL (ref 150–400)
RBC: 4.11 MIL/uL — ABNORMAL LOW (ref 4.22–5.81)
RDW: 15.9 % — ABNORMAL HIGH (ref 11.5–15.5)
WBC: 12.6 10*3/uL — ABNORMAL HIGH (ref 4.0–10.5)
nRBC: 0.5 % — ABNORMAL HIGH (ref 0.0–0.2)

## 2020-05-25 LAB — CBG MONITORING, ED
Glucose-Capillary: 277 mg/dL — ABNORMAL HIGH (ref 70–99)
Glucose-Capillary: 269 mg/dL — ABNORMAL HIGH (ref 70–99)
Glucose-Capillary: 246 mg/dL — ABNORMAL HIGH (ref 70–99)

## 2020-05-25 LAB — C-REACTIVE PROTEIN: CRP: 5.5 mg/dL — ABNORMAL HIGH (ref <1.0)

## 2020-05-25 LAB — D-DIMER, QUANTITATIVE: D-Dimer, Quant: 5.54 ug/mL-FEU — ABNORMAL HIGH (ref 0.00–0.50)

## 2020-05-25 LAB — HEPATITIS B SURFACE ANTIGEN: Hepatitis B Surface Ag: NONREACTIVE

## 2020-05-25 LAB — FERRITIN: Ferritin: 4773 ng/mL — ABNORMAL HIGH (ref 24–336)

## 2020-05-25 LAB — GLUCOSE, CAPILLARY
Glucose-Capillary: 211 mg/dL — ABNORMAL HIGH (ref 70–99)
Glucose-Capillary: 246 mg/dL — ABNORMAL HIGH (ref 70–99)

## 2020-05-25 LAB — MAGNESIUM: Magnesium: 2.1 mg/dL (ref 1.7–2.4)

## 2020-05-25 LAB — HEPATITIS B CORE ANTIBODY, TOTAL: Hep B Core Total Ab: NONREACTIVE

## 2020-05-25 LAB — PHOSPHORUS: Phosphorus: 5.9 mg/dL — ABNORMAL HIGH (ref 2.5–4.6)

## 2020-05-25 LAB — HEPATITIS B SURFACE ANTIBODY,QUALITATIVE: Hep B S Ab: NONREACTIVE

## 2020-05-25 MED ORDER — SEVELAMER CARBONATE 800 MG PO TABS
800.0000 mg | ORAL_TABLET | Freq: Three times a day (TID) | ORAL | Status: DC
  Administered 2020-05-25 – 2020-05-29 (×9): 800 mg via ORAL
  Filled 2020-05-25 (×10): qty 1

## 2020-05-25 MED ORDER — FUROSEMIDE 10 MG/ML IJ SOLN: 80.0000 mg | Freq: Two times a day (BID) | INTRAMUSCULAR | Status: DC

## 2020-05-25 NOTE — ED Notes (Signed)
Ordered breakfast--Edward [name] 

## 2020-05-25 NOTE — ED Notes (Signed)
Attempted to call report x1, nurse will call back

## 2020-05-25 NOTE — Progress Notes (Signed)
Inpatient Diabetes Program Recommendations  AACE/ADA: New Consensus Statement on Inpatient Glycemic Control (2015)  Target Ranges:  Prepandial:   less than 140 mg/dL      Peak postprandial:   less than 180 mg/dL (1-2 hours)      Critically ill patients:  140 - 180 mg/dL   Lab Results  Component Value Date   GLUCAP 246 (H) 05/25/2020   HGBA1C 6.7 (H) 05/24/2020    Review of Glycemic Control Results for Edward [name], Edward SR. (MRN [id number]) as of 05/25/2020 15:53  Ref. Range 05/24/2020 12:24 05/24/2020 17:30 05/24/2020 22:05 05/25/2020 08:18 05/25/2020 11:58  Glucose-Capillary Latest Ref Range: 70 - 99 mg/dL 192 (H) 231 (H) 277 (H) 269 (H) 246 (H)   Diabetes history:  DM2  Outpatient Diabetes medications:  Glipizide 2.5 mg daily   Current orders for Inpatient glycemic control:  Novolog 0-6 units tid Decadron 6 mg daily  Inpatient Diabetes Program Recommendations:    If steroids continue, please consider,  Levemir 8 units bid Novolog 2 units meal coverage if eats at least 50% of meal  Will continue to follow while inpatient.  Thank you, Reche Dixon, RN, BSN Diabetes Coordinator Inpatient Diabetes Program [phone] (team pager from 8a-5p)

## 2020-05-25 NOTE — Evaluation (Signed)
Physical Therapy Evaluation Patient Details Name: Edward SAGRAVES Sr. MRN: [id number] DOB: 12/18/1970 Today's Date: 05/25/2020   History of Present Illness  Pt is a 49 y/o male admitted secondary to increased confusion. Covid + with PNA. Also with renal failure and will likely be starting HD. PMH includes CKD, DM, HTN, and blindness.   Clinical Impression  Pt admitted secondary to problem above with deficits below. Pt with slow processing. Also with increased fatigue and poor activity tolerance. Pt stood at EOB and sats decreased to 89% on 4L. Increased to 92-93% on 4L with seated rest. Pt currently lives alone and reports sons only available to help intermittently. Unsure of what assist he will have at d/c. Will continue to follow and updated recommendations based on pt progression.     Follow Up Recommendations Other (comment) (TBD pending progression )    Equipment Recommendations  Other (comment) (TBD)    Recommendations for Other Services       Precautions / Restrictions Precautions Precautions: Fall;Other (comment) Precaution Comments: watch O2 sats; pt blind Restrictions Weight Bearing Restrictions: No      Mobility  Bed Mobility Overal bed mobility: Needs Assistance Bed Mobility: Supine to Sit     Supine to sit: Min assist     General bed mobility comments: Min A for trunk elvation.   Transfers Overall transfer level: Needs assistance Equipment used: 2 person hand held assist Transfers: Sit to/from Stand Sit to Stand: Min assist         General transfer comment: Min A For lift assist and steadying to stand. Pt oxygen sats decreasing to 89% on 4L and noted SOB so returned to sitting. Returned to 92-93% on 4L with seated rest.   Ambulation/Gait                Stairs            Wheelchair Mobility    Modified Rankin (Stroke Patients Only)       Balance Overall balance assessment: Needs assistance Sitting-balance support: No upper  extremity supported;Feet supported Sitting balance-Leahy Scale: Fair     Standing balance support: Bilateral upper extremity supported;During functional activity Standing balance-Leahy Scale: Poor Standing balance comment: Reliant on BUE HHA                              Pertinent Vitals/Pain Pain Assessment: No/denies pain    Home Living Family/patient expects to be discharged to:: Private residence Living Arrangements: Alone Available Help at Discharge: Other (Comment) (reports sometimes kids are available) Type of Home: House Home Access: Stairs to enter Entrance Stairs-Rails: Right;Left;Can reach both Entrance Stairs-Number of Steps: 6 Home Layout: Two level;Able to live on main level with bedroom/bathroom Home Equipment: None      Prior Function Level of Independence: Independent               Hand Dominance        Extremity/Trunk Assessment   Upper Extremity Assessment Upper Extremity Assessment: Defer to OT evaluation    Lower Extremity Assessment Lower Extremity Assessment: Generalized weakness       Communication   Communication: No difficulties  Cognition Arousal/Alertness: Awake/alert Behavior During Therapy: Flat affect Overall Cognitive Status: No family/caregiver present to determine baseline cognitive functioning  General Comments: Pt very slow to respond to questions. Was able to answer A and O questions appropriately.       General Comments      Exercises     Assessment/Plan    PT Assessment Patient needs continued PT services  PT Problem List Decreased strength;Decreased balance;Decreased mobility;Decreased knowledge of precautions;Decreased safety awareness;Decreased cognition;Decreased knowledge of use of DME;Decreased activity tolerance       PT Treatment Interventions Gait training;DME instruction;Therapeutic activities;Functional mobility training;Stair  training;Balance training;Therapeutic exercise;Patient/family education    PT Goals (Current goals can be found in the Care Plan section)  Acute Rehab PT Goals Patient Stated Goal: none stated PT Goal Formulation: With patient Time For Goal Achievement: 06/08/20 Potential to Achieve Goals: Good    Frequency Min 3X/week   Barriers to discharge Decreased caregiver support      Co-evaluation               AM-PAC PT "6 Clicks" Mobility  Outcome Measure Help needed turning from your back to your side while in a flat bed without using bedrails?: A Little Help needed moving from lying on your back to sitting on the side of a flat bed without using bedrails?: A Little Help needed moving to and from a bed to a chair (including a wheelchair)?: A Little Help needed standing up from a chair using your arms (e.g., wheelchair or bedside chair)?: A Little Help needed to walk in hospital room?: A Lot Help needed climbing 3-5 steps with a railing? : Total 6 Click Score: 15    End of Session   Activity Tolerance: Treatment limited secondary to medical complications (Comment);Patient limited by fatigue (decreased oxygen sats) Patient left: in bed;with call bell/phone within reach (sitting EOB ) Nurse Communication: Mobility status PT Visit Diagnosis: Unsteadiness on feet (R26.81);Muscle weakness (generalized) (M62.81);Difficulty in walking, not elsewhere classified (R26.2)    Time: 6286-3817 PT Time Calculation (min) (ACUTE ONLY): 35 min   Charges:   PT Evaluation $PT Eval Moderate Complexity: 1 Mod PT Treatments $Therapeutic Activity: 8-22 mins        Lou Miner, DPT  Acute Rehabilitation Services  Pager: [phone] Office: [phone]   Rudean Hitt 05/25/2020, 7:00 PM

## 2020-05-25 NOTE — Progress Notes (Addendum)
Family Medicine Teaching Service Daily Progress Note Intern Pager: [phone]  Patient name: Edward MATHENY Sr. Medical record number: [id number] Date of birth: July 28, 1971 Age: 50 y.o. Gender: male  Primary Care Provider: [name], MD Consultants: Nephro (cards curbsided at presentation) Code Status: FULL  Pt Overview and Major Events to Date:  8/21 admitted 8/22 Echo  Assessment and Plan: Edward Norrisis a 49 y.o.male who presented with shortness of breath secondary to [place]. 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  Acute hypoxic respiratory failure secondary to Covid pneumonia: Patient does not continues to have SOB and is currently satting > 92% on 4 L nasal cannula.   WBCs this a.m. 12.6 significant elevation from 7.1 yesterday, patient is on decadron.  K elevated at 5.6  and Na 139.    On exam patient continues have faint inspiratory crackles diffusely . D-dimer 5.54. Patient provided diet as he appears stable in terms of respiratory status at this time. -Continuous pulse ox -Continuous cardiac monitoring -F/U ferritin, for COVID-19 patient -A.m. CBC/CMP -Follow-up blood cultures,  -Maintain airborne and contact precautions - Discontinuing cont. mIVF 100cc/hrnormal saline -Decadron 2/6 -Remdesivir 3/5 -PT/OT eval  HFrEF-EF 50-55% Patient received echo 8/22.  Patient continues to have bilateral nonpitting edema in LE.  Patient appears euvolemic at this time. Unable to verify home medications but per chart review they seem to include torsemide 100 mg twice daily, as well as Coreg and hydralazine for blood pressure control. -Holding home medication until can verify they are correct or otherwise instructed by cards -Monitor fluid status -Continuous cardiac monitoring/continuous pulse ox  Pericardial effusion:   Current  BP 128/66. Range BP 105/55-158/100. Echo remains unchanged from previous echo. Patient continues to have half of breath with EF 50- 55%. There is no evidence of tamponade at this time. Patient continues to have moderately sized circumferential pericardial effusion. -Continuous cardiac monitor -Continuous pulse ox  CKD5: Cr 9.05> 8.69.  K this a.m. 5.6. Mg2.1 Phos 5.9. Patient continues to have hypoalbuminemia, 2.5.   Per patient's family patient has had acute cognitive decline since being diagnosed with COVID.  Although this could be attributed to COVID diagnoses versus noncompliance with hypothyroid medication versus uremic encephalopathy.   Is nonoliguric. Nephro has been consulted and patient will go to HD today. Patient was unaware of this, nephro will see patient before he starts.  Concern that patient could be developing uremic encephalopathy.   -AM CMP to monitor renal function -Scheduled for HD today, nephro will come see patient before going -Consult nephro, recs appreciated -Continue to monitor cognitive function  Acute metabolic encephalopathy  Concern that patient could be developing uremic encephalopathy versus post ablative hypothyroidism being untreated. TSH 35 at presentation. Patient may also just be suffering from COVID pneumonia infection with hypoxemia however patient has been satting greater than 92% on 4 to 5 L [place], making this less likely. -Continue Synthroid - HD today 8/23  Type 2 diabetes:  HgbA1c at admission 6.7. CBG this AM 269. Unable to verify home medications but per chart review they appear to be glipizide 2.5 mg/day. - Very sensitive sliding scale insulin - CBGs  Hypothyroidism:  2/2 RAI therapy ofGraves disease.Home medications per chart review Synthroid 137 mcg/day.  TSH at admission 35.  Patient appears to have slower cognitive ability per family this started over the past 2 weeks in conjunction with COVID.   Appears to be  improving slowly in terms of  cognitive function. -Continue home Synthroid  Hypertension: Blood pressure this a.m. 128/66.Unable to confirm patient's home blood pressure medication, though per chart review he is listed as taking hydralazine twice per day, torsemide 100 mg twice per day, and Coreg.   Anemia Hemoglobin 10.9> 11.1. Per chart review baseline appears to be between 9 and 11. Most likely anemia of chronic disease. No current signs of bleeding. -A.m. CBC as above  FEN/GI: Renal diet with carb modifications Prophylaxis:Heparin  Disposition:Med-tele  Subjective:  No overnight events.  Patient does not report chest pain or new shortness of breath this a.m. he remains on 4 to 5 L [place].  Patient does report left antecubital fossa pain at the site of his IV with movement.  Objective: Temp:  [97.7 F (36.5 C)] 97.7 F (36.5 C) (08/23 0728) Pulse Rate:  [25-101] 80 (08/23 0645) Resp:  [18-31] 31 (08/23 0645) BP: (105-147)/(55-97) 128/66 (08/23 0645) SpO2:  [89 %-96 %] 96 % (08/23 0645) Physical Exam: General: Resting in bed, NAD appropriate mood and affect Cardiovascular: RRR, no murmur detected Respiratory: Faint inspiratory crackles in bibasilar lobes, good chest expansion, intermittent cough Abdomen: Normoactive bowel sounds, no pain to palpation Extremities: Nonpitting edema BLE, distal pulses intact  Laboratory: Recent Labs  Lab 05/23/20 1636 05/23/20 1636 05/23/20 1948 05/24/20 0652 05/25/20 0716  WBC 7.9  --   --  7.1 12.6*  HGB 11.9*   < > 10.9* 11.3* 12.9*  HCT 38.3*   < > 32.0* 36.5* 41.3  PLT 268  --   --  246 307   < > = values in this interval not displayed.   Recent Labs  Lab 05/23/20 1636 05/23/20 1636 05/23/20 1948 05/24/20 0652 05/25/20 0716  NA 140   < > 139 140 139  K 5.1   < > 4.6 6.2* 5.6*  CL 108  --   --  108 106  CO2 15*  --   --  17* 16*  BUN 96*  --   --  97* 107*  CREATININE 9.05*  --   --  8.69* 8.10*  CALCIUM 8.8*  --   --  8.9 9.0  PROT 7.5   --   --  7.0 7.2  BILITOT 0.9  --   --  0.7 0.7  ALKPHOS 57  --   --  55 64  ALT 34  --   --  32 35  AST 60*  --   --  50* 48*  GLUCOSE 175*  --   --  233* 303*   < > = values in this interval not displayed.      Imaging/Diagnostic Tests: ECHOCARDIOGRAM COMPLETE  Result Date: 05/24/2020    ECHOCARDIOGRAM REPORT   Patient Name:   Edward VENARD Sr. Date of Exam: 05/24/2020 Medical Rec #:  [id number]                Height:       72.0 in Accession #:    [id number]               Weight:       250.0 lb Date of Birth:  [birth year]                BSA:          2.343 m Patient Age:    43 years                 BP:  158/102 mmHg Patient Gender: M                        HR:           88 bpm. Exam Location:  Inpatient Procedure: 2D Echo, Color Doppler and Cardiac Doppler Indications:    Pericardial effusion  History:        Patient has prior history of Echocardiogram examinations, most                 recent 09/05/2018. CHF; Risk Factors:Former Smoker, Diabetes and                 Hypertension. CKD. COVID-19.  Sonographer:    Clayton Lefort RDCS (AE) Referring Phys: [phone] [facility]  Sonographer Comments: COVID-19. IMPRESSIONS  1. There is moderate circumferential pericardial effusion with maximum diameter 1.7 cm. There is > 25% variability in tricuspid valve inflow velocities and right atrial invagination consistent with pre tamponade physiology. Repeat limited echocardiogram  in 24-48 hours.  2. Left ventricular ejection fraction, by estimation, is 50 to 55%. The left ventricle has low normal function. The left ventricle demonstrates global hypokinesis. There is severe concentric left ventricular hypertrophy. Left ventricular diastolic parameters are consistent with Grade I diastolic dysfunction (impaired relaxation).  3. Right ventricular systolic function is normal. The right ventricular size is normal.  4. Moderate pericardial effusion. The pericardial effusion is circumferential. There is no  evidence of cardiac tamponade.  5. The mitral valve is normal in structure. Mild mitral valve regurgitation. No evidence of mitral stenosis.  6. The aortic valve is normal in structure. Aortic valve regurgitation is trivial. No aortic stenosis is present.  7. The inferior vena cava is normal in size with greater than 50% respiratory variability, suggesting right atrial pressure of 3 mmHg. FINDINGS  Left Ventricle: Left ventricular ejection fraction, by estimation, is 50 to 55%. The left ventricle has low normal function. The left ventricle demonstrates global hypokinesis. The left ventricular internal cavity size was normal in size. There is severe concentric left ventricular hypertrophy. Left ventricular diastolic function could not be evaluated due to nondiagnostic images. Left ventricular diastolic parameters are consistent with Grade I diastolic dysfunction (impaired relaxation). Right Ventricle: The right ventricular size is normal. No increase in right ventricular wall thickness. Right ventricular systolic function is normal. Left Atrium: Left atrial size was normal in size. Right Atrium: Right atrial size was normal in size. Pericardium: A moderately sized pericardial effusion is present. The pericardial effusion is circumferential. There is no evidence of cardiac tamponade. Mitral Valve: The mitral valve is normal in structure. Normal mobility of the mitral valve leaflets. Mild mitral valve regurgitation. No evidence of mitral valve stenosis. Tricuspid Valve: The tricuspid valve is normal in structure. Tricuspid valve regurgitation is mild . No evidence of tricuspid stenosis. Aortic Valve: The aortic valve is normal in structure. Aortic valve regurgitation is trivial. No aortic stenosis is present. Aortic valve mean gradient measures 2.0 mmHg. Aortic valve peak gradient measures 3.9 mmHg. Aortic valve area, by VTI measures 3.60 cm. Pulmonic Valve: The pulmonic valve was normal in structure. Pulmonic valve  regurgitation is not visualized. No evidence of pulmonic stenosis. Aorta: The aortic root is normal in size and structure. Venous: The inferior vena cava is normal in size with greater than 50% respiratory variability, suggesting right atrial pressure of 3 mmHg. IAS/Shunts: No atrial level shunt detected by color flow Doppler.  LEFT VENTRICLE PLAX 2D LVIDd:  4.80 cm LVIDs:         3.60 cm LV PW:         1.80 cm LV IVS:        1.90 cm LVOT diam:     2.30 cm LV SV:         67 LV SV Index:   29 LVOT Area:     4.15 cm  RIGHT VENTRICLE             IVC RV Basal diam:  3.30 cm     IVC diam: 1.60 cm RV S prime:     10.20 cm/s TAPSE (M-mode): 1.3 cm LEFT ATRIUM             Index       RIGHT ATRIUM           Index LA diam:        3.90 cm 1.66 cm/m  RA Area:     21.00 cm LA Vol (A2C):   69.0 ml 29.46 ml/m RA Volume:   60.30 ml  25.74 ml/m LA Vol (A4C):   56.0 ml 23.91 ml/m LA Biplane Vol: 65.0 ml 27.75 ml/m  AORTIC VALVE AV Area (Vmax):    3.60 cm AV Area (Vmean):   3.29 cm AV Area (VTI):     3.60 cm AV Vmax:           98.80 cm/s AV Vmean:          71.700 cm/s AV VTI:            0.186 m AV Peak Grad:      3.9 mmHg AV Mean Grad:      2.0 mmHg LVOT Vmax:         85.50 cm/s LVOT Vmean:        56.700 cm/s LVOT VTI:          0.161 m LVOT/AV VTI ratio: 0.87  AORTA Ao Root diam: 3.60 cm  SHUNTS Systemic VTI:  0.16 m Systemic Diam: 2.30 cm Ena Dawley MD Electronically signed by Ena Dawley MD Signature Date/Time: 05/24/2020/12:56:51 PM    Final     [name], MD 05/25/2020, 8:08 AM PGY-1, [place] Intern pager: [phone], text pages welcome

## 2020-05-25 NOTE — ED Notes (Signed)
Report called to Joellen Jersey, Therapist, sports. Pt to go to 2W when finished with dialysis. Dialysis notified.

## 2020-05-25 NOTE — Progress Notes (Signed)
[facility] NEPHROLOGY PROGRESS NOTE  Assessment/ Plan: Pt is a 49 y.o. yo male with history of hypertension, diabetes, CKD 5 follows with me at [facility] is status post right brachiocephalic AV fistula created by Dr. Scot Dock on 04/22/2019, here with Covid pneumonia and worsening renal failure.  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  Plan to initiate dialysis today.  Discussed with the patient and he agreed.  If any problem with cannulation then we may need to contact vascular surgeon.  He will need arrangement for outpatient HD before discharge.  #Hyperkalemia: On Lokelma.  Now managed with dialysis.  Monitor BMP.  #Acute hypoxic respiratory failure due to COVID-19 pneumonia: Treatment per primary team.  Getting remdesivir and Decadron.  On supplemental oxygen.  #Pericardial effusion with pretamponade physiology per echo.  May need cardiology evaluation.  Denies chest pain this morning.  #Anemia due to CKD: Hemoglobin at goal.  He gets outpatient ESA injection.  #Secondary hyperparathyroidism: Elevated phosphorus level.  Start sevelamer.  #Metabolic acidosis: Plan to initiate dialysis.  Subjective: Seen and examined in ER.  Reports shortness of breath, fatigue.  Chest pain is better this morning.  Denies nausea vomiting.  Urine output is 900 cc. Objective Vital signs in last 24 hours: Vitals:   05/25/20 0600 05/25/20 0630 05/25/20 0645 05/25/20 0728  BP: 126/81  128/66   Pulse: 76 (!) 25 80   Resp: (!) 23 19 (!) 31   Temp:    97.7 F (36.5 C)  TempSrc:    Oral  SpO2: 96%  96%   Weight:      Height:       Weight change:   Intake/Output Summary (Last 24 hours) at 05/25/2020 1110 Last data filed at 05/24/2020 1310 Gross per 24 hour  Intake 113.33 ml  Output --  Net 113.33 ml       Labs: Basic Metabolic Panel: Recent Labs  Lab 05/23/20 1636 05/23/20 1636  05/23/20 1948 05/24/20 0652 05/25/20 0716  NA 140   < > 139 140 139  K 5.1   < > 4.6 6.2* 5.6*  CL 108  --   --  108 106  CO2 15*  --   --  17* 16*  GLUCOSE 175*  --   --  233* 303*  BUN 96*  --   --  97* 107*  CREATININE 9.05*  --   --  8.69* 8.10*  CALCIUM 8.8*  --   --  8.9 9.0  PHOS  --   --   --  6.2* 5.9*   < > = values in this interval not displayed.   Liver Function Tests: Recent Labs  Lab 05/23/20 1636 05/24/20 0652 05/25/20 0716  AST 60* 50* 48*  ALT 34 32 35  ALKPHOS 57 55 64  BILITOT 0.9 0.7 0.7  PROT 7.5 7.0 7.2  ALBUMIN 3.0* 2.5* 2.5*   No results for input(s): LIPASE, AMYLASE in the last 168 hours. No results for input(s): AMMONIA in the last 168 hours. CBC: Recent Labs  Lab 05/23/20 1636 05/23/20 1636 05/23/20 1948 05/24/20 0652 05/25/20 0716  WBC 7.9  --   --  7.1 12.6*  NEUTROABS 7.0  --   --  6.7 11.0*  HGB 11.9*   < > 10.9* 11.3* 12.9*  HCT 38.3*   < > 32.0* 36.5* 41.3  MCV 101.3*  --   --  100.8* 100.5*  PLT 268  --   --  246 307   < > = values in this interval not displayed.   Cardiac Enzymes: Recent Labs  Lab 05/24/20 0652  CKTOTAL 815*   CBG: Recent Labs  Lab 05/23/20 2343 05/24/20 0820 05/24/20 1224 05/24/20 1730 05/25/20 0818  GLUCAP 171* 203* 192* 231* 269*    Iron Studies:  Recent Labs    05/25/20 0716  FERRITIN 4,773*   Studies/Results: DG Chest Portable 1 View  Result Date: 05/23/2020 CLINICAL DATA:  Hypoxia.  Positive COVID. EXAM: PORTABLE CHEST 1 VIEW COMPARISON:  03/21/2018 FINDINGS: The cardiopericardial silhouette appears significantly enlarged when compared to 2019 and may represent cardiomegaly and/or pericardial effusion. Bilateral LOWER lung hazy opacities are noted. No pleural effusion or pneumothorax. No acute bony abnormalities are present. IMPRESSION: 1. Significant enlargement of the cardiopericardial silhouette which may represent cardiomegaly and/or pericardial effusion. 2. Bilateral LOWER lung hazy  opacities suspicious for infection/pneumonia. Electronically Signed   By: Margarette Canada M.D.   On: 05/23/2020 17:04   ECHOCARDIOGRAM COMPLETE  Result Date: 05/24/2020    ECHOCARDIOGRAM REPORT   Patient Name:   Edward TOWERY Sr. Date of Exam: 05/24/2020 Medical Rec #:  [id number]                Height:       72.0 in Accession #:    [id number]               Weight:       250.0 lb Date of Birth:  02/17/1971                BSA:          2.343 m Patient Age:    32 years                 BP:           158/102 mmHg Patient Gender: M                        HR:           88 bpm. Exam Location:  Inpatient Procedure: 2D Echo, Color Doppler and Cardiac Doppler Indications:    Pericardial effusion  History:        Patient has prior history of Echocardiogram examinations, most                 recent 09/05/2018. CHF; Risk Factors:Former Smoker, Diabetes and                 Hypertension. CKD. COVID-19.  Sonographer:    Clayton Lefort RDCS (AE) Referring Phys: [id number] [facility]  Sonographer Comments: COVID-19. IMPRESSIONS  1. There is moderate circumferential pericardial effusion with maximum diameter 1.7 cm. There is > 25% variability in tricuspid valve inflow velocities and right atrial invagination consistent with pre tamponade physiology. Repeat limited echocardiogram  in 24-48 hours.  2. Left ventricular ejection fraction, by estimation, is 50 to 55%. The left ventricle has low normal function. The left ventricle demonstrates global hypokinesis. There is severe concentric left ventricular hypertrophy. Left ventricular diastolic parameters are consistent with Grade I diastolic dysfunction (impaired relaxation).  3. Right ventricular systolic function is normal. The right ventricular size is normal.  4. Moderate pericardial effusion. The pericardial effusion is circumferential. There is no evidence of cardiac tamponade.  5. The mitral valve is normal in structure. Mild mitral valve regurgitation. No evidence of mitral  stenosis.  6. The aortic valve is normal in structure. Aortic valve regurgitation is trivial. No aortic stenosis is present.  7. The inferior vena cava is normal in size with greater than 50% respiratory variability, suggesting right atrial pressure of 3 mmHg. FINDINGS  Left Ventricle: Left ventricular ejection fraction, by estimation, is 50 to 55%. The left ventricle has low normal function. The left ventricle demonstrates global hypokinesis. The left ventricular internal cavity size was normal in size. There is severe concentric left ventricular hypertrophy. Left ventricular diastolic function could not be evaluated due to nondiagnostic images. Left ventricular diastolic parameters are consistent with Grade I diastolic dysfunction (impaired relaxation). Right Ventricle: The right ventricular size is normal. No increase in right ventricular wall thickness. Right ventricular systolic function is normal. Left Atrium: Left atrial size was normal in size. Right Atrium: Right atrial size was normal in size. Pericardium: A moderately sized pericardial effusion is present. The pericardial effusion is circumferential. There is no evidence of cardiac tamponade. Mitral Valve: The mitral valve is normal in structure. Normal mobility of the mitral valve leaflets. Mild mitral valve regurgitation. No evidence of mitral valve stenosis. Tricuspid Valve: The tricuspid valve is normal in structure. Tricuspid valve regurgitation is mild . No evidence of tricuspid stenosis. Aortic Valve: The aortic valve is normal in structure. Aortic valve regurgitation is trivial. No aortic stenosis is present. Aortic valve mean gradient measures 2.0 mmHg. Aortic valve peak gradient measures 3.9 mmHg. Aortic valve area, by VTI measures 3.60 cm. Pulmonic Valve: The pulmonic valve was normal in structure. Pulmonic valve regurgitation is not visualized. No evidence of pulmonic stenosis. Aorta: The aortic root is normal in size and structure. Venous:  The inferior vena cava is normal in size with greater than 50% respiratory variability, suggesting right atrial pressure of 3 mmHg. IAS/Shunts: No atrial level shunt detected by color flow Doppler.  LEFT VENTRICLE PLAX 2D LVIDd:         4.80 cm LVIDs:         3.60 cm LV PW:         1.80 cm LV IVS:        1.90 cm LVOT diam:     2.30 cm LV SV:         67 LV SV Index:   29 LVOT Area:     4.15 cm  RIGHT VENTRICLE             IVC RV Basal diam:  3.30 cm     IVC diam: 1.60 cm RV S prime:     10.20 cm/s TAPSE (M-mode): 1.3 cm LEFT ATRIUM             Index       RIGHT ATRIUM           Index LA diam:        3.90 cm 1.66 cm/m  RA Area:     21.00 cm LA Vol (A2C):   69.0 ml 29.46 ml/m RA Volume:   60.30 ml  25.74 ml/m LA Vol (A4C):   56.0 ml 23.91 ml/m LA Biplane Vol: 65.0 ml 27.75 ml/m  AORTIC VALVE AV Area (Vmax):    3.60 cm AV Area (Vmean):   3.29 cm AV Area (VTI):     3.60 cm AV Vmax:           98.80 cm/s AV Vmean:          71.700 cm/s AV VTI:  0.186 m AV Peak Grad:      3.9 mmHg AV Mean Grad:      2.0 mmHg LVOT Vmax:         85.50 cm/s LVOT Vmean:        56.700 cm/s LVOT VTI:          0.161 m LVOT/AV VTI ratio: 0.87  AORTA Ao Root diam: 3.60 cm  SHUNTS Systemic VTI:  0.16 m Systemic Diam: 2.30 cm Ena Dawley MD Electronically signed by Ena Dawley MD Signature Date/Time: 05/24/2020/12:56:51 PM    Final     Medications: Infusions: . remdesivir 100 mg in NS 100 mL 100 mg (05/25/20 1055)    Scheduled Medications: . Chlorhexidine Gluconate Cloth  6 each Topical Q0600  . dexamethasone (DECADRON) injection  6 mg Intravenous Q0600  . heparin  5,000 Units Subcutaneous Q8H  . insulin aspart  0-6 Units Subcutaneous TID WC  . levothyroxine  137 mcg Oral Q0600  . sodium bicarbonate  1,300 mg Oral TID    have reviewed scheduled and prn medications.  Physical Exam: General:NAD, comfortable Heart:RRR, s1s2 nl, no rubs Lungs: Bibasal rhonchi, no increased work of breathing Abdomen:soft,  Non-tender, non-distended Extremities: Trace LE edema Dialysis Access: Right upper extremity AV fistula has good thrill and bruit  Sylvia Helms Prasad Channell Quattrone 05/25/2020,11:10 AM  LOS: 1 day  Pager: [phone]

## 2020-05-26 ENCOUNTER — Encounter (HOSPITAL_COMMUNITY): Payer: Medicare PPO

## 2020-05-26 ENCOUNTER — Inpatient Hospital Stay (HOSPITAL_COMMUNITY): Payer: Medicare PPO

## 2020-05-26 DIAGNOSIS — Z992 Dependence on renal dialysis: Secondary | ICD-10-CM

## 2020-05-26 DIAGNOSIS — N186 End stage renal disease: Secondary | ICD-10-CM

## 2020-05-26 LAB — PHOSPHORUS: Phosphorus: 4.7 mg/dL — ABNORMAL HIGH (ref 2.5–4.6)

## 2020-05-26 LAB — FERRITIN: Ferritin: 4050 ng/mL — ABNORMAL HIGH (ref 24–336)

## 2020-05-26 LAB — CBC WITH DIFFERENTIAL/PLATELET
Abs Immature Granulocytes: 0.18 10*3/uL — ABNORMAL HIGH (ref 0.00–0.07)
Basophils Absolute: 0 10*3/uL (ref 0.0–0.1)
Basophils Relative: 0 %
Eosinophils Absolute: 0 10*3/uL (ref 0.0–0.5)
Eosinophils Relative: 0 %
HCT: 38.5 % — ABNORMAL LOW (ref 39.0–52.0)
Hemoglobin: 12.7 g/dL — ABNORMAL LOW (ref 13.0–17.0)
Immature Granulocytes: 2 %
Lymphocytes Relative: 5 %
Lymphs Abs: 0.5 10*3/uL — ABNORMAL LOW (ref 0.7–4.0)
MCH: 32.2 pg (ref 26.0–34.0)
MCHC: 33 g/dL (ref 30.0–36.0)
MCV: 97.7 fL (ref 80.0–100.0)
Monocytes Absolute: 0.2 10*3/uL (ref 0.1–1.0)
Monocytes Relative: 2 %
Neutro Abs: 8.7 10*3/uL — ABNORMAL HIGH (ref 1.7–7.7)
Neutrophils Relative %: 91 %
Platelets: 326 10*3/uL (ref 150–400)
RBC: 3.94 MIL/uL — ABNORMAL LOW (ref 4.22–5.81)
RDW: 15.5 % (ref 11.5–15.5)
WBC: 9.6 10*3/uL (ref 4.0–10.5)
nRBC: 0.3 % — ABNORMAL HIGH (ref 0.0–0.2)

## 2020-05-26 LAB — COMPREHENSIVE METABOLIC PANEL
ALT: 32 U/L (ref 0–44)
AST: 37 U/L (ref 15–41)
Albumin: 2.5 g/dL — ABNORMAL LOW (ref 3.5–5.0)
Alkaline Phosphatase: 60 U/L (ref 38–126)
Anion gap: 14 (ref 5–15)
BUN: 93 mg/dL — ABNORMAL HIGH (ref 6–20)
CO2: 20 mmol/L — ABNORMAL LOW (ref 22–32)
Calcium: 8.7 mg/dL — ABNORMAL LOW (ref 8.9–10.3)
Chloride: 105 mmol/L (ref 98–111)
Creatinine, Ser: 6.94 mg/dL — ABNORMAL HIGH (ref 0.61–1.24)
GFR calc Af Amer: 10 mL/min — ABNORMAL LOW (ref >60)
GFR calc non Af Amer: 8 mL/min — ABNORMAL LOW (ref >60)
Glucose, Bld: 248 mg/dL — ABNORMAL HIGH (ref 70–99)
Potassium: 4.4 mmol/L (ref 3.5–5.1)
Sodium: 139 mmol/L (ref 135–145)
Total Bilirubin: 0.7 mg/dL (ref 0.3–1.2)
Total Protein: 7 g/dL (ref 6.5–8.1)

## 2020-05-26 LAB — C-REACTIVE PROTEIN: CRP: 3.6 mg/dL — ABNORMAL HIGH (ref <1.0)

## 2020-05-26 LAB — GLUCOSE, CAPILLARY
Glucose-Capillary: 194 mg/dL — ABNORMAL HIGH (ref 70–99)
Glucose-Capillary: 244 mg/dL — ABNORMAL HIGH (ref 70–99)
Glucose-Capillary: 309 mg/dL — ABNORMAL HIGH (ref 70–99)
Glucose-Capillary: 256 mg/dL — ABNORMAL HIGH (ref 70–99)

## 2020-05-26 LAB — MAGNESIUM: Magnesium: 2 mg/dL (ref 1.7–2.4)

## 2020-05-26 LAB — D-DIMER, QUANTITATIVE: D-Dimer, Quant: 3.92 ug/mL-FEU — ABNORMAL HIGH (ref 0.00–0.50)

## 2020-05-26 MED ORDER — CHLORHEXIDINE GLUCONATE CLOTH 2 % EX PADS: 6.0000 | MEDICATED_PAD | Freq: Every day | CUTANEOUS | Status: DC

## 2020-05-26 NOTE — Progress Notes (Signed)
[facility] NEPHROLOGY PROGRESS NOTE  Assessment/ Plan: Pt is a 49 y.o. yo male with history of hypertension, diabetes, CKD 5 follows with me at [facility] is status post right brachiocephalic AV fistula created by Dr. Scot Dock on 04/22/2019, here with Covid pneumonia and worsening renal failure.  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  Tolerated well with 1.5 L UF, plan for second HD today. He will need arrangement for outpatient HD before discharge.  #Hyperkalemia: Now managed with dialysis.  Monitor BMP.  #Acute hypoxic respiratory failure due to COVID-19 pneumonia: Treatment per primary team.  Getting remdesivir and Decadron.  On supplemental oxygen.  #Pericardial effusion with pretamponade physiology per echo.  May need cardiology evaluation.  Denies chest pain this morning and clinically stable.  #Anemia due to CKD: Hemoglobin at goal.  He gets outpatient ESA injection.  #Secondary hyperparathyroidism: Continue sevelamer.  #Metabolic acidosis: Discontinue sodium bicarbonate since he is now on dialysis.  Subjective: Seen and examined.  Tolerated dialysis well.  Denies nausea vomiting chest pain and shortness of breath is improving.   Objective Vital signs in last 24 hours: Vitals:   05/25/20 1938 05/25/20 2126 05/26/20 0617 05/26/20 0735  BP:  134/90 129/83 (!) 140/93  Pulse:  78 95 82  Resp:  (!) 25 (!) 24 16  Temp:  98 F (36.7 C) 98.3 F (36.8 C) 98.3 F (36.8 C)  TempSrc:  Oral Oral   SpO2: 91% 96% 95% 94%  Weight:      Height:       Weight change:   Intake/Output Summary (Last 24 hours) at 05/26/2020 0918 Last data filed at 05/26/2020 0206 Gross per 24 hour  Intake 240 ml  Output 2000 ml  Net -1760 ml       Labs: Basic Metabolic Panel: Recent Labs  Lab 05/24/20 0652 05/24/20 0652 05/25/20 0716 05/25/20 1027 05/26/20 0220   NA 140   < > 139 138 139  K 6.2*   < > 5.6* 4.9 4.4  CL 108   < > 106 107 105  CO2 17*   < > 16* 17* 20*  GLUCOSE 233*   < > 303* 246* 248*  BUN 97*   < > 107* 96* 93*  CREATININE 8.69*   < > 8.10* 6.95* 6.94*  CALCIUM 8.9   < > 9.0 8.3* 8.7*  PHOS 6.2*  --  5.9*  --  4.7*   < > = values in this interval not displayed.   Liver Function Tests: Recent Labs  Lab 05/24/20 0652 05/25/20 0716 05/26/20 0220  AST 50* 48* 37  ALT 32 35 32  ALKPHOS 55 64 60  BILITOT 0.7 0.7 0.7  PROT 7.0 7.2 7.0  ALBUMIN 2.5* 2.5* 2.5*   No results for input(s): LIPASE, AMYLASE in the last 168 hours. No results for input(s): AMMONIA in the last 168 hours. CBC: Recent Labs  Lab 05/23/20 1636 05/23/20 1948 05/24/20 0652 05/25/20 0716 05/26/20 0220  WBC 7.9   < > 7.1 12.6* 9.6  NEUTROABS 7.0   < > 6.7 11.0* 8.7*  HGB 11.9*   < > 11.3* 12.9* 12.7*  HCT 38.3*   < > 36.5* 41.3 38.5*  MCV 101.3*  --  100.8* 100.5* 97.7  PLT 268   < > 246 307 326   < > = values in this interval not displayed.   Cardiac Enzymes: Recent  Labs  Lab 05/24/20 0652  CKTOTAL 815*   CBG: Recent Labs  Lab 05/25/20 0818 05/25/20 1158 05/25/20 1732 05/25/20 2124 05/26/20 0734  GLUCAP 269* 246* 211* 246* 194*    Iron Studies:  Recent Labs    05/26/20 0220  FERRITIN 4,050*   Studies/Results: No results found.  Medications: Infusions: . remdesivir 100 mg in NS 100 mL 100 mg (05/26/20 0823)    Scheduled Medications: . Chlorhexidine Gluconate Cloth  6 each Topical Q0600  . dexamethasone (DECADRON) injection  6 mg Intravenous Q0600  . heparin  5,000 Units Subcutaneous Q8H  . insulin aspart  0-6 Units Subcutaneous TID WC  . levothyroxine  137 mcg Oral Q0600  . sevelamer carbonate  800 mg Oral TID WC  . sodium bicarbonate  1,300 mg Oral TID    have reviewed scheduled and prn medications.  Physical Exam: General:NAD, comfortable Heart:RRR, s1s2 nl, no rubs Lungs: Bibasal rhonchi, no increased work of  breathing Abdomen:soft, Non-tender, non-distended Extremities: Trace LE edema Dialysis Access: Right upper extremity AV fistula has good thrill and bruit  Clydene Burack Prasad Nickolas Chalfin 05/26/2020,9:18 AM  LOS: 2 days  Pager: [phone]

## 2020-05-26 NOTE — Evaluation (Addendum)
Occupational Therapy Evaluation Patient Details Name: Edward MONNIG Sr. MRN: [id number] DOB: [birth year] Today's Date: 05/26/2020    History of Present Illness Pt is a 49 y/o male admitted secondary to increased confusion. Covid + with PNA. Also with renal failure and will likely be starting HD. PMH includes CKD, DM, HTN, and blindness.    Clinical Impression   PTA, pt lives alone and reports Independence with daily tasks without use of AD. Pt presents now with diagnoses above and deficits in strength, balance, cardiopulmonary tolerance, and cognition. Pt received on 5 L O2 >93% at rest, trialed activity on 3 L O2 with desats varied between 82%-89% with <5 minutes of activities. Pt requires overall Min A for short distance mobility without AD and Min A for LB ADLs at this time. Educated on pursed lip breathing, but pt unable to return demonstrate. However, with seated rest break (< 2 min), pt able to recover to 89-92% on 3 L O2 at rest. Unsure of pt's cognition as answers varied throughout (cognition vs depressive symptoms? - pt reports he misses his children). Anticipate pt to progress well with ADLs and mobility, but recommend 24/7 supervision/assistance at this time. Will monitor and update recommendations as needed.    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 Attempted short distance mobility to/from sink without AD, Min A to  maintain standing balance with one minor LOB noted    Balance Overall balance assessment: Needs assistance Sitting-balance support: No upper extremity supported;Feet supported Sitting balance-Leahy Scale: Fair     Standing balance support: Single extremity supported;During functional activity Standing balance-Leahy Scale: Poor Standing balance comment: reliant on external support from therapist, noted to reach out to sink for steadying                           ADL either performed or assessed with clinical judgement   ADL Overall ADL's : Needs assistance/impaired Eating/Feeding: Set up;Sitting   Grooming: Minimal assistance;Standing Grooming Details (indicate cue type and reason): Min A in standing for streadying. Supervision for washing face sitting EOB Upper Body Bathing: Supervision/ safety;Sitting   Lower Body Bathing: Minimal assistance;Sit to/from stand   Upper Body Dressing : Minimal assistance;Sitting   Lower Body Dressing: Minimal assistance;Sit to/from stand Lower Body Dressing Details (indicate cue type and reason): Pt able to don socks sitting in recliner, but may require Min A for steadying in standing Toilet Transfer: Minimal assistance;Ambulation;BSC Toilet Transfer Details (indicate cue type and reason): Min A to min guard for short mobility in room without AD.  Toileting- Clothing Manipulation and Hygiene: Minimal assistance;Sit to/from stand  Functional mobility during ADLs: Minimal assistance General ADL Comments: Pt with questionable cognition vs depressive symptoms, decreased cardiopulmonary tolerance, generalized weakness, and unsteadiness in  standing. Pt typically does not use AD but may benefit from trial of AD during ADLs initially to decrease fall risk     Vision Baseline Vision/History: Legally blind (L eye blind) Patient Visual Report: Central vision impairment;Peripheral vision impairment;Other (comment) Vision Assessment?: Vision impaired- to be further tested in functional context Additional Comments: Pt with chronic visual deficits, pt reports his vision is "terrible" and he can "hardly see anything". When OT asked for clarification, glaucoma vs cataracts etc, pt would not answer      Perception     Praxis      Pertinent Vitals/Pain Pain Assessment: No/denies pain     Hand Dominance Right   Extremity/Trunk Assessment Upper Extremity Assessment Upper Extremity Assessment: Generalized weakness   Lower Extremity Assessment Lower Extremity Assessment: Defer to PT evaluation   Cervical / Trunk Assessment Cervical / Trunk Assessment: Normal   Communication Communication Communication: No difficulties   Cognition Arousal/Alertness: Awake/alert Behavior During Therapy: Flat affect Overall Cognitive Status: No family/caregiver present to determine baseline cognitive functioning                                 General Comments: Pt slow to respond, noted to answer PT's orientation questions yesterday but would not answer these questions today. Unclear answers at times. Unsure if confused/cognition impaired or if pt depressed, as he reported he misses his kids who go to school in [place]  Pt received on 5 L O2, >93% at rest. While sitting EOB to wash face, removed O2 with slow decline to 85% within 2 minutes. Trialed 3 L O2 with desats to 82% during mobility, but unsure if probe accurate. Pt bounced between low 80s and upper 80s throughout activity. Left on 3 L O2 at rest 89-92% - RN notified. Educated pt on pursed lip breathing with pt not following instructions.     Exercises      Shoulder Instructions      Home Living Family/patient expects to be discharged to:: Private residence Living Arrangements: Alone Available Help at Discharge: Other (Comment);Family (kids live nearby) Type of Home: House Home Access: Stairs to enter [employer] of Steps: 6 Entrance Stairs-Rails: Right;Left;Can reach both Home Layout: Two level;Able to live on main level with bedroom/bathroom     Bathroom Shower/Tub: Teacher, early years/pre: Standard     Home Equipment: None          Prior Functioning/Environment Level of Independence: Independent        Comments: Pt reports independence with ADLs, IADLs and mobility in the home. Previously enjoyed cooking, but reports difficulty due to vision        OT Problem List: Decreased strength;Decreased activity tolerance;Impaired balance (sitting and/or standing);Impaired vision/perception;Decreased coordination;Decreased cognition;Decreased safety awareness;Decreased knowledge of use of DME or AE;Cardiopulmonary status limiting activity      OT Treatment/Interventions: Self-care/ADL training;Therapeutic exercise;Energy conservation;DME and/or AE instruction;Therapeutic activities;Patient/family education    OT Goals(Current goals can be found in the care plan section) Acute Rehab OT Goals Patient Stated Goal: see his kids OT Goal Formulation: With patient Time For Goal Achievement: 06/09/20 Potential to Achieve Goals: Fair ADL Goals Pt Will Perform Grooming: with supervision;standing Pt Will Perform Lower Body Bathing: with supervision;sit to/from stand Pt Will Transfer to Toilet: with  supervision;ambulating;regular height toilet Pt Will Perform Toileting - Clothing Manipulation and hygiene: with modified independence;sit to/from stand;sitting/lateral leans Additional ADL Goal #1: Pt to return demonstrate pursed lip breathing with minimal cues in order to maximize safety, independence and SpO2 stats.   OT Frequency: Min 2X/week   Barriers to D/C:            Co-evaluation              AM-PAC OT "6 Clicks" Daily Activity     Outcome Measure Help from another person eating meals?: A Little Help from another person taking care of personal grooming?: A Little Help from another person toileting, which includes using toliet, bedpan, or urinal?: A Little Help from another person bathing (including washing, rinsing, drying)?: A Little Help from another person to put on and taking off regular upper body clothing?: A Little Help from another person to put on and taking off regular lower body clothing?: A Little 6 Click Score: 18   End of Session Equipment Utilized During Treatment: Oxygen Nurse Communication: Mobility status;Other (comment) (O2, cognition)  Activity Tolerance: Patient tolerated treatment well Patient left: in bed;with call bell/phone within reach  OT Visit Diagnosis: Unsteadiness on feet (R26.81);Other abnormalities of gait and mobility (R26.89);Muscle weakness (generalized) (M62.81)                Time: 3435-6861 OT Time Calculation (min): 43 min Charges:  OT General Charges $OT Visit: 1 Visit OT Evaluation $OT Eval Moderate Complexity: 1 Mod OT Treatments $Self Care/Home Management : 8-22 mins $Therapeutic Activity: 8-22 mins  Layla Maw, OTR/L  Layla Maw 05/26/2020, 11:05 AM

## 2020-05-26 NOTE — Progress Notes (Signed)
Family Medicine Teaching Service Daily Progress Note Intern Pager: [phone]  Patient name: Edward MROCZKA Sr. Medical record number: [id number] Date of birth: Jul 16, 1971 Age: 49 y.o. Gender: male  Primary Care Provider: [name], MD Consultants: None Code Status: Full  Pt Overview and Major Events to Date:  8/21 admitted 8/22 Echo 8/23 First HD  Assessment and Plan: Edward [name] Norrisis a 49 y.o.malewho presentedwith shortness of breath secondary to Covid19pneumonia. 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  Acute hypoxic respiratory failure secondary to Covid pneumonia:Patient does not continue to have SOB and is currently satting> 92%on3L nasal cannula. He feels stable on his [place].WBCs this a.m. 9.6. K 4.4  and Na 139. On exam patient lungs sounded clear. D-dimer 5.54>3.92.Ferritin was released late: [phone]. Patient provided diet as he appears stable in terms of respiratory status at this time.  Patient metal status remains stable, he remains disoriented to place time and situation. -Continuous pulse ox -Continuous cardiac monitoring -A.m. CBC/CMP -Follow-up blood cultures,ngtdx2 -Maintain airborne and contact precautions -Decadron 3/6 -Remdesivir 4/5 -PT/OT eval -CT head  HFrEF-EF 50-55% Patient received echo 8/22. Patient appears euvolemic at this time.Unable to verify home medications but per chart review they seem to include torsemide 100 mg twice daily, as well as Coreg and hydralazine for blood pressure control. -Holding home medication until can verify they are corrector otherwise instructed by cards -Monitor fluid status -Continuous cardiac monitoring/continuous pulse ox  Pericardial effusion: Current BP 129/83.Range BP 109/85-165/107. Echo remains unchanged from previous echo. Patient continues to  have half of breath with EF 50- 55%. There is no evidence of tamponade at this time. Patient continues to have moderately sized circumferential pericardial effusion. -Continuous cardiac monitor -Continuous pulse ox  ESRD Patient presented with CKD5 and has advanced to ESRD. Cr 9.05> 8.69>6.94.K this a.m. 4.4. Mg2.1>2.0 Phos 5.9>4.7. Patient continues to have hypoalbuminemia, 2.5 remains stable.  Patient started HD yesterday 8/23. Scheduled HD is unsure at this time;however, per nephro patient will need to be scheduled for outpatient HD. Cognitive status appears similar to yesterday. -AM CMP to monitor renal function -Consult SW to get patient set up for outpatient HD -Consult nephro, recs appreciated -Continue to monitor cognitive function  Acute metabolic encephalopathy Patient cognitive status appears similar to yesterday disoriented to situation, place, time. Unclear etiology of encephalopathy. HD initiated 8/23. TSH 35 at presentation.  -Continue Synthroid -CT Head   Type 2 diabetes: HgbA1c at admission 6.7. CBG this AM 248. Unable to verify home medications but per chart review they appear to be glipizide 2.5 mg/day. Required 8U SAI yesterday.  - Very sensitive sliding scale insulin - CBGs  Hypothyroidism:  2/2 RAI therapy ofGraves disease.Home medications per chart review Synthroid 137 mcg/day.TSH at admission 35. Patient appears to have slowercognitive ability per family this started over the past 2 weeks in conjunction with COVID.   -Continue home Synthroid  Hypertension: Blood pressurethis a.m. 129/83.Unable to confirm patient's home blood pressure medication, though per chart review he is listed as taking hydralazine twice per day, torsemide 100 mg twice per day, and Coreg.   Anemia Hemoglobin 10.9> 11.1>12.7. Per chart review baseline appears to be between 9 and 11. Most likely anemia of chronic disease. No current signs of bleeding. -A.m. CBC as  above  FEN/GI:Renal diet with carb modifications Prophylaxis:Heparin  Disposition:Med-tele  Subjective:  Patient had no overnight events.  Patient has no complaints this a.m.  Objective: Temp:  [97.7 F (36.5 C)-99.6 F (37.6 C)] 98.3 F (36.8 C) (08/24 0617) Pulse Rate:  [75-95] 95 (08/24 0617) Resp:  [16-30] 24 (08/24 0617) BP: (109-165)/(77-107) 129/83 (08/24 0617) SpO2:  [90 %-98 %] 95 % (08/24 0617) Physical Exam: General: NAD, sitting up in chair, psychomotor retardation noted Cardiovascular: RRR, no murmur detected Respiratory: Clear breath sounds bilaterally, patient breathing comfortably on 3 LNC Abdomen: No pain to palpation, normoactive bowel sounds   Laboratory: Recent Labs  Lab 05/24/20 0652 05/25/20 0716 05/26/20 0220  WBC 7.1 12.6* 9.6  HGB 11.3* 12.9* 12.7*  HCT 36.5* 41.3 38.5*  PLT 246 307 326   Recent Labs  Lab 05/24/20 0652 05/24/20 0652 05/25/20 0716 05/25/20 1027 05/26/20 0220  NA 140   < > 139 138 139  K 6.2*   < > 5.6* 4.9 4.4  CL 108   < > 106 107 105  CO2 17*   < > 16* 17* 20*  BUN 97*   < > 107* 96* 93*  CREATININE 8.69*   < > 8.10* 6.95* 6.94*  CALCIUM 8.9   < > 9.0 8.3* 8.7*  PROT 7.0  --  7.2  --  7.0  BILITOT 0.7  --  0.7  --  0.7  ALKPHOS 55  --  64  --  60  ALT 32  --  35  --  32  AST 50*  --  48*  --  37  GLUCOSE 233*   < > 303* 246* 248*   < > = values in this interval not displayed.      Imaging/Diagnostic Tests: No results found. None  New.  [name], MD 05/26/2020, 6:58 AM PGY-1, [place] Intern pager: [phone], text pages welcome

## 2020-05-26 NOTE — Progress Notes (Signed)
Inpatient Diabetes Program Recommendations  AACE/ADA: New Consensus Statement on Inpatient Glycemic Control (2015)  Target Ranges:  Prepandial:   less than 140 mg/dL      Peak postprandial:   less than 180 mg/dL (1-2 hours)      Critically ill patients:  140 - 180 mg/dL   Lab Results  Component Value Date   GLUCAP 244 (H) 05/26/2020   HGBA1C 6.7 (H) 05/24/2020    Review of Glycemic Control Results for Edward [name], Edward SR. (MRN [id number]) as of 05/26/2020 11:47  Ref. Range 05/25/2020 11:58 05/25/2020 17:32 05/25/2020 21:24 05/26/2020 07:34 05/26/2020 11:45  Glucose-Capillary Latest Ref Range: 70 - 99 mg/dL 246 (H) 211 (H) 246 (H) 194 (H) 244 (H)  Diabetes history:  DM2  Outpatient Diabetes medications:  Glipizide 2.5 mg daily   Current orders for Inpatient glycemic control:  Novolog 0-6 units tid Decadron 6 mg daily Inpatient Diabetes Program Recommendations:    -If steroids continue, please consider adding Novolog 2 units tid with meals (hold if patient eats less than 50%).   Thanks  Adah Perl, RN, BC-ADM Inpatient Diabetes Coordinator Pager [phone] (8a-5p)

## 2020-05-26 NOTE — Progress Notes (Signed)
Renal Navigator has referred patient for OP HD treatment for ESRD based on conversation with Nephrologist/Dr. Carolin Sicks. Patient's address is closest to [facility] clinic, so referral has been sent there to see if they can accommodate COVID positive patient at discharge. Navigator attempted to contact patient to discuss his transportation, but he did not answer his room phone-HIPAA comliant message left on cell number listed for patient in Epic. Navigator notified RN CM/E. [place] of OP HD referral in process and will follow for discharge disposition.  Alphonzo Cruise, [place] Renal Navigator [phone]

## 2020-05-27 ENCOUNTER — Encounter (HOSPITAL_COMMUNITY): Payer: Medicare PPO

## 2020-05-27 LAB — COMPREHENSIVE METABOLIC PANEL
ALT: 32 U/L (ref 0–44)
AST: 39 U/L (ref 15–41)
Albumin: 2.8 g/dL — ABNORMAL LOW (ref 3.5–5.0)
Alkaline Phosphatase: 70 U/L (ref 38–126)
Anion gap: 15 (ref 5–15)
BUN: 102 mg/dL — ABNORMAL HIGH (ref 6–20)
CO2: 20 mmol/L — ABNORMAL LOW (ref 22–32)
Calcium: 9.3 mg/dL (ref 8.9–10.3)
Chloride: 106 mmol/L (ref 98–111)
Creatinine, Ser: 6.39 mg/dL — ABNORMAL HIGH (ref 0.61–1.24)
GFR calc Af Amer: 11 mL/min — ABNORMAL LOW (ref >60)
GFR calc non Af Amer: 9 mL/min — ABNORMAL LOW (ref >60)
Glucose, Bld: 218 mg/dL — ABNORMAL HIGH (ref 70–99)
Potassium: 4.6 mmol/L (ref 3.5–5.1)
Sodium: 141 mmol/L (ref 135–145)
Total Bilirubin: 0.7 mg/dL (ref 0.3–1.2)
Total Protein: 7.5 g/dL (ref 6.5–8.1)

## 2020-05-27 LAB — CBC WITH DIFFERENTIAL/PLATELET
Abs Immature Granulocytes: 0.23 10*3/uL — ABNORMAL HIGH (ref 0.00–0.07)
Basophils Absolute: 0 10*3/uL (ref 0.0–0.1)
Basophils Relative: 1 %
Eosinophils Absolute: 0 10*3/uL (ref 0.0–0.5)
Eosinophils Relative: 0 %
HCT: 40.1 % (ref 39.0–52.0)
Hemoglobin: 12.7 g/dL — ABNORMAL LOW (ref 13.0–17.0)
Immature Granulocytes: 3 %
Lymphocytes Relative: 10 %
Lymphs Abs: 0.8 10*3/uL (ref 0.7–4.0)
MCH: 30.8 pg (ref 26.0–34.0)
MCHC: 31.7 g/dL (ref 30.0–36.0)
MCV: 97.1 fL (ref 80.0–100.0)
Monocytes Absolute: 0.2 10*3/uL (ref 0.1–1.0)
Monocytes Relative: 2 %
Neutro Abs: 7.4 10*3/uL (ref 1.7–7.7)
Neutrophils Relative %: 84 %
Platelets: 334 10*3/uL (ref 150–400)
RBC: 4.13 MIL/uL — ABNORMAL LOW (ref 4.22–5.81)
RDW: 15.5 % (ref 11.5–15.5)
WBC: 8.6 10*3/uL (ref 4.0–10.5)
nRBC: 0.3 % — ABNORMAL HIGH (ref 0.0–0.2)

## 2020-05-27 LAB — GLUCOSE, CAPILLARY
Glucose-Capillary: 194 mg/dL — ABNORMAL HIGH (ref 70–99)
Glucose-Capillary: 208 mg/dL — ABNORMAL HIGH (ref 70–99)
Glucose-Capillary: 186 mg/dL — ABNORMAL HIGH (ref 70–99)

## 2020-05-27 LAB — FERRITIN: Ferritin: 3379 ng/mL — ABNORMAL HIGH (ref 24–336)

## 2020-05-27 LAB — C-REACTIVE PROTEIN: CRP: 2.9 mg/dL — ABNORMAL HIGH (ref <1.0)

## 2020-05-27 LAB — MAGNESIUM: Magnesium: 2.1 mg/dL (ref 1.7–2.4)

## 2020-05-27 LAB — PHOSPHORUS: Phosphorus: 4.6 mg/dL (ref 2.5–4.6)

## 2020-05-27 LAB — D-DIMER, QUANTITATIVE: D-Dimer, Quant: 3.45 ug/mL-FEU — ABNORMAL HIGH (ref 0.00–0.50)

## 2020-05-27 NOTE — Progress Notes (Signed)
   05/27/20 1200  Clinical Encounter Type  Visited With Health care provider  Visit Type Initial  Referral From Nurse  Consult/Referral To Chaplain   Chaplain responded to consult for support. Pt was in HD when Chaplain arrived to pt room. Chaplain will pass off to night chaplain for a visit.   Chaplain Resident, Evelene Croon, M Div [phone] on-call pager

## 2020-05-27 NOTE — Progress Notes (Addendum)
[facility] NEPHROLOGY PROGRESS NOTE  Assessment/ Plan: Pt is a 49 y.o. yo male with history of hypertension, diabetes, CKD 5 follows with me at [facility] is status post right brachiocephalic AV fistula created by Dr. Scot Dock on 04/22/2019, here with Covid pneumonia and worsening renal failure.  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  No problem with AV fistula cannulation.  Tolerated well with 1.5 L UF, plan for second HD today. He will need arrangement for outpatient HD before discharge.  #Hyperkalemia: Now managed with dialysis.  Monitor BMP.  #Acute hypoxic respiratory failure due to COVID-19 pneumonia: Treatment per primary team.  Getting remdesivir and Decadron.  On supplemental oxygen.  #Pericardial effusion without tamponade.  Cardiac monitoring.  Clinically stable, volume management by dialysis.  #Anemia due to CKD: Hemoglobin at goal.  He gets outpatient ESA injection.  #Secondary hyperparathyroidism: Continue sevelamer.  #Metabolic acidosis: Discontinue sodium bicarbonate since he is now on dialysis.  Subjective: Seen and examined.  Requiring higher amount of oxygen.  Unable to do dialysis yesterday because of scheduling.  No nausea vomiting chest pain.  Shortness of breath is not worsen Objective Vital signs in last 24 hours: Vitals:   05/26/20 1559 05/26/20 1601 05/26/20 2317 05/27/20 0728  BP: (!) 155/92  (!) 130/95 (!) 153/101  Pulse: 83  76 77  Resp: (!) 32 18 17 18   Temp: 98.6 F (37 C)  97.7 F (36.5 C) 98.1 F (36.7 C)  TempSrc: Oral  Oral Oral  SpO2: 96%  93% 100%  Weight:      Height:       Weight change:   Intake/Output Summary (Last 24 hours) at 05/27/2020 0926 Last data filed at 05/26/2020 1700 Gross per 24 hour  Intake 100 ml  Output 600 ml  Net -500 ml       Labs: Basic Metabolic Panel: Recent Labs  Lab  05/25/20 0716 05/25/20 0716 05/25/20 1027 05/26/20 0220 05/27/20 0520  NA 139   < > 138 139 141  K 5.6*   < > 4.9 4.4 4.6  CL 106   < > 107 105 106  CO2 16*   < > 17* 20* 20*  GLUCOSE 303*   < > 246* 248* 218*  BUN 107*   < > 96* 93* 102*  CREATININE 8.10*   < > 6.95* 6.94* 6.39*  CALCIUM 9.0   < > 8.3* 8.7* 9.3  PHOS 5.9*  --   --  4.7* 4.6   < > = values in this interval not displayed.   Liver Function Tests: Recent Labs  Lab 05/25/20 0716 05/26/20 0220 05/27/20 0520  AST 48* 37 39  ALT 35 32 32  ALKPHOS 64 60 70  BILITOT 0.7 0.7 0.7  PROT 7.2 7.0 7.5  ALBUMIN 2.5* 2.5* 2.8*   No results for input(s): LIPASE, AMYLASE in the last 168 hours. No results for input(s): AMMONIA in the last 168 hours. CBC: Recent Labs  Lab 05/23/20 1636 05/23/20 1948 05/24/20 4540 05/24/20 0652 05/25/20 0716 05/26/20 0220 05/27/20 0520  WBC 7.9   < > 7.1   < > 12.6* 9.6 8.6  NEUTROABS 7.0   < > 6.7   < > 11.0* 8.7* 7.4  HGB 11.9*   < > 11.3*   < > 12.9* 12.7* 12.7*  HCT 38.3*   < > 36.5*   < > 41.3 38.5* 40.1  MCV 101.3*  --  100.8*  --  100.5* 97.7 97.1  PLT 268   < > 246   < > 307 326 334   < > = values in this interval not displayed.   Cardiac Enzymes: Recent Labs  Lab 05/24/20 0652  CKTOTAL 815*   CBG: Recent Labs  Lab 05/26/20 0734 05/26/20 1145 05/26/20 1555 05/26/20 2057 05/27/20 0726  GLUCAP 194* 244* 309* 256* 194*    Iron Studies:  Recent Labs    05/27/20 0520  FERRITIN 3,379*   Studies/Results: CT HEAD WO CONTRAST  Result Date: 05/26/2020 CLINICAL DATA:  Altered mental status. EXAM: CT HEAD WITHOUT CONTRAST TECHNIQUE: Contiguous axial images were obtained from the base of the skull through the vertex without intravenous contrast. COMPARISON:  02/10/2016 FINDINGS: Brain: There is no evidence of an acute infarct, intracranial hemorrhage, mass, midline shift, or extra-axial fluid collection. The ventricles and sulci are within normal limits. Vascular: No  hyperdense vessel. Skull: No fracture or suspicious osseous lesion. Sinuses/Orbits: Mucosal thickening and fluid in the ethmoid and maxillary sinuses bilaterally. Small volume fluid in the sphenoid sinuses. Clear mastoid air cells. Hemorrhage or other hyperdense material within the vitreous chamber of the left globe in this patient with a history of blindness. Other: None. IMPRESSION: 1. No evidence of acute intracranial abnormality. 2. Sinusitis. Electronically Signed   By: Logan Bores M.D.   On: 05/26/2020 13:49    Medications: Infusions: . remdesivir 100 mg in NS 100 mL 100 mg (05/26/20 0823)    Scheduled Medications: . Chlorhexidine Gluconate Cloth  6 each Topical Q0600  . dexamethasone (DECADRON) injection  6 mg Intravenous Q0600  . heparin  5,000 Units Subcutaneous Q8H  . insulin aspart  0-6 Units Subcutaneous TID WC  . levothyroxine  137 mcg Oral Q0600  . sevelamer carbonate  800 mg Oral TID WC    have reviewed scheduled and prn medications.  Physical Exam: General:NAD, comfortable, able to lie flat Heart:RRR, s1s2 nl, no rubs Lungs: Bibasal rhonchi, no increased work of breathing Abdomen:soft, Non-tender, non-distended Extremities: Trace LE edema Dialysis Access: Right upper extremity AV fistula has good thrill and bruit  Heavin Sebree Prasad Darran Gabay 05/27/2020,9:26 AM  LOS: 3 days  Pager: [phone]

## 2020-05-27 NOTE — Progress Notes (Signed)
Physical Therapy Treatment Patient Details Name: Edward ROWE Sr. MRN: [id number] DOB: June 27, 1971 Today's Date: 05/27/2020    History of Present Illness Pt is a 49 y/o male admitted secondary to increased confusion. Covid + with PNA. Also with renal failure and will likely be starting HD. PMH includes CKD, DM, HTN, and blindness.     PT Comments    Patient received sitting at EOB, willing to work with PT but not consistently answering questions; SpO2 at 86% on RA and he did not follow cues/instructions for PLB so replaced O2 at 3LPM and sats recovered and maintained at >90%. Tolerated gait training in room 24fx2 with min guard and good compensations for blindness in L eye. Left sitting at EOB with RN present preparing for HD, all other needs met this morning.     Follow Up Recommendations  Home health PT     Equipment Recommendations  None recommended by PT    Recommendations for Other Services       Precautions / Restrictions Precautions Precautions: Fall;Other (comment) Precaution Comments: watch O2 sats; pt blind in L eye, low vision Restrictions Weight Bearing Restrictions: No    Mobility  Bed Mobility               General bed mobility comments: sitting at EOB  Transfers Overall transfer level: Needs assistance Equipment used: None Transfers: Sit to/from Stand Sit to Stand: Min guard         General transfer comment: min guard for safety and steadying, no physical assist given  Ambulation/Gait Ambulation/Gait assistance: Min guard Gait Distance (Feet): 20 Feet (x2) Assistive device: None Gait Pattern/deviations: Step-through pattern;WFL(Within Functional Limits) Gait velocity: decreased   General Gait Details: steady wtih gait in room including turns   SMarine scientistRankin (Stroke Patients Only)       Balance Overall balance assessment: Needs assistance Sitting-balance support: No upper  extremity supported;Feet supported Sitting balance-Leahy Scale: Good     Standing balance support: During functional activity Standing balance-Leahy Scale: Fair Standing balance comment: slow pace but very steady                            Cognition Arousal/Alertness: Awake/alert Behavior During Therapy: Flat affect Overall Cognitive Status: No family/caregiver present to determine baseline cognitive functioning                                 General Comments: slow to respond and sometimes answering some questions but not others, unclear answers. At one point stated he knew his families #s to call on the room phone, then at [place] he didnt' nkow them      Exercises      General Comments General comments (skin integrity, edema, etc.): on RA at 85% upon PT entry; did not follow PT instructions for PLB so replaced [place] at 3LPM and O2 increased to >90% at rest and with activity      Pertinent Vitals/Pain Pain Assessment: No/denies pain    Home Living                      Prior Function            PT Goals (current goals can now be found in the care plan section) Acute  Rehab PT Goals Patient Stated Goal: see his kids PT Goal Formulation: With patient Time For Goal Achievement: 06/08/20 Potential to Achieve Goals: Good Progress towards PT goals: Progressing toward goals    Frequency    Min 3X/week      PT Plan Discharge plan needs to be updated    Co-evaluation              AM-PAC PT "6 Clicks" Mobility   Outcome Measure  Help needed turning from your back to your side while in a flat bed without using bedrails?: None Help needed moving from lying on your back to sitting on the side of a flat bed without using bedrails?: A Little Help needed moving to and from a bed to a chair (including a wheelchair)?: A Little Help needed standing up from a chair using your arms (e.g., wheelchair or bedside chair)?: A Little Help needed  to walk in hospital room?: A Little Help needed climbing 3-5 steps with a railing? : A Little 6 Click Score: 19    End of Session Equipment Utilized During Treatment: Oxygen Activity Tolerance: Patient tolerated treatment well Patient left: in bed;with call bell/phone within reach;with nursing/sitter in room Nurse Communication: Mobility status PT Visit Diagnosis: Unsteadiness on feet (R26.81);Muscle weakness (generalized) (M62.81);Difficulty in walking, not elsewhere classified (R26.2)     Time: 1007-1030 PT Time Calculation (min) (ACUTE ONLY): 23 min  Charges:  $Gait Training: 8-22 mins $Therapeutic Activity: 8-22 mins                     Windell Norfolk, DPT, PN1   Supplemental Physical Therapist [place]    Pager [phone] Acute Rehab Office [phone]

## 2020-05-27 NOTE — Progress Notes (Addendum)
Patient has been accepted at [facility] for treatment of ESRD. He will need to treat on a 3rd shift in isolation while he is COVID positive. He should arrive to the clinic at 5:00pm and call from the parking lot: [phone]. He will complete intake paperwork on Friday prior to his first treatment in the clinic. Patient will receive his regular chair time from staff at a later time. Renal Navigator has attempted to contact patient numerous times without answer. He has not returned cell phone messages left. Therefore, Renal Navigator has not been able to ensure transportation to OP HD clinic or verbally inform patient of his clinic information/seat schedule. Renal Navigator gave patient written information via HD RN and asked RN to ask patient to call Navigator.  Alphonzo Cruise, [place] Renal Navigator [phone]

## 2020-05-27 NOTE — TOC Progression Note (Signed)
Transition of Care (TOC) - Progression Note    Patient Details  Name: ROXANNE ORNER Sr. MRN: [id number] Date of Birth: [birth year]  [facility] CM/SW Contact  Pollie Friar, RN Phone Number: 05/27/2020, 3:28 PM  Clinical Narrative:    CM noted in MD notes about Covid Care at home. CM spoke with MD and they would like this service. CM has reached out to [facility]) and faxed them the needed information. They will update if able to accept the pt.  Per PT note is looks like pt will qualify for home oxygen. CM asked MD for DME oxygen orders. Will also need sat qualification note.  Per PT/OT recommendations are for [facility] services. CM attempted to speak to pt and wife without success.  TOC following.   Expected Discharge Plan: [place] Barriers to Discharge: Continued Medical Work up  Expected Discharge Plan and Services Expected Discharge Plan: [place] In-house Referral: Clinical Social Work Discharge Planning Services: CM Consult                                           Social Determinants of Health (SDOH) Interventions    Readmission Risk Interventions No flowsheet data found.

## 2020-05-27 NOTE — Progress Notes (Signed)
Family Medicine Teaching Service Daily Progress Note Intern Pager: [phone]  Patient name: Edward PORTLOCK Sr. Medical record number: [id number] Date of birth: [birth year] Age: 49 y.o. Gender: male  Primary Care Provider: [name], MD Consultants: Nephro Code Status: FULL  Pt Overview and Major Events to Date:  8/21 admitted 8/22 Echo 8/23 First HD  Assessment and Plan: Edward [name] Norrisis a 49 y.o.malewho presentedwith shortness of breath secondary to Covid19pneumonia. 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  Acute hypoxic respiratory failure secondary to Covid pneumonia:Patientdoes notcontinue to have SOB and is currently satting> 92%on3L nasal cannula. He feels stable on his [place].WBCs this a.m.8.6.K 4.6and Na 141.Hgb 12.6.On exam patient lungs sounded clear.D-dimer 5.54>3.92>3.45.Ferritin: 3961>4773>4050>3379. Patient provided diet as he appears stable in terms of respiratory status at this time.  Patient mental status has improved. He is AOx4. CT Head was neg for intracranial abnormality. Lungs clear on exam today. -Continuous pulse ox - Wean off O2 and monitor -Continuous cardiac monitoring -A.m. CBC/CMP -Follow-up blood cultures,ngtdx4 -Maintain airborne and contact precautions -Decadron4/6 -Remdesivir5/5 -PT/OT eval   HFrEF-EF 50-55% Patient received echo 8/22. Patient appears euvolemic at this time.Unable to verify home medications but per chart review they seem to include torsemide 100 mg twice daily, as well as Coreg and hydralazine for blood pressure control. -Holding home medication until can verify they are corrector otherwise instructed by cards -Monitor fluid status -Continuous cardiac monitoring/continuous pulse ox  Pericardial effusion: Current BP165/97.Range BP153/101-171/100. Echo  remains unchanged from previous echo. There is no evidence of tamponade at this time. Patient continues to have moderately sized circumferential pericardial effusion. -Continuous cardiac monitor -Continuous pulse ox  ESRD Patient presented with CKD5 and has advanced to ESRD. Cr 9.05> 8.69>6.94>6.39.K this a.m.4.6.Mg2.1>2.0>2.1Phos 5.9>4.7>4.6.Patient continues to have hypoalbuminemia, 2.8 remains stable. Patient has HD scheduled for today. Patient having a difficult time adjusting to the idea of HD. Although he reports that the treatments do not physically cause him pain he is having a hard time adjusting to the new medication regimen. Patient agreed to go to HD after MD called into his room from HD. SW has started CLIP process - Chaplain consult -AM CMP to monitor renal function -Consult SW to get patient set up for outpatient HD -Consult nephro, recs appreciated   Acute metabolic encephalopathy Patient cognitive status improved. AOx4.Marland Kitchen Unclear etiology of encephalopathy. HD initiated 8/23. Patient appears to be having a hard time adjusting to HD mentally. -Continue Synthroid -CT Head   Type 2 diabetes: HgbA1c at admission 6.7.CBG this AM 194.Unable to verify home medications but per chart review they appear to be glipizide 2.5 mg/day. Required 7U SAI yesterday.  - Very sensitive sliding scale insulin - CBGs  Hypothyroidism:  2/2 RAI therapy ofGraves disease.Home medications per chart review Synthroid 137 mcg/day.TSH at admission 35. Patient appeared to have slowercognitive ability per family this started over the past 2 weeks in conjunction with COVID and at time of admission.  -Continue home Synthroid  Hypertension: Blood pressurethis a.m. 165/97.Unable to confirm patient's home blood pressure medication, though per chart review he is listed as taking hydralazine twice per day, torsemide 100 mg twice per day, and Coreg. - Continue to monitor - Consider  adding a medication the PM   Anemia Hemoglobin 10.9>11.1>12.7>12.6. Per chart review baseline appears to be between 9 and 11. Most likely anemia of chronic disease. No current signs of bleeding. -A.m. CBC as above  FEN/GI:Renal diet with carb modifications Prophylaxis:Heparin  Disposition:Med-tele   Subjective:  Patient had no overnight events. Patient refusing to go to HD this AM. He reports that he does not have pain with HD but does not want to go because of "all the pills." Patient is refusing to take his new pills recommended by nephro for ESRD. Patient is upset about all the changes. He does not endorse SI.   Objective: Temp:  [97.7 F (36.5 C)-98.6 F (37 C)] 98.1 F (36.7 C) (08/25 0728) Pulse Rate:  [76-83] 77 (08/25 0728) Resp:  [17-32] 18 (08/25 0728) BP: (130-155)/(92-101) 153/101 (08/25 0728) SpO2:  [93 %-100 %] 100 % (08/25 0728) Physical Exam: General: NAD, Patient sitting up on side of bed refusing to take medications and go to HD. Patient is looking at the ground while talking and volume is not very loud.  Patient looks depressed. AOx4. Psychomotor depression noted.  Cardiovascular: RRR, no murmur Respiratory: Clear bilaterally, good chest expansion, breathing comfortably on 3L [place] Abdomen: No pain to palpation, normoactive bowel sounds Extremities: Non pitting edema BLE, Distal pulses intact  Laboratory: Recent Labs  Lab 05/25/20 0716 05/26/20 0220 05/27/20 0520  WBC 12.6* 9.6 8.6  HGB 12.9* 12.7* 12.7*  HCT 41.3 38.5* 40.1  PLT 307 326 334   Recent Labs  Lab 05/25/20 0716 05/25/20 0716 05/25/20 1027 05/26/20 0220 05/27/20 0520  NA 139   < > 138 139 141  K 5.6*   < > 4.9 4.4 4.6  CL 106   < > 107 105 106  CO2 16*   < > 17* 20* 20*  BUN 107*   < > 96* 93* 102*  CREATININE 8.10*   < > 6.95* 6.94* 6.39*  CALCIUM 9.0   < > 8.3* 8.7* 9.3  PROT 7.2  --   --  7.0 7.5  BILITOT 0.7  --   --  0.7 0.7  ALKPHOS 64  --   --  60 70  ALT 35   --   --  32 32  AST 48*  --   --  37 39  GLUCOSE 303*   < > 246* 248* 218*   < > = values in this interval not displayed.      Imaging/Diagnostic Tests: CT HEAD WO CONTRAST  Result Date: 05/26/2020 CLINICAL DATA:  Altered mental status. EXAM: CT HEAD WITHOUT CONTRAST TECHNIQUE: Contiguous axial images were obtained from the base of the skull through the vertex without intravenous contrast. COMPARISON:  02/10/2016 FINDINGS: Brain: There is no evidence of an acute infarct, intracranial hemorrhage, mass, midline shift, or extra-axial fluid collection. The ventricles and sulci are within normal limits. Vascular: No hyperdense vessel. Skull: No fracture or suspicious osseous lesion. Sinuses/Orbits: Mucosal thickening and fluid in the ethmoid and maxillary sinuses bilaterally. Small volume fluid in the sphenoid sinuses. Clear mastoid air cells. Hemorrhage or other hyperdense material within the vitreous chamber of the left globe in this patient with a history of blindness. Other: None. IMPRESSION: 1. No evidence of acute intracranial abnormality. 2. Sinusitis. Electronically Signed   By: Logan Bores M.D.   On: 05/26/2020 13:49    [name], MD 05/27/2020, 8:30 AM PGY-1, [place] Intern pager: [phone], text pages welcome

## 2020-05-27 NOTE — Progress Notes (Signed)
Enroll in CoviD at  Home program

## 2020-05-28 ENCOUNTER — Encounter (HOSPITAL_COMMUNITY): Payer: Medicare PPO

## 2020-05-28 LAB — CBC WITH DIFFERENTIAL/PLATELET
Abs Immature Granulocytes: 0.22 10*3/uL — ABNORMAL HIGH (ref 0.00–0.07)
Basophils Absolute: 0.1 10*3/uL (ref 0.0–0.1)
Basophils Relative: 1 %
Eosinophils Absolute: 0 10*3/uL (ref 0.0–0.5)
Eosinophils Relative: 0 %
HCT: 40.4 % (ref 39.0–52.0)
Hemoglobin: 13.1 g/dL (ref 13.0–17.0)
Immature Granulocytes: 3 %
Lymphocytes Relative: 13 %
Lymphs Abs: 1 10*3/uL (ref 0.7–4.0)
MCH: 31.6 pg (ref 26.0–34.0)
MCHC: 32.4 g/dL (ref 30.0–36.0)
MCV: 97.6 fL (ref 80.0–100.0)
Monocytes Absolute: 0.2 10*3/uL (ref 0.1–1.0)
Monocytes Relative: 3 %
Neutro Abs: 6.5 10*3/uL (ref 1.7–7.7)
Neutrophils Relative %: 80 %
Platelets: 287 10*3/uL (ref 150–400)
RBC: 4.14 MIL/uL — ABNORMAL LOW (ref 4.22–5.81)
RDW: 15.4 % (ref 11.5–15.5)
WBC: 8 10*3/uL (ref 4.0–10.5)
nRBC: 0 % (ref 0.0–0.2)

## 2020-05-28 LAB — CULTURE, BLOOD (ROUTINE X 2)
Culture: NO GROWTH
Culture: NO GROWTH
Special Requests: ADEQUATE
Special Requests: ADEQUATE

## 2020-05-28 LAB — COMPREHENSIVE METABOLIC PANEL
ALT: 40 U/L (ref 0–44)
AST: 57 U/L — ABNORMAL HIGH (ref 15–41)
Albumin: 2.7 g/dL — ABNORMAL LOW (ref 3.5–5.0)
Alkaline Phosphatase: 66 U/L (ref 38–126)
Anion gap: 15 (ref 5–15)
BUN: 75 mg/dL — ABNORMAL HIGH (ref 6–20)
CO2: 21 mmol/L — ABNORMAL LOW (ref 22–32)
Calcium: 8.7 mg/dL — ABNORMAL LOW (ref 8.9–10.3)
Chloride: 102 mmol/L (ref 98–111)
Creatinine, Ser: 5.3 mg/dL — ABNORMAL HIGH (ref 0.61–1.24)
GFR calc Af Amer: 14 mL/min — ABNORMAL LOW (ref >60)
GFR calc non Af Amer: 12 mL/min — ABNORMAL LOW (ref >60)
Glucose, Bld: 172 mg/dL — ABNORMAL HIGH (ref 70–99)
Potassium: 4.6 mmol/L (ref 3.5–5.1)
Sodium: 138 mmol/L (ref 135–145)
Total Bilirubin: 0.8 mg/dL (ref 0.3–1.2)
Total Protein: 7.2 g/dL (ref 6.5–8.1)

## 2020-05-28 LAB — GLUCOSE, CAPILLARY
Glucose-Capillary: 153 mg/dL — ABNORMAL HIGH (ref 70–99)
Glucose-Capillary: 210 mg/dL — ABNORMAL HIGH (ref 70–99)
Glucose-Capillary: 247 mg/dL — ABNORMAL HIGH (ref 70–99)
Glucose-Capillary: 213 mg/dL — ABNORMAL HIGH (ref 70–99)

## 2020-05-28 LAB — PHOSPHORUS: Phosphorus: 5 mg/dL — ABNORMAL HIGH (ref 2.5–4.6)

## 2020-05-28 LAB — MAGNESIUM: Magnesium: 2 mg/dL (ref 1.7–2.4)

## 2020-05-28 MED ORDER — DEXAMETHASONE 4 MG PO TABS
6.0000 mg | ORAL_TABLET | Freq: Every day | ORAL | Status: DC
  Administered 2020-05-29: 6 mg via ORAL
  Filled 2020-05-28: qty 2

## 2020-05-28 MED ORDER — CHLORHEXIDINE GLUCONATE CLOTH 2 % EX PADS
6.0000 | MEDICATED_PAD | Freq: Every day | CUTANEOUS | Status: DC
  Administered 2020-05-28 – 2020-05-29 (×2): 6 via TOPICAL

## 2020-05-28 NOTE — Progress Notes (Signed)
SATURATION QUALIFICATIONS: (This note is used to comply with regulatory documentation for home oxygen)  Patient Saturations on Room Air at Rest = 87%  Patient Saturations on Room Air while Ambulating = 85%  Patient Saturations on 3 Liters of oxygen while Ambulating = 91%  Please briefly explain why patient needs home oxygen: O2 sats drop without oxygen.

## 2020-05-28 NOTE — TOC Progression Note (Signed)
Transition of Care (TOC) - Progression Note    Patient Details  Name: Edward DARCO Sr. MRN: [id number] Date of Birth: 1971-02-24  Transition of Care [facility]) CM/SW Contact  Pollie Friar, RN Phone Number: 05/28/2020, 3:52 PM  Clinical Narrative:    CM has not been able to speak to the patient as he doesn't answer either phone. CM called and spoke to his sister: Ruby. She feels the patient is down about being in the hospital and new HD. She feels he will do better once he d/cs home. She and her spouse are willing to do shopping and other errands but will not be able to enter the home as she is high risk and can not get covid exposure.  CM and renal CSW are working on getting transportation arrange for HD. Pt was driving prior to admission but will be limited once d/ced.  [place] services arranged through [facility]. [facility] with [facility] will follow for d/c.  Pt may need home oxygen once d/ced. Pt will need ambulatory sats and DME oxygen orders.  TOC following.   Expected Discharge Plan: [place] Barriers to Discharge: Continued Medical Work up  Expected Discharge Plan and Services Expected Discharge Plan: [place] In-house Referral: Clinical Social Work Discharge Planning Services: CM Consult Post Acute Care Choice: Home Health                             HH Arranged: RN, PT, OT [facility] Agency: [facility] Date [place]: 05/28/20   Representative spoke with at [place]: [place] Interventions    Readmission Risk Interventions No flowsheet data found.

## 2020-05-28 NOTE — Progress Notes (Signed)
Renal Navigator attempted to call patient on room phone and cell phone again without success. Navigator spoke with bedside RN, who took phone in with her and spoke with patient while Navigator spoke with her. Patient reports that he is too weak to drive himself at this time, though he typically drives. He reports that there is no one else who can transport him to/from OP HD.  Navigator contacted [facility] to see about transportation while patient is COVID positive or until patient is strong enough to drive himself again. Navigator was told by [facility] staff that he will need to have authorization by his insurance company and that [place] will need this authorization number before they will be able to transport. Navigator contacted CM/K. Percell Miller to discuss. Navigator also spoke with TOC Supervisor/J. Scinto about transportation barrier. At this time, patient is not cleared to discharge because transportation to/from OP HD has not been confirmed. It also sounds like patient does not have anyone to assist him at home. Navigator updated Nephrologist and will continue to follow closely.   Edward [name], [place] Renal Navigator  [phone]

## 2020-05-28 NOTE — Progress Notes (Addendum)
Family Medicine Teaching Service Daily Progress Note Intern Pager: [phone]  Patient name: Edward WHACK Sr. Medical record number: [id number] Date of birth: [birth year] Age: 49 y.o. Gender: male  Primary Care Provider: [name], MD Consultants: Nephro Code Status: FULL  Pt Overview and Major Events to Date:  8/21 admitted 8/22 Echo 8/23 First HD  Assessment and Plan: Assessment and Plan: EOIN Edward [name] a 49 y.o.malewho presentedwith shortness of breath secondary to Covid19pneumonia. 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  Acute hypoxic respiratory failure secondary to Covid pneumonia: Patient was breathing 3L satting approx 94% when I entered room. Attempted to wean patient however he desatted to 84% and required 6L before he was satting above 90%.  Patient was later ambulated and desatted to 85% while walking on RA and 91% on 3L. Patient is currently back to 3L [place] at rest. There is record of patient requiring HFNC briefly 8/25 and desatted to 87% while on it, but otherwise patient was >95% on 2-3L [place]. He feels stable on his [place].WBCs this a.m.8.0.K4.6and Na 138.Hgb 13.1.On exam patientlungs sounded clear.Patient mental status has improved. He is AOx4.  Patient is able to get the year but not the month.  Lungs clear on exam today. Patient needs standard 10 day course of Decadron.  PT saw patient and recommended HH PT. -Continuous pulse ox -Attemept  Wean off O2 and monitor -Continuous cardiac monitoring -A.m. CBC/CMP -Follow-up blood cultures,ngtdx5 -Maintain airborne and contact precautions -Decadron5/10 -Remdesivircompleted 5 courses 8/25 -OT eval   HFrEF-EF 50-55% Patient appears euvolemic at this time.Unable to verify home medications but per chart review they seem to include torsemide 100 mg twice daily,  as well as Coreg and hydralazine for blood pressure control. -Holding home medication until can verify they are corrector otherwise instructed by cards -Monitor fluid status -Continuous cardiac monitoring/continuous pulse ox  Pericardial effusion: Current BP144/93. Echo remains unchanged from previous echo. There is no evidence of tamponade at this time. Patient continues to have moderately sized circumferential pericardial effusion. -Continuous cardiac monitor -Continuous pulse ox  ESRD Patient presented with CKD5 and has advanced to ESRD.Cr 9.05> 8.69>6.94>6.39>5.3.K this a.m.4.6.Mg2.1>2.0>2.1>2.0Phos 5.9>4.7>4.6>5.0.Patient continues to have hypoalbuminemia, 2.7remains stable.Patient having a difficult time adjusting to the idea of HD. Chaplain was unable to see patient as he was in HD. They will attempt today. Assessed patient for possibility of starting SSRI. He reported that he did not feel this was necessary he believes he needs time to adjust to the change of being an HD patient and accept this in his life. Patient thinks he will eventually be able to accept this on his own. Patient negative for SI and HI. Patient has reportedly been refusing meals and not eating although after speaking with him this AM patient did eat some of his lunch. SW found outpatient HD site, but are looking for a ride to HD for patient. Attempted to briefly educate patient on HD this AM. - Chaplain consult -AM CMP to monitor renal function -Consult SW to get patient set up for outpatient HD -Consult nephro, recs appreciated   Acute metabolic encephalopathy Patient cognitive status improved. AOx4.Marland Kitchen Unclear etiology of encephalopathy. HD initiated 8/23.Patient appears to be having a hard time adjusting to HD mentally. -Continue Synthroid -CT Head   Type 2 diabetes: HgbA1c at admission 6.7.CBG this AM 172.Unable to verify home medications but per chart review they appear to be  glipizide  2.5 mg/day.Required 2U SAI yesterday. - Very sensitive sliding scale insulin - CBGs  Hypothyroidism:  2/2 RAI therapy ofGraves disease.Home medications per chart review Synthroid 137 mcg/day.Cognitive ability appears to be improving. -Continue home Synthroid  Hypertension: Blood pressurethis a.m. 144/93.Range BP142/123-171/100.Unable to confirm patient's home blood pressure medication, though per chart review he is listed as taking hydralazine twice per day, torsemide 100 mg twice per day, and Coreg. Patient is newly diagnosed ESRD, we will not lower BP at this time. - Continue to monitor - Consider adding a medication the PM   Anemia Hemoglobin 10.9>11.1>12.7>12.6>13.1. Per chart review baseline appears to be between 9 and 11. Most likely anemia of chronic disease. No current signs of bleeding. -A.m. CBC as above  FEN/GI:Renal diet with carb modifications Prophylaxis:Heparin  Disposition:Med-tele  Subjective:  No overnight events.  Objective: Temp:  [97.9 F (36.6 C)-98.1 F (36.7 C)] 97.9 F (36.6 C) (08/25 2339) Pulse Rate:  [77-86] 77 (08/25 2339) Resp:  [18-22] 18 (08/25 2339) BP: (142-171)/(82-123) 144/93 (08/25 2339) SpO2:  [87 %-100 %] 95 % (08/25 2339) Weight:  [96.2 kg-98.1 kg] 96.2 kg (08/25 1355) Physical Exam: General: NAD.  Patient continues to look depressed with psychomotor retardation.  Patient looking down at the ground and over at the wall and at the ceiling has to be instructed to look at me when speaking.  Patient vocal volume is low near mumble.  Patient has not gotten out of bed to eat his breakfast which is sitting next to him in the chair.  Patient is AO x4 can get the year but does not know the month. Cardiovascular: RRR, no murmur detected Respiratory: Clear and equal bilaterally, patient was breathing comfortably at 3 L [place] with O2 sat approximately 94% try to wean to room air patient desatted to 84% required 6 L  nasal cannula before he was able to sat greater than 90% consistently. Abdomen: Normoactive bowel sounds, no pain to palpation Extremities: Distal pulses intact, euvolemic appearance  Laboratory: Recent Labs  Lab 05/26/20 0220 05/27/20 0520 05/28/20 0436  WBC 9.6 8.6 8.0  HGB 12.7* 12.7* 13.1  HCT 38.5* 40.1 40.4  PLT 326 334 287   Recent Labs  Lab 05/26/20 0220 05/27/20 0520 05/28/20 0436  NA 139 141 138  K 4.4 4.6 4.6  CL 105 106 102  CO2 20* 20* 21*  BUN 93* 102* 75*  CREATININE 6.94* 6.39* 5.30*  CALCIUM 8.7* 9.3 8.7*  PROT 7.0 7.5 7.2  BILITOT 0.7 0.7 0.8  ALKPHOS 60 70 66  ALT 32 32 40  AST 37 39 57*  GLUCOSE 248* 218* 172*      Imaging/Diagnostic Tests: None new  [name], MD 05/28/2020, 7:32 AM PGY-1, [place] Intern pager: [phone], text pages welcome

## 2020-05-28 NOTE — Progress Notes (Addendum)
[facility] NEPHROLOGY PROGRESS NOTE  Assessment/ Plan: Pt is a 49 y.o. yo male with history of hypertension, diabetes, CKD 5 follows with me at [facility] is status post right brachiocephalic AV fistula created by Dr. Scot Dock on 04/22/2019, here with Covid pneumonia and worsening renal failure.  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  No problem with AV fistula cannulation.  Tolerated 2 HD treatments.  He has outpatient HD arranged at [place] unit Monday Wednesday Friday schedule.  He will likely go home today and get HD tomorrow. Incase if he remains in the hospital, I will order HD for tomorrow.  Discussed with renal navigator.  #Hyperkalemia: Now managed with dialysis.  Monitor BMP.  #Acute hypoxic respiratory failure due to COVID-19 pneumonia: Treatment per primary team.  Getting remdesivir and Decadron.  On supplemental oxygen.  #Pericardial effusion without tamponade.  Cardiac monitoring.  Clinically stable, volume management by dialysis.  #Anemia due to CKD: Hemoglobin at goal.  He gets outpatient ESA injection, no need now.  #Secondary hyperparathyroidism: Continue sevelamer.  #Metabolic acidosis: Discontinued sodium bicarbonate since he is now on dialysis.  Subjective: Seen and examined.  He had dialysis yesterday with UF 1.5 L.  Tolerated well.  He denies nausea vomiting chest pain shortness of breath.  Objective Vital signs in last 24 hours: Vitals:   05/27/20 1611 05/27/20 1613 05/27/20 2339 05/28/20 0736  BP:  (!) 142/123 (!) 144/93 (!) 153/106  Pulse:  82 77 79  Resp: 19 18 18 16   Temp: 98 F (36.7 C)  97.9 F (36.6 C) 97.9 F (36.6 C)  TempSrc: Oral  Oral   SpO2:  (!) 87% 95% 95%  Weight:      Height:       Weight change:   Intake/Output Summary (Last 24 hours) at 05/28/2020 0856 Last data filed at 05/27/2020 1355 Gross  per 24 hour  Intake --  Output 2050 ml  Net -2050 ml       Labs: Basic Metabolic Panel: Recent Labs  Lab 05/26/20 0220 05/27/20 0520 05/28/20 0436  NA 139 141 138  K 4.4 4.6 4.6  CL 105 106 102  CO2 20* 20* 21*  GLUCOSE 248* 218* 172*  BUN 93* 102* 75*  CREATININE 6.94* 6.39* 5.30*  CALCIUM 8.7* 9.3 8.7*  PHOS 4.7* 4.6 5.0*   Liver Function Tests: Recent Labs  Lab 05/26/20 0220 05/27/20 0520 05/28/20 0436  AST 37 39 57*  ALT 32 32 40  ALKPHOS 60 70 66  BILITOT 0.7 0.7 0.8  PROT 7.0 7.5 7.2  ALBUMIN 2.5* 2.8* 2.7*   No results for input(s): LIPASE, AMYLASE in the last 168 hours. No results for input(s): AMMONIA in the last 168 hours. CBC: Recent Labs  Lab 05/24/20 0652 05/24/20 0652 05/25/20 0716 05/25/20 0716 05/26/20 0220 05/27/20 0520 05/28/20 0436  WBC 7.1   < > 12.6*   < > 9.6 8.6 8.0  NEUTROABS 6.7   < > 11.0*   < > 8.7* 7.4 6.5  HGB 11.3*   < > 12.9*   < > 12.7* 12.7* 13.1  HCT 36.5*   < > 41.3   < > 38.5* 40.1 40.4  MCV 100.8*  --  100.5*  --  97.7 97.1 97.6  PLT 246   < > 307   < > 326 334 287   < > = values in this  interval not displayed.   Cardiac Enzymes: Recent Labs  Lab 05/24/20 0652  CKTOTAL 815*   CBG: Recent Labs  Lab 05/26/20 2057 05/27/20 0726 05/27/20 1610 05/27/20 2101 05/28/20 0733  GLUCAP 256* 194* 208* 186* 153*    Iron Studies:  Recent Labs    05/27/20 0520  FERRITIN 3,379*   Studies/Results: CT HEAD WO CONTRAST  Result Date: 05/26/2020 CLINICAL DATA:  Altered mental status. EXAM: CT HEAD WITHOUT CONTRAST TECHNIQUE: Contiguous axial images were obtained from the base of the skull through the vertex without intravenous contrast. COMPARISON:  02/10/2016 FINDINGS: Brain: There is no evidence of an acute infarct, intracranial hemorrhage, mass, midline shift, or extra-axial fluid collection. The ventricles and sulci are within normal limits. Vascular: No hyperdense vessel. Skull: No fracture or suspicious osseous  lesion. Sinuses/Orbits: Mucosal thickening and fluid in the ethmoid and maxillary sinuses bilaterally. Small volume fluid in the sphenoid sinuses. Clear mastoid air cells. Hemorrhage or other hyperdense material within the vitreous chamber of the left globe in this patient with a history of blindness. Other: None. IMPRESSION: 1. No evidence of acute intracranial abnormality. 2. Sinusitis. Electronically Signed   By: Logan Bores M.D.   On: 05/26/2020 13:49    Medications: Infusions:   Scheduled Medications: . Chlorhexidine Gluconate Cloth  6 each Topical Q0600  . dexamethasone (DECADRON) injection  6 mg Intravenous Q0600  . heparin  5,000 Units Subcutaneous Q8H  . insulin aspart  0-6 Units Subcutaneous TID WC  . levothyroxine  137 mcg Oral Q0600  . sevelamer carbonate  800 mg Oral TID WC    have reviewed scheduled and prn medications.  Physical Exam: General:NAD, comfortable, able to lie flat Heart:RRR, s1s2 nl, no rubs Lungs: Clear bilateral, no wheezing or crackle appreciated Abdomen:soft, Non-tender, non-distended Extremities: No LE edema Dialysis Access: Right upper extremity AV fistula has good thrill and bruit  Gilma Bessette Prasad Leshawn Straka 05/28/2020,8:56 AM  LOS: 4 days  Pager: [phone]

## 2020-05-29 LAB — GLUCOSE, CAPILLARY
Glucose-Capillary: 209 mg/dL — ABNORMAL HIGH (ref 70–99)
Glucose-Capillary: 153 mg/dL — ABNORMAL HIGH (ref 70–99)
Glucose-Capillary: 199 mg/dL — ABNORMAL HIGH (ref 70–99)

## 2020-05-29 MED ORDER — SEVELAMER CARBONATE 800 MG PO TABS
800.0000 mg | ORAL_TABLET | Freq: Three times a day (TID) | ORAL | 0 refills | Status: DC
Start: 2020-05-29 — End: 2023-12-05

## 2020-05-29 MED ORDER — DEXAMETHASONE 6 MG PO TABS: 6.0000 mg | ORAL_TABLET | Freq: Every day | ORAL | 0 refills | Status: AC

## 2020-05-29 NOTE — Progress Notes (Addendum)
Patient is for possible home oxygen. Need qualifying oxygen saturations. Talked to [facility] LPN to document 02 sats- if patient qualifies, home oxygen can be arranged. Aneta Mins Transition of Care Supervisor [phone]  3:12pm - MD text paged to sign orders for home oxygen; B Akash Winski RN,MHA,BSN  3:47pm- Referral for home oxygen made to [employer]; they are attempting to contact his spouse for delivery of concentrator; B Nahima Ales RN,MHA,BSN  4:00pm- Received call from Attending MD, pt will not be discharged home today; clipping is still in progress. Zack with [employer] made aware. B Caterina Racine RN,MHA,BSN  4:56pm- Received call from Attending MD- patient has been clipped and has transportation to dialysis. Zack with [employer] called for arrangements of home 02. Mindi Slicker RN,MHA,BSN

## 2020-05-29 NOTE — Progress Notes (Signed)
Patient Saturations on Room Air at Rest = 91%  Patient Saturations on [employer] while Ambulating = 84%  Patient Saturations on 3 Liters of oxygen while Ambulating = 92%   The pt stats dropped once he was ambulating in the room

## 2020-05-29 NOTE — Progress Notes (Signed)
Pt HD txt modified to 3.5hrs by Dr. Lawson Radar.

## 2020-05-29 NOTE — Progress Notes (Signed)
PT Cancellation Note  Patient Details Name: Edward WYNN Sr. MRN: [id number] DOB: 1970-12-05   Cancelled Treatment:    Reason Eval/Treat Not Completed: Patient at procedure or test/unavailable patient currently at dialysis/unavailable for PT session. Will follow acutely and attempt to return if time/schedule allow.   Windell Norfolk, DPT, PN1   Supplemental Physical Therapist [facility]    Pager [phone] Acute Rehab Office [phone]

## 2020-05-29 NOTE — Progress Notes (Signed)
[facility] NEPHROLOGY PROGRESS NOTE  Assessment/ Plan: Pt is a 49 y.o. yo male with history of hypertension, diabetes, CKD 5 follows with me at [facility] is status post right brachiocephalic AV fistula created by Dr. Scot Dock on 04/22/2019, here with Covid pneumonia and worsening renal failure.  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  No problem with AV fistula cannulation.  Third HD today, tolerating so far. He has outpatient HD arranged at [place] unit Monday Wednesday Friday schedule.  Now waiting for some social issues, transportation etc.  Discussed with renal navigator.  Next HD on 8/30.  #Hyperkalemia: Now managed with dialysis.  Potassium level improved.  #Acute hypoxic respiratory failure due to COVID-19 pneumonia: Treatment per primary team.  Getting remdesivir and Decadron.  On supplemental oxygen.  #Pericardial effusion without tamponade.  Cardiac monitoring.  Clinically stable, volume management by dialysis.  #Anemia due to CKD: Hemoglobin at goal.  He gets outpatient ESA injection, no need now.  #Secondary hyperparathyroidism: Continue sevelamer.  #Metabolic acidosis: Discontinued sodium bicarbonate since he is now on dialysis.  #Depression/adjustment disorder: Discussed with the primary team, may benefit from psych evaluation or starting antidepressant.  Subjective: HD today.  No new event.  No nausea vomiting chest pain shortness of breath.  Objective Vital signs in last 24 hours: Vitals:   05/29/20 0900 05/29/20 0930 05/29/20 1000 05/29/20 1030  BP: 130/60 112/71 (!) 95/47 (!) 111/54  Pulse: 88 88 84 83  Resp: 19 16 16 16   Temp:      TempSrc:      SpO2:      Weight:      Height:       Weight change:   Intake/Output Summary (Last 24 hours) at 05/29/2020 1107 Last data filed at 05/29/2020 0600 Gross per 24 hour  Intake 120  ml  Output 1025 ml  Net -905 ml       Labs: Basic Metabolic Panel: Recent Labs  Lab 05/26/20 0220 05/27/20 0520 05/28/20 0436  NA 139 141 138  K 4.4 4.6 4.6  CL 105 106 102  CO2 20* 20* 21*  GLUCOSE 248* 218* 172*  BUN 93* 102* 75*  CREATININE 6.94* 6.39* 5.30*  CALCIUM 8.7* 9.3 8.7*  PHOS 4.7* 4.6 5.0*   Liver Function Tests: Recent Labs  Lab 05/26/20 0220 05/27/20 0520 05/28/20 0436  AST 37 39 57*  ALT 32 32 40  ALKPHOS 60 70 66  BILITOT 0.7 0.7 0.8  PROT 7.0 7.5 7.2  ALBUMIN 2.5* 2.8* 2.7*   No results for input(s): LIPASE, AMYLASE in the last 168 hours. No results for input(s): AMMONIA in the last 168 hours. CBC: Recent Labs  Lab 05/24/20 0652 05/24/20 0652 05/25/20 0716 05/25/20 0716 05/26/20 0220 05/27/20 0520 05/28/20 0436  WBC 7.1   < > 12.6*   < > 9.6 8.6 8.0  NEUTROABS 6.7   < > 11.0*   < > 8.7* 7.4 6.5  HGB 11.3*   < > 12.9*   < > 12.7* 12.7* 13.1  HCT 36.5*   < > 41.3   < > 38.5* 40.1 40.4  MCV 100.8*  --  100.5*  --  97.7 97.1 97.6  PLT 246   < > 307   < > 326 334 287   < > = values in this interval not displayed.   Cardiac Enzymes: Recent Labs  Lab 05/24/20  [place]   CBG: Recent Labs  Lab 05/28/20 0733 05/28/20 1203 05/28/20 1554 05/28/20 2104 05/29/20 0654  GLUCAP 153* 210* 247* 213* 209*    Iron Studies:  Recent Labs    05/27/20 0520  FERRITIN 3,379*   Studies/Results: No results found.  Medications: Infusions:   Scheduled Medications: . Chlorhexidine Gluconate Cloth  6 each Topical Q0600  . dexamethasone  6 mg Oral Daily  . heparin  5,000 Units Subcutaneous Q8H  . insulin aspart  0-6 Units Subcutaneous TID WC  . levothyroxine  137 mcg Oral Q0600  . sevelamer carbonate  800 mg Oral TID WC    have reviewed scheduled and prn medications.  Physical Exam: General:NAD, comfortable, able to lie flat Heart:RRR, s1s2 nl, no rubs Lungs: Clear bilateral, no wheezing or crackle  appreciated Abdomen:soft, Non-tender, non-distended Extremities: No LE edema Dialysis Access: Right upper extremity AV fistula has good thrill and bruit  Mariesa Grieder Tanna Furry 05/29/2020,11:07 AM  LOS: 5 days  Pager: [phone]

## 2020-05-29 NOTE — Progress Notes (Signed)
OT Cancellation Note  Patient Details Name: Edward PETRUCELLI Sr. MRN: [id number] DOB: July 28, 1971   Cancelled Treatment:    Reason Eval/Treat Not Completed: Other (comment). Re-attempted OT session per MD request and planned discharge home today. Lunch cart arriving on COVID hall while OT donning PPE and unable to initiate session as planned.  Per secure chat with MD, pt cognition WFL and behavior more-so from depression due to new HD. Recommend HHOT follow-up with pt at home to maximize independence with daily tasks, especially with new HD. Recommend intermittent supervision and assist from family as needed. No DME needed at this time.  Layla Maw 05/29/2020, 1:21 PM

## 2020-05-29 NOTE — Progress Notes (Signed)
The chaplain responded to a consult. The chaplain briefly spoke with the patient over the phone because of the patient's isolation status for Covid. The chaplain is available to follow-up if the patient would like a visit later.  Brion Aliment Chaplain Resident For questions concerning this note please contact me by pager [phone]

## 2020-05-29 NOTE — TOC Transition Note (Signed)
Transition of Care [facility]) - CM/SW Discharge Note   Patient Details  Name: TITUS DRONE Sr. MRN: [id number] Date of Birth: 1971/05/22  Transition of Care [facility]) CM/SW Contact:  Bartholomew Crews, RN Phone Number: [phone] 05/29/2020, 5:42 PM   Clinical Narrative:     Notified by [facility] supervisor, Denyse Amass, that arrangements had been worked out for transportation home today, and for hemodialysis on MWF. Spoke with Jaclyn Shaggy, renal navigator, who confirmed HD transportation set up with [facility]. MD verified medically ready for transition home. Spoke with Thedore Mins at [employer] about oxygen delivery. Spoke with patient's sister, Ralph Leyden, concerning oxygen needs. Anticipated needing card on file for delivery, however, patient has Medicaid secondary. Oxygen to be delivered to room. Britney at [facility] notified of transition home today. [place] transportation to provide transportation home. No further TOC needs identified.   Final next level of care: [place] Barriers to Discharge: No Barriers Identified   Patient Goals and CMS Choice        Discharge Placement                       Discharge Plan and Services In-house Referral: Clinical Social Work Discharge Planning Services: CM Consult Post Acute Care Choice: Home Health          DME Arranged: Oxygen DME Agency: [facility] Date DME Agency Contacted: 05/29/20 Time DME Agency Contacted: 1700 Representative spoke with at DME Agency: Thedore Mins HH Arranged: RN, PT, OT [facility] Agency: [facility] Date [place]: 05/29/20 Time [place]: 1700 Representative spoke with at [place]: [place] ([place]) Interventions     Readmission Risk Interventions No flowsheet data found.

## 2020-05-29 NOTE — Progress Notes (Signed)
Renal Navigator received call from [place] who reports that transportation has now been confirmed through [employer] for patient while he is COVID positive needing to treat in isolation shift for OP HD. Renal Navigator sent message to [facility], as instructed by [facility] Supervisor, with all necessary information to start transportation services on Monday, 8/30. Navigator called in to patient's room via RN to notify patient. He states he does not recall receiving writing information regarding OP HD clinic schedule from Navigator on Wednesday. Navigator will put information in patient's AVS and informed patient of this. OP HD orders have already been sent to OP HD clinic and Navigator will update that patient will be there Monday for first in-center tx. Inpt team updated.  Alphonzo Cruise, [place] Renal Navigator [phone]

## 2020-05-29 NOTE — Progress Notes (Signed)
OT Cancellation Note  Patient Details Name: Edward MCCLINTOCK Sr. MRN: [id number] DOB: 1971-02-12   Cancelled Treatment:    Reason Eval/Treat Not Completed: Patient at procedure or test/ unavailable. Pt at HD. Will re-attempt OT session as time allows.   Layla Maw 05/29/2020, 10:13 AM

## 2020-05-29 NOTE — Consult Note (Signed)
   [facility] Inpatient Consult   05/29/2020  [place] Oct 23, 1970 [id number]  [place] Organization [ACO] Patient: [facility] Referral:  Inpatient Transition of Care [TOC] , post hospital transition of care issues with transportation to HD noted  Patient evaluated for community based chronic complex disease management services with [place] [facility] as a benefit of patient's [employer]. Call attempts to the patient's hospital room [COVID positive patient] but unable to get him on the phone. Discussed issues regarding referral with inpatient TOC team. TOC notes patient with possible Medicaid and working on transportation needs until strong enough to transport self to center.   Plan:  Patient will be followed for post hospital follow up. Patient has sister for support noted but was living alone prior to admission and was driving too HD per [facility] team.   Of note, [facility] Care Management services does not replace or interfere with any services that are arranged by inpatient case management or social work.  For additional questions or referrals please contact:    Natividad Brood, RN BSN [facility] Liaison  [phone] business mobile phone Toll free office [phone]  Fax number: [phone] [email or web address]

## 2020-05-29 NOTE — Discharge Instructions (Signed)
Dear [name],   Thank you so much for allowing [place] to be part of your care!     POST-HOSPITAL & CARE INSTRUCTIONS 1. Continue decadron for COVID for the next 4 days. 2. Continue to take your Levothyroxine 167mcg daily every morning 1 hour before eating anything or taking any other meds.  3. Take Renvela 1 tablet three times daily with meals. 4. You will be started on Hemodialysis Monday, Wednesday and Friday.  5. Take Atorvastatin 1 tablet daily for high cholesterol.  6. Please let [facility] Doctor/Specialists know of any changes that were made.  7. Please see medications section of this packet for any medication changes.   DOCTOR'S APPOINTMENT & FOLLOW UP CARE INSTRUCTIONS  Future Appointments  Date Time Provider [place]  08/21/2020  9:20 AM Philemon Kingdom, MD [facility] None    RETURN PRECAUTIONS: Shortness of breath, fluid overload, significant weight gain, fevers, chills, and/or new body aches.   Take care and be well!  [facility]  [place], [place] [address] [phone]

## 2020-05-29 NOTE — Progress Notes (Signed)
Family Medicine Teaching Service Daily Progress Note Intern Pager: [phone]  Patient name: Edward [name] Sr. Medical record number: [id number] Date of birth: 1971-07-23 Age: 49 y.o. Gender: male  Primary Care Provider: [name], MD Consultants: Nephro Code Status: FULL  Pt Overview and Major Events to Date:  8/21 admitted 8/22 Echo 8/23 First HD   Assessment and Plan:  Edward Norrisis a 49 y.o.malewho presentedwith shortness of breath secondary to Covid19pneumonia. 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  Acute hypoxic respiratory failure secondary to Covid pneumonia: Patient was breathing 2-3L satting >94%  when I entered room.  He feels stable on his [place].On exam patientlungs sounded clear. Patientmental status has improved. He is AOx4.  Patient is able to get the year but not the month.   Patient was lying on the right side right lower lobe Inspiratory crackles.  Patient is medically stable for discharge.  Will likely go home with oxygen. -Continuous pulse ox -Continuous cardiac monitoring -Follow-up blood cultures,ngtdx5 -Maintain airborne and contact precautions -Decadron6/10 -Remdesivircompleted 5 courses 8/25 -OT eval   HFrEF-EF 50-55% Patient appears euvolemic at this time.Unable to verify home medications but per chart review they seem to include torsemide 100 mg twice daily, as well as Coreg and hydralazine for blood pressure control. -Holding home medication until can verify they are corrector otherwise instructed by cards -Monitor fluid status -Continuous cardiac monitoring/continuous pulse ox  Pericardial effusion: Current BP157/90. Echo remains unchanged from previous echo. There is no evidence of tamponade at this time. Patient continues to have moderately sized circumferential pericardial  effusion. -Continuous cardiac monitor -Continuous pulse ox  ESRD Patient presented with CKD5 and has advanced to ESRD.Cr 9.05> 8.69>6.94>6.39>5.3.K this a.m.4.6.Mg2.1>2.0>2.1>2.0Phos 5.9>4.7>4.6>5.0.Patient having a difficult time adjusting to the idea of HD.  Curbside patient's outpatient nephrologist Dr. Carolin Sicks.  Dr. Carolin Sicks is aware that patient is having a hard time adjusting to HD.   Discharge pending transportation to outpatient HD Monday. - Chaplain consult -Consult SW to get patient set up for outpatient HD -Consult nephro, recs appreciated   Acute metabolic encephalopathy Patient cognitive statusimproved. AOx4. Unclear etiology of encephalopathy. HD initiated 8/23.Patient appears to be having a hard time adjusting to HD mentally. -Continue Synthroid -CT Head   Type 2 diabetes: HgbA1c at admission 6.7.CBG this AM172.Unable to verify home medications but per chart review they appear to be glipizide 2.5 mg/day.Required2U SAI yesterday. - Very sensitive sliding scale insulin - CBGs  Hypothyroidism:  2/2 RAI therapy ofGraves disease.Home medications per chart review Synthroid 137 mcg/day.Cognitive ability appears to be improving. -Continue home Synthroid  Hypertension: Blood pressurethis a.m.157/90. Unable to confirm patient's home blood pressure medication, though per chart review he is listed as taking hydralazine twice per day, torsemide 100 mg twice per day, and Coreg. Patient is newly diagnosed ESRD, we will not lower BP at this time. - Continue to monitor - Consider adding a medication the PM   Anemia Hemoglobin 10.9>11.1>12.7>12.6>13.1. Per chart review baseline appears to be between 9 and 11. Most likely anemia of chronic disease. No current signs of bleeding. -A.m. CBC as above  FEN/GI:Renal diet with carb modifications Prophylaxis:Heparin  Disposition:Med-tele  Subjective:  No overnight event.  Patient reports  that he believes he was a little bit claustrophobic and is ready to go home.  He recognizes that he needs to adjust to the idea of having HD.  Objective: Temp:  [97.8 F (36.6  C)-98.6 F (37 C)] 97.8 F (36.6 C) (08/26 2338) Pulse Rate:  [90-91] 90 (08/26 2338) Resp:  [10-13] 10 (08/26 2338) BP: (137-157)/(90-99) 157/90 (08/26 2338) SpO2:  [85 %-97 %] 97 % (08/26 2338) Physical Exam: General: NAD.  Lying on right side in bed.  Continues to have depressed affect and depressed mood patient reported his mood is "okay."  Does not endorse SI or HI today Cardiovascular: RRR, no murmur detected Respiratory: Right lower lobe inspiratory crackles, good chest expansion, breathing comfortably on 2 to 3 L [place] Abdomen: Normoactive bowel sounds, no pain to palpation Extremities: No edema noted in BLE, distal pulses intact  Laboratory: Recent Labs  Lab 05/26/20 0220 05/27/20 0520 05/28/20 0436  WBC 9.6 8.6 8.0  HGB 12.7* 12.7* 13.1  HCT 38.5* 40.1 40.4  PLT 326 334 287   Recent Labs  Lab 05/26/20 0220 05/27/20 0520 05/28/20 0436  NA 139 141 138  K 4.4 4.6 4.6  CL 105 106 102  CO2 20* 20* 21*  BUN 93* 102* 75*  CREATININE 6.94* 6.39* 5.30*  CALCIUM 8.7* 9.3 8.7*  PROT 7.0 7.5 7.2  BILITOT 0.7 0.7 0.8  ALKPHOS 60 70 66  ALT 32 32 40  AST 37 39 57*  GLUCOSE 248* 218* 172*    Imaging/Diagnostic Tests: None new  [name], MD 05/29/2020, 7:45 AM PGY-1, [place] Intern pager: [phone], text pages welcome

## 2020-05-30 NOTE — Hospital Course (Addendum)
Edward [name] is a 49 y.o. male who presented with shortness of breath secondary to Covid19 pneumonia. 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  Acute hypoxic respiratory failure secondary to Covid pneumonia: Patient Covid + on presentation requiring  6L [place]. Patient presented febrile. CXR at admission was significant for bilateral lower lung hazy opacities suspicious for PNA. Patient placed on remdesivir and finished inpatient. Patient also began decadron regimen. Patient was able to maintain stable O2 sats on 4L [place] later decreased to 3L [place]. Patient began diet as his respiratory status stabilized and O2 needs decreased.  Patient remained on 3L [place] and required with ambulation and at rest. PT and OT saw patient and recommended HHPT, [place].  HFrEF-EF 50-55% Patient received echo which noted HFrEF with EF 50-55%. Patient appeared euvolemic throughout hospitalization.  Pericardial effusion: CXR at admission was significant for enlarged cardiopericardial silhoutte. There was no concern for tamponade on echo.   ESRD Patient presented with hx of CKD 5 however upon admission patient was in ESRD and GFR did improve with fluids. Patient began HD while inpatient. Patient did not mentally handle starting HD well. He appeared depressed after starting and was often found to be refusing food or had poor appetite. Patient also had an episode of refusing his new Renvela and HD; however, he did end up taking the medication and going to HD. Patient had low tone when talking and eventually admitted that it was going to take some time for him to adjust to being a dialysis patient. Physically the patient did not have complications from HD and labs corresponded with HD relief. Patient required outpatient HD placement and transportation to HD. SW was able to confirm both prior to discharge.  Transportation confirmed to be able to take Covid patients.  Acute metabolic encephalopathy At presentation patient appeared encephalopathic. TSH 35 and patient was restarted on home synthroid. There was also a concern for possible uremic encephalopathy and/or hypoxemia causing encephalopathy. Patient orientation slowly resolved throughout course of hospitalization. Patient was receiving HD and stabilized on 3L [place] when significant improvement in mentation occurred.   Type 2 diabetes:  Patient CBG monitored inpatient and very sensitive sliding scale.   Hypothyroidism:  TSH 35 at presentation restarted home synthroid.   Hypertension: Patient BP normotensive-hypertensive. Due to patient CKD advancing to ESRD did not attempt to start hypertensive medications.  Anemia Patient had mild anemia at presentation resolved to 13.1 without targeted treatment by discharge.

## 2020-06-01 ENCOUNTER — Other Ambulatory Visit: Payer: Self-pay | Admitting: *Deleted

## 2020-06-01 DIAGNOSIS — Z992 Dependence on renal dialysis: Secondary | ICD-10-CM | POA: Diagnosis not present

## 2020-06-01 DIAGNOSIS — N186 End stage renal disease: Secondary | ICD-10-CM | POA: Diagnosis not present

## 2020-06-01 NOTE — Discharge Summary (Addendum)
[facility] Discharge Summary  Patient name: Edward GUMINA Sr. Medical record number: [id number] Date of birth: 1971/08/24 Age: 49 y.o. Gender: male Date of Admission: 05/23/2020  Date of Discharge: 05/29/2020  Admitting Physician: Blane Ohara McDiarmid, MD  Primary Care Provider: [name], MD Consultants: Nephro  Indication for Hospitalization: Covid 19+, new ESRD requiring HD  Discharge Diagnoses/Problem List:  Principal Problem:   Pneumonia due to COVID-19 virus Active Problems:   DM (diabetes mellitus), type 2 with ophthalmic complications ([place])   Acute on chronic renal failure (HCC)   Non compliance with medical treatment   Chronic kidney disease, stage 5 ([place])   Postablative hypothyroidism   Hypertension associated with stage 5 chronic kidney disease due to type 2 diabetes mellitus ([place])   Hyperphosphatemia   Proteinuria   Legally blind   Diabetic retinopathy of both eyes ([place])   Acute metabolic encephalopathy   Acute hypoxemic respiratory failure due to COVID-19 ([place])   ESRD (end stage renal disease) on dialysis [facility])   Disposition: Home  Discharge Condition: Stable  Discharge Exam:  General: NAD.  Lying on right side in bed.  Continues to have depressed affect and depressed mood patient reported his mood is "okay."  Does not endorse SI or HI today Cardiovascular: RRR, no murmur detected Respiratory: Right lower lobe inspiratory crackles, good chest expansion, breathing comfortably on 2 to 3 L [place] Abdomen: Normoactive bowel sounds, no pain to palpation Extremities: No edema noted in BLE, distal pulses intact  Brief Hospital Course:  Edward MATUS Sr. is a 49 y.o. male who presented with shortness of breath secondary to Covid19 pneumonia. 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  Acute hypoxic respiratory  failure secondary to Covid pneumonia: Patient Covid + on presentation requiring  6L [place]. Patient presented febrile. CXR at admission was significant for bilateral lower lung hazy opacities suspicious for PNA. Patient placed on remdesivir and finished inpatient. Patient also began decadron regimen. Patient was able to maintain stable O2 sats on 4L [place] later decreased to 3L [place]. Patient began diet as his respiratory status stabilized and O2 needs decreased.  Patient remained on 3L [place] and required with ambulation and at rest. PT and OT saw patient and recommended HHPT, [place].  HFrEF-EF 50-55% Patient received echo which noted HFrEF with EF 50-55%. Patient appeared euvolemic throughout hospitalization.  Pericardial effusion: CXR at admission was significant for enlarged cardiopericardial silhoutte. There was no concern for tamponade on echo.   ESRD Patient presented with hx of CKD 5 however upon admission patient was in ESRD and GFR did improve with fluids. Patient began HD while inpatient. Patient did not mentally handle starting HD well. He appeared depressed after starting and was often found to be refusing food or had poor appetite. Patient also had an episode of refusing his new Renvela and HD; however, he did end up taking the medication and going to HD. Patient had low tone when talking and eventually admitted that it was going to take some time for him to adjust to being a dialysis patient. Physically the patient did not have complications from HD and labs corresponded with HD relief. Patient required outpatient HD placement and transportation to HD. SW was able to confirm both prior to discharge. Transportation confirmed to be able to take Covid patients.  Acute metabolic encephalopathy At presentation patient appeared encephalopathic. TSH 35 and patient  was restarted on home synthroid. There was also a concern for possible uremic encephalopathy and/or hypoxemia causing encephalopathy. Patient  orientation slowly resolved throughout course of hospitalization. Patient was receiving HD and stabilized on 3L [place] when significant improvement in mentation occurred.   Type 2 diabetes:  Patient CBG monitored inpatient and very sensitive sliding scale.   Hypothyroidism:  TSH 35 at presentation restarted home synthroid.   Hypertension: Patient BP normotensive-hypertensive. Due to patient CKD advancing to ESRD did not attempt to start hypertensive medications.  Anemia Patient had mild anemia at presentation resolved to 13.1 without targeted treatment by discharge.   Issues for Follow Up:  Consider addressing adjustment to being on HD. There was concern for depression and adjustment disorder due to starting HD.   Significant Procedures:  8/23 First HD  Significant Labs and Imaging:  Recent Labs  Lab 05/26/20 0220 05/27/20 0520 05/28/20 0436  WBC 9.6 8.6 8.0  HGB 12.7* 12.7* 13.1  HCT 38.5* 40.1 40.4  PLT 326 334 287   Recent Labs  Lab 05/26/20 0220 05/26/20 0220 05/27/20 0520 05/28/20 0436  NA 139  --  141 138  K 4.4   < > 4.6 4.6  CL 105  --  106 102  CO2 20*  --  20* 21*  GLUCOSE 248*  --  218* 172*  BUN 93*  --  102* 75*  CREATININE 6.94*  --  6.39* 5.30*  CALCIUM 8.7*  --  9.3 8.7*  MG 2.0  --  2.1 2.0  PHOS 4.7*  --  4.6 5.0*  ALKPHOS 60  --  70 66  AST 37  --  39 57*  ALT 32  --  32 40  ALBUMIN 2.5*  --  2.8* 2.7*   < > = values in this interval not displayed.   05/23/2020- CXR IMPRESSION: 1. Significant enlargement of the cardiopericardial silhouette which may represent cardiomegaly and/or pericardial effusion. 2. Bilateral LOWER lung hazy opacities suspicious for Infection/pneumonia.  05/26/2020- CT Head IMPRESSION: 1. No evidence of acute intracranial abnormality. 2. Sinusitis.   Results/Tests Pending at Time of Discharge: None  Discharge Medications:  Allergies as of 05/29/2020   No Known Allergies      Medication List     STOP taking  these medications    calcitRIOL 0.5 MCG capsule Commonly known as: ROCALTROL   carvedilol 3.125 MG tablet Commonly known as: COREG   glipiZIDE 5 MG tablet Commonly known as: GLUCOTROL   glycopyrrolate 2 MG tablet Commonly known as: ROBINUL   hydrALAZINE 50 MG tablet Commonly known as: APRESOLINE   sildenafil 50 MG tablet Commonly known as: Viagra   torsemide 100 MG tablet Commonly known as: DEMADEX       TAKE these medications    Accu-Chek Guide Me w/Device Kit Use 3 times daily before meals   Accu-Chek Guide test strip Generic drug: glucose blood Use as instructed   acetaminophen 500 MG tablet Commonly known as: TYLENOL Take 500 mg by mouth every 6 (six) hours as needed.   atorvastatin 40 MG tablet Commonly known as: LIPITOR Take 1 tablet (40 mg total) by mouth daily.   B-D SINGLE USE SWABS REGULAR Pads Use as directed.   dexamethasone 6 MG tablet Commonly known as: DECADRON Take 1 tablet (6 mg total) by mouth daily for 4 days.   diclofenac sodium 1 % Gel Commonly known as: VOLTAREN Apply 2 g topically 3 (three) times daily as needed (arthritic pain).   levothyroxine 137 MCG  tablet Commonly known as: SYNTHROID TAKE 1 TABLET(137 MCG) BY MOUTH DAILY BEFORE BREAKFAST What changed: See the new instructions.   sevelamer carbonate 800 MG tablet Commonly known as: RENVELA Take 1 tablet (800 mg total) by mouth 3 (three) times daily with meals.   TRUEplus Lancets 33G Misc Use as instructed to check blood sugar daily.               Durable Medical Equipment  (From admission, onward)           Start     Ordered   05/28/20 0000  For home use only DME oxygen       Question Answer Comment  Length of Need 12 Months   Mode or (Route) Nasal cannula   Oxygen delivery system Gas      05/28/20 0754            Discharge Instructions: Please refer to Patient Instructions section of EMR for full details.  Patient was counseled important signs  and symptoms that should prompt return to medical care, changes in medications, dietary instructions, activity restrictions, and follow up appointments.   Follow-Up Appointments:  Follow-up Information     [facility] Follow up on 06/01/2020.   Why: Outpatient Dialysis: Monday, Wednesday, Friday 5:00pm from 06/01/20-06/12/20, then your schedule will change and your clinic will provide you will that information. [facility] will pick you up at your home while on this schedule. Please be ready! Contact information: [place] [address] [phone]                 [name], MD 06/01/2020, 10:42 AM PGY-1, [place] Upper-Level Resident Addendum  I have independently interviewed and examined the patient. I have discussed the above with the original author and agree with their documentation. My edits for correction/addition/clarification are in italics. Please see also any attending notes.    Milus Banister, DO PGY-3, [facility] 06/01/2020 12:01 PM  FPTS Service pager: [phone] (text pages welcome through [facility])

## 2020-06-01 NOTE — Patient Outreach (Addendum)
[place] [facility]) Care Management  06/01/2020  Edward HELLMER Sr. 1971-09-25 [id number]   CSW received referral on 05/29/2020 for transportation assistance.  CSW made contact  Terri Piedra, LCSW, Renal Navigator, who advised pt is set up with  [facility] services for a ride to his HD appointments; beginning today for MWF 5pm treatment time.  CSW also made contact with pt today and confirmed his identity.  CSW introduced self, role and reason for call.  Per pt, he has received a call just a few minutes ago confirming his ride to HD today.  "Somewhere between now and 4:30 they will pick me up".  CSW also inquired with pt about his interest/need for meal support. Pt agreeable to referral to the [facility] program where he is eligible for post hospitalization meal delivery (14 meals delivered).  CSW has made referral and advised pt to expect a phone call from them as well as [employer] delivery of meals on this Thursday.   CSW will plan to touch base with pt again later in the week when he is not pressed for time with the ride to HD.   Eduard Clos, MSW, [place] Worker  [place] [phone]

## 2020-06-02 ENCOUNTER — Telehealth: Payer: Self-pay

## 2020-06-02 NOTE — Telephone Encounter (Signed)
Transition Care Management Follow-up Telephone Call Date of discharge and from where: 05/29/2020, [facility]   Call placed to patient # [phone], message left with call back requested to this CM # [phone].  Patient needs to schedule follow up appointment with PCP.

## 2020-06-02 NOTE — Telephone Encounter (Signed)
Opened in error

## 2020-06-03 ENCOUNTER — Telehealth: Payer: Self-pay

## 2020-06-03 ENCOUNTER — Other Ambulatory Visit: Payer: Self-pay | Admitting: *Deleted

## 2020-06-03 DIAGNOSIS — U071 COVID-19: Secondary | ICD-10-CM | POA: Diagnosis not present

## 2020-06-03 DIAGNOSIS — N2581 Secondary hyperparathyroidism of renal origin: Secondary | ICD-10-CM | POA: Diagnosis not present

## 2020-06-03 DIAGNOSIS — Z992 Dependence on renal dialysis: Secondary | ICD-10-CM | POA: Diagnosis not present

## 2020-06-03 DIAGNOSIS — N186 End stage renal disease: Secondary | ICD-10-CM | POA: Diagnosis not present

## 2020-06-03 NOTE — Patient Outreach (Signed)
[facility]) Care Management  06/03/2020  Edward [name] Sr. 1971-04-23 [id number]   CSW spoke with pt by phone today- he was not aware of plans for HD today.  CSW advised him of plans for MWF/5pm treatment schedule with transportation arranged.  Pt reports his ride on Monday went well as arranged.  CSW also reminded pt of [employer] meal program/delivery set up by CSW and to expect an UPS delivery today of 10- 14 meals.  CSW inquired with pt if there were any current concerns or needs; pt states, "this is gonna take some getting use to".  CSW validated his remarks and feelings and reminded him he has a team of support from his healthcare team and to call if needs arise.  CSW will plan to touch base with pt again Friday to remind and ensure of HD and transportation.    Eduard Clos, MSW, [place] Worker  [place] [phone]

## 2020-06-03 NOTE — Telephone Encounter (Signed)
Transition Care Management Follow-up Telephone Call  Date of discharge and from where: 05/29/2020, [facility]   How have you been since you were released from the hospital? The patient was very slow to respond to questions and some questions he did not answer even when asked multiple times. Affect flat.   When asked how he is feeling he said " guess all right."    Any questions or concerns?  he said he had no questions/concerns at this time  After speaking with the patient , this CM contacted Terri Piedra, LCSW Renal Navigator and explained the conversation with patient.  She said that she got off of the phone with the patient and she was questioning depression v. cognitive delay v. confusion as she experienced the same interaction with the patient that this CM experienced.  She stated that she encouraged the patient to speak about his thoughts/feelings/concerns and she has made the HD clinic SW and clinic  manager aware of these concerns about him.  This CM explained about his confusion regarding his medications and the fact that he said he did not have discharge instructions.  She said that she would fax a copy of the AVS to HD for the staff there to give to him and to review the medication list.  She also said she would instruct them to call this CM if they have any questions. If he does not have his meds, there may be an option for med delivery.   Items Reviewed:  Did the pt receive and understand the discharge instructions provided?  he said he wasn't given anything at discharge.   Medications obtained and verified?  he said he is not sure what medications he had. He said he had no list of meds and he could not stated if he had any medications at all. He said that he only takes what dialysis tells him to take.   Any new allergies since your discharge?  none reported   Do you have support at home?  he stated that he understands he is to remain quarantined.  He said he has a little help  from others.  He has as glucometer but said that he doesn't do it himself and when asked about knowing how to use it, he didn't answer.  He does not have any DME but stated he could use a walker or cane.   Home health was ordered through [facility] - PT/OT.  He said that someone called but he was not sure who.   Home O2 that he stated he uses it as much as he can.  Home meal delivery is to start today.  HD M/W/F @ [address].  Time will change when he is out of quarantine.   Functional Questionnaire: (I = Independent and D = Dependent) ADLs: he did not answer  Follow up appointments reviewed:   [facility] Hospital f/u appt confirmed?televisit with Dr Chapman Fitch 06/05/2020  [facility] f/u appt confirmed? endocrinology - 08/21/2020  Are transportation arrangements needed?  not at this time. [facility] has been arranged for HD through 06/12/2020.  As per [facility], the patient has been informed that he will need to schedule transportation at that time.  He may need to apply for [employer] [place] rides.  If eligible for medicaid, he would be eligible for rides to HD sessions.   If their condition worsens, is the pt aware to call PCP or go to the Emergency Dept.? yes  Was the patient provided with  contact information for the PCP's office or ED?  yes  Was to pt encouraged to call back with questions or concerns? yes

## 2020-06-03 NOTE — Telephone Encounter (Addendum)
Renal Navigator was contacted by [facility] CSW/J. Marcelline Deist that patient was not aware that he had to go to HD again today. Patient had been given written OP HD schedule information and Navigator put information in AVS. Navigator had also spoken to patient via RN staff in his room to explain plan. Navigator called patient to review. He stated that he does not recall letter and did not receive AVS at discharge. Navigator explained COVID isolation shift and that he will receive a normal seat schedule from the clinic staff while he is treating in isolation. Navigator also informed patient that [employer] has only been arranged through 9/10, which is when he can come out of isolation, unless he receives 2 negative COVID tests prior to that date--21 day isolation period explained. Patient states he tested positive prior to hospital admission, so 21 day period is sooner than 9/10. Navigator asked him to take documentation of his positive test to his OP HD clinic. Navigator unsure if he has this documentation. Navigator explained that if he does not feel up to driving himself to his OP HD clinic after iso shift, he can apply for [employer] [place] with assistance from HD clinic staff or use Medicaid transportation for free if he has Medicaid. He stated understanding. Navigator asked how he is doing and acknowledged that what he is currently going through is a lot to adjust to. He stated, "I'm hanging on." Navigator discussed the importance of mental health in addition to physical health, reviewed community resources available to him, and encouraged him to speak up if he feels he needs support.  Navigator is concerned that patient was not aware that he needed continued HD and about patient's mental health. He was slow in responses, though seemed understanding of the information provided. Navigator is aware that depression was addressed by inpatient attending and patient declined SSRI. Navigator contacted Emilie Rutter clinic  manager to pass along concerns and to ask that if AVS is faxed to clinic, that they give it to patient. Clinic manager agreed and appreciative of call.  Renal Navigator collaborated with [employer] CSW and CM/Jane B at [employer]. She was informed of plan to get patient his AVS, especially to address starting and stopping medications. Opal Sidles states patient will have a telehealth visit on Friday.  Alphonzo Cruise, [place] Renal Navigator [phone]

## 2020-06-03 NOTE — Telephone Encounter (Signed)
Transition Care Management Follow-up Telephone Call Attempt # 2 Date of discharge and from where: 05/29/2020, [facility]   Call placed to patient # [phone], message left with call back requested to this CM # [phone]. Letter then sent to patient requesting he call the clinic to schedule a follow up appointment with PCP.

## 2020-06-04 DIAGNOSIS — N185 Chronic kidney disease, stage 5: Secondary | ICD-10-CM | POA: Diagnosis not present

## 2020-06-04 DIAGNOSIS — E1122 Type 2 diabetes mellitus with diabetic chronic kidney disease: Secondary | ICD-10-CM | POA: Diagnosis not present

## 2020-06-04 DIAGNOSIS — U071 COVID-19: Secondary | ICD-10-CM | POA: Diagnosis not present

## 2020-06-04 DIAGNOSIS — I120 Hypertensive chronic kidney disease with stage 5 chronic kidney disease or end stage renal disease: Secondary | ICD-10-CM | POA: Diagnosis not present

## 2020-06-04 DIAGNOSIS — M1990 Unspecified osteoarthritis, unspecified site: Secondary | ICD-10-CM | POA: Diagnosis not present

## 2020-06-04 DIAGNOSIS — E1142 Type 2 diabetes mellitus with diabetic polyneuropathy: Secondary | ICD-10-CM | POA: Diagnosis not present

## 2020-06-04 DIAGNOSIS — I5032 Chronic diastolic (congestive) heart failure: Secondary | ICD-10-CM | POA: Diagnosis not present

## 2020-06-04 DIAGNOSIS — J1282 Pneumonia due to coronavirus disease 2019: Secondary | ICD-10-CM | POA: Diagnosis not present

## 2020-06-04 DIAGNOSIS — D631 Anemia in chronic kidney disease: Secondary | ICD-10-CM | POA: Diagnosis not present

## 2020-06-05 ENCOUNTER — Ambulatory Visit: Payer: Medicare PPO | Admitting: Family Medicine

## 2020-06-05 ENCOUNTER — Other Ambulatory Visit: Payer: Self-pay | Admitting: *Deleted

## 2020-06-05 DIAGNOSIS — M1990 Unspecified osteoarthritis, unspecified site: Secondary | ICD-10-CM | POA: Diagnosis not present

## 2020-06-05 DIAGNOSIS — I5032 Chronic diastolic (congestive) heart failure: Secondary | ICD-10-CM | POA: Diagnosis not present

## 2020-06-05 DIAGNOSIS — E1122 Type 2 diabetes mellitus with diabetic chronic kidney disease: Secondary | ICD-10-CM | POA: Diagnosis not present

## 2020-06-05 DIAGNOSIS — J1282 Pneumonia due to coronavirus disease 2019: Secondary | ICD-10-CM | POA: Diagnosis not present

## 2020-06-05 DIAGNOSIS — D631 Anemia in chronic kidney disease: Secondary | ICD-10-CM | POA: Diagnosis not present

## 2020-06-05 DIAGNOSIS — E1142 Type 2 diabetes mellitus with diabetic polyneuropathy: Secondary | ICD-10-CM | POA: Diagnosis not present

## 2020-06-05 DIAGNOSIS — I120 Hypertensive chronic kidney disease with stage 5 chronic kidney disease or end stage renal disease: Secondary | ICD-10-CM | POA: Diagnosis not present

## 2020-06-05 DIAGNOSIS — N185 Chronic kidney disease, stage 5: Secondary | ICD-10-CM | POA: Diagnosis not present

## 2020-06-05 DIAGNOSIS — U071 COVID-19: Secondary | ICD-10-CM | POA: Diagnosis not present

## 2020-06-05 NOTE — Patient Outreach (Signed)
[place]) Care Management  06/05/2020  Edward [name] Sr. 27-Dec-1970 [id number]   CSW made contact with pt who said his rides to the HD this week have gone well.  Reminded pt he has the same schedule and arrangements for rides to HD next week as well.  Pt also confirms the [employer] meals were delivered and he has had one and found it to be good.  CSW advised pt that per [facility] he may be eligible for a "chronic care meal benefit" and we will discuss further next week.   CSW inquired with pt about how he is coping with his medical needs.  Pt states, "Just trying to wrap my head around all this".  CSW validated his feelings and discussed the importance of him asking questions when he needs to ensure he understands what is happening, as well as to help ease any concern, fear, etc.  CSW reminded him the team of healthcare workers are all here to help and support him as he needs.  Pt appreciative of support and reminded to call if needs arise before next CSW follow up call.     Eduard Clos, MSW, [place] Worker  [place] [phone]

## 2020-06-09 DIAGNOSIS — J1282 Pneumonia due to coronavirus disease 2019: Secondary | ICD-10-CM | POA: Diagnosis not present

## 2020-06-09 DIAGNOSIS — I5032 Chronic diastolic (congestive) heart failure: Secondary | ICD-10-CM | POA: Diagnosis not present

## 2020-06-09 DIAGNOSIS — E1142 Type 2 diabetes mellitus with diabetic polyneuropathy: Secondary | ICD-10-CM | POA: Diagnosis not present

## 2020-06-09 DIAGNOSIS — N185 Chronic kidney disease, stage 5: Secondary | ICD-10-CM | POA: Diagnosis not present

## 2020-06-09 DIAGNOSIS — I120 Hypertensive chronic kidney disease with stage 5 chronic kidney disease or end stage renal disease: Secondary | ICD-10-CM | POA: Diagnosis not present

## 2020-06-09 DIAGNOSIS — M1990 Unspecified osteoarthritis, unspecified site: Secondary | ICD-10-CM | POA: Diagnosis not present

## 2020-06-09 DIAGNOSIS — U071 COVID-19: Secondary | ICD-10-CM | POA: Diagnosis not present

## 2020-06-09 DIAGNOSIS — D631 Anemia in chronic kidney disease: Secondary | ICD-10-CM | POA: Diagnosis not present

## 2020-06-09 DIAGNOSIS — E1122 Type 2 diabetes mellitus with diabetic chronic kidney disease: Secondary | ICD-10-CM | POA: Diagnosis not present

## 2020-06-10 DIAGNOSIS — Z992 Dependence on renal dialysis: Secondary | ICD-10-CM | POA: Diagnosis not present

## 2020-06-10 DIAGNOSIS — N186 End stage renal disease: Secondary | ICD-10-CM | POA: Diagnosis not present

## 2020-06-10 DIAGNOSIS — N2581 Secondary hyperparathyroidism of renal origin: Secondary | ICD-10-CM | POA: Diagnosis not present

## 2020-06-11 ENCOUNTER — Other Ambulatory Visit: Payer: Self-pay | Admitting: *Deleted

## 2020-06-11 ENCOUNTER — Telehealth: Payer: Self-pay | Admitting: Family Medicine

## 2020-06-11 ENCOUNTER — Ambulatory Visit: Payer: Self-pay | Admitting: *Deleted

## 2020-06-11 DIAGNOSIS — I5032 Chronic diastolic (congestive) heart failure: Secondary | ICD-10-CM | POA: Diagnosis not present

## 2020-06-11 DIAGNOSIS — D631 Anemia in chronic kidney disease: Secondary | ICD-10-CM | POA: Diagnosis not present

## 2020-06-11 DIAGNOSIS — E1122 Type 2 diabetes mellitus with diabetic chronic kidney disease: Secondary | ICD-10-CM | POA: Diagnosis not present

## 2020-06-11 DIAGNOSIS — U071 COVID-19: Secondary | ICD-10-CM | POA: Diagnosis not present

## 2020-06-11 DIAGNOSIS — J1282 Pneumonia due to coronavirus disease 2019: Secondary | ICD-10-CM | POA: Diagnosis not present

## 2020-06-11 DIAGNOSIS — M1990 Unspecified osteoarthritis, unspecified site: Secondary | ICD-10-CM | POA: Diagnosis not present

## 2020-06-11 DIAGNOSIS — N185 Chronic kidney disease, stage 5: Secondary | ICD-10-CM | POA: Diagnosis not present

## 2020-06-11 DIAGNOSIS — I120 Hypertensive chronic kidney disease with stage 5 chronic kidney disease or end stage renal disease: Secondary | ICD-10-CM | POA: Diagnosis not present

## 2020-06-11 DIAGNOSIS — E1142 Type 2 diabetes mellitus with diabetic polyneuropathy: Secondary | ICD-10-CM | POA: Diagnosis not present

## 2020-06-11 NOTE — Telephone Encounter (Signed)
Forwarded to provider for review.

## 2020-06-11 NOTE — Telephone Encounter (Unsigned)
Copied from [place] [phone]. Topic: General - Other >> Jun 11, 2020  3:10 PM Celene Kras wrote: Reason for CRM: Andee Poles, NP with [employer], called stating that she just finished an at home assessment. She states that she is concerned that the pt has prescribed medications, but that he has mentioned that he has not been taking them. She states that the pt states that he is confused about which medications he should be taking. Please advise.

## 2020-06-11 NOTE — Patient Outreach (Signed)
[facility]) Care Management  06/11/2020  OWEN PRATTE Sr. 31-Mar-1971 [id number]   CSW attempted to reach pt today and was unsuccessful.  CSW left a HIPPA compliant voice message and will attempt outreach again in 3-4 business days if no return call is received.   Eduard Clos, MSW, [place] Worker  [place] [phone]

## 2020-06-11 NOTE — Telephone Encounter (Signed)
Patient was called and a voicemail was left informing him to return phone call.

## 2020-06-12 DIAGNOSIS — N186 End stage renal disease: Secondary | ICD-10-CM | POA: Diagnosis not present

## 2020-06-12 DIAGNOSIS — N2581 Secondary hyperparathyroidism of renal origin: Secondary | ICD-10-CM | POA: Diagnosis not present

## 2020-06-12 DIAGNOSIS — Z992 Dependence on renal dialysis: Secondary | ICD-10-CM | POA: Diagnosis not present

## 2020-06-15 DIAGNOSIS — Z992 Dependence on renal dialysis: Secondary | ICD-10-CM | POA: Diagnosis not present

## 2020-06-15 DIAGNOSIS — N186 End stage renal disease: Secondary | ICD-10-CM | POA: Diagnosis not present

## 2020-06-15 DIAGNOSIS — N2581 Secondary hyperparathyroidism of renal origin: Secondary | ICD-10-CM | POA: Diagnosis not present

## 2020-06-16 ENCOUNTER — Telehealth: Payer: Self-pay

## 2020-06-16 ENCOUNTER — Other Ambulatory Visit: Payer: Self-pay | Admitting: *Deleted

## 2020-06-16 DIAGNOSIS — I120 Hypertensive chronic kidney disease with stage 5 chronic kidney disease or end stage renal disease: Secondary | ICD-10-CM | POA: Diagnosis not present

## 2020-06-16 DIAGNOSIS — I5032 Chronic diastolic (congestive) heart failure: Secondary | ICD-10-CM | POA: Diagnosis not present

## 2020-06-16 DIAGNOSIS — E1142 Type 2 diabetes mellitus with diabetic polyneuropathy: Secondary | ICD-10-CM | POA: Diagnosis not present

## 2020-06-16 DIAGNOSIS — M1990 Unspecified osteoarthritis, unspecified site: Secondary | ICD-10-CM | POA: Diagnosis not present

## 2020-06-16 DIAGNOSIS — J1282 Pneumonia due to coronavirus disease 2019: Secondary | ICD-10-CM | POA: Diagnosis not present

## 2020-06-16 DIAGNOSIS — N185 Chronic kidney disease, stage 5: Secondary | ICD-10-CM | POA: Diagnosis not present

## 2020-06-16 DIAGNOSIS — D631 Anemia in chronic kidney disease: Secondary | ICD-10-CM | POA: Diagnosis not present

## 2020-06-16 DIAGNOSIS — E1122 Type 2 diabetes mellitus with diabetic chronic kidney disease: Secondary | ICD-10-CM | POA: Diagnosis not present

## 2020-06-16 DIAGNOSIS — U071 COVID-19: Secondary | ICD-10-CM | POA: Diagnosis not present

## 2020-06-16 NOTE — Telephone Encounter (Signed)
Pt returning your call.  Unable to reach office.  Please call back.

## 2020-06-16 NOTE — Telephone Encounter (Signed)
Patient was called and a voicemail was left informing patient that he should be taking the medications that he was discharged on 05/29/20

## 2020-06-16 NOTE — Patient Outreach (Addendum)
[place] [facility]) Care Management  06/16/2020  Edward GROPP Sr. Jun 18, 1971 [id number]   CSW spoke with pt who was in better spirits with a stronger voice and appears more optimistic.  Pt shared with CSW that his HD has changed to MWF and he understands from other involved provider, the transportation is aware.  Pt states he feeling "much better".  He has met the Education officer, museum at the HD center "briefly on yesterday".  CSW reminded him to ask to speak with the HD CSW as needed for concerns or needs.  CSW will touch base with HD clinic CSW as well as Renal Navigator.  CSW will sign off at this time and advise PCP and [facility] team of above.     ADDENDUM: CSW spoke with Debi, SW at HD clinic and provided her with info on pt's [employer] coverage which provided him with meal delivery at dc and that he is eligible for their "SSBCI" program for chronic care meal delivery.  Debi will provide this info to their Dietician as well as with pt again. Eduard Clos, MSW, [place] Worker  [place] [phone]

## 2020-06-16 NOTE — Telephone Encounter (Signed)
Tillie Rung will [facility] called and she was given verbal orders for patient for skilled nursing.  She would like to hold of on his PT due to his current foot pain. Tillie Rung states that she think he may have a gout flare at this time.

## 2020-06-17 DIAGNOSIS — Z992 Dependence on renal dialysis: Secondary | ICD-10-CM | POA: Diagnosis not present

## 2020-06-17 DIAGNOSIS — N2581 Secondary hyperparathyroidism of renal origin: Secondary | ICD-10-CM | POA: Diagnosis not present

## 2020-06-17 DIAGNOSIS — N186 End stage renal disease: Secondary | ICD-10-CM | POA: Diagnosis not present

## 2020-06-19 DIAGNOSIS — N2581 Secondary hyperparathyroidism of renal origin: Secondary | ICD-10-CM | POA: Diagnosis not present

## 2020-06-19 DIAGNOSIS — J1282 Pneumonia due to coronavirus disease 2019: Secondary | ICD-10-CM | POA: Diagnosis not present

## 2020-06-19 DIAGNOSIS — E1142 Type 2 diabetes mellitus with diabetic polyneuropathy: Secondary | ICD-10-CM | POA: Diagnosis not present

## 2020-06-19 DIAGNOSIS — U071 COVID-19: Secondary | ICD-10-CM | POA: Diagnosis not present

## 2020-06-19 DIAGNOSIS — N185 Chronic kidney disease, stage 5: Secondary | ICD-10-CM | POA: Diagnosis not present

## 2020-06-19 DIAGNOSIS — Z992 Dependence on renal dialysis: Secondary | ICD-10-CM | POA: Diagnosis not present

## 2020-06-19 DIAGNOSIS — M1990 Unspecified osteoarthritis, unspecified site: Secondary | ICD-10-CM | POA: Diagnosis not present

## 2020-06-19 DIAGNOSIS — N186 End stage renal disease: Secondary | ICD-10-CM | POA: Diagnosis not present

## 2020-06-19 DIAGNOSIS — I5032 Chronic diastolic (congestive) heart failure: Secondary | ICD-10-CM | POA: Diagnosis not present

## 2020-06-19 DIAGNOSIS — E1122 Type 2 diabetes mellitus with diabetic chronic kidney disease: Secondary | ICD-10-CM | POA: Diagnosis not present

## 2020-06-19 DIAGNOSIS — I120 Hypertensive chronic kidney disease with stage 5 chronic kidney disease or end stage renal disease: Secondary | ICD-10-CM | POA: Diagnosis not present

## 2020-06-19 DIAGNOSIS — D631 Anemia in chronic kidney disease: Secondary | ICD-10-CM | POA: Diagnosis not present

## 2020-06-22 DIAGNOSIS — N186 End stage renal disease: Secondary | ICD-10-CM | POA: Diagnosis not present

## 2020-06-22 DIAGNOSIS — N2581 Secondary hyperparathyroidism of renal origin: Secondary | ICD-10-CM | POA: Diagnosis not present

## 2020-06-22 DIAGNOSIS — Z992 Dependence on renal dialysis: Secondary | ICD-10-CM | POA: Diagnosis not present

## 2020-06-23 DIAGNOSIS — E1122 Type 2 diabetes mellitus with diabetic chronic kidney disease: Secondary | ICD-10-CM | POA: Diagnosis not present

## 2020-06-23 DIAGNOSIS — M1990 Unspecified osteoarthritis, unspecified site: Secondary | ICD-10-CM | POA: Diagnosis not present

## 2020-06-23 DIAGNOSIS — J1282 Pneumonia due to coronavirus disease 2019: Secondary | ICD-10-CM | POA: Diagnosis not present

## 2020-06-23 DIAGNOSIS — I120 Hypertensive chronic kidney disease with stage 5 chronic kidney disease or end stage renal disease: Secondary | ICD-10-CM | POA: Diagnosis not present

## 2020-06-23 DIAGNOSIS — E1142 Type 2 diabetes mellitus with diabetic polyneuropathy: Secondary | ICD-10-CM | POA: Diagnosis not present

## 2020-06-23 DIAGNOSIS — I5032 Chronic diastolic (congestive) heart failure: Secondary | ICD-10-CM | POA: Diagnosis not present

## 2020-06-23 DIAGNOSIS — N185 Chronic kidney disease, stage 5: Secondary | ICD-10-CM | POA: Diagnosis not present

## 2020-06-23 DIAGNOSIS — U071 COVID-19: Secondary | ICD-10-CM | POA: Diagnosis not present

## 2020-06-23 DIAGNOSIS — D631 Anemia in chronic kidney disease: Secondary | ICD-10-CM | POA: Diagnosis not present

## 2020-06-24 DIAGNOSIS — N186 End stage renal disease: Secondary | ICD-10-CM | POA: Diagnosis not present

## 2020-06-24 DIAGNOSIS — U071 COVID-19: Secondary | ICD-10-CM | POA: Diagnosis not present

## 2020-06-24 DIAGNOSIS — N2581 Secondary hyperparathyroidism of renal origin: Secondary | ICD-10-CM | POA: Diagnosis not present

## 2020-06-24 DIAGNOSIS — R2689 Other abnormalities of gait and mobility: Secondary | ICD-10-CM | POA: Diagnosis not present

## 2020-06-24 DIAGNOSIS — Z992 Dependence on renal dialysis: Secondary | ICD-10-CM | POA: Diagnosis not present

## 2020-06-24 DIAGNOSIS — M1990 Unspecified osteoarthritis, unspecified site: Secondary | ICD-10-CM | POA: Diagnosis not present

## 2020-06-25 ENCOUNTER — Telehealth: Payer: Self-pay

## 2020-06-25 NOTE — Telephone Encounter (Signed)
Received phone call from [facility] regarding CMN form for oxygen post hospitalization. Edward [name] states that form was faxed over on 9/17. Unable to find in provider box. Requested that she resend form to be completed.   Advised that patient is not under our care for primary care, however, was under the care of Dr. Ardelia Mems during hospitalization.   Please advise if CMN can be completed.   Talbot Grumbling, RN

## 2020-06-25 NOTE — Telephone Encounter (Signed)
Call placed to patient. LCSW left message requesting a return call.  

## 2020-06-25 NOTE — Telephone Encounter (Signed)
Yes can sign fax when available.  Dorris Singh, MD  Family Medicine Teaching Service

## 2020-06-26 DIAGNOSIS — N186 End stage renal disease: Secondary | ICD-10-CM | POA: Diagnosis not present

## 2020-06-26 DIAGNOSIS — N2581 Secondary hyperparathyroidism of renal origin: Secondary | ICD-10-CM | POA: Diagnosis not present

## 2020-06-26 DIAGNOSIS — Z992 Dependence on renal dialysis: Secondary | ICD-10-CM | POA: Diagnosis not present

## 2020-06-29 DIAGNOSIS — U071 COVID-19: Secondary | ICD-10-CM | POA: Diagnosis not present

## 2020-06-29 DIAGNOSIS — N186 End stage renal disease: Secondary | ICD-10-CM | POA: Diagnosis not present

## 2020-06-29 DIAGNOSIS — N2581 Secondary hyperparathyroidism of renal origin: Secondary | ICD-10-CM | POA: Diagnosis not present

## 2020-06-29 DIAGNOSIS — J9621 Acute and chronic respiratory failure with hypoxia: Secondary | ICD-10-CM | POA: Diagnosis not present

## 2020-06-29 DIAGNOSIS — Z992 Dependence on renal dialysis: Secondary | ICD-10-CM | POA: Diagnosis not present

## 2020-06-30 DIAGNOSIS — I120 Hypertensive chronic kidney disease with stage 5 chronic kidney disease or end stage renal disease: Secondary | ICD-10-CM | POA: Diagnosis not present

## 2020-06-30 DIAGNOSIS — E1142 Type 2 diabetes mellitus with diabetic polyneuropathy: Secondary | ICD-10-CM | POA: Diagnosis not present

## 2020-06-30 DIAGNOSIS — M1990 Unspecified osteoarthritis, unspecified site: Secondary | ICD-10-CM | POA: Diagnosis not present

## 2020-06-30 DIAGNOSIS — J1282 Pneumonia due to coronavirus disease 2019: Secondary | ICD-10-CM | POA: Diagnosis not present

## 2020-06-30 DIAGNOSIS — U071 COVID-19: Secondary | ICD-10-CM | POA: Diagnosis not present

## 2020-06-30 DIAGNOSIS — N185 Chronic kidney disease, stage 5: Secondary | ICD-10-CM | POA: Diagnosis not present

## 2020-06-30 DIAGNOSIS — D631 Anemia in chronic kidney disease: Secondary | ICD-10-CM | POA: Diagnosis not present

## 2020-06-30 DIAGNOSIS — I5032 Chronic diastolic (congestive) heart failure: Secondary | ICD-10-CM | POA: Diagnosis not present

## 2020-06-30 DIAGNOSIS — E1122 Type 2 diabetes mellitus with diabetic chronic kidney disease: Secondary | ICD-10-CM | POA: Diagnosis not present

## 2020-07-01 DIAGNOSIS — N186 End stage renal disease: Secondary | ICD-10-CM | POA: Diagnosis not present

## 2020-07-01 DIAGNOSIS — N2581 Secondary hyperparathyroidism of renal origin: Secondary | ICD-10-CM | POA: Diagnosis not present

## 2020-07-01 DIAGNOSIS — Z992 Dependence on renal dialysis: Secondary | ICD-10-CM | POA: Diagnosis not present

## 2020-07-02 ENCOUNTER — Ambulatory Visit: Payer: Medicare PPO | Attending: Family Medicine | Admitting: Licensed Clinical Social Worker

## 2020-07-02 ENCOUNTER — Other Ambulatory Visit: Payer: Self-pay

## 2020-07-02 DIAGNOSIS — E1129 Type 2 diabetes mellitus with other diabetic kidney complication: Secondary | ICD-10-CM | POA: Diagnosis not present

## 2020-07-02 DIAGNOSIS — F4323 Adjustment disorder with mixed anxiety and depressed mood: Secondary | ICD-10-CM

## 2020-07-02 DIAGNOSIS — N186 End stage renal disease: Secondary | ICD-10-CM | POA: Diagnosis not present

## 2020-07-02 DIAGNOSIS — Z992 Dependence on renal dialysis: Secondary | ICD-10-CM | POA: Diagnosis not present

## 2020-07-03 DIAGNOSIS — N186 End stage renal disease: Secondary | ICD-10-CM | POA: Diagnosis not present

## 2020-07-03 DIAGNOSIS — Z992 Dependence on renal dialysis: Secondary | ICD-10-CM | POA: Diagnosis not present

## 2020-07-03 DIAGNOSIS — N2581 Secondary hyperparathyroidism of renal origin: Secondary | ICD-10-CM | POA: Diagnosis not present

## 2020-07-06 DIAGNOSIS — N2581 Secondary hyperparathyroidism of renal origin: Secondary | ICD-10-CM | POA: Diagnosis not present

## 2020-07-06 DIAGNOSIS — N186 End stage renal disease: Secondary | ICD-10-CM | POA: Diagnosis not present

## 2020-07-06 DIAGNOSIS — Z992 Dependence on renal dialysis: Secondary | ICD-10-CM | POA: Diagnosis not present

## 2020-07-07 DIAGNOSIS — U071 COVID-19: Secondary | ICD-10-CM | POA: Diagnosis not present

## 2020-07-07 DIAGNOSIS — N185 Chronic kidney disease, stage 5: Secondary | ICD-10-CM | POA: Diagnosis not present

## 2020-07-07 DIAGNOSIS — I5032 Chronic diastolic (congestive) heart failure: Secondary | ICD-10-CM | POA: Diagnosis not present

## 2020-07-07 DIAGNOSIS — D631 Anemia in chronic kidney disease: Secondary | ICD-10-CM | POA: Diagnosis not present

## 2020-07-07 DIAGNOSIS — M1990 Unspecified osteoarthritis, unspecified site: Secondary | ICD-10-CM | POA: Diagnosis not present

## 2020-07-07 DIAGNOSIS — E1142 Type 2 diabetes mellitus with diabetic polyneuropathy: Secondary | ICD-10-CM | POA: Diagnosis not present

## 2020-07-07 DIAGNOSIS — I120 Hypertensive chronic kidney disease with stage 5 chronic kidney disease or end stage renal disease: Secondary | ICD-10-CM | POA: Diagnosis not present

## 2020-07-07 DIAGNOSIS — E1122 Type 2 diabetes mellitus with diabetic chronic kidney disease: Secondary | ICD-10-CM | POA: Diagnosis not present

## 2020-07-07 DIAGNOSIS — J1282 Pneumonia due to coronavirus disease 2019: Secondary | ICD-10-CM | POA: Diagnosis not present

## 2020-07-08 DIAGNOSIS — N2581 Secondary hyperparathyroidism of renal origin: Secondary | ICD-10-CM | POA: Diagnosis not present

## 2020-07-08 DIAGNOSIS — N186 End stage renal disease: Secondary | ICD-10-CM | POA: Diagnosis not present

## 2020-07-08 DIAGNOSIS — Z992 Dependence on renal dialysis: Secondary | ICD-10-CM | POA: Diagnosis not present

## 2020-07-09 DIAGNOSIS — E1122 Type 2 diabetes mellitus with diabetic chronic kidney disease: Secondary | ICD-10-CM | POA: Diagnosis not present

## 2020-07-09 DIAGNOSIS — D631 Anemia in chronic kidney disease: Secondary | ICD-10-CM | POA: Diagnosis not present

## 2020-07-09 DIAGNOSIS — U071 COVID-19: Secondary | ICD-10-CM | POA: Diagnosis not present

## 2020-07-09 DIAGNOSIS — N185 Chronic kidney disease, stage 5: Secondary | ICD-10-CM | POA: Diagnosis not present

## 2020-07-09 DIAGNOSIS — I5032 Chronic diastolic (congestive) heart failure: Secondary | ICD-10-CM | POA: Diagnosis not present

## 2020-07-09 DIAGNOSIS — J1282 Pneumonia due to coronavirus disease 2019: Secondary | ICD-10-CM | POA: Diagnosis not present

## 2020-07-09 DIAGNOSIS — M1990 Unspecified osteoarthritis, unspecified site: Secondary | ICD-10-CM | POA: Diagnosis not present

## 2020-07-09 DIAGNOSIS — I120 Hypertensive chronic kidney disease with stage 5 chronic kidney disease or end stage renal disease: Secondary | ICD-10-CM | POA: Diagnosis not present

## 2020-07-09 DIAGNOSIS — E1142 Type 2 diabetes mellitus with diabetic polyneuropathy: Secondary | ICD-10-CM | POA: Diagnosis not present

## 2020-07-10 ENCOUNTER — Telehealth: Payer: Self-pay | Admitting: *Deleted

## 2020-07-10 DIAGNOSIS — Z992 Dependence on renal dialysis: Secondary | ICD-10-CM | POA: Diagnosis not present

## 2020-07-10 DIAGNOSIS — N2581 Secondary hyperparathyroidism of renal origin: Secondary | ICD-10-CM | POA: Diagnosis not present

## 2020-07-10 DIAGNOSIS — N186 End stage renal disease: Secondary | ICD-10-CM | POA: Diagnosis not present

## 2020-07-10 NOTE — Telephone Encounter (Signed)
Copied from [place]. Topic: General - Other >> Jul 09, 2020  3:07 PM Yvette Rack wrote: Reason for CRM: Lysbeth Galas with [facility] called to report pt complains of 10 out of 10 pain in left ankle and pt has a history of gout. Lysbeth Galas requests call back. Lysbeth Galas also stated she will discuss getting a mobile x-ray when the call is returned. Cb# [phone]

## 2020-07-10 NOTE — Telephone Encounter (Signed)
Attempt to call patient at the contacts listed: [phone] and [phone].  LVM to return call on patient voicemail. Patient needs an OV. Last OV was April 2021.   Unable to leave a voicemail on 2nd contact. VM is full. Will route to provider to see if the verbal order can be taken place for a mobile XR in the home.

## 2020-07-13 ENCOUNTER — Ambulatory Visit: Payer: Medicare PPO | Admitting: Family Medicine

## 2020-07-13 DIAGNOSIS — N186 End stage renal disease: Secondary | ICD-10-CM | POA: Diagnosis not present

## 2020-07-13 DIAGNOSIS — Z992 Dependence on renal dialysis: Secondary | ICD-10-CM | POA: Diagnosis not present

## 2020-07-13 DIAGNOSIS — N2581 Secondary hyperparathyroidism of renal origin: Secondary | ICD-10-CM | POA: Diagnosis not present

## 2020-07-15 DIAGNOSIS — Z1159 Encounter for screening for other viral diseases: Secondary | ICD-10-CM | POA: Diagnosis not present

## 2020-07-15 DIAGNOSIS — Z992 Dependence on renal dialysis: Secondary | ICD-10-CM | POA: Diagnosis not present

## 2020-07-15 DIAGNOSIS — N186 End stage renal disease: Secondary | ICD-10-CM | POA: Diagnosis not present

## 2020-07-15 DIAGNOSIS — N2581 Secondary hyperparathyroidism of renal origin: Secondary | ICD-10-CM | POA: Diagnosis not present

## 2020-07-15 DIAGNOSIS — Z20828 Contact with and (suspected) exposure to other viral communicable diseases: Secondary | ICD-10-CM | POA: Diagnosis not present

## 2020-07-16 DIAGNOSIS — Z992 Dependence on renal dialysis: Secondary | ICD-10-CM | POA: Diagnosis not present

## 2020-07-16 DIAGNOSIS — N185 Chronic kidney disease, stage 5: Secondary | ICD-10-CM | POA: Diagnosis not present

## 2020-07-16 DIAGNOSIS — E1122 Type 2 diabetes mellitus with diabetic chronic kidney disease: Secondary | ICD-10-CM | POA: Diagnosis not present

## 2020-07-16 DIAGNOSIS — D631 Anemia in chronic kidney disease: Secondary | ICD-10-CM | POA: Diagnosis not present

## 2020-07-16 DIAGNOSIS — N186 End stage renal disease: Secondary | ICD-10-CM | POA: Diagnosis not present

## 2020-07-16 DIAGNOSIS — E1142 Type 2 diabetes mellitus with diabetic polyneuropathy: Secondary | ICD-10-CM | POA: Diagnosis not present

## 2020-07-16 DIAGNOSIS — J1282 Pneumonia due to coronavirus disease 2019: Secondary | ICD-10-CM | POA: Diagnosis not present

## 2020-07-16 DIAGNOSIS — I120 Hypertensive chronic kidney disease with stage 5 chronic kidney disease or end stage renal disease: Secondary | ICD-10-CM | POA: Diagnosis not present

## 2020-07-16 DIAGNOSIS — M1990 Unspecified osteoarthritis, unspecified site: Secondary | ICD-10-CM | POA: Diagnosis not present

## 2020-07-16 DIAGNOSIS — T82898A Other specified complication of vascular prosthetic devices, implants and grafts, initial encounter: Secondary | ICD-10-CM | POA: Diagnosis not present

## 2020-07-16 DIAGNOSIS — I5032 Chronic diastolic (congestive) heart failure: Secondary | ICD-10-CM | POA: Diagnosis not present

## 2020-07-16 DIAGNOSIS — U071 COVID-19: Secondary | ICD-10-CM | POA: Diagnosis not present

## 2020-07-17 ENCOUNTER — Other Ambulatory Visit: Payer: Self-pay

## 2020-07-17 ENCOUNTER — Encounter: Payer: Self-pay | Admitting: Nurse Practitioner

## 2020-07-17 ENCOUNTER — Ambulatory Visit: Payer: Medicare PPO | Attending: Nurse Practitioner | Admitting: Nurse Practitioner

## 2020-07-17 ENCOUNTER — Telehealth: Payer: Self-pay | Admitting: Family Medicine

## 2020-07-17 VITALS — BP 119/75 | HR 77 | Temp 97.7°F | Ht 72.0 in | Wt 216.0 lb

## 2020-07-17 DIAGNOSIS — Z992 Dependence on renal dialysis: Secondary | ICD-10-CM | POA: Diagnosis not present

## 2020-07-17 DIAGNOSIS — M79672 Pain in left foot: Secondary | ICD-10-CM

## 2020-07-17 DIAGNOSIS — E1122 Type 2 diabetes mellitus with diabetic chronic kidney disease: Secondary | ICD-10-CM

## 2020-07-17 DIAGNOSIS — L853 Xerosis cutis: Secondary | ICD-10-CM

## 2020-07-17 DIAGNOSIS — N186 End stage renal disease: Secondary | ICD-10-CM | POA: Diagnosis not present

## 2020-07-17 DIAGNOSIS — N2581 Secondary hyperparathyroidism of renal origin: Secondary | ICD-10-CM | POA: Diagnosis not present

## 2020-07-17 DIAGNOSIS — N184 Chronic kidney disease, stage 4 (severe): Secondary | ICD-10-CM | POA: Diagnosis not present

## 2020-07-17 LAB — GLUCOSE, POCT (MANUAL RESULT ENTRY): POC Glucose: 169 mg/dl — AB (ref 70–99)

## 2020-07-17 MED ORDER — EUCERIN ADVANCED REPAIR EX CREA: TOPICAL_CREAM | CUTANEOUS | 0 refills | Status: DC

## 2020-07-17 MED ORDER — GABAPENTIN 300 MG PO CAPS: 300.0000 mg | ORAL_CAPSULE | Freq: Every day | ORAL | 0 refills | Status: DC

## 2020-07-17 MED ORDER — DICLOFENAC SODIUM 1 % EX GEL: 4.0000 g | Freq: Four times a day (QID) | CUTANEOUS | 1 refills | Status: AC

## 2020-07-17 NOTE — Progress Notes (Signed)
Assessment & Plan:  Edward [name] was seen today for foot pain.  Diagnoses and all orders for this visit:  Left foot pain -     gabapentin (NEURONTIN) 300 MG capsule; Take 1 capsule (300 mg total) by mouth at bedtime. -     diclofenac Sodium (VOLTAREN) 1 % GEL; Apply 4 g topically 4 (four) times daily.  Type 2 diabetes mellitus with stage 4 chronic kidney disease, without long-term current use of insulin (HCC) -     Glucose (CBG) -     Ambulatory referral to Podiatry Continue blood sugar control as discussed in office today, low carbohydrate diet, and regular physical exercise as tolerated, 150 minutes per week (30 min each day, 5 days per week, or 50 min 3 days per week). Keep blood sugar logs with fasting goal of 90-130 mg/dl, post prandial (after you eat) less than 180.  For Hypoglycemia: BS <60 and Hyperglycemia BS >400; contact the clinic ASAP. Annual eye exams and foot exams are recommended.   Dry skin dermatitis -     Emollient (EUCERIN ADVANCED REPAIR) CREA; Apply to dry skin as needed    Patient has been counseled on age-appropriate routine health concerns for screening and prevention. These are reviewed and up-to-date. Referrals have been placed accordingly. Immunizations are up-to-date or declined.    Subjective:   Chief Complaint  Patient presents with   Foot Pain    Pt. stated he is having left foot pain and blister on both leg.    HPI Edward [name]. 49 y.o. male presents to office today with complaints of left foot pain and bilateral blisters.  He has a history of DM, Chronic renal failure (on HD), Anemia, left eye blindness, Depression, Graves Dz, OA, CHF.   Foot Problem Patient complains of left foot pain and bilateral blisters on the medial aspects of both legs. The blisters have now healed on both legs. Today he is mostly concerned about his left medial foot and ankle pain. Pain is worse with walking. Described as aching, sharp pain. Denies any recent  injury or trauma. He has a history of chronic venous insufficiency and DM.  Blood pressure is well controlled. Denies chest pain, shortness of breath, palpitations, lightheadedness, dizziness, headaches or BLE edema.  BP Readings from Last 3 Encounters:  07/17/20 119/75  05/29/20 138/86  04/28/20 (!) 165/97   Lab Results  Component Value Date   HGBA1C 6.7 (H) 05/24/2020     Review of Systems  Constitutional: Negative for fever, malaise/fatigue and weight loss.  HENT: Negative.  Negative for nosebleeds.   Eyes: Negative.  Negative for blurred vision, double vision and photophobia.  Respiratory: Negative.  Negative for cough and shortness of breath.   Cardiovascular: Positive for leg swelling (left foot). Negative for chest pain and palpitations.  Gastrointestinal: Negative.  Negative for heartburn, nausea and vomiting.  Musculoskeletal: Negative.  Negative for myalgias.  Skin: Positive for itching.  Neurological: Negative.  Negative for dizziness, focal weakness, seizures and headaches.  Psychiatric/Behavioral: Negative.  Negative for suicidal ideas.    Past Medical History:  Diagnosis Date   Acute on chronic renal failure ([place]) 11/25/2015   Anemia    Arthritis    Blind    Left eye   Cataract    CHF (congestive heart failure) (HCC)    Chronic kidney disease    stage 5   Depression    situatuional   Diabetes mellitus    Type II   Graves disease  2014   Headache    in past   Hx of adenomatous and sessile serrated colonic polyps 11/21/2019   Hyperlipidemia    Hypertension    Hypothyroidism    Legally blind    right eye   Neuropathy    Non-compliance    Proteinuria 05/24/2020   Shortness of breath dyspnea    "Better since I've been taking my medications"    Past Surgical History:  Procedure Laterality Date   AV FISTULA PLACEMENT Right 04/22/2019   Procedure: RIGHT ARM ARTERIOVENOUS (AV) FISTULA CREATION;  Surgeon: Angelia Mould, MD;   Location: [facility];  Service: Vascular;  Laterality: Right;   CIRCUMCISION     as a child, around 57 years of age   EYE SURGERY Bilateral    "several "   PARS PLANA VITRECTOMY Right 03/25/2016   Procedure: PARS PLANA VITRECTOMY 25 GAUGE FOR ENDOPHTHALMITIS;  Surgeon: Jalene Mullet, MD;  Location: [facility];  Service: Ophthalmology;  Laterality: Right;   WISDOM TOOTH EXTRACTION      Family History  Problem Relation Age of Onset   Hypertension Mother    Diabetes Mother    Bladder Cancer Brother    Kidney disease Other    Colon cancer Neg Hx    Rectal cancer Neg Hx     Social History Reviewed with no changes to be made today.   Outpatient Medications Prior to Visit  Medication Sig Dispense Refill   acetaminophen (TYLENOL) 500 MG tablet Take 500 mg by mouth every 6 (six) hours as needed.     Alcohol Swabs (B-D SINGLE USE SWABS REGULAR) PADS Use as directed. 100 each 6   atorvastatin (LIPITOR) 40 MG tablet Take 1 tablet (40 mg total) by mouth daily. 30 tablet 6   Blood Glucose Monitoring Suppl (ACCU-CHEK GUIDE ME) w/Device KIT Use 3 times daily before meals 1 kit 0   glucose blood (ACCU-CHEK GUIDE) test strip Use as instructed 100 each 12   levothyroxine (SYNTHROID) 137 MCG tablet TAKE 1 TABLET(137 MCG) BY MOUTH DAILY BEFORE BREAKFAST (Patient taking differently: Take 137 mcg by mouth daily before breakfast. ) 90 tablet 1   sevelamer carbonate (RENVELA) 800 MG tablet Take 1 tablet (800 mg total) by mouth 3 (three) times daily with meals. 90 tablet 0   TRUEplus Lancets 33G MISC Use as instructed to check blood sugar daily. 100 each 6   diclofenac sodium (VOLTAREN) 1 % GEL Apply 2 g topically 3 (three) times daily as needed (arthritic pain).     No facility-administered medications prior to visit.    No Known Allergies     Objective:    BP 119/75 (BP Location: Left Arm, Patient Position: Sitting, Cuff Size: Normal)    Pulse 77    Temp 97.7 F (36.5 C) (Temporal)    Ht  6' (1.829 m)    Wt 216 lb (98 kg)    SpO2 100%    BMI 29.29 kg/m  Wt Readings from Last 3 Encounters:  07/17/20 216 lb (98 kg)  05/29/20 207 lb 14.3 oz (94.3 kg)  03/17/20 250 lb (113.4 kg)    Physical Exam Vitals and nursing note reviewed.  Constitutional:      Appearance: He is well-developed.  HENT:     Head: Normocephalic and atraumatic.  Cardiovascular:     Rate and Rhythm: Normal rate and regular rhythm.     Heart sounds: Normal heart sounds. No murmur heard.  No friction rub. No gallop.   Pulmonary:  Effort: Pulmonary effort is normal. No tachypnea or respiratory distress.     Breath sounds: Normal breath sounds. No decreased breath sounds, wheezing, rhonchi or rales.  Chest:     Chest wall: No tenderness.  Abdominal:     General: Bowel sounds are normal.     Palpations: Abdomen is soft.  Musculoskeletal:        General: Normal range of motion.     Cervical back: Normal range of motion.  Feet:     Right foot:     Skin integrity: Callus and dry skin present. No ulcer, blister, skin breakdown, erythema or warmth.     Toenail Condition: Right toenails are abnormally thick. Fungal disease present.    Left foot:     Skin integrity: Callus and dry skin present. No ulcer, blister, skin breakdown, erythema or warmth.     Toenail Condition: Left toenails are abnormally thick. Fungal disease present. Skin:    General: Skin is warm and dry.     Findings: No abrasion, erythema, signs of injury or rash.     Comments: Very dry, flaking and peeling skin on bilateral feet and left hand  Neurological:     Mental Status: He is alert and oriented to person, place, and time.     Coordination: Coordination normal.  Psychiatric:        Behavior: Behavior normal. Behavior is cooperative.        Thought Content: Thought content normal.        Judgment: Judgment normal.          Patient has been counseled extensively about nutrition and exercise as well as the importance of  adherence with medications and regular follow-up. The patient was given clear instructions to go to ER or return to medical center if symptoms don't improve, worsen or new problems develop. The patient verbalized understanding.   Follow-up: Return in about 8 weeks (around 09/11/2020) for WITH DR [place].   Gildardo Pounds, FNP-BC [facility], [place]   07/17/2020, 1:36 PM

## 2020-07-17 NOTE — Telephone Encounter (Signed)
Copied from [place] [id number]. Topic: General - Other >> Jul 16, 2020  2:16 PM Lennox Solders wrote: Reason for CRM: wanda with [employer] will fax over [place] for oxygen

## 2020-07-17 NOTE — Telephone Encounter (Signed)
Noted will look out for information.

## 2020-07-20 DIAGNOSIS — Z992 Dependence on renal dialysis: Secondary | ICD-10-CM | POA: Diagnosis not present

## 2020-07-20 DIAGNOSIS — N2581 Secondary hyperparathyroidism of renal origin: Secondary | ICD-10-CM | POA: Diagnosis not present

## 2020-07-20 DIAGNOSIS — N186 End stage renal disease: Secondary | ICD-10-CM | POA: Diagnosis not present

## 2020-07-22 DIAGNOSIS — Z992 Dependence on renal dialysis: Secondary | ICD-10-CM | POA: Diagnosis not present

## 2020-07-22 DIAGNOSIS — N186 End stage renal disease: Secondary | ICD-10-CM | POA: Diagnosis not present

## 2020-07-22 DIAGNOSIS — N2581 Secondary hyperparathyroidism of renal origin: Secondary | ICD-10-CM | POA: Diagnosis not present

## 2020-07-24 DIAGNOSIS — Z992 Dependence on renal dialysis: Secondary | ICD-10-CM | POA: Diagnosis not present

## 2020-07-24 DIAGNOSIS — N2581 Secondary hyperparathyroidism of renal origin: Secondary | ICD-10-CM | POA: Diagnosis not present

## 2020-07-24 DIAGNOSIS — N186 End stage renal disease: Secondary | ICD-10-CM | POA: Diagnosis not present

## 2020-07-24 NOTE — BH Specialist Note (Signed)
Integrated Behavioral Health Initial Visit  MRN: [id number] Name: Edward [name]  Number of [facility] Clinician visits:: 1/6 Session Start time: 9:10 AM  Session End time: 9:45 AM Total time: 35   Type of Service: [place] Interpretor:No. Interpretor Name and Language: NA   SUBJECTIVE: Edward MCCLAREN Sr. is a 49 y.o. male accompanied by self Patient was referred by PCP for depression and anxiety. Patient reports the following symptoms/concerns: Pt reports symptoms of depression and anxiety triggered by chronic medical conditions. States that it has been an adjustment after being diagnosed with COVID19 resulting in hospitalization Duration of problem: few weeks; Severity of problem: moderate  OBJECTIVE: Mood: Anxious and Pleasant and Affect: Appropriate Risk of harm to self or others: No plan to harm self or others  LIFE CONTEXT: Family and Social: Pt receives support from siblings and children School/Work: Pt receives disability Self-Care: Pt reports that he has spoken to supportive staff at dialysis center. He has been trying to talk to loved ones regarding health Life Changes: Pt has difficulty coping with ongoing mental and physical health conditions  GOALS ADDRESSED: Patient will: 1. Increase knowledge and/or ability of: coping skills Pt was successful in identifying healthy coping skills to assist in management and/or decrease symptoms 2. Demonstrate ability to: Increase healthy adjustment to current life circumstances and Increase adequate support systems for patient/family Pt agreed to utilize support systems through loved ones or clinics to strengthen support system. Pt agreed to utilize supportive resources to assist with utility/rent assistance  INTERVENTIONS: Interventions utilized: Solution-Focused Strategies, Supportive Counseling, Psychoeducation and/or Health Education and Link to [employer]   Standardized Assessments completed: Patient declined screening  ASSESSMENT: Patient currently experiencing difficulty managing mental and physical health conditions.   Patient may benefit from strengthening support system and utilizing supportive resources.  PLAN: 1. Follow up with behavioral health clinician on : Contact LCSW with any additional behavioral health and/or resources 2. Behavioral recommendations: Utilize resources and healthy coping skills discussed 3. Referral(s): [place] (In Clinic) and [employer] Resources:  Finances 4. "From scale of 1-10, how likely are you to follow plan?":   [name], LCSW 07/24/2020 2:07 PM

## 2020-07-27 DIAGNOSIS — N186 End stage renal disease: Secondary | ICD-10-CM | POA: Diagnosis not present

## 2020-07-27 DIAGNOSIS — Z992 Dependence on renal dialysis: Secondary | ICD-10-CM | POA: Diagnosis not present

## 2020-07-27 DIAGNOSIS — N2581 Secondary hyperparathyroidism of renal origin: Secondary | ICD-10-CM | POA: Diagnosis not present

## 2020-07-28 DIAGNOSIS — I120 Hypertensive chronic kidney disease with stage 5 chronic kidney disease or end stage renal disease: Secondary | ICD-10-CM | POA: Diagnosis not present

## 2020-07-28 DIAGNOSIS — D631 Anemia in chronic kidney disease: Secondary | ICD-10-CM | POA: Diagnosis not present

## 2020-07-28 DIAGNOSIS — J1282 Pneumonia due to coronavirus disease 2019: Secondary | ICD-10-CM | POA: Diagnosis not present

## 2020-07-28 DIAGNOSIS — M1990 Unspecified osteoarthritis, unspecified site: Secondary | ICD-10-CM | POA: Diagnosis not present

## 2020-07-28 DIAGNOSIS — E1142 Type 2 diabetes mellitus with diabetic polyneuropathy: Secondary | ICD-10-CM | POA: Diagnosis not present

## 2020-07-28 DIAGNOSIS — N185 Chronic kidney disease, stage 5: Secondary | ICD-10-CM | POA: Diagnosis not present

## 2020-07-28 DIAGNOSIS — U071 COVID-19: Secondary | ICD-10-CM | POA: Diagnosis not present

## 2020-07-28 DIAGNOSIS — I5032 Chronic diastolic (congestive) heart failure: Secondary | ICD-10-CM | POA: Diagnosis not present

## 2020-07-28 DIAGNOSIS — E1122 Type 2 diabetes mellitus with diabetic chronic kidney disease: Secondary | ICD-10-CM | POA: Diagnosis not present

## 2020-07-29 DIAGNOSIS — Z992 Dependence on renal dialysis: Secondary | ICD-10-CM | POA: Diagnosis not present

## 2020-07-29 DIAGNOSIS — N2581 Secondary hyperparathyroidism of renal origin: Secondary | ICD-10-CM | POA: Diagnosis not present

## 2020-07-29 DIAGNOSIS — N186 End stage renal disease: Secondary | ICD-10-CM | POA: Diagnosis not present

## 2020-07-29 DIAGNOSIS — U071 COVID-19: Secondary | ICD-10-CM | POA: Diagnosis not present

## 2020-07-29 DIAGNOSIS — J9621 Acute and chronic respiratory failure with hypoxia: Secondary | ICD-10-CM | POA: Diagnosis not present

## 2020-07-30 DIAGNOSIS — U071 COVID-19: Secondary | ICD-10-CM | POA: Diagnosis not present

## 2020-07-30 DIAGNOSIS — I5032 Chronic diastolic (congestive) heart failure: Secondary | ICD-10-CM | POA: Diagnosis not present

## 2020-07-30 DIAGNOSIS — E1122 Type 2 diabetes mellitus with diabetic chronic kidney disease: Secondary | ICD-10-CM | POA: Diagnosis not present

## 2020-07-30 DIAGNOSIS — N185 Chronic kidney disease, stage 5: Secondary | ICD-10-CM | POA: Diagnosis not present

## 2020-07-30 DIAGNOSIS — J1282 Pneumonia due to coronavirus disease 2019: Secondary | ICD-10-CM | POA: Diagnosis not present

## 2020-07-30 DIAGNOSIS — M1990 Unspecified osteoarthritis, unspecified site: Secondary | ICD-10-CM | POA: Diagnosis not present

## 2020-07-30 DIAGNOSIS — D631 Anemia in chronic kidney disease: Secondary | ICD-10-CM | POA: Diagnosis not present

## 2020-07-30 DIAGNOSIS — I120 Hypertensive chronic kidney disease with stage 5 chronic kidney disease or end stage renal disease: Secondary | ICD-10-CM | POA: Diagnosis not present

## 2020-07-30 DIAGNOSIS — E1142 Type 2 diabetes mellitus with diabetic polyneuropathy: Secondary | ICD-10-CM | POA: Diagnosis not present

## 2020-07-31 DIAGNOSIS — Z992 Dependence on renal dialysis: Secondary | ICD-10-CM | POA: Diagnosis not present

## 2020-07-31 DIAGNOSIS — N186 End stage renal disease: Secondary | ICD-10-CM | POA: Diagnosis not present

## 2020-07-31 DIAGNOSIS — N2581 Secondary hyperparathyroidism of renal origin: Secondary | ICD-10-CM | POA: Diagnosis not present

## 2020-08-02 DIAGNOSIS — E1129 Type 2 diabetes mellitus with other diabetic kidney complication: Secondary | ICD-10-CM | POA: Diagnosis not present

## 2020-08-02 DIAGNOSIS — N186 End stage renal disease: Secondary | ICD-10-CM | POA: Diagnosis not present

## 2020-08-02 DIAGNOSIS — Z992 Dependence on renal dialysis: Secondary | ICD-10-CM | POA: Diagnosis not present

## 2020-08-03 DIAGNOSIS — N186 End stage renal disease: Secondary | ICD-10-CM | POA: Diagnosis not present

## 2020-08-03 DIAGNOSIS — N2581 Secondary hyperparathyroidism of renal origin: Secondary | ICD-10-CM | POA: Diagnosis not present

## 2020-08-03 DIAGNOSIS — Z992 Dependence on renal dialysis: Secondary | ICD-10-CM | POA: Diagnosis not present

## 2020-08-05 DIAGNOSIS — N2581 Secondary hyperparathyroidism of renal origin: Secondary | ICD-10-CM | POA: Diagnosis not present

## 2020-08-05 DIAGNOSIS — Z992 Dependence on renal dialysis: Secondary | ICD-10-CM | POA: Diagnosis not present

## 2020-08-05 DIAGNOSIS — N186 End stage renal disease: Secondary | ICD-10-CM | POA: Diagnosis not present

## 2020-08-07 DIAGNOSIS — N2581 Secondary hyperparathyroidism of renal origin: Secondary | ICD-10-CM | POA: Diagnosis not present

## 2020-08-07 DIAGNOSIS — N186 End stage renal disease: Secondary | ICD-10-CM | POA: Diagnosis not present

## 2020-08-07 DIAGNOSIS — Z992 Dependence on renal dialysis: Secondary | ICD-10-CM | POA: Diagnosis not present

## 2020-08-10 DIAGNOSIS — Z992 Dependence on renal dialysis: Secondary | ICD-10-CM | POA: Diagnosis not present

## 2020-08-10 DIAGNOSIS — N2581 Secondary hyperparathyroidism of renal origin: Secondary | ICD-10-CM | POA: Diagnosis not present

## 2020-08-10 DIAGNOSIS — N186 End stage renal disease: Secondary | ICD-10-CM | POA: Diagnosis not present

## 2020-08-11 ENCOUNTER — Encounter: Payer: Self-pay | Admitting: Podiatry

## 2020-08-11 ENCOUNTER — Other Ambulatory Visit: Payer: Self-pay

## 2020-08-11 ENCOUNTER — Ambulatory Visit (INDEPENDENT_AMBULATORY_CARE_PROVIDER_SITE_OTHER): Payer: Medicare PPO | Admitting: Podiatry

## 2020-08-11 DIAGNOSIS — Z992 Dependence on renal dialysis: Secondary | ICD-10-CM | POA: Diagnosis not present

## 2020-08-11 DIAGNOSIS — N186 End stage renal disease: Secondary | ICD-10-CM

## 2020-08-11 DIAGNOSIS — B351 Tinea unguium: Secondary | ICD-10-CM

## 2020-08-11 DIAGNOSIS — M79675 Pain in left toe(s): Secondary | ICD-10-CM

## 2020-08-11 DIAGNOSIS — B353 Tinea pedis: Secondary | ICD-10-CM | POA: Diagnosis not present

## 2020-08-11 DIAGNOSIS — E1142 Type 2 diabetes mellitus with diabetic polyneuropathy: Secondary | ICD-10-CM | POA: Diagnosis not present

## 2020-08-11 DIAGNOSIS — M79674 Pain in right toe(s): Secondary | ICD-10-CM | POA: Diagnosis not present

## 2020-08-11 MED ORDER — KETOCONAZOLE 2 % EX CREA: 1.0000 "application " | TOPICAL_CREAM | Freq: Every day | CUTANEOUS | 2 refills | Status: DC

## 2020-08-11 NOTE — Progress Notes (Signed)
  Subjective:  Patient ID: Edward [name], male    DOB: 10-Dec-1970,  MRN: [id number]  Chief Complaint  Patient presents with  . diabetic foot exam    nail trim     49 y.o. male presents with the above complaint. History confirmed with patient. States his A1c is good (last check 05/24/20 was 6.7%), they check his BS often at dialysis. C/O thickened elongated toenails, dry scaly skin, and pain along inside of ankle with pins/needles in feet.  Objective:  Physical Exam: warm, good capillary refill, no trophic changes or ulcerative lesions, normal DP and PT pulses and onychomycosis. Absent protective sensation, + Tinel's bilaterally, L > R. Paresthesias noted. Dry scaly skin on plantar feet bilaterally  Assessment:   1. ESRD (end stage renal disease) on dialysis ([place])   2. Type 2 diabetes mellitus with polyneuropathy (HCC)   3. Onychomycosis   4. Pain due to onychomycosis of toenails of both feet   5. Tinea pedis of both feet      Plan:  Patient was evaluated and treated and all questions answered.  Patient educated on diabetes. Discussed proper diabetic foot care and discussed risks and complications of disease. Educated patient in depth on reasons to return to the office immediately should he/she discover anything concerning or new on the feet. All questions answered. Discussed proper shoes as well.   I discussed with him that his pain in the ankles and feet are likely 2/2 to diabetic peripheral neuropathy as well as radiculopathy, he says he has multiple herniated discs. Advised to d/w PCP regarding medical therapy such as with gabapentin  Discussed the etiology and treatment options for tinea pedis.  Discussed topical and oral treatment.  Recommended topical treatment with 2% ketoconazole cream.  This was sent to the patient's pharmacy.  Also discussed appropriate foot hygiene, use of antifungal spray such as Tinactin in shoes, as well as cleaning foot surfaces such as showers  and bathroom floors with bleach.  Discussed the etiology and treatment options for the condition in detail with the patient. Educated patient on the topical and oral treatment options for mycotic nails. Recommended debridement of the nails today. Sharp and mechanical debridement performed of all painful and mycotic nails today. Nails debrided in length and thickness using a nail nipper and a mechanical burr to level of comfort. Discussed treatment options including appropriate shoe gear. Follow up as needed for painful nails.   Return in about 3 months (around 11/11/2020).

## 2020-08-12 DIAGNOSIS — Z992 Dependence on renal dialysis: Secondary | ICD-10-CM | POA: Diagnosis not present

## 2020-08-12 DIAGNOSIS — N186 End stage renal disease: Secondary | ICD-10-CM | POA: Diagnosis not present

## 2020-08-12 DIAGNOSIS — N2581 Secondary hyperparathyroidism of renal origin: Secondary | ICD-10-CM | POA: Diagnosis not present

## 2020-08-14 DIAGNOSIS — Z992 Dependence on renal dialysis: Secondary | ICD-10-CM | POA: Diagnosis not present

## 2020-08-14 DIAGNOSIS — N2581 Secondary hyperparathyroidism of renal origin: Secondary | ICD-10-CM | POA: Diagnosis not present

## 2020-08-14 DIAGNOSIS — N186 End stage renal disease: Secondary | ICD-10-CM | POA: Diagnosis not present

## 2020-08-17 DIAGNOSIS — Z992 Dependence on renal dialysis: Secondary | ICD-10-CM | POA: Diagnosis not present

## 2020-08-17 DIAGNOSIS — N2581 Secondary hyperparathyroidism of renal origin: Secondary | ICD-10-CM | POA: Diagnosis not present

## 2020-08-17 DIAGNOSIS — N186 End stage renal disease: Secondary | ICD-10-CM | POA: Diagnosis not present

## 2020-08-19 DIAGNOSIS — N2581 Secondary hyperparathyroidism of renal origin: Secondary | ICD-10-CM | POA: Diagnosis not present

## 2020-08-19 DIAGNOSIS — N186 End stage renal disease: Secondary | ICD-10-CM | POA: Diagnosis not present

## 2020-08-19 DIAGNOSIS — Z992 Dependence on renal dialysis: Secondary | ICD-10-CM | POA: Diagnosis not present

## 2020-08-21 ENCOUNTER — Other Ambulatory Visit: Payer: Self-pay | Admitting: Internal Medicine

## 2020-08-21 ENCOUNTER — Encounter: Payer: Self-pay | Admitting: Internal Medicine

## 2020-08-21 ENCOUNTER — Ambulatory Visit (INDEPENDENT_AMBULATORY_CARE_PROVIDER_SITE_OTHER): Payer: Medicare PPO | Admitting: Internal Medicine

## 2020-08-21 ENCOUNTER — Other Ambulatory Visit: Payer: Self-pay

## 2020-08-21 VITALS — BP 140/80 | HR 77 | Ht 72.0 in | Wt 215.6 lb

## 2020-08-21 DIAGNOSIS — E1122 Type 2 diabetes mellitus with diabetic chronic kidney disease: Secondary | ICD-10-CM

## 2020-08-21 DIAGNOSIS — Z8639 Personal history of other endocrine, nutritional and metabolic disease: Secondary | ICD-10-CM

## 2020-08-21 DIAGNOSIS — N2581 Secondary hyperparathyroidism of renal origin: Secondary | ICD-10-CM | POA: Diagnosis not present

## 2020-08-21 DIAGNOSIS — N186 End stage renal disease: Secondary | ICD-10-CM | POA: Diagnosis not present

## 2020-08-21 DIAGNOSIS — Z992 Dependence on renal dialysis: Secondary | ICD-10-CM | POA: Diagnosis not present

## 2020-08-21 DIAGNOSIS — N184 Chronic kidney disease, stage 4 (severe): Secondary | ICD-10-CM | POA: Diagnosis not present

## 2020-08-21 DIAGNOSIS — E890 Postprocedural hypothyroidism: Secondary | ICD-10-CM

## 2020-08-21 LAB — POCT GLYCOSYLATED HEMOGLOBIN (HGB A1C): Hemoglobin A1C: 7 % — AB (ref 4.0–5.6)

## 2020-08-21 LAB — TSH: TSH: 138.59 u[IU]/mL — ABNORMAL HIGH (ref 0.35–4.50)

## 2020-08-21 LAB — T4, FREE: Free T4: 0.14 ng/dL — ABNORMAL LOW (ref 0.60–1.60)

## 2020-08-21 MED ORDER — ACCU-CHEK SOFTCLIX LANCETS MISC: 12 refills | Status: DC

## 2020-08-21 MED ORDER — ACCU-CHEK GUIDE ME W/DEVICE KIT: PACK | 0 refills | Status: DC

## 2020-08-21 MED ORDER — ACCU-CHEK GUIDE VI STRP: ORAL_STRIP | 12 refills | Status: DC

## 2020-08-21 MED ORDER — LEVOTHYROXINE SODIUM 137 MCG PO TABS
ORAL_TABLET | ORAL | 5 refills | Status: DC
Start: 2020-08-21 — End: 2021-12-02

## 2020-08-21 NOTE — Patient Instructions (Addendum)
Please continue: - Levothyroxine 137 mcg daily  Take the thyroid hormone every day, with water, at least 30 minutes before breakfast, separated by at least 4 hours from: - acid reflux medications - calcium - iron - multivitamins  Please continue: - Glipizide XL 2.5 mg before b'fast  Start checking sugars daily.  Please stop at the lab.  Please come back for a follow-up appointment in 3 months.

## 2020-08-21 NOTE — Progress Notes (Signed)
Patient ID: Edward [name], male   DOB: 1971-02-09, 49 y.o.   MRN: [id number]  This visit occurred during the SARS-CoV-2 public health emergency.  Safety protocols were in place, including screening questions prior to the visit, additional usage of staff PPE, and extensive cleaning of exam room while observing appropriate contact time as indicated for disinfecting solutions.   HPI  Edward LUDTKE Sr. is a 49 y.o.-year-old male, initially referred by his PCP, Dr. Feliciana Rossetti, returning for f/u for Graves ds, now with post ablative hypothyroidism.  He also has type 2 diabetes (with DR, end-stage renal disease, ED, chronic diastolic heart failure), which is uncontrolled.  Last visit 9 months ago.  He was admitted in 05/2020 due to COVID-19 and acute respiratory failure. He recovered well after that.  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  Since last visit, he had a Thyroid Uptake and Scan (05/24/2017): 1. Elevated 24 hour radioactive iodine uptake equal to 36.9%. 2. Heterogeneous tracer activity within both lobes of the thyroid gland suggesting multinodular goiter.  He had RAI Tx (06/23/2017).  Afterwards, he was noncompliant with his levothyroxine and his office visits and TSH remains elevated.  In the past he was taking levothyroxine at night along with calcium.  I advised him to move levothyroxine in the morning.  Pt is on levothyroxine 137 mcg daily, taken: - missing doses - several a week (3 days a week) - in am or right before HD - fasting - at least 30 min from b'fast - no calcium - no iron - no multivitamins - no PPIs - not on Biotin  Reviewed his TFTs: Lab Results  Component Value Date   TSH 35.088 (H) 05/23/2020   TSH 99.30 (H) 02/12/2020   TSH 72.05 (H) 12/20/2019   TSH 79.400 (H) 10/21/2019   TSH  >49.10 (H) 10/22/2018   TSH 86.600 (H) 08/20/2018   TSH <0.01 (L) 08/10/2017   TSH <0.01 (L) 05/12/2017   TSH <0.01 (L) 02/21/2017   TSH 0.04 (L) 09/20/2016   FREET4 0.19 (L) 02/12/2020   FREET4 0.44 (L) 12/20/2019   FREET4 0.41 (L) 10/21/2019   FREET4 0.26 (L) 10/22/2018   FREET4 2.92 (H) 08/10/2017   FREET4 1.69 (H) 05/12/2017   FREET4 1.48 02/21/2017   FREET4 1.50 09/20/2016   FREET4 1.24 05/31/2016   FREET4 1.14 04/11/2016    His TSI's were elevated: Lab Results  Component Value Date   TSI 463 (H) 02/26/2016   Pt denies: - feeling nodules in neck - hoarseness - dysphagia - choking - SOB with lying down  Pt does not have a FH of thyroid ds. No FH of thyroid cancer. No h/o radiation tx to head or neck.  No seaweed or kelp. No recent contrast studies. No herbal supplements. No Biotin use. No recent steroids use.   Latest HbA1c reviewed: Lab Results  Component Value Date   HGBA1C 6.7 (H) 05/24/2020   He is on: - Glipizide 2.5 mg before breakfast  He is not checking sugars.  He has end-stage renal disease - on HD at 11:30 am: Lab Results  Component Value Date   BUN 75 (H) 05/28/2020   BUN 102 (H) 05/27/2020   BUN 93 (H) 05/26/2020   BUN 96 (H) 05/25/2020   BUN 107 (H) 05/25/2020   BUN 97 (H) 05/24/2020  BUN 96 (H) 05/23/2020   BUN 60 (H) 08/13/2019   BUN 36 (H) 08/20/2018   BUN 32 (H) 03/21/2018   Lab Results  Component Value Date   CREATININE 5.30 (H) 05/28/2020   CREATININE 6.39 (H) 05/27/2020   CREATININE 6.94 (H) 05/26/2020   CREATININE 6.95 (H) 05/25/2020   CREATININE 8.10 (H) 05/25/2020   CREATININE 8.69 (H) 05/24/2020   CREATININE 9.05 (H) 05/23/2020   CREATININE 6.77 (H) 08/13/2019   CREATININE 4.01 (H) 08/20/2018   CREATININE 3.67 (H) 03/21/2018   Last eye exam was in 2021.  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  He also has a history of back pain,  osteoarthritis.  ROS: Constitutional: no weight gain/no weight loss, no fatigue, no subjective hyperthermia, no subjective hypothermia Eyes: no blurry vision, no xerophthalmia ENT: no sore throat, + see HPI Cardiovascular: no CP/no SOB/no palpitations/no leg swelling Respiratory: no cough/no SOB/no wheezing Gastrointestinal: no N/no V/no D/no C/no acid reflux Musculoskeletal: no muscle aches/no joint aches Skin: no rashes, no hair loss Neurological: no tremors/no numbness/no tingling/no dizziness  I reviewed pt's medications, allergies, PMH, social hx, family hx, and changes were documented in the history of present illness. Otherwise, unchanged from my initial visit note.  Past Medical History:  Diagnosis Date  . Acute on chronic renal failure ([place]) 11/25/2015  . Anemia   . Arthritis   . Blind    Left eye  . Cataract   . CHF (congestive heart failure) ([place])   . Chronic kidney disease    stage 5  . Depression    situatuional  . Diabetes mellitus    Type II  . Graves disease 2014  . Headache    in past  . Hx of adenomatous and sessile serrated colonic polyps 11/21/2019  . Hyperlipidemia   . Hypertension   . Hypothyroidism   . Legally blind    right eye  . Neuropathy   . Non-compliance   . Proteinuria 05/24/2020  . Shortness of breath dyspnea    "Better since I've been taking my medications"   Past Surgical History:  Procedure Laterality Date  . AV FISTULA PLACEMENT Right 04/22/2019   Procedure: RIGHT ARM ARTERIOVENOUS (AV) FISTULA CREATION;  Surgeon: Angelia Mould, MD;  Location: [facility];  Service: Vascular;  Laterality: Right;  . CIRCUMCISION     as a child, around 54 years of age  . EYE SURGERY Bilateral    "several "  . PARS PLANA VITRECTOMY Right 03/25/2016   Procedure: PARS PLANA VITRECTOMY 25 GAUGE FOR ENDOPHTHALMITIS;  Surgeon: Jalene Mullet, MD;  Location: [facility];  Service: Ophthalmology;  Laterality: Right;  . WISDOM TOOTH EXTRACTION     Social  History   Social History  . Marital Status: Married    Spouse Name: N/A  . Number of Children: 5: 25-4 y/o (2017)   Occupational History  . Chef-cook   Social History Main Topics  . Smoking status: Former Smoker -- 0.75 packs/day for 3 years    Types: Cigarettes    Quit date: 2004  . Smokeless tobacco: Not on file  . Alcohol Use:      Comment: 1 beer every night  . Drug Use: No   Current Outpatient Medications on File Prior to Visit  Medication Sig Dispense Refill  . acetaminophen (TYLENOL) 500 MG tablet Take 500 mg by mouth every 6 (six) hours as needed.    Marland Kitchen  Alcohol Swabs (B-D SINGLE USE SWABS REGULAR) PADS Use as directed. 100 each 6  . atorvastatin (LIPITOR) 40 MG tablet Take 1 tablet (40 mg total) by mouth daily. 30 tablet 6  . Blood Glucose Monitoring Suppl (ACCU-CHEK GUIDE ME) w/Device KIT Use 3 times daily before meals 1 kit 0  . DECADRON 6 MG tablet Take by mouth daily.    . Emollient (EUCERIN ADVANCED REPAIR) CREA Apply to dry skin as needed 454 g 0  . gabapentin (NEURONTIN) 300 MG capsule Take 1 capsule (300 mg total) by mouth at bedtime. 90 capsule 0  . glucose blood (ACCU-CHEK GUIDE) test strip Use as instructed 100 each 12  . ketoconazole (NIZORAL) 2 % cream Apply 1 application topically daily. 60 g 2  . levothyroxine (SYNTHROID) 137 MCG tablet TAKE 1 TABLET(137 MCG) BY MOUTH DAILY BEFORE BREAKFAST (Patient taking differently: Take 137 mcg by mouth daily before breakfast. ) 90 tablet 1  . metolazone (ZAROXOLYN) 5 MG tablet     . sevelamer carbonate (RENVELA) 800 MG tablet Take 1 tablet (800 mg total) by mouth 3 (three) times daily with meals. 90 tablet 0  . TRUEplus Lancets 33G MISC Use as instructed to check blood sugar daily. 100 each 6   No current facility-administered medications on file prior to visit.   No Known Allergies Family History  Problem Relation Age of Onset  . Hypertension Mother   . Diabetes Mother   . Bladder Cancer Brother   . Kidney  disease Other   . Colon cancer Neg Hx   . Rectal cancer Neg Hx    PE: BP 140/80   Pulse 77   Ht 6' (1.829 m)   Wt 215 lb 9.6 oz (97.8 kg)   SpO2 99%   BMI 29.24 kg/m   Wt Readings from Last 3 Encounters:  08/21/20 215 lb 9.6 oz (97.8 kg)  07/17/20 216 lb (98 kg)  05/29/20 207 lb 14.3 oz (94.3 kg)   Constitutional: overweight, in NAD Eyes: PERRLA, EOMI, + exophthalmos ENT: moist mucous membranes, no thyromegaly, no cervical lymphadenopathy Cardiovascular: RRR, No MRG, no LE edema Respiratory: CTA B Gastrointestinal: abdomen soft, NT, ND, BS+ Musculoskeletal: no deformities, strength intact in all 4 Skin: moist, warm, no rashes Neurological: no tremor with outstretched hands, DTR normal in all 4  ASSESSMENT: 1. Postablative hypothyroidism  2. H/o Graves ds - thyrotoxicosis since at least 2014  3.  Type 2 diabetes, uncontrolled, with complications - dCHF - ESRD - DR - ED  PLAN:  1.  Patient with post ablative hypothyroidism developed after RAI treatment for Graves' disease.  She was noncompliant with his levothyroxine medication in the past and he has very high TSH levels.  At last visit, he mentioned he was missing doses. - latest thyroid labs reviewed with pt >> still very high, although improving Lab Results  Component Value Date   TSH 35.088 (H) 05/23/2020   - he continues on LT4 137 mcg daily -He tells me that he misses almost 50% of his levothyroxine doses.  We discussed about health consequences of uncontrolled hypothyroidism.  I again advised him that it is absolutely mandatory not to miss any doses.  He tells me that he is occasionally taking the doses right before dialysis I advised him to move the levothyroxine bottle on his nightstand and take it as soon as he wakes up in the morning, and not before dialysis.  I also advised him that if he does not start taking medications  daily as advised repeatedly, I cannot help him and he should not return for another  appointment. - we discussed about taking the thyroid hormone every day, with water, >30 minutes before breakfast, separated by >4 hours from acid reflux medications, calcium, iron, multivitamins.  - will check thyroid tests today: TSH and fT4 - If labs are abnormal, he will need to return for repeat TFTs in 1.5 months  2.  History of Graves' disease -This was diagnosed in 2014 -He had positive TSI antibodies -He is status post RAI treatment 06/2017 -He has no active Graves' ophthalmopathy, but he does have exophthalmos -He has slight left thyromegaly but no neck compression symptoms  3.  Type 2 diabetes -He is taking the low-dose glipizide, 2.5 mg daily -At last visit, HbA1c was 5.8%, much improved.  He had another HbA1c since then, in 05/2020, at 6.7%, worse -At today's visit, HbA1c is 7.0% (higher) -I advised him to restart to check sugars every day or every other day - I sent prescriptions for a meter, lancets, strips to his pharmacy -I cannot change his regimen for now without more data but will see him back in 3 months and may need to adjust the regimen then.  Needs refills LT4.  Office Visit on 08/21/2020  Component Date Value Ref Range Status  . TSH 08/21/2020 138.59* 0.35 - 4.50 uIU/mL Final   Verified by manual dilution.  . Free T4 08/21/2020 0.14* 0.60 - 1.60 ng/dL Final   Comment: Specimens from patients who are undergoing biotin therapy and /or ingesting biotin supplements may contain high levels of biotin.  The higher biotin concentration in these specimens interferes with this Free T4 assay.  Specimens that contain high levels  of biotin may cause false high results for this Free T4 assay.  Please interpret results in light of the total clinical presentation of the patient.    . Hemoglobin A1C 08/21/2020 7.0* 4.0 - 5.6 % Final   TSH abysmal >> shows that he is not taking the levothyroxine almost at all.  As discussed at the time of the visit, if he does not start taking  this for the in the morning EVERY SINGLE DAY, I would not be able to see him.  If he starts doing so, I will have him return for labs in 5 to 6 weeks.    Philemon Kingdom, MD PhD [facility]

## 2020-08-23 DIAGNOSIS — N2581 Secondary hyperparathyroidism of renal origin: Secondary | ICD-10-CM | POA: Diagnosis not present

## 2020-08-23 DIAGNOSIS — N186 End stage renal disease: Secondary | ICD-10-CM | POA: Diagnosis not present

## 2020-08-23 DIAGNOSIS — Z992 Dependence on renal dialysis: Secondary | ICD-10-CM | POA: Diagnosis not present

## 2020-08-24 ENCOUNTER — Telehealth: Payer: Self-pay

## 2020-08-24 NOTE — Telephone Encounter (Signed)
-----   Message from Philemon Kingdom, MD sent at 08/21/2020 12:44 PM EST ----- Can you please call pt.:  His TSH level shows that he is not taking the levothyroxine almost at all.  As discussed at the time of the visit, if he does not start taking this for the in the morning EVERY SINGLE DAY, I would not be able to see him.  If he starts doing so, I would like him to return for labs in 5 to 6 weeks.

## 2020-08-24 NOTE — Telephone Encounter (Signed)
Called and left a detailed message advising patient to start Levothyroxine and call and schedule a follow up lab appointment in 5 to 6 weeks.

## 2020-08-25 ENCOUNTER — Ambulatory Visit: Payer: Medicare PPO | Admitting: Family Medicine

## 2020-08-25 DIAGNOSIS — N186 End stage renal disease: Secondary | ICD-10-CM | POA: Diagnosis not present

## 2020-08-25 DIAGNOSIS — Z992 Dependence on renal dialysis: Secondary | ICD-10-CM | POA: Diagnosis not present

## 2020-08-25 DIAGNOSIS — N2581 Secondary hyperparathyroidism of renal origin: Secondary | ICD-10-CM | POA: Diagnosis not present

## 2020-08-28 DIAGNOSIS — N186 End stage renal disease: Secondary | ICD-10-CM | POA: Diagnosis not present

## 2020-08-28 DIAGNOSIS — N2581 Secondary hyperparathyroidism of renal origin: Secondary | ICD-10-CM | POA: Diagnosis not present

## 2020-08-28 DIAGNOSIS — Z992 Dependence on renal dialysis: Secondary | ICD-10-CM | POA: Diagnosis not present

## 2020-08-29 DIAGNOSIS — J9621 Acute and chronic respiratory failure with hypoxia: Secondary | ICD-10-CM | POA: Diagnosis not present

## 2020-08-29 DIAGNOSIS — U071 COVID-19: Secondary | ICD-10-CM | POA: Diagnosis not present

## 2020-08-31 DIAGNOSIS — N2581 Secondary hyperparathyroidism of renal origin: Secondary | ICD-10-CM | POA: Diagnosis not present

## 2020-08-31 DIAGNOSIS — N186 End stage renal disease: Secondary | ICD-10-CM | POA: Diagnosis not present

## 2020-08-31 DIAGNOSIS — Z992 Dependence on renal dialysis: Secondary | ICD-10-CM | POA: Diagnosis not present

## 2020-09-01 DIAGNOSIS — Z992 Dependence on renal dialysis: Secondary | ICD-10-CM | POA: Diagnosis not present

## 2020-09-01 DIAGNOSIS — N186 End stage renal disease: Secondary | ICD-10-CM | POA: Diagnosis not present

## 2020-09-01 DIAGNOSIS — E1129 Type 2 diabetes mellitus with other diabetic kidney complication: Secondary | ICD-10-CM | POA: Diagnosis not present

## 2020-09-02 DIAGNOSIS — Z992 Dependence on renal dialysis: Secondary | ICD-10-CM | POA: Diagnosis not present

## 2020-09-02 DIAGNOSIS — N186 End stage renal disease: Secondary | ICD-10-CM | POA: Diagnosis not present

## 2020-09-02 DIAGNOSIS — N2581 Secondary hyperparathyroidism of renal origin: Secondary | ICD-10-CM | POA: Diagnosis not present

## 2020-09-04 DIAGNOSIS — N186 End stage renal disease: Secondary | ICD-10-CM | POA: Diagnosis not present

## 2020-09-04 DIAGNOSIS — I871 Compression of vein: Secondary | ICD-10-CM | POA: Diagnosis not present

## 2020-09-04 DIAGNOSIS — Z992 Dependence on renal dialysis: Secondary | ICD-10-CM | POA: Diagnosis not present

## 2020-09-04 DIAGNOSIS — T82858A Stenosis of vascular prosthetic devices, implants and grafts, initial encounter: Secondary | ICD-10-CM | POA: Diagnosis not present

## 2020-09-04 DIAGNOSIS — N2581 Secondary hyperparathyroidism of renal origin: Secondary | ICD-10-CM | POA: Diagnosis not present

## 2020-09-07 DIAGNOSIS — N2581 Secondary hyperparathyroidism of renal origin: Secondary | ICD-10-CM | POA: Diagnosis not present

## 2020-09-07 DIAGNOSIS — Z992 Dependence on renal dialysis: Secondary | ICD-10-CM | POA: Diagnosis not present

## 2020-09-07 DIAGNOSIS — N186 End stage renal disease: Secondary | ICD-10-CM | POA: Diagnosis not present

## 2020-09-09 DIAGNOSIS — Z992 Dependence on renal dialysis: Secondary | ICD-10-CM | POA: Diagnosis not present

## 2020-09-09 DIAGNOSIS — N186 End stage renal disease: Secondary | ICD-10-CM | POA: Diagnosis not present

## 2020-09-09 DIAGNOSIS — N2581 Secondary hyperparathyroidism of renal origin: Secondary | ICD-10-CM | POA: Diagnosis not present

## 2020-09-12 DIAGNOSIS — N2581 Secondary hyperparathyroidism of renal origin: Secondary | ICD-10-CM | POA: Diagnosis not present

## 2020-09-12 DIAGNOSIS — N186 End stage renal disease: Secondary | ICD-10-CM | POA: Diagnosis not present

## 2020-09-12 DIAGNOSIS — Z992 Dependence on renal dialysis: Secondary | ICD-10-CM | POA: Diagnosis not present

## 2020-09-14 DIAGNOSIS — Z992 Dependence on renal dialysis: Secondary | ICD-10-CM | POA: Diagnosis not present

## 2020-09-14 DIAGNOSIS — N186 End stage renal disease: Secondary | ICD-10-CM | POA: Diagnosis not present

## 2020-09-14 DIAGNOSIS — N2581 Secondary hyperparathyroidism of renal origin: Secondary | ICD-10-CM | POA: Diagnosis not present

## 2020-09-15 ENCOUNTER — Other Ambulatory Visit: Payer: Self-pay

## 2020-09-15 ENCOUNTER — Ambulatory Visit: Payer: Medicare PPO | Attending: Family Medicine | Admitting: Family Medicine

## 2020-09-15 ENCOUNTER — Encounter: Payer: Self-pay | Admitting: Family Medicine

## 2020-09-15 VITALS — BP 95/60 | HR 74 | Ht 72.0 in | Wt 215.0 lb

## 2020-09-15 DIAGNOSIS — E1122 Type 2 diabetes mellitus with diabetic chronic kidney disease: Secondary | ICD-10-CM

## 2020-09-15 DIAGNOSIS — R42 Dizziness and giddiness: Secondary | ICD-10-CM

## 2020-09-15 DIAGNOSIS — N186 End stage renal disease: Secondary | ICD-10-CM | POA: Diagnosis not present

## 2020-09-15 DIAGNOSIS — M2550 Pain in unspecified joint: Secondary | ICD-10-CM

## 2020-09-15 DIAGNOSIS — M5126 Other intervertebral disc displacement, lumbar region: Secondary | ICD-10-CM

## 2020-09-15 DIAGNOSIS — Z992 Dependence on renal dialysis: Secondary | ICD-10-CM

## 2020-09-15 DIAGNOSIS — E890 Postprocedural hypothyroidism: Secondary | ICD-10-CM | POA: Diagnosis not present

## 2020-09-15 MED ORDER — GABAPENTIN 300 MG PO CAPS: 300.0000 mg | ORAL_CAPSULE | Freq: Every day | ORAL | 1 refills | Status: DC

## 2020-09-15 MED ORDER — TIZANIDINE HCL 4 MG PO TABS: 4.0000 mg | ORAL_TABLET | Freq: Four times a day (QID) | ORAL | 0 refills | Status: DC | PRN

## 2020-09-15 MED ORDER — DICLOFENAC SODIUM 1 % EX GEL: 4.0000 g | Freq: Four times a day (QID) | CUTANEOUS | 1 refills | Status: DC

## 2020-09-15 MED ORDER — MECLIZINE HCL 25 MG PO TABS: 25.0000 mg | ORAL_TABLET | Freq: Three times a day (TID) | ORAL | 1 refills | Status: DC | PRN

## 2020-09-15 NOTE — Patient Instructions (Signed)

## 2020-09-15 NOTE — Progress Notes (Signed)
Subjective:  Patient ID: Edward [name], male    DOB: 1971/04/11  Age: 49 y.o. MRN: [id number]  CC: Diabetes   HPI Edward DELAHOUSSAYE Sr. is a 49 year old male with a history of hypertension, type 2 diabetes mellitus (A1c7.0), Graves' disease (status post radioactive iodine treatment in 07/2017), ESRD (on HD M,W,F), erectile dysfunction who presents today for a follow-up visit. He had a visit to his Endocrinologist last month for management of his Graves' disease and has been  noncompliant with his thyroid medications.   Complains of pain in his ankles, knees, hands, lower back. He has cramps in his hands worse with dialysis but states cramps dates back to prior to his commencing dialysis.  Bilateral knee x-rays, right hand x-ray was unremarkable from 12/2018.  Back pain shoots down his legs. Previously received ESI with no relief MRI Lumbar spine 04/2008: IMPRESSION:  Enlargement of a right posterolateral disc herniation at L5-S1 with  compression of the right S1 nerve root  Mild facet degeneration L4-5.   He has had dizziness and endorses presence of the room spinning when he turns his head quickly or changes position quickly. His diabetes is diet controlled.  Past Medical History:  Diagnosis Date  . Acute on chronic renal failure ([place]) 11/25/2015  . Anemia   . Arthritis   . Blind    Left eye  . Cataract   . CHF (congestive heart failure) ([place])   . Chronic kidney disease    stage 5  . Depression    situatuional  . Diabetes mellitus    Type II  . Graves disease 2014  . Headache    in past  . Hx of adenomatous and sessile serrated colonic polyps 11/21/2019  . Hyperlipidemia   . Hypertension   . Hypothyroidism   . Legally blind    right eye  . Neuropathy   . Non-compliance   . Proteinuria 05/24/2020  . Shortness of breath dyspnea    "Better since I've been taking my medications"    Past Surgical History:  Procedure Laterality Date  . AV FISTULA  PLACEMENT Right 04/22/2019   Procedure: RIGHT ARM ARTERIOVENOUS (AV) FISTULA CREATION;  Surgeon: Angelia Mould, MD;  Location: [facility];  Service: Vascular;  Laterality: Right;  . CIRCUMCISION     as a child, around 42 years of age  . EYE SURGERY Bilateral    "several "  . PARS PLANA VITRECTOMY Right 03/25/2016   Procedure: PARS PLANA VITRECTOMY 25 GAUGE FOR ENDOPHTHALMITIS;  Surgeon: Jalene Mullet, MD;  Location: [facility];  Service: Ophthalmology;  Laterality: Right;  . WISDOM TOOTH EXTRACTION      Family History  Problem Relation Age of Onset  . Hypertension Mother   . Diabetes Mother   . Bladder Cancer Brother   . Kidney disease Other   . Colon cancer Neg Hx   . Rectal cancer Neg Hx     No Known Allergies  Outpatient Medications Prior to Visit  Medication Sig Dispense Refill  . Accu-Chek Softclix Lancets lancets Use as instructed 100 each 12  . acetaminophen (TYLENOL) 500 MG tablet Take 500 mg by mouth every 6 (six) hours as needed.    . Alcohol Swabs (B-D SINGLE USE SWABS REGULAR) PADS Use as directed. 100 each 6  . atorvastatin (LIPITOR) 40 MG tablet Take 1 tablet (40 mg total) by mouth daily. 30 tablet 6  . Blood Glucose Monitoring Suppl (ACCU-CHEK GUIDE ME) w/Device KIT Use 3 times daily  before meals 1 kit 0  . DECADRON 6 MG tablet Take by mouth daily.    . Emollient (EUCERIN ADVANCED REPAIR) CREA Apply to dry skin as needed 454 g 0  . glucose blood (ACCU-CHEK GUIDE) test strip Use as instructed 100 each 12  . ketoconazole (NIZORAL) 2 % cream Apply 1 application topically daily. 60 g 2  . levothyroxine (SYNTHROID) 137 MCG tablet TAKE 1 TABLET(137 MCG) BY MOUTH DAILY BEFORE BREAKFAST 45 tablet 5  . sevelamer carbonate (RENVELA) 800 MG tablet Take 1 tablet (800 mg total) by mouth 3 (three) times daily with meals. 90 tablet 0  . gabapentin (NEURONTIN) 300 MG capsule Take 1 capsule (300 mg total) by mouth at bedtime. 90 capsule 0  . metolazone (ZAROXOLYN) 5 MG tablet   (Patient not taking: No sig reported)     No facility-administered medications prior to visit.     ROS Review of Systems  Constitutional: Negative for activity change and appetite change.  HENT: Negative for sinus pressure and sore throat.   Eyes: Negative for visual disturbance.  Respiratory: Negative for cough, chest tightness and shortness of breath.   Cardiovascular: Negative for chest pain and leg swelling.  Gastrointestinal: Negative for abdominal distention, abdominal pain, constipation and diarrhea.  Endocrine: Negative.   Genitourinary: Negative for dysuria.  Musculoskeletal:       See HPI  Skin: Negative for rash.  Allergic/Immunologic: Negative.   Neurological: Negative for weakness, light-headedness and numbness.  Psychiatric/Behavioral: Negative for dysphoric mood and suicidal ideas.    Objective:  BP 95/60   Pulse 74   Ht 6' (1.829 m)   Wt 215 lb (97.5 kg)   SpO2 98%   BMI 29.16 kg/m   BP/Weight 09/15/2020 08/21/2020 40/98/1191  Systolic BP 95 478 295  Diastolic BP 60 80 75  Wt. (Lbs) 215 215.6 216  BMI 29.16 29.24 29.29      Physical Exam Constitutional:      Appearance: He is well-developed.  Neck:     Vascular: No JVD.  Cardiovascular:     Rate and Rhythm: Normal rate.     Heart sounds: Normal heart sounds. No murmur heard.   Pulmonary:     Effort: Pulmonary effort is normal.     Breath sounds: Normal breath sounds. No wheezing or rales.  Chest:     Chest wall: No tenderness.  Abdominal:     General: Bowel sounds are normal. There is no distension.     Palpations: Abdomen is soft. There is no mass.     Tenderness: There is no abdominal tenderness.  Musculoskeletal:        General: Normal range of motion.     Right lower leg: No edema.     Left lower leg: No edema.  Neurological:     Mental Status: He is alert and oriented to person, place, and time.  Psychiatric:        Mood and Affect: Mood normal.     CMP Latest Ref Rng &  Units 05/28/2020 05/27/2020 05/26/2020  Glucose 70 - 99 mg/dL 172(H) 218(H) 248(H)  BUN 6 - 20 mg/dL 75(H) 102(H) 93(H)  Creatinine 0.61 - 1.24 mg/dL 5.30(H) 6.39(H) 6.94(H)  Sodium 135 - 145 mmol/L 138 141 139  Potassium 3.5 - 5.1 mmol/L 4.6 4.6 4.4  Chloride 98 - 111 mmol/L 102 106 105  CO2 22 - 32 mmol/L 21(L) 20(L) 20(L)  Calcium 8.9 - 10.3 mg/dL 8.7(L) 9.3 8.7(L)  Total Protein 6.5 - 8.1  g/dL 7.2 7.5 7.0  Total Bilirubin 0.3 - 1.2 mg/dL 0.8 0.7 0.7  Alkaline Phos 38 - 126 U/L 66 70 60  AST 15 - 41 U/L 57(H) 39 37  ALT 0 - 44 U/L 40 32 32    Lipid Panel     Component Value Date/Time   CHOL 137 10/21/2019 0854   TRIG 116 10/21/2019 0854   HDL 55 10/21/2019 0854   CHOLHDL 2.5 10/21/2019 0854   CHOLHDL 2.8 09/19/2016 1023   VLDL 37 (H) 09/19/2016 1023   LDLCALC 61 10/21/2019 0854    CBC    Component Value Date/Time   WBC 8.0 05/28/2020 0436   RBC 4.14 (L) 05/28/2020 0436   HGB 13.1 05/28/2020 0436   HCT 40.4 05/28/2020 0436   PLT 287 05/28/2020 0436   MCV 97.6 05/28/2020 0436   MCH 31.6 05/28/2020 0436   MCHC 32.4 05/28/2020 0436   RDW 15.4 05/28/2020 0436   LYMPHSABS 1.0 05/28/2020 0436   MONOABS 0.2 05/28/2020 0436   EOSABS 0.0 05/28/2020 0436   BASOSABS 0.1 05/28/2020 0436    Lab Results  Component Value Date   HGBA1C 7.0 (A) 08/21/2020    Assessment & Plan:  1. Type 2 diabetes mellitus with chronic kidney disease on chronic dialysis, without long-term current use of insulin (HCC) Diet controlled with A1c of 7.0 Counseled on Diabetic diet, my plate method, 683 minutes of moderate intensity exercise/week Blood sugar logs with fasting goals of 80-120 mg/dl, random of less than 180 and in the event of sugars less than 60 mg/dl or greater than 400 mg/dl encouraged to notify the clinic. Advised on the need for annual eye exams, annual foot exams, Pneumonia vaccine.   2. Arthralgia, unspecified joint Likely underlying osteoarthritis He is unable to take NSAIDs  due to CKD - Ambulatory referral to Physical Therapy - gabapentin (NEURONTIN) 300 MG capsule; Take 1 capsule (300 mg total) by mouth at bedtime.  Dispense: 90 capsule; Refill: 1 - diclofenac Sodium (VOLTAREN) 1 % GEL; Apply 4 g topically 4 (four) times daily.  Dispense: 100 g; Refill: 1  3. Vertigo We will initiate treatment for vertigo His blood pressure is also on the hypotensive side If meclizine is ineffective we will consider adding midodrine as dizziness could be arising from his low blood pressure - meclizine (ANTIVERT) 25 MG tablet; Take 1 tablet (25 mg total) by mouth 3 (three) times daily as needed for dizziness.  Dispense: 30 tablet; Refill: 1  4. Postablative hypothyroidism Management as per endocrine  5. Herniated lumbar intervertebral disc Uncontrolled We will initiate tizanidine and referred to PT If symptoms are uncontrolled consider referral to spine specialist - tiZANidine (ZANAFLEX) 4 MG tablet; Take 1 tablet (4 mg total) by mouth every 6 (six) hours as needed for muscle spasms.  Dispense: 30 tablet; Refill: 0 - Ambulatory referral to Physical Therapy - diclofenac Sodium (VOLTAREN) 1 % GEL; Apply 4 g topically 4 (four) times daily.  Dispense: 100 g; Refill: 1     Meds ordered this encounter  Medications  . tiZANidine (ZANAFLEX) 4 MG tablet    Sig: Take 1 tablet (4 mg total) by mouth every 6 (six) hours as needed for muscle spasms.    Dispense:  30 tablet    Refill:  0  . gabapentin (NEURONTIN) 300 MG capsule    Sig: Take 1 capsule (300 mg total) by mouth at bedtime.    Dispense:  90 capsule    Refill:  1  .  diclofenac Sodium (VOLTAREN) 1 % GEL    Sig: Apply 4 g topically 4 (four) times daily.    Dispense:  100 g    Refill:  1  . meclizine (ANTIVERT) 25 MG tablet    Sig: Take 1 tablet (25 mg total) by mouth 3 (three) times daily as needed for dizziness.    Dispense:  30 tablet    Refill:  1    Follow-up: Return in about 6 months (around 03/16/2021) for  Chronic disease management.       [name], MD, FAAFP. [facility], [place]   09/15/2020, 11:45 AM

## 2020-09-15 NOTE — Progress Notes (Signed)
Having pain in legs ankles back and knees.

## 2020-09-16 DIAGNOSIS — N186 End stage renal disease: Secondary | ICD-10-CM | POA: Diagnosis not present

## 2020-09-16 DIAGNOSIS — N2581 Secondary hyperparathyroidism of renal origin: Secondary | ICD-10-CM | POA: Diagnosis not present

## 2020-09-16 DIAGNOSIS — Z992 Dependence on renal dialysis: Secondary | ICD-10-CM | POA: Diagnosis not present

## 2020-09-17 ENCOUNTER — Emergency Department (HOSPITAL_COMMUNITY): Payer: Medicare PPO

## 2020-09-17 ENCOUNTER — Emergency Department (HOSPITAL_COMMUNITY)
Admission: EM | Admit: 2020-09-17 | Discharge: 2020-09-17 | Disposition: A | Discharge status: Home / Self Care | Payer: Medicare PPO | Attending: Emergency Medicine | Admitting: Emergency Medicine

## 2020-09-17 DIAGNOSIS — Z20822 Contact with and (suspected) exposure to covid-19: Secondary | ICD-10-CM | POA: Diagnosis not present

## 2020-09-17 DIAGNOSIS — E1122 Type 2 diabetes mellitus with diabetic chronic kidney disease: Secondary | ICD-10-CM | POA: Insufficient documentation

## 2020-09-17 DIAGNOSIS — N3289 Other specified disorders of bladder: Secondary | ICD-10-CM | POA: Diagnosis not present

## 2020-09-17 DIAGNOSIS — R197 Diarrhea, unspecified: Secondary | ICD-10-CM | POA: Diagnosis not present

## 2020-09-17 DIAGNOSIS — I700 Atherosclerosis of aorta: Secondary | ICD-10-CM | POA: Diagnosis not present

## 2020-09-17 DIAGNOSIS — E039 Hypothyroidism, unspecified: Secondary | ICD-10-CM | POA: Diagnosis not present

## 2020-09-17 DIAGNOSIS — R9431 Abnormal electrocardiogram [ECG] [EKG]: Secondary | ICD-10-CM | POA: Diagnosis not present

## 2020-09-17 DIAGNOSIS — E1140 Type 2 diabetes mellitus with diabetic neuropathy, unspecified: Secondary | ICD-10-CM | POA: Diagnosis not present

## 2020-09-17 DIAGNOSIS — Z87891 Personal history of nicotine dependence: Secondary | ICD-10-CM | POA: Insufficient documentation

## 2020-09-17 DIAGNOSIS — R064 Hyperventilation: Secondary | ICD-10-CM | POA: Insufficient documentation

## 2020-09-17 DIAGNOSIS — R109 Unspecified abdominal pain: Secondary | ICD-10-CM | POA: Diagnosis not present

## 2020-09-17 DIAGNOSIS — R52 Pain, unspecified: Secondary | ICD-10-CM | POA: Diagnosis not present

## 2020-09-17 DIAGNOSIS — I5032 Chronic diastolic (congestive) heart failure: Secondary | ICD-10-CM | POA: Diagnosis not present

## 2020-09-17 DIAGNOSIS — I132 Hypertensive heart and chronic kidney disease with heart failure and with stage 5 chronic kidney disease, or end stage renal disease: Secondary | ICD-10-CM | POA: Diagnosis not present

## 2020-09-17 DIAGNOSIS — R1110 Vomiting, unspecified: Secondary | ICD-10-CM | POA: Diagnosis not present

## 2020-09-17 DIAGNOSIS — E11319 Type 2 diabetes mellitus with unspecified diabetic retinopathy without macular edema: Secondary | ICD-10-CM | POA: Insufficient documentation

## 2020-09-17 DIAGNOSIS — Z95828 Presence of other vascular implants and grafts: Secondary | ICD-10-CM | POA: Insufficient documentation

## 2020-09-17 DIAGNOSIS — N186 End stage renal disease: Secondary | ICD-10-CM | POA: Diagnosis not present

## 2020-09-17 DIAGNOSIS — Z992 Dependence on renal dialysis: Secondary | ICD-10-CM | POA: Diagnosis not present

## 2020-09-17 DIAGNOSIS — R1084 Generalized abdominal pain: Secondary | ICD-10-CM

## 2020-09-17 DIAGNOSIS — N400 Enlarged prostate without lower urinary tract symptoms: Secondary | ICD-10-CM | POA: Diagnosis not present

## 2020-09-17 DIAGNOSIS — R55 Syncope and collapse: Secondary | ICD-10-CM | POA: Diagnosis not present

## 2020-09-17 DIAGNOSIS — R000 Tachycardia, unspecified: Secondary | ICD-10-CM | POA: Diagnosis not present

## 2020-09-17 LAB — CBC WITH DIFFERENTIAL/PLATELET
Abs Immature Granulocytes: 0.02 10*3/uL (ref 0.00–0.07)
Basophils Absolute: 0 10*3/uL (ref 0.0–0.1)
Basophils Relative: 0 %
Eosinophils Absolute: 0 10*3/uL (ref 0.0–0.5)
Eosinophils Relative: 0 %
HCT: 34 % — ABNORMAL LOW (ref 39.0–52.0)
Hemoglobin: 11.5 g/dL — ABNORMAL LOW (ref 13.0–17.0)
Immature Granulocytes: 0 %
Lymphocytes Relative: 16 %
Lymphs Abs: 1 10*3/uL (ref 0.7–4.0)
MCH: 35.3 pg — ABNORMAL HIGH (ref 26.0–34.0)
MCHC: 33.8 g/dL (ref 30.0–36.0)
MCV: 104.3 fL — ABNORMAL HIGH (ref 80.0–100.0)
Monocytes Absolute: 0.2 10*3/uL (ref 0.1–1.0)
Monocytes Relative: 4 %
Neutro Abs: 5.1 10*3/uL (ref 1.7–7.7)
Neutrophils Relative %: 80 %
Platelets: 205 10*3/uL (ref 150–400)
RBC: 3.26 MIL/uL — ABNORMAL LOW (ref 4.22–5.81)
RDW: 16.8 % — ABNORMAL HIGH (ref 11.5–15.5)
WBC: 6.3 10*3/uL (ref 4.0–10.5)
nRBC: 0 % (ref 0.0–0.2)

## 2020-09-17 LAB — COMPREHENSIVE METABOLIC PANEL
ALT: 18 U/L (ref 0–44)
AST: 19 U/L (ref 15–41)
Albumin: 4.2 g/dL (ref 3.5–5.0)
Alkaline Phosphatase: 66 U/L (ref 38–126)
Anion gap: 21 — ABNORMAL HIGH (ref 5–15)
BUN: 27 mg/dL — ABNORMAL HIGH (ref 6–20)
CO2: 29 mmol/L (ref 22–32)
Calcium: 9.5 mg/dL (ref 8.9–10.3)
Chloride: 91 mmol/L — ABNORMAL LOW (ref 98–111)
Creatinine, Ser: 12.43 mg/dL — ABNORMAL HIGH (ref 0.61–1.24)
GFR, Estimated: 4 mL/min — ABNORMAL LOW (ref >60)
Glucose, Bld: 138 mg/dL — ABNORMAL HIGH (ref 70–99)
Potassium: 3.7 mmol/L (ref 3.5–5.1)
Sodium: 141 mmol/L (ref 135–145)
Total Bilirubin: 0.6 mg/dL (ref 0.3–1.2)
Total Protein: 8.3 g/dL — ABNORMAL HIGH (ref 6.5–8.1)

## 2020-09-17 LAB — RESP PANEL BY RT-PCR (FLU A&B, COVID) ARPGX2
Influenza A by PCR: NEGATIVE
Influenza B by PCR: NEGATIVE
SARS Coronavirus 2 by RT PCR: NEGATIVE

## 2020-09-17 LAB — LACTIC ACID, PLASMA: Lactic Acid, Venous: 2.7 mmol/L (ref 0.5–1.9)

## 2020-09-17 LAB — LIPASE, BLOOD: Lipase: 90 U/L — ABNORMAL HIGH (ref 11–51)

## 2020-09-17 MED ORDER — OXYCODONE HCL 5 MG PO TABS: 2.5000 mg | ORAL_TABLET | ORAL | 0 refills | Status: DC | PRN

## 2020-09-17 MED ORDER — HYDROMORPHONE HCL 1 MG/ML IJ SOLN
1.0000 mg | Freq: Once | INTRAMUSCULAR | Status: AC
  Administered 2020-09-17: 1 mg via INTRAVENOUS
  Filled 2020-09-17: qty 1

## 2020-09-17 MED ORDER — CEPHALEXIN 500 MG PO CAPS: 500.0000 mg | ORAL_CAPSULE | Freq: Four times a day (QID) | ORAL | 0 refills | Status: DC

## 2020-09-17 MED ORDER — ONDANSETRON HCL 4 MG/2ML IJ SOLN
4.0000 mg | Freq: Once | INTRAMUSCULAR | Status: AC
  Administered 2020-09-17: 4 mg via INTRAVENOUS

## 2020-09-17 MED ORDER — ONDANSETRON HCL 4 MG/2ML IJ SOLN
INTRAMUSCULAR | Status: AC
  Filled 2020-09-17: qty 2

## 2020-09-17 MED ORDER — ONDANSETRON 4 MG PO TBDP: ORAL_TABLET | ORAL | 0 refills | Status: DC

## 2020-09-17 MED ORDER — OXYCODONE HCL 5 MG PO TABS
5.0000 mg | ORAL_TABLET | Freq: Once | ORAL | Status: AC
Start: 2020-09-17 — End: 2020-09-17
  Administered 2020-09-17: 5 mg via ORAL
  Filled 2020-09-17: qty 1

## 2020-09-17 MED ORDER — OXYCODONE HCL 5 MG PO TABS: 5.0000 mg | ORAL_TABLET | ORAL | 0 refills | Status: DC | PRN

## 2020-09-17 MED ORDER — ONDANSETRON 4 MG PO TBDP
4.0000 mg | ORAL_TABLET | Freq: Once | ORAL | Status: AC
  Administered 2020-09-17: 4 mg via ORAL
  Filled 2020-09-17: qty 1

## 2020-09-17 NOTE — ED Triage Notes (Signed)
Pt arrives via EMS from home with complaints of abdominal pain X2 days with diarrhea. Pt yelling in triage d/t pain. Pt blind . Completes HD M/W/F and does not miss. Pt began to hyperventilate with EMS d/t and had a syncopal episode lasting less than 1 min. Pt oriented X4 190/90 rr 30 P 90  gave 4mg  zofran

## 2020-09-17 NOTE — Discharge Instructions (Signed)
Follow up with your family doc in the office.  Return for worsening pain, fever.

## 2020-09-17 NOTE — ED Provider Notes (Signed)
49 year old male with a chief complaint of diffuse abdominal pain nausea vomiting and diarrhea.  I received the patient in signout from Dr. Regenia Skeeter.  Briefly the plan was for reassessment and lab work and a CT scan.  Patient CT scan with possible UTI.  Patient is end-stage renal disease and only urinates about once a day or so.  Has had some mild symptomatology with this.  Denies flank pain denies fever.  Feeling much better after his initial treatment with pain medicine and nausea medicine.  Will start on antibiotics and have him follow-up as an outpatient.  We will have him return for worsening pain fever her inability to eat or drink.   Deno Etienne, DO 09/17/20 2034

## 2020-09-17 NOTE — ED Provider Notes (Signed)
[facility] EMERGENCY DEPARTMENT Provider Note   CSN: [id number] Arrival date & time: 09/17/20  1348     History Chief Complaint  Patient presents with  . Abdominal Pain    Edward BERNARDS Sr. is a 49 y.o. male.  HPI 49 year old male presents with abdominal pain.  Has had severe abdominal pain for the last couple days.  Has had some vomiting and diarrhea though he will not quantify how much.  He has not had a fever or chest pain.  He is on dialysis.  Still makes a small amount of urine.  There is no back pain.  EMS notes the patient was hyperventilating and briefly passed out while in the truck.   Past Medical History:  Diagnosis Date  . Acute on chronic renal failure ([place]) 11/25/2015  . Anemia   . Arthritis   . Blind    Left eye  . Cataract   . CHF (congestive heart failure) ([place])   . Chronic kidney disease    stage 5  . Depression    situatuional  . Diabetes mellitus    Type II  . Graves disease 2014  . Headache    in past  . Hx of adenomatous and sessile serrated colonic polyps 11/21/2019  . Hyperlipidemia   . Hypertension   . Hypothyroidism   . Legally blind    right eye  . Neuropathy   . Non-compliance   . Proteinuria 05/24/2020  . Shortness of breath dyspnea    "Better since I've been taking my medications"    Patient Active Problem List   Diagnosis Date Noted  . ESRD (end stage renal disease) on dialysis ([place])   . Hypertension associated with stage 5 chronic kidney disease due to type 2 diabetes mellitus ([place]) 05/24/2020  . Hyperphosphatemia 05/24/2020  . Proteinuria 05/24/2020  . Legally blind 05/24/2020  . Diabetic retinopathy of both eyes ([place]) 05/24/2020  . Acute metabolic encephalopathy 68/12/2120  . Acute hypoxemic respiratory failure due to COVID-19 ([place]) 05/24/2020  . Pneumonia due to COVID-19 virus 05/23/2020  . Chronic diastolic heart failure ([place]) 10/24/2018  . 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 Mitral  regurgitation, Moderate 09/05/2018  . ED (erectile dysfunction) 08/10/2017  . Chronic kidney disease, stage 5 ([place]) 07/18/2017  . Graves disease 03/02/2016  . Non compliance with medical treatment 11/26/2015  . Uncontrolled hypertension 11/26/2015  . Acute on chronic renal failure ([place]) 11/25/2015  . DM (diabetes mellitus), type 2 with ophthalmic complications ([place]) 48/25/0037    Past Surgical History:  Procedure Laterality Date  . AV FISTULA PLACEMENT Right 04/22/2019   Procedure: RIGHT ARM ARTERIOVENOUS (AV) FISTULA CREATION;  Surgeon: Angelia Mould, MD;  Location: [facility];  Service: Vascular;  Laterality: Right;  . CIRCUMCISION     as a child, around 35 years of age  . EYE SURGERY Bilateral    "several "  . PARS PLANA VITRECTOMY Right 03/25/2016   Procedure: PARS PLANA VITRECTOMY 25 GAUGE FOR ENDOPHTHALMITIS;  Surgeon: Jalene Mullet, MD;  Location: [facility];  Service: Ophthalmology;  Laterality: Right;  . WISDOM TOOTH EXTRACTION         Family History  Problem Relation Age of Onset  . Hypertension Mother   . Diabetes Mother   . Bladder Cancer Brother   . Kidney disease Other   . Colon cancer Neg Hx   . Rectal cancer Neg Hx     Social History   Tobacco Use  . Smoking status: Former Smoker  Packs/day: 0.75    Years: 3.00    Pack years: 2.25    Types: Cigarettes    Quit date: 05/28/2000    Years since quitting: 20.3  . Smokeless tobacco: Never Used  Vaping Use  . Vaping Use: Never used  Substance Use Topics  . Alcohol use: Yes    Alcohol/week: 0.0 standard drinks    Comment: 1 beer  ocassional  . Drug use: No    Home Medications Prior to Admission medications   Medication Sig Start Date End Date Taking? Authorizing Provider  Accu-Chek Softclix Lancets lancets Use as instructed 08/21/20   Philemon Kingdom, MD  acetaminophen (TYLENOL) 500 MG tablet Take 500 mg by mouth every 6 (six) hours as needed.    [provider]  Alcohol Swabs (B-D SINGLE  USE SWABS REGULAR) PADS Use as directed. 01/09/20   [name], MD  atorvastatin (LIPITOR) 40 MG tablet Take 1 tablet (40 mg total) by mouth daily. 10/22/19   [name], MD  Blood Glucose Monitoring Suppl (ACCU-CHEK GUIDE ME) w/Device KIT Use 3 times daily before meals 08/21/20   Philemon Kingdom, MD  DECADRON 6 MG tablet Take by mouth daily. 06/03/20   [provider]  diclofenac Sodium (VOLTAREN) 1 % GEL Apply 4 g topically 4 (four) times daily. 09/15/20   [name], MD  Emollient [facility] ADVANCED REPAIR) CREA Apply to dry skin as needed 07/17/20   Gildardo Pounds, NP  gabapentin (NEURONTIN) 300 MG capsule Take 1 capsule (300 mg total) by mouth at bedtime. 09/15/20 12/14/20  [name], MD  glucose blood (ACCU-CHEK GUIDE) test strip Use as instructed 08/21/20   Philemon Kingdom, MD  ketoconazole (NIZORAL) 2 % cream Apply 1 application topically daily. 08/11/20   Criselda Peaches, DPM  levothyroxine (SYNTHROID) 137 MCG tablet TAKE 1 TABLET(137 MCG) BY MOUTH DAILY BEFORE BREAKFAST 08/21/20   Philemon Kingdom, MD  meclizine (ANTIVERT) 25 MG tablet Take 1 tablet (25 mg total) by mouth 3 (three) times daily as needed for dizziness. 09/15/20   [name], MD  metolazone (ZAROXOLYN) 5 MG tablet  04/05/20   [provider]  sevelamer carbonate (RENVELA) 800 MG tablet Take 1 tablet (800 mg total) by mouth 3 (three) times daily with meals. 05/29/20   Daisy Floro, DO  tiZANidine (ZANAFLEX) 4 MG tablet Take 1 tablet (4 mg total) by mouth every 6 (six) hours as needed for muscle spasms. 09/15/20   [name], MD    Allergies    Patient has no known allergies.  Review of Systems   Review of Systems  Constitutional: Negative for fever.  Respiratory: Negative for shortness of breath.   Cardiovascular: Negative for chest pain.  Gastrointestinal: Positive for abdominal pain, diarrhea and vomiting.  Musculoskeletal: Negative for back pain.  All other  systems reviewed and are negative.   Physical Exam Updated Vital Signs BP 119/82 (BP Location: Right Arm)   Pulse 79   Temp 98.8 F (37.1 C) (Oral)   Resp 15   Ht 6' (1.829 m)   Wt 97 kg   SpO2 100%   BMI 29.00 kg/m   Physical Exam Vitals and nursing note reviewed.  Constitutional:      General: He is in acute distress (in pain).     Appearance: He is well-developed and well-nourished. He is not diaphoretic.  HENT:     Head: Normocephalic and atraumatic.     Right Ear: External ear normal.     Left Ear:  External ear normal.     Nose: Nose normal.  Eyes:     General:        Right eye: No discharge.        Left eye: No discharge.  Cardiovascular:     Rate and Rhythm: Normal rate and regular rhythm.     Heart sounds: Normal heart sounds.  Pulmonary:     Effort: Pulmonary effort is normal.     Breath sounds: Normal breath sounds.  Abdominal:     Palpations: Abdomen is soft.     Tenderness: There is generalized abdominal tenderness (mild, hard to localize).  Musculoskeletal:        General: No edema.     Cervical back: Neck supple.  Skin:    General: Skin is warm and dry.  Neurological:     Mental Status: He is alert.  Psychiatric:        Mood and Affect: Mood is not anxious.     ED Results / Procedures / Treatments   Labs (all labs ordered are listed, but only abnormal results are displayed) Labs Reviewed  RESP PANEL BY RT-PCR (FLU A&B, COVID) [id number]  COMPREHENSIVE METABOLIC PANEL  LIPASE, BLOOD  CBC WITH DIFFERENTIAL/PLATELET  LACTIC ACID, PLASMA  LACTIC ACID, PLASMA    EKG EKG Interpretation  Date/Time:  Thursday September 17 2020 13:52:40 EST Ventricular Rate:  81 PR Interval:    QRS Duration: 157 QT Interval:  414 QTC Calculation: 481 R Axis:   9 Text Interpretation: Sinus rhythm Nonspecific intraventricular conduction delay Borderline T abnormalities, lateral leads Confirmed by Sherwood Gambler [phone]) on 09/17/2020 1:57:53 PM   Radiology No  results found.  Procedures Procedures (including critical care time)  Medications Ordered in ED Medications  HYDROmorphone (DILAUDID) injection 1 mg (1 mg Intravenous Given 09/17/20 1413)  ondansetron (ZOFRAN) injection 4 mg (4 mg Intravenous Given 09/17/20 1424)    ED Course  I have reviewed the triage vital signs and the nursing notes.  Pertinent labs & imaging results that were available during my care of the patient were reviewed by me and considered in my medical decision making (see chart for details).    MDM Rules/Calculators/A&P                          Patient does seem to be better and IV dilaudid and zofran. Generalized abdominal discomfort.  Will get CT abdomen and pelvis.  Will send lactate to help screen for mesenteric ischemia.  While this is on the differential it is not particularly high but unable to fully rule out just on history and physical.  If the lactate is high he may need CT angiography, which would in the ideal world be not performed given he still makes urine.  Labs currently pending.  Care transferred to Dr. Tyrone Nine. Final Clinical Impression(s) / ED Diagnoses Final diagnoses:  None    Rx / DC Orders ED Discharge Orders    None       Sherwood Gambler, MD 09/17/20 1531

## 2020-09-18 DIAGNOSIS — N186 End stage renal disease: Secondary | ICD-10-CM | POA: Diagnosis not present

## 2020-09-18 DIAGNOSIS — Z992 Dependence on renal dialysis: Secondary | ICD-10-CM | POA: Diagnosis not present

## 2020-09-18 DIAGNOSIS — N2581 Secondary hyperparathyroidism of renal origin: Secondary | ICD-10-CM | POA: Diagnosis not present

## 2020-09-21 DIAGNOSIS — N2581 Secondary hyperparathyroidism of renal origin: Secondary | ICD-10-CM | POA: Diagnosis not present

## 2020-09-21 DIAGNOSIS — N186 End stage renal disease: Secondary | ICD-10-CM | POA: Diagnosis not present

## 2020-09-21 DIAGNOSIS — Z992 Dependence on renal dialysis: Secondary | ICD-10-CM | POA: Diagnosis not present

## 2020-09-23 ENCOUNTER — Encounter (HOSPITAL_COMMUNITY): Payer: Self-pay | Admitting: Emergency Medicine

## 2020-09-23 ENCOUNTER — Emergency Department (HOSPITAL_COMMUNITY)
Admission: EM | Admit: 2020-09-23 | Discharge: 2020-09-23 | Disposition: A | Discharge status: Home / Self Care | Payer: Medicare PPO | Attending: Emergency Medicine | Admitting: Emergency Medicine

## 2020-09-23 ENCOUNTER — Other Ambulatory Visit: Payer: Self-pay

## 2020-09-23 DIAGNOSIS — R1084 Generalized abdominal pain: Secondary | ICD-10-CM | POA: Diagnosis not present

## 2020-09-23 DIAGNOSIS — N390 Urinary tract infection, site not specified: Secondary | ICD-10-CM | POA: Diagnosis not present

## 2020-09-23 DIAGNOSIS — Z992 Dependence on renal dialysis: Secondary | ICD-10-CM | POA: Insufficient documentation

## 2020-09-23 DIAGNOSIS — Z5321 Procedure and treatment not carried out due to patient leaving prior to being seen by health care provider: Secondary | ICD-10-CM | POA: Insufficient documentation

## 2020-09-23 DIAGNOSIS — R112 Nausea with vomiting, unspecified: Secondary | ICD-10-CM | POA: Diagnosis not present

## 2020-09-23 DIAGNOSIS — I10 Essential (primary) hypertension: Secondary | ICD-10-CM | POA: Diagnosis not present

## 2020-09-23 DIAGNOSIS — R0902 Hypoxemia: Secondary | ICD-10-CM | POA: Diagnosis not present

## 2020-09-23 DIAGNOSIS — R109 Unspecified abdominal pain: Secondary | ICD-10-CM | POA: Diagnosis not present

## 2020-09-23 DIAGNOSIS — R52 Pain, unspecified: Secondary | ICD-10-CM | POA: Diagnosis not present

## 2020-09-23 LAB — CBC
HCT: 35.8 % — ABNORMAL LOW (ref 39.0–52.0)
Hemoglobin: 12.6 g/dL — ABNORMAL LOW (ref 13.0–17.0)
MCH: 36.2 pg — ABNORMAL HIGH (ref 26.0–34.0)
MCHC: 35.2 g/dL (ref 30.0–36.0)
MCV: 102.9 fL — ABNORMAL HIGH (ref 80.0–100.0)
Platelets: 308 10*3/uL (ref 150–400)
RBC: 3.48 MIL/uL — ABNORMAL LOW (ref 4.22–5.81)
RDW: 16.3 % — ABNORMAL HIGH (ref 11.5–15.5)
WBC: 7.8 10*3/uL (ref 4.0–10.5)
nRBC: 0 % (ref 0.0–0.2)

## 2020-09-23 LAB — BASIC METABOLIC PANEL
Anion gap: 22 — ABNORMAL HIGH (ref 5–15)
BUN: 28 mg/dL — ABNORMAL HIGH (ref 6–20)
CO2: 27 mmol/L (ref 22–32)
Calcium: 9.5 mg/dL (ref 8.9–10.3)
Chloride: 91 mmol/L — ABNORMAL LOW (ref 98–111)
Creatinine, Ser: 13.77 mg/dL — ABNORMAL HIGH (ref 0.61–1.24)
GFR, Estimated: 4 mL/min — ABNORMAL LOW (ref >60)
Glucose, Bld: 115 mg/dL — ABNORMAL HIGH (ref 70–99)
Potassium: 3.7 mmol/L (ref 3.5–5.1)
Sodium: 140 mmol/L (ref 135–145)

## 2020-09-23 NOTE — ED Triage Notes (Signed)
Pt arrives via [facility] from home with c/o abd pain, last dialysis was Monday, supposed to go today. Seen here on 12/16 for similar symptoms and diagnosed with UTI, which he did not get his antibiotics for.

## 2020-09-23 NOTE — ED Notes (Signed)
Pt spoke to tech at last vital check about thinking about leaving. Pt called 3x w/o response.

## 2020-09-23 NOTE — ED Notes (Signed)
Called pt x2 for vitals, no response. °

## 2020-09-25 DIAGNOSIS — Z992 Dependence on renal dialysis: Secondary | ICD-10-CM | POA: Diagnosis not present

## 2020-09-25 DIAGNOSIS — N186 End stage renal disease: Secondary | ICD-10-CM | POA: Diagnosis not present

## 2020-09-25 DIAGNOSIS — N2581 Secondary hyperparathyroidism of renal origin: Secondary | ICD-10-CM | POA: Diagnosis not present

## 2020-09-27 ENCOUNTER — Emergency Department (HOSPITAL_COMMUNITY)
Admission: EM | Admit: 2020-09-27 | Discharge: 2020-09-27 | Disposition: A | Discharge status: Home / Self Care | Payer: Medicare PPO | Attending: Emergency Medicine | Admitting: Emergency Medicine

## 2020-09-27 ENCOUNTER — Encounter (HOSPITAL_COMMUNITY): Payer: Self-pay

## 2020-09-27 ENCOUNTER — Emergency Department (HOSPITAL_COMMUNITY): Payer: Medicare PPO

## 2020-09-27 ENCOUNTER — Other Ambulatory Visit: Payer: Self-pay

## 2020-09-27 DIAGNOSIS — R197 Diarrhea, unspecified: Secondary | ICD-10-CM | POA: Insufficient documentation

## 2020-09-27 DIAGNOSIS — I509 Heart failure, unspecified: Secondary | ICD-10-CM | POA: Diagnosis not present

## 2020-09-27 DIAGNOSIS — Z8616 Personal history of COVID-19: Secondary | ICD-10-CM | POA: Diagnosis not present

## 2020-09-27 DIAGNOSIS — R001 Bradycardia, unspecified: Secondary | ICD-10-CM | POA: Diagnosis not present

## 2020-09-27 DIAGNOSIS — Z87891 Personal history of nicotine dependence: Secondary | ICD-10-CM | POA: Diagnosis not present

## 2020-09-27 DIAGNOSIS — N186 End stage renal disease: Secondary | ICD-10-CM | POA: Insufficient documentation

## 2020-09-27 DIAGNOSIS — R0789 Other chest pain: Secondary | ICD-10-CM | POA: Diagnosis not present

## 2020-09-27 DIAGNOSIS — I5032 Chronic diastolic (congestive) heart failure: Secondary | ICD-10-CM | POA: Diagnosis not present

## 2020-09-27 DIAGNOSIS — E1140 Type 2 diabetes mellitus with diabetic neuropathy, unspecified: Secondary | ICD-10-CM | POA: Insufficient documentation

## 2020-09-27 DIAGNOSIS — E1122 Type 2 diabetes mellitus with diabetic chronic kidney disease: Secondary | ICD-10-CM | POA: Diagnosis not present

## 2020-09-27 DIAGNOSIS — E039 Hypothyroidism, unspecified: Secondary | ICD-10-CM | POA: Diagnosis not present

## 2020-09-27 DIAGNOSIS — R079 Chest pain, unspecified: Secondary | ICD-10-CM | POA: Diagnosis not present

## 2020-09-27 DIAGNOSIS — R112 Nausea with vomiting, unspecified: Secondary | ICD-10-CM | POA: Insufficient documentation

## 2020-09-27 DIAGNOSIS — E11319 Type 2 diabetes mellitus with unspecified diabetic retinopathy without macular edema: Secondary | ICD-10-CM | POA: Insufficient documentation

## 2020-09-27 DIAGNOSIS — I132 Hypertensive heart and chronic kidney disease with heart failure and with stage 5 chronic kidney disease, or end stage renal disease: Secondary | ICD-10-CM | POA: Insufficient documentation

## 2020-09-27 DIAGNOSIS — Z79899 Other long term (current) drug therapy: Secondary | ICD-10-CM | POA: Diagnosis not present

## 2020-09-27 DIAGNOSIS — Z992 Dependence on renal dialysis: Secondary | ICD-10-CM | POA: Insufficient documentation

## 2020-09-27 DIAGNOSIS — R0689 Other abnormalities of breathing: Secondary | ICD-10-CM | POA: Diagnosis not present

## 2020-09-27 DIAGNOSIS — R1084 Generalized abdominal pain: Secondary | ICD-10-CM | POA: Diagnosis not present

## 2020-09-27 LAB — BASIC METABOLIC PANEL
Anion gap: 26 — ABNORMAL HIGH (ref 5–15)
BUN: 29 mg/dL — ABNORMAL HIGH (ref 6–20)
CO2: 23 mmol/L (ref 22–32)
Calcium: 9.4 mg/dL (ref 8.9–10.3)
Chloride: 91 mmol/L — ABNORMAL LOW (ref 98–111)
Creatinine, Ser: 14.62 mg/dL — ABNORMAL HIGH (ref 0.61–1.24)
GFR, Estimated: 4 mL/min — ABNORMAL LOW (ref >60)
Glucose, Bld: 97 mg/dL (ref 70–99)
Potassium: 3.2 mmol/L — ABNORMAL LOW (ref 3.5–5.1)
Sodium: 140 mmol/L (ref 135–145)

## 2020-09-27 LAB — CBC
HCT: 32.6 % — ABNORMAL LOW (ref 39.0–52.0)
Hemoglobin: 11.4 g/dL — ABNORMAL LOW (ref 13.0–17.0)
MCH: 36.4 pg — ABNORMAL HIGH (ref 26.0–34.0)
MCHC: 35 g/dL (ref 30.0–36.0)
MCV: 104.2 fL — ABNORMAL HIGH (ref 80.0–100.0)
Platelets: 228 10*3/uL (ref 150–400)
RBC: 3.13 MIL/uL — ABNORMAL LOW (ref 4.22–5.81)
RDW: 15.9 % — ABNORMAL HIGH (ref 11.5–15.5)
WBC: 5.9 10*3/uL (ref 4.0–10.5)
nRBC: 0 % (ref 0.0–0.2)

## 2020-09-27 LAB — HEPATIC FUNCTION PANEL
ALT: 16 U/L (ref 0–44)
AST: 32 U/L (ref 15–41)
Albumin: 4.1 g/dL (ref 3.5–5.0)
Alkaline Phosphatase: 60 U/L (ref 38–126)
Bilirubin, Direct: 0.5 mg/dL — ABNORMAL HIGH (ref 0.0–0.2)
Indirect Bilirubin: 0.9 mg/dL (ref 0.3–0.9)
Total Bilirubin: 1.4 mg/dL — ABNORMAL HIGH (ref 0.3–1.2)
Total Protein: 7.8 g/dL (ref 6.5–8.1)

## 2020-09-27 LAB — TROPONIN I (HIGH SENSITIVITY)
Troponin I (High Sensitivity): 17 ng/L (ref <18)
Troponin I (High Sensitivity): 16 ng/L (ref <18)

## 2020-09-27 LAB — LACTIC ACID, PLASMA
Lactic Acid, Venous: 2.5 mmol/L (ref 0.5–1.9)
Lactic Acid, Venous: 1.7 mmol/L (ref 0.5–1.9)

## 2020-09-27 LAB — LIPASE, BLOOD: Lipase: 65 U/L — ABNORMAL HIGH (ref 11–51)

## 2020-09-27 MED ORDER — HYDROMORPHONE HCL 1 MG/ML IJ SOLN
1.0000 mg | Freq: Once | INTRAMUSCULAR | Status: AC
  Administered 2020-09-27: 1 mg via INTRAVENOUS
  Filled 2020-09-27: qty 1

## 2020-09-27 MED ORDER — OMEPRAZOLE 20 MG PO CPDR: 20.0000 mg | DELAYED_RELEASE_CAPSULE | Freq: Every day | ORAL | 2 refills | Status: DC

## 2020-09-27 MED ORDER — ONDANSETRON HCL 4 MG/2ML IJ SOLN
4.0000 mg | Freq: Once | INTRAMUSCULAR | Status: AC
  Administered 2020-09-27: 4 mg via INTRAVENOUS
  Filled 2020-09-27: qty 2

## 2020-09-27 MED ORDER — SODIUM CHLORIDE 0.9 % IV BOLUS
500.0000 mL | Freq: Once | INTRAVENOUS | Status: AC
  Administered 2020-09-27: 500 mL via INTRAVENOUS

## 2020-09-27 MED ORDER — ACETAMINOPHEN 325 MG PO TABS
650.0000 mg | ORAL_TABLET | Freq: Once | ORAL | Status: AC
  Administered 2020-09-27: 650 mg via ORAL
  Filled 2020-09-27: qty 2

## 2020-09-27 MED ORDER — SODIUM CHLORIDE 0.9 % IV SOLN
1.0000 g | Freq: Once | INTRAVENOUS | Status: AC
  Administered 2020-09-27: 1 g via INTRAVENOUS
  Filled 2020-09-27: qty 10

## 2020-09-27 NOTE — ED Triage Notes (Signed)
Patient arrived by [facility] for ongoing abdominal pain. Family reports that patient diagnosed with UTI and did not get meds filled. Patient yelling and cursing staff. Arrived with NSL in place. Alert and oriented

## 2020-09-27 NOTE — ED Provider Notes (Signed)
[facility] EMERGENCY DEPARTMENT Provider Note   CSN: [id number] Arrival date & time: 09/27/20  0750     History Chief Complaint  Patient presents with  . Abdominal Pain  . Chest Pain    Edward ROG Sr. is a 49 y.o. male.  He is here by ambulance with a complaint of stomach pain that is been going on " while."  He says he has nausea vomiting and diarrhea.  No known fevers.  Does not know if there is blood in the vomit or diarrhea.  He refuses to answer any other questions and says look up in my chart.  He was here on the 16th and had a CAT scan and was treated with antibiotics and given a prescription for antibiotics for discharge.  Per EMS family states he never filled the prescription.  Dialysis Monday Wednesday Friday and last dialysis was Friday.  The history is provided by the patient.  Abdominal Pain Pain location:  Generalized Pain severity:  Severe Onset quality:  Gradual Timing:  Unable to specify Progression:  Unable to specify Context: not trauma   Relieved by:  Nothing Worsened by:  Nothing Ineffective treatments:  None tried Associated symptoms: diarrhea, nausea and vomiting   Associated symptoms: no chest pain, no cough, no dysuria, no fever, no shortness of breath and no sore throat   Chest Pain Associated symptoms: abdominal pain, nausea and vomiting   Associated symptoms: no cough, no fever, no headache and no shortness of breath        Past Medical History:  Diagnosis Date  . Acute on chronic renal failure ([place]) 11/25/2015  . Anemia   . Arthritis   . Blind    Left eye  . Cataract   . CHF (congestive heart failure) ([place])   . Chronic kidney disease    stage 5  . Depression    situatuional  . Diabetes mellitus    Type II  . Graves disease 2014  . Headache    in past  . Hx of adenomatous and sessile serrated colonic polyps 11/21/2019  . Hyperlipidemia   . Hypertension   . Hypothyroidism   . Legally blind    right eye   . Neuropathy   . Non-compliance   . Proteinuria 05/24/2020  . Shortness of breath dyspnea    "Better since I've been taking my medications"    Patient Active Problem List   Diagnosis Date Noted  . ESRD (end stage renal disease) on dialysis ([place])   . Hypertension associated with stage 5 chronic kidney disease due to type 2 diabetes mellitus ([place]) 05/24/2020  . Hyperphosphatemia 05/24/2020  . Proteinuria 05/24/2020  . Legally blind 05/24/2020  . Diabetic retinopathy of both eyes ([place]) 05/24/2020  . Acute metabolic encephalopathy 50/93/2671  . Acute hypoxemic respiratory failure due to COVID-19 ([place]) 05/24/2020  . Pneumonia due to COVID-19 virus 05/23/2020  . Chronic diastolic heart failure ([place]) 10/24/2018  . 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 Mitral regurgitation, Moderate 09/05/2018  . ED (erectile dysfunction) 08/10/2017  . Chronic kidney disease, stage 5 ([place]) 07/18/2017  . Graves disease 03/02/2016  . Non compliance with medical treatment 11/26/2015  . Uncontrolled hypertension 11/26/2015  . Acute on chronic renal failure ([place]) 11/25/2015  . DM (diabetes mellitus), type 2 with ophthalmic complications ([place]) 24/58/0998    Past Surgical History:  Procedure Laterality Date  . AV FISTULA PLACEMENT Right 04/22/2019   Procedure: RIGHT ARM ARTERIOVENOUS (AV) FISTULA CREATION;  Surgeon: Deitra Mayo  S, MD;  Location: [facility];  Service: Vascular;  Laterality: Right;  . CIRCUMCISION     as a child, around 68 years of age  . EYE SURGERY Bilateral    "several "  . PARS PLANA VITRECTOMY Right 03/25/2016   Procedure: PARS PLANA VITRECTOMY 25 GAUGE FOR ENDOPHTHALMITIS;  Surgeon: Jalene Mullet, MD;  Location: [facility];  Service: Ophthalmology;  Laterality: Right;  . WISDOM TOOTH EXTRACTION         Family History  Problem Relation Age of Onset  . Hypertension Mother   . Diabetes Mother   . Bladder Cancer Brother   . Kidney disease Other   . Colon cancer Neg Hx   .  Rectal cancer Neg Hx     Social History   Tobacco Use  . Smoking status: Former Smoker    Packs/day: 0.75    Years: 3.00    Pack years: 2.25    Types: Cigarettes    Quit date: 05/28/2000    Years since quitting: 20.3  . Smokeless tobacco: Never Used  Vaping Use  . Vaping Use: Never used  Substance Use Topics  . Alcohol use: Yes    Alcohol/week: 0.0 standard drinks    Comment: 1 beer  ocassional  . Drug use: No    Home Medications Prior to Admission medications   Medication Sig Start Date End Date Taking? Authorizing Provider  Accu-Chek Softclix Lancets lancets Use as instructed 08/21/20   Philemon Kingdom, MD  acetaminophen (TYLENOL) 500 MG tablet Take 500 mg by mouth every 6 (six) hours as needed.    [provider]  Alcohol Swabs (B-D SINGLE USE SWABS REGULAR) PADS Use as directed. 01/09/20   [name], MD  atorvastatin (LIPITOR) 40 MG tablet Take 1 tablet (40 mg total) by mouth daily. 10/22/19   [name], MD  Blood Glucose Monitoring Suppl (ACCU-CHEK GUIDE ME) w/Device KIT Use 3 times daily before meals 08/21/20   Philemon Kingdom, MD  cephALEXin (KEFLEX) 500 MG capsule Take 1 capsule (500 mg total) by mouth 4 (four) times daily. 09/17/20   Deno Etienne, DO  DECADRON 6 MG tablet Take by mouth daily. 06/03/20   [provider]  diclofenac Sodium (VOLTAREN) 1 % GEL Apply 4 g topically 4 (four) times daily. 09/15/20   [name], MD  Emollient [facility] ADVANCED REPAIR) CREA Apply to dry skin as needed 07/17/20   Gildardo Pounds, NP  gabapentin (NEURONTIN) 300 MG capsule Take 1 capsule (300 mg total) by mouth at bedtime. 09/15/20 12/14/20  [name], MD  glucose blood (ACCU-CHEK GUIDE) test strip Use as instructed 08/21/20   Philemon Kingdom, MD  ketoconazole (NIZORAL) 2 % cream Apply 1 application topically daily. 08/11/20   Criselda Peaches, DPM  levothyroxine (SYNTHROID) 137 MCG tablet TAKE 1 TABLET(137 MCG) BY MOUTH DAILY BEFORE BREAKFAST  08/21/20   Philemon Kingdom, MD  meclizine (ANTIVERT) 25 MG tablet Take 1 tablet (25 mg total) by mouth 3 (three) times daily as needed for dizziness. 09/15/20   [name], MD  metolazone (ZAROXOLYN) 5 MG tablet  04/05/20   [provider]  ondansetron (ZOFRAN ODT) 4 MG disintegrating tablet 6m ODT q4 hours prn nausea/vomit 09/17/20   FDeno Etienne DO  oxyCODONE (ROXICODONE) 5 MG immediate release tablet Take 0.5 tablets (2.5 mg total) by mouth every 4 (four) hours as needed for severe pain. 09/17/20   FDeno Etienne DO  sevelamer carbonate (RENVELA) 800 MG tablet Take 1 tablet (800 mg total)  by mouth 3 (three) times daily with meals. 05/29/20   Daisy Floro, DO  tiZANidine (ZANAFLEX) 4 MG tablet Take 1 tablet (4 mg total) by mouth every 6 (six) hours as needed for muscle spasms. 09/15/20   [name], MD    Allergies    Patient has no known allergies.  Review of Systems   Review of Systems  Constitutional: Negative for fever.  HENT: Negative for sore throat.   Eyes: Negative for pain.  Respiratory: Negative for cough and shortness of breath.   Cardiovascular: Negative for chest pain.  Gastrointestinal: Positive for abdominal pain, diarrhea, nausea and vomiting.  Genitourinary: Negative for dysuria.  Musculoskeletal: Negative for neck pain.  Skin: Negative for rash.  Neurological: Negative for headaches.    Physical Exam Updated Vital Signs BP (!) 161/87 (BP Location: Left Arm)   Pulse (!) 109   Temp 98.7 F (37.1 C) (Oral)   Resp 17   SpO2 100%   Physical Exam Vitals and nursing note reviewed.  Constitutional:      General: He is in acute distress.     Appearance: Normal appearance. He is well-developed and well-nourished.  HENT:     Head: Normocephalic and atraumatic.  Eyes:     Conjunctiva/sclera: Conjunctivae normal.  Cardiovascular:     Rate and Rhythm: Normal rate and regular rhythm.     Heart sounds: No murmur heard.   Pulmonary:      Effort: Pulmonary effort is normal. No respiratory distress.     Breath sounds: Normal breath sounds.  Abdominal:     Palpations: Abdomen is soft.     Tenderness: There is generalized abdominal tenderness. There is no guarding or rebound.  Musculoskeletal:        General: No edema. Normal range of motion.     Cervical back: Neck supple.     Comments: Fistula right upper arm with positive thrill  Skin:    General: Skin is warm and dry.  Neurological:     General: No focal deficit present.     Mental Status: He is alert.  Psychiatric:        Mood and Affect: Mood and affect normal.     ED Results / Procedures / Treatments   Labs (all labs ordered are listed, but only abnormal results are displayed) Labs Reviewed  BASIC METABOLIC PANEL - Abnormal; Notable for the following components:      Result Value   Potassium 3.2 (*)    Chloride 91 (*)    BUN 29 (*)    Creatinine, Ser 14.62 (*)    GFR, Estimated 4 (*)    Anion gap 26 (*)    All other components within normal limits  CBC - Abnormal; Notable for the following components:   RBC 3.13 (*)    Hemoglobin 11.4 (*)    HCT 32.6 (*)    MCV 104.2 (*)    MCH 36.4 (*)    RDW 15.9 (*)    All other components within normal limits  HEPATIC FUNCTION PANEL - Abnormal; Notable for the following components:   Total Bilirubin 1.4 (*)    Bilirubin, Direct 0.5 (*)    All other components within normal limits  LIPASE, BLOOD - Abnormal; Notable for the following components:   Lipase 65 (*)    All other components within normal limits  LACTIC ACID, PLASMA - Abnormal; Notable for the following components:   Lactic Acid, Venous 2.5 (*)    All other components  within normal limits  URINE CULTURE  LACTIC ACID, PLASMA  TROPONIN I (HIGH SENSITIVITY)  TROPONIN I (HIGH SENSITIVITY)    EKG EKG Interpretation  Date/Time:  Sunday September 27 2020 12:31:44 EST Ventricular Rate:  73 PR Interval:  180 QRS Duration: 105 QT Interval:  410 QTC  Calculation: 452 R Axis:   3 Text Interpretation: Sinus rhythm Abnormal T, consider ischemia, lateral leads Confirmed by Aletta Edouard [phone]) on 09/27/2020 12:33:22 PM Also confirmed by Aletta Edouard [phone]), editor Hattie Perch [phone])  on 09/28/2020 6:37:02 AM   Radiology DG Chest 2 View  Result Date: 09/27/2020 CLINICAL DATA:  Chest pain EXAM: CHEST - 2 VIEW COMPARISON:  09/17/2020 FINDINGS: Cardiac shadow is within normal limits. The lungs are clear bilaterally. No bony abnormality is noted. IMPRESSION: No acute abnormality noted. Electronically Signed   By: Inez Catalina M.D.   On: 09/27/2020 09:09    Procedures Procedures (including critical care time)  Medications Ordered in ED Medications  acetaminophen (TYLENOL) tablet 650 mg (650 mg Oral Given 09/27/20 1158)  sodium chloride 0.9 % bolus 500 mL (0 mLs Intravenous Stopped 09/27/20 1333)  ondansetron (ZOFRAN) injection 4 mg (4 mg Intravenous Given 09/27/20 1201)  HYDROmorphone (DILAUDID) injection 1 mg (1 mg Intravenous Given 09/27/20 1203)  cefTRIAXone (ROCEPHIN) 1 g in sodium chloride 0.9 % 100 mL IVPB (0 g Intravenous Stopped 09/27/20 1553)    ED Course  I have reviewed the triage vital signs and the nursing notes.  Pertinent labs & imaging results that were available during my care of the patient were reviewed by me and considered in my medical decision making (see chart for details).  Clinical Course as of 09/28/20 0847  Sun Sep 27, 2020  1241 Reassessed patient after medications given.  He said his pain is improved.  We reviewed his last ED visit.  He said they did discharge him with antibiotics for possible urine infection.  He says he makes a little bit of urine.  He did not take the antibiotics initially but has taken them for the last few days.  Did not take them today. [MB]  9622 Patient's lactate is cleared.  His lipase is mildly elevated.  He has been sleeping comfortably.  Gave him an IV dose of  ceftriaxone for possible urine infection although patient has not produced any urine.  He says he only makes a tiny bit. [MB]  1550 Patient states his pain is improved but he is frustrated why this is still going on.  I asked him about marijuana and he said he does smoke but he was having the pain before he started using marijuana.  I recommended GI follow-up will place a referral for him. [MB]    Clinical Course User Index [MB] Hayden Rasmussen, MD   MDM Rules/Calculators/A&P                         This patient complains of abdominal pain nausea vomiting diarrhea; this involves an extensive number of treatment Options and is a complaint that carries with it a high risk of complications and Morbidity. The differential includes intra-abdominal infection, cholecystitis, cholelithiasis, peptic ulcer disease, obstruction, cannabis hyperemesis syndrome  I ordered, reviewed and interpreted labs, which included CBC with normal white count, low hemoglobin stable from priors, chemistries mildly low potassium and elevated creatinine consistent with his end-stage renal disease, LFTs fairly normal other than mild elevation of bilirubin.  Urinalysis ordered but patient unable to provide  a sample, troponins flat, lactic acid not elevated, lipase mildly elevated I ordered medication IV fluids nausea and pain medication with improvement in patient's symptoms.  Empiric IV antibiotics for possible UTI I ordered imaging studies which included chest x-ray and I independently    visualized and interpreted imaging which showed no acute infiltrates, no free air Previous records obtained and reviewed in epic including recent ED visit and CAT scan results  After the interventions stated above, I reevaluated the patient and found patient's symptoms to have improved.  We discussed a plan of trial of PPI and GI referral and he is comfortable with that.  Return instructions discussed.   Final Clinical Impression(s) / ED  Diagnoses Final diagnoses:  Generalized abdominal pain  Nausea vomiting and diarrhea  ESRD (end stage renal disease) ([place])    Rx / DC Orders ED Discharge Orders         Ordered    omeprazole (PRILOSEC) 20 MG capsule  Daily        09/27/20 1552    Ambulatory referral to Gastroenterology        09/27/20 1553           Hayden Rasmussen, MD 09/28/20 [phone]

## 2020-09-27 NOTE — Discharge Instructions (Addendum)
You were seen in the emergency department for bad abdominal pain along with nausea vomiting diarrhea.  Your lab work did not show an obvious explanation for your symptoms.  You were given a dose of IV antibiotics along with fluid and pain medicine.  Please finish the prescription for antibiotics that you were given last week.  We are also putting you on some acid medication for your stomach.  Return to the emergency department if any worsening or concerning symptoms.  We also put a referral into gastroenterology for further evaluation of your abdominal symptoms.

## 2020-09-28 DIAGNOSIS — J9621 Acute and chronic respiratory failure with hypoxia: Secondary | ICD-10-CM | POA: Diagnosis not present

## 2020-09-28 DIAGNOSIS — U071 COVID-19: Secondary | ICD-10-CM | POA: Diagnosis not present

## 2020-09-28 DIAGNOSIS — N186 End stage renal disease: Secondary | ICD-10-CM | POA: Diagnosis not present

## 2020-09-28 DIAGNOSIS — Z992 Dependence on renal dialysis: Secondary | ICD-10-CM | POA: Diagnosis not present

## 2020-09-28 DIAGNOSIS — N2581 Secondary hyperparathyroidism of renal origin: Secondary | ICD-10-CM | POA: Diagnosis not present

## 2020-09-30 DIAGNOSIS — N186 End stage renal disease: Secondary | ICD-10-CM | POA: Diagnosis not present

## 2020-09-30 DIAGNOSIS — Z992 Dependence on renal dialysis: Secondary | ICD-10-CM | POA: Diagnosis not present

## 2020-09-30 DIAGNOSIS — N2581 Secondary hyperparathyroidism of renal origin: Secondary | ICD-10-CM | POA: Diagnosis not present

## 2020-10-02 DIAGNOSIS — Z992 Dependence on renal dialysis: Secondary | ICD-10-CM | POA: Diagnosis not present

## 2020-10-02 DIAGNOSIS — E1129 Type 2 diabetes mellitus with other diabetic kidney complication: Secondary | ICD-10-CM | POA: Diagnosis not present

## 2020-10-02 DIAGNOSIS — N2581 Secondary hyperparathyroidism of renal origin: Secondary | ICD-10-CM | POA: Diagnosis not present

## 2020-10-02 DIAGNOSIS — N186 End stage renal disease: Secondary | ICD-10-CM | POA: Diagnosis not present

## 2020-10-07 DIAGNOSIS — N2581 Secondary hyperparathyroidism of renal origin: Secondary | ICD-10-CM | POA: Diagnosis not present

## 2020-10-07 DIAGNOSIS — Z992 Dependence on renal dialysis: Secondary | ICD-10-CM | POA: Diagnosis not present

## 2020-10-07 DIAGNOSIS — N186 End stage renal disease: Secondary | ICD-10-CM | POA: Diagnosis not present

## 2020-10-08 ENCOUNTER — Other Ambulatory Visit: Payer: Self-pay

## 2020-10-08 ENCOUNTER — Ambulatory Visit: Payer: Medicare HMO | Attending: Family Medicine | Admitting: Physical Therapy

## 2020-10-08 ENCOUNTER — Encounter: Payer: Self-pay | Admitting: Physical Therapy

## 2020-10-08 DIAGNOSIS — M545 Low back pain, unspecified: Secondary | ICD-10-CM | POA: Diagnosis not present

## 2020-10-08 DIAGNOSIS — M25571 Pain in right ankle and joints of right foot: Secondary | ICD-10-CM | POA: Diagnosis not present

## 2020-10-08 DIAGNOSIS — M6281 Muscle weakness (generalized): Secondary | ICD-10-CM | POA: Insufficient documentation

## 2020-10-08 DIAGNOSIS — M25561 Pain in right knee: Secondary | ICD-10-CM | POA: Diagnosis not present

## 2020-10-08 DIAGNOSIS — G8929 Other chronic pain: Secondary | ICD-10-CM

## 2020-10-08 DIAGNOSIS — M25562 Pain in left knee: Secondary | ICD-10-CM | POA: Diagnosis not present

## 2020-10-08 DIAGNOSIS — M25572 Pain in left ankle and joints of left foot: Secondary | ICD-10-CM | POA: Diagnosis not present

## 2020-10-08 DIAGNOSIS — R2689 Other abnormalities of gait and mobility: Secondary | ICD-10-CM | POA: Diagnosis not present

## 2020-10-08 NOTE — Therapy (Signed)
[name], [place], [address] Phone: [phone]   Fax:  [phone]  Physical Therapy Evaluation  Patient Details  Name: Edward [name] Sr. MRN: [id number] Date of Birth: [birth year] Referring Provider (PT): [name], MD   Encounter Date: 10/08/2020   PT End of Session - 10/08/20 1502    Visit Number 1    Number of Visits 6    Date for PT Re-Evaluation 11/19/20    Authorization Type [employer] MEDICARE    Progress Note Due on Visit 10    PT Start Time 1450    PT Stop Time 1530    PT Time Calculation (min) 40 min    Activity Tolerance Patient tolerated treatment well    Behavior During Therapy [facility] for tasks assessed/performed           Past Medical History:  Diagnosis Date  . Acute on chronic renal failure ([place]) 11/25/2015  . Anemia   . Arthritis   . Blind    Left eye  . Cataract   . CHF (congestive heart failure) ([place])   . Chronic kidney disease    stage 5  . Depression    situatuional  . Diabetes mellitus    Type II  . Graves disease 2014  . Headache    in past  . Hx of adenomatous and sessile serrated colonic polyps 11/21/2019  . Hyperlipidemia   . Hypertension   . Hypothyroidism   . Legally blind    right eye  . Neuropathy   . Non-compliance   . Proteinuria 05/24/2020  . Shortness of breath dyspnea    "Better since I've been taking my medications"    Past Surgical History:  Procedure Laterality Date  . AV FISTULA PLACEMENT Right 04/22/2019   Procedure: RIGHT ARM ARTERIOVENOUS (AV) FISTULA CREATION;  Surgeon: Angelia Mould, MD;  Location: [facility];  Service: Vascular;  Laterality: Right;  . CIRCUMCISION     as a child, around 50 years of age  . EYE SURGERY Bilateral    "several "  . PARS PLANA VITRECTOMY Right 03/25/2016   Procedure: PARS PLANA VITRECTOMY 25 GAUGE FOR ENDOPHTHALMITIS;  Surgeon: Jalene Mullet, MD;  Location: [facility];  Service: Ophthalmology;   Laterality: Right;  . WISDOM TOOTH EXTRACTION      There were no vitals filed for this visit.    Subjective Assessment - 10/08/20 1452    Subjective Patient reports he thinks he was sent in because his ankles, knees, and lower back. Patient reports he has a couple deteriating discs and low back pain for many many years. Pain will flare up every now and then causing his legs to bother him. Knees and ankles bother him all the time. Patient reports he has trouble standing for a while, walking, standing, getting up from a chair, and sometimes his knee buckle on him.    Limitations Sitting;Lifting;Standing;Walking;House hold activities    How long can you sit comfortably? 5 minutes    How long can you stand comfortably? 10 minutes    How long can you walk comfortably? 10 minutes    Diagnostic tests MRI, X-ray    Patient Stated Goals Get feeling better and moving better    Currently in Pain? Yes    Pain Score 7     Pain Location Back    Pain Orientation Lower    Pain Descriptors / Indicators Aching;Burning;Throbbing;Tightness;Sharp    Pain Type Chronic pain    Pain  Radiating Towards occasionally causes bilateral leg pain    Pain Onset More than a month ago    Pain Frequency Constant    Aggravating Factors  Bending, standing, walking, sittting, twisting    Pain Relieving Factors Rest    Multiple Pain Sites Yes    Pain Score 5    Pain Location Knee    Pain Orientation Right;Left    Pain Descriptors / Indicators Aching;Burning;Throbbing;Tightness;Sharp    Pain Type Chronic pain    Pain Onset More than a month ago    Pain Frequency Constant    Aggravating Factors  Walking, standing    Pain Relieving Factors Rest    Pain Score 5    Pain Location Ankle    Pain Orientation Right;Left    Pain Descriptors / Indicators Aching;Tightness;Sharp;Burning;Throbbing    Pain Onset More than a month ago    Pain Frequency Constant    Aggravating Factors  Walking, standing    Pain Relieving Factors  Rest              [facility] PT Assessment - 10/08/20 0001      Assessment   Medical Diagnosis Arthralgia    Referring Provider (PT) [name], MD    Onset Date/Surgical Date --   patient reports pain for many years   Next MD Visit 11/12/2020   appointment with podiatrist   Prior Therapy None      Precautions   Precautions None      Restrictions   Weight Bearing Restrictions No      Balance Screen   Has the patient fallen in the past 6 months Yes    How many times? Tripped over something recently    Has the patient had a decrease in activity level because of a fear of falling?  Yes    Is the patient reluctant to leave their home because of a fear of falling?  Yes      [place] residence    Living Arrangements Alone    Type of [place] to enter    Entrance Stairs-Number of Steps 5    Entrance Stairs-Rails Right;Left;Can reach both    [place] One level      Prior Function   Level of Independence Independent    Vocation Part time employment    Vocation Requirements [employer]   Overall Cognitive Status Within Functional Limits for tasks assessed      Observation/Other Assessments   Observations Patient appears in no apparent distress    Focus on Therapeutic Outcomes (FOTO)  43% functional status      Sensation   Light Touch Appears Intact      Coordination   Gross Motor Movements are Fluid and Coordinated Yes      Functional Tests   Functional tests Sit to Stand      Sit to Stand   Comments Patient tends to use BUE assist, able to perform without UE support but with decreased speed and greater discomfort      Posture/Postural Control   Posture Comments Patient exhibits generally slouched seated posture      ROM / Strength   AROM / PROM / Strength AROM;Strength      AROM   Overall AROM Comments Bilateral knee and ankle AROM grossly WFL    AROM Assessment Site Lumbar;Knee;Ankle     Lumbar Flexion WFL   hamstring tightness noted  Lumbar Extension 75%   pain across low back   Lumbar - Right Side Bend WFL   pain across low back   Lumbar - Left Side Bend WFL   pain across low back   Lumbar - Right Rotation 75%   pain across low back   Lumbar - Left Rotation 75%   pain across low back     Strength   Strength Assessment Site Hip;Knee;Ankle    Right/Left Hip Right;Left    Right Hip Flexion 4-/5    Right Hip Extension 3+/5    Right Hip ABduction 3+/5    Left Hip Flexion 4-/5    Left Hip Extension 4-/5    Left Hip ABduction 4-/5    Right/Left Knee Right;Left    Right Knee Flexion 4+/5    Right Knee Extension 4/5    Left Knee Flexion 4+/5    Left Knee Extension 4+/5    Right/Left Ankle Right;Left    Right Ankle Dorsiflexion 5/5    Right Ankle Plantar Flexion 4/5    Left Ankle Dorsiflexion 5/5    Left Ankle Plantar Flexion 4+/5      Flexibility   Soft Tissue Assessment /Muscle Length yes    Hamstrings Limited bilaterally    Quadriceps Limited bilaterally    Piriformis Limited bilaterally      Palpation   Spinal mobility Not assessed    Palpation comment Not assessed      Special Tests   Other special tests Lumbar radicular testing negative for LE pain      Transfers   Transfers Independent with all Transfers      Ambulation/Gait   Ambulation/Gait Yes    Ambulation/Gait Assistance 7: Independent    Gait Comments Generally decreased gait speed                      Objective measurements completed on examination: See above findings.       [place] Adult PT Treatment/Exercise - 10/08/20 0001      Exercises   Exercises Lumbar      Lumbar Exercises: Stretches   Passive Hamstring Stretch 20 seconds    Passive Hamstring Stretch Limitations seated edge of chair    Lower Trunk Rotation 2 reps;10 seconds    Hip Flexor Stretch 20 seconds    Hip Flexor Stretch Limitations thomas edge if tabel    Piriformis Stretch 20 seconds      Lumbar  Exercises: Supine   Bridge 5 reps    Straight Leg Raise 5 reps      Lumbar Exercises: Sidelying   Clam 10 reps                  PT Education - 10/08/20 1501    Education Details Exam findings, POC, HEP    Person(s) Educated Patient    Methods Explanation;Demonstration;Handout;Verbal cues    Comprehension Verbalized understanding;Need further instruction;Returned demonstration;Verbal cues required            PT Short Term Goals - 10/08/20 1622      PT SHORT TERM GOAL #1   Title Patient will be I with initial HEP to progress with PT    Time 3    Period Weeks    Status New    Target Date 10/29/20      PT SHORT TERM GOAL #2   Title PT will review FOTO with patient by 3rd visit    Time 3    Period Weeks  Status New    Target Date 10/29/20             PT Long Term Goals - 10/08/20 1623      PT LONG TERM GOAL #1   Title Patient will be I with final HEP to maintain progress from PT    Time 6    Period Weeks    Status New    Target Date 11/19/20      PT LONG TERM GOAL #2   Title Patient will demonstrate improved hip strength >/= 4/5 MMT and knee and ankle strength = 5/5 MMT to improve walking tolerance    Time 6    Period Weeks    Status New    Target Date 11/19/20      PT LONG TERM GOAL #3   Title Patient will be able to sit and walk >/= 30 minutes without need for change of position    Time 6    Period Weeks    Status New    Target Date 11/19/20      PT LONG TERM GOAL #4   Title Patient will report </= 4/10 pain level with activity to improve mobility and activity level    Time 6    Period Weeks    Status New    Target Date 11/19/20      PT LONG TERM GOAL #5   Title Patient will report improved functional status >/= 54% functional status on FOTO    Time 6    Period Weeks    Status New    Target Date 11/19/20                  Plan - 10/08/20 1506    Clinical Impression Statement Patient presents to PT with report of chronic  lower back, bilateral knee, and ankle pain for many years with no apparent mechanism of injury. Patient was hospitalized wth COVID a few months ago and likely continues to be deconditioned from that episode which has worsened symptoms. He does report radicular pain when he has exacerbation of back pain, but did not report any clear radicular symptoms with testing this visit. He exhibits limitation and pain with lumbar extension and rotation, flexibility deficit throughout hips, hamstring, and quads with increased pain pain, strength impairment of core, hips and general BLE which is worse on right. Patient was provided exercises to initiate stretching and strengthening program, and he would benefit from continued skilled PT to progress his mobility and strength to reduce pain and maximize functional ability.    Personal Factors and Comorbidities Fitness;Past/Current Experience;Social Background;Comorbidity 3+    Comorbidities Graves Disease, DM II, HTN, renal failure    Examination-Activity Limitations Locomotion Level;Sit;Squat;Stairs;Stand;Lift;Bend    Examination-Participation Restrictions Meal Prep;Cleaning;Community Activity;Yard Work    [employer] Evolving/Moderate complexity    Clinical Decision Making Moderate    Rehab Potential Fair    PT Frequency 1x / week    PT Duration 6 weeks    PT Treatment/Interventions ADLs/Self Care Home Management;Cryotherapy;Electrical Stimulation;Iontophoresis 4mg /ml Dexamethasone;Moist Heat;Traction;Ultrasound;Neuromuscular re-education;Balance training;Therapeutic exercise;Therapeutic activities;Functional mobility training;Stair training;Gait training;Patient/family education;Manual techniques;Dry needling;Passive range of motion;Taping;Spinal Manipulations;Joint Manipulations;Vasopneumatic Device    PT Next Visit Plan Review HEP and progress PRN, manual/stretching for low back/hips/hamstrings/quads, progress core and hip strengthening,  general BLE strengthening progress to closed chain as able    PT Home Exercise Plan [email or web address]    Consulted and Agree with Plan of Care Patient  Patient will benefit from skilled therapeutic intervention in order to improve the following deficits and impairments:  Decreased range of motion,Postural dysfunction,Decreased strength,Pain,Decreased activity tolerance,Impaired flexibility  Visit Diagnosis: Chronic bilateral low back pain, unspecified whether sciatica present  Chronic pain of left knee  Chronic pain of right knee  Pain in left ankle and joints of left foot  Pain in right ankle and joints of right foot  Muscle weakness (generalized)  Other abnormalities of gait and mobility     Problem List Patient Active Problem List   Diagnosis Date Noted  . ESRD (end stage renal disease) on dialysis ([place])   . Hypertension associated with stage 5 chronic kidney disease due to type 2 diabetes mellitus ([place]) 05/24/2020  . Hyperphosphatemia 05/24/2020  . Proteinuria 05/24/2020  . Legally blind 05/24/2020  . Diabetic retinopathy of both eyes ([place]) 05/24/2020  . Acute metabolic encephalopathy 68/34/1962  . Acute hypoxemic respiratory failure due to COVID-19 ([place]) 05/24/2020  . Pneumonia due to COVID-19 virus 05/23/2020  . Chronic diastolic heart failure ([place]) 10/24/2018  . 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 Mitral regurgitation, Moderate 09/05/2018  . ED (erectile dysfunction) 08/10/2017  . Chronic kidney disease, stage 5 ([place]) 07/18/2017  . Graves disease 03/02/2016  . Non compliance with medical treatment 11/26/2015  . Uncontrolled hypertension 11/26/2015  . Acute on chronic renal failure ([place]) 11/25/2015  . DM (diabetes mellitus), type 2 with ophthalmic complications ([place]) 22/97/9892    Hilda Blades, PT, DPT, LAT, ATC 10/08/20  4:43 PM Phone: [phone] Fax: [facility] [address] [place], [place], [address] Phone: [phone]   Fax:  [phone]  Name: Edward [name] Sr. MRN: [id number] Date of Birth: [birth year]   Referring diagnosis? M25.50 Treatment diagnosis? (if different than referring diagnosis): M54.50 What was this (referring dx) caused by? []  Surgery []  Fall [x]  Ongoing issue []  Arthritis []  Other: ____________  Laterality: []  Rt []  Lt [x]  Both  Check all possible CPT codes:      [x]  97110 (Therapeutic Exercise)  []  92507 (SLP Treatment)  [x]  97112 (Neuro Re-ed)   []  92526 (Swallowing Treatment)   [x]  97116 (Gait Training)   []  [id number] (Cognitive Training, 1st 15 minutes) [x]  97140 (Manual Therapy)   []  97130 (Cognitive Training, each add'l 15 minutes)  [x]  97530 (Therapeutic Activities)  []  Other, List CPT Code ____________    [x]  67209 (Self Care)       [x]  All codes above (97110 - 97535)  [x]  47096 (Mechanical Traction)  [x]  97014 (E-stim Unattended)  []  97032 (E-stim manual)  [x]  97033 (Ionto)  [x]  97035 (Ultrasound)  []  97760 (Orthotic Fit) []  97750 (Physical Performance Training) []  [id number] (Aquatic Therapy) []  97034 (Contrast Bath) []  [id number] (Paraffin) []  97597 (Wound Care 1st 20 sq cm) []  97598 (Wound Care each add'l 20 sq cm) []  97016 (Vasopneumatic Device) []  [id number] Comptroller) []  [id number] (Prosthetic Training)

## 2020-10-08 NOTE — Patient Instructions (Signed)
Access Code: [email or web address] URL: [email or web address] Date: 10/08/2020 Prepared by: Hilda Blades  Exercises Supine Lower Trunk Rotation - 2 x daily - 7 x weekly - 5 reps - 10 seconds hold Supine Piriformis Stretch with Foot on Ground - 2 x daily - 7 x weekly - 2 reps - 30 seconds hold Thomas Stretch on Table - 2 x daily - 7 x weekly - 2 reps - 30 seconds hold Seated Hamstring Stretch - 2 x daily - 7 x weekly - 2 reps - 30 seconds hold Supine Bridge - 1 x daily - 7 x weekly - 3 sets - 5 reps Clamshell - 1 x daily - 7 x weekly - 2 sets - 10 reps Supine Active Straight Leg Raise - 1 x daily - 7 x weekly - 2 sets - 10 reps

## 2020-10-09 DIAGNOSIS — Z992 Dependence on renal dialysis: Secondary | ICD-10-CM | POA: Diagnosis not present

## 2020-10-09 DIAGNOSIS — N2581 Secondary hyperparathyroidism of renal origin: Secondary | ICD-10-CM | POA: Diagnosis not present

## 2020-10-09 DIAGNOSIS — N186 End stage renal disease: Secondary | ICD-10-CM | POA: Diagnosis not present

## 2020-10-12 DIAGNOSIS — N186 End stage renal disease: Secondary | ICD-10-CM | POA: Diagnosis not present

## 2020-10-12 DIAGNOSIS — N2581 Secondary hyperparathyroidism of renal origin: Secondary | ICD-10-CM | POA: Diagnosis not present

## 2020-10-12 DIAGNOSIS — Z992 Dependence on renal dialysis: Secondary | ICD-10-CM | POA: Diagnosis not present

## 2020-10-14 DIAGNOSIS — N186 End stage renal disease: Secondary | ICD-10-CM | POA: Diagnosis not present

## 2020-10-14 DIAGNOSIS — N2581 Secondary hyperparathyroidism of renal origin: Secondary | ICD-10-CM | POA: Diagnosis not present

## 2020-10-14 DIAGNOSIS — Z992 Dependence on renal dialysis: Secondary | ICD-10-CM | POA: Diagnosis not present

## 2020-10-16 DIAGNOSIS — N2581 Secondary hyperparathyroidism of renal origin: Secondary | ICD-10-CM | POA: Diagnosis not present

## 2020-10-16 DIAGNOSIS — N186 End stage renal disease: Secondary | ICD-10-CM | POA: Diagnosis not present

## 2020-10-16 DIAGNOSIS — Z992 Dependence on renal dialysis: Secondary | ICD-10-CM | POA: Diagnosis not present

## 2020-10-21 DIAGNOSIS — Z992 Dependence on renal dialysis: Secondary | ICD-10-CM | POA: Diagnosis not present

## 2020-10-21 DIAGNOSIS — N186 End stage renal disease: Secondary | ICD-10-CM | POA: Diagnosis not present

## 2020-10-21 DIAGNOSIS — N2581 Secondary hyperparathyroidism of renal origin: Secondary | ICD-10-CM | POA: Diagnosis not present

## 2020-10-23 DIAGNOSIS — N2581 Secondary hyperparathyroidism of renal origin: Secondary | ICD-10-CM | POA: Diagnosis not present

## 2020-10-23 DIAGNOSIS — N186 End stage renal disease: Secondary | ICD-10-CM | POA: Diagnosis not present

## 2020-10-23 DIAGNOSIS — Z992 Dependence on renal dialysis: Secondary | ICD-10-CM | POA: Diagnosis not present

## 2020-10-26 DIAGNOSIS — N186 End stage renal disease: Secondary | ICD-10-CM | POA: Diagnosis not present

## 2020-10-26 DIAGNOSIS — Z992 Dependence on renal dialysis: Secondary | ICD-10-CM | POA: Diagnosis not present

## 2020-10-26 DIAGNOSIS — N2581 Secondary hyperparathyroidism of renal origin: Secondary | ICD-10-CM | POA: Diagnosis not present

## 2020-10-28 DIAGNOSIS — N186 End stage renal disease: Secondary | ICD-10-CM | POA: Diagnosis not present

## 2020-10-28 DIAGNOSIS — Z992 Dependence on renal dialysis: Secondary | ICD-10-CM | POA: Diagnosis not present

## 2020-10-28 DIAGNOSIS — N2581 Secondary hyperparathyroidism of renal origin: Secondary | ICD-10-CM | POA: Diagnosis not present

## 2020-10-29 ENCOUNTER — Ambulatory Visit: Payer: Medicare HMO

## 2020-10-29 ENCOUNTER — Telehealth: Payer: Self-pay

## 2020-10-29 DIAGNOSIS — J9621 Acute and chronic respiratory failure with hypoxia: Secondary | ICD-10-CM | POA: Diagnosis not present

## 2020-10-29 DIAGNOSIS — U071 COVID-19: Secondary | ICD-10-CM | POA: Diagnosis not present

## 2020-10-29 NOTE — Telephone Encounter (Signed)
Spoke with patient regarding no show today and attendance policy. Pt reports he forgot about his appointment and was even planning to call then didn't. Confirmed patient's next appointment with him and asked that he call if he is unable to make it.  Haydee Monica, PT, DPT 10/29/20 3:11 PM

## 2020-10-30 DIAGNOSIS — N186 End stage renal disease: Secondary | ICD-10-CM | POA: Diagnosis not present

## 2020-10-30 DIAGNOSIS — N2581 Secondary hyperparathyroidism of renal origin: Secondary | ICD-10-CM | POA: Diagnosis not present

## 2020-10-30 DIAGNOSIS — Z992 Dependence on renal dialysis: Secondary | ICD-10-CM | POA: Diagnosis not present

## 2020-11-02 DIAGNOSIS — Z992 Dependence on renal dialysis: Secondary | ICD-10-CM | POA: Diagnosis not present

## 2020-11-02 DIAGNOSIS — E1129 Type 2 diabetes mellitus with other diabetic kidney complication: Secondary | ICD-10-CM | POA: Diagnosis not present

## 2020-11-02 DIAGNOSIS — N186 End stage renal disease: Secondary | ICD-10-CM | POA: Diagnosis not present

## 2020-11-02 DIAGNOSIS — N2581 Secondary hyperparathyroidism of renal origin: Secondary | ICD-10-CM | POA: Diagnosis not present

## 2020-11-04 DIAGNOSIS — N186 End stage renal disease: Secondary | ICD-10-CM | POA: Diagnosis not present

## 2020-11-04 DIAGNOSIS — N2581 Secondary hyperparathyroidism of renal origin: Secondary | ICD-10-CM | POA: Diagnosis not present

## 2020-11-04 DIAGNOSIS — Z992 Dependence on renal dialysis: Secondary | ICD-10-CM | POA: Diagnosis not present

## 2020-11-05 ENCOUNTER — Ambulatory Visit: Payer: Medicare HMO | Attending: Family Medicine | Admitting: Physical Therapy

## 2020-11-05 ENCOUNTER — Encounter: Payer: Self-pay | Admitting: Physical Therapy

## 2020-11-05 ENCOUNTER — Other Ambulatory Visit: Payer: Self-pay

## 2020-11-05 DIAGNOSIS — M25561 Pain in right knee: Secondary | ICD-10-CM | POA: Insufficient documentation

## 2020-11-05 DIAGNOSIS — M25572 Pain in left ankle and joints of left foot: Secondary | ICD-10-CM | POA: Diagnosis not present

## 2020-11-05 DIAGNOSIS — M25571 Pain in right ankle and joints of right foot: Secondary | ICD-10-CM | POA: Insufficient documentation

## 2020-11-05 DIAGNOSIS — M25562 Pain in left knee: Secondary | ICD-10-CM | POA: Insufficient documentation

## 2020-11-05 DIAGNOSIS — R2689 Other abnormalities of gait and mobility: Secondary | ICD-10-CM | POA: Diagnosis not present

## 2020-11-05 DIAGNOSIS — G8929 Other chronic pain: Secondary | ICD-10-CM | POA: Diagnosis not present

## 2020-11-05 DIAGNOSIS — M6281 Muscle weakness (generalized): Secondary | ICD-10-CM

## 2020-11-05 DIAGNOSIS — M545 Low back pain, unspecified: Secondary | ICD-10-CM | POA: Diagnosis not present

## 2020-11-05 NOTE — Patient Instructions (Signed)
Access Code: [email or web address] URL: [email or web address] Date: 11/05/2020 Prepared by: Hilda Blades  Exercises Supine Lower Trunk Rotation - 2 x daily - 7 x weekly - 5 reps - 10 seconds hold Supine Piriformis Stretch with Foot on Ground - 2 x daily - 7 x weekly - 2 reps - 30 seconds hold Sidelying Thoracic Rotation - 2 x daily - 7 x weekly - 5 reps - 5 seconds hold Thomas Stretch on Table - 2 x daily - 7 x weekly - 2 reps - 30 seconds hold Seated Hamstring Stretch - 2 x daily - 7 x weekly - 2 reps - 30 seconds hold Supine Bridge - 1 x daily - 7 x weekly - 3 sets - 5 reps Supine Active Straight Leg Raise - 1 x daily - 7 x weekly - 2 sets - 10 reps Step Back Shoulder Stretch with Chair - 2 x daily - 7 x weekly - 5 reps - 10 seconds hold

## 2020-11-05 NOTE — Therapy (Signed)
[place], [place], [address] Phone: [phone]   Fax:  [phone]  Physical Therapy Treatment  Patient Details  Name: Edward CORDREY Sr. MRN: [id number] Date of Birth: [birth year] Referring Provider (PT): [name], MD   Encounter Date: 11/05/2020   PT End of Session - 11/05/20 1611    Visit Number 2    Number of Visits 6    Date for PT Re-Evaluation 11/19/20    Authorization Type [employer] MEDICARE    Progress Note Due on Visit 10    PT Start Time 1615    PT Stop Time 1655    PT Time Calculation (min) 40 min    Activity Tolerance Patient tolerated treatment well    Behavior During Therapy [facility] for tasks assessed/performed           Past Medical History:  Diagnosis Date  . Acute on chronic renal failure ([place]) 11/25/2015  . Anemia   . Arthritis   . Blind    Left eye  . Cataract   . CHF (congestive heart failure) ([place])   . Chronic kidney disease    stage 5  . Depression    situatuional  . Diabetes mellitus    Type II  . Graves disease 2014  . Headache    in past  . Hx of adenomatous and sessile serrated colonic polyps 11/21/2019  . Hyperlipidemia   . Hypertension   . Hypothyroidism   . Legally blind    right eye  . Neuropathy   . Non-compliance   . Proteinuria 05/24/2020  . Shortness of breath dyspnea    "Better since I've been taking my medications"    Past Surgical History:  Procedure Laterality Date  . AV FISTULA PLACEMENT Right 04/22/2019   Procedure: RIGHT ARM ARTERIOVENOUS (AV) FISTULA CREATION;  Surgeon: Angelia Mould, MD;  Location: [facility];  Service: Vascular;  Laterality: Right;  . CIRCUMCISION     as a child, around 50 years of age  . EYE SURGERY Bilateral    "several "  . PARS PLANA VITRECTOMY Right 03/25/2016   Procedure: PARS PLANA VITRECTOMY 25 GAUGE FOR ENDOPHTHALMITIS;  Surgeon: Jalene Mullet, MD;  Location: [facility];  Service: Ophthalmology;   Laterality: Right;  . WISDOM TOOTH EXTRACTION      There were no vitals filed for this visit.   Subjective Assessment - 11/05/20 1620    Subjective Patient reports right shoulder pain, has been bothering him with dialysis because that is the arm he uses. Patient reports continued ankle, knee, and back pain. He reports inconsistency with exercises. Back pain a little higher this visit.    Patient Stated Goals Get feeling better and moving better    Currently in Pain? Yes    Pain Score 7     Pain Location Back    Pain Orientation Mid    Pain Descriptors / Indicators --   "muscle locking up"   Pain Type Chronic pain    Pain Onset More than a month ago    Pain Frequency Constant              [facility] PT Assessment - 11/05/20 0001      AROM   Overall AROM Comments Right shoulder AROM limited into elevation with increased pain and difficulty                         [facility] Adult PT Treatment/Exercise -  11/05/20 0001      Exercises   Exercises Lumbar      Lumbar Exercises: Stretches   Passive Hamstring Stretch 30 seconds    Passive Hamstring Stretch Limitations supine PROM    Lower Trunk Rotation 3 reps;10 seconds    Piriformis Stretch 30 seconds    Piriformis Stretch Limitations supine PROM    Other Lumbar Stretch Exercise Thoracic extension/shoulder elevation step back stretch at counter 5 x 10 sec      Lumbar Exercises: Aerobic   UBE (Upper Arm Bike) L5 x 5 min with UE/LE      Lumbar Exercises: Seated   Sit to Stand 10 reps      Lumbar Exercises: Supine   Bridge 10 reps    Straight Leg Raise 10 reps      Lumbar Exercises: Sidelying   Other Sidelying Lumbar Exercises Thoracic rotation open book 5 x 5 sec each                  PT Education - 11/05/20 1610    Education Details HEP update    Person(s) Educated Patient    Methods Explanation;Demonstration;Verbal cues;Handout    Comprehension Verbalized understanding;Returned demonstration;Verbal  cues required;Need further instruction            PT Short Term Goals - 11/05/20 1719      PT SHORT TERM GOAL #1   Title Patient will be I with initial HEP to progress with PT    Time 3    Period Weeks    Status On-going    Target Date 10/29/20      PT SHORT TERM GOAL #2   Title PT will review FOTO with patient by 3rd visit    Time 3    Period Weeks    Status On-going    Target Date 10/29/20             PT Long Term Goals - 10/08/20 1623      PT LONG TERM GOAL #1   Title Patient will be I with final HEP to maintain progress from PT    Time 6    Period Weeks    Status New    Target Date 11/19/20      PT LONG TERM GOAL #2   Title Patient will demonstrate improved hip strength >/= 4/5 MMT and knee and ankle strength = 5/5 MMT to improve walking tolerance    Time 6    Period Weeks    Status New    Target Date 11/19/20      PT LONG TERM GOAL #3   Title Patient will be able to sit and walk >/= 30 minutes without need for change of position    Time 6    Period Weeks    Status New    Target Date 11/19/20      PT LONG TERM GOAL #4   Title Patient will report </= 4/10 pain level with activity to improve mobility and activity level    Time 6    Period Weeks    Status New    Target Date 11/19/20      PT LONG TERM GOAL #5   Title Patient will report improved functional status >/= 54% functional status on FOTO    Time 6    Period Weeks    Status New    Target Date 11/19/20                 Plan -  11/05/20 1714    Clinical Impression Statement Patient tolerated therapy well with no adverse effects. Therapy continued focus on lumbar mobility, hip stretching, and incorporated thoracic mobility due to patient report of pain higher up back. Reviewed majority of HEP with patient and encouraged him to work on consistency. He tolerated core/strengthenig well and was able to perform sit<>stand exercise without UE and improved control. Patient does note increased  right shoulder pain which is same side as dialysis fistula which is likely contributing to pain. Patient is scheduled to see orthopedist on 11/11/2020 so will likely be addressed at that visit. He would benefit from continued skilled PT to progress his mobility and strength to reduce pain and maximize functional ability.    PT Treatment/Interventions ADLs/Self Care Home Management;Cryotherapy;Electrical Stimulation;Iontophoresis 4mg /ml Dexamethasone;Moist Heat;Traction;Ultrasound;Neuromuscular re-education;Balance training;Therapeutic exercise;Therapeutic activities;Functional mobility training;Stair training;Gait training;Patient/family education;Manual techniques;Dry needling;Passive range of motion;Taping;Spinal Manipulations;Joint Manipulations;Vasopneumatic Device    PT Next Visit Plan Review HEP and progress PRN, manual/stretching for low back/hips/hamstrings/quads, progress core and hip strengthening, general BLE strengthening progress to closed chain as able, assess and treat shoulder PRN    PT Home Exercise Plan [id number]    Consulted and Agree with Plan of Care Patient           Patient will benefit from skilled therapeutic intervention in order to improve the following deficits and impairments:  Decreased range of motion,Postural dysfunction,Decreased strength,Pain,Decreased activity tolerance,Impaired flexibility  Visit Diagnosis: Chronic bilateral low back pain, unspecified whether sciatica present  Chronic pain of left knee  Chronic pain of right knee  Pain in left ankle and joints of left foot  Pain in right ankle and joints of right foot  Muscle weakness (generalized)  Other abnormalities of gait and mobility     Problem List Patient Active Problem List   Diagnosis Date Noted  . ESRD (end stage renal disease) on dialysis ([place])   . Hypertension associated with stage 5 chronic kidney disease due to type 2 diabetes mellitus ([place]) 05/24/2020  . Hyperphosphatemia 05/24/2020   . Proteinuria 05/24/2020  . Legally blind 05/24/2020  . Diabetic retinopathy of both eyes ([place]) 05/24/2020  . Acute metabolic encephalopathy 40/05/6760  . Acute hypoxemic respiratory failure due to COVID-19 ([place]) 05/24/2020  . Pneumonia due to COVID-19 virus 05/23/2020  . Chronic diastolic heart failure ([place]) 10/24/2018  . 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 Mitral regurgitation, Moderate 09/05/2018  . ED (erectile dysfunction) 08/10/2017  . Chronic kidney disease, stage 5 ([place]) 07/18/2017  . Graves disease 03/02/2016  . Non compliance with medical treatment 11/26/2015  . Uncontrolled hypertension 11/26/2015  . Acute on chronic renal failure ([place]) 11/25/2015  . DM (diabetes mellitus), type 2 with ophthalmic complications ([place]) 95/06/3266    Hilda Blades, PT, DPT, LAT, ATC 11/05/20  5:28 PM Phone: [phone] Fax: [facility] [address] [place], [place], [address] Phone: [phone]   Fax:  [phone]  Name: Edward STANISLAWSKI Sr. MRN: [id number] Date of Birth: 07-Aug-1971

## 2020-11-06 DIAGNOSIS — N186 End stage renal disease: Secondary | ICD-10-CM | POA: Diagnosis not present

## 2020-11-06 DIAGNOSIS — Z992 Dependence on renal dialysis: Secondary | ICD-10-CM | POA: Diagnosis not present

## 2020-11-06 DIAGNOSIS — N2581 Secondary hyperparathyroidism of renal origin: Secondary | ICD-10-CM | POA: Diagnosis not present

## 2020-11-09 DIAGNOSIS — N2581 Secondary hyperparathyroidism of renal origin: Secondary | ICD-10-CM | POA: Diagnosis not present

## 2020-11-09 DIAGNOSIS — N186 End stage renal disease: Secondary | ICD-10-CM | POA: Diagnosis not present

## 2020-11-09 DIAGNOSIS — Z992 Dependence on renal dialysis: Secondary | ICD-10-CM | POA: Diagnosis not present

## 2020-11-11 ENCOUNTER — Ambulatory Visit: Payer: Medicare HMO | Admitting: Physician Assistant

## 2020-11-11 DIAGNOSIS — N2581 Secondary hyperparathyroidism of renal origin: Secondary | ICD-10-CM | POA: Diagnosis not present

## 2020-11-11 DIAGNOSIS — N186 End stage renal disease: Secondary | ICD-10-CM | POA: Diagnosis not present

## 2020-11-11 DIAGNOSIS — Z992 Dependence on renal dialysis: Secondary | ICD-10-CM | POA: Diagnosis not present

## 2020-11-12 ENCOUNTER — Ambulatory Visit: Payer: Medicare PPO | Admitting: Podiatry

## 2020-11-12 ENCOUNTER — Emergency Department (HOSPITAL_COMMUNITY)
Admission: EM | Admit: 2020-11-12 | Discharge: 2020-11-12 | Disposition: A | Discharge status: Home / Self Care | Payer: Medicare HMO | Attending: Emergency Medicine | Admitting: Emergency Medicine

## 2020-11-12 ENCOUNTER — Other Ambulatory Visit: Payer: Self-pay

## 2020-11-12 ENCOUNTER — Encounter (HOSPITAL_COMMUNITY): Payer: Self-pay | Admitting: Emergency Medicine

## 2020-11-12 ENCOUNTER — Encounter (HOSPITAL_COMMUNITY): Payer: Self-pay

## 2020-11-12 ENCOUNTER — Ambulatory Visit: Payer: Medicare HMO

## 2020-11-12 DIAGNOSIS — I509 Heart failure, unspecified: Secondary | ICD-10-CM | POA: Insufficient documentation

## 2020-11-12 DIAGNOSIS — N186 End stage renal disease: Secondary | ICD-10-CM | POA: Insufficient documentation

## 2020-11-12 DIAGNOSIS — Z79899 Other long term (current) drug therapy: Secondary | ICD-10-CM | POA: Insufficient documentation

## 2020-11-12 DIAGNOSIS — R197 Diarrhea, unspecified: Secondary | ICD-10-CM | POA: Insufficient documentation

## 2020-11-12 DIAGNOSIS — R000 Tachycardia, unspecified: Secondary | ICD-10-CM | POA: Diagnosis not present

## 2020-11-12 DIAGNOSIS — R1084 Generalized abdominal pain: Secondary | ICD-10-CM | POA: Insufficient documentation

## 2020-11-12 DIAGNOSIS — R110 Nausea: Secondary | ICD-10-CM | POA: Diagnosis not present

## 2020-11-12 DIAGNOSIS — E1140 Type 2 diabetes mellitus with diabetic neuropathy, unspecified: Secondary | ICD-10-CM | POA: Insufficient documentation

## 2020-11-12 DIAGNOSIS — I700 Atherosclerosis of aorta: Secondary | ICD-10-CM | POA: Diagnosis not present

## 2020-11-12 DIAGNOSIS — N261 Atrophy of kidney (terminal): Secondary | ICD-10-CM | POA: Diagnosis not present

## 2020-11-12 DIAGNOSIS — R112 Nausea with vomiting, unspecified: Secondary | ICD-10-CM | POA: Insufficient documentation

## 2020-11-12 DIAGNOSIS — Z5321 Procedure and treatment not carried out due to patient leaving prior to being seen by health care provider: Secondary | ICD-10-CM | POA: Insufficient documentation

## 2020-11-12 DIAGNOSIS — Z87891 Personal history of nicotine dependence: Secondary | ICD-10-CM | POA: Insufficient documentation

## 2020-11-12 DIAGNOSIS — E039 Hypothyroidism, unspecified: Secondary | ICD-10-CM | POA: Insufficient documentation

## 2020-11-12 DIAGNOSIS — Z992 Dependence on renal dialysis: Secondary | ICD-10-CM | POA: Insufficient documentation

## 2020-11-12 DIAGNOSIS — E1122 Type 2 diabetes mellitus with diabetic chronic kidney disease: Secondary | ICD-10-CM | POA: Insufficient documentation

## 2020-11-12 DIAGNOSIS — R109 Unspecified abdominal pain: Secondary | ICD-10-CM | POA: Insufficient documentation

## 2020-11-12 DIAGNOSIS — R1111 Vomiting without nausea: Secondary | ICD-10-CM | POA: Diagnosis not present

## 2020-11-12 DIAGNOSIS — I132 Hypertensive heart and chronic kidney disease with heart failure and with stage 5 chronic kidney disease, or end stage renal disease: Secondary | ICD-10-CM | POA: Insufficient documentation

## 2020-11-12 DIAGNOSIS — I10 Essential (primary) hypertension: Secondary | ICD-10-CM | POA: Diagnosis not present

## 2020-11-12 LAB — COMPREHENSIVE METABOLIC PANEL
ALT: 16 U/L (ref 0–44)
AST: 19 U/L (ref 15–41)
Albumin: 4.2 g/dL (ref 3.5–5.0)
Alkaline Phosphatase: 81 U/L (ref 38–126)
Anion gap: 19 — ABNORMAL HIGH (ref 5–15)
BUN: 28 mg/dL — ABNORMAL HIGH (ref 6–20)
CO2: 29 mmol/L (ref 22–32)
Calcium: 10.5 mg/dL — ABNORMAL HIGH (ref 8.9–10.3)
Chloride: 94 mmol/L — ABNORMAL LOW (ref 98–111)
Creatinine, Ser: 9.71 mg/dL — ABNORMAL HIGH (ref 0.61–1.24)
GFR, Estimated: 6 mL/min — ABNORMAL LOW (ref >60)
Glucose, Bld: 150 mg/dL — ABNORMAL HIGH (ref 70–99)
Potassium: 3.9 mmol/L (ref 3.5–5.1)
Sodium: 142 mmol/L (ref 135–145)
Total Bilirubin: 1 mg/dL (ref 0.3–1.2)
Total Protein: 7.9 g/dL (ref 6.5–8.1)

## 2020-11-12 LAB — CBC
HCT: 32.7 % — ABNORMAL LOW (ref 39.0–52.0)
Hemoglobin: 11 g/dL — ABNORMAL LOW (ref 13.0–17.0)
MCH: 34.9 pg — ABNORMAL HIGH (ref 26.0–34.0)
MCHC: 33.6 g/dL (ref 30.0–36.0)
MCV: 103.8 fL — ABNORMAL HIGH (ref 80.0–100.0)
Platelets: 223 10*3/uL (ref 150–400)
RBC: 3.15 MIL/uL — ABNORMAL LOW (ref 4.22–5.81)
RDW: 14.6 % (ref 11.5–15.5)
WBC: 7 10*3/uL (ref 4.0–10.5)
nRBC: 0 % (ref 0.0–0.2)

## 2020-11-12 LAB — LIPASE, BLOOD: Lipase: 47 U/L (ref 11–51)

## 2020-11-12 NOTE — ED Notes (Signed)
Patient uncooperative and shouting at triage. When asked about current problem, patient only states that he is in pain and that this has happened before. When asked for elaboration about his problem, patient states "I don't know, do your job its in your computer".

## 2020-11-12 NOTE — ED Triage Notes (Addendum)
Pt reports generalized abdominal pain and N/V/D that began today. Pt seen at [facility] ED for same and left due to wait time. Labs were done at [facility] ED. Pt has hx of CKD

## 2020-11-12 NOTE — ED Notes (Signed)
Patient BIB [facility] for abdominal pain, patient refused to answer questions to describe pain with EMS.  MWF dialysis, full treatment yesterday. Patient has oxycodone at home but did not take it.

## 2020-11-12 NOTE — ED Notes (Signed)
Called pt 3x, no response.  

## 2020-11-12 NOTE — ED Notes (Signed)
Called pt x2 for vitals, no response. °

## 2020-11-13 ENCOUNTER — Emergency Department (HOSPITAL_COMMUNITY)
Admission: EM | Admit: 2020-11-13 | Discharge: 2020-11-13 | Disposition: A | Discharge status: Home / Self Care | Payer: Medicare HMO | Source: Home / Self Care | Attending: Emergency Medicine | Admitting: Emergency Medicine

## 2020-11-13 ENCOUNTER — Emergency Department (HOSPITAL_COMMUNITY): Payer: Medicare HMO

## 2020-11-13 DIAGNOSIS — R1084 Generalized abdominal pain: Secondary | ICD-10-CM | POA: Diagnosis not present

## 2020-11-13 DIAGNOSIS — I700 Atherosclerosis of aorta: Secondary | ICD-10-CM | POA: Diagnosis not present

## 2020-11-13 DIAGNOSIS — N261 Atrophy of kidney (terminal): Secondary | ICD-10-CM | POA: Diagnosis not present

## 2020-11-13 LAB — LACTIC ACID, PLASMA: Lactic Acid, Venous: 1.5 mmol/L (ref 0.5–1.9)

## 2020-11-13 MED ORDER — ONDANSETRON 8 MG PO TBDP
8.0000 mg | ORAL_TABLET | Freq: Once | ORAL | Status: AC
  Administered 2020-11-13: 8 mg via ORAL
  Filled 2020-11-13: qty 1

## 2020-11-13 MED ORDER — HYDROMORPHONE HCL 2 MG/ML IJ SOLN
2.0000 mg | Freq: Once | INTRAMUSCULAR | Status: AC
  Administered 2020-11-13: 2 mg via INTRAMUSCULAR
  Filled 2020-11-13: qty 1

## 2020-11-13 NOTE — ED Provider Notes (Signed)
[place] DEPT Provider Note: Georgena Spurling, MD, FACEP  CSN: [id number] MRN: [id number] ARRIVAL: 11/12/20 at 2257 ROOM: [place]  Abdominal Pain   HISTORY OF PRESENT ILLNESS  11/13/20 12:47 AM Edward [name] is a 50 y.o. male with end-stage renal disease on hemodialysis last dialyzed 11/11/2020.  He is here with abdominal pain which began yesterday.  The pain is severe (10 out of 10) and diffuse but most prominent in the left lower quadrant.  It is worse with movement or palpation.  He has had associated nausea, vomiting and diarrhea.  He is legally blind.  He produces very little urine.   Past Medical History:  Diagnosis Date  . Acute on chronic renal failure ([place]) 11/25/2015  . Anemia   . Arthritis   . Blind    Left eye  . Cataract   . CHF (congestive heart failure) ([place])   . Chronic kidney disease    stage 5  . Depression    situatuional  . Diabetes mellitus    Type II  . Graves disease 2014  . Headache    in past  . Hx of adenomatous and sessile serrated colonic polyps 11/21/2019  . Hyperlipidemia   . Hypertension   . Hypothyroidism   . Legally blind    right eye  . Neuropathy   . Non-compliance   . Proteinuria 05/24/2020  . Shortness of breath dyspnea    "Better since I've been taking my medications"    Past Surgical History:  Procedure Laterality Date  . AV FISTULA PLACEMENT Right 04/22/2019   Procedure: RIGHT ARM ARTERIOVENOUS (AV) FISTULA CREATION;  Surgeon: Angelia Mould, MD;  Location: [facility];  Service: Vascular;  Laterality: Right;  . CIRCUMCISION     as a child, around 89 years of age  . EYE SURGERY Bilateral    "several "  . PARS PLANA VITRECTOMY Right 03/25/2016   Procedure: PARS PLANA VITRECTOMY 25 GAUGE FOR ENDOPHTHALMITIS;  Surgeon: Jalene Mullet, MD;  Location: [facility];  Service: Ophthalmology;  Laterality: Right;  . WISDOM TOOTH EXTRACTION      Family History  Problem Relation Age of Onset  .  Hypertension Mother   . Diabetes Mother   . Bladder Cancer Brother   . Kidney disease Other   . Colon cancer Neg Hx   . Rectal cancer Neg Hx     Social History   Tobacco Use  . Smoking status: Former Smoker    Packs/day: 0.75    Years: 3.00    Pack years: 2.25    Types: Cigarettes    Quit date: 05/28/2000    Years since quitting: 20.4  . Smokeless tobacco: Never Used  Vaping Use  . Vaping Use: Never used  Substance Use Topics  . Alcohol use: Yes    Alcohol/week: 0.0 standard drinks    Comment: 1 beer  ocassional  . Drug use: No    Prior to Admission medications   Medication Sig Start Date End Date Taking? Authorizing Provider  Accu-Chek Softclix Lancets lancets Use as instructed 08/21/20   Philemon Kingdom, MD  acetaminophen (TYLENOL) 500 MG tablet Take 500 mg by mouth every 6 (six) hours as needed.    [provider]  Alcohol Swabs (B-D SINGLE USE SWABS REGULAR) PADS Use as directed. 01/09/20   [name], MD  atorvastatin (LIPITOR) 40 MG tablet Take 1 tablet (40 mg total) by mouth daily. 10/22/19   [name], MD  Blood  Glucose Monitoring Suppl (ACCU-CHEK GUIDE ME) w/Device KIT Use 3 times daily before meals 08/21/20   Philemon Kingdom, MD  cephALEXin (KEFLEX) 500 MG capsule Take 1 capsule (500 mg total) by mouth 4 (four) times daily. 09/17/20   Deno Etienne, DO  DECADRON 6 MG tablet Take by mouth daily. 06/03/20   [provider]  diclofenac Sodium (VOLTAREN) 1 % GEL Apply 4 g topically 4 (four) times daily. 09/15/20   [name], MD  Emollient [facility] ADVANCED REPAIR) CREA Apply to dry skin as needed 07/17/20   Gildardo Pounds, NP  furosemide (LASIX) 80 MG tablet Take 80 mg by mouth 2 (two) times daily. 09/02/20   [provider]  gabapentin (NEURONTIN) 300 MG capsule Take 1 capsule (300 mg total) by mouth at bedtime. 09/15/20 12/14/20  [name], MD  glucose blood (ACCU-CHEK GUIDE) test strip Use as instructed 08/21/20   Philemon Kingdom, MD  glycopyrrolate (ROBINUL) 2 MG tablet Take by mouth. 09/22/20   [provider]  ketoconazole (NIZORAL) 2 % cream Apply 1 application topically daily. 08/11/20   Criselda Peaches, DPM  levothyroxine (SYNTHROID) 137 MCG tablet TAKE 1 TABLET(137 MCG) BY MOUTH DAILY BEFORE BREAKFAST 08/21/20   Philemon Kingdom, MD  meclizine (ANTIVERT) 25 MG tablet Take 1 tablet (25 mg total) by mouth 3 (three) times daily as needed for dizziness. 09/15/20   [name], MD  metolazone (ZAROXOLYN) 5 MG tablet  04/05/20   [provider]  omeprazole (PRILOSEC) 20 MG capsule Take 1 capsule (20 mg total) by mouth daily. 09/27/20   Hayden Rasmussen, MD  ondansetron (ZOFRAN ODT) 4 MG disintegrating tablet 66m ODT q4 hours prn nausea/vomit 09/17/20   FDeno Etienne DO  oxyCODONE (ROXICODONE) 5 MG immediate release tablet Take 0.5 tablets (2.5 mg total) by mouth every 4 (four) hours as needed for severe pain. 09/17/20   FDeno Etienne DO  SENSIPAR 30 MG tablet Take 30 mg by mouth daily. 09/22/20   [provider]  sevelamer carbonate (RENVELA) 800 MG tablet Take 1 tablet (800 mg total) by mouth 3 (three) times daily with meals. 05/29/20   ADaisy Floro DO  tiZANidine (ZANAFLEX) 4 MG tablet Take 1 tablet (4 mg total) by mouth every 6 (six) hours as needed for muscle spasms. 09/15/20   NCharlott Rakes MD    Allergies Patient has no known allergies.   REVIEW OF SYSTEMS  Negative except as noted here or in the History of Present Illness.   PHYSICAL EXAMINATION  Initial Vital Signs Blood pressure (!) 167/81, pulse (!) 103, temperature 98.1 F (36.7 C), temperature source Oral, resp. rate 20, SpO2 100 %.  Examination General: Well-developed, well-nourished male in no acute distress; appearance consistent with age of record HENT: normocephalic; atraumatic Eyes: Left cataract; extraocular muscles intact Neck: supple Heart: regular rate and rhythm Lungs: clear to auscultation  bilaterally Abdomen: soft; nondistended; diffusely tender most prominent in the left lower quadrant; bowel sounds hypoactive Extremities: No deformity; full range of motion; dialysis fistula right upper arm with pulse and thrill Neurologic: Awake, alert and oriented; motor function intact in all extremities and symmetric; no facial droop Skin: Warm and dry Psychiatric: Writhing; grimacing   RESULTS  Summary of this visit's results, reviewed and interpreted by myself:   EKG Interpretation  Date/Time:    Ventricular Rate:    PR Interval:    QRS Duration:   QT Interval:    QTC Calculation:   R Axis:  Text Interpretation:        Laboratory Studies: Results for orders placed or performed during the hospital encounter of 11/13/20 (from the past 24 hour(s))  Lactic acid, plasma     Status: None   Collection Time: 11/13/20  2:33 AM  Result Value Ref Range   Lactic Acid, Venous 1.5 0.5 - 1.9 mmol/L   Imaging Studies: CT ABDOMEN PELVIS WO CONTRAST  Result Date: 11/13/2020 CLINICAL DATA:  General abdominal pain. EXAM: CT ABDOMEN AND PELVIS WITHOUT CONTRAST TECHNIQUE: Multidetector CT imaging of the abdomen and pelvis was performed following the standard protocol without IV contrast. COMPARISON:  September 17, 2020 FINDINGS: Lower chest: There is a small pericardial effusion, stable from prior study.There are trace bilateral pleural effusions, left greater than right. There is mild cardiac enlargement, stable from prior study. Hepatobiliary: The liver is normal. Normal gallbladder.There is no biliary ductal dilation. Pancreas: Normal contours without ductal dilatation. No peripancreatic fluid collection. Spleen: Unremarkable. Adrenals/Urinary Tract: --Adrenal glands: Unremarkable. --Right kidney/ureter: The right kidney is somewhat atrophic --Left kidney/ureter: The left kidney is somewhat atrophic. --Urinary bladder: Unremarkable. Stomach/Bowel: --Stomach/Duodenum: No hiatal hernia or other  gastric abnormality. Normal duodenal course and caliber. --Small bowel: Unremarkable. --Colon: Unremarkable. --Appendix: Normal. Vascular/Lymphatic: Atherosclerotic calcification is present within the non-aneurysmal abdominal aorta, without hemodynamically significant stenosis. --No retroperitoneal lymphadenopathy. --No mesenteric lymphadenopathy. --No pelvic or inguinal lymphadenopathy. Reproductive: Unremarkable Other: No ascites or free air. The abdominal wall is normal. Musculoskeletal. No acute displaced fractures. IMPRESSION: 1. No acute abdominal or pelvic abnormality. 2. Trace bilateral pleural effusions, left greater than right. 3. Stable small pericardial effusion. Aortic Atherosclerosis (ICD10-I70.0). Electronically Signed   By: Constance Holster M.D.   On: 11/13/2020 03:11    ED COURSE and MDM  Nursing notes, initial and subsequent vitals signs, including pulse oximetry, reviewed and interpreted by myself.  Vitals:   11/12/20 2304 11/13/20 0109 11/13/20 0230 11/13/20 0330  BP: (!) 167/81 (!) 154/90 101/73 133/84  Pulse: (!) 103 87 82 85  Resp: 20 18 18 18   Temp: 98.1 F (36.7 C) 98.9 F (37.2 C)    TempSrc: Oral Oral    SpO2: 100% 96% 95% 94%   Medications  ondansetron (ZOFRAN-ODT) disintegrating tablet 8 mg (8 mg Oral Given 11/13/20 0058)  HYDROmorphone (DILAUDID) injection 2 mg (2 mg Intramuscular Given 11/13/20 0058)   3:44 AM Patient's pain is minimal at the present time.  His abdomen is soft with minimal left lower quadrant tenderness.  His laboratory studies did not show any significant abnormalities apart from his known renal insufficiency.  CT scan was nondiagnostic.  His pain could have been caused by a passed kidney stone but none was seen on the scan.  He has had similar pain in the past but has never been given a definitive etiology.  I do not believe admission or further diagnostic studies are indicated at this time but he was advised to return if symptoms recur or  worsen.   PROCEDURES  Procedures   ED DIAGNOSES     ICD-10-CM   1. Generalized abdominal pain  R10.84        Shanon Rosser, MD 11/13/20 [phone]

## 2020-11-14 ENCOUNTER — Encounter (HOSPITAL_COMMUNITY): Payer: Self-pay | Admitting: Emergency Medicine

## 2020-11-14 ENCOUNTER — Emergency Department (HOSPITAL_COMMUNITY)
Admission: EM | Admit: 2020-11-14 | Discharge: 2020-11-14 | Disposition: A | Discharge status: Home / Self Care | Payer: Medicare HMO | Attending: Emergency Medicine | Admitting: Emergency Medicine

## 2020-11-14 ENCOUNTER — Other Ambulatory Visit: Payer: Self-pay

## 2020-11-14 ENCOUNTER — Emergency Department (HOSPITAL_COMMUNITY): Payer: Medicare HMO

## 2020-11-14 DIAGNOSIS — R112 Nausea with vomiting, unspecified: Secondary | ICD-10-CM | POA: Insufficient documentation

## 2020-11-14 DIAGNOSIS — E039 Hypothyroidism, unspecified: Secondary | ICD-10-CM | POA: Diagnosis not present

## 2020-11-14 DIAGNOSIS — Z992 Dependence on renal dialysis: Secondary | ICD-10-CM | POA: Diagnosis not present

## 2020-11-14 DIAGNOSIS — R1013 Epigastric pain: Secondary | ICD-10-CM

## 2020-11-14 DIAGNOSIS — Z87891 Personal history of nicotine dependence: Secondary | ICD-10-CM | POA: Diagnosis not present

## 2020-11-14 DIAGNOSIS — R197 Diarrhea, unspecified: Secondary | ICD-10-CM | POA: Diagnosis not present

## 2020-11-14 DIAGNOSIS — I132 Hypertensive heart and chronic kidney disease with heart failure and with stage 5 chronic kidney disease, or end stage renal disease: Secondary | ICD-10-CM | POA: Insufficient documentation

## 2020-11-14 DIAGNOSIS — E1122 Type 2 diabetes mellitus with diabetic chronic kidney disease: Secondary | ICD-10-CM | POA: Insufficient documentation

## 2020-11-14 DIAGNOSIS — N186 End stage renal disease: Secondary | ICD-10-CM | POA: Insufficient documentation

## 2020-11-14 DIAGNOSIS — R6883 Chills (without fever): Secondary | ICD-10-CM | POA: Insufficient documentation

## 2020-11-14 DIAGNOSIS — R109 Unspecified abdominal pain: Secondary | ICD-10-CM | POA: Diagnosis not present

## 2020-11-14 DIAGNOSIS — I509 Heart failure, unspecified: Secondary | ICD-10-CM | POA: Insufficient documentation

## 2020-11-14 DIAGNOSIS — Z79899 Other long term (current) drug therapy: Secondary | ICD-10-CM | POA: Diagnosis not present

## 2020-11-14 LAB — CBC WITH DIFFERENTIAL/PLATELET
Abs Immature Granulocytes: 0.02 10*3/uL (ref 0.00–0.07)
Basophils Absolute: 0 10*3/uL (ref 0.0–0.1)
Basophils Relative: 0 %
Eosinophils Absolute: 0.1 10*3/uL (ref 0.0–0.5)
Eosinophils Relative: 1 %
HCT: 34 % — ABNORMAL LOW (ref 39.0–52.0)
Hemoglobin: 11.6 g/dL — ABNORMAL LOW (ref 13.0–17.0)
Immature Granulocytes: 0 %
Lymphocytes Relative: 26 %
Lymphs Abs: 1.8 10*3/uL (ref 0.7–4.0)
MCH: 35 pg — ABNORMAL HIGH (ref 26.0–34.0)
MCHC: 34.1 g/dL (ref 30.0–36.0)
MCV: 102.7 fL — ABNORMAL HIGH (ref 80.0–100.0)
Monocytes Absolute: 0.4 10*3/uL (ref 0.1–1.0)
Monocytes Relative: 6 %
Neutro Abs: 4.6 10*3/uL (ref 1.7–7.7)
Neutrophils Relative %: 67 %
Platelets: 231 10*3/uL (ref 150–400)
RBC: 3.31 MIL/uL — ABNORMAL LOW (ref 4.22–5.81)
RDW: 14.8 % (ref 11.5–15.5)
WBC: 7 10*3/uL (ref 4.0–10.5)
nRBC: 0 % (ref 0.0–0.2)

## 2020-11-14 LAB — COMPREHENSIVE METABOLIC PANEL
ALT: 15 U/L (ref 0–44)
AST: 21 U/L (ref 15–41)
Albumin: 4.6 g/dL (ref 3.5–5.0)
Alkaline Phosphatase: 81 U/L (ref 38–126)
Anion gap: 21 — ABNORMAL HIGH (ref 5–15)
BUN: 58 mg/dL — ABNORMAL HIGH (ref 6–20)
CO2: 30 mmol/L (ref 22–32)
Calcium: 9.4 mg/dL (ref 8.9–10.3)
Chloride: 90 mmol/L — ABNORMAL LOW (ref 98–111)
Creatinine, Ser: 13.81 mg/dL — ABNORMAL HIGH (ref 0.61–1.24)
GFR, Estimated: 4 mL/min — ABNORMAL LOW (ref >60)
Glucose, Bld: 96 mg/dL (ref 70–99)
Potassium: 3.8 mmol/L (ref 3.5–5.1)
Sodium: 141 mmol/L (ref 135–145)
Total Bilirubin: 1 mg/dL (ref 0.3–1.2)
Total Protein: 8.5 g/dL — ABNORMAL HIGH (ref 6.5–8.1)

## 2020-11-14 LAB — LIPASE, BLOOD: Lipase: 90 U/L — ABNORMAL HIGH (ref 11–51)

## 2020-11-14 LAB — CBG MONITORING, ED: Glucose-Capillary: 106 mg/dL — ABNORMAL HIGH (ref 70–99)

## 2020-11-14 MED ORDER — ALUM & MAG HYDROXIDE-SIMETH 200-200-20 MG/5ML PO SUSP
30.0000 mL | Freq: Once | ORAL | Status: AC
  Administered 2020-11-14: 30 mL via ORAL
  Filled 2020-11-14: qty 30

## 2020-11-14 MED ORDER — LIDOCAINE VISCOUS HCL 2 % MT SOLN
15.0000 mL | Freq: Once | OROMUCOSAL | Status: AC
  Administered 2020-11-14: 15 mL via ORAL
  Filled 2020-11-14: qty 15

## 2020-11-14 MED ORDER — ONDANSETRON HCL 4 MG/2ML IJ SOLN
4.0000 mg | Freq: Once | INTRAMUSCULAR | Status: AC
  Administered 2020-11-14: 4 mg via INTRAVENOUS
  Filled 2020-11-14: qty 2

## 2020-11-14 MED ORDER — MORPHINE SULFATE (PF) 4 MG/ML IV SOLN: 4.0000 mg | Freq: Once | INTRAVENOUS | Status: DC

## 2020-11-14 MED ORDER — SUCRALFATE 1 G PO TABS: 1.0000 g | ORAL_TABLET | Freq: Three times a day (TID) | ORAL | 0 refills | Status: DC

## 2020-11-14 MED ORDER — PANTOPRAZOLE SODIUM 40 MG IV SOLR
40.0000 mg | Freq: Once | INTRAVENOUS | Status: AC
  Administered 2020-11-14: 40 mg via INTRAVENOUS
  Filled 2020-11-14: qty 40

## 2020-11-14 MED ORDER — SUCRALFATE 1 G PO TABS
1.0000 g | ORAL_TABLET | Freq: Once | ORAL | Status: AC
  Administered 2020-11-14: 1 g via ORAL
  Filled 2020-11-14: qty 1

## 2020-11-14 MED ORDER — ONDANSETRON 4 MG PO TBDP: 4.0000 mg | ORAL_TABLET | Freq: Once | ORAL | Status: DC

## 2020-11-14 MED ORDER — HYDROMORPHONE HCL 2 MG/ML IJ SOLN
2.0000 mg | Freq: Once | INTRAMUSCULAR | Status: AC
  Administered 2020-11-14: 2 mg via INTRAVENOUS
  Filled 2020-11-14: qty 1

## 2020-11-14 MED ORDER — OXYCODONE-ACETAMINOPHEN 5-325 MG PO TABS: 1.0000 | ORAL_TABLET | Freq: Once | ORAL | Status: DC

## 2020-11-14 MED ORDER — OMEPRAZOLE 20 MG PO CPDR: 40.0000 mg | DELAYED_RELEASE_CAPSULE | Freq: Two times a day (BID) | ORAL | 0 refills | Status: DC

## 2020-11-14 MED ORDER — MORPHINE SULFATE (PF) 4 MG/ML IV SOLN
4.0000 mg | Freq: Once | INTRAVENOUS | Status: AC
Start: 2020-11-14 — End: 2020-11-14
  Administered 2020-11-14: 4 mg via INTRAVENOUS
  Filled 2020-11-14: qty 1

## 2020-11-14 NOTE — ED Notes (Signed)
Patient's son at bedside.

## 2020-11-14 NOTE — ED Triage Notes (Signed)
Patient c/o generalized abdominal pain. Seen for same yesterday.

## 2020-11-14 NOTE — ED Provider Notes (Signed)
[facility]-EMERGENCY DEPT Provider Note   CSN: [id number] Arrival date & time: 11/14/20  1421     History Chief Complaint  Patient presents with  . Abdominal Pain    Edward VANDYKEN Sr. is a 50 y.o. male history of end-stage renal disease on dialysis (M,W,F schedule; last dialysis 11/11/20), legally blind, hypertension, diabetes, Graves' disease, CHF, hyperlipidemia.  Presents with chief complaint of epigastric abdominal pain, reports pain started 2 to 3 days prior, pain has been constant and progressively worsening, pain rated 10/10 on pain scale, pain does not radiate, pain is worse with eating, reports that medication he received yesterday while in the emergency department relieved his symptoms.  Reports associated nausea and vomiting.  Unable to quantify amount of times vomited in the last 24 hours.  Denies any bloody emesis or coffee-ground emesis.  Patient also endorses chills and diarrhea. He reports that he does produce small amount of urine, denies any dysuria or hematuria.  Patient denies any alcohol or NSAID use.    Per chart review patient went to the [facility] Emergency department on 11/12/20, lab work was drawn however patient eloped prior to seeing a provider.  He was seen at [facility] the night of 11/12/20.  CT abdomen pelvis showed no acute abdominal or pelvic abnormality, trace bilateral pleural effusions, able small pericardial effusion.  He received Zofran, Dilaudid discharged.    Per chart review patient was also seen in December for similar complaints.  Patient had CT scan possible UTI patient was treated with antibiotics.  Patient was done on trial of PPI and given referral to GI. Reports that he was seen by the [facility], is unsure the name of the doctor he saw. Denies any endoscopy performed. Patient denies taking any proton pump inhibitors.    HPI     Past Medical History:  Diagnosis Date  . Acute on chronic renal failure (HCC)  11/25/2015  . Anemia   . Arthritis   . Blind    Left eye  . Cataract   . CHF (congestive heart failure) (HCC)   . Chronic kidney disease    stage 5  . Depression    situatuional  . Diabetes mellitus    Type II  . Graves disease 2014  . Headache    in past  . Hx of adenomatous and sessile serrated colonic polyps 11/21/2019  . Hyperlipidemia   . Hypertension   . Hypothyroidism   . Legally blind    right eye  . Neuropathy   . Non-compliance   . Proteinuria 05/24/2020  . Shortness of breath dyspnea    "Better since I've been taking my medications"    Patient Active Problem List   Diagnosis Date Noted  . ESRD (end stage renal disease) on dialysis (HCC)   . Hypertension associated with stage 5 chronic kidney disease due to type 2 diabetes mellitus (HCC) 05/24/2020  . Hyperphosphatemia 05/24/2020  . Proteinuria 05/24/2020  . Legally blind 05/24/2020  . Diabetic retinopathy of both eyes (HCC) 05/24/2020  . Acute metabolic encephalopathy 05/24/2020  . Acute hypoxemic respiratory failure due to COVID-19 (HCC) 05/24/2020  . Pneumonia due to COVID-19 virus 05/23/2020  . Chronic diastolic heart failure (HCC) 10/24/2018  . 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 Mitral regurgitation, Moderate 09/05/2018  . ED (erectile dysfunction) 08/10/2017  . Chronic kidney disease, stage 5 (HCC) 07/18/2017  . Graves disease 03/02/2016  . Non compliance with medical treatment 11/26/2015  . Uncontrolled hypertension 11/26/2015  .  Acute on chronic renal failure (HCC) 11/25/2015  . DM (diabetes mellitus), 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    Past Surgical History:  Procedure Laterality Date  . AV FISTULA PLACEMENT Right 04/22/2019   Procedure: RIGHT ARM ARTERIOVENOUS (AV) FISTULA CREATION;  Surgeon: Chuck Hint, MD;  Location: [facility] OR;  Service: Vascular;  Laterality: Right;  . CIRCUMCISION     as a child, around 6 years of age  . EYE SURGERY Bilateral     "several "  . PARS PLANA VITRECTOMY Right 03/25/2016   Procedure: PARS PLANA VITRECTOMY 25 GAUGE FOR ENDOPHTHALMITIS;  Surgeon: Carmela Rima, MD;  Location: [facility] OR;  Service: Ophthalmology;  Laterality: Right;  . WISDOM TOOTH EXTRACTION         Family History  Problem Relation Age of Onset  . Hypertension Mother   . Diabetes Mother   . Bladder Cancer Brother   . Kidney disease Other   . Colon cancer Neg Hx   . Rectal cancer Neg Hx     Social History   Tobacco Use  . Smoking status: Former Smoker    Packs/day: 0.75    Years: 3.00    Pack years: 2.25    Types: Cigarettes    Quit date: 05/28/2000    Years since quitting: 20.4  . Smokeless tobacco: Never Used  Vaping Use  . Vaping Use: Never used  Substance Use Topics  . Alcohol use: Yes    Alcohol/week: 0.0 standard drinks    Comment: 1 beer  ocassional  . Drug use: No    Home Medications Prior to Admission medications   Medication Sig Start Date End Date Taking? Authorizing Provider  Accu-Chek Softclix Lancets lancets Use as instructed 08/21/20   Carlus Pavlov, MD  acetaminophen (TYLENOL) 500 MG tablet Take 500 mg by mouth every 6 (six) hours as needed.    [provider]  Alcohol Swabs (B-D SINGLE USE SWABS REGULAR) PADS Use as directed. 01/09/20   Hoy Register, MD  atorvastatin (LIPITOR) 40 MG tablet Take 1 tablet (40 mg total) by mouth daily. 10/22/19   Hoy Register, MD  Blood Glucose Monitoring Suppl (ACCU-CHEK GUIDE ME) w/Device KIT Use 3 times daily before meals 08/21/20   Carlus Pavlov, MD  cephALEXin (KEFLEX) 500 MG capsule Take 1 capsule (500 mg total) by mouth 4 (four) times daily. 09/17/20   Melene Plan, DO  DECADRON 6 MG tablet Take by mouth daily. 06/03/20   [provider]  diclofenac Sodium (VOLTAREN) 1 % GEL Apply 4 g topically 4 (four) times daily. 09/15/20   Hoy Register, MD  Emollient [facility] ADVANCED REPAIR) CREA Apply to dry skin as needed 07/17/20   Claiborne Rigg, NP   furosemide (LASIX) 80 MG tablet Take 80 mg by mouth 2 (two) times daily. 09/02/20   [provider]  gabapentin (NEURONTIN) 300 MG capsule Take 1 capsule (300 mg total) by mouth at bedtime. 09/15/20 12/14/20  Hoy Register, MD  glucose blood (ACCU-CHEK GUIDE) test strip Use as instructed 08/21/20   Carlus Pavlov, MD  glycopyrrolate (ROBINUL) 2 MG tablet Take by mouth. 09/22/20   [provider]  ketoconazole (NIZORAL) 2 % cream Apply 1 application topically daily. 08/11/20   Edwin Cap, DPM  levothyroxine (SYNTHROID) 137 MCG tablet TAKE 1 TABLET(137 MCG) BY MOUTH DAILY BEFORE BREAKFAST 08/21/20   Carlus Pavlov, MD  meclizine (ANTIVERT) 25 MG tablet Take 1 tablet (25 mg total) by mouth 3 (three) times daily as needed  for dizziness. 09/15/20   Hoy Register, MD  metolazone (ZAROXOLYN) 5 MG tablet  04/05/20   [provider]  omeprazole (PRILOSEC) 20 MG capsule Take 1 capsule (20 mg total) by mouth daily. 09/27/20   Terrilee Files, MD  ondansetron (ZOFRAN ODT) 4 MG disintegrating tablet 4mg  ODT q4 hours prn nausea/vomit 09/17/20   Melene Plan, DO  oxyCODONE (ROXICODONE) 5 MG immediate release tablet Take 0.5 tablets (2.5 mg total) by mouth every 4 (four) hours as needed for severe pain. 09/17/20   Melene Plan, DO  SENSIPAR 30 MG tablet Take 30 mg by mouth daily. 09/22/20   [provider]  sevelamer carbonate (RENVELA) 800 MG tablet Take 1 tablet (800 mg total) by mouth 3 (three) times daily with meals. 05/29/20   Dollene Cleveland, DO  tiZANidine (ZANAFLEX) 4 MG tablet Take 1 tablet (4 mg total) by mouth every 6 (six) hours as needed for muscle spasms. 09/15/20   Hoy Register, MD    Allergies    Patient has no known allergies.  Review of Systems   Review of Systems  Constitutional: Positive for chills. Negative for fever.  Eyes: Positive for visual disturbance (legally blind).  Respiratory: Negative for shortness of breath.    Cardiovascular: Negative for chest pain.  Gastrointestinal: Positive for abdominal pain, diarrhea, nausea and vomiting. Negative for blood in stool and constipation.  Genitourinary: Negative for difficulty urinating, dysuria and hematuria.  Musculoskeletal: Negative for back pain and neck pain.  Skin: Negative for color change and rash.  Neurological: Negative for dizziness, syncope, light-headedness and headaches.  Psychiatric/Behavioral: Negative for confusion.    Physical Exam Updated Vital Signs BP (!) 154/79 (BP Location: Left Arm)   Pulse 89   Temp 99.1 F (37.3 C) (Oral)   Resp (!) 22   SpO2 100%   Physical Exam Vitals and nursing note reviewed.  Constitutional:      General: He is not in acute distress.    Appearance: He is not ill-appearing, toxic-appearing or diaphoretic.     Comments: Discomfort due to patient's abdominal pain  HENT:     Head: Normocephalic.  Eyes:     General: No scleral icterus.       Right eye: No discharge.        Left eye: No discharge.  Cardiovascular:     Rate and Rhythm: Normal rate.     Pulses:          Radial pulses are 3+ on the right side and 3+ on the left side.       Femoral pulses are 3+ on the right side and 3+ on the left side. Pulmonary:     Effort: Pulmonary effort is normal. No respiratory distress.     Breath sounds: Normal breath sounds. No decreased breath sounds, wheezing, rhonchi or rales.  Abdominal:     General: Bowel sounds are normal. There is no distension. There are no signs of injury.     Palpations: Abdomen is soft. There is no mass or pulsatile mass.     Tenderness: There is abdominal tenderness in the epigastric area and left upper quadrant. There is no right CVA tenderness, left CVA tenderness, guarding or rebound.  Musculoskeletal:     Cervical back: Neck supple.     Comments: Fistula noted to right upper extremity, palpable thrill, no erythema noted to fistula or surrounding skin.  Skin:    General: Skin  is warm and dry.     Coloration: Skin  is not cyanotic, jaundiced or pale.  Neurological:     General: No focal deficit present.     Mental Status: He is alert.  Psychiatric:        Behavior: Behavior is cooperative.     ED Results / Procedures / Treatments   Labs (all labs ordered are listed, but only abnormal results are displayed) Labs Reviewed  COMPREHENSIVE METABOLIC PANEL - Abnormal; Notable for the following components:      Result Value   Chloride 90 (*)    BUN 58 (*)    Creatinine, Ser 13.81 (*)    Total Protein 8.5 (*)    GFR, Estimated 4 (*)    Anion gap 21 (*)    All other components within normal limits  CBC WITH DIFFERENTIAL/PLATELET - Abnormal; Notable for the following components:   RBC 3.31 (*)    Hemoglobin 11.6 (*)    HCT 34.0 (*)    MCV 102.7 (*)    MCH 35.0 (*)    All other components within normal limits  LIPASE, BLOOD - Abnormal; Notable for the following components:   Lipase 90 (*)    All other components within normal limits  CBG MONITORING, ED - Abnormal; Notable for the following components:   Glucose-Capillary 106 (*)    All other components within normal limits    EKG None  Radiology CT ABDOMEN PELVIS WO CONTRAST  Result Date: 11/13/2020 CLINICAL DATA:  General abdominal pain. EXAM: CT ABDOMEN AND PELVIS WITHOUT CONTRAST TECHNIQUE: Multidetector CT imaging of the abdomen and pelvis was performed following the standard protocol without IV contrast. COMPARISON:  September 17, 2020 FINDINGS: Lower chest: There is a small pericardial effusion, stable from prior study.There are trace bilateral pleural effusions, left greater than right. There is mild cardiac enlargement, stable from prior study. Hepatobiliary: The liver is normal. Normal gallbladder.There is no biliary ductal dilation. Pancreas: Normal contours without ductal dilatation. No peripancreatic fluid collection. Spleen: Unremarkable. Adrenals/Urinary Tract: --Adrenal glands: Unremarkable.  --Right kidney/ureter: The right kidney is somewhat atrophic --Left kidney/ureter: The left kidney is somewhat atrophic. --Urinary bladder: Unremarkable. Stomach/Bowel: --Stomach/Duodenum: No hiatal hernia or other gastric abnormality. Normal duodenal course and caliber. --Small bowel: Unremarkable. --Colon: Unremarkable. --Appendix: Normal. Vascular/Lymphatic: Atherosclerotic calcification is present within the non-aneurysmal abdominal aorta, without hemodynamically significant stenosis. --No retroperitoneal lymphadenopathy. --No mesenteric lymphadenopathy. --No pelvic or inguinal lymphadenopathy. Reproductive: Unremarkable Other: No ascites or free air. The abdominal wall is normal. Musculoskeletal. No acute displaced fractures. IMPRESSION: 1. No acute abdominal or pelvic abnormality. 2. Trace bilateral pleural effusions, left greater than right. 3. Stable small pericardial effusion. Aortic Atherosclerosis (ICD10-I70.0). Electronically Signed   By: Katherine Mantle M.D.   On: 11/13/2020 03:11    Procedures Procedures   Medications Ordered in ED Medications  alum & mag hydroxide-simeth (MAALOX/MYLANTA) 200-200-20 MG/5ML suspension 30 mL (30 mLs Oral Given 11/14/20 1527)    And  lidocaine (XYLOCAINE) 2 % viscous mouth solution 15 mL (15 mLs Oral Given 11/14/20 1527)  morphine 4 MG/ML injection 4 mg (4 mg Intravenous Given 11/14/20 1524)  ondansetron (ZOFRAN) injection 4 mg (4 mg Intravenous Given 11/14/20 1525)  HYDROmorphone (DILAUDID) injection 2 mg (2 mg Intravenous Given 11/14/20 1628)  pantoprazole (PROTONIX) injection 40 mg (40 mg Intravenous Given 11/14/20 1626)  sucralfate (CARAFATE) tablet 1 g (1 g Oral Given 11/14/20 1630)    ED Course  I have reviewed the triage vital signs and the nursing notes.  Pertinent labs & imaging results that were available  during my care of the patient were reviewed by me and considered in my medical decision making (see chart for details).    MDM  Rules/Calculators/A&P                          Alert 50 year old male in acute discomfort due to his abdominal pain. Patient indicates epigastric pain, nausea and vomiting. Per chart review seen on 11/12/20 with complaints of abdominal pain. Noncontrast CT abdomen pelvis showed no acute abdominal or pelvic abnormality. Chart view shows a patient was last seen in December 2021 with some complaints of abdominal pain. Patient was started on PPI and told to follow-up with GI. Reports that he followed up with [facility] but denies taking any PPI.    Patient's abdomen is soft, nondistended, tenderness to epigastric. +3 radial pulses bilaterally and + femoral pulses bilaterally.    Patient was ordered Zofran, GI cocktail, and morphine injection. Lipase, CBC, CMP, ordered.   On reexamination patient is abdominal soft, nondistended tenderness to epigastric.  Patient reports minimal improvement in his pain from Zofran, GI cocktail and morphine.  Dilaudid, Protonix, and Carafate ordered for patient.  Chest x-ray was ordered to evaluate for free air in abdomen.  Chest x-ray showed no acute cardiopulmonary findings, no free air identified under the hemidiaphragms.  Lipase elevated at 90, up from 47 2 days prior.  Less likely acute pancreatitis as lipase is not 3 times normal limit and recent CT abdomen pelvis showed no acute abdominal abnormality.  AST, ALT, alk phos, total bili all within normal limits; less concerning for hepatobiliary disease.  RBC, hemoglobin, hematocrit all decreased however appear to be at patient's baseline.  BUN elevated to 50, creatinine elevated at 13.81,  anion gap 21, bicarb within normal limits.  Patient worsening renal function likely due to his baseline ESRD and missed dialysis appointment on Friday.  Patient's anion gap likely due to uremia.  After receiving Dilaudid patient was noted to become somnolent.  Patient was easily arousable.  Patient had periods of apnea when sleeping  however resolve when roused.  On serial reexaminations patient's airway remained patent, no cyanosis noted, patient remained easily arousable.  Patient's somnolence decreased with time.  Patient remained alert.  Patient reported improvement in his abdominal pain.  On call [facility] physician was contacted to discuss patient's case.  Spoke with Dr. Adela Lank at 19:30 who reviewed patient's labs, imaging, and notes.  He recommended starting patient on omeprazole 40 mg twice daily as well as Carafate.  He will have his office contact patient for follow-up appointment.    Patient was informed of new medications and follow-up with the [facility].  Discussed results, findings, treatment and follow up. Patient advised of return precautions. Patient verbalized understanding and agreed with plan.  Patient was discussed with and evaluated by Dr. Rhunette Croft.   Final Clinical Impression(s) / ED Diagnoses Final diagnoses:  Epigastric abdominal pain  Nausea vomiting and diarrhea    Rx / DC Orders ED Discharge Orders         Ordered    sucralfate (CARAFATE) 1 g tablet  3 times daily with meals & bedtime        11/14/20 2013    omeprazole (PRILOSEC) 20 MG capsule  2 times daily before meals        11/14/20 2013           Berneice Heinrich 11/15/20 0103    Rhunette Croft,  Ankit, MD 11/15/20 1330

## 2020-11-14 NOTE — ED Notes (Signed)
Patient's sister at bedside.

## 2020-11-14 NOTE — ED Notes (Signed)
Patients pain is 10+++ Patient is very agitated/vomiting trying to get out of bed Patient is a high fall risk Fall Bundle: Yellow socks Fall risk armband Bed alarm on  Fall risk sign outside of door

## 2020-11-14 NOTE — ED Notes (Addendum)
Pt son came to desk and requested the first and last name of nurse. Pt son was advised that last names of staff are not given. Pt son started to yell at this RN and staff cursing loudly as well as being physically and verbally threatning. When pt son was asked to lower his voice, he stated "I don't want to talk to no goddamn body. Fuck ya'll. My father has been here for a fucking hour and ya'll haven't done a goddamn thing". Security and [employer] escorted pt son out and advised that he would be trespassed and taken to jail if he did not leave. Pt son left cursing at staff with security and [employer]. Security notified screeners of pt identity so he will not be able to visit d/t threatening behavior.

## 2020-11-14 NOTE — Discharge Instructions (Addendum)
You came to the emergency department to be evaluated for your abdominal pain.  Your lab work was consistent with labs obtained on Friday.  This lab results are reassuring.  The CT scan of your abdomen performed on Friday showed no acute abdominal problems.  You were given medication in the emergency department to help with your pain.  It is likely that your pain is due to peptic ulcer disease.  Due to this I have started you on omeprazole 40 mg twice a day.  I have also started you on Carafate pills, please take 1 pill before each meal.  I have spoken to the [facility] and they will be contacting you next week to schedule an appointment.  If you do not hear from them by Wednesday please call their office.    Get help right away if: You have sudden, sharp, or persistent pain in your abdomen. You have bloody or dark black, tarry stools. You vomit blood or material that looks like coffee grounds. You become light-headed or you feel faint. You become weak. You become sweaty or clammy.

## 2020-11-16 DIAGNOSIS — Z992 Dependence on renal dialysis: Secondary | ICD-10-CM | POA: Diagnosis not present

## 2020-11-16 DIAGNOSIS — N2581 Secondary hyperparathyroidism of renal origin: Secondary | ICD-10-CM | POA: Diagnosis not present

## 2020-11-16 DIAGNOSIS — N186 End stage renal disease: Secondary | ICD-10-CM | POA: Diagnosis not present

## 2020-11-18 DIAGNOSIS — N186 End stage renal disease: Secondary | ICD-10-CM | POA: Diagnosis not present

## 2020-11-18 DIAGNOSIS — N2581 Secondary hyperparathyroidism of renal origin: Secondary | ICD-10-CM | POA: Diagnosis not present

## 2020-11-18 DIAGNOSIS — Z992 Dependence on renal dialysis: Secondary | ICD-10-CM | POA: Diagnosis not present

## 2020-11-19 ENCOUNTER — Ambulatory Visit (INDEPENDENT_AMBULATORY_CARE_PROVIDER_SITE_OTHER): Payer: Medicare HMO | Admitting: Internal Medicine

## 2020-11-19 ENCOUNTER — Ambulatory Visit: Payer: Medicare HMO | Admitting: Physician Assistant

## 2020-11-19 ENCOUNTER — Encounter: Payer: Self-pay | Admitting: Internal Medicine

## 2020-11-19 ENCOUNTER — Ambulatory Visit: Payer: Medicare HMO | Admitting: Physical Therapy

## 2020-11-19 VITALS — BP 98/60 | HR 77 | Ht 72.0 in | Wt 201.4 lb

## 2020-11-19 DIAGNOSIS — R748 Abnormal levels of other serum enzymes: Secondary | ICD-10-CM | POA: Diagnosis not present

## 2020-11-19 DIAGNOSIS — Z992 Dependence on renal dialysis: Secondary | ICD-10-CM

## 2020-11-19 DIAGNOSIS — R634 Abnormal weight loss: Secondary | ICD-10-CM | POA: Diagnosis not present

## 2020-11-19 DIAGNOSIS — R1033 Periumbilical pain: Secondary | ICD-10-CM | POA: Diagnosis not present

## 2020-11-19 DIAGNOSIS — N186 End stage renal disease: Secondary | ICD-10-CM

## 2020-11-19 MED ORDER — PROMETHAZINE HCL 25 MG PO TABS: 25.0000 mg | ORAL_TABLET | Freq: Four times a day (QID) | ORAL | 0 refills | Status: DC | PRN

## 2020-11-19 MED ORDER — OXYCODONE HCL 5 MG PO CAPS: 5.0000 mg | ORAL_CAPSULE | ORAL | 0 refills | Status: DC | PRN

## 2020-11-19 NOTE — Progress Notes (Signed)
Edward CLOCK Sr. 50 y.o. [birth year] [id number]  Assessment & Plan:   Encounter Diagnoses  Name Primary?  . Periumbilical abdominal pain Yes  . Elevated lipase   . Loss of weight   . ESRD (end stage renal disease) on dialysis ([place])     I am suspicious of mesenteric ischemia and will look for that with a CT angiogram.  He has not had IV contrast on any of his previous studies.  Given that he has end-stage renal disease and is on dialysis permanently unless he gets a kidney transplant I think it is reasonable to proceed with using contrast. Treatment as below for his symptoms.  The elevated lipase may really be just reflective of reduced clearance in a man with end-stage renal disease and not related to his problems.  If the CT angiogram is unrevealing then we would need to proceed with an EGD I think.  He is fearful of this because his sister ended up with a tracheostomy after an asthma flare.  I tried to reassure him about this and how that is incredibly unlikely to happen related to an EGD and that we were not entering the airway like happened with her when she was sick and what sounds like failing and endotracheal intubation requiring a tracheostomy or suffering complications from intubation.  Meds ordered this encounter  Medications  . promethazine (PHENERGAN) 25 MG tablet    Sig: Take 1 tablet (25 mg total) by mouth every 6 (six) hours as needed for nausea or vomiting.    Dispense:  30 tablet    Refill:  0  . oxycodone (OXY-IR) 5 MG capsule    Sig: Take 1 capsule (5 mg total) by mouth every 4 (four) hours as needed for pain.    Dispense:  20 capsule    Refill:  0       Subjective:   Chief Complaint: Abdominal pain and weight loss  HPI  50 year old black man with end-stage renal disease on dialysis, diabetes mellitus, Graves' disease, congestive heart failure history of adenomatous and sessile serrated colon polyps who presents after being seen in the  emergency department on 11/14/2020.  I have reviewed those notes imaging and labs.  He was having recurrent epigastric pain having had several visits since September 17, 2020 initial visit then on September 23 2020. He was in the ER the day before on 11/14/2019 as well notes reviewed.  He had a working diagnosis of peptic ulcer disease.  When he was in the ED on 212 his son became upset asking for attention for his father.  He had to be removed from the ED.  The patient also had apnea and hypoxia from hydromorphone.  Labs and CT scanning results are listed below   He has continued to have problems with what really seems to be more of a periumbilical pain and lower areas after eating.  Dialysis does not cause this.  He has had to change how he eats and is not eating so much due to the fear of pain.  Weight is listed below.  He has nausea and vomiting and diarrhea at times as well.  He has found that smoking 1 or 2 marijuana blunts a day helps.  If he is having the pain he will not be able to sleep but apparently is not awakening him.  PPI and Carafate antispasmodics are not helping.  He makes some urine still and uses diuretics but he is committed to hemodialysis with the  hope for a transplant as he has started pretransplant work-up. Lab Results  Component Value Date   WBC 7.0 11/14/2020   HGB 11.6 (L) 11/14/2020   HCT 34.0 (L) 11/14/2020   MCV 102.7 (H) 11/14/2020   PLT 231 11/14/2020   Lab Results  Component Value Date   CREATININE 13.81 (H) 11/14/2020   BUN 58 (H) 11/14/2020   NA 141 11/14/2020   K 3.8 11/14/2020   CL 90 (L) 11/14/2020   CO2 30 11/14/2020   Lab Results  Component Value Date   ALT 15 11/14/2020   AST 21 11/14/2020   ALKPHOS 81 11/14/2020   BILITOT 1.0 11/14/2020     Lab Results  Component Value Date   LIPASE 90 (H) 11/14/2020   Lactic acid 1.5   CT scan reports-images viewed-no contrast given IMPRESSION: 1. No acute abdominal or pelvic abnormality. 2. Trace  bilateral pleural effusions, left greater than right. 3. Stable small pericardial effusion.  Aortic Atherosclerosis (ICD10-I70.0).   Electronically Signed   By: Constance Holster M.D.   On: 11/13/2020 03:11   IMPRESSION: 1. Slightly hazy and circumferential thickening of the urinary bladder wall. Findings could represent infection. Differential diagnosis includes obstructive uropathy in the setting of prostatomegaly. Recommend correlation with urinalysis for infection. 2. Otherwise no acute intra-abdominal or intrapelvic abnormality.   Electronically Signed   By: Iven Finn M.D.   On: 09/17/2020 15:44   November 13, 2019 colonoscopy for diarrhea   - Three 3 to 5 mm polyps in the sigmoid colon, in the transverse colon and in the cecum, removed with a cold snare. Resected and retrieved. - One 1 to 2 mm polyp in the sigmoid colon, removed with a cold biopsy forceps. Resected and retrieved. - The examination was otherwise normal on direct and retroflexion views. - Biopsies were taken with a cold forceps from the ascending colon, transverse colon, descending colon, sigmoid colon and rectum for evaluation of microscopic colitis.  Polyps adenomas, normal biopsies recommended repeat visit with GI physician assistant performed 12/05/2019 and his diarrhea and bloating had resolved   Wt Readings from Last 3 Encounters:  11/19/20 201 lb 6.4 oz (91.4 kg)  09/27/20 206 lb (93.4 kg)  09/17/20 213 lb 13.5 oz (97 kg)    No Known Allergies Current Meds  Medication Sig  . Accu-Chek Softclix Lancets lancets Use as instructed  . acetaminophen (TYLENOL) 500 MG tablet Take 500 mg by mouth every 6 (six) hours as needed.  . Alcohol Swabs (B-D SINGLE USE SWABS REGULAR) PADS Use as directed.  Marland Kitchen atorvastatin (LIPITOR) 40 MG tablet Take 1 tablet (40 mg total) by mouth daily.  . Blood Glucose Monitoring Suppl (ACCU-CHEK GUIDE ME) w/Device KIT Use 3 times daily before meals  .  diclofenac Sodium (VOLTAREN) 1 % GEL Apply 4 g topically 4 (four) times daily.  . Emollient (EUCERIN ADVANCED REPAIR) CREA Apply to dry skin as needed  . furosemide (LASIX) 80 MG tablet Take 80 mg by mouth 2 (two) times daily.  Marland Kitchen gabapentin (NEURONTIN) 300 MG capsule Take 1 capsule (300 mg total) by mouth at bedtime.  Marland Kitchen glucose blood (ACCU-CHEK GUIDE) test strip Use as instructed  . glycopyrrolate (ROBINUL) 2 MG tablet Take by mouth.  Marland Kitchen ketoconazole (NIZORAL) 2 % cream Apply 1 application topically daily.  Marland Kitchen levothyroxine (SYNTHROID) 137 MCG tablet TAKE 1 TABLET(137 MCG) BY MOUTH DAILY BEFORE BREAKFAST  . metolazone (ZAROXOLYN) 5 MG tablet   . omeprazole (PRILOSEC) 20 MG capsule Take 2  capsules (40 mg total) by mouth 2 (two) times daily before a meal.  . oxycodone (OXY-IR) 5 MG capsule Take 1 capsule (5 mg total) by mouth every 4 (four) hours as needed for pain.  . promethazine (PHENERGAN) 25 MG tablet Take 1 tablet (25 mg total) by mouth every 6 (six) hours as needed for nausea or vomiting.  . SENSIPAR 30 MG tablet Take 30 mg by mouth daily.  . sevelamer carbonate (RENVELA) 800 MG tablet Take 1 tablet (800 mg total) by mouth 3 (three) times daily with meals.  . sucralfate (CARAFATE) 1 g tablet Take 1 tablet (1 g total) by mouth 4 (four) times daily -  with meals and at bedtime.   Past Medical History:  Diagnosis Date  . Acute on chronic renal failure ([place]) 11/25/2015  . Anemia   . Arthritis   . Blind    Left eye  . Cataract   . CHF (congestive heart failure) ([place])   . Chronic kidney disease    stage 5  . COVID-19   . Depression    situatuional  . Diabetes mellitus    Type II  . Graves disease 2014  . Headache    in past  . Hx of adenomatous and sessile serrated colonic polyps 11/21/2019  . Hyperlipidemia   . Hypertension   . Hypothyroidism   . Legally blind    right eye  . Neuropathy   . Non-compliance   . Proteinuria 05/24/2020  . Shortness of breath dyspnea    "Better  since I've been taking my medications"   Past Surgical History:  Procedure Laterality Date  . AV FISTULA PLACEMENT Right 04/22/2019   Procedure: RIGHT ARM ARTERIOVENOUS (AV) FISTULA CREATION;  Surgeon: Angelia Mould, MD;  Location: [facility];  Service: Vascular;  Laterality: Right;  . CIRCUMCISION     as a child, around 20 years of age  . EYE SURGERY Bilateral    "several "  . PARS PLANA VITRECTOMY Right 03/25/2016   Procedure: PARS PLANA VITRECTOMY 25 GAUGE FOR ENDOPHTHALMITIS;  Surgeon: Jalene Mullet, MD;  Location: [facility];  Service: Ophthalmology;  Laterality: Right;  . WISDOM TOOTH EXTRACTION     Social History   Social History Narrative   Divorced   30 children   28 yo son with him at home   Disabled chef - cafeterias, Chiropodist, [employer], etc      Former cigarette smoker, marijuana now, rare alcohol, other drugs   family history includes Bladder Cancer in his brother; Diabetes in his mother; Hypertension in his mother; Kidney disease in an other family member.   Review of Systems   Objective:   Physical Exam BP 98/60   Pulse 77   Ht 6' (1.829 m)   Wt 201 lb 6.4 oz (91.4 kg)   BMI 27.31 kg/m  NAD Lungs cta Cor NL s1s2 no rmg abd mildly tender LQ - similar with abd tension - neg Carnett's

## 2020-11-19 NOTE — Patient Instructions (Signed)
Normal BMI (Body Mass Index- based on height and weight) is between 19 and 25. Your BMI today is Body mass index is 27.31 kg/m. Marland Kitchen Please consider follow up  regarding your BMI with your Primary Care Provider.  You will be contacted by [facility] in the next 2 days to arrange a CT scan..  The number on your caller ID will be [phone], please answer when they call.  If you have not heard from them in 2 days please call [phone] to schedule.    Due to recent changes in healthcare laws, you may see the results of your imaging and laboratory studies on MyChart before your provider has had a chance to review them.  We understand that in some cases there may be results that are confusing or concerning to you. Not all laboratory results come back in the same time frame and the provider may be waiting for multiple results in order to interpret others.  Please give [place] 48 hours in order for your provider to thoroughly review all the results before contacting the office for clarification of your results.   We have sent the following medications to your pharmacy for you to pick up at your convenience: Promethazine, oxycodone  I appreciate the opportunity to care for you. Silvano Rusk, MD, [facility]

## 2020-11-20 DIAGNOSIS — Z992 Dependence on renal dialysis: Secondary | ICD-10-CM | POA: Diagnosis not present

## 2020-11-20 DIAGNOSIS — N2581 Secondary hyperparathyroidism of renal origin: Secondary | ICD-10-CM | POA: Diagnosis not present

## 2020-11-20 DIAGNOSIS — N186 End stage renal disease: Secondary | ICD-10-CM | POA: Diagnosis not present

## 2020-11-23 DIAGNOSIS — Z992 Dependence on renal dialysis: Secondary | ICD-10-CM | POA: Diagnosis not present

## 2020-11-23 DIAGNOSIS — N186 End stage renal disease: Secondary | ICD-10-CM | POA: Diagnosis not present

## 2020-11-23 DIAGNOSIS — N2581 Secondary hyperparathyroidism of renal origin: Secondary | ICD-10-CM | POA: Diagnosis not present

## 2020-11-25 ENCOUNTER — Ambulatory Visit: Payer: Medicare HMO | Admitting: Physician Assistant

## 2020-11-25 DIAGNOSIS — N186 End stage renal disease: Secondary | ICD-10-CM | POA: Diagnosis not present

## 2020-11-25 DIAGNOSIS — N2581 Secondary hyperparathyroidism of renal origin: Secondary | ICD-10-CM | POA: Diagnosis not present

## 2020-11-25 DIAGNOSIS — Z992 Dependence on renal dialysis: Secondary | ICD-10-CM | POA: Diagnosis not present

## 2020-11-27 ENCOUNTER — Ambulatory Visit: Payer: Medicare PPO | Admitting: Internal Medicine

## 2020-11-27 DIAGNOSIS — N2581 Secondary hyperparathyroidism of renal origin: Secondary | ICD-10-CM | POA: Diagnosis not present

## 2020-11-27 DIAGNOSIS — N186 End stage renal disease: Secondary | ICD-10-CM | POA: Diagnosis not present

## 2020-11-27 DIAGNOSIS — Z992 Dependence on renal dialysis: Secondary | ICD-10-CM | POA: Diagnosis not present

## 2020-11-27 NOTE — Progress Notes (Deleted)
Patient ID: Edward [name], male   DOB: April 09, 1971, 50 y.o.   MRN: [id number]  This visit occurred during the SARS-CoV-2 public health emergency.  Safety protocols were in place, including screening questions prior to the visit, additional usage of staff PPE, and extensive cleaning of exam room while observing appropriate contact time as indicated for disinfecting solutions.   HPI  Edward MARCOUX Sr. is a 50 y.o.-year-old male, initially referred by his PCP, Edward. Edward [name], returning for f/u for Graves ds, now with post ablative hypothyroidism.  He also has type 2 diabetes (with Edward, end-stage renal disease, ED, chronic diastolic heart failure), which is uncontrolled.  Last visit 3 months ago.  He was seen repeatedly in the emergency room for abdominal pain in the last 2 months.  He had an increased lipase however, I his gastroenterologist believe that these may be related to decreased clearance in the setting of ESRD.  The suspicion is for mesenteric ischemia.  He continues to lose weight.  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  Since last visit, he had a Thyroid Uptake and Scan (05/24/2017): 1. Elevated 24 hour radioactive iodine uptake equal to 36.9%. 2. Heterogeneous tracer activity within both lobes of the thyroid gland suggesting multinodular goiter.  He had RAI Tx (06/23/2017).  Afterwards, he was noncompliant with his levothyroxine and his office visits and TSH remains elevated.  In the past he was taking levothyroxine at night along with calcium.  I advised him to move levothyroxine the morning  Pt is on levothyroxine 137 mcg daily, taken: - MISSING MANY DOSES - in am, right before hemodialysis - fasting - at least 30 min from b'fast - no calcium - no iron - no multivitamins - no PPIs - not on  Biotin  Reviewed his TFTs: Lab Results  Component Value Date   TSH 138.59 (H) 08/21/2020   TSH 35.088 (H) 05/23/2020   TSH 99.30 (H) 02/12/2020   TSH 72.05 (H) 12/20/2019   TSH 79.400 (H) 10/21/2019   TSH >49.10 (H) 10/22/2018   TSH 86.600 (H) 08/20/2018   TSH <0.01 (L) 08/10/2017   TSH <0.01 (L) 05/12/2017   TSH <0.01 (L) 02/21/2017   FREET4 0.14 (L) 08/21/2020   FREET4 0.19 (L) 02/12/2020   FREET4 0.44 (L) 12/20/2019   FREET4 0.41 (L) 10/21/2019   FREET4 0.26 (L) 10/22/2018   FREET4 2.92 (H) 08/10/2017   FREET4 1.69 (H) 05/12/2017   FREET4 1.48 02/21/2017   FREET4 1.50 09/20/2016   FREET4 1.24 05/31/2016    His TSI's were elevated Lab Results  Component Value Date   TSI 463 (H) 02/26/2016   Pt denies: - feeling nodules in neck - hoarseness - dysphagia - choking - SOB with lying down  No FH of thyroid cancer. No h/o radiation tx to head or neck.  No herbal supplements. No Biotin use. No recent steroids use.   Reviewed his HbA1c levels: Lab Results  Component Value Date   HGBA1C 7.0 (A) 08/21/2020   HGBA1C 6.7 (H) 05/24/2020   HGBA1C 5.8 (A) 02/12/2020   HGBA1C 10.8 11/05/2019   HGBA1C 7.9 (A) 10/15/2019   HGBA1C 7.3 (H) 08/13/2019   HGBA1C 6.4 10/30/2018   HGBA1C 6.6 06/06/2018   HGBA1C 5.6 12/01/2017   HGBA1C 5.5 08/31/2017   He is on: - Glipizide 2.5 mg before  breakfast  At last visit he was not checking sugars and I strongly advised him to start.  Now checking once a day: - am: - 2h after b'fast: - lunch: - 2h after lunch: - dinner: - 2h after dinner: - bedtime:  He has end-stage renal disease, on hemodialysis (at 11:30 AM): Lab Results  Component Value Date   BUN 58 (H) 11/14/2020   BUN 28 (H) 11/12/2020   BUN 29 (H) 09/27/2020   BUN 28 (H) 09/23/2020   BUN 27 (H) 09/17/2020   BUN 75 (H) 05/28/2020   BUN 102 (H) 05/27/2020   BUN 93 (H) 05/26/2020   BUN 96 (H) 05/25/2020   BUN 107 (H) 05/25/2020   Lab Results  Component Value Date    CREATININE 13.81 (H) 11/14/2020   CREATININE 9.71 (H) 11/12/2020   CREATININE 14.62 (H) 09/27/2020   CREATININE 13.77 (H) 09/23/2020   CREATININE 12.43 (H) 09/17/2020   CREATININE 5.30 (H) 05/28/2020   CREATININE 6.39 (H) 05/27/2020   CREATININE 6.94 (H) 05/26/2020   CREATININE 6.95 (H) 05/25/2020   CREATININE 8.10 (H) 05/25/2020   Last eye exam was in 2021.  He had several eye surgeries for Edward OU.  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  He also has a history of back pain, osteoarthritis.  ROS: Constitutional: no weight gain/no weight loss, no fatigue, no subjective hyperthermia, no subjective hypothermia Eyes: no blurry vision, no xerophthalmia ENT: no sore throat, + see HPI Cardiovascular: no CP/no SOB/no palpitations/no leg swelling Respiratory: no cough/no SOB/no wheezing Gastrointestinal: no N/no V/no D/no C/no acid reflux Musculoskeletal: no muscle aches/no joint aches Skin: no rashes, no hair loss Neurological: no tremors/no numbness/no tingling/no dizziness  I reviewed pt's medications, allergies, PMH, social hx, family hx, and changes were documented in the history of present illness. Otherwise, unchanged from my initial visit note.  Past Medical History:  Diagnosis Date  . Acute on chronic renal failure ([place]) 11/25/2015  . Anemia   . Arthritis   . Blind    Left eye  . Cataract   . CHF (congestive heart failure) ([place])   . Chronic kidney disease    stage 5  . COVID-19   . Depression    situatuional  . Diabetes mellitus    Type II  . Graves disease 2014  . Headache    in past  . Hx of adenomatous and sessile serrated colonic polyps 11/21/2019  . Hyperlipidemia   . Hypertension   . Hypothyroidism   . Legally blind    right eye  . Neuropathy   . Non-compliance   . Proteinuria 05/24/2020  . Shortness of breath dyspnea    "Better since I've been taking my medications"   Past Surgical History:  Procedure Laterality Date  . AV FISTULA  PLACEMENT Right 04/22/2019   Procedure: RIGHT ARM ARTERIOVENOUS (AV) FISTULA CREATION;  Surgeon: Angelia Mould, MD;  Location: [place];  Service: Vascular;  Laterality: Right;  . CIRCUMCISION     as a child, around 43 years of age  . EYE SURGERY Bilateral    "several "  . PARS PLANA VITRECTOMY Right 03/25/2016   Procedure: PARS PLANA VITRECTOMY 25 GAUGE FOR ENDOPHTHALMITIS;  Surgeon: Jalene Mullet, MD;  Location: [place];  Service: Ophthalmology;  Laterality: Right;  . WISDOM TOOTH EXTRACTION     Social History   Social History  . Marital Status: Married    Spouse Name: N/A  . Number of Children: 5:  25-4 y/o (2017)   Occupational History  . Chef-cook   Social History Main Topics  . Smoking status: Former Smoker -- 0.75 packs/day for 3 years    Types: Cigarettes    Quit date: 2004  . Smokeless tobacco: Not on file  . Alcohol Use:      Comment: 1 beer every night  . Drug Use: No   Current Outpatient Medications on File Prior to Visit  Medication Sig Dispense Refill  . Accu-Chek Softclix Lancets lancets Use as instructed 100 each 12  . acetaminophen (TYLENOL) 500 MG tablet Take 500 mg by mouth every 6 (six) hours as needed.    . Alcohol Swabs (B-D SINGLE USE SWABS REGULAR) PADS Use as directed. 100 each 6  . atorvastatin (LIPITOR) 40 MG tablet Take 1 tablet (40 mg total) by mouth daily. 30 tablet 6  . Blood Glucose Monitoring Suppl (ACCU-CHEK GUIDE ME) w/Device KIT Use 3 times daily before meals 1 kit 0  . diclofenac Sodium (VOLTAREN) 1 % GEL Apply 4 g topically 4 (four) times daily. 100 g 1  . Emollient (EUCERIN ADVANCED REPAIR) CREA Apply to dry skin as needed 454 g 0  . furosemide (LASIX) 80 MG tablet Take 80 mg by mouth 2 (two) times daily.    Marland Kitchen gabapentin (NEURONTIN) 300 MG capsule Take 1 capsule (300 mg total) by mouth at bedtime. 90 capsule 1  . glucose blood (ACCU-CHEK GUIDE) test strip Use as instructed 100 each 12  . glycopyrrolate (ROBINUL) 2 MG tablet Take by  mouth.    Marland Kitchen ketoconazole (NIZORAL) 2 % cream Apply 1 application topically daily. 60 g 2  . levothyroxine (SYNTHROID) 137 MCG tablet TAKE 1 TABLET(137 MCG) BY MOUTH DAILY BEFORE BREAKFAST 45 tablet 5  . metolazone (ZAROXOLYN) 5 MG tablet     . omeprazole (PRILOSEC) 20 MG capsule Take 2 capsules (40 mg total) by mouth 2 (two) times daily before a meal. 120 capsule 0  . oxycodone (OXY-IR) 5 MG capsule Take 1 capsule (5 mg total) by mouth every 4 (four) hours as needed for pain. 20 capsule 0  . promethazine (PHENERGAN) 25 MG tablet Take 1 tablet (25 mg total) by mouth every 6 (six) hours as needed for nausea or vomiting. 30 tablet 0  . SENSIPAR 30 MG tablet Take 30 mg by mouth daily.    . sevelamer carbonate (RENVELA) 800 MG tablet Take 1 tablet (800 mg total) by mouth 3 (three) times daily with meals. 90 tablet 0  . sucralfate (CARAFATE) 1 g tablet Take 1 tablet (1 g total) by mouth 4 (four) times daily -  with meals and at bedtime. 120 tablet 0   No current facility-administered medications on file prior to visit.   No Known Allergies Family History  Problem Relation Age of Onset  . Hypertension Mother   . Diabetes Mother   . Bladder Cancer Brother   . Kidney disease Other   . Colon cancer Neg Hx   . Rectal cancer Neg Hx    PE: There were no vitals taken for this visit.  Wt Readings from Last 3 Encounters:  11/19/20 201 lb 6.4 oz (91.4 kg)  09/27/20 206 lb (93.4 kg)  09/17/20 213 lb 13.5 oz (97 kg)   Constitutional: overweight, in NAD Eyes: PERRLA, EOMI, + exophthalmos ENT: moist mucous membranes, no thyromegaly, no cervical lymphadenopathy Cardiovascular: RRR, No MRG Respiratory: CTA B Gastrointestinal: abdomen soft, NT, ND, BS+ Musculoskeletal: no deformities, strength intact in all 4 Skin:  moist, warm, no rashes Neurological: no tremor with outstretched hands, DTR normal in all 4  ASSESSMENT: 1. Postablative hypothyroidism  2. H/o Graves ds - thyrotoxicosis since at  least 2014  3.  Type 2 diabetes, uncontrolled, with complications - dCHF - ESRD - Edward - ED  PLAN:  1.  Patient with post ablative hypothyroidism developed after RAI treatment for Graves' disease.  She was consistently noncompliant with his levothyroxine medication and had very high TSH levels.  At last visit I advised him that if he does not start taking the medications daily as advised, I cannot help him and he should not return for another appointment. - latest thyroid labs reviewed with pt >> extremely high: Lab Results  Component Value Date   TSH 138.59 (H) 08/21/2020   - he continues on LT4 137 mcg daily - pt feels good on this dose. - we discussed about taking the thyroid hormone every day, with water, >30 minutes before breakfast, separated by >4 hours from acid reflux medications, calcium, iron, multivitamins. Pt. is taking it correctly. - will check thyroid tests today: TSH and fT4 - If labs are abnormal, he will need to return for repeat TFTs in 1.5 months  2.  History of Graves' disease -Diagnosed thousand 14 -He had positive TSI antibodies -He is status post RAI treatment in 06/2018 -He does not have active Graves' ophthalmopathy but he does have exophthalmos -He has slight left thyromegaly but denies neck compression symptoms  3.  Type 2 diabetes -On the low-dose glipizide, 2.5 mg daily -Latest HbA1c (from last visit) was 7.0%, slightly higher -At that time I advised him to check his sugars every day or every other day and sent a prescription for meter and glucometer supplies to his pharmacy -At today's visit, *** -I advised him to: There are no Patient Instructions on file for this visit. -I will see him back in 3 to 4 months  Philemon Kingdom, MD PhD [facility] Endocrinology

## 2020-11-29 DIAGNOSIS — J9621 Acute and chronic respiratory failure with hypoxia: Secondary | ICD-10-CM | POA: Diagnosis not present

## 2020-11-29 DIAGNOSIS — U071 COVID-19: Secondary | ICD-10-CM | POA: Diagnosis not present

## 2020-11-30 DIAGNOSIS — N2581 Secondary hyperparathyroidism of renal origin: Secondary | ICD-10-CM | POA: Diagnosis not present

## 2020-11-30 DIAGNOSIS — E1129 Type 2 diabetes mellitus with other diabetic kidney complication: Secondary | ICD-10-CM | POA: Diagnosis not present

## 2020-11-30 DIAGNOSIS — N186 End stage renal disease: Secondary | ICD-10-CM | POA: Diagnosis not present

## 2020-11-30 DIAGNOSIS — Z992 Dependence on renal dialysis: Secondary | ICD-10-CM | POA: Diagnosis not present

## 2020-11-30 DIAGNOSIS — Z01818 Encounter for other preprocedural examination: Secondary | ICD-10-CM | POA: Diagnosis not present

## 2020-12-01 ENCOUNTER — Other Ambulatory Visit: Payer: Self-pay

## 2020-12-01 ENCOUNTER — Encounter (HOSPITAL_COMMUNITY): Payer: Self-pay

## 2020-12-01 ENCOUNTER — Ambulatory Visit (HOSPITAL_COMMUNITY)
Admission: RE | Admit: 2020-12-01 | Discharge: 2020-12-01 | Disposition: A | Discharge status: Home / Self Care | Payer: Medicare HMO | Source: Ambulatory Visit | Attending: Internal Medicine | Admitting: Internal Medicine

## 2020-12-01 DIAGNOSIS — R1033 Periumbilical pain: Secondary | ICD-10-CM | POA: Insufficient documentation

## 2020-12-01 DIAGNOSIS — R634 Abnormal weight loss: Secondary | ICD-10-CM | POA: Diagnosis not present

## 2020-12-01 DIAGNOSIS — R109 Unspecified abdominal pain: Secondary | ICD-10-CM | POA: Diagnosis not present

## 2020-12-01 MED ORDER — IOHEXOL 350 MG/ML SOLN
100.0000 mL | Freq: Once | INTRAVENOUS | Status: AC | PRN
  Administered 2020-12-01: 100 mL via INTRAVENOUS

## 2020-12-02 DIAGNOSIS — Z992 Dependence on renal dialysis: Secondary | ICD-10-CM | POA: Diagnosis not present

## 2020-12-02 DIAGNOSIS — N186 End stage renal disease: Secondary | ICD-10-CM | POA: Diagnosis not present

## 2020-12-02 DIAGNOSIS — N2581 Secondary hyperparathyroidism of renal origin: Secondary | ICD-10-CM | POA: Diagnosis not present

## 2020-12-04 ENCOUNTER — Telehealth: Payer: Self-pay | Admitting: Internal Medicine

## 2020-12-04 DIAGNOSIS — N186 End stage renal disease: Secondary | ICD-10-CM | POA: Diagnosis not present

## 2020-12-04 DIAGNOSIS — Z992 Dependence on renal dialysis: Secondary | ICD-10-CM | POA: Diagnosis not present

## 2020-12-04 DIAGNOSIS — N2581 Secondary hyperparathyroidism of renal origin: Secondary | ICD-10-CM | POA: Diagnosis not present

## 2020-12-04 NOTE — Telephone Encounter (Signed)
See CT results for details

## 2020-12-07 DIAGNOSIS — N2581 Secondary hyperparathyroidism of renal origin: Secondary | ICD-10-CM | POA: Diagnosis not present

## 2020-12-07 DIAGNOSIS — Z992 Dependence on renal dialysis: Secondary | ICD-10-CM | POA: Diagnosis not present

## 2020-12-07 DIAGNOSIS — N186 End stage renal disease: Secondary | ICD-10-CM | POA: Diagnosis not present

## 2020-12-08 ENCOUNTER — Encounter: Payer: Self-pay | Admitting: Physical Therapy

## 2020-12-08 ENCOUNTER — Ambulatory Visit: Payer: Medicare HMO | Attending: Family Medicine | Admitting: Physical Therapy

## 2020-12-08 ENCOUNTER — Other Ambulatory Visit: Payer: Self-pay

## 2020-12-08 DIAGNOSIS — G8929 Other chronic pain: Secondary | ICD-10-CM

## 2020-12-08 DIAGNOSIS — M25571 Pain in right ankle and joints of right foot: Secondary | ICD-10-CM | POA: Diagnosis not present

## 2020-12-08 DIAGNOSIS — M25561 Pain in right knee: Secondary | ICD-10-CM | POA: Diagnosis not present

## 2020-12-08 DIAGNOSIS — R2689 Other abnormalities of gait and mobility: Secondary | ICD-10-CM | POA: Insufficient documentation

## 2020-12-08 DIAGNOSIS — M545 Low back pain, unspecified: Secondary | ICD-10-CM

## 2020-12-08 DIAGNOSIS — M25562 Pain in left knee: Secondary | ICD-10-CM | POA: Insufficient documentation

## 2020-12-08 DIAGNOSIS — M25572 Pain in left ankle and joints of left foot: Secondary | ICD-10-CM

## 2020-12-08 DIAGNOSIS — M6281 Muscle weakness (generalized): Secondary | ICD-10-CM | POA: Diagnosis not present

## 2020-12-08 DIAGNOSIS — M25511 Pain in right shoulder: Secondary | ICD-10-CM | POA: Diagnosis not present

## 2020-12-08 NOTE — Therapy (Signed)
[place], [place], [address] Phone: [phone]   Fax:  [phone]  Physical Therapy Treatment / ERO  Patient Details  Name: Edward ARREDONDO Sr. MRN: [id number] Date of Birth: Jun 16, 1971 Referring Provider (PT): [name], MD   Encounter Date: 12/08/2020   PT End of Session - 12/08/20 1005    Visit Number 3    Number of Visits 9    Date for PT Re-Evaluation 01/19/21    Authorization Type [employer] MEDICARE    Authorization Time Period 10/08/20 - 12/10/20    Progress Note Due on Visit 10    PT Start Time 1002    PT Stop Time 1042    PT Time Calculation (min) 40 min    Activity Tolerance Patient tolerated treatment well    Behavior During Therapy [facility] for tasks assessed/performed           Past Medical History:  Diagnosis Date  . Acute on chronic renal failure ([place]) 11/25/2015  . Anemia   . Arthritis   . Blind    Left eye  . Cataract   . CHF (congestive heart failure) ([place])   . Chronic kidney disease    stage 5  . COVID-19   . Depression    situatuional  . Diabetes mellitus    Type II  . Graves disease 2014  . Headache    in past  . Hx of adenomatous and sessile serrated colonic polyps 11/21/2019  . Hyperlipidemia   . Hypertension   . Hypothyroidism   . Legally blind    right eye  . Neuropathy   . Non-compliance   . Proteinuria 05/24/2020  . Shortness of breath dyspnea    "Better since I've been taking my medications"    Past Surgical History:  Procedure Laterality Date  . AV FISTULA PLACEMENT Right 04/22/2019   Procedure: RIGHT ARM ARTERIOVENOUS (AV) FISTULA CREATION;  Surgeon: Angelia Mould, MD;  Location: [facility];  Service: Vascular;  Laterality: Right;  . CIRCUMCISION     as a child, around 41 years of age  . EYE SURGERY Bilateral    "several "  . PARS PLANA VITRECTOMY Right 03/25/2016   Procedure: PARS PLANA VITRECTOMY 25 GAUGE FOR ENDOPHTHALMITIS;  Surgeon:  Jalene Mullet, MD;  Location: [facility];  Service: Ophthalmology;  Laterality: Right;  . WISDOM TOOTH EXTRACTION      There were no vitals filed for this visit.   Subjective Assessment - 12/08/20 1002    Subjective Patient reports he has been to the ER a couple times since last visit due to pain in his abdomen, they still don't know what is causing his pain. Currently pain isn't too bad in his abdomen. He reports still having pain in right shoulder, his back pain hasn't been giving him any real problems lately. Denies any knee or ankle pain, ankle pain has been gone for a while.    Limitations Sitting;Lifting;Standing;Walking;House hold activities    How long can you sit comfortably? > 30 minutes    How long can you walk comfortably? 30 minutes    Patient Stated Goals Get feeling better and moving better    Currently in Pain? Yes    Pain Score 4     Pain Location Shoulder    Pain Orientation Right    Pain Descriptors / Indicators Dull    Pain Type Chronic pain    Pain Onset More than a month ago  Pain Frequency Constant    Aggravating Factors  Raising up or lifting with right arm    Pain Score 1    Pain Location Back    Pain Orientation Lower    Pain Descriptors / Indicators Aching;Dull    Pain Type Chronic pain    Pain Onset More than a month ago    Pain Frequency Intermittent    Aggravating Factors  Bending, standing, walking, sittting, twisting              [facility] PT Assessment - 12/08/20 0001      Assessment   Medical Diagnosis Arthralgia    Referring Provider (PT) [name], MD    Hand Dominance Right      Precautions   Precautions None    Precaution Comments Possible lifting precautions with RUE due to dialysis fistula      Restrictions   Weight Bearing Restrictions No      Balance Screen   Has the patient fallen in the past 6 months No    Has the patient had a decrease in activity level because of a fear of falling?  No    Is the patient reluctant to  leave their home because of a fear of falling?  No      Prior Function   Level of Independence Independent      Cognition   Overall Cognitive Status Within Functional Limits for tasks assessed      Observation/Other Assessments   Observations Patient appears in no apparent distress    Focus on Therapeutic Outcomes (FOTO)  51% functional status      AROM   AROM Assessment Site Shoulder    Right/Left Shoulder Right;Left    Right Shoulder Flexion 155 Degrees    Right Shoulder ABduction 160 Degrees    Right Shoulder External Rotation 70 Degrees    Left Shoulder Flexion 160 Degrees    Left Shoulder ABduction 160 Degrees    Left Shoulder External Rotation 70 Degrees      Strength   Strength Assessment Site Shoulder;Hip;Knee;Ankle;Elbow    Right/Left Shoulder Right;Left    Right Shoulder Flexion 4/5   pain   Right Shoulder Internal Rotation 5/5    Right Shoulder External Rotation 4/5   pain   Left Shoulder Flexion 5/5    Left Shoulder Internal Rotation 5/5    Left Shoulder External Rotation 5/5    Right/Left Elbow Right;Left    Right Elbow Flexion 4+/5   pain   Left Elbow Flexion 5/5    Right Hip Flexion 4+/5    Right Hip Extension 4/5    Right Hip ABduction 4/5    Left Hip Flexion 4+/5    Left Hip Extension 4/5    Left Hip ABduction 4/5    Right Knee Flexion 5/5    Right Knee Extension 5/5    Left Knee Flexion 5/5    Left Knee Extension 5/5    Right Ankle Dorsiflexion 5/5    Right Ankle Plantar Flexion 5/5    Left Ankle Dorsiflexion 5/5    Left Ankle Plantar Flexion 5/5                         [facility] Adult PT Treatment/Exercise - 12/08/20 0001      Self-Care   Self-Care Other Self-Care Comments    Other Self-Care Comments  Exam findings and possible etiology of symptoms, POC update      Exercises   Exercises  Shoulder      Shoulder Exercises: Seated   External Rotation 10 reps   2 sets   Theraband Level (Shoulder External Rotation) Level 2 (Red)     External Rotation Limitations double er + scap retraction      Shoulder Exercises: Standing   Row 10 reps   2 sets   Theraband Level (Shoulder Row) Level 3 (Green)    Other Standing Exercises Bicep curl with green 2 x 10      Shoulder Exercises: Stretch   Other Shoulder Stretches Thoracic extension/shoulder elevation step back stretch at counter 5 x 10 sec                  PT Education - 12/08/20 1005    Education Details POC and HEP update, FOTO    Person(s) Educated Patient    Methods Explanation;Demonstration;Verbal cues;Handout    Comprehension Verbalized understanding;Need further instruction;Returned demonstration;Verbal cues required            PT Short Term Goals - 12/08/20 1009      PT SHORT TERM GOAL #1   Title Patient will be I with initial HEP to progress with PT    Status Achieved      PT SHORT TERM GOAL #2   Title PT will review FOTO with patient by 3rd visit    Status Achieved      PT SHORT TERM GOAL #3   Title Patient will exhibit improved right shoulder elevation >/= 160 deg to improve reaching into high cabinets    Baseline 155 deg    Time 3    Period Weeks    Status New    Target Date 12/29/20             PT Long Term Goals - 12/08/20 1009      PT LONG TERM GOAL #1   Title Patient will be I with final HEP to maintain progress from PT    Baseline Continuing to update final HEP    Time 6    Period Weeks    Status On-going    Target Date 01/19/21      PT LONG TERM GOAL #2   Title Patient will demonstrate improved hip strength >/= 4/5 MMT and knee and ankle strength = 5/5 MMT to improve walking tolerance    Time 6    Period Weeks    Status On-going    Target Date 01/19/21      PT LONG TERM GOAL #3   Title Patient will be able to sit and walk >/= 30 minutes without need for change of position    Baseline Patient reports he can sit > 30 minutes comforably    Time --    Period --    Status Achieved      PT LONG TERM GOAL #4    Title Patient will report </= 4/10 pain level with activity to improve mobility and activity level    Baseline Patient reports right shoulder pain 4/10 at rest, worse with activity    Time 6    Period Weeks    Status On-going    Target Date 01/19/21      PT LONG TERM GOAL #5   Title Patient will report improved functional status >/= 54% functional status on FOTO    Baseline 51% functional status    Time 6    Period Weeks    Status On-going    Target Date 01/19/21  Plan - 12/08/20 1047    Clinical Impression Statement Patient tolerated therapy well with no adverse effects. Patient reports improved in low back, knee, and ankle pain this visit but is continuing to have persistent right shoulder pain. Patient does exhibit slight deficit with right shoulder AROM and strength compared to the left with pain anteriorly that seems LH bicep tendon vs rotator cuff tendinopathy. Patient does exhibit improved hip and LE strength with improved tolerance for sitting tasks. Updated HEP to include shoulder strengthening and will update POC to include right shoulder for further treatment. He would benefit from continued skilled PT to progress his mobility and strength to reduce pain and maximize functional ability.    Examination-Activity Limitations Locomotion Level;Sit;Squat;Stairs;Stand;Lift;Bend;Reach Overhead    Examination-Participation Restrictions Meal Prep;Cleaning;Community Activity;Yard Work    PT Frequency 1x / week    PT Duration 6 weeks    PT Treatment/Interventions ADLs/Self Care Home Management;Cryotherapy;Electrical Stimulation;Iontophoresis 4mg /ml Dexamethasone;Moist Heat;Traction;Ultrasound;Neuromuscular re-education;Balance training;Therapeutic exercise;Therapeutic activities;Functional mobility training;Stair training;Gait training;Patient/family education;Manual techniques;Dry needling;Passive range of motion;Taping;Spinal Manipulations;Joint  Manipulations;Vasopneumatic Device    PT Next Visit Plan Review HEP and progress PRN, manual/stretching for low back/hips/hamstrings/quads, progress right shoulder and core/hip strengthening, general BLE strengthening progress to closed chain as able    PT Home Exercise Plan [email or web address]    Consulted and Agree with Plan of Care Patient           Patient will benefit from skilled therapeutic intervention in order to improve the following deficits and impairments:  Decreased range of motion,Postural dysfunction,Decreased strength,Pain,Decreased activity tolerance,Impaired flexibility  Visit Diagnosis: Chronic right shoulder pain  Chronic bilateral low back pain, unspecified whether sciatica present  Chronic pain of left knee  Chronic pain of right knee  Pain in left ankle and joints of left foot  Pain in right ankle and joints of right foot  Muscle weakness (generalized)  Other abnormalities of gait and mobility     Problem List Patient Active Problem List   Diagnosis Date Noted  . ESRD (end stage renal disease) on dialysis ([place])   . Hypertension associated with stage 5 chronic kidney disease due to type 2 diabetes mellitus ([place]) 05/24/2020  . Hyperphosphatemia 05/24/2020  . Proteinuria 05/24/2020  . Legally blind 05/24/2020  . Diabetic retinopathy of both eyes ([place]) 05/24/2020  . Pneumonia due to COVID-19 virus 05/23/2020  . Chronic diastolic heart failure ([place]) 10/24/2018  . 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 Mitral regurgitation, Moderate 09/05/2018  . ED (erectile dysfunction) 08/10/2017  . Chronic kidney disease, stage 5 ([place]) 07/18/2017  . Graves disease 03/02/2016  . Non compliance with medical treatment 11/26/2015  . Uncontrolled hypertension 11/26/2015  . Acute on chronic renal failure ([place]) 11/25/2015  . DM (diabetes mellitus), type 2 with ophthalmic complications ([place]) 09/38/1829    Hilda Blades, PT, DPT, LAT, ATC 12/08/20  11:16 AM Phone:  [phone] Fax: [facility] [address] [place], [place], [address] Phone: [phone]   Fax:  [phone]  Name: THEOPLIS GARCIAGARCIA Sr. MRN: [id number] Date of Birth: 05-06-1971   Referring diagnosis? M25.50 Treatment diagnosis? (if different than referring diagnosis) M25.511 What was this (referring dx) caused by? []  Surgery []  Fall [x]  Ongoing issue []  Arthritis []  Other: ____________  Laterality: []  Rt []  Lt [x]  Both  Check all possible CPT codes:      []  97110 (Therapeutic Exercise)  []  92507 (SLP Treatment)  []  97112 (Neuro Re-ed)   []  92526 (Swallowing Treatment)   []  97116 (  Gait Training)   []  [id number] (Cognitive Training, 1st 15 minutes) []  97140 (Manual Therapy)   []  97130 (Cognitive Training, each add'l 15 minutes)  []  97530 (Therapeutic Activities)  []  Other, List CPT Code ____________    []  97535 (Self Care)       [x]  All codes above (97110 - 97535)  []  97012 (Mechanical Traction)  [x]  97014 (E-stim Unattended)  []  97032 (E-stim manual)  [x]  97033 (Ionto)  [x]  53646 (Ultrasound)  []  97760 (Orthotic Fit) []  [id number] (Physical Performance Training) []  [id number] (Aquatic Therapy) []  97034 (Contrast Bath) []  [id number] (Paraffin) []  97597 (Wound Care 1st 20 sq cm) []  97598 (Wound Care each add'l 20 sq cm) []  97016 (Vasopneumatic Device) []  [phone] Comptroller) []  [phone] (Prosthetic Training)

## 2020-12-08 NOTE — Patient Instructions (Signed)
Access Code: [email or web address] URL: [email or web address] Date: 12/08/2020 Prepared by: Hilda Blades  Exercises Supine Lower Trunk Rotation - 2 x daily - 7 x weekly - 5 reps - 10 seconds hold Supine Piriformis Stretch with Foot on Ground - 2 x daily - 7 x weekly - 2 reps - 30 seconds hold Sidelying Thoracic Rotation - 2 x daily - 7 x weekly - 5 reps - 5 seconds hold Thomas Stretch on Table - 2 x daily - 7 x weekly - 2 reps - 30 seconds hold Seated Hamstring Stretch - 2 x daily - 7 x weekly - 2 reps - 30 seconds hold Supine Bridge - 1 x daily - 7 x weekly - 3 sets - 5 reps Supine Active Straight Leg Raise - 1 x daily - 7 x weekly - 2 sets - 10 reps Step Back Shoulder Stretch with Chair - 2 x daily - 7 x weekly - 5 reps - 10 seconds hold Shoulder External Rotation and Scapular Retraction with Resistance - 1 x daily - 7 x weekly - 2 sets - 10 reps Banded Row - 1 x daily - 7 x weekly - 2 sets - 10 reps Standing Single Arm Elbow Flexion with Resistance - 1 x daily - 7 x weekly - 2 sets - 10 reps

## 2020-12-09 DIAGNOSIS — N186 End stage renal disease: Secondary | ICD-10-CM | POA: Diagnosis not present

## 2020-12-09 DIAGNOSIS — N2581 Secondary hyperparathyroidism of renal origin: Secondary | ICD-10-CM | POA: Diagnosis not present

## 2020-12-09 DIAGNOSIS — Z992 Dependence on renal dialysis: Secondary | ICD-10-CM | POA: Diagnosis not present

## 2020-12-11 DIAGNOSIS — N2581 Secondary hyperparathyroidism of renal origin: Secondary | ICD-10-CM | POA: Diagnosis not present

## 2020-12-11 DIAGNOSIS — Z992 Dependence on renal dialysis: Secondary | ICD-10-CM | POA: Diagnosis not present

## 2020-12-11 DIAGNOSIS — N186 End stage renal disease: Secondary | ICD-10-CM | POA: Diagnosis not present

## 2020-12-14 DIAGNOSIS — N186 End stage renal disease: Secondary | ICD-10-CM | POA: Diagnosis not present

## 2020-12-14 DIAGNOSIS — Z992 Dependence on renal dialysis: Secondary | ICD-10-CM | POA: Diagnosis not present

## 2020-12-14 DIAGNOSIS — N2581 Secondary hyperparathyroidism of renal origin: Secondary | ICD-10-CM | POA: Diagnosis not present

## 2020-12-15 ENCOUNTER — Other Ambulatory Visit: Payer: Self-pay | Admitting: Physician Assistant

## 2020-12-16 DIAGNOSIS — N186 End stage renal disease: Secondary | ICD-10-CM | POA: Diagnosis not present

## 2020-12-16 DIAGNOSIS — Z992 Dependence on renal dialysis: Secondary | ICD-10-CM | POA: Diagnosis not present

## 2020-12-16 DIAGNOSIS — N2581 Secondary hyperparathyroidism of renal origin: Secondary | ICD-10-CM | POA: Diagnosis not present

## 2020-12-18 DIAGNOSIS — Z992 Dependence on renal dialysis: Secondary | ICD-10-CM | POA: Diagnosis not present

## 2020-12-18 DIAGNOSIS — N2581 Secondary hyperparathyroidism of renal origin: Secondary | ICD-10-CM | POA: Diagnosis not present

## 2020-12-18 DIAGNOSIS — N186 End stage renal disease: Secondary | ICD-10-CM | POA: Diagnosis not present

## 2020-12-21 ENCOUNTER — Emergency Department (HOSPITAL_COMMUNITY)
Admission: EM | Admit: 2020-12-21 | Discharge: 2020-12-21 | Disposition: A | Discharge status: Home / Self Care | Payer: Medicare HMO | Attending: Emergency Medicine | Admitting: Emergency Medicine

## 2020-12-21 ENCOUNTER — Encounter (HOSPITAL_COMMUNITY): Payer: Self-pay

## 2020-12-21 DIAGNOSIS — Z87891 Personal history of nicotine dependence: Secondary | ICD-10-CM | POA: Diagnosis not present

## 2020-12-21 DIAGNOSIS — E039 Hypothyroidism, unspecified: Secondary | ICD-10-CM | POA: Diagnosis not present

## 2020-12-21 DIAGNOSIS — Z8616 Personal history of COVID-19: Secondary | ICD-10-CM | POA: Diagnosis not present

## 2020-12-21 DIAGNOSIS — I5032 Chronic diastolic (congestive) heart failure: Secondary | ICD-10-CM | POA: Diagnosis not present

## 2020-12-21 DIAGNOSIS — N186 End stage renal disease: Secondary | ICD-10-CM | POA: Insufficient documentation

## 2020-12-21 DIAGNOSIS — Z79899 Other long term (current) drug therapy: Secondary | ICD-10-CM | POA: Insufficient documentation

## 2020-12-21 DIAGNOSIS — I132 Hypertensive heart and chronic kidney disease with heart failure and with stage 5 chronic kidney disease, or end stage renal disease: Secondary | ICD-10-CM | POA: Diagnosis not present

## 2020-12-21 DIAGNOSIS — R197 Diarrhea, unspecified: Secondary | ICD-10-CM | POA: Diagnosis not present

## 2020-12-21 DIAGNOSIS — R0902 Hypoxemia: Secondary | ICD-10-CM | POA: Diagnosis not present

## 2020-12-21 DIAGNOSIS — R1084 Generalized abdominal pain: Secondary | ICD-10-CM | POA: Insufficient documentation

## 2020-12-21 DIAGNOSIS — R112 Nausea with vomiting, unspecified: Secondary | ICD-10-CM

## 2020-12-21 DIAGNOSIS — R109 Unspecified abdominal pain: Secondary | ICD-10-CM

## 2020-12-21 DIAGNOSIS — Z992 Dependence on renal dialysis: Secondary | ICD-10-CM | POA: Diagnosis not present

## 2020-12-21 DIAGNOSIS — R001 Bradycardia, unspecified: Secondary | ICD-10-CM | POA: Diagnosis not present

## 2020-12-21 LAB — COMPREHENSIVE METABOLIC PANEL
ALT: 14 U/L (ref 0–44)
AST: 17 U/L (ref 15–41)
Albumin: 4.9 g/dL (ref 3.5–5.0)
Alkaline Phosphatase: 98 U/L (ref 38–126)
Anion gap: 20 — ABNORMAL HIGH (ref 5–15)
BUN: 35 mg/dL — ABNORMAL HIGH (ref 6–20)
CO2: 28 mmol/L (ref 22–32)
Calcium: 10.3 mg/dL (ref 8.9–10.3)
Chloride: 96 mmol/L — ABNORMAL LOW (ref 98–111)
Creatinine, Ser: 13.35 mg/dL — ABNORMAL HIGH (ref 0.61–1.24)
GFR, Estimated: 4 mL/min — ABNORMAL LOW (ref >60)
Glucose, Bld: 141 mg/dL — ABNORMAL HIGH (ref 70–99)
Potassium: 3.8 mmol/L (ref 3.5–5.1)
Sodium: 144 mmol/L (ref 135–145)
Total Bilirubin: 0.8 mg/dL (ref 0.3–1.2)
Total Protein: 8.7 g/dL — ABNORMAL HIGH (ref 6.5–8.1)

## 2020-12-21 LAB — CBC
HCT: 33 % — ABNORMAL LOW (ref 39.0–52.0)
Hemoglobin: 11.3 g/dL — ABNORMAL LOW (ref 13.0–17.0)
MCH: 35.5 pg — ABNORMAL HIGH (ref 26.0–34.0)
MCHC: 34.2 g/dL (ref 30.0–36.0)
MCV: 103.8 fL — ABNORMAL HIGH (ref 80.0–100.0)
Platelets: 219 10*3/uL (ref 150–400)
RBC: 3.18 MIL/uL — ABNORMAL LOW (ref 4.22–5.81)
RDW: 15 % (ref 11.5–15.5)
WBC: 7.2 10*3/uL (ref 4.0–10.5)
nRBC: 0 % (ref 0.0–0.2)

## 2020-12-21 LAB — LIPASE, BLOOD: Lipase: 72 U/L — ABNORMAL HIGH (ref 11–51)

## 2020-12-21 MED ORDER — FENTANYL CITRATE (PF) 100 MCG/2ML IJ SOLN
100.0000 ug | Freq: Once | INTRAMUSCULAR | Status: AC
  Administered 2020-12-21: 100 ug via INTRAVENOUS
  Filled 2020-12-21: qty 2

## 2020-12-21 MED ORDER — HALOPERIDOL LACTATE 5 MG/ML IJ SOLN
5.0000 mg | Freq: Once | INTRAMUSCULAR | Status: AC
  Administered 2020-12-21: 5 mg via INTRAVENOUS
  Filled 2020-12-21: qty 1

## 2020-12-21 NOTE — ED Provider Notes (Signed)
[facility] DEPT Provider Note   CSN: [id number] Arrival date & time: 12/21/20  1048     History Chief Complaint  Patient presents with  . Abdominal Pain    TIERNAN SUTO Sr. is a 50 y.o. male.  50yo M w/ PMH including ESRD on HD M/W/F, CHF, Graves disease, T2DM who p/w abd pain, vomiting, and diarrhea.  Patient reports 1 day of nausea, vomiting, and diarrhea associated with generalized abdominal pain.  When asked about any blood in emesis or stool, patient states that he does not know.  He denies any chest pain or shortness of breath.  He was supposed to have dialysis this morning but was unable to do so because of his severe symptoms.  These are the same symptoms for which she has come to the ED previously and for which he is following with GI Dr. Carlean Purl. He had recent CT scan of abdomen for this pain and is supposed to have endoscopy scheduled but isn't sure whether it's scheduled yet. No alcohol or drug problems.  The history is provided by the patient.  Abdominal Pain      Past Medical History:  Diagnosis Date  . Acute on chronic renal failure ([place]) 11/25/2015  . Anemia   . Arthritis   . Blind    Left eye  . Cataract   . CHF (congestive heart failure) ([place])   . Chronic kidney disease    stage 5  . COVID-19   . Depression    situatuional  . Diabetes mellitus    Type II  . Graves disease 2014  . Headache    in past  . Hx of adenomatous and sessile serrated colonic polyps 11/21/2019  . Hyperlipidemia   . Hypertension   . Hypothyroidism   . Legally blind    right eye  . Neuropathy   . Non-compliance   . Proteinuria 05/24/2020  . Shortness of breath dyspnea    "Better since I've been taking my medications"    Patient Active Problem List   Diagnosis Date Noted  . ESRD (end stage renal disease) on dialysis ([place])   . Hypertension associated with stage 5 chronic kidney disease due to type 2 diabetes mellitus ([place]) 05/24/2020   . Hyperphosphatemia 05/24/2020  . Proteinuria 05/24/2020  . Legally blind 05/24/2020  . Diabetic retinopathy of both eyes ([place]) 05/24/2020  . Pneumonia due to COVID-19 virus 05/23/2020  . Chronic diastolic heart failure ([place]) 10/24/2018  . 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 Mitral regurgitation, Moderate 09/05/2018  . ED (erectile dysfunction) 08/10/2017  . Chronic kidney disease, stage 5 ([place]) 07/18/2017  . Graves disease 03/02/2016  . Non compliance with medical treatment 11/26/2015  . Uncontrolled hypertension 11/26/2015  . Acute on chronic renal failure ([place]) 11/25/2015  . DM (diabetes mellitus), type 2 with ophthalmic complications ([place]) 32/09/2481    Past Surgical History:  Procedure Laterality Date  . AV FISTULA PLACEMENT Right 04/22/2019   Procedure: RIGHT ARM ARTERIOVENOUS (AV) FISTULA CREATION;  Surgeon: Angelia Mould, MD;  Location: [facility];  Service: Vascular;  Laterality: Right;  . CIRCUMCISION     as a child, around 10 years of age  . EYE SURGERY Bilateral    "several "  . PARS PLANA VITRECTOMY Right 03/25/2016   Procedure: PARS PLANA VITRECTOMY 25 GAUGE FOR ENDOPHTHALMITIS;  Surgeon: Jalene Mullet, MD;  Location: [facility];  Service: Ophthalmology;  Laterality: Right;  . WISDOM TOOTH EXTRACTION  Family History  Problem Relation Age of Onset  . Hypertension Mother   . Diabetes Mother   . Bladder Cancer Brother   . Kidney disease Other   . Colon cancer Neg Hx   . Rectal cancer Neg Hx     Social History   Tobacco Use  . Smoking status: Former Smoker    Packs/day: 0.75    Years: 3.00    Pack years: 2.25    Types: Cigarettes    Quit date: 05/28/2000    Years since quitting: 20.5  . Smokeless tobacco: Never Used  Vaping Use  . Vaping Use: Never used  Substance Use Topics  . Alcohol use: Yes    Alcohol/week: 0.0 standard drinks    Comment: 1 beer  ocassional  . Drug use: Yes    Types: Marijuana    Comment: daily for pain    Home  Medications Prior to Admission medications   Medication Sig Start Date End Date Taking? Authorizing Provider  Accu-Chek Softclix Lancets lancets Use as instructed 08/21/20   Philemon Kingdom, MD  acetaminophen (TYLENOL) 500 MG tablet Take 500 mg by mouth every 6 (six) hours as needed.    [provider]  Alcohol Swabs (B-D SINGLE USE SWABS REGULAR) PADS Use as directed. 01/09/20   [name], MD  atorvastatin (LIPITOR) 40 MG tablet Take 1 tablet (40 mg total) by mouth daily. 10/22/19   [name], MD  Blood Glucose Monitoring Suppl (ACCU-CHEK GUIDE ME) w/Device KIT Use 3 times daily before meals 08/21/20   Philemon Kingdom, MD  diclofenac Sodium (VOLTAREN) 1 % GEL Apply 4 g topically 4 (four) times daily. 09/15/20   [name], MD  Emollient [facility] ADVANCED REPAIR) CREA Apply to dry skin as needed 07/17/20   Gildardo Pounds, NP  furosemide (LASIX) 80 MG tablet Take 80 mg by mouth 2 (two) times daily. 09/02/20   [provider]  gabapentin (NEURONTIN) 300 MG capsule Take 1 capsule (300 mg total) by mouth at bedtime. 09/15/20 12/14/20  [name], MD  glucose blood (ACCU-CHEK GUIDE) test strip Use as instructed 08/21/20   Philemon Kingdom, MD  glycopyrrolate (ROBINUL) 2 MG tablet TAKE 1 TABLET(2 MG) BY MOUTH TWICE DAILY 12/15/20   Esterwood, Amy S, PA-C  ketoconazole (NIZORAL) 2 % cream Apply 1 application topically daily. 08/11/20   Criselda Peaches, DPM  levothyroxine (SYNTHROID) 137 MCG tablet TAKE 1 TABLET(137 MCG) BY MOUTH DAILY BEFORE BREAKFAST 08/21/20   Philemon Kingdom, MD  metolazone (ZAROXOLYN) 5 MG tablet  04/05/20   [provider]  omeprazole (PRILOSEC) 20 MG capsule Take 2 capsules (40 mg total) by mouth 2 (two) times daily before a meal. 11/14/20 12/14/20  Badalamente, Rudell Cobb, PA-C  oxycodone (OXY-IR) 5 MG capsule Take 1 capsule (5 mg total) by mouth every 4 (four) hours as needed for pain. 11/19/20   Gatha Mayer, MD  promethazine  (PHENERGAN) 25 MG tablet Take 1 tablet (25 mg total) by mouth every 6 (six) hours as needed for nausea or vomiting. 11/19/20   Gatha Mayer, MD  SENSIPAR 30 MG tablet Take 30 mg by mouth daily. 09/22/20   [provider]  sevelamer carbonate (RENVELA) 800 MG tablet Take 1 tablet (800 mg total) by mouth 3 (three) times daily with meals. 05/29/20   Daisy Floro, DO  sucralfate (CARAFATE) 1 g tablet Take 1 tablet (1 g total) by mouth 4 (four) times daily -  with meals and at bedtime. 11/14/20  12/14/20  Loni Beckwith, PA-C    Allergies    Patient has no known allergies.  Review of Systems   Review of Systems  Gastrointestinal: Positive for abdominal pain.   All other systems reviewed and are negative except that which was mentioned in HPI  Physical Exam Updated Vital Signs BP 113/78   Pulse 68   Temp 98.7 F (37.1 C)   Resp 11   SpO2 95%   Physical Exam Vitals and nursing note reviewed.  Constitutional:      General: He is in acute distress.     Appearance: Normal appearance.     Comments: Screaming in pain, on left side  HENT:     Head: Normocephalic and atraumatic.  Eyes:     Conjunctiva/sclera: Conjunctivae normal.  Cardiovascular:     Rate and Rhythm: Normal rate and regular rhythm.     Heart sounds: Normal heart sounds. No murmur heard.   Pulmonary:     Effort: Pulmonary effort is normal.     Breath sounds: Normal breath sounds.  Abdominal:     General: Abdomen is flat. Bowel sounds are normal. There is no distension.     Tenderness: There is generalized abdominal tenderness.     Comments: Exam limited due to patient not cooperative, generalized TTP with some voluntary guarding  Musculoskeletal:     Right lower leg: No edema.     Left lower leg: No edema.  Skin:    General: Skin is warm and dry.  Neurological:     Mental Status: He is alert and oriented to person, place, and time.     Comments: fluent  Psychiatric:        Mood and Affect:  Mood is anxious.     Comments: distressed     ED Results / Procedures / Treatments   Labs (all labs ordered are listed, but only abnormal results are displayed) Labs Reviewed  LIPASE, BLOOD - Abnormal; Notable for the following components:      Result Value   Lipase 72 (*)    All other components within normal limits  COMPREHENSIVE METABOLIC PANEL - Abnormal; Notable for the following components:   Chloride 96 (*)    Glucose, Bld 141 (*)    BUN 35 (*)    Creatinine, Ser 13.35 (*)    Total Protein 8.7 (*)    GFR, Estimated 4 (*)    Anion gap 20 (*)    All other components within normal limits  CBC - Abnormal; Notable for the following components:   RBC 3.18 (*)    Hemoglobin 11.3 (*)    HCT 33.0 (*)    MCV 103.8 (*)    MCH 35.5 (*)    All other components within normal limits  URINALYSIS, ROUTINE W REFLEX MICROSCOPIC    EKG None  Radiology No results found.  Procedures Procedures   Medications Ordered in ED Medications  haloperidol lactate (HALDOL) injection 5 mg (5 mg Intravenous Given 12/21/20 1201)  fentaNYL (SUBLIMAZE) injection 100 mcg (100 mcg Intravenous Given 12/21/20 1200)    ED Course  I have reviewed the triage vital signs and the nursing notes.  Pertinent labs & imaging results that were available during my care of the patient were reviewed by me and considered in my medical decision making (see chart for details).    MDM Rules/Calculators/A&P  Pt distressed and moaning, yelling on exam. States symptoms are the same sx for which he is following w/ GI. According to recent note, CTA negative for mesenteric ischemia or other acute process, plan for endoscopy to look for PUD. Labs today show K 3.8, lipase 72 similar to previous, normal WBC, stable Hgb. Pt improved after above medications. Tolerating sprite and improved on reassessment. Given recent CT of abdomen and GI f/u, I do not feel he needs further imaging at this time. Have  discussed GI f/u and reviewed return precautions. PT has antiemetics at home. Final Clinical Impression(s) / ED Diagnoses Final diagnoses:  Nausea vomiting and diarrhea  Abdominal pain, unspecified abdominal location    Rx / DC Orders ED Discharge Orders    None       Khylon Davies, Wenda Overland, MD 12/21/20 [phone]

## 2020-12-21 NOTE — ED Triage Notes (Signed)
Pt presents via EMS from home with c/o abdominal pain with N/V/D for one day.

## 2020-12-22 ENCOUNTER — Ambulatory Visit: Payer: Medicare HMO | Admitting: Physical Therapy

## 2020-12-23 DIAGNOSIS — N2581 Secondary hyperparathyroidism of renal origin: Secondary | ICD-10-CM | POA: Diagnosis not present

## 2020-12-23 DIAGNOSIS — Z992 Dependence on renal dialysis: Secondary | ICD-10-CM | POA: Diagnosis not present

## 2020-12-23 DIAGNOSIS — N186 End stage renal disease: Secondary | ICD-10-CM | POA: Diagnosis not present

## 2020-12-25 DIAGNOSIS — N186 End stage renal disease: Secondary | ICD-10-CM | POA: Diagnosis not present

## 2020-12-25 DIAGNOSIS — Z992 Dependence on renal dialysis: Secondary | ICD-10-CM | POA: Diagnosis not present

## 2020-12-25 DIAGNOSIS — N2581 Secondary hyperparathyroidism of renal origin: Secondary | ICD-10-CM | POA: Diagnosis not present

## 2020-12-27 DIAGNOSIS — J9621 Acute and chronic respiratory failure with hypoxia: Secondary | ICD-10-CM | POA: Diagnosis not present

## 2020-12-27 DIAGNOSIS — U071 COVID-19: Secondary | ICD-10-CM | POA: Diagnosis not present

## 2020-12-28 DIAGNOSIS — Z992 Dependence on renal dialysis: Secondary | ICD-10-CM | POA: Diagnosis not present

## 2020-12-28 DIAGNOSIS — N2581 Secondary hyperparathyroidism of renal origin: Secondary | ICD-10-CM | POA: Diagnosis not present

## 2020-12-28 DIAGNOSIS — N186 End stage renal disease: Secondary | ICD-10-CM | POA: Diagnosis not present

## 2020-12-29 ENCOUNTER — Ambulatory Visit (AMBULATORY_SURGERY_CENTER): Payer: Medicare HMO | Admitting: *Deleted

## 2020-12-29 ENCOUNTER — Other Ambulatory Visit: Payer: Self-pay

## 2020-12-29 VITALS — Ht 72.0 in | Wt 200.0 lb

## 2020-12-29 DIAGNOSIS — R1033 Periumbilical pain: Secondary | ICD-10-CM

## 2020-12-29 DIAGNOSIS — R634 Abnormal weight loss: Secondary | ICD-10-CM

## 2020-12-29 DIAGNOSIS — R748 Abnormal levels of other serum enzymes: Secondary | ICD-10-CM

## 2020-12-29 NOTE — Progress Notes (Signed)
Pt's previsit is done over the phone and all paperwork (prep instructions, blank consent form to just read over, pre-procedure acknowledgement form and stamped envelope) sent to patient    No trouble with anesthesia, denies trouble moving neck, or hx/fam hx of malignant hyperthermia per pt    No egg or soy allergy  No home oxygen use   No medications for weight loss taken

## 2020-12-30 DIAGNOSIS — N186 End stage renal disease: Secondary | ICD-10-CM | POA: Diagnosis not present

## 2020-12-30 DIAGNOSIS — Z992 Dependence on renal dialysis: Secondary | ICD-10-CM | POA: Diagnosis not present

## 2020-12-30 DIAGNOSIS — N2581 Secondary hyperparathyroidism of renal origin: Secondary | ICD-10-CM | POA: Diagnosis not present

## 2020-12-31 ENCOUNTER — Ambulatory Visit: Payer: Medicare HMO | Admitting: Physical Therapy

## 2020-12-31 ENCOUNTER — Telehealth: Payer: Self-pay | Admitting: Physical Therapy

## 2020-12-31 DIAGNOSIS — Z992 Dependence on renal dialysis: Secondary | ICD-10-CM | POA: Diagnosis not present

## 2020-12-31 DIAGNOSIS — E1129 Type 2 diabetes mellitus with other diabetic kidney complication: Secondary | ICD-10-CM | POA: Diagnosis not present

## 2020-12-31 DIAGNOSIS — N186 End stage renal disease: Secondary | ICD-10-CM | POA: Diagnosis not present

## 2020-12-31 NOTE — Telephone Encounter (Signed)
Attempted to contact patient due to missed PT appointment. Left VM informing patient of missed appointment and next scheduled appointment on 01/05/2021. Reminded patient of attendance policy and to call if need to reschedule/cancel upcoming appointments.   Hilda Blades, PT, DPT, LAT, ATC 12/31/20  2:27 PM Phone: [phone] Fax: [phone]

## 2021-01-01 DIAGNOSIS — Z992 Dependence on renal dialysis: Secondary | ICD-10-CM | POA: Diagnosis not present

## 2021-01-01 DIAGNOSIS — N186 End stage renal disease: Secondary | ICD-10-CM | POA: Diagnosis not present

## 2021-01-01 DIAGNOSIS — N2581 Secondary hyperparathyroidism of renal origin: Secondary | ICD-10-CM | POA: Diagnosis not present

## 2021-01-04 DIAGNOSIS — N2581 Secondary hyperparathyroidism of renal origin: Secondary | ICD-10-CM | POA: Diagnosis not present

## 2021-01-04 DIAGNOSIS — N186 End stage renal disease: Secondary | ICD-10-CM | POA: Diagnosis not present

## 2021-01-04 DIAGNOSIS — Z992 Dependence on renal dialysis: Secondary | ICD-10-CM | POA: Diagnosis not present

## 2021-01-05 ENCOUNTER — Ambulatory Visit: Payer: Medicare HMO | Attending: Family Medicine | Admitting: Physical Therapy

## 2021-01-05 ENCOUNTER — Other Ambulatory Visit: Payer: Self-pay

## 2021-01-05 ENCOUNTER — Encounter: Payer: Self-pay | Admitting: Physical Therapy

## 2021-01-05 DIAGNOSIS — M6281 Muscle weakness (generalized): Secondary | ICD-10-CM | POA: Diagnosis not present

## 2021-01-05 DIAGNOSIS — G8929 Other chronic pain: Secondary | ICD-10-CM

## 2021-01-05 DIAGNOSIS — M545 Low back pain, unspecified: Secondary | ICD-10-CM | POA: Diagnosis not present

## 2021-01-05 DIAGNOSIS — M25571 Pain in right ankle and joints of right foot: Secondary | ICD-10-CM

## 2021-01-05 DIAGNOSIS — M25562 Pain in left knee: Secondary | ICD-10-CM | POA: Diagnosis not present

## 2021-01-05 DIAGNOSIS — M25572 Pain in left ankle and joints of left foot: Secondary | ICD-10-CM

## 2021-01-05 DIAGNOSIS — M25561 Pain in right knee: Secondary | ICD-10-CM | POA: Insufficient documentation

## 2021-01-05 DIAGNOSIS — R2689 Other abnormalities of gait and mobility: Secondary | ICD-10-CM

## 2021-01-05 DIAGNOSIS — M25511 Pain in right shoulder: Secondary | ICD-10-CM | POA: Diagnosis not present

## 2021-01-05 NOTE — Therapy (Addendum)
[address] [place], [place], [address] Phone: [phone]   Fax:  [phone]  Physical Therapy Treatment / Discharge  Patient Details  Name: Edward GILMER Sr. MRN: [id number] Date of Birth: 12/12/1970 Referring Provider (PT): [name], MD   Encounter Date: 01/05/2021   PT End of Session - 01/05/21 0918    Visit Number 4    Number of Visits 9    Date for PT Re-Evaluation 01/19/21    Authorization Type [employer] MEDICARE    Authorization Time Period 10/08/20 - 12/10/20    Progress Note Due on Visit 10    PT Start Time 0917    PT Stop Time 0958    PT Time Calculation (min) 41 min    Activity Tolerance Patient tolerated treatment well    Behavior During Therapy [facility] for tasks assessed/performed           Past Medical History:  Diagnosis Date  . Acute on chronic renal failure ([place]) 11/25/2015  . Anemia   . Arthritis   . Blind    Left eye  . Cataract   . CHF (congestive heart failure) ([place])   . Chronic kidney disease    stage 5  . COVID-19   . Depression    situatuional  . Diabetes mellitus    Type II  . GERD (gastroesophageal reflux disease)   . Graves disease 2014  . Headache    in past  . Hx of adenomatous and sessile serrated colonic polyps 11/21/2019  . Hyperlipidemia   . Hypertension   . Hypothyroidism   . Legally blind    right eye  . Neuropathy   . Non-compliance   . Proteinuria 05/24/2020  . Shortness of breath dyspnea    "Better since I've been taking my medications"    Past Surgical History:  Procedure Laterality Date  . AV FISTULA PLACEMENT Right 04/22/2019   Procedure: RIGHT ARM ARTERIOVENOUS (AV) FISTULA CREATION;  Surgeon: Angelia Mould, MD;  Location: [place];  Service: Vascular;  Laterality: Right;  . CIRCUMCISION     as a child, around 31 years of age  . COLONOSCOPY    . EYE SURGERY Bilateral    "several "  . PARS PLANA VITRECTOMY Right 03/25/2016   Procedure:  PARS PLANA VITRECTOMY 25 GAUGE FOR ENDOPHTHALMITIS;  Surgeon: Jalene Mullet, MD;  Location: [place];  Service: Ophthalmology;  Laterality: Right;  . WISDOM TOOTH EXTRACTION      There were no vitals filed for this visit.   Subjective Assessment - 01/05/21 0923    Subjective Patient reports he is still doing well but shoulder continues to give him some trouble. He has been doing some of the exercises, not as much as needed. He has been busy the past few weeks.    Patient Stated Goals Get feeling better and moving better    Currently in Pain? Yes    Pain Score 4     Pain Location Shoulder    Pain Orientation Right    Pain Descriptors / Indicators Sore;Dull;Aching    Pain Type Chronic pain    Pain Onset More than a month ago    Pain Frequency Constant    Aggravating Factors  Using right arm, constant pain              [facility] PT Assessment - 01/05/21 0001      AROM   Right Shoulder Flexion 158 Degrees  [place] Adult PT Treatment/Exercise - 01/05/21 0001      Exercises   Exercises Shoulder      Shoulder Exercises: Supine   Protraction 12 reps   2 sets   Protraction Weight (lbs) 5   dowel   Horizontal ABduction 10 reps   2 sets   Theraband Level (Shoulder Horizontal ABduction) Level 2 (Red)    Flexion 10 reps   2 sets   Shoulder Flexion Weight (lbs) 5   dowel   Diagonals 10 reps   2 sets   Theraband Level (Shoulder Diagonals) Level 2 (Red)      Shoulder Exercises: Standing   External Rotation 15 reps   2 sets   Theraband Level (Shoulder External Rotation) Level 3 (Green)    Internal Rotation 15 reps   2 sets   Theraband Level (Shoulder Internal Rotation) Level 3 (Green)    Extension 15 reps   2 sets   Theraband Level (Shoulder Extension) Level 3 (Green)    Row 15 reps   2 sets   Theraband Level (Shoulder Row) Level 3 (Green)      Shoulder Exercises: ROM/Strengthening   Nustep L5 x 5 min with UE/LE                  PT  Education - 01/05/21 0918    Education Details HEP    Person(s) Educated Patient    Methods Explanation    Comprehension Verbalized understanding;Need further instruction            PT Short Term Goals - 01/05/21 1000      PT SHORT TERM GOAL #1   Title Patient will be I with initial HEP to progress with PT    Status Achieved      PT SHORT TERM GOAL #2   Title PT will review FOTO with patient by 3rd visit    Status Achieved      PT SHORT TERM GOAL #3   Title Patient will exhibit improved right shoulder elevation >/= 160 deg to improve reaching into high cabinets    Baseline 158 deg    Time 3    Period Weeks    Status On-going    Target Date 12/29/20             PT Long Term Goals - 12/08/20 1009      PT LONG TERM GOAL #1   Title Patient will be I with final HEP to maintain progress from PT    Baseline Continuing to update final HEP    Time 6    Period Weeks    Status On-going    Target Date 01/19/21      PT LONG TERM GOAL #2   Title Patient will demonstrate improved hip strength >/= 4/5 MMT and knee and ankle strength = 5/5 MMT to improve walking tolerance    Time 6    Period Weeks    Status On-going    Target Date 01/19/21      PT LONG TERM GOAL #3   Title Patient will be able to sit and walk >/= 30 minutes without need for change of position    Baseline Patient reports he can sit > 30 minutes comforably    Time --    Period --    Status Achieved      PT LONG TERM GOAL #4   Title Patient will report </= 4/10 pain level with activity to improve mobility and activity level  Baseline Patient reports right shoulder pain 4/10 at rest, worse with activity    Time 6    Period Weeks    Status On-going    Target Date 01/19/21      PT LONG TERM GOAL #5   Title Patient will report improved functional status >/= 54% functional status on FOTO    Baseline 51% functional status    Time 6    Period Weeks    Status On-going    Target Date 01/19/21                  Plan - 01/05/21 0919    Clinical Impression Statement Patient tolerated therapy well with no adverse effects. Therapy focused on right shoulder strengthening this visit with good tolerance. He continues to exhibit slight right shoulder strength and AROM deficit. No change to current HEP, encouraged patient in exercise consistency. He would benefit from continued skilled PT to progress his mobility and strength to reduce pain and maximize functional ability.    PT Treatment/Interventions ADLs/Self Care Home Management;Cryotherapy;Electrical Stimulation;Iontophoresis 35m/ml Dexamethasone;Moist Heat;Traction;Ultrasound;Neuromuscular re-education;Balance training;Therapeutic exercise;Therapeutic activities;Functional mobility training;Stair training;Gait training;Patient/family education;Manual techniques;Dry needling;Passive range of motion;Taping;Spinal Manipulations;Joint Manipulations;Vasopneumatic Device    PT Next Visit Plan Review HEP and progress PRN, manual/stretching for low back/hips/hamstrings/quads, progress right shoulder and core/hip strengthening, general BLE strengthening progress to closed chain as able    PT Home Exercise Plan [id number]    Consulted and Agree with Plan of Care Patient           Patient will benefit from skilled therapeutic intervention in order to improve the following deficits and impairments:  Decreased range of motion,Postural dysfunction,Decreased strength,Pain,Decreased activity tolerance,Impaired flexibility  Visit Diagnosis: Chronic right shoulder pain  Chronic bilateral low back pain, unspecified whether sciatica present  Chronic pain of left knee  Chronic pain of right knee  Pain in left ankle and joints of left foot  Pain in right ankle and joints of right foot  Muscle weakness (generalized)  Other abnormalities of gait and mobility     Problem List Patient Active Problem List   Diagnosis Date Noted  . ESRD (end stage renal  disease) on dialysis ([place]   . Hypertension associated with stage 5 chronic kidney disease due to type 2 diabetes mellitus ([place] 05/24/2020  . Hyperphosphatemia 05/24/2020  . Proteinuria 05/24/2020  . Legally blind 05/24/2020  . Diabetic retinopathy of both eyes ([place] 05/24/2020  . Pneumonia due to COVID-19 virus 05/23/2020  . Chronic diastolic heart failure ([place] 10/24/2018  . 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 Mitral regurgitation, Moderate 09/05/2018  . ED (erectile dysfunction) 08/10/2017  . Chronic kidney disease, stage 5 ([place] 07/18/2017  . Graves disease 03/02/2016  . Non compliance with medical treatment 11/26/2015  . Uncontrolled hypertension 11/26/2015  . Acute on chronic renal failure ([place] 11/25/2015  . DM (diabetes mellitus), type 2 with ophthalmic complications ([place] 031/09/1623   CHilda Blades PT, DPT, LAT, ATC 01/05/21  10:03 AM Phone: [phone]: 3[facility] [address]Phone: [phone]  Fax:  [phone] Name: Edward DEMANSr. MRN: [id number] of Birth: [birth year]  PHYSICAL THERAPY DISCHARGE SUMMARY  Visits from Start of Care: 4  Current functional level related to goals / functional outcomes: See above   Remaining deficits: See above   Education / Equipment: HEP  Plan:  Patient goals were not met. Patient is being discharged due to not returning since the last visit.  ?????    Hilda Blades, PT, DPT, LAT, ATC 02/16/21  1:39 PM Phone: [phone] Fax: [phone]

## 2021-01-06 ENCOUNTER — Emergency Department (HOSPITAL_COMMUNITY)
Admission: EM | Admit: 2021-01-06 | Discharge: 2021-01-06 | Disposition: A | Discharge status: Home / Self Care | Payer: Medicare HMO | Attending: Emergency Medicine | Admitting: Emergency Medicine

## 2021-01-06 ENCOUNTER — Encounter (HOSPITAL_COMMUNITY): Payer: Self-pay | Admitting: Student

## 2021-01-06 ENCOUNTER — Other Ambulatory Visit: Payer: Self-pay

## 2021-01-06 DIAGNOSIS — I5032 Chronic diastolic (congestive) heart failure: Secondary | ICD-10-CM | POA: Diagnosis not present

## 2021-01-06 DIAGNOSIS — R112 Nausea with vomiting, unspecified: Secondary | ICD-10-CM | POA: Insufficient documentation

## 2021-01-06 DIAGNOSIS — Z7984 Long term (current) use of oral hypoglycemic drugs: Secondary | ICD-10-CM | POA: Diagnosis not present

## 2021-01-06 DIAGNOSIS — R197 Diarrhea, unspecified: Secondary | ICD-10-CM | POA: Diagnosis not present

## 2021-01-06 DIAGNOSIS — I132 Hypertensive heart and chronic kidney disease with heart failure and with stage 5 chronic kidney disease, or end stage renal disease: Secondary | ICD-10-CM | POA: Insufficient documentation

## 2021-01-06 DIAGNOSIS — N186 End stage renal disease: Secondary | ICD-10-CM | POA: Diagnosis not present

## 2021-01-06 DIAGNOSIS — Z79899 Other long term (current) drug therapy: Secondary | ICD-10-CM | POA: Insufficient documentation

## 2021-01-06 DIAGNOSIS — Z8616 Personal history of COVID-19: Secondary | ICD-10-CM | POA: Diagnosis not present

## 2021-01-06 DIAGNOSIS — R1084 Generalized abdominal pain: Secondary | ICD-10-CM | POA: Diagnosis not present

## 2021-01-06 DIAGNOSIS — R1013 Epigastric pain: Secondary | ICD-10-CM | POA: Diagnosis not present

## 2021-01-06 DIAGNOSIS — Z87891 Personal history of nicotine dependence: Secondary | ICD-10-CM | POA: Insufficient documentation

## 2021-01-06 DIAGNOSIS — N2581 Secondary hyperparathyroidism of renal origin: Secondary | ICD-10-CM | POA: Diagnosis not present

## 2021-01-06 DIAGNOSIS — Z992 Dependence on renal dialysis: Secondary | ICD-10-CM | POA: Insufficient documentation

## 2021-01-06 DIAGNOSIS — E039 Hypothyroidism, unspecified: Secondary | ICD-10-CM | POA: Insufficient documentation

## 2021-01-06 DIAGNOSIS — R109 Unspecified abdominal pain: Secondary | ICD-10-CM | POA: Diagnosis present

## 2021-01-06 DIAGNOSIS — E1122 Type 2 diabetes mellitus with diabetic chronic kidney disease: Secondary | ICD-10-CM | POA: Insufficient documentation

## 2021-01-06 LAB — CBC WITH DIFFERENTIAL/PLATELET
Abs Immature Granulocytes: 0.03 10*3/uL (ref 0.00–0.07)
Basophils Absolute: 0 10*3/uL (ref 0.0–0.1)
Basophils Relative: 0 %
Eosinophils Absolute: 0 10*3/uL (ref 0.0–0.5)
Eosinophils Relative: 0 %
HCT: 32 % — ABNORMAL LOW (ref 39.0–52.0)
Hemoglobin: 11.1 g/dL — ABNORMAL LOW (ref 13.0–17.0)
Immature Granulocytes: 0 %
Lymphocytes Relative: 16 %
Lymphs Abs: 1.2 10*3/uL (ref 0.7–4.0)
MCH: 35.6 pg — ABNORMAL HIGH (ref 26.0–34.0)
MCHC: 34.7 g/dL (ref 30.0–36.0)
MCV: 102.6 fL — ABNORMAL HIGH (ref 80.0–100.0)
Monocytes Absolute: 0.3 10*3/uL (ref 0.1–1.0)
Monocytes Relative: 4 %
Neutro Abs: 6.1 10*3/uL (ref 1.7–7.7)
Neutrophils Relative %: 80 %
Platelets: 212 10*3/uL (ref 150–400)
RBC: 3.12 MIL/uL — ABNORMAL LOW (ref 4.22–5.81)
RDW: 15.2 % (ref 11.5–15.5)
WBC: 7.7 10*3/uL (ref 4.0–10.5)
nRBC: 0 % (ref 0.0–0.2)

## 2021-01-06 LAB — COMPREHENSIVE METABOLIC PANEL
ALT: 30 U/L (ref 0–44)
AST: 30 U/L (ref 15–41)
Albumin: 4.7 g/dL (ref 3.5–5.0)
Alkaline Phosphatase: 77 U/L (ref 38–126)
Anion gap: 15 (ref 5–15)
BUN: 17 mg/dL (ref 6–20)
CO2: 32 mmol/L (ref 22–32)
Calcium: 9.4 mg/dL (ref 8.9–10.3)
Chloride: 94 mmol/L — ABNORMAL LOW (ref 98–111)
Creatinine, Ser: 7.19 mg/dL — ABNORMAL HIGH (ref 0.61–1.24)
GFR, Estimated: 9 mL/min — ABNORMAL LOW (ref >60)
Glucose, Bld: 97 mg/dL (ref 70–99)
Potassium: 3.4 mmol/L — ABNORMAL LOW (ref 3.5–5.1)
Sodium: 141 mmol/L (ref 135–145)
Total Bilirubin: 0.9 mg/dL (ref 0.3–1.2)
Total Protein: 8.8 g/dL — ABNORMAL HIGH (ref 6.5–8.1)

## 2021-01-06 LAB — LIPASE, BLOOD: Lipase: 69 U/L — ABNORMAL HIGH (ref 11–51)

## 2021-01-06 MED ORDER — FENTANYL CITRATE (PF) 100 MCG/2ML IJ SOLN: 50.0000 ug | Freq: Once | INTRAMUSCULAR | Status: DC

## 2021-01-06 MED ORDER — PANTOPRAZOLE SODIUM 40 MG IV SOLR
40.0000 mg | Freq: Once | INTRAVENOUS | Status: AC
  Administered 2021-01-06: 40 mg via INTRAVENOUS
  Filled 2021-01-06: qty 40

## 2021-01-06 MED ORDER — HALOPERIDOL LACTATE 5 MG/ML IJ SOLN
2.0000 mg | Freq: Once | INTRAMUSCULAR | Status: AC
  Administered 2021-01-06: 2 mg via INTRAVENOUS
  Filled 2021-01-06: qty 1

## 2021-01-06 MED ORDER — FENTANYL CITRATE (PF) 100 MCG/2ML IJ SOLN
100.0000 ug | Freq: Once | INTRAMUSCULAR | Status: AC
Start: 2021-01-06 — End: 2021-01-06
  Administered 2021-01-06: 100 ug via INTRAVENOUS
  Filled 2021-01-06: qty 2

## 2021-01-06 NOTE — Discharge Instructions (Addendum)
Follow-up with your gastroenterologist.  Return here as needed for any worsening symptoms.

## 2021-01-06 NOTE — ED Notes (Signed)
Patient yelling at staff and throwing himself around in bed while staff is trying to obtain his vitals.  He is yelling about needing pain medication.  This nurse educated him on the fact that the doctor will assess his need for medication however in order to do that we need some vitals in the system.

## 2021-01-06 NOTE — ED Notes (Signed)
Pt yelling from room for nurse. Pt states he would like to be d/c.

## 2021-01-06 NOTE — ED Notes (Signed)
Patient given a sandwich and cup of water per [place], EDP

## 2021-01-06 NOTE — ED Provider Notes (Signed)
[facility] DEPT Provider Note   CSN: [id number] Arrival date & time: 01/06/21  1315     History Chief Complaint  Patient presents with  . Abdominal Pain  . stomach ulcer    Edward PHERIGO Sr. is a 50 y.o. male.  Patient is a 50 year old male with a history of end-stage renal disease on dialysis Monday Wednesday Friday, CHF, Graves' disease, diabetes and chronic abdominal pain who presents with abdominal pain associated nausea vomiting diarrhea.  He is seen frequently for the same complaints.  He said his stomach started hurting again yesterday.  He says is the same type of pain he has had before.  He has had some diarrhea associated nausea and vomiting.  He does not report any blood in his stool.  No known fevers.  He is followed by Dr. Arelia Longest with gastroenterology.  He had a recent CTA to rule out mesenteric ischemia on March 1 of this year.  He is scheduled to have an endoscopy on April 19.  He is also have prior CT scans that have been unrevealing.  It is felt to be related to peptic ulcer disease.  He is on PPIs.  He does report that he went to dialysis this morning and completed his session.        Past Medical History:  Diagnosis Date  . Acute on chronic renal failure ([place]) 11/25/2015  . Anemia   . Arthritis   . Blind    Left eye  . Cataract   . CHF (congestive heart failure) ([place])   . Chronic kidney disease    stage 5  . COVID-19   . Depression    situatuional  . Diabetes mellitus    Type II  . GERD (gastroesophageal reflux disease)   . Graves disease 2014  . Headache    in past  . Hx of adenomatous and sessile serrated colonic polyps 11/21/2019  . Hyperlipidemia   . Hypertension   . Hypothyroidism   . Legally blind    right eye  . Neuropathy   . Non-compliance   . Proteinuria 05/24/2020  . Shortness of breath dyspnea    "Better since I've been taking my medications"    Patient Active Problem List   Diagnosis Date  Noted  . ESRD (end stage renal disease) on dialysis ([place])   . Hypertension associated with stage 5 chronic kidney disease due to type 2 diabetes mellitus ([place]) 05/24/2020  . Hyperphosphatemia 05/24/2020  . Proteinuria 05/24/2020  . Legally blind 05/24/2020  . Diabetic retinopathy of both eyes ([employer]) 05/24/2020  . Pneumonia due to COVID-19 virus 05/23/2020  . Chronic diastolic heart failure ([place]) 10/24/2018  . 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 Mitral regurgitation, Moderate 09/05/2018  . ED (erectile dysfunction) 08/10/2017  . Chronic kidney disease, stage 5 ([place]) 07/18/2017  . Graves disease 03/02/2016  . Non compliance with medical treatment 11/26/2015  . Uncontrolled hypertension 11/26/2015  . Acute on chronic renal failure ([place]) 11/25/2015  . DM (diabetes mellitus), type 2 with ophthalmic complications ([place]) 93/71/6967    Past Surgical History:  Procedure Laterality Date  . AV FISTULA PLACEMENT Right 04/22/2019   Procedure: RIGHT ARM ARTERIOVENOUS (AV) FISTULA CREATION;  Surgeon: Angelia Mould, MD;  Location: [facility];  Service: Vascular;  Laterality: Right;  . CIRCUMCISION     as a child, around 83 years of age  . COLONOSCOPY    . EYE SURGERY Bilateral    "several "  . PARS PLANA  VITRECTOMY Right 03/25/2016   Procedure: PARS PLANA VITRECTOMY 25 GAUGE FOR ENDOPHTHALMITIS;  Surgeon: Jalene Mullet, MD;  Location: [place];  Service: Ophthalmology;  Laterality: Right;  . WISDOM TOOTH EXTRACTION         Family History  Problem Relation Age of Onset  . Hypertension Mother   . Diabetes Mother   . Bladder Cancer Brother   . Kidney disease Other   . Colon cancer Neg Hx   . Rectal cancer Neg Hx   . Esophageal cancer Neg Hx   . Stomach cancer Neg Hx     Social History   Tobacco Use  . Smoking status: Former Smoker    Packs/day: 0.75    Years: 3.00    Pack years: 2.25    Types: Cigarettes    Quit date: 05/28/2000    Years since quitting: 20.6  . Smokeless  tobacco: Never Used  Vaping Use  . Vaping Use: Never used  Substance Use Topics  . Alcohol use: Not Currently    Alcohol/week: 0.0 standard drinks    Comment: 1 beer  ocassional  . Drug use: Yes    Types: Marijuana    Comment: daily for pain    Home Medications Prior to Admission medications   Medication Sig Start Date End Date Taking? Authorizing Provider  Accu-Chek Softclix Lancets lancets Use as instructed 08/21/20   Philemon Kingdom, MD  acetaminophen (TYLENOL) 500 MG tablet Take 500 mg by mouth every 6 (six) hours as needed.    [provider]  Alcohol Swabs (B-D SINGLE USE SWABS REGULAR) PADS Use as directed. 01/09/20   [name], MD  atorvastatin (LIPITOR) 40 MG tablet Take 1 tablet (40 mg total) by mouth daily. 10/22/19   [name], MD  Blood Glucose Monitoring Suppl (ACCU-CHEK GUIDE ME) w/Device KIT Use 3 times daily before meals 08/21/20   Philemon Kingdom, MD  diclofenac Sodium (VOLTAREN) 1 % GEL Apply 4 g topically 4 (four) times daily. 09/15/20   [name], MD  Emollient [facility] ADVANCED REPAIR) CREA Apply to dry skin as needed 07/17/20   Gildardo Pounds, NP  furosemide (LASIX) 80 MG tablet Take 80 mg by mouth 2 (two) times daily. 09/02/20   [provider]  gabapentin (NEURONTIN) 300 MG capsule Take 1 capsule (300 mg total) by mouth at bedtime. 09/15/20 12/14/20  [name], MD  glipiZIDE (GLUCOTROL XL) 2.5 MG 24 hr tablet Take by mouth daily.    [provider]  glucose blood (ACCU-CHEK GUIDE) test strip Use as instructed 08/21/20   Philemon Kingdom, MD  glycopyrrolate (ROBINUL) 2 MG tablet TAKE 1 TABLET(2 MG) BY MOUTH TWICE DAILY 12/15/20   Esterwood, Amy S, PA-C  ketoconazole (NIZORAL) 2 % cream Apply 1 application topically daily. 08/11/20   Criselda Peaches, DPM  levothyroxine (SYNTHROID) 137 MCG tablet TAKE 1 TABLET(137 MCG) BY MOUTH DAILY BEFORE BREAKFAST 08/21/20   Philemon Kingdom, MD  metolazone (ZAROXOLYN) 5 MG  tablet  04/05/20   [provider]  omeprazole (PRILOSEC) 20 MG capsule Take 2 capsules (40 mg total) by mouth 2 (two) times daily before a meal. 11/14/20 12/14/20  Badalamente, Rudell Cobb, PA-C  oxycodone (OXY-IR) 5 MG capsule Take 1 capsule (5 mg total) by mouth every 4 (four) hours as needed for pain. 11/19/20   Gatha Mayer, MD  promethazine (PHENERGAN) 25 MG tablet Take 1 tablet (25 mg total) by mouth every 6 (six) hours as needed for nausea or vomiting. 11/19/20  Gatha Mayer, MD  SENSIPAR 30 MG tablet Take 30 mg by mouth daily. 09/22/20   [provider]  sevelamer carbonate (RENVELA) 800 MG tablet Take 1 tablet (800 mg total) by mouth 3 (three) times daily with meals. 05/29/20   Daisy Floro, DO  sucralfate (CARAFATE) 1 g tablet Take 1 tablet (1 g total) by mouth 4 (four) times daily -  with meals and at bedtime. 11/14/20 12/14/20  Loni Beckwith, PA-C    Allergies    Patient has no known allergies.  Review of Systems   Review of Systems  Constitutional: Negative for chills, diaphoresis, fatigue and fever.  HENT: Negative for congestion, rhinorrhea and sneezing.   Eyes: Negative.   Respiratory: Negative for cough, chest tightness and shortness of breath.   Cardiovascular: Negative for chest pain and leg swelling.  Gastrointestinal: Positive for abdominal pain, diarrhea, nausea and vomiting. Negative for blood in stool.  Genitourinary: Negative for difficulty urinating, flank pain, frequency and hematuria.  Musculoskeletal: Negative for arthralgias and back pain.  Skin: Negative for rash.  Neurological: Negative for dizziness, speech difficulty, weakness, numbness and headaches.    Physical Exam Updated Vital Signs BP (!) 166/84   Pulse 78   Temp 98.9 F (37.2 C) (Oral)   Resp 18   SpO2 99%   Physical Exam Constitutional:      Appearance: He is well-developed.  HENT:     Head: Normocephalic and atraumatic.  Eyes:     Pupils: Pupils are equal,  round, and reactive to light.  Cardiovascular:     Rate and Rhythm: Normal rate and regular rhythm.     Heart sounds: Normal heart sounds.  Pulmonary:     Effort: Pulmonary effort is normal. No respiratory distress.     Breath sounds: Normal breath sounds. No wheezing or rales.  Chest:     Chest wall: No tenderness.  Abdominal:     General: Bowel sounds are normal.     Palpations: Abdomen is soft.     Tenderness: There is generalized abdominal tenderness. There is no guarding or rebound.     Comments: Generalized tenderness but more across the upper abdomen  Musculoskeletal:        General: Normal range of motion.     Cervical back: Normal range of motion and neck supple.  Lymphadenopathy:     Cervical: No cervical adenopathy.  Skin:    General: Skin is warm and dry.     Findings: No rash.  Neurological:     Mental Status: He is alert and oriented to person, place, and time.     ED Results / Procedures / Treatments   Labs (all labs ordered are listed, but only abnormal results are displayed) Labs Reviewed  COMPREHENSIVE METABOLIC PANEL - Abnormal; Notable for the following components:      Result Value   Potassium 3.4 (*)    Chloride 94 (*)    Creatinine, Ser 7.19 (*)    Total Protein 8.8 (*)    GFR, Estimated 9 (*)    All other components within normal limits  LIPASE, BLOOD - Abnormal; Notable for the following components:   Lipase 69 (*)    All other components within normal limits  CBC WITH DIFFERENTIAL/PLATELET - Abnormal; Notable for the following components:   RBC 3.12 (*)    Hemoglobin 11.1 (*)    HCT 32.0 (*)    MCV 102.6 (*)    MCH 35.6 (*)    All  other components within normal limits  CBC WITH DIFFERENTIAL/PLATELET    EKG None  Radiology No results found.  Procedures Procedures   Medications Ordered in ED Medications  fentaNYL (SUBLIMAZE) injection 50 mcg (0 mcg Intravenous Hold 01/06/21 1750)  fentaNYL (SUBLIMAZE) injection 100 mcg (100 mcg  Intravenous Given 01/06/21 1449)  haloperidol lactate (HALDOL) injection 2 mg (2 mg Intravenous Given 01/06/21 1449)  pantoprazole (PROTONIX) injection 40 mg (40 mg Intravenous Given 01/06/21 1616)    ED Course  I have reviewed the triage vital signs and the nursing notes.  Pertinent labs & imaging results that were available during my care of the patient were reviewed by me and considered in my medical decision making (see chart for details).    MDM Rules/Calculators/A&P                          Patient presents with abdominal pain.  He has been evaluated multiple times in the past for similar symptoms.  He had recent imaging so I did not feel that that would be beneficial today to repeat this.  His labs are nonconcerning.  His hemoglobin is stable.  His lipase is chronically elevated.  It is unchanged from his prior values.  He is feeling better after treatment in the ED.  He has medications to use at home.  I offered a p.o. challenge but he said his ride is here and he does not want to stay any longer.  He was encouraged to follow-up with Dr. Carlean Purl.  Return precautions were given. Final Clinical Impression(s) / ED Diagnoses Final diagnoses:  Epigastric pain    Rx / DC Orders ED Discharge Orders    None       Malvin Johns, MD 01/06/21 Vernelle Emerald

## 2021-01-06 NOTE — ED Notes (Signed)
Attempted to get IV on patient, he was throwing himself all over the bed causing IV to blow.  Another nurse will try.

## 2021-01-06 NOTE — ED Notes (Signed)
This nurse was made aware that patient's ride was here.  [place], EDP made aware and will come talk to patient.

## 2021-01-06 NOTE — ED Triage Notes (Addendum)
Patient BIB [facility] c/o abdominal for 2 days.  Patient has chronic abdominal pain from ulcers and is here every 2 weeks.  Patient was clammy when when EMS arrived but other wise ok.  Dialysis patient with right arm fistula, M/W/F and he received a treatment today.  Patient is yelling on the arrival to the room and he was very hostel with EMS.  Vitals 134/80 88-HR 18-RR 100%

## 2021-01-07 ENCOUNTER — Ambulatory Visit: Payer: Medicare HMO | Admitting: Internal Medicine

## 2021-01-08 DIAGNOSIS — N2581 Secondary hyperparathyroidism of renal origin: Secondary | ICD-10-CM | POA: Diagnosis not present

## 2021-01-08 DIAGNOSIS — N186 End stage renal disease: Secondary | ICD-10-CM | POA: Diagnosis not present

## 2021-01-08 DIAGNOSIS — Z992 Dependence on renal dialysis: Secondary | ICD-10-CM | POA: Diagnosis not present

## 2021-01-11 ENCOUNTER — Ambulatory Visit: Payer: Medicare HMO | Admitting: Internal Medicine

## 2021-01-11 DIAGNOSIS — N2581 Secondary hyperparathyroidism of renal origin: Secondary | ICD-10-CM | POA: Diagnosis not present

## 2021-01-11 DIAGNOSIS — Z992 Dependence on renal dialysis: Secondary | ICD-10-CM | POA: Diagnosis not present

## 2021-01-11 DIAGNOSIS — N186 End stage renal disease: Secondary | ICD-10-CM | POA: Diagnosis not present

## 2021-01-13 DIAGNOSIS — N186 End stage renal disease: Secondary | ICD-10-CM | POA: Diagnosis not present

## 2021-01-13 DIAGNOSIS — N2581 Secondary hyperparathyroidism of renal origin: Secondary | ICD-10-CM | POA: Diagnosis not present

## 2021-01-13 DIAGNOSIS — Z992 Dependence on renal dialysis: Secondary | ICD-10-CM | POA: Diagnosis not present

## 2021-01-15 DIAGNOSIS — N186 End stage renal disease: Secondary | ICD-10-CM | POA: Diagnosis not present

## 2021-01-15 DIAGNOSIS — Z992 Dependence on renal dialysis: Secondary | ICD-10-CM | POA: Diagnosis not present

## 2021-01-15 DIAGNOSIS — N2581 Secondary hyperparathyroidism of renal origin: Secondary | ICD-10-CM | POA: Diagnosis not present

## 2021-01-18 DIAGNOSIS — N186 End stage renal disease: Secondary | ICD-10-CM | POA: Diagnosis not present

## 2021-01-18 DIAGNOSIS — N2581 Secondary hyperparathyroidism of renal origin: Secondary | ICD-10-CM | POA: Diagnosis not present

## 2021-01-18 DIAGNOSIS — Z992 Dependence on renal dialysis: Secondary | ICD-10-CM | POA: Diagnosis not present

## 2021-01-19 ENCOUNTER — Encounter: Payer: Self-pay | Admitting: Internal Medicine

## 2021-01-19 ENCOUNTER — Other Ambulatory Visit: Payer: Self-pay

## 2021-01-19 ENCOUNTER — Ambulatory Visit (AMBULATORY_SURGERY_CENTER): Payer: Medicare HMO | Admitting: Internal Medicine

## 2021-01-19 VITALS — BP 108/60 | HR 71 | Temp 98.4°F | Resp 12 | Ht 72.0 in | Wt 200.0 lb

## 2021-01-19 DIAGNOSIS — R634 Abnormal weight loss: Secondary | ICD-10-CM

## 2021-01-19 DIAGNOSIS — R1033 Periumbilical pain: Secondary | ICD-10-CM | POA: Diagnosis not present

## 2021-01-19 DIAGNOSIS — K3189 Other diseases of stomach and duodenum: Secondary | ICD-10-CM | POA: Diagnosis not present

## 2021-01-19 DIAGNOSIS — K2980 Duodenitis without bleeding: Secondary | ICD-10-CM | POA: Diagnosis not present

## 2021-01-19 DIAGNOSIS — K2950 Unspecified chronic gastritis without bleeding: Secondary | ICD-10-CM | POA: Diagnosis not present

## 2021-01-19 DIAGNOSIS — R748 Abnormal levels of other serum enzymes: Secondary | ICD-10-CM

## 2021-01-19 DIAGNOSIS — K2970 Gastritis, unspecified, without bleeding: Secondary | ICD-10-CM | POA: Diagnosis not present

## 2021-01-19 MED ORDER — SODIUM CHLORIDE 0.9 % IV SOLN: 500.0000 mL | Freq: Once | INTRAVENOUS | Status: DC

## 2021-01-19 NOTE — Progress Notes (Signed)
VS-JD  Pt's states no medical or surgical changes since previsit or office visit.

## 2021-01-19 NOTE — Progress Notes (Signed)
Called to room to assist during endoscopic procedure.  Patient ID and intended procedure confirmed with present staff. Received instructions for my participation in the procedure from the performing physician.  

## 2021-01-19 NOTE — Op Note (Signed)
[facility] Patient Name: Edward [name] Procedure Date: 01/19/2021 1:49 PM MRN: [id number] Endoscopist: Gatha Mayer , MD Age: 50 Referring MD:  Date of Birth: Apr 04, 1971 Gender: Male Account #: [id number] Procedure:                Upper GI endoscopy Indications:              Periumbilical abdominal pain Medicines:                Propofol per Anesthesia, Monitored Anesthesia Care Procedure:                Pre-Anesthesia Assessment:                           - Prior to the procedure, a History and Physical                            was performed, and patient medications and                            allergies were reviewed. The patient's tolerance of                            previous anesthesia was also reviewed. The risks                            and benefits of the procedure and the sedation                            options and risks were discussed with the patient.                            All questions were answered, and informed consent                            was obtained. Prior Anticoagulants: The patient has                            taken no previous anticoagulant or antiplatelet                            agents. ASA Grade Assessment: III - A patient with                            severe systemic disease. After reviewing the risks                            and benefits, the patient was deemed in                            satisfactory condition to undergo the procedure.                           After obtaining informed consent, the endoscope was  passed under direct vision. Throughout the                            procedure, the patient's blood pressure, pulse, and                            oxygen saturations were monitored continuously. The                            Endoscope was introduced through the mouth, and                            advanced to the second part of duodenum. The upper                             GI endoscopy was accomplished without difficulty.                            The patient tolerated the procedure well. Scope In: Scope Out: Findings:                 Diffuse moderate inflammation characterized by                            erythema and granularity was found in the duodenal                            bulb and in the second portion of the duodenum.                            Biopsies were taken with a cold forceps for                            histology. Verification of patient identification                            for the specimen was done. Estimated blood loss was                            minimal.                           The exam was otherwise without abnormality.                           The cardia and gastric fundus were normal on                            retroflexion.                           Biopsies were taken with a cold forceps in the                            gastric body for histology. Verification of  patient                            identification for the specimen was done. Estimated                            blood loss was minimal. Complications:            No immediate complications. Estimated Blood Loss:     Estimated blood loss was minimal. Impression:               - Duodenitis. Biopsied.                           - The examination was otherwise normal.                           - Biopsies were taken with a cold forceps for                            histology. Recommendation:           - Patient has a contact number available for                            emergencies. The signs and symptoms of potential                            delayed complications were discussed with the                            patient. Return to normal activities tomorrow.                            Written discharge instructions were provided to the                            patient.                           - Resume previous diet.                            - Continue present medications.                           - Await pathology results. Gatha Mayer, MD 01/19/2021 2:33:31 PM This report has been signed electronically.

## 2021-01-19 NOTE — Patient Instructions (Addendum)
I found some inflammation in the upper intestine and took biopsies of that and the stomach lining.  Once I get those results back we will contact you about what to do next.  Nothing looks bad here - no signs of ulcers of cancer.  I appreciate the opportunity to care for you. Gatha Mayer, MD, [facility] Resume previous diet Continue current medications Await pathology results  YOU HAD AN ENDOSCOPIC PROCEDURE TODAY AT [place]:   Refer to the procedure report that was given to you for any specific questions about what was found during the examination.  If the procedure report does not answer your questions, please call your gastroenterologist to clarify.  If you requested that your care partner not be given the details of your procedure findings, then the procedure report has been included in a sealed envelope for you to review at your convenience later.  YOU SHOULD EXPECT: Some feelings of bloating in the abdomen. Passage of more gas than usual.  Walking can help get rid of the air that was put into your GI tract during the procedure and reduce the bloating. If you had a lower endoscopy (such as a colonoscopy or flexible sigmoidoscopy) you may notice spotting of blood in your stool or on the toilet paper. If you underwent a bowel prep for your procedure, you may not have a normal bowel movement for a few days.  Please Note:  You might notice some irritation and congestion in your nose or some drainage.  This is from the oxygen used during your procedure.  There is no need for concern and it should clear up in a day or so.  SYMPTOMS TO REPORT IMMEDIATELY:  Following upper endoscopy (EGD)  Vomiting of blood or coffee ground material  New chest pain or pain under the shoulder blades  Painful or persistently difficult swallowing  New shortness of breath  Fever of 100F or higher  Black, tarry-looking stools  For urgent or emergent issues, a gastroenterologist can be reached  at any hour by calling [phone]. Do not use MyChart messaging for urgent concerns.   DIET:  We do recommend a small meal at first, but then you may proceed to your regular diet.  Drink plenty of fluids but you should avoid alcoholic beverages for 24 hours.  ACTIVITY:  You should plan to take it easy for the rest of today and you should NOT DRIVE or use heavy machinery until tomorrow (because of the sedation medicines used during the test).    FOLLOW UP: Our staff will call the number listed on your records 48-72 hours following your procedure to check on you and address any questions or concerns that you may have regarding the information given to you following your procedure. If we do not reach you, we will leave a message.  We will attempt to reach you two times.  During this call, we will ask if you have developed any symptoms of COVID 19. If you develop any symptoms (ie: fever, flu-like symptoms, shortness of breath, cough etc.) before then, please call [phone].  If you test positive for Covid 19 in the 2 weeks post procedure, please call and report this information to [place].    If any biopsies were taken you will be contacted by phone or by letter within the next 1-3 weeks.  Please call us at [phone] if you have not heard about the biopsies in 3 weeks.   SIGNATURES/CONFIDENTIALITY: You and/or your care  partner have signed paperwork which will be entered into your electronic medical record.  These signatures attest to the fact that that the information above on your After Visit Summary has been reviewed and is understood.  Full responsibility of the confidentiality of this discharge information lies with you and/or your care-partner.

## 2021-01-19 NOTE — Progress Notes (Signed)
pt tolerated well. VSS. awake and to recovery. Report given to RN. Bite block left insitu to recovery. 

## 2021-01-20 DIAGNOSIS — N2581 Secondary hyperparathyroidism of renal origin: Secondary | ICD-10-CM | POA: Diagnosis not present

## 2021-01-20 DIAGNOSIS — Z992 Dependence on renal dialysis: Secondary | ICD-10-CM | POA: Diagnosis not present

## 2021-01-20 DIAGNOSIS — N186 End stage renal disease: Secondary | ICD-10-CM | POA: Diagnosis not present

## 2021-01-21 ENCOUNTER — Telehealth: Payer: Self-pay

## 2021-01-21 NOTE — Telephone Encounter (Signed)
  Follow up Call-  Call back number 01/19/2021 11/13/2019  Post procedure Call Back phone  # [phone]  Permission to leave phone message Yes Yes  Some recent data might be hidden     Patient questions:  Do you have a fever, pain , or abdominal swelling? No. Pain Score  0 *  Have you tolerated food without any problems? Yes.    Have you been able to return to your normal activities? Yes.    Do you have any questions about your discharge instructions: Diet   No. Medications  No. Follow up visit  No.  Do you have questions or concerns about your Care? No.  Actions: * If pain score is 4 or above: No action needed, pain <4.  1. Have you developed a fever since your procedure? no  2.   Have you had an respiratory symptoms (SOB or cough) since your procedure? no  3.   Have you tested positive for COVID 19 since your procedure no  4.   Have you had any family members/close contacts diagnosed with the COVID 19 since your procedure?  no   If yes to any of these questions please route to Joylene John, RN and Joella Prince, RN

## 2021-01-22 DIAGNOSIS — N2581 Secondary hyperparathyroidism of renal origin: Secondary | ICD-10-CM | POA: Diagnosis not present

## 2021-01-22 DIAGNOSIS — N186 End stage renal disease: Secondary | ICD-10-CM | POA: Diagnosis not present

## 2021-01-22 DIAGNOSIS — Z992 Dependence on renal dialysis: Secondary | ICD-10-CM | POA: Diagnosis not present

## 2021-01-26 ENCOUNTER — Telehealth: Payer: Self-pay | Admitting: Physical Therapy

## 2021-01-26 ENCOUNTER — Ambulatory Visit: Payer: Medicare HMO | Admitting: Physical Therapy

## 2021-01-26 NOTE — Telephone Encounter (Signed)
Spoke with patient due to missed PT appointment. Patient stated he forgot because he was in a lot of pain regarding his stomach. Patient reminded of attendance policy, and due to his recent attendance will cancel the appointment scheduled on 02/09/21 but keep the one on 02/02/2021, and he will be able to schedule one visit at a time moving forward. Patient expressed understanding.  Hilda Blades, PT, DPT, LAT, ATC 01/26/21  1:47 PM Phone: [phone] Fax: [phone]

## 2021-01-27 DIAGNOSIS — N2581 Secondary hyperparathyroidism of renal origin: Secondary | ICD-10-CM | POA: Diagnosis not present

## 2021-01-27 DIAGNOSIS — U071 COVID-19: Secondary | ICD-10-CM | POA: Diagnosis not present

## 2021-01-27 DIAGNOSIS — Z992 Dependence on renal dialysis: Secondary | ICD-10-CM | POA: Diagnosis not present

## 2021-01-27 DIAGNOSIS — J9621 Acute and chronic respiratory failure with hypoxia: Secondary | ICD-10-CM | POA: Diagnosis not present

## 2021-01-27 DIAGNOSIS — N186 End stage renal disease: Secondary | ICD-10-CM | POA: Diagnosis not present

## 2021-01-29 DIAGNOSIS — N2581 Secondary hyperparathyroidism of renal origin: Secondary | ICD-10-CM | POA: Diagnosis not present

## 2021-01-29 DIAGNOSIS — Z992 Dependence on renal dialysis: Secondary | ICD-10-CM | POA: Diagnosis not present

## 2021-01-29 DIAGNOSIS — N186 End stage renal disease: Secondary | ICD-10-CM | POA: Diagnosis not present

## 2021-01-30 DIAGNOSIS — Z992 Dependence on renal dialysis: Secondary | ICD-10-CM | POA: Diagnosis not present

## 2021-01-30 DIAGNOSIS — E1129 Type 2 diabetes mellitus with other diabetic kidney complication: Secondary | ICD-10-CM | POA: Diagnosis not present

## 2021-01-30 DIAGNOSIS — N186 End stage renal disease: Secondary | ICD-10-CM | POA: Diagnosis not present

## 2021-02-01 DIAGNOSIS — N186 End stage renal disease: Secondary | ICD-10-CM | POA: Diagnosis not present

## 2021-02-01 DIAGNOSIS — N2581 Secondary hyperparathyroidism of renal origin: Secondary | ICD-10-CM | POA: Diagnosis not present

## 2021-02-01 DIAGNOSIS — Z992 Dependence on renal dialysis: Secondary | ICD-10-CM | POA: Diagnosis not present

## 2021-02-02 ENCOUNTER — Ambulatory Visit: Payer: Medicare HMO | Admitting: Physical Therapy

## 2021-02-03 DIAGNOSIS — N186 End stage renal disease: Secondary | ICD-10-CM | POA: Diagnosis not present

## 2021-02-03 DIAGNOSIS — Z992 Dependence on renal dialysis: Secondary | ICD-10-CM | POA: Diagnosis not present

## 2021-02-03 DIAGNOSIS — N2581 Secondary hyperparathyroidism of renal origin: Secondary | ICD-10-CM | POA: Diagnosis not present

## 2021-02-05 ENCOUNTER — Other Ambulatory Visit: Payer: Self-pay

## 2021-02-05 DIAGNOSIS — N2581 Secondary hyperparathyroidism of renal origin: Secondary | ICD-10-CM | POA: Diagnosis not present

## 2021-02-05 DIAGNOSIS — Z992 Dependence on renal dialysis: Secondary | ICD-10-CM | POA: Diagnosis not present

## 2021-02-05 DIAGNOSIS — N186 End stage renal disease: Secondary | ICD-10-CM | POA: Diagnosis not present

## 2021-02-05 MED ORDER — AMITRIPTYLINE HCL 25 MG PO TABS: ORAL_TABLET | ORAL | 0 refills | Status: DC

## 2021-02-05 NOTE — Progress Notes (Signed)
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  This is difficult to treat, unfortunately.  He is already on some gabapentin prescribed by primary care.  If he is taking it.  Please prescribe amitriptyline 25 mg tablets he is to take 50 mg nightly but to start with 25 mg at bedtime for the first week  You can dispense a 53-month prescription no refills  It takes time to see if it works there is no quick fix.  Schedule follow-up with Amy Esterwood 6 to 8 weeks

## 2021-02-05 NOTE — Progress Notes (Signed)
aitrip

## 2021-02-08 DIAGNOSIS — Z992 Dependence on renal dialysis: Secondary | ICD-10-CM | POA: Diagnosis not present

## 2021-02-08 DIAGNOSIS — N186 End stage renal disease: Secondary | ICD-10-CM | POA: Diagnosis not present

## 2021-02-08 DIAGNOSIS — N2581 Secondary hyperparathyroidism of renal origin: Secondary | ICD-10-CM | POA: Diagnosis not present

## 2021-02-09 ENCOUNTER — Encounter: Payer: Medicare HMO | Admitting: Physical Therapy

## 2021-02-10 DIAGNOSIS — Z992 Dependence on renal dialysis: Secondary | ICD-10-CM | POA: Diagnosis not present

## 2021-02-10 DIAGNOSIS — N186 End stage renal disease: Secondary | ICD-10-CM | POA: Diagnosis not present

## 2021-02-10 DIAGNOSIS — N2581 Secondary hyperparathyroidism of renal origin: Secondary | ICD-10-CM | POA: Diagnosis not present

## 2021-02-12 DIAGNOSIS — N2581 Secondary hyperparathyroidism of renal origin: Secondary | ICD-10-CM | POA: Diagnosis not present

## 2021-02-12 DIAGNOSIS — N186 End stage renal disease: Secondary | ICD-10-CM | POA: Diagnosis not present

## 2021-02-12 DIAGNOSIS — Z992 Dependence on renal dialysis: Secondary | ICD-10-CM | POA: Diagnosis not present

## 2021-02-15 ENCOUNTER — Other Ambulatory Visit: Payer: Self-pay

## 2021-02-15 ENCOUNTER — Emergency Department (HOSPITAL_COMMUNITY)
Admission: EM | Admit: 2021-02-15 | Discharge: 2021-02-15 | Disposition: A | Discharge status: Home / Self Care | Payer: Medicare HMO | Attending: Emergency Medicine | Admitting: Emergency Medicine

## 2021-02-15 DIAGNOSIS — I132 Hypertensive heart and chronic kidney disease with heart failure and with stage 5 chronic kidney disease, or end stage renal disease: Secondary | ICD-10-CM | POA: Insufficient documentation

## 2021-02-15 DIAGNOSIS — R1110 Vomiting, unspecified: Secondary | ICD-10-CM | POA: Diagnosis not present

## 2021-02-15 DIAGNOSIS — R1084 Generalized abdominal pain: Secondary | ICD-10-CM

## 2021-02-15 DIAGNOSIS — Z87891 Personal history of nicotine dependence: Secondary | ICD-10-CM | POA: Diagnosis not present

## 2021-02-15 DIAGNOSIS — Z79899 Other long term (current) drug therapy: Secondary | ICD-10-CM | POA: Insufficient documentation

## 2021-02-15 DIAGNOSIS — Z8616 Personal history of COVID-19: Secondary | ICD-10-CM | POA: Diagnosis not present

## 2021-02-15 DIAGNOSIS — Z992 Dependence on renal dialysis: Secondary | ICD-10-CM | POA: Diagnosis not present

## 2021-02-15 DIAGNOSIS — N186 End stage renal disease: Secondary | ICD-10-CM | POA: Diagnosis not present

## 2021-02-15 DIAGNOSIS — Z7984 Long term (current) use of oral hypoglycemic drugs: Secondary | ICD-10-CM | POA: Diagnosis not present

## 2021-02-15 DIAGNOSIS — R197 Diarrhea, unspecified: Secondary | ICD-10-CM | POA: Insufficient documentation

## 2021-02-15 DIAGNOSIS — E039 Hypothyroidism, unspecified: Secondary | ICD-10-CM | POA: Diagnosis not present

## 2021-02-15 DIAGNOSIS — I5032 Chronic diastolic (congestive) heart failure: Secondary | ICD-10-CM | POA: Insufficient documentation

## 2021-02-15 DIAGNOSIS — R109 Unspecified abdominal pain: Secondary | ICD-10-CM | POA: Diagnosis present

## 2021-02-15 DIAGNOSIS — E1122 Type 2 diabetes mellitus with diabetic chronic kidney disease: Secondary | ICD-10-CM | POA: Diagnosis not present

## 2021-02-15 DIAGNOSIS — I10 Essential (primary) hypertension: Secondary | ICD-10-CM | POA: Diagnosis not present

## 2021-02-15 LAB — CBC
HCT: 32.1 % — ABNORMAL LOW (ref 39.0–52.0)
Hemoglobin: 10.8 g/dL — ABNORMAL LOW (ref 13.0–17.0)
MCH: 35.5 pg — ABNORMAL HIGH (ref 26.0–34.0)
MCHC: 33.6 g/dL (ref 30.0–36.0)
MCV: 105.6 fL — ABNORMAL HIGH (ref 80.0–100.0)
Platelets: 191 10*3/uL (ref 150–400)
RBC: 3.04 MIL/uL — ABNORMAL LOW (ref 4.22–5.81)
RDW: 14.6 % (ref 11.5–15.5)
WBC: 5.8 10*3/uL (ref 4.0–10.5)
nRBC: 0.3 % — ABNORMAL HIGH (ref 0.0–0.2)

## 2021-02-15 LAB — URINALYSIS, ROUTINE W REFLEX MICROSCOPIC
Bacteria, UA: NONE SEEN
Bilirubin Urine: NEGATIVE
Glucose, UA: 50 mg/dL — AB
Ketones, ur: NEGATIVE mg/dL
Leukocytes,Ua: NEGATIVE
Nitrite: NEGATIVE
Protein, ur: 100 mg/dL — AB
Specific Gravity, Urine: 1.01 (ref 1.005–1.030)
pH: 9 — ABNORMAL HIGH (ref 5.0–8.0)

## 2021-02-15 LAB — COMPREHENSIVE METABOLIC PANEL
ALT: 20 U/L (ref 0–44)
AST: 22 U/L (ref 15–41)
Albumin: 4.2 g/dL (ref 3.5–5.0)
Alkaline Phosphatase: 67 U/L (ref 38–126)
Anion gap: 14 (ref 5–15)
BUN: 39 mg/dL — ABNORMAL HIGH (ref 6–20)
CO2: 28 mmol/L (ref 22–32)
Calcium: 9.8 mg/dL (ref 8.9–10.3)
Chloride: 101 mmol/L (ref 98–111)
Creatinine, Ser: 13.17 mg/dL — ABNORMAL HIGH (ref 0.61–1.24)
GFR, Estimated: 4 mL/min — ABNORMAL LOW (ref >60)
Glucose, Bld: 124 mg/dL — ABNORMAL HIGH (ref 70–99)
Potassium: 4.3 mmol/L (ref 3.5–5.1)
Sodium: 143 mmol/L (ref 135–145)
Total Bilirubin: 0.7 mg/dL (ref 0.3–1.2)
Total Protein: 7.5 g/dL (ref 6.5–8.1)

## 2021-02-15 LAB — LIPASE, BLOOD: Lipase: 62 U/L — ABNORMAL HIGH (ref 11–51)

## 2021-02-15 MED ORDER — ALUM & MAG HYDROXIDE-SIMETH 200-200-20 MG/5ML PO SUSP
30.0000 mL | Freq: Once | ORAL | Status: AC
  Administered 2021-02-15: 30 mL via ORAL
  Filled 2021-02-15: qty 30

## 2021-02-15 MED ORDER — HYDROMORPHONE HCL 1 MG/ML IJ SOLN
1.0000 mg | Freq: Once | INTRAMUSCULAR | Status: DC
Start: 2021-02-15 — End: 2021-02-15

## 2021-02-15 MED ORDER — LIDOCAINE VISCOUS HCL 2 % MT SOLN
15.0000 mL | Freq: Once | OROMUCOSAL | Status: AC
  Administered 2021-02-15: 15 mL via ORAL
  Filled 2021-02-15: qty 15

## 2021-02-15 MED ORDER — HYDROMORPHONE HCL 1 MG/ML IJ SOLN
1.0000 mg | Freq: Once | INTRAMUSCULAR | Status: AC
Start: 2021-02-15 — End: 2021-02-15
  Administered 2021-02-15: 1 mg via INTRAMUSCULAR
  Filled 2021-02-15: qty 1

## 2021-02-15 MED ORDER — ONDANSETRON HCL 4 MG/2ML IJ SOLN: 4.0000 mg | Freq: Once | INTRAMUSCULAR | Status: DC | PRN

## 2021-02-15 MED ORDER — PANTOPRAZOLE SODIUM 40 MG IV SOLR
40.0000 mg | Freq: Once | INTRAVENOUS | Status: AC
  Administered 2021-02-15: 40 mg via INTRAVENOUS
  Filled 2021-02-15: qty 40

## 2021-02-15 NOTE — ED Provider Notes (Signed)
[facility] EMERGENCY DEPARTMENT Provider Note   CSN: [id number] Arrival date & time: 02/15/21  0945     History Chief Complaint  Patient presents with  . Abdominal Pain    Edward HILLER Sr. is a 50 y.o. male.  50 year old male brought in by EMS from dialysis for abdominal pain with vomiting and diarrhea, did not have dialysis today. Reports diffuse abdominal pain onset this morning. Patient provides little history, is rolling around on the bed yelling and holding his abdomen. Level 5 caveat applies.           Past Medical History:  Diagnosis Date  . Acute on chronic renal failure (HCC) 11/25/2015  . Anemia   . Arthritis   . Blind    Left eye  . Cataract   . CHF (congestive heart failure) (HCC)   . Chronic kidney disease    stage 5  . COVID-19   . Depression    situatuional  . Diabetes mellitus    Type II  . GERD (gastroesophageal reflux disease)   . Graves disease 2014  . Headache    in past  . Hx of adenomatous and sessile serrated colonic polyps 11/21/2019  . Hyperlipidemia   . Hypertension   . Hypothyroidism   . Legally blind    right eye  . Neuropathy   . Non-compliance   . Proteinuria 05/24/2020  . Shortness of breath dyspnea    "Better since I've been taking my medications"    Patient Active Problem List   Diagnosis Date Noted  . ESRD (end stage renal disease) on dialysis (HCC)   . Hypertension associated with stage 5 chronic kidney disease due to type 2 diabetes mellitus (HCC) 05/24/2020  . Hyperphosphatemia 05/24/2020  . Proteinuria 05/24/2020  . Legally blind 05/24/2020  . Diabetic retinopathy of both eyes (HCC) 05/24/2020  . Pneumonia due to COVID-19 virus 05/23/2020  . Chronic diastolic heart failure (HCC) 10/24/2018  . 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 Mitral regurgitation, Moderate 09/05/2018  . ED (erectile dysfunction) 08/10/2017  . Chronic kidney disease, stage 5 (HCC) 07/18/2017  . Graves disease  03/02/2016  . Non compliance with medical treatment 11/26/2015  . Uncontrolled hypertension 11/26/2015  . Acute on chronic renal failure (HCC) 11/25/2015  . DM (diabetes mellitus), 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    Past Surgical History:  Procedure Laterality Date  . AV FISTULA PLACEMENT Right 04/22/2019   Procedure: RIGHT ARM ARTERIOVENOUS (AV) FISTULA CREATION;  Surgeon: Chuck Hint, MD;  Location: [facility] OR;  Service: Vascular;  Laterality: Right;  . CIRCUMCISION     as a child, around 48 years of age  . COLONOSCOPY    . EYE SURGERY Bilateral    "several "  . PARS PLANA VITRECTOMY Right 03/25/2016   Procedure: PARS PLANA VITRECTOMY 25 GAUGE FOR ENDOPHTHALMITIS;  Surgeon: Carmela Rima, MD;  Location: [facility] OR;  Service: Ophthalmology;  Laterality: Right;  . WISDOM TOOTH EXTRACTION         Family History  Problem Relation Age of Onset  . Hypertension Mother   . Diabetes Mother   . Bladder Cancer Brother   . Kidney disease Other   . Colon cancer Neg Hx   . Rectal cancer Neg Hx   . Esophageal cancer Neg Hx   . Stomach cancer Neg Hx     Social History   Tobacco Use  . Smoking status: Former Smoker    Packs/day: 0.75  Years: 3.00    Pack years: 2.25    Types: Cigarettes    Quit date: 05/28/2000    Years since quitting: 20.7  . Smokeless tobacco: Never Used  Vaping Use  . Vaping Use: Never used  Substance Use Topics  . Alcohol use: Not Currently    Alcohol/week: 0.0 standard drinks    Comment: 1 beer  ocassional  . Drug use: Yes    Types: Marijuana    Comment: daily for pain    Home Medications Prior to Admission medications   Medication Sig Start Date End Date Taking? Authorizing Provider  Accu-Chek Softclix Lancets lancets Use as instructed 08/21/20   Philemon Kingdom, MD  acetaminophen (TYLENOL) 500 MG tablet Take 500 mg by mouth every 6 (six) hours as needed.    [provider]  Alcohol Swabs (B-D SINGLE USE SWABS  REGULAR) PADS Use as directed. 01/09/20   [name], MD  amitriptyline (ELAVIL) 25 MG tablet Take 1 tablet (25 mg total) by mouth at bedtime for 7 days, THEN 2 tablets (50 mg total) at bedtime. 02/05/21 05/06/21  Gatha Mayer, MD  atorvastatin (LIPITOR) 40 MG tablet Take 1 tablet (40 mg total) by mouth daily. 10/22/19   [name], MD  Blood Glucose Monitoring Suppl (ACCU-CHEK GUIDE ME) w/Device KIT Use 3 times daily before meals 08/21/20   Philemon Kingdom, MD  diclofenac Sodium (VOLTAREN) 1 % GEL Apply 4 g topically 4 (four) times daily. 09/15/20   [name], MD  Emollient [facility] ADVANCED REPAIR) CREA Apply to dry skin as needed 07/17/20   Gildardo Pounds, NP  furosemide (LASIX) 80 MG tablet Take 80 mg by mouth 2 (two) times daily. 09/02/20   [provider]  gabapentin (NEURONTIN) 300 MG capsule Take 1 capsule (300 mg total) by mouth at bedtime. 09/15/20 12/14/20  [name], MD  glipiZIDE (GLUCOTROL XL) 2.5 MG 24 hr tablet Take by mouth daily.    [provider]  glucose blood (ACCU-CHEK GUIDE) test strip Use as instructed 08/21/20   Philemon Kingdom, MD  glycopyrrolate (ROBINUL) 2 MG tablet TAKE 1 TABLET(2 MG) BY MOUTH TWICE DAILY 12/15/20   Esterwood, Amy S, PA-C  ketoconazole (NIZORAL) 2 % cream Apply 1 application topically daily. 08/11/20   Criselda Peaches, DPM  levothyroxine (SYNTHROID) 137 MCG tablet TAKE 1 TABLET(137 MCG) BY MOUTH DAILY BEFORE BREAKFAST 08/21/20   Philemon Kingdom, MD  Methoxy PEG-Epoetin Beta (MIRCERA IJ) Mircera 12/30/20 12/29/21  [provider]  metolazone (ZAROXOLYN) 5 MG tablet  04/05/20   [provider]  omeprazole (PRILOSEC) 20 MG capsule Take 2 capsules (40 mg total) by mouth 2 (two) times daily before a meal. 11/14/20 12/14/20  Badalamente, Rudell Cobb, PA-C  oxycodone (OXY-IR) 5 MG capsule Take 1 capsule (5 mg total) by mouth every 4 (four) hours as needed for pain. 11/19/20   Gatha Mayer, MD  promethazine  (PHENERGAN) 25 MG tablet Take 1 tablet (25 mg total) by mouth every 6 (six) hours as needed for nausea or vomiting. 11/19/20   Gatha Mayer, MD  SENSIPAR 30 MG tablet Take 30 mg by mouth daily. 09/22/20   [provider]  sevelamer carbonate (RENVELA) 800 MG tablet Take 1 tablet (800 mg total) by mouth 3 (three) times daily with meals. 05/29/20   Daisy Floro, DO  sucralfate (CARAFATE) 1 g tablet Take 1 tablet (1 g total) by mouth 4 (four) times daily -  with meals and at bedtime. 11/14/20  12/14/20  Loni Beckwith, PA-C    Allergies    Patient has no known allergies.  Review of Systems   Review of Systems  Unable to perform ROS: Psychiatric disorder    Physical Exam Updated Vital Signs BP (!) 161/83 (BP Location: Left Arm)   Pulse 68   Temp (!) 97.5 F (36.4 C) (Oral)   Resp 19   SpO2 100%   Physical Exam Vitals and nursing note reviewed.  Constitutional:      General: Edward [name] is not in acute distress.    Appearance: Edward [name] is well-developed. Edward [name] is not diaphoretic.  HENT:     Head: Normocephalic and atraumatic.  Cardiovascular:     Rate and Rhythm: Normal rate and regular rhythm.  Pulmonary:     Effort: Pulmonary effort is normal.     Breath sounds: Normal breath sounds.  Abdominal:     Palpations: Abdomen is soft.     Tenderness: There is generalized abdominal tenderness.  Skin:    General: Skin is warm and dry.     Findings: No erythema or rash.  Neurological:     General: No focal deficit present.     Mental Status: Edward [name] is alert.  Psychiatric:        Mood and Affect: Affect is labile.     ED Results / Procedures / Treatments   Labs (all labs ordered are listed, but only abnormal results are displayed) Labs Reviewed  LIPASE, BLOOD - Abnormal; Notable for the following components:      Result Value   Lipase 62 (*)    All other components within normal limits  COMPREHENSIVE METABOLIC PANEL - Abnormal; Notable for the following components:   Glucose,  Bld 124 (*)    BUN 39 (*)    Creatinine, Ser 13.17 (*)    GFR, Estimated 4 (*)    All other components within normal limits  CBC - Abnormal; Notable for the following components:   RBC 3.04 (*)    Hemoglobin 10.8 (*)    HCT 32.1 (*)    MCV 105.6 (*)    MCH 35.5 (*)    nRBC 0.3 (*)    All other components within normal limits  URINALYSIS, ROUTINE W REFLEX MICROSCOPIC - Abnormal; Notable for the following components:   pH 9.0 (*)    Glucose, UA 50 (*)    Hgb urine dipstick SMALL (*)    Protein, ur 100 (*)    All other components within normal limits    EKG EKG Interpretation  Date/Time:  Monday Feb 15 2021 10:32:50 EDT Ventricular Rate:  84 PR Interval:  198 QRS Duration: 92 QT Interval:  447 QTC Calculation: 529 R Axis:   6 Text Interpretation: Sinus rhythm Low voltage, extremity leads Prolonged QT interval Confirmed by Sherwood Gambler [phone]) on 02/15/2021 12:57:23 PM   Radiology No results found.  Procedures Procedures   Medications Ordered in ED Medications  ondansetron (ZOFRAN) injection 4 mg (has no administration in time range)  pantoprazole (PROTONIX) injection 40 mg (has no administration in time range)  HYDROmorphone (DILAUDID) injection 1 mg (1 mg Intramuscular Given 02/15/21 1104)  alum & mag hydroxide-simeth (MAALOX/MYLANTA) 200-200-20 MG/5ML suspension 30 mL (30 mLs Oral Given 02/15/21 1104)    And  lidocaine (XYLOCAINE) 2 % viscous mouth solution 15 mL (15 mLs Oral Given 02/15/21 1104)    ED Course  I have reviewed the triage vital signs and the nursing notes.  Pertinent labs & imaging results that  were available during my care of the patient were reviewed by me and considered in my medical decision making (see chart for details).  Clinical Course as of 02/15/21 1339  Mon Feb 16, 7156  4134 50 year old male sent by dialysis for abdominal pain with vomiting and diarrhea. On arrival, stool is formed, no longer vomiting, generalized abdominal pain. Labs  obtained and are without significant changes from prior including CBC, CMP, lipase (62, similar to prior), UA with glucose and protein. Review of prior records including CT angio in 12/2020, similar presentations previously likely related to PUD.  Patient was given a GI cocktail and protonix, pain has improved. Plan is to reschedule dialysis, SW contacted, and likely dc. [LM]  8159 Patient has been scheduled for dialysis at 1130 tomorrow morning.  Patient opens his eyes as I discussed discharge planning with him but then closes his eyes and does not verbalize confirmation of understanding.  Advised patient this information would be printed on his discharge paperwork. [LM]    Clinical Course User Index [LM] Roque Lias   MDM Rules/Calculators/A&P                          Final Clinical Impression(s) / ED Diagnoses Final diagnoses:  Generalized abdominal pain    Rx / DC Orders ED Discharge Orders    None       Tacy Learn, PA-C 02/15/21 1339    Sherwood Gambler, MD 02/16/21 [phone]

## 2021-02-15 NOTE — Progress Notes (Signed)
Renal Navigator appreciates call from EDP/L. Percell Miller who requests that patient be rescheduled for HD at his outpatient clinic. He missed today as he was sent to ED for evaluation from HD for not feeling well. EDP states he is cleared for discharge today.  Patient has been rescheduled for treatment at his home clinic tomorrow, 02/16/21 at 11:50am. He needs to arrive at 11:30am. Navigator attempted to contact patient on his cell, but there was no answer. Navigator informed EDP and asked that the information be passed along to patient prior to discharge.   Alphonzo Cruise, [place] Renal Navigator [phone]

## 2021-02-15 NOTE — ED Triage Notes (Signed)
Per EMS: Pt went to dialysis, was complaining of abdominal pain and V/D, dialysis called EMS to bring for evaluation. No dialysis done today.

## 2021-02-15 NOTE — ED Notes (Signed)
Asked multiple times for pt to lay on his back so I could do his EKG. Pt refused and pretended to fall asleep. I informed the nurse of my multiple attempts.

## 2021-02-15 NOTE — Discharge Instructions (Addendum)
Arrive at your dialysis center at 11:30 for dialysis tomorrow.  Follow-up with your gastroenterologist.

## 2021-02-16 ENCOUNTER — Telehealth: Payer: Self-pay | Admitting: Physical Therapy

## 2021-02-16 ENCOUNTER — Ambulatory Visit: Payer: Medicare HMO | Attending: Family Medicine | Admitting: Physical Therapy

## 2021-02-16 DIAGNOSIS — Z992 Dependence on renal dialysis: Secondary | ICD-10-CM | POA: Diagnosis not present

## 2021-02-16 DIAGNOSIS — N186 End stage renal disease: Secondary | ICD-10-CM | POA: Diagnosis not present

## 2021-02-16 DIAGNOSIS — N2581 Secondary hyperparathyroidism of renal origin: Secondary | ICD-10-CM | POA: Diagnosis not present

## 2021-02-16 NOTE — Telephone Encounter (Signed)
Attempted to contact patient due to missed PT appointment. Left VM informing patient of missed appointment and due to attendance policy the patient will be discharged from PT. If patient would like to resume PT in the future he would need a new PT referral from his doctor to schedule a new evaluation.  Hilda Blades, PT, DPT, LAT, ATC 02/16/21  1:29 PM Phone: [phone] Fax: [phone]

## 2021-02-17 DIAGNOSIS — Z992 Dependence on renal dialysis: Secondary | ICD-10-CM | POA: Diagnosis not present

## 2021-02-17 DIAGNOSIS — N186 End stage renal disease: Secondary | ICD-10-CM | POA: Diagnosis not present

## 2021-02-17 DIAGNOSIS — N2581 Secondary hyperparathyroidism of renal origin: Secondary | ICD-10-CM | POA: Diagnosis not present

## 2021-02-19 DIAGNOSIS — N186 End stage renal disease: Secondary | ICD-10-CM | POA: Diagnosis not present

## 2021-02-19 DIAGNOSIS — Z992 Dependence on renal dialysis: Secondary | ICD-10-CM | POA: Diagnosis not present

## 2021-02-19 DIAGNOSIS — N2581 Secondary hyperparathyroidism of renal origin: Secondary | ICD-10-CM | POA: Diagnosis not present

## 2021-02-22 DIAGNOSIS — Z992 Dependence on renal dialysis: Secondary | ICD-10-CM | POA: Diagnosis not present

## 2021-02-22 DIAGNOSIS — N2581 Secondary hyperparathyroidism of renal origin: Secondary | ICD-10-CM | POA: Diagnosis not present

## 2021-02-22 DIAGNOSIS — N186 End stage renal disease: Secondary | ICD-10-CM | POA: Diagnosis not present

## 2021-02-24 ENCOUNTER — Encounter: Payer: Self-pay | Admitting: Internal Medicine

## 2021-02-24 DIAGNOSIS — N2581 Secondary hyperparathyroidism of renal origin: Secondary | ICD-10-CM | POA: Diagnosis not present

## 2021-02-24 DIAGNOSIS — N186 End stage renal disease: Secondary | ICD-10-CM | POA: Diagnosis not present

## 2021-02-24 DIAGNOSIS — Z992 Dependence on renal dialysis: Secondary | ICD-10-CM | POA: Diagnosis not present

## 2021-02-25 ENCOUNTER — Ambulatory Visit: Payer: Medicare HMO | Admitting: Internal Medicine

## 2021-02-25 ENCOUNTER — Encounter: Payer: Self-pay | Admitting: Internal Medicine

## 2021-02-25 NOTE — Progress Notes (Deleted)
Patient ID: [name], male   DOB: 30-Oct-1970, 50 y.o.   MRN: [id number]  This visit occurred during the SARS-CoV-2 public health emergency.  Safety protocols were in place, including screening questions prior to the visit, additional usage of staff PPE, and extensive cleaning of exam room while observing appropriate contact time as indicated for disinfecting solutions.   HPI  WILLEY DUE Sr. is a 50 y.o.-year-old male, initially referred by his PCP, Dr. Feliciana Rossetti, returning for f/u for Graves ds, now with post ablative hypothyroidism.  He also has type 2 diabetes (with DR, end-stage renal disease, ED, chronic diastolic heart failure), which is uncontrolled.  Last visit 6 months ago.  Interim history: He was admitted in 05/2020 due to COVID-19 and acute respiratory failure. He recovered well after that.  However, since last visit, he had numerous ED visits for AP and found to have an elevated lipase.  He continues to have significant abdominal pain. He is evaluated for transplant at [facility]  He was referred to pulmonary by the transplant team for restrictive lung disease.  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  Since last visit, he had a Thyroid Uptake and Scan (05/24/2017): 1. Elevated 24 hour radioactive iodine uptake equal to 36.9%. 2. Heterogeneous tracer activity within both lobes of the thyroid gland suggesting multinodular goiter.  He had RAI Tx (06/23/2017).  Afterwards, he was noncompliant with his levothyroxine and his office visits and TSH remained elevated.  In the past he was taking levothyroxine at night along with calcium.  I advised him to move  levothyroxine in the morning.  Pt is on levothyroxine 137 mcg daily, taken: -Missing doses - in am or right before HD - fasting - at least 30 min  from b'fast - no calcium - no iron - no multivitamins - no PPIs - not on Biotin  Reviewed his TFTs: Lab Results  Component Value Date   TSH 138.59 (H) 08/21/2020   TSH 35.088 (H) 05/23/2020   TSH 99.30 (H) 02/12/2020   TSH 72.05 (H) 12/20/2019   TSH 79.400 (H) 10/21/2019   TSH >49.10 (H) 10/22/2018   TSH 86.600 (H) 08/20/2018   TSH <0.01 (L) 08/10/2017   TSH <0.01 (L) 05/12/2017   TSH <0.01 (L) 02/21/2017   FREET4 0.14 (L) 08/21/2020   FREET4 0.19 (L) 02/12/2020   FREET4 0.44 (L) 12/20/2019   FREET4 0.41 (L) 10/21/2019   FREET4 0.26 (L) 10/22/2018   FREET4 2.92 (H) 08/10/2017   FREET4 1.69 (H) 05/12/2017   FREET4 1.48 02/21/2017   FREET4 1.50 09/20/2016   FREET4 1.24 05/31/2016    His TSI's were elevated: Lab Results  Component Value Date   TSI 463 (H) 02/26/2016   Pt denies: - feeling nodules in neck - hoarseness - dysphagia - choking - SOB with lying down  Pt does not have a FH of thyroid ds. No FH of thyroid cancer. No h/o radiation tx to head or neck.  No seaweed or kelp. No recent contrast studies. No herbal supplements. No Biotin use. No recent steroids use.   Latest HbA1c reviewed: Lab Results  Component Value Date   HGBA1C 7.0 (A) 08/21/2020   He is on: - Glipizide 2.5 mg before breakfast  He is not checking sugars.  He has end-stage renal disease - on HD: Lab  Results  Component Value Date   BUN 39 (H) 02/15/2021   BUN 17 01/06/2021   BUN 35 (H) 12/21/2020   BUN 58 (H) 11/14/2020   BUN 28 (H) 11/12/2020   BUN 29 (H) 09/27/2020   BUN 28 (H) 09/23/2020   BUN 27 (H) 09/17/2020   BUN 75 (H) 05/28/2020   BUN 102 (H) 05/27/2020   Lab Results  Component Value Date   CREATININE 13.17 (H) 02/15/2021   CREATININE 7.19 (H) 01/06/2021   CREATININE 13.35 (H) 12/21/2020   CREATININE 13.81 (H) 11/14/2020   CREATININE 9.71 (H) 11/12/2020   CREATININE 14.62 (H) 09/27/2020   CREATININE 13.77 (H) 09/23/2020   CREATININE 12.43 (H) 09/17/2020    CREATININE 5.30 (H) 05/28/2020   CREATININE 6.39 (H) 05/27/2020   Last eye exam was in 2021.  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  He also has a history of back pain, osteoarthritis.  ROS: Constitutional: no weight gain/no weight loss, no fatigue, no subjective hyperthermia, no subjective hypothermia Eyes: no blurry vision, no xerophthalmia ENT: no sore throat, + see HPI Cardiovascular: no CP/no SOB/no palpitations/no leg swelling Respiratory: no cough/no SOB/no wheezing Gastrointestinal: no N/no V/no D/no C/no acid reflux, + AP Musculoskeletal: no muscle aches/no joint aches Skin: no rashes, no hair loss Neurological: no tremors/no numbness/no tingling/no dizziness  I reviewed pt's medications, allergies, PMH, social hx, family hx, and changes were documented in the history of present illness. Otherwise, unchanged from my initial visit note.  Past Medical History:  Diagnosis Date  . Acute on chronic renal failure (HCC) 11/25/2015  . Anemia   . Arthritis   . Blind    Left eye  . Cataract   . CHF (congestive heart failure) (HCC)   . Chronic kidney disease    stage 5  . COVID-19   . Depression    situatuional  . Diabetes mellitus    Type II  . GERD (gastroesophageal reflux disease)   . Graves disease 2014  . Headache    in past  . Hx of adenomatous and sessile serrated colonic polyps 11/21/2019  . Hyperlipidemia   . Hypertension   . Hypothyroidism   . Legally blind    right eye  . Neuropathy   . Non-compliance   . Proteinuria 05/24/2020  . Shortness of breath dyspnea    "Better since I've been taking my medications"   Past Surgical History:  Procedure Laterality Date  . AV FISTULA PLACEMENT Right 04/22/2019   Procedure: RIGHT ARM ARTERIOVENOUS (AV) FISTULA CREATION;  Surgeon: Dickson, Christopher S, MD;  Location: [facility] OR;  Service: Vascular;  Laterality: Right;  . CIRCUMCISION     as a child, around 5 years of  age  . COLONOSCOPY    . EYE SURGERY Bilateral    "several "  . PARS PLANA VITRECTOMY Right 03/25/2016   Procedure: PARS PLANA VITRECTOMY 25 GAUGE FOR ENDOPHTHALMITIS;  Surgeon: Narendra Patel, MD;  Location: [facility] OR;  Service: Ophthalmology;  Laterality: Right;  . WISDOM TOOTH EXTRACTION     Social History   Social History  . Marital Status: Married    Spouse Name: N/A  . Number of Children: 5: 25-4 y/o (2017)   Occupational History  . Chef-cook   Social History Main Topics  . Smoking status: Former Smoker -- 0.75 packs/day for 3 years    Types: Cigarettes    Quit date: 2004  . Smokeless   tobacco: Not on file  . Alcohol Use:      Comment: 1 beer every night  . Drug Use: No   Current Outpatient Medications on File Prior to Visit  Medication Sig Dispense Refill  . IRON SUCROSE IV Iron Sucrose (Venofer)    . Methoxy PEG-Epoetin Beta (MIRCERA IJ) Mircera    . Accu-Chek Softclix Lancets lancets Use as instructed 100 each 12  . acetaminophen (TYLENOL) 500 MG tablet Take 500 mg by mouth every 6 (six) hours as needed.    . Alcohol Swabs (B-D SINGLE USE SWABS REGULAR) PADS Use as directed. 100 each 6  . amitriptyline (ELAVIL) 25 MG tablet Take 1 tablet (25 mg total) by mouth at bedtime for 7 days, THEN 2 tablets (50 mg total) at bedtime. 173 tablet 0  . atorvastatin (LIPITOR) 40 MG tablet Take 1 tablet (40 mg total) by mouth daily. 30 tablet 6  . Blood Glucose Monitoring Suppl (ACCU-CHEK GUIDE ME) w/Device KIT Use 3 times daily before meals 1 kit 0  . cinacalcet (SENSIPAR) 90 MG tablet Take 90 mg by mouth daily.    . diclofenac Sodium (VOLTAREN) 1 % GEL Apply 4 g topically 4 (four) times daily. 100 g 1  . Emollient (EUCERIN ADVANCED REPAIR) CREA Apply to dry skin as needed 454 g 0  . furosemide (LASIX) 80 MG tablet Take 80 mg by mouth 2 (two) times daily.    Marland Kitchen gabapentin (NEURONTIN) 300 MG capsule Take 1 capsule (300 mg total) by mouth at bedtime. 90 capsule 1  . glipiZIDE (GLUCOTROL XL)  2.5 MG 24 hr tablet Take by mouth daily.    Marland Kitchen glucose blood (ACCU-CHEK GUIDE) test strip Use as instructed 100 each 12  . glycopyrrolate (ROBINUL) 2 MG tablet TAKE 1 TABLET(2 MG) BY MOUTH TWICE DAILY 180 tablet 1  . ketoconazole (NIZORAL) 2 % cream Apply 1 application topically daily. 60 g 2  . levothyroxine (SYNTHROID) 137 MCG tablet TAKE 1 TABLET(137 MCG) BY MOUTH DAILY BEFORE BREAKFAST 45 tablet 5  . metolazone (ZAROXOLYN) 5 MG tablet     . omeprazole (PRILOSEC) 20 MG capsule Take 2 capsules (40 mg total) by mouth 2 (two) times daily before a meal. 120 capsule 0  . oxycodone (OXY-IR) 5 MG capsule Take 1 capsule (5 mg total) by mouth every 4 (four) hours as needed for pain. 20 capsule 0  . promethazine (PHENERGAN) 25 MG tablet Take 1 tablet (25 mg total) by mouth every 6 (six) hours as needed for nausea or vomiting. 30 tablet 0  . SENSIPAR 30 MG tablet Take 30 mg by mouth daily.    . sevelamer carbonate (RENVELA) 800 MG tablet Take 1 tablet (800 mg total) by mouth 3 (three) times daily with meals. 90 tablet 0  . sucralfate (CARAFATE) 1 g tablet Take 1 tablet (1 g total) by mouth 4 (four) times daily -  with meals and at bedtime. 120 tablet 0   No current facility-administered medications on file prior to visit.   No Known Allergies Family History  Problem Relation Age of Onset  . Hypertension Mother   . Diabetes Mother   . Bladder Cancer Brother   . Kidney disease Other   . Colon cancer Neg Hx   . Rectal cancer Neg Hx   . Esophageal cancer Neg Hx   . Stomach cancer Neg Hx    PE: There were no vitals taken for this visit.  Wt Readings from Last 3 Encounters:  01/19/21 200 lb (  90.7 kg)  12/29/20 200 lb (90.7 kg)  11/19/20 201 lb 6.4 oz (91.4 kg)   Constitutional: overweight, in NAD Eyes: PERRLA, EOMI, + exophthalmos ENT: moist mucous membranes, no thyromegaly, no cervical lymphadenopathy Cardiovascular: RRR, No MRG, no LE edema Respiratory: CTA B Gastrointestinal: abdomen soft,  NT, ND, BS+ Musculoskeletal: no deformities, strength intact in all 4 Skin: moist, warm, no rashes Neurological: no tremor with outstretched hands, DTR normal in all 4  ASSESSMENT: 1. Postablative hypothyroidism  2. H/o Graves ds - thyrotoxicosis since at least 2014  3.  Type 2 diabetes, uncontrolled, with complications - dCHF - ESRD - DR - ED  PLAN:  1.  Patient with post ablative hypothyroidism developed after RAI treatment for Graves' disease.  She was noncompliant with his levothyroxine medication in the past and he has very high TSH levels.  At last visit, he mentioned he was missing doses. - latest thyroid labs reviewed with pt >> still very high, although improving Lab Results  Component Value Date   TSH 138.59 (H) 08/21/2020   - he continues on LT4 137 mcg daily -He tells me that he misses almost 50% of his levothyroxine doses.  We discussed about health consequences of uncontrolled hypothyroidism.  I again advised him that it is absolutely mandatory not to miss any doses.  He tells me that he is occasionally taking the doses right before dialysis I advised him to move the levothyroxine bottle on his nightstand and take it as soon as he wakes up in the morning, and not before dialysis.  I also advised him that if he does not start taking medications daily as advised repeatedly, I cannot help him and he should not return for another appointment. - we discussed about taking the thyroid hormone every day, with water, >30 minutes before breakfast, separated by >4 hours from acid reflux medications, calcium, iron, multivitamins.  - will check thyroid tests today: TSH and fT4 - If labs are abnormal, he will need to return for repeat TFTs in 1.5 months  2.  History of Graves' disease -This was diagnosed in 2014 -He had positive TSI antibodies -He is status post RAI treatment 06/2017 -He has no active Graves' ophthalmopathy, but he does have exophthalmos -He has slight left  thyromegaly but no neck compression symptoms  3.  Type 2 diabetes -He is taking the low-dose glipizide, 2.5 mg daily -At last visit, HbA1c was 5.8%, much improved.  He had another HbA1c since then, in 05/2020, at 6.7%, worse -At today's visit, HbA1c is 7.0% (higher) -I advised him to restart to check sugars every day or every other day - I sent prescriptions for a meter, lancets, strips to his pharmacy -I cannot change his regimen for now without more data but will see him back in 3 months and may need to adjust the regimen then.  Needs refills LT4.   Philemon Kingdom, MD PhD [facility]

## 2021-02-26 DIAGNOSIS — J9621 Acute and chronic respiratory failure with hypoxia: Secondary | ICD-10-CM | POA: Diagnosis not present

## 2021-02-26 DIAGNOSIS — Z992 Dependence on renal dialysis: Secondary | ICD-10-CM | POA: Diagnosis not present

## 2021-02-26 DIAGNOSIS — N2581 Secondary hyperparathyroidism of renal origin: Secondary | ICD-10-CM | POA: Diagnosis not present

## 2021-02-26 DIAGNOSIS — U071 COVID-19: Secondary | ICD-10-CM | POA: Diagnosis not present

## 2021-02-26 DIAGNOSIS — N186 End stage renal disease: Secondary | ICD-10-CM | POA: Diagnosis not present

## 2021-03-01 DIAGNOSIS — Z992 Dependence on renal dialysis: Secondary | ICD-10-CM | POA: Diagnosis not present

## 2021-03-01 DIAGNOSIS — N186 End stage renal disease: Secondary | ICD-10-CM | POA: Diagnosis not present

## 2021-03-01 DIAGNOSIS — N2581 Secondary hyperparathyroidism of renal origin: Secondary | ICD-10-CM | POA: Diagnosis not present

## 2021-03-02 DIAGNOSIS — E1129 Type 2 diabetes mellitus with other diabetic kidney complication: Secondary | ICD-10-CM | POA: Diagnosis not present

## 2021-03-02 DIAGNOSIS — N186 End stage renal disease: Secondary | ICD-10-CM | POA: Diagnosis not present

## 2021-03-02 DIAGNOSIS — Z992 Dependence on renal dialysis: Secondary | ICD-10-CM | POA: Diagnosis not present

## 2021-03-03 DIAGNOSIS — N2581 Secondary hyperparathyroidism of renal origin: Secondary | ICD-10-CM | POA: Diagnosis not present

## 2021-03-03 DIAGNOSIS — N186 End stage renal disease: Secondary | ICD-10-CM | POA: Diagnosis not present

## 2021-03-03 DIAGNOSIS — Z992 Dependence on renal dialysis: Secondary | ICD-10-CM | POA: Diagnosis not present

## 2021-03-05 ENCOUNTER — Telehealth: Payer: Self-pay | Admitting: Internal Medicine

## 2021-03-05 DIAGNOSIS — Z992 Dependence on renal dialysis: Secondary | ICD-10-CM | POA: Diagnosis not present

## 2021-03-05 DIAGNOSIS — N2581 Secondary hyperparathyroidism of renal origin: Secondary | ICD-10-CM | POA: Diagnosis not present

## 2021-03-05 DIAGNOSIS — N186 End stage renal disease: Secondary | ICD-10-CM | POA: Diagnosis not present

## 2021-03-05 NOTE — Telephone Encounter (Signed)
Patient dismissed from [facility] by Philemon Kingdom, MD, effective 02/25/21. Dismissal Letter sent out by 1st class mail. [employer]

## 2021-03-08 DIAGNOSIS — Z992 Dependence on renal dialysis: Secondary | ICD-10-CM | POA: Diagnosis not present

## 2021-03-08 DIAGNOSIS — N186 End stage renal disease: Secondary | ICD-10-CM | POA: Diagnosis not present

## 2021-03-08 DIAGNOSIS — N2581 Secondary hyperparathyroidism of renal origin: Secondary | ICD-10-CM | POA: Diagnosis not present

## 2021-03-10 DIAGNOSIS — N2581 Secondary hyperparathyroidism of renal origin: Secondary | ICD-10-CM | POA: Diagnosis not present

## 2021-03-10 DIAGNOSIS — Z992 Dependence on renal dialysis: Secondary | ICD-10-CM | POA: Diagnosis not present

## 2021-03-10 DIAGNOSIS — N186 End stage renal disease: Secondary | ICD-10-CM | POA: Diagnosis not present

## 2021-03-12 DIAGNOSIS — Z992 Dependence on renal dialysis: Secondary | ICD-10-CM | POA: Diagnosis not present

## 2021-03-12 DIAGNOSIS — N2581 Secondary hyperparathyroidism of renal origin: Secondary | ICD-10-CM | POA: Diagnosis not present

## 2021-03-12 DIAGNOSIS — N186 End stage renal disease: Secondary | ICD-10-CM | POA: Diagnosis not present

## 2021-03-15 DIAGNOSIS — Z992 Dependence on renal dialysis: Secondary | ICD-10-CM | POA: Diagnosis not present

## 2021-03-15 DIAGNOSIS — N186 End stage renal disease: Secondary | ICD-10-CM | POA: Diagnosis not present

## 2021-03-15 DIAGNOSIS — N2581 Secondary hyperparathyroidism of renal origin: Secondary | ICD-10-CM | POA: Diagnosis not present

## 2021-03-17 DIAGNOSIS — Z992 Dependence on renal dialysis: Secondary | ICD-10-CM | POA: Diagnosis not present

## 2021-03-17 DIAGNOSIS — N186 End stage renal disease: Secondary | ICD-10-CM | POA: Diagnosis not present

## 2021-03-17 DIAGNOSIS — N2581 Secondary hyperparathyroidism of renal origin: Secondary | ICD-10-CM | POA: Diagnosis not present

## 2021-03-19 DIAGNOSIS — Z992 Dependence on renal dialysis: Secondary | ICD-10-CM | POA: Diagnosis not present

## 2021-03-19 DIAGNOSIS — N186 End stage renal disease: Secondary | ICD-10-CM | POA: Diagnosis not present

## 2021-03-19 DIAGNOSIS — N2581 Secondary hyperparathyroidism of renal origin: Secondary | ICD-10-CM | POA: Diagnosis not present

## 2021-03-22 DIAGNOSIS — N186 End stage renal disease: Secondary | ICD-10-CM | POA: Diagnosis not present

## 2021-03-22 DIAGNOSIS — N2581 Secondary hyperparathyroidism of renal origin: Secondary | ICD-10-CM | POA: Diagnosis not present

## 2021-03-22 DIAGNOSIS — Z992 Dependence on renal dialysis: Secondary | ICD-10-CM | POA: Diagnosis not present

## 2021-03-24 DIAGNOSIS — Z992 Dependence on renal dialysis: Secondary | ICD-10-CM | POA: Diagnosis not present

## 2021-03-24 DIAGNOSIS — N186 End stage renal disease: Secondary | ICD-10-CM | POA: Diagnosis not present

## 2021-03-24 DIAGNOSIS — N2581 Secondary hyperparathyroidism of renal origin: Secondary | ICD-10-CM | POA: Diagnosis not present

## 2021-03-26 DIAGNOSIS — N186 End stage renal disease: Secondary | ICD-10-CM | POA: Diagnosis not present

## 2021-03-26 DIAGNOSIS — N2581 Secondary hyperparathyroidism of renal origin: Secondary | ICD-10-CM | POA: Diagnosis not present

## 2021-03-26 DIAGNOSIS — Z992 Dependence on renal dialysis: Secondary | ICD-10-CM | POA: Diagnosis not present

## 2021-03-29 DIAGNOSIS — N2581 Secondary hyperparathyroidism of renal origin: Secondary | ICD-10-CM | POA: Diagnosis not present

## 2021-03-29 DIAGNOSIS — N186 End stage renal disease: Secondary | ICD-10-CM | POA: Diagnosis not present

## 2021-03-29 DIAGNOSIS — U071 COVID-19: Secondary | ICD-10-CM | POA: Diagnosis not present

## 2021-03-29 DIAGNOSIS — J9621 Acute and chronic respiratory failure with hypoxia: Secondary | ICD-10-CM | POA: Diagnosis not present

## 2021-03-29 DIAGNOSIS — Z992 Dependence on renal dialysis: Secondary | ICD-10-CM | POA: Diagnosis not present

## 2021-03-31 ENCOUNTER — Ambulatory Visit: Payer: Medicare HMO | Admitting: Physician Assistant

## 2021-03-31 DIAGNOSIS — Z992 Dependence on renal dialysis: Secondary | ICD-10-CM | POA: Diagnosis not present

## 2021-03-31 DIAGNOSIS — N186 End stage renal disease: Secondary | ICD-10-CM | POA: Diagnosis not present

## 2021-03-31 DIAGNOSIS — Z23 Encounter for immunization: Secondary | ICD-10-CM | POA: Diagnosis not present

## 2021-03-31 DIAGNOSIS — N2581 Secondary hyperparathyroidism of renal origin: Secondary | ICD-10-CM | POA: Diagnosis not present

## 2021-04-01 DIAGNOSIS — N186 End stage renal disease: Secondary | ICD-10-CM | POA: Diagnosis not present

## 2021-04-01 DIAGNOSIS — Z992 Dependence on renal dialysis: Secondary | ICD-10-CM | POA: Diagnosis not present

## 2021-04-01 DIAGNOSIS — E1129 Type 2 diabetes mellitus with other diabetic kidney complication: Secondary | ICD-10-CM | POA: Diagnosis not present

## 2021-04-02 DIAGNOSIS — Z992 Dependence on renal dialysis: Secondary | ICD-10-CM | POA: Diagnosis not present

## 2021-04-02 DIAGNOSIS — N186 End stage renal disease: Secondary | ICD-10-CM | POA: Diagnosis not present

## 2021-04-02 DIAGNOSIS — N2581 Secondary hyperparathyroidism of renal origin: Secondary | ICD-10-CM | POA: Diagnosis not present

## 2021-04-05 DIAGNOSIS — N2581 Secondary hyperparathyroidism of renal origin: Secondary | ICD-10-CM | POA: Diagnosis not present

## 2021-04-05 DIAGNOSIS — Z992 Dependence on renal dialysis: Secondary | ICD-10-CM | POA: Diagnosis not present

## 2021-04-05 DIAGNOSIS — N186 End stage renal disease: Secondary | ICD-10-CM | POA: Diagnosis not present

## 2021-04-07 DIAGNOSIS — N2581 Secondary hyperparathyroidism of renal origin: Secondary | ICD-10-CM | POA: Diagnosis not present

## 2021-04-07 DIAGNOSIS — Z992 Dependence on renal dialysis: Secondary | ICD-10-CM | POA: Diagnosis not present

## 2021-04-07 DIAGNOSIS — N186 End stage renal disease: Secondary | ICD-10-CM | POA: Diagnosis not present

## 2021-04-09 DIAGNOSIS — N2581 Secondary hyperparathyroidism of renal origin: Secondary | ICD-10-CM | POA: Diagnosis not present

## 2021-04-09 DIAGNOSIS — N186 End stage renal disease: Secondary | ICD-10-CM | POA: Diagnosis not present

## 2021-04-09 DIAGNOSIS — Z992 Dependence on renal dialysis: Secondary | ICD-10-CM | POA: Diagnosis not present

## 2021-04-12 DIAGNOSIS — Z992 Dependence on renal dialysis: Secondary | ICD-10-CM | POA: Diagnosis not present

## 2021-04-12 DIAGNOSIS — N186 End stage renal disease: Secondary | ICD-10-CM | POA: Diagnosis not present

## 2021-04-12 DIAGNOSIS — N2581 Secondary hyperparathyroidism of renal origin: Secondary | ICD-10-CM | POA: Diagnosis not present

## 2021-04-14 DIAGNOSIS — Z992 Dependence on renal dialysis: Secondary | ICD-10-CM | POA: Diagnosis not present

## 2021-04-14 DIAGNOSIS — N186 End stage renal disease: Secondary | ICD-10-CM | POA: Diagnosis not present

## 2021-04-14 DIAGNOSIS — N2581 Secondary hyperparathyroidism of renal origin: Secondary | ICD-10-CM | POA: Diagnosis not present

## 2021-04-15 DIAGNOSIS — R0602 Shortness of breath: Secondary | ICD-10-CM | POA: Diagnosis not present

## 2021-04-15 DIAGNOSIS — F17200 Nicotine dependence, unspecified, uncomplicated: Secondary | ICD-10-CM | POA: Diagnosis not present

## 2021-04-15 DIAGNOSIS — J984 Other disorders of lung: Secondary | ICD-10-CM | POA: Diagnosis not present

## 2021-04-15 DIAGNOSIS — I313 Pericardial effusion (noninflammatory): Secondary | ICD-10-CM | POA: Diagnosis not present

## 2021-04-16 DIAGNOSIS — Z992 Dependence on renal dialysis: Secondary | ICD-10-CM | POA: Diagnosis not present

## 2021-04-16 DIAGNOSIS — N186 End stage renal disease: Secondary | ICD-10-CM | POA: Diagnosis not present

## 2021-04-16 DIAGNOSIS — N2581 Secondary hyperparathyroidism of renal origin: Secondary | ICD-10-CM | POA: Diagnosis not present

## 2021-04-21 ENCOUNTER — Encounter (HOSPITAL_COMMUNITY): Payer: Self-pay | Admitting: Emergency Medicine

## 2021-04-21 ENCOUNTER — Other Ambulatory Visit: Payer: Self-pay

## 2021-04-21 ENCOUNTER — Emergency Department (HOSPITAL_COMMUNITY)
Admission: EM | Admit: 2021-04-21 | Discharge: 2021-04-21 | Disposition: A | Discharge status: Home / Self Care | Payer: Medicare HMO | Attending: Emergency Medicine | Admitting: Emergency Medicine

## 2021-04-21 DIAGNOSIS — I132 Hypertensive heart and chronic kidney disease with heart failure and with stage 5 chronic kidney disease, or end stage renal disease: Secondary | ICD-10-CM | POA: Insufficient documentation

## 2021-04-21 DIAGNOSIS — E039 Hypothyroidism, unspecified: Secondary | ICD-10-CM | POA: Insufficient documentation

## 2021-04-21 DIAGNOSIS — Z8616 Personal history of COVID-19: Secondary | ICD-10-CM | POA: Insufficient documentation

## 2021-04-21 DIAGNOSIS — Z87891 Personal history of nicotine dependence: Secondary | ICD-10-CM | POA: Diagnosis not present

## 2021-04-21 DIAGNOSIS — N186 End stage renal disease: Secondary | ICD-10-CM | POA: Diagnosis not present

## 2021-04-21 DIAGNOSIS — N2581 Secondary hyperparathyroidism of renal origin: Secondary | ICD-10-CM | POA: Diagnosis not present

## 2021-04-21 DIAGNOSIS — Z7984 Long term (current) use of oral hypoglycemic drugs: Secondary | ICD-10-CM | POA: Diagnosis not present

## 2021-04-21 DIAGNOSIS — E1122 Type 2 diabetes mellitus with diabetic chronic kidney disease: Secondary | ICD-10-CM | POA: Insufficient documentation

## 2021-04-21 DIAGNOSIS — R109 Unspecified abdominal pain: Secondary | ICD-10-CM | POA: Diagnosis not present

## 2021-04-21 DIAGNOSIS — R1084 Generalized abdominal pain: Secondary | ICD-10-CM

## 2021-04-21 DIAGNOSIS — Z992 Dependence on renal dialysis: Secondary | ICD-10-CM | POA: Diagnosis not present

## 2021-04-21 DIAGNOSIS — R112 Nausea with vomiting, unspecified: Secondary | ICD-10-CM | POA: Insufficient documentation

## 2021-04-21 DIAGNOSIS — I5032 Chronic diastolic (congestive) heart failure: Secondary | ICD-10-CM | POA: Insufficient documentation

## 2021-04-21 DIAGNOSIS — Z79899 Other long term (current) drug therapy: Secondary | ICD-10-CM | POA: Insufficient documentation

## 2021-04-21 DIAGNOSIS — E11319 Type 2 diabetes mellitus with unspecified diabetic retinopathy without macular edema: Secondary | ICD-10-CM | POA: Insufficient documentation

## 2021-04-21 LAB — COMPREHENSIVE METABOLIC PANEL
ALT: 85 U/L — ABNORMAL HIGH (ref 0–44)
AST: 61 U/L — ABNORMAL HIGH (ref 15–41)
Albumin: 4.1 g/dL (ref 3.5–5.0)
Alkaline Phosphatase: 66 U/L (ref 38–126)
Anion gap: 17 — ABNORMAL HIGH (ref 5–15)
BUN: 46 mg/dL — ABNORMAL HIGH (ref 6–20)
CO2: 28 mmol/L (ref 22–32)
Calcium: 9.5 mg/dL (ref 8.9–10.3)
Chloride: 99 mmol/L (ref 98–111)
Creatinine, Ser: 15.44 mg/dL — ABNORMAL HIGH (ref 0.61–1.24)
GFR, Estimated: 3 mL/min — ABNORMAL LOW (ref >60)
Glucose, Bld: 94 mg/dL (ref 70–99)
Potassium: 4.2 mmol/L (ref 3.5–5.1)
Sodium: 144 mmol/L (ref 135–145)
Total Bilirubin: 0.8 mg/dL (ref 0.3–1.2)
Total Protein: 7.2 g/dL (ref 6.5–8.1)

## 2021-04-21 LAB — CBC
HCT: 30 % — ABNORMAL LOW (ref 39.0–52.0)
Hemoglobin: 10.3 g/dL — ABNORMAL LOW (ref 13.0–17.0)
MCH: 36 pg — ABNORMAL HIGH (ref 26.0–34.0)
MCHC: 34.3 g/dL (ref 30.0–36.0)
MCV: 104.9 fL — ABNORMAL HIGH (ref 80.0–100.0)
Platelets: 166 10*3/uL (ref 150–400)
RBC: 2.86 MIL/uL — ABNORMAL LOW (ref 4.22–5.81)
RDW: 14.2 % (ref 11.5–15.5)
WBC: 5.6 10*3/uL (ref 4.0–10.5)
nRBC: 0 % (ref 0.0–0.2)

## 2021-04-21 LAB — ETHANOL: Alcohol, Ethyl (B): <10 mg/dL (ref <10)

## 2021-04-21 LAB — LIPASE, BLOOD: Lipase: 65 U/L — ABNORMAL HIGH (ref 11–51)

## 2021-04-21 MED ORDER — LORAZEPAM 2 MG/ML IJ SOLN
0.5000 mg | Freq: Once | INTRAMUSCULAR | Status: AC
  Administered 2021-04-21: 0.5 mg via INTRAVENOUS
  Filled 2021-04-21: qty 1

## 2021-04-21 MED ORDER — FENTANYL CITRATE (PF) 100 MCG/2ML IJ SOLN
50.0000 ug | Freq: Once | INTRAMUSCULAR | Status: AC
Start: 2021-04-21 — End: 2021-04-21
  Administered 2021-04-21: 50 ug via INTRAVENOUS
  Filled 2021-04-21: qty 2

## 2021-04-21 MED ORDER — METOCLOPRAMIDE HCL 5 MG/ML IJ SOLN
10.0000 mg | Freq: Once | INTRAMUSCULAR | Status: AC
  Administered 2021-04-21: 10 mg via INTRAVENOUS
  Filled 2021-04-21: qty 2

## 2021-04-21 NOTE — ED Notes (Signed)
Patient screaming in room demanding someone come see him and give him pain medication.

## 2021-04-21 NOTE — ED Notes (Signed)
Patient rolling around in bed holding lower stomach moaning reporting his stomach hurts.  Reports just had a BM in lobby.

## 2021-04-21 NOTE — ED Notes (Signed)
Asked patient if he still makes urine because we need a specimen.  Reports he does but he urinated in lobby waiting.  Unable to provide specimen at this time.

## 2021-04-21 NOTE — ED Triage Notes (Signed)
Patient here with lower abdominal pain that has been going on for yesterday.  Nausea and vomiting, weakness.  Last time he ate was Tuesday, CBG of 115.  162/98, 75, 22.

## 2021-04-21 NOTE — Discharge Instructions (Addendum)
You came to the emergency department today to be evaluated for your abdominal pain.  Your physical exam and lab work were reassuring.  Patient symptoms are likely due to your chronic abdominal pain or cannabinoid hyperemesis syndrome.  Please refrain from smoking marijuana to see if she has any improvement in your symptoms.  Please follow-up with your gastroenterologist.    Please go to your dialysis center after discharge from the hospital for regular scheduled treatment.  Your center is expecting you.  Get help right away if: Your pain does not go away as soon as your health care provider told you to expect. You cannot stop vomiting. Your pain is only in areas of the abdomen, such as the right side or the left lower portion of the abdomen. Pain on the right side could be caused by appendicitis. You have bloody or black stools, or stools that look like tar. You have severe pain, cramping, or bloating in your abdomen. You have signs of dehydration, such as: Dark urine, very little urine, or no urine. Cracked lips. Dry mouth. Sunken eyes. Sleepiness. Weakness. You have trouble breathing or chest pain.

## 2021-04-21 NOTE — ED Notes (Signed)
2 unsuccessful IV starts, Multimedia programmer to do [place] IV

## 2021-04-21 NOTE — ED Provider Notes (Signed)
MSE was initiated and I personally evaluated the patient and placed orders (if any) at  5:01 AM on April 21, 2021.  Patient to ED with c/o abdominal pain, nausea without vomiting, denies fever and diarrhea. No abdominal surgeries in the past. Pain started several days ago. He has not taken anything at home for symptoms.   Today's Vitals   04/21/21 0450  BP: (!) 155/99  Pulse: 80  Resp: 17  Temp: 97.7 F (36.5 C)  TempSrc: Oral  SpO2: 100%   There is no height or weight on file to calculate BMI.  Patient is very sleepy Wakes with loud verbal stimuli, then trails off Abdomen is soft, nondistended.   The patient appears stable so that the remainder of the MSE may be completed by another provider.   Charlann Lange, PA-C 04/21/21 0503    Maudie Flakes, MD 04/21/21 [phone]

## 2021-04-21 NOTE — ED Provider Notes (Signed)
[facility] EMERGENCY DEPARTMENT Provider Note   CSN: [id number] Arrival date & time: 04/21/21  0446     History Chief Complaint  Patient presents with   Abdominal Pain    Edward TONER Sr. is a 50 y.o. male.   Abdominal Pain Associated symptoms: nausea and vomiting   Associated symptoms: no fever and no shortness of breath   Patient presents with abdominal pain.  States that it has been going for several days now.  History of same.  Reviewing records it appears as if it is thought to be due to "diabetic nerve damage".  This is his seventh visit for the same in the last 6 months.  States he is from vomiting.  States he feels bad.  States that he did not go to dialysis today or Monday and his last dialysis was Friday.  Patient goes from sleeping to yelling that he needs pain medicine.    Past Medical History:  Diagnosis Date   Acute on chronic renal failure ([place]) 11/25/2015   Anemia    Arthritis    Blind    Left eye   Cataract    CHF (congestive heart failure) (HCC)    Chronic kidney disease    stage 5   COVID-19    Depression    situatuional   Diabetes mellitus    Type II   GERD (gastroesophageal reflux disease)    Graves disease 2014   Headache    in past   Hx of adenomatous and sessile serrated colonic polyps 11/21/2019   Hyperlipidemia    Hypertension    Hypothyroidism    Legally blind    right eye   Neuropathy    Non-compliance    Proteinuria 05/24/2020   Shortness of breath dyspnea    "Better since I've been taking my medications"    Patient Active Problem List   Diagnosis Date Noted   ESRD (end stage renal disease) on dialysis ([place])    Hypertension associated with stage 5 chronic kidney disease due to type 2 diabetes mellitus ([place]) 05/24/2020   Hyperphosphatemia 05/24/2020   Proteinuria 05/24/2020   Legally blind 05/24/2020   Diabetic retinopathy of both eyes ([place]) 05/24/2020   Pneumonia due to COVID-19 virus 05/23/2020    Chronic diastolic heart failure ([place]) 10/24/2018   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   Mitral regurgitation, Moderate 09/05/2018   ED (erectile dysfunction) 08/10/2017   Chronic kidney disease, stage 5 ([place]) 07/18/2017   Graves disease 03/02/2016   Non compliance with medical treatment 11/26/2015   Uncontrolled hypertension 11/26/2015   Acute on chronic renal failure ([place]) 11/25/2015   DM (diabetes mellitus), type 2 with ophthalmic complications ([place]) 49/20/1007    Past Surgical History:  Procedure Laterality Date   AV FISTULA PLACEMENT Right 04/22/2019   Procedure: RIGHT ARM ARTERIOVENOUS (AV) FISTULA CREATION;  Surgeon: Angelia Mould, MD;  Location: [facility];  Service: Vascular;  Laterality: Right;   CIRCUMCISION     as a child, around 59 years of age   COLONOSCOPY     EYE SURGERY Bilateral    "several "   PARS PLANA VITRECTOMY Right 03/25/2016   Procedure: PARS PLANA VITRECTOMY 25 GAUGE FOR ENDOPHTHALMITIS;  Surgeon: Jalene Mullet, MD;  Location: [place];  Service: Ophthalmology;  Laterality: Right;   WISDOM TOOTH EXTRACTION         Family History  Problem Relation Age of Onset   Hypertension Mother    Diabetes Mother    Bladder Cancer  Brother    Kidney disease Other    Colon cancer Neg Hx    Rectal cancer Neg Hx    Esophageal cancer Neg Hx    Stomach cancer Neg Hx     Social History   Tobacco Use   Smoking status: Former    Packs/day: 0.75    Years: 3.00    Pack years: 2.25    Types: Cigarettes    Quit date: 05/28/2000    Years since quitting: 20.9   Smokeless tobacco: Never  Vaping Use   Vaping Use: Never used  Substance Use Topics   Alcohol use: Not Currently    Alcohol/week: 0.0 standard drinks    Comment: 1 beer  ocassional   Drug use: Yes    Types: Marijuana    Comment: daily for pain    Home Medications Prior to Admission medications   Medication Sig Start Date End Date Taking? Authorizing Provider  Accu-Chek Softclix Lancets lancets  Use as instructed 08/21/20   Philemon Kingdom, MD  acetaminophen (TYLENOL) 500 MG tablet Take 500 mg by mouth every 6 (six) hours as needed.    [provider]  Alcohol Swabs (B-D SINGLE USE SWABS REGULAR) PADS Use as directed. 01/09/20   [name], MD  amitriptyline (ELAVIL) 25 MG tablet Take 1 tablet (25 mg total) by mouth at bedtime for 7 days, THEN 2 tablets (50 mg total) at bedtime. 02/05/21 05/06/21  Gatha Mayer, MD  atorvastatin (LIPITOR) 40 MG tablet Take 1 tablet (40 mg total) by mouth daily. 10/22/19   [name], MD  Blood Glucose Monitoring Suppl (ACCU-CHEK GUIDE ME) w/Device KIT Use 3 times daily before meals 08/21/20   Philemon Kingdom, MD  cinacalcet (SENSIPAR) 90 MG tablet Take 90 mg by mouth daily. 02/09/21   [provider]  diclofenac Sodium (VOLTAREN) 1 % GEL Apply 4 g topically 4 (four) times daily. 09/15/20   [name], MD  Emollient [facility] ADVANCED REPAIR) CREA Apply to dry skin as needed 07/17/20   Gildardo Pounds, NP  furosemide (LASIX) 80 MG tablet Take 80 mg by mouth 2 (two) times daily. 09/02/20   [provider]  gabapentin (NEURONTIN) 300 MG capsule Take 1 capsule (300 mg total) by mouth at bedtime. 09/15/20 12/14/20  [name], MD  glipiZIDE (GLUCOTROL XL) 2.5 MG 24 hr tablet Take by mouth daily.    [provider]  glucose blood (ACCU-CHEK GUIDE) test strip Use as instructed 08/21/20   Philemon Kingdom, MD  glycopyrrolate (ROBINUL) 2 MG tablet TAKE 1 TABLET(2 MG) BY MOUTH TWICE DAILY 12/15/20   Esterwood, Amy S, PA-C  IRON SUCROSE IV Iron Sucrose (Venofer) 02/24/21 03/17/21  [provider]  ketoconazole (NIZORAL) 2 % cream Apply 1 application topically daily. 08/11/20   Criselda Peaches, DPM  levothyroxine (SYNTHROID) 137 MCG tablet TAKE 1 TABLET(137 MCG) BY MOUTH DAILY BEFORE BREAKFAST 08/21/20   Philemon Kingdom, MD  Methoxy PEG-Epoetin Beta (MIRCERA IJ) Mircera 02/10/21 02/09/22  [provider]  metolazone (ZAROXOLYN) 5 MG tablet  04/05/20   [provider]  omeprazole (PRILOSEC) 20 MG capsule Take 2 capsules (40 mg total) by mouth 2 (two) times daily before a meal. 11/14/20 12/14/20  Badalamente, Rudell Cobb, PA-C  oxycodone (OXY-IR) 5 MG capsule Take 1 capsule (5 mg total) by mouth every 4 (four) hours as needed for pain. 11/19/20   Gatha Mayer, MD  promethazine (PHENERGAN) 25 MG tablet Take 1 tablet (25 mg total) by mouth  every 6 (six) hours as needed for nausea or vomiting. 11/19/20   Gatha Mayer, MD  SENSIPAR 30 MG tablet Take 30 mg by mouth daily. 09/22/20   [provider]  sevelamer carbonate (RENVELA) 800 MG tablet Take 1 tablet (800 mg total) by mouth 3 (three) times daily with meals. 05/29/20   Daisy Floro, DO  sucralfate (CARAFATE) 1 g tablet Take 1 tablet (1 g total) by mouth 4 (four) times daily -  with meals and at bedtime. 11/14/20 12/14/20  Loni Beckwith, PA-C    Allergies    Patient has no known allergies.  Review of Systems   Review of Systems  Constitutional:  Negative for appetite change and fever.  Respiratory:  Negative for shortness of breath.   Gastrointestinal:  Positive for abdominal pain, nausea and vomiting.  Genitourinary:  Negative for flank pain.  Skin:  Negative for rash.  Neurological:  Negative for weakness.  Psychiatric/Behavioral:  Negative for confusion.    Physical Exam Updated Vital Signs BP (!) 158/92   Pulse 76   Temp 98.2 F (36.8 C) (Oral)   Resp 20   SpO2 100%   Physical Exam Vitals reviewed.  Constitutional:      Comments: Appears to be sleeping in bed.  When awake and starts complaining about severe abdominal pain.  HENT:     Head: Atraumatic.  Cardiovascular:     Rate and Rhythm: Normal rate and regular rhythm.  Chest:     Chest wall: No tenderness.  Abdominal:     Tenderness: There is no abdominal tenderness.     Hernia: No hernia is present.  Skin:    General: Skin is warm.   Neurological:     Mental Status: He is alert and oriented to person, place, and time.    ED Results / Procedures / Treatments   Labs (all labs ordered are listed, but only abnormal results are displayed) Labs Reviewed  LIPASE, BLOOD - Abnormal; Notable for the following components:      Result Value   Lipase 65 (*)    All other components within normal limits  COMPREHENSIVE METABOLIC PANEL - Abnormal; Notable for the following components:   BUN 46 (*)    Creatinine, Ser 15.44 (*)    AST 61 (*)    ALT 85 (*)    GFR, Estimated 3 (*)    Anion gap 17 (*)    All other components within normal limits  CBC - Abnormal; Notable for the following components:   RBC 2.86 (*)    Hemoglobin 10.3 (*)    HCT 30.0 (*)    MCV 104.9 (*)    MCH 36.0 (*)    All other components within normal limits  URINALYSIS, ROUTINE W REFLEX MICROSCOPIC  ETHANOL    EKG None  Radiology No results found.  Procedures Procedures   Medications Ordered in ED Medications  metoCLOPramide (REGLAN) injection 10 mg (10 mg Intravenous Given 04/21/21 0840)  fentaNYL (SUBLIMAZE) injection 50 mcg (50 mcg Intravenous Given 04/21/21 0840)    ED Course  I have reviewed the triage vital signs and the nursing notes.  Pertinent labs & imaging results that were available during my care of the patient were reviewed by me and considered in my medical decision making (see chart for details).    MDM Rules/Calculators/A&P  Final Clinical Impression(s) / ED Diagnoses Final diagnoses:  None  Full oral patient acute on chronic abdominal pain.  Same pain he has had previously.  Missed dialysis today and on Monday.  Lab work appears fairly stable to baseline.  Given Reglan since it had the diagnosis of a diabetic nerve damage.  Also given small dose pain medicine.  Turned over to [facility]  Rx / DC Orders ED Discharge Orders     None        Davonna Belling, MD 04/21/21 1000

## 2021-04-21 NOTE — Progress Notes (Signed)
Out Patient Arrangements:  Have been requested to see if pt can receive tx today @ his home clinic, [facility] [phone]). Spoke w/ RN Gershon Mussel & he said they can. They would like the pt there as soon as possible.   Linus Orn [facility] [phone]

## 2021-04-21 NOTE — ED Provider Notes (Signed)
  Physical Exam  BP 138/76   Pulse 78   Temp 98.2 F (36.8 C) (Oral)   Resp 20   SpO2 97%   Physical Exam Vitals and nursing note reviewed.  Constitutional:      General: He is not in acute distress.    Appearance: He is not ill-appearing, toxic-appearing or diaphoretic.  HENT:     Head: Normocephalic.  Eyes:     General: No scleral icterus.       Right eye: No discharge.        Left eye: No discharge.  Cardiovascular:     Rate and Rhythm: Normal rate.  Pulmonary:     Effort: Pulmonary effort is normal. No tachypnea, bradypnea or respiratory distress.  Abdominal:     General: Bowel sounds are normal. There is no distension. There are no signs of injury.     Palpations: Abdomen is soft. There is no mass or pulsatile mass.     Tenderness: There is generalized abdominal tenderness. There is no guarding or rebound.     Hernia: There is no hernia in the umbilical area or ventral area.     Comments: Mild generalized tenderness  Skin:    General: Skin is warm and dry.  Neurological:     General: No focal deficit present.     Mental Status: He is alert.  Psychiatric:        Behavior: Behavior is cooperative.    ED Course/Procedures     Procedures  MDM  Care of patient assumed from Dr. Alvino Chapel at 1000.  Agree with history, physical exam and plan.  See their note for further details. Briefly, patient presents with abdominal pain which has been ongoing for several days.  Patient has history of the same thought to be due to "diabetic nerve damage."  Patient endorses vomiting.  Patient is a dialysis patient states that last dialysis was Friday.  Patient received Reglan and small dose of pain medication.  Lab work appears fairly stable to baseline.  Plan to reassess patient and discharge if improved.  Will contact dialysis coordinator to make sure that patient can receive dialysis tomorrow.  After waking from comfortable sleep patient reports continued abdominal pain.  We will  give patient another round of pain medication as well as Ativan.  Patient is requesting something to drink.  Will p.o. challenge.  Spoke to dialysis coordinator Emmit Oriley.  Patient will be able to go to his dialysis center today for regular planning treatment.  Patient sleeping comfortably in no acute distress.  Easily arousable to voice.  Will discharge patient at this time.  Patient advised to follow-up with gastroenterology.  Patient given strict return precautions.  Patient expressed understanding of instructions and is agreeable with plan.         Loni Beckwith, PA-C 04/21/21 1132    Lajean Saver, MD 04/26/21 [phone]

## 2021-04-23 DIAGNOSIS — N2581 Secondary hyperparathyroidism of renal origin: Secondary | ICD-10-CM | POA: Diagnosis not present

## 2021-04-23 DIAGNOSIS — N186 End stage renal disease: Secondary | ICD-10-CM | POA: Diagnosis not present

## 2021-04-23 DIAGNOSIS — Z992 Dependence on renal dialysis: Secondary | ICD-10-CM | POA: Diagnosis not present

## 2021-04-26 DIAGNOSIS — Z1152 Encounter for screening for COVID-19: Secondary | ICD-10-CM | POA: Diagnosis not present

## 2021-04-26 DIAGNOSIS — N186 End stage renal disease: Secondary | ICD-10-CM | POA: Diagnosis not present

## 2021-04-26 DIAGNOSIS — N2581 Secondary hyperparathyroidism of renal origin: Secondary | ICD-10-CM | POA: Diagnosis not present

## 2021-04-26 DIAGNOSIS — Z992 Dependence on renal dialysis: Secondary | ICD-10-CM | POA: Diagnosis not present

## 2021-04-27 ENCOUNTER — Telehealth: Payer: Self-pay

## 2021-04-27 NOTE — Telephone Encounter (Signed)
Referral notes sent from [facility], Phone #: [phone], Fax #: [phone]   Notes sent to scheduling

## 2021-04-28 DIAGNOSIS — N2581 Secondary hyperparathyroidism of renal origin: Secondary | ICD-10-CM | POA: Diagnosis not present

## 2021-04-28 DIAGNOSIS — N186 End stage renal disease: Secondary | ICD-10-CM | POA: Diagnosis not present

## 2021-04-28 DIAGNOSIS — Z1152 Encounter for screening for COVID-19: Secondary | ICD-10-CM | POA: Diagnosis not present

## 2021-04-28 DIAGNOSIS — J9621 Acute and chronic respiratory failure with hypoxia: Secondary | ICD-10-CM | POA: Diagnosis not present

## 2021-04-28 DIAGNOSIS — U071 COVID-19: Secondary | ICD-10-CM | POA: Diagnosis not present

## 2021-04-28 DIAGNOSIS — Z992 Dependence on renal dialysis: Secondary | ICD-10-CM | POA: Diagnosis not present

## 2021-04-30 DIAGNOSIS — N2581 Secondary hyperparathyroidism of renal origin: Secondary | ICD-10-CM | POA: Diagnosis not present

## 2021-04-30 DIAGNOSIS — Z992 Dependence on renal dialysis: Secondary | ICD-10-CM | POA: Diagnosis not present

## 2021-04-30 DIAGNOSIS — N186 End stage renal disease: Secondary | ICD-10-CM | POA: Diagnosis not present

## 2021-05-02 DIAGNOSIS — N186 End stage renal disease: Secondary | ICD-10-CM | POA: Diagnosis not present

## 2021-05-02 DIAGNOSIS — E1129 Type 2 diabetes mellitus with other diabetic kidney complication: Secondary | ICD-10-CM | POA: Diagnosis not present

## 2021-05-02 DIAGNOSIS — Z992 Dependence on renal dialysis: Secondary | ICD-10-CM | POA: Diagnosis not present

## 2021-05-03 DIAGNOSIS — N2581 Secondary hyperparathyroidism of renal origin: Secondary | ICD-10-CM | POA: Diagnosis not present

## 2021-05-03 DIAGNOSIS — Z992 Dependence on renal dialysis: Secondary | ICD-10-CM | POA: Diagnosis not present

## 2021-05-03 DIAGNOSIS — N186 End stage renal disease: Secondary | ICD-10-CM | POA: Diagnosis not present

## 2021-05-05 DIAGNOSIS — N2581 Secondary hyperparathyroidism of renal origin: Secondary | ICD-10-CM | POA: Diagnosis not present

## 2021-05-05 DIAGNOSIS — Z992 Dependence on renal dialysis: Secondary | ICD-10-CM | POA: Diagnosis not present

## 2021-05-05 DIAGNOSIS — Z1152 Encounter for screening for COVID-19: Secondary | ICD-10-CM | POA: Diagnosis not present

## 2021-05-05 DIAGNOSIS — N186 End stage renal disease: Secondary | ICD-10-CM | POA: Diagnosis not present

## 2021-05-07 DIAGNOSIS — N186 End stage renal disease: Secondary | ICD-10-CM | POA: Diagnosis not present

## 2021-05-07 DIAGNOSIS — Z992 Dependence on renal dialysis: Secondary | ICD-10-CM | POA: Diagnosis not present

## 2021-05-07 DIAGNOSIS — N2581 Secondary hyperparathyroidism of renal origin: Secondary | ICD-10-CM | POA: Diagnosis not present

## 2021-05-09 ENCOUNTER — Other Ambulatory Visit: Payer: Self-pay | Admitting: Internal Medicine

## 2021-05-10 DIAGNOSIS — Z992 Dependence on renal dialysis: Secondary | ICD-10-CM | POA: Diagnosis not present

## 2021-05-10 DIAGNOSIS — N186 End stage renal disease: Secondary | ICD-10-CM | POA: Diagnosis not present

## 2021-05-10 DIAGNOSIS — N2581 Secondary hyperparathyroidism of renal origin: Secondary | ICD-10-CM | POA: Diagnosis not present

## 2021-05-10 NOTE — Telephone Encounter (Signed)
Tried to catch him since its after 5. Had to leave another message.

## 2021-05-10 NOTE — Telephone Encounter (Signed)
I called him and left a message for him to call me back. He no showed his Nicoletta Ba PA-C appointment in late June. I need to get him back on the books and confirm the dosage he is taking. Should be 50mg  nightly at this point according to the notes.

## 2021-05-12 DIAGNOSIS — N2581 Secondary hyperparathyroidism of renal origin: Secondary | ICD-10-CM | POA: Diagnosis not present

## 2021-05-12 DIAGNOSIS — Z992 Dependence on renal dialysis: Secondary | ICD-10-CM | POA: Diagnosis not present

## 2021-05-12 DIAGNOSIS — N186 End stage renal disease: Secondary | ICD-10-CM | POA: Diagnosis not present

## 2021-05-12 NOTE — Telephone Encounter (Signed)
There is no sense refilling the amitriptyline if he does not think it is helping at this point  He can come in for an appointment with me you can use a work and spot sometime in August I do not think calling over the phone will be sufficient

## 2021-05-12 NOTE — Telephone Encounter (Signed)
Edward [name] called back and said he missed his appointment with Edward [name] due to sickness. He is still having stomach problems and he said the amitriptyline is not helping. He wants to talk to Edward [name]. He does dialysis M-W-F but can be reached at [phone]. Please call or advise if needs to be worked in.  Also if the amitriptyline needs to be refilled can I send in a 50mg  tablet so he would just have to take one QHS.

## 2021-05-12 NOTE — Telephone Encounter (Signed)
I spoke with Edward [name] and told him we won't refill the amitriptyline since not helping and we have had a cancellation tomorrow so he is going to come then.

## 2021-05-13 ENCOUNTER — Other Ambulatory Visit: Payer: Medicare HMO

## 2021-05-13 ENCOUNTER — Ambulatory Visit (INDEPENDENT_AMBULATORY_CARE_PROVIDER_SITE_OTHER): Payer: Medicare HMO | Admitting: Internal Medicine

## 2021-05-13 ENCOUNTER — Encounter: Payer: Self-pay | Admitting: Internal Medicine

## 2021-05-13 VITALS — BP 94/68 | HR 71 | Ht 72.0 in | Wt 185.4 lb

## 2021-05-13 DIAGNOSIS — R1033 Periumbilical pain: Secondary | ICD-10-CM

## 2021-05-13 DIAGNOSIS — M2550 Pain in unspecified joint: Secondary | ICD-10-CM | POA: Diagnosis not present

## 2021-05-13 DIAGNOSIS — M5126 Other intervertebral disc displacement, lumbar region: Secondary | ICD-10-CM | POA: Diagnosis not present

## 2021-05-13 DIAGNOSIS — K529 Noninfective gastroenteritis and colitis, unspecified: Secondary | ICD-10-CM | POA: Diagnosis not present

## 2021-05-13 DIAGNOSIS — R634 Abnormal weight loss: Secondary | ICD-10-CM | POA: Diagnosis not present

## 2021-05-13 MED ORDER — DICLOFENAC SODIUM 1 % EX GEL: 4.0000 g | Freq: Four times a day (QID) | CUTANEOUS | 1 refills | Status: DC

## 2021-05-13 MED ORDER — OXYCODONE HCL 5 MG PO TABS: 5.0000 mg | ORAL_TABLET | ORAL | 0 refills | Status: DC | PRN

## 2021-05-13 MED ORDER — PROMETHAZINE HCL 25 MG PO TABS: 25.0000 mg | ORAL_TABLET | Freq: Four times a day (QID) | ORAL | 0 refills | Status: DC | PRN

## 2021-05-13 NOTE — Progress Notes (Signed)
Edward [name] Sr. 50 y.o. 1971/02/07 [id number]  Assessment & Plan:   Encounter Diagnoses  Name Primary?   Periumbilical abdominal pain Yes   Loss of weight    Chronic diarrhea    Arthralgia, unspecified joint    Herniated lumbar intervertebral disc     His symptomatology sounds very much like mesenteric ischemia but previous imaging showed patent vessels.  Question if a Doppler study would be helpful.  I am going to look into see if I can order that.  That is a more functional study.  Limited supply of pain medicines he should also consider Voltaren gel for his joint pains and will do a capsule study of the small bowel.  I am also checking fecal calprotectin for inflammation and I am not doing an elastase given that he has watery stools but a qualitative fecal fat that I expect to be negative but I think is important to do.  I have explained I do not intend to provide chronic pain medication he is not indicated a desire for that either but he does get bad spells that could be aborted or treated at home and he could avoid an ER visit so I am inclined to prescribe as below.  Orders Placed This Encounter  Procedures   Calprotectin, Fecal   Fecal Fat, Qualitative   Ambulatory referral to Gastroenterology      Meds ordered this encounter  Medications   oxyCODONE (OXY IR/ROXICODONE) 5 MG immediate release tablet    Sig: Take 1 tablet (5 mg total) by mouth every 4 (four) hours as needed for severe pain.    Dispense:  15 tablet    Refill:  0   diclofenac Sodium (VOLTAREN) 1 % GEL    Sig: Apply 4 g topically 4 (four) times daily.    Dispense:  100 g    Refill:  1   promethazine (PHENERGAN) 25 MG tablet    Sig: Take 1 tablet (25 mg total) by mouth every 6 (six) hours as needed for nausea or vomiting.    Dispense:  30 tablet    Refill:  0    Subjective:   Chief Complaint:  HPI [name] returns for follow-up telling me that amitriptyline and gabapentin did not  help his chronic recurrent periumbilical pain.  He has very intense postprandial abdominal pain that can be quite disabling when it occurs.  He has been in and out of the ER several times with says many times he does not go because of the difficulty of that.  His bowel movements are always loose and usually watery they do not sound like stearrhea.  The pain is not associated with dialysis.  It is anytime he eats and it is a severe stabbing pain in the lower abdomen and periumbilical area.  He moves his bowels about 4-5 times in 24 hours.  He has tried smoking marijuana to help with appetite and that has not helped.  So he is not really using that much at all.  There is some nausea.  Wt Readings from Last 3 Encounters:  05/13/21 185 lb 6 oz (84.1 kg)  01/19/21 200 lb (90.7 kg)  12/29/20 200 lb (90.7 kg)     No Known Allergies Current Meds  Medication Sig   Accu-Chek Softclix Lancets lancets Use as instructed   acetaminophen (TYLENOL) 500 MG tablet Take 500 mg by mouth every 6 (six) hours as needed.   Alcohol Swabs (B-D SINGLE USE SWABS REGULAR) PADS Use  as directed.   atorvastatin (LIPITOR) 40 MG tablet Take 1 tablet (40 mg total) by mouth daily.   Blood Glucose Monitoring Suppl (ACCU-CHEK GUIDE ME) w/Device KIT Use 3 times daily before meals   cinacalcet (SENSIPAR) 90 MG tablet Take 90 mg by mouth daily.   Emollient (EUCERIN ADVANCED REPAIR) CREA Apply to dry skin as needed   furosemide (LASIX) 80 MG tablet Take 80 mg by mouth 2 (two) times daily.   glipiZIDE (GLUCOTROL XL) 2.5 MG 24 hr tablet Take by mouth daily.   glucose blood (ACCU-CHEK GUIDE) test strip Use as instructed   ketoconazole (NIZORAL) 2 % cream Apply 1 application topically daily.   levothyroxine (SYNTHROID) 137 MCG tablet TAKE 1 TABLET(137 MCG) BY MOUTH DAILY BEFORE BREAKFAST   Methoxy PEG-Epoetin Beta (MIRCERA IJ) Mircera   metolazone (ZAROXOLYN) 5 MG tablet        SENSIPAR 30 MG tablet Take 30 mg by mouth daily.    sevelamer carbonate (RENVELA) 800 MG tablet Take 1 tablet (800 mg total) by mouth 3 (three) times daily with meals.                       Past Medical History:  Diagnosis Date   Acute on chronic renal failure ([place]) 11/25/2015   Anemia    Arthritis    Blind    Left eye   Cataract    CHF (congestive heart failure) (HCC)    Chronic kidney disease    stage 5   COVID-19    Depression    situatuional   Diabetes mellitus    Type II   GERD (gastroesophageal reflux disease)    Graves disease 2014   Headache    in past   Hx of adenomatous and sessile serrated colonic polyps 11/21/2019   Hyperlipidemia    Hypertension    Hypothyroidism    Legally blind    right eye   Neuropathy    Non-compliance    Proteinuria 05/24/2020   Shortness of breath dyspnea    "Better since I've been taking my medications"   Past Surgical History:  Procedure Laterality Date   AV FISTULA PLACEMENT Right 04/22/2019   Procedure: RIGHT ARM ARTERIOVENOUS (AV) FISTULA CREATION;  Surgeon: Angelia Mould, MD;  Location: [facility];  Service: Vascular;  Laterality: Right;   CIRCUMCISION     as a child, around 71 years of age   COLONOSCOPY     EYE SURGERY Bilateral    "several "   PARS PLANA VITRECTOMY Right 03/25/2016   Procedure: PARS PLANA VITRECTOMY 25 GAUGE FOR ENDOPHTHALMITIS;  Surgeon: Jalene Mullet, MD;  Location: [place];  Service: Ophthalmology;  Laterality: Right;   WISDOM TOOTH EXTRACTION     Social History   Social History Narrative   Divorced   58 children   29 yo son with him at home   Disabled chef - cafeterias, Chiropodist, [employer], etc      Former cigarette smoker, marijuana now, rare alcohol, other drugs   family history includes Bladder Cancer in his brother; Diabetes in his mother; Hypertension in his mother; Kidney disease in an other family member.   Review of Systems As above  Objective:   Physical Exam BP 94/68 (BP Location: Left Arm, Patient Position: Sitting, Cuff Size:  Normal)   Pulse 71   Ht 6' (1.829 m)   Wt 185 lb 6 oz (84.1 kg)   SpO2 100%   BMI 25.14 kg/m  Chronically  ill Mild periumbilical tenderness to RLQ - neg [place] No bruits

## 2021-05-13 NOTE — Patient Instructions (Signed)
Your provider has requested that you go to the basement level for lab work before leaving today. Press "B" on the elevator. The lab is located at the first door on the left as you exit the elevator.  Due to recent changes in healthcare laws, you may see the results of your imaging and laboratory studies on MyChart before your provider has had a chance to review them.  We understand that in some cases there may be results that are confusing or concerning to you. Not all laboratory results come back in the same time frame and the provider may be waiting for multiple results in order to interpret others.  Please give [place] 48 hours in order for your provider to thoroughly review all the results before contacting the office for clarification of your results.   We have sent medications to your pharmacy for you to pick up at your convenience.  CAPSULE ENDOSCOPY PATIENT INSTRUCTION Edward HASKIN Sr. [id number] [id number]   05/13/21 Seven (7) days prior to capsule endoscopy stop taking iron supplements and carafate.  05/16/21 Two (2) days prior to capsule endoscopy stop taking aspirin or any arthritis drugs.  05/17/21  Day before capsule endoscopy purchase a 238 gram bottle of Miralax from the laxative section of your drug store, and a 32 oz. bottle of Gatorade (no red).    05/17/21  One (1) day prior to capsule endoscopy: Stop smoking. Eat a regular diet until 12:00 Noon. After 12:00 Noon take only the following: Black coffee  Jell-O (no fruit or red Jell-o) Water   Bouillon (chicken or beef) 7-Up   Cranberry Juice Tea   Kool-Aid Popsicle (not red) Sprite   Coke Ginger Ale  Pepsi Mountain Dew Gatorade At 6:00 pm the evening before your appointment, drink 7 capfuls (105 grams) of Miralax with 32 oz. Gatorade. Drink 8 oz every 15 minutes until gone. Nothing to eat or drink after midnight except medications with a sip of water.  05/18/21 Day of capsule endoscopy:  No medications for 2  hours prior to your test.  Please arrive at [facility]  3rd floor patient registration area by 8:15 am on: 05/18/21.   For any questions: Call [place] at [phone] and ask to speak with one of the capsule endoscopy nurses.  YOU WILL NEED TO RETURN THE EQUIPMENT AT 4 PM ON THE DAY OF THE PROCEDURE.  PLEASE KEEP THIS IN MIND WHEN SCHEDULING.   The above instructions have been reviewed and explained to me by________________   Patient signature:_________________________________________     Date:________________  Small Bowel Capsule Endoscopy  What you should know: Small Bowel capsule endoscopy is a procedure that takes pictures of the inside of your small intestine (bowel).  Your small bowel connects to your stomach on one end, and your large bowel (colon) on the other.  A capsule endoscopy is done by swallowing a pill size camera.  The capsule moves through your stomach and into your small bowel, where pictures are taken.   You may need a small bowel capsule endoscopy if you have symptoms, such as blood in your stool, chronic stomach pain, and diarrhea.  The pictures may show if you have growths, swelling, and bleeding area in you small bowel.  A capsule endoscopy may also show if diseases such as Crohn's or celiac disease are causing your symptoms.  Having a small bowel capsule endoscopy may help you and your caregiver learn the cause of your symptoms.  Learning what is  causing your symptoms allows you to receive needed treatment and prevent further problems. Risks: You may have stomach pain during your procedure.   The pictures taken by the capsule may not be clear.   The pictures may not show the cause of your symptoms.   You may need another endoscopy procedure.   The capsule may get trapped in your esophagus or intestines. You may need surgery or additional procedures to remove the capsule from your body.    Before your procedure: You will be instructed to stop certain  prescription medications or over- the -counter medications prior to the procedure.   The day before your scheduled appointment you will need to be on a restricted diet and will need to drink a bowel prep that will clean out your bowels.   The day of the procedure: You may drive yourself to the procedure.   You will need to plan on 2 trips to the office on the day of the procedure. Morning: Plan to be at the office about 45 minutes. The morning of the procedure a sensor belt and recorder will be placed on you.  You will wear this for 8 hours.  (The sensor belt transfers pictures of your small bowel to the recorder.)   You will be given a pill-sized capsule endoscope to swallow.  Once you swallow the capsule it will travel through your body the same way food does, constantly taking pictures along the way.  The capsule takes 2-3 pictures a second.   Once you have left the office you may go about your normal day with a few exceptions: You may not go near a MRI machine or a radio or television towers; You need to avoid other patients having capsule endoscopy; You will be given a written diet to follow for the day.  Afternoon: You will need to be return to the office at your designated time. The sensors belt will be removed You will need to be at the office about 15 minutes.  I appreciate the opportunity to care for you. Silvano Rusk, MD, [facility]

## 2021-05-14 ENCOUNTER — Telehealth: Payer: Self-pay

## 2021-05-14 DIAGNOSIS — N186 End stage renal disease: Secondary | ICD-10-CM | POA: Diagnosis not present

## 2021-05-14 DIAGNOSIS — Z992 Dependence on renal dialysis: Secondary | ICD-10-CM | POA: Diagnosis not present

## 2021-05-14 DIAGNOSIS — N2581 Secondary hyperparathyroidism of renal origin: Secondary | ICD-10-CM | POA: Diagnosis not present

## 2021-05-14 NOTE — Telephone Encounter (Signed)
I have left several messages for him to call me back. We need to reschedule his capsule study due to insurance prior authorization needed takes awhile. I left him a detailed message with my # and told him not to do any prepping.  I have called his son's # as well and left a detailed message, his name is Mort also.

## 2021-05-17 DIAGNOSIS — N2581 Secondary hyperparathyroidism of renal origin: Secondary | ICD-10-CM | POA: Diagnosis not present

## 2021-05-17 DIAGNOSIS — Z992 Dependence on renal dialysis: Secondary | ICD-10-CM | POA: Diagnosis not present

## 2021-05-17 DIAGNOSIS — N186 End stage renal disease: Secondary | ICD-10-CM | POA: Diagnosis not present

## 2021-05-17 NOTE — Telephone Encounter (Signed)
I was able to reach Edward [name] and we have r/s'ed the capsule study to 06/01/21 at 8:30am. I will mail him new instructions. I confirmed the address and I told him about his f/u appointment in September-06/29/21 at 3:10pm.

## 2021-05-19 ENCOUNTER — Telehealth: Payer: Self-pay

## 2021-05-19 DIAGNOSIS — N2581 Secondary hyperparathyroidism of renal origin: Secondary | ICD-10-CM | POA: Diagnosis not present

## 2021-05-19 DIAGNOSIS — N186 End stage renal disease: Secondary | ICD-10-CM | POA: Diagnosis not present

## 2021-05-19 DIAGNOSIS — Z992 Dependence on renal dialysis: Secondary | ICD-10-CM | POA: Diagnosis not present

## 2021-05-19 NOTE — Telephone Encounter (Signed)
I was told by central scheduling that the vascular department has to set up the special ultra sound Dr Carlean Purl ordered. Their # is [phone]. I will try to reach them in the AM. Sajan is aware of the plan.

## 2021-05-20 ENCOUNTER — Other Ambulatory Visit (HOSPITAL_COMMUNITY): Payer: Self-pay | Admitting: Nephrology

## 2021-05-20 DIAGNOSIS — I3139 Other pericardial effusion (noninflammatory): Secondary | ICD-10-CM

## 2021-05-20 DIAGNOSIS — I313 Pericardial effusion (noninflammatory): Secondary | ICD-10-CM

## 2021-05-20 NOTE — Telephone Encounter (Signed)
Spoke with Legrand Como and he got my message about his appointment date/time.

## 2021-05-20 NOTE — Telephone Encounter (Signed)
Left a detailed message about the ultra sound appointment on this # [phone]. At [facility] on 05/25/21 at 1:00pm, arrive at 12:45pm. Go in Entrance C, Heart/Vascular Entrance off [facility]. I will try to reach him later.

## 2021-05-21 DIAGNOSIS — Z992 Dependence on renal dialysis: Secondary | ICD-10-CM | POA: Diagnosis not present

## 2021-05-21 DIAGNOSIS — N186 End stage renal disease: Secondary | ICD-10-CM | POA: Diagnosis not present

## 2021-05-21 DIAGNOSIS — N2581 Secondary hyperparathyroidism of renal origin: Secondary | ICD-10-CM | POA: Diagnosis not present

## 2021-05-24 DIAGNOSIS — N186 End stage renal disease: Secondary | ICD-10-CM | POA: Diagnosis not present

## 2021-05-24 DIAGNOSIS — N2581 Secondary hyperparathyroidism of renal origin: Secondary | ICD-10-CM | POA: Diagnosis not present

## 2021-05-24 DIAGNOSIS — Z992 Dependence on renal dialysis: Secondary | ICD-10-CM | POA: Diagnosis not present

## 2021-05-25 ENCOUNTER — Ambulatory Visit (HOSPITAL_COMMUNITY)
Admission: RE | Admit: 2021-05-25 | Discharge: 2021-05-25 | Disposition: A | Discharge status: Home / Self Care | Payer: Medicare HMO | Source: Ambulatory Visit | Attending: Internal Medicine | Admitting: Internal Medicine

## 2021-05-25 ENCOUNTER — Ambulatory Visit (HOSPITAL_COMMUNITY): Admission: RE | Admit: 2021-05-25 | Payer: Medicare HMO | Source: Ambulatory Visit

## 2021-05-25 ENCOUNTER — Other Ambulatory Visit: Payer: Self-pay

## 2021-05-25 DIAGNOSIS — R634 Abnormal weight loss: Secondary | ICD-10-CM | POA: Insufficient documentation

## 2021-05-25 DIAGNOSIS — R1033 Periumbilical pain: Secondary | ICD-10-CM | POA: Insufficient documentation

## 2021-05-25 NOTE — Progress Notes (Signed)
Mesenteric study has been completed.   Preliminary results in CV Proc.   Edward [name] 05/25/2021 11:29 AM

## 2021-05-26 DIAGNOSIS — Z992 Dependence on renal dialysis: Secondary | ICD-10-CM | POA: Diagnosis not present

## 2021-05-26 DIAGNOSIS — N2581 Secondary hyperparathyroidism of renal origin: Secondary | ICD-10-CM | POA: Diagnosis not present

## 2021-05-26 DIAGNOSIS — N186 End stage renal disease: Secondary | ICD-10-CM | POA: Diagnosis not present

## 2021-05-27 ENCOUNTER — Other Ambulatory Visit: Payer: Self-pay

## 2021-05-27 ENCOUNTER — Encounter (HOSPITAL_COMMUNITY): Payer: Self-pay | Admitting: Emergency Medicine

## 2021-05-27 ENCOUNTER — Emergency Department (HOSPITAL_COMMUNITY)
Admission: EM | Admit: 2021-05-27 | Discharge: 2021-05-27 | Disposition: A | Discharge status: Home / Self Care | Payer: Medicare HMO | Attending: Emergency Medicine | Admitting: Emergency Medicine

## 2021-05-27 DIAGNOSIS — E1169 Type 2 diabetes mellitus with other specified complication: Secondary | ICD-10-CM | POA: Insufficient documentation

## 2021-05-27 DIAGNOSIS — Z7984 Long term (current) use of oral hypoglycemic drugs: Secondary | ICD-10-CM | POA: Insufficient documentation

## 2021-05-27 DIAGNOSIS — E1122 Type 2 diabetes mellitus with diabetic chronic kidney disease: Secondary | ICD-10-CM | POA: Diagnosis not present

## 2021-05-27 DIAGNOSIS — E1140 Type 2 diabetes mellitus with diabetic neuropathy, unspecified: Secondary | ICD-10-CM | POA: Diagnosis not present

## 2021-05-27 DIAGNOSIS — Z87891 Personal history of nicotine dependence: Secondary | ICD-10-CM | POA: Insufficient documentation

## 2021-05-27 DIAGNOSIS — I132 Hypertensive heart and chronic kidney disease with heart failure and with stage 5 chronic kidney disease, or end stage renal disease: Secondary | ICD-10-CM | POA: Diagnosis not present

## 2021-05-27 DIAGNOSIS — E11319 Type 2 diabetes mellitus with unspecified diabetic retinopathy without macular edema: Secondary | ICD-10-CM | POA: Insufficient documentation

## 2021-05-27 DIAGNOSIS — Z79899 Other long term (current) drug therapy: Secondary | ICD-10-CM | POA: Insufficient documentation

## 2021-05-27 DIAGNOSIS — E890 Postprocedural hypothyroidism: Secondary | ICD-10-CM | POA: Insufficient documentation

## 2021-05-27 DIAGNOSIS — E785 Hyperlipidemia, unspecified: Secondary | ICD-10-CM | POA: Diagnosis not present

## 2021-05-27 DIAGNOSIS — R109 Unspecified abdominal pain: Secondary | ICD-10-CM | POA: Diagnosis not present

## 2021-05-27 DIAGNOSIS — Z8616 Personal history of COVID-19: Secondary | ICD-10-CM | POA: Insufficient documentation

## 2021-05-27 DIAGNOSIS — E1136 Type 2 diabetes mellitus with diabetic cataract: Secondary | ICD-10-CM | POA: Insufficient documentation

## 2021-05-27 DIAGNOSIS — I5032 Chronic diastolic (congestive) heart failure: Secondary | ICD-10-CM | POA: Diagnosis not present

## 2021-05-27 DIAGNOSIS — N186 End stage renal disease: Secondary | ICD-10-CM | POA: Insufficient documentation

## 2021-05-27 DIAGNOSIS — R1084 Generalized abdominal pain: Secondary | ICD-10-CM | POA: Insufficient documentation

## 2021-05-27 DIAGNOSIS — R197 Diarrhea, unspecified: Secondary | ICD-10-CM | POA: Diagnosis not present

## 2021-05-27 LAB — COMPREHENSIVE METABOLIC PANEL
ALT: 83 U/L — ABNORMAL HIGH (ref 0–44)
AST: 66 U/L — ABNORMAL HIGH (ref 15–41)
Albumin: 5.3 g/dL — ABNORMAL HIGH (ref 3.5–5.0)
Alkaline Phosphatase: 71 U/L (ref 38–126)
Anion gap: 17 — ABNORMAL HIGH (ref 5–15)
BUN: 18 mg/dL (ref 6–20)
CO2: 33 mmol/L — ABNORMAL HIGH (ref 22–32)
Calcium: 10.5 mg/dL — ABNORMAL HIGH (ref 8.9–10.3)
Chloride: 92 mmol/L — ABNORMAL LOW (ref 98–111)
Creatinine, Ser: 8.63 mg/dL — ABNORMAL HIGH (ref 0.61–1.24)
GFR, Estimated: 7 mL/min — ABNORMAL LOW (ref >60)
Glucose, Bld: 108 mg/dL — ABNORMAL HIGH (ref 70–99)
Potassium: 3.7 mmol/L (ref 3.5–5.1)
Sodium: 142 mmol/L (ref 135–145)
Total Bilirubin: 0.9 mg/dL (ref 0.3–1.2)
Total Protein: 9.4 g/dL — ABNORMAL HIGH (ref 6.5–8.1)

## 2021-05-27 LAB — CBC WITH DIFFERENTIAL/PLATELET
Abs Immature Granulocytes: 0.02 10*3/uL (ref 0.00–0.07)
Basophils Absolute: 0 10*3/uL (ref 0.0–0.1)
Basophils Relative: 0 %
Eosinophils Absolute: 0 10*3/uL (ref 0.0–0.5)
Eosinophils Relative: 0 %
HCT: 38.4 % — ABNORMAL LOW (ref 39.0–52.0)
Hemoglobin: 13.4 g/dL (ref 13.0–17.0)
Immature Granulocytes: 0 %
Lymphocytes Relative: 15 %
Lymphs Abs: 0.8 10*3/uL (ref 0.7–4.0)
MCH: 35.9 pg — ABNORMAL HIGH (ref 26.0–34.0)
MCHC: 34.9 g/dL (ref 30.0–36.0)
MCV: 102.9 fL — ABNORMAL HIGH (ref 80.0–100.0)
Monocytes Absolute: 0.2 10*3/uL (ref 0.1–1.0)
Monocytes Relative: 3 %
Neutro Abs: 4.1 10*3/uL (ref 1.7–7.7)
Neutrophils Relative %: 82 %
Platelets: 189 10*3/uL (ref 150–400)
RBC: 3.73 MIL/uL — ABNORMAL LOW (ref 4.22–5.81)
RDW: 13.7 % (ref 11.5–15.5)
WBC: 5 10*3/uL (ref 4.0–10.5)
nRBC: 0 % (ref 0.0–0.2)

## 2021-05-27 MED ORDER — HYDROMORPHONE HCL 1 MG/ML IJ SOLN
1.0000 mg | Freq: Once | INTRAMUSCULAR | Status: AC
  Administered 2021-05-27: 1 mg via INTRAVENOUS
  Filled 2021-05-27: qty 1

## 2021-05-27 MED ORDER — SODIUM CHLORIDE 0.9 % IV BOLUS
500.0000 mL | Freq: Once | INTRAVENOUS | Status: AC
  Administered 2021-05-27: 500 mL via INTRAVENOUS

## 2021-05-27 MED ORDER — PANTOPRAZOLE SODIUM 40 MG IV SOLR
40.0000 mg | Freq: Once | INTRAVENOUS | Status: AC
  Administered 2021-05-27: 40 mg via INTRAVENOUS
  Filled 2021-05-27: qty 40

## 2021-05-27 NOTE — ED Notes (Signed)
Per EMS-states he "slept the entire ride here"

## 2021-05-27 NOTE — Discharge Instructions (Addendum)
You have been seen and discharged from the emergency department.  Follow-up with your gastroenterologist for reevaluation and further care. Take home medications as prescribed. If you have any worsening symptoms or further concerns for your health please return to an emergency department for further evaluation.

## 2021-05-27 NOTE — ED Notes (Signed)
Attempting to get an IV in the patient- meanwhile he is rocking back and forth in the bed and then will immediately fall back to sleep.  Screaming and moaning at times

## 2021-05-27 NOTE — ED Triage Notes (Signed)
Patient arrives via EMS- patient reports abd pain x 1 year.  He reports that he has been to different hospitals and no one can tell him what is wrong Patient denies alcohol use but smoke marijuana

## 2021-05-27 NOTE — ED Provider Notes (Signed)
Edward [name]   CSN: [id number] Arrival date & time: 05/27/21  0705     History Chief Complaint  Patient presents with   Abdominal Pain    Edward JOVEL Sr. is a 50 y.o. male.  HPI  50 year old male with ESRD on HD, CHF, DM, HTN, HLD presents to the emergency department with acute on chronic abdominal pain.  Patient has been evaluated for this abdominal pain extensively in the past, multiple times in this department.  He follows up with Dr. Carlean Purl for gastroenterology.  They have been doing outpatient imaging including a recent CTA which ruled out ischemia and a recent ultrasound 2 days ago which showed patent arteries.  Patient is being treated symptomatically as an outpatient but frequently ends up in the ER for exacerbations.  He states has been trying to take his prescriptions at home but has not had any relief.  He denies any acute fever, vomiting or diarrhea.  He has outpatient follow-up in a couple days with GI.  Requesting symptomatic treatment for his pain.  Past Medical History:  Diagnosis Date   Acute on chronic renal failure ([place]) 11/25/2015   Anemia    Arthritis    Blind    Left eye   Cataract    CHF (congestive heart failure) (HCC)    Chronic kidney disease    stage 5   COVID-19    Depression    situatuional   Diabetes mellitus    Type II   GERD (gastroesophageal reflux disease)    Graves disease 2014   Headache    in past   Hx of adenomatous and sessile serrated colonic polyps 11/21/2019   Hyperlipidemia    Hypertension    Hypothyroidism    Legally blind    right eye   Neuropathy    Non-compliance    Proteinuria 05/24/2020   Shortness of breath dyspnea    "Better since I've been taking my medications"    Patient Active Problem List   Diagnosis Date Noted   ESRD (end stage renal disease) on dialysis ([place])    Hypertension associated with stage 5 chronic kidney disease due to type 2 diabetes  mellitus ([place]) 05/24/2020   Hyperphosphatemia 05/24/2020   Proteinuria 05/24/2020   Legally blind 05/24/2020   Diabetic retinopathy of both eyes ([place]) 05/24/2020   Pneumonia due to COVID-19 virus 05/23/2020   Chronic diastolic heart failure ([place]) 10/24/2018   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   Mitral regurgitation, Moderate 09/05/2018   ED (erectile dysfunction) 08/10/2017   Chronic kidney disease, stage 5 ([place]) 07/18/2017   Graves disease 03/02/2016   Non compliance with medical treatment 11/26/2015   Uncontrolled hypertension 11/26/2015   Acute on chronic renal failure ([place]) 11/25/2015   DM (diabetes mellitus), type 2 with ophthalmic complications ([place]) 03/75/4360    Past Surgical History:  Procedure Laterality Date   AV FISTULA PLACEMENT Right 04/22/2019   Procedure: RIGHT ARM ARTERIOVENOUS (AV) FISTULA CREATION;  Surgeon: Angelia Mould, MD;  Location: [facility] OR;  Service: Vascular;  Laterality: Right;   CIRCUMCISION     as a child, around 61 years of age   COLONOSCOPY     EYE SURGERY Bilateral    "several "   PARS PLANA VITRECTOMY Right 03/25/2016   Procedure: PARS PLANA VITRECTOMY 25 GAUGE FOR ENDOPHTHALMITIS;  Surgeon: Jalene Mullet, MD;  Location: [place];  Service: Ophthalmology;  Laterality: Right;   WISDOM TOOTH EXTRACTION  Family History  Problem Relation Age of Onset   Hypertension Mother    Diabetes Mother    Bladder Cancer Brother    Kidney disease Other    Colon cancer Neg Hx    Rectal cancer Neg Hx    Esophageal cancer Neg Hx    Stomach cancer Neg Hx     Social History   Tobacco Use   Smoking status: Former    Packs/day: 0.75    Years: 3.00    Pack years: 2.25    Types: Cigarettes    Quit date: 05/28/2000    Years since quitting: 21.0   Smokeless tobacco: Never  Vaping Use   Vaping Use: Never used  Substance Use Topics   Alcohol use: Not Currently    Alcohol/week: 0.0 standard drinks    Comment: 1 beer  ocassional   Drug use:  Yes    Types: Marijuana    Comment: daily for pain    Home Medications Prior to Admission medications   Medication Sig Start Date End Date Taking? Authorizing Provider  Accu-Chek Softclix Lancets lancets Use as instructed 08/21/20   Philemon Kingdom, MD  acetaminophen (TYLENOL) 500 MG tablet Take 500 mg by mouth every 6 (six) hours as needed.    [provider]  Alcohol Swabs (B-D SINGLE USE SWABS REGULAR) PADS Use as directed. 01/09/20   [name], MD  atorvastatin (LIPITOR) 40 MG tablet Take 1 tablet (40 mg total) by mouth daily. 10/22/19   [name], MD  Blood Glucose Monitoring Suppl (ACCU-CHEK GUIDE ME) w/Device KIT Use 3 times daily before meals 08/21/20   Philemon Kingdom, MD  cinacalcet (SENSIPAR) 90 MG tablet Take 90 mg by mouth daily. 02/09/21   [provider]  diclofenac Sodium (VOLTAREN) 1 % GEL Apply 4 g topically 4 (four) times daily. 05/13/21   Gatha Mayer, MD  Emollient [facility] ADVANCED REPAIR) CREA Apply to dry skin as needed 07/17/20   Gildardo Pounds, NP  furosemide (LASIX) 80 MG tablet Take 80 mg by mouth 2 (two) times daily. 09/02/20   [provider]  glipiZIDE (GLUCOTROL XL) 2.5 MG 24 hr tablet Take by mouth daily.    [provider]  glucose blood (ACCU-CHEK GUIDE) test strip Use as instructed 08/21/20   Philemon Kingdom, MD  IRON SUCROSE IV Iron Sucrose (Venofer) 02/24/21 03/17/21  [provider]  ketoconazole (NIZORAL) 2 % cream Apply 1 application topically daily. 08/11/20   Criselda Peaches, DPM  levothyroxine (SYNTHROID) 137 MCG tablet TAKE 1 TABLET(137 MCG) BY MOUTH DAILY BEFORE BREAKFAST 08/21/20   Philemon Kingdom, MD  Methoxy PEG-Epoetin Beta (MIRCERA IJ) Mircera 02/10/21 02/09/22  [provider]  metolazone (ZAROXOLYN) 5 MG tablet  04/05/20   [provider]  oxyCODONE (OXY IR/ROXICODONE) 5 MG immediate release tablet Take 1 tablet (5 mg total) by mouth every 4 (four) hours as needed  for severe pain. 05/13/21   Gatha Mayer, MD  promethazine (PHENERGAN) 25 MG tablet Take 1 tablet (25 mg total) by mouth every 6 (six) hours as needed for nausea or vomiting. 05/13/21   Gatha Mayer, MD  SENSIPAR 30 MG tablet Take 30 mg by mouth daily. 09/22/20   [provider]  sevelamer carbonate (RENVELA) 800 MG tablet Take 1 tablet (800 mg total) by mouth 3 (three) times daily with meals. 05/29/20   Daisy Floro, DO    Allergies    Patient has no known allergies.  Review of Systems  Review of Systems  Constitutional:  Positive for fatigue. Negative for chills and fever.  HENT:  Negative for congestion.   Eyes:  Negative for visual disturbance.  Respiratory:  Negative for shortness of breath.   Cardiovascular:  Negative for chest pain.  Gastrointestinal:  Positive for abdominal pain and nausea. Negative for anal bleeding, diarrhea and vomiting.  Genitourinary:  Negative for dysuria.  Skin:  Negative for rash.  Neurological:  Negative for headaches.   Physical Exam Updated Vital Signs BP (!) 167/79 (BP Location: Left Arm)   Pulse 82   Temp 98.2 F (36.8 C) (Oral)   Resp 20   Ht 6' (1.829 m)   Wt 81.2 kg   SpO2 100%   BMI 24.28 kg/m   Physical Exam Vitals and nursing [name] reviewed.  Constitutional:      General: He is not in acute distress.    Appearance: Normal appearance.     Comments: Anxious, tearful, rolling in bed  HENT:     Head: Normocephalic.     Mouth/Throat:     Mouth: Mucous membranes are moist.  Cardiovascular:     Rate and Rhythm: Normal rate.  Pulmonary:     Effort: Pulmonary effort is normal. No respiratory distress.  Abdominal:     General: Bowel sounds are normal. There is no distension.     Palpations: Abdomen is soft.     Tenderness: There is generalized abdominal tenderness. There is no guarding or rebound.  Skin:    General: Skin is warm.  Neurological:     Mental Status: He is alert and oriented to person, place, and  time. Mental status is at baseline.  Psychiatric:        Mood and Affect: Mood normal.    ED Results / Procedures / Treatments   Labs (all labs ordered are listed, but only abnormal results are displayed) Labs Reviewed  CBC WITH DIFFERENTIAL/PLATELET  COMPREHENSIVE METABOLIC PANEL    EKG None  Radiology VAS [place] MESENTERIC  Result Date: 05/25/2021 ABDOMINAL VISCERAL Patient Name:  Edward HYNEK Sr.  Date of Exam:   05/25/2021 Medical Rec #: [id number]                 Accession #:    [id number] Date of Birth: May 04, 1971                 Patient Gender: M Patient Age:   34 years Exam Location:  [facility] Procedure:      VAS [place] MESENTERIC Referring Phys: [id number] Gatha Mayer -------------------------------------------------------------------------------- Indications: pain High Risk Factors: Hypertension, hyperlipidemia, Diabetes. Comparison Study: no prior Performing Technologist: Agricultural engineer  Examination Guidelines: A complete evaluation includes B-mode imaging, spectral Doppler, color Doppler, and power Doppler as needed of all accessible portions of each vessel. Bilateral testing is considered an integral part of a complete examination. Limited examinations for reoccurring indications may be performed as noted.  Duplex Findings: +----------------------+--------+--------+------+--------+ Mesenteric            PSV cm/sEDV cm/sPlaqueComments +----------------------+--------+--------+------+--------+ Aorta at SMA            101                          +----------------------+--------+--------+------+--------+ Celiac Artery Origin    105                          +----------------------+--------+--------+------+--------+ Celiac Artery Proximal  181      47                  +----------------------+--------+--------+------+--------+ SMA Origin              108                          +----------------------+--------+--------+------+--------+ SMA  Proximal            143                          +----------------------+--------+--------+------+--------+ SMA Mid                 155                          +----------------------+--------+--------+------+--------+ SMA Distal              143                          +----------------------+--------+--------+------+--------+ IMA                     198                          +----------------------+--------+--------+------+--------+    Summary: Mesenteric: Normal Celiac artery , Superior Mesenteric artery and Inferior Mesenteric artery findings.  *See table(s) above for measurements and observations.  Diagnosing physician: Deitra Mayo MD  Electronically signed by Deitra Mayo MD on 05/25/2021 at 2:50:52 PM.    Final     Procedures Procedures   Medications Ordered in ED Medications  sodium chloride 0.9 % bolus 500 mL (has no administration in time range)  HYDROmorphone (DILAUDID) injection 1 mg (has no administration in time range)  pantoprazole (PROTONIX) injection 40 mg (has no administration in time range)    ED Course  I have reviewed the triage vital signs and the nursing notes.  Pertinent labs & imaging results that were available during my care of the patient were reviewed by me and considered in my medical decision making (see chart for details).    MDM Rules/Calculators/A&P                           50 year old male presents emergency department for acute on chronic abdominal pain.  He has close follow-up as an outpatient with gastroenterology.  Recently was evaluated for mesenteric ischemia, CTA was negative.  Had an outpatient ultrasound 2 days ago which showed patent vasculature.  He has medication at home but has been unable to tolerate it.  Complaining of diffuse abdominal pain.  Vitals are stable on arrival.  Abdomen is diffusely tender but not peritonitic.  Blood work is reassuring, baseline kidney dysfunction and mild elevated LFTs.   After IV medication patient feels significantly better.  He is sitting up in bed.  Has been able to p.o.  He has scheduled outpatient follow-up with GI and a colonoscopy next week.  With reassuring lab work, resolved symptoms and recent GI imaging I do not feel the need for further emergent work-up.  He offers no new concerns or complaints.  Patient at this time appears safe and stable for discharge and will be treated as an outpatient.  Discharge plan and strict return to ED precautions discussed, patient  verbalizes understanding and agreement.   Final Clinical Impression(s) / ED Diagnoses Final diagnoses:  None    Rx / DC Orders ED Discharge Orders     None        Lorelle Gibbs, DO 05/27/21 1353

## 2021-05-27 NOTE — ED Notes (Signed)
Patient has been sleeping since receiving the pain medication- arouses when spoken to and says that the pain medication has helped

## 2021-05-28 ENCOUNTER — Other Ambulatory Visit: Payer: Self-pay | Admitting: Family Medicine

## 2021-05-28 DIAGNOSIS — Z992 Dependence on renal dialysis: Secondary | ICD-10-CM | POA: Diagnosis not present

## 2021-05-28 DIAGNOSIS — N186 End stage renal disease: Secondary | ICD-10-CM | POA: Diagnosis not present

## 2021-05-28 DIAGNOSIS — N2581 Secondary hyperparathyroidism of renal origin: Secondary | ICD-10-CM | POA: Diagnosis not present

## 2021-05-29 DIAGNOSIS — J9621 Acute and chronic respiratory failure with hypoxia: Secondary | ICD-10-CM | POA: Diagnosis not present

## 2021-05-29 DIAGNOSIS — U071 COVID-19: Secondary | ICD-10-CM | POA: Diagnosis not present

## 2021-05-29 NOTE — Telephone Encounter (Signed)
Requested medications are due for refill today NO  Requested medications are on the active medication list NO  Last refill 01/23/20  Last visit 09/2020  Future visit scheduled no  Notes to clinic This med is not on current med list.

## 2021-05-30 ENCOUNTER — Telehealth: Payer: Self-pay

## 2021-05-30 NOTE — Telephone Encounter (Signed)
Called pt to schedule AWV, no answer, left vm to call back. 

## 2021-05-31 DIAGNOSIS — N186 End stage renal disease: Secondary | ICD-10-CM | POA: Diagnosis not present

## 2021-05-31 DIAGNOSIS — N2581 Secondary hyperparathyroidism of renal origin: Secondary | ICD-10-CM | POA: Diagnosis not present

## 2021-05-31 DIAGNOSIS — Z992 Dependence on renal dialysis: Secondary | ICD-10-CM | POA: Diagnosis not present

## 2021-06-01 ENCOUNTER — Ambulatory Visit (INDEPENDENT_AMBULATORY_CARE_PROVIDER_SITE_OTHER): Payer: Medicare HMO | Admitting: Internal Medicine

## 2021-06-01 ENCOUNTER — Encounter: Payer: Self-pay | Admitting: Internal Medicine

## 2021-06-01 DIAGNOSIS — K529 Noninfective gastroenteritis and colitis, unspecified: Secondary | ICD-10-CM

## 2021-06-01 NOTE — Patient Instructions (Signed)
Contact our office immediately at [phone] if you suffer from any abdominal pain, nausea, or vomiting during capsule endoscopy. a) Do not eat or drink for at least 2 hours. After 2 hours you may have any of the following to drink: Water   White grape juice 7-Up   Chicken Bouillon Sprite   Ginger Ale c) After 4 hours you may have a light snack to include any of the following: A cup of soup   sandwich Bowl of cereal  Rice Toast   Eggs 2-3 small cookies (i.e. vanilla wafers or graham crackers) d) After 8 hours you may return to your regular diet. During your procedure do not go near anyone else that is having capsule endoscopy. Do not be in close contact with an MRI machine or a radio or television tower. Do not wear a heavy coat or sweater because your recorder may over heat and stop recording.   Do not disconnect the equipment or remove the belt at any time.  Since the Data Recorder is actually a small computer, it should be treated with utmost care and protection.  Avoid sudden movement and banging of the Data Recorder.  Do not do any heavy lifting or strenuous physical activity during the test especially if it involves sweating and do not bend over or stoop during capsule endoscopy. During capsule endoscopy, you will need to verify every 15 minutes that the small light on top of the Data Recorder is blinking twice per second.  If for some reason it stops blinking at this site, record the time and contact our office at [phone]. Return at 4:00

## 2021-06-01 NOTE — Progress Notes (Signed)
SN: [id number] Exp: 2022-08-19 LOT: [id number] Patient arrived for Capsule Endoscopy. Reported the prep went well. This nurse explained dietary restrictions for the next few hours. Patient verbalized understanding. Opened capsule, ensured capsule was flashing prior to the patient swallowing the capsule. Patient swallowed capsule without difficulty. Patient instructed to return to the office at 4:00 pm today for removal of the recording equipment, to call the office with any questions and if no capsule was visualized after 72 hours. No further questions by the conclusion of the visit.

## 2021-06-02 DIAGNOSIS — Z992 Dependence on renal dialysis: Secondary | ICD-10-CM | POA: Diagnosis not present

## 2021-06-02 DIAGNOSIS — E1129 Type 2 diabetes mellitus with other diabetic kidney complication: Secondary | ICD-10-CM | POA: Diagnosis not present

## 2021-06-02 DIAGNOSIS — N186 End stage renal disease: Secondary | ICD-10-CM | POA: Diagnosis not present

## 2021-06-02 DIAGNOSIS — N2581 Secondary hyperparathyroidism of renal origin: Secondary | ICD-10-CM | POA: Diagnosis not present

## 2021-06-04 DIAGNOSIS — N2581 Secondary hyperparathyroidism of renal origin: Secondary | ICD-10-CM | POA: Diagnosis not present

## 2021-06-04 DIAGNOSIS — Z992 Dependence on renal dialysis: Secondary | ICD-10-CM | POA: Diagnosis not present

## 2021-06-04 DIAGNOSIS — N186 End stage renal disease: Secondary | ICD-10-CM | POA: Diagnosis not present

## 2021-06-07 DIAGNOSIS — N2581 Secondary hyperparathyroidism of renal origin: Secondary | ICD-10-CM | POA: Diagnosis not present

## 2021-06-07 DIAGNOSIS — Z992 Dependence on renal dialysis: Secondary | ICD-10-CM | POA: Diagnosis not present

## 2021-06-07 DIAGNOSIS — N186 End stage renal disease: Secondary | ICD-10-CM | POA: Diagnosis not present

## 2021-06-08 ENCOUNTER — Telehealth: Payer: Self-pay | Admitting: Internal Medicine

## 2021-06-08 DIAGNOSIS — T189XXA Foreign body of alimentary tract, part unspecified, initial encounter: Secondary | ICD-10-CM

## 2021-06-08 NOTE — Telephone Encounter (Signed)
Contacted patient and explained that capsule endoscope remained in the stomach during the study.  Asked patient has he passed the capsule?  If he has not passed the capsule have him do a KUB to see if it is retained  I still expect him to do the stool test that are ordered, a calprotectin and a qualitative fecal fat they are not yet done

## 2021-06-09 DIAGNOSIS — N186 End stage renal disease: Secondary | ICD-10-CM | POA: Diagnosis not present

## 2021-06-09 DIAGNOSIS — N2581 Secondary hyperparathyroidism of renal origin: Secondary | ICD-10-CM | POA: Diagnosis not present

## 2021-06-09 DIAGNOSIS — Z992 Dependence on renal dialysis: Secondary | ICD-10-CM | POA: Diagnosis not present

## 2021-06-10 ENCOUNTER — Other Ambulatory Visit: Payer: Self-pay

## 2021-06-10 ENCOUNTER — Ambulatory Visit (HOSPITAL_COMMUNITY): Payer: Medicare HMO | Attending: Cardiology

## 2021-06-10 DIAGNOSIS — I3139 Other pericardial effusion (noninflammatory): Secondary | ICD-10-CM

## 2021-06-10 DIAGNOSIS — I313 Pericardial effusion (noninflammatory): Secondary | ICD-10-CM

## 2021-06-10 NOTE — Telephone Encounter (Signed)
Patient notified  He is not certain he passed the capsule.  He will come for stool studies and the KUB at his next convenience.

## 2021-06-11 DIAGNOSIS — N186 End stage renal disease: Secondary | ICD-10-CM | POA: Diagnosis not present

## 2021-06-11 DIAGNOSIS — N2581 Secondary hyperparathyroidism of renal origin: Secondary | ICD-10-CM | POA: Diagnosis not present

## 2021-06-11 DIAGNOSIS — Z992 Dependence on renal dialysis: Secondary | ICD-10-CM | POA: Diagnosis not present

## 2021-06-13 LAB — ECHOCARDIOGRAM COMPLETE
Area-P 1/2: 4.89 cm2
S' Lateral: 3.1 cm

## 2021-06-13 NOTE — Telephone Encounter (Signed)
Tried Again, no answer, LVMTRC.

## 2021-06-14 DIAGNOSIS — N2581 Secondary hyperparathyroidism of renal origin: Secondary | ICD-10-CM | POA: Diagnosis not present

## 2021-06-14 DIAGNOSIS — N186 End stage renal disease: Secondary | ICD-10-CM | POA: Diagnosis not present

## 2021-06-14 DIAGNOSIS — Z992 Dependence on renal dialysis: Secondary | ICD-10-CM | POA: Diagnosis not present

## 2021-06-18 DIAGNOSIS — N2581 Secondary hyperparathyroidism of renal origin: Secondary | ICD-10-CM | POA: Diagnosis not present

## 2021-06-18 DIAGNOSIS — N186 End stage renal disease: Secondary | ICD-10-CM | POA: Diagnosis not present

## 2021-06-18 DIAGNOSIS — Z992 Dependence on renal dialysis: Secondary | ICD-10-CM | POA: Diagnosis not present

## 2021-06-21 DIAGNOSIS — N2581 Secondary hyperparathyroidism of renal origin: Secondary | ICD-10-CM | POA: Diagnosis not present

## 2021-06-21 DIAGNOSIS — Z992 Dependence on renal dialysis: Secondary | ICD-10-CM | POA: Diagnosis not present

## 2021-06-21 DIAGNOSIS — N186 End stage renal disease: Secondary | ICD-10-CM | POA: Diagnosis not present

## 2021-06-23 DIAGNOSIS — Z992 Dependence on renal dialysis: Secondary | ICD-10-CM | POA: Diagnosis not present

## 2021-06-23 DIAGNOSIS — N2581 Secondary hyperparathyroidism of renal origin: Secondary | ICD-10-CM | POA: Diagnosis not present

## 2021-06-23 DIAGNOSIS — N186 End stage renal disease: Secondary | ICD-10-CM | POA: Diagnosis not present

## 2021-06-25 DIAGNOSIS — Z992 Dependence on renal dialysis: Secondary | ICD-10-CM | POA: Diagnosis not present

## 2021-06-25 DIAGNOSIS — N2581 Secondary hyperparathyroidism of renal origin: Secondary | ICD-10-CM | POA: Diagnosis not present

## 2021-06-25 DIAGNOSIS — N186 End stage renal disease: Secondary | ICD-10-CM | POA: Diagnosis not present

## 2021-06-28 DIAGNOSIS — N2581 Secondary hyperparathyroidism of renal origin: Secondary | ICD-10-CM | POA: Diagnosis not present

## 2021-06-28 DIAGNOSIS — N186 End stage renal disease: Secondary | ICD-10-CM | POA: Diagnosis not present

## 2021-06-28 DIAGNOSIS — Z992 Dependence on renal dialysis: Secondary | ICD-10-CM | POA: Diagnosis not present

## 2021-06-29 ENCOUNTER — Ambulatory Visit (INDEPENDENT_AMBULATORY_CARE_PROVIDER_SITE_OTHER): Payer: Medicare HMO | Admitting: Podiatry

## 2021-06-29 ENCOUNTER — Ambulatory Visit (INDEPENDENT_AMBULATORY_CARE_PROVIDER_SITE_OTHER): Payer: Medicare HMO | Admitting: Internal Medicine

## 2021-06-29 ENCOUNTER — Other Ambulatory Visit: Payer: Self-pay

## 2021-06-29 ENCOUNTER — Ambulatory Visit (INDEPENDENT_AMBULATORY_CARE_PROVIDER_SITE_OTHER)
Admission: RE | Admit: 2021-06-29 | Discharge: 2021-06-29 | Disposition: A | Discharge status: Home / Self Care | Payer: Medicare HMO | Source: Ambulatory Visit | Attending: Internal Medicine | Admitting: Internal Medicine

## 2021-06-29 ENCOUNTER — Encounter: Payer: Self-pay | Admitting: Internal Medicine

## 2021-06-29 VITALS — BP 100/50 | HR 76 | Ht 70.0 in | Wt 186.1 lb

## 2021-06-29 DIAGNOSIS — B351 Tinea unguium: Secondary | ICD-10-CM

## 2021-06-29 DIAGNOSIS — T189XXA Foreign body of alimentary tract, part unspecified, initial encounter: Secondary | ICD-10-CM | POA: Diagnosis not present

## 2021-06-29 DIAGNOSIS — R1033 Periumbilical pain: Secondary | ICD-10-CM | POA: Diagnosis not present

## 2021-06-29 DIAGNOSIS — M79674 Pain in right toe(s): Secondary | ICD-10-CM | POA: Diagnosis not present

## 2021-06-29 DIAGNOSIS — R112 Nausea with vomiting, unspecified: Secondary | ICD-10-CM

## 2021-06-29 DIAGNOSIS — U071 COVID-19: Secondary | ICD-10-CM | POA: Diagnosis not present

## 2021-06-29 DIAGNOSIS — K529 Noninfective gastroenteritis and colitis, unspecified: Secondary | ICD-10-CM | POA: Diagnosis not present

## 2021-06-29 DIAGNOSIS — N186 End stage renal disease: Secondary | ICD-10-CM | POA: Diagnosis not present

## 2021-06-29 DIAGNOSIS — E1142 Type 2 diabetes mellitus with diabetic polyneuropathy: Secondary | ICD-10-CM | POA: Diagnosis not present

## 2021-06-29 DIAGNOSIS — Z992 Dependence on renal dialysis: Secondary | ICD-10-CM

## 2021-06-29 DIAGNOSIS — M79675 Pain in left toe(s): Secondary | ICD-10-CM | POA: Diagnosis not present

## 2021-06-29 DIAGNOSIS — K30 Functional dyspepsia: Secondary | ICD-10-CM

## 2021-06-29 DIAGNOSIS — J9621 Acute and chronic respiratory failure with hypoxia: Secondary | ICD-10-CM | POA: Diagnosis not present

## 2021-06-29 NOTE — Patient Instructions (Signed)
Today go to x-ray before leaving. It is in the basement.  Do your stool test please.   You have been scheduled for a gastric emptying scan at [facility] on 07/08/21 at 9:00am. Please arrive at least 15 minutes prior to your appointment for registration. Please make certain not to have anything to eat or drink after midnight the night before your test. Hold all stomach medications (ex: Zofran, phenergan, Reglan) 48 hours prior to your test. If you need to reschedule your appointment, please contact radiology scheduling at [phone]. _____________________________________________________________________ A gastric-emptying study measures how long it takes for food to move through your stomach. There are several ways to measure stomach emptying. In the most common test, you eat food that contains a small amount of radioactive material. A scanner that detects the movement of the radioactive material is placed over your abdomen to monitor the rate at which food leaves your stomach. This test normally takes about 4 hours to complete. _____________________________________________________________________  I appreciate the opportunity to care for you. Silvano Rusk, MD, [facility]

## 2021-06-29 NOTE — Progress Notes (Signed)
This patient returns to my office for at risk foot care.  This patient requires this care by a professional since this patient will be at risk due to having ESRD, Blind,   and DM with polyneuropathy.  This patient is unable to cut nails himself since the patient cannot reach his nails.These nails are painful walking and wearing shoes.  Patient has not been seen in over 10 months. This patient presents for at risk foot care today.  General Appearance  Alert, conversant and in no acute stress.  Vascular  Dorsalis pedis and posterior tibial  pulses are  weakly palpable  bilaterally.  Capillary return is within normal limits  bilaterally. Cold feet. bilaterally.  Neurologic  Senn-Weinstein monofilament wire test diminished  bilaterally. Muscle power within normal limits bilaterally.  Nails Thick disfigured discolored nails with subungual debris  from hallux to fifth toes bilaterally. No evidence of bacterial infection or drainage bilaterally. HAV  B/L.  Orthopedic  No limitations of motion  feet .  No crepitus or effusions noted.  No bony pathology or digital deformities noted.  HAV  B/L.    Skin  normotropic skin with no porokeratosis noted bilaterally.  No signs of infections or ulcers noted.     Onychomycosis  Pain in right toes  Pain in left toes  Consent was obtained for treatment procedures.   Mechanical debridement of nails 1-5  bilaterally performed with a nail nipper.  Filed with dremel without incident. Patient qualifies for diabetic shoes due to DPN and HAV  B/L.   Return office visit  4 months                    Told patient to return for periodic foot care and evaluation due to potential at risk complications.   Gardiner Barefoot DPM

## 2021-06-29 NOTE — Progress Notes (Signed)
Edward FALCO Sr. 50 y.o. Mar 01, 1971 [id number]  Assessment & Plan:   Encounter Diagnoses  Name Primary?   Periumbilical abdominal pain Yes   Chronic diarrhea    Delayed gastric emptying-suspected    Non-intractable vomiting with nausea, unspecified vomiting type    Nausea and vomiting, intractability of vomiting not specified, unspecified vomiting type    My working diagnosis remains diabetic neuropathic pain.   He will complete the stool tests we will do a gastric emptying study and I will check a KUB for completeness to make sure he has passed the capsule.  He is not inclined to pursue a different pain clinic.    Subjective:   Chief Complaint: Abdominal pain  HPI Edward [name] is here for follow-up with chronic abdominal pain.  He is not eating or "not really eating" due to abdominal pain.  A lot of periumbilical pain still.  Intermittent diarrhea and nausea and vomiting.  Work-up to date has been extensive without abnormality to explain things.  Colonoscopy in 2021 with normal random biopsies.  An EGD showed some nonspecific "peptic duodenitis" changes in 2022.  CT scanning both typical abdomen pelvis without contrast and an angio abdomen pelvis with or without contrast on remarkable or unrevealing with respect to his pain.  I had him do a mesenteric Doppler study and that was negative.  Capsule endoscopy was attempted but the capsule spent 8 hours in his stomach.   Marijuana has not helped him regular narcotics have not helped.  It sounds like he has tried gabapentin in the past.  He went to a pain clinic but they were doing some type of procedure where they "stuck needles in my arm" and he did not go back.  He was to do a KUB as well as a stool calprotectin and a stool fecal fat which she has not yet done.  The KUB was to document passage of the capsule endoscope.  He thinks he did pass it. Wt Readings from Last 3 Encounters:  06/29/21 186 lb 2 oz (84.4 kg)  05/27/21 179  lb (81.2 kg)  05/13/21 185 lb 6 oz (84.1 kg)    No Known Allergies Current Meds  Medication Sig   Accu-Chek Softclix Lancets lancets Use as instructed   acetaminophen (TYLENOL) 500 MG tablet Take 500 mg by mouth every 6 (six) hours as needed.   Alcohol Swabs (B-D SINGLE USE SWABS REGULAR) PADS Use as directed.   atorvastatin (LIPITOR) 40 MG tablet Take 1 tablet (40 mg total) by mouth daily.   Blood Glucose Monitoring Suppl (ACCU-CHEK GUIDE ME) w/Device KIT Use 3 times daily before meals   cinacalcet (SENSIPAR) 90 MG tablet Take 90 mg by mouth daily.   diclofenac Sodium (VOLTAREN) 1 % GEL Apply 4 g topically 4 (four) times daily.   Doxercalciferol (HECTOROL IV) Dialysis   Emollient (EUCERIN ADVANCED REPAIR) CREA Apply to dry skin as needed   furosemide (LASIX) 80 MG tablet Take 80 mg by mouth 2 (two) times daily.   glipiZIDE (GLUCOTROL XL) 2.5 MG 24 hr tablet Take by mouth daily.   glucose blood (ACCU-CHEK GUIDE) test strip Use as instructed   heparin 10000 unit/mL SOLN Dialysis   ketoconazole (NIZORAL) 2 % cream Apply 1 application topically daily.   levothyroxine (SYNTHROID) 137 MCG tablet TAKE 1 TABLET(137 MCG) BY MOUTH DAILY BEFORE BREAKFAST   Methoxy PEG-Epoetin Beta (MIRCERA IJ) Dialysis   oxyCODONE (OXY IR/ROXICODONE) 5 MG immediate release tablet Take 1 tablet (5 mg total) by  mouth every 4 (four) hours as needed for severe pain.   promethazine (PHENERGAN) 25 MG tablet Take 1 tablet (25 mg total) by mouth every 6 (six) hours as needed for nausea or vomiting.   SENSIPAR 30 MG tablet Take 30 mg by mouth daily.   sevelamer carbonate (RENVELA) 800 MG tablet Take 1 tablet (800 mg total) by mouth 3 (three) times daily with meals.   sildenafil (VIAGRA) 50 MG tablet TAKE 2 TABLETS BY MOUTH DAILY AS NEEDED FOR ERECTILE DYSFUNCTION. AT LEAST 24 HOURS BETWEEN DOSES   Past Medical History:  Diagnosis Date   Acute on chronic renal failure ([place]) 11/25/2015   Anemia    Arthritis    Blind     Left eye   Cataract    CHF (congestive heart failure) ([facility])    Chronic kidney disease    stage 5   COVID-19    Depression    situatuional   Diabetes mellitus    Type II   GERD (gastroesophageal reflux disease)    Graves disease 2014   Headache    in past   Hx of adenomatous and sessile serrated colonic polyps 11/21/2019   Hyperlipidemia    Hypertension    Hypothyroidism    Legally blind    right eye   Neuropathy    Non-compliance    Proteinuria 05/24/2020   Shortness of breath dyspnea    "Better since I've been taking my medications"   Past Surgical History:  Procedure Laterality Date   AV FISTULA PLACEMENT Right 04/22/2019   Procedure: RIGHT ARM ARTERIOVENOUS (AV) FISTULA CREATION;  Surgeon: Angelia Mould, MD;  Location: [place];  Service: Vascular;  Laterality: Right;   CIRCUMCISION     as a child, around 76 years of age   COLONOSCOPY     EYE SURGERY Bilateral    "several "   PARS PLANA VITRECTOMY Right 03/25/2016   Procedure: PARS PLANA VITRECTOMY 25 GAUGE FOR ENDOPHTHALMITIS;  Surgeon: Jalene Mullet, MD;  Location: [place];  Service: Ophthalmology;  Laterality: Right;   WISDOM TOOTH EXTRACTION     Social History   Social History Narrative   Divorced   94 children   70 yo son with him at home   Disabled chef - cafeterias, Chiropodist, [employer], etc      Former cigarette smoker, marijuana now, rare alcohol, other drugs   family history includes Bladder Cancer in his brother; Diabetes in his mother; Hypertension in his mother; Kidney disease in an other family member.   Review of Systems  As above Objective:   Physical Exam BP (!) 100/50 (BP Location: Left Arm, Patient Position: Sitting, Cuff Size: Normal)   Pulse 76   Ht _0  (1.778 m)   Wt 186 lb 2 oz (84.4 kg)   BMI 26.71 kg/m  Abd benign, no splash

## 2021-06-30 DIAGNOSIS — N186 End stage renal disease: Secondary | ICD-10-CM | POA: Diagnosis not present

## 2021-06-30 DIAGNOSIS — N2581 Secondary hyperparathyroidism of renal origin: Secondary | ICD-10-CM | POA: Diagnosis not present

## 2021-06-30 DIAGNOSIS — Z992 Dependence on renal dialysis: Secondary | ICD-10-CM | POA: Diagnosis not present

## 2021-07-02 DIAGNOSIS — E1129 Type 2 diabetes mellitus with other diabetic kidney complication: Secondary | ICD-10-CM | POA: Diagnosis not present

## 2021-07-02 DIAGNOSIS — N186 End stage renal disease: Secondary | ICD-10-CM | POA: Diagnosis not present

## 2021-07-02 DIAGNOSIS — Z992 Dependence on renal dialysis: Secondary | ICD-10-CM | POA: Diagnosis not present

## 2021-07-02 DIAGNOSIS — N2581 Secondary hyperparathyroidism of renal origin: Secondary | ICD-10-CM | POA: Diagnosis not present

## 2021-07-05 DIAGNOSIS — N186 End stage renal disease: Secondary | ICD-10-CM | POA: Diagnosis not present

## 2021-07-05 DIAGNOSIS — N2581 Secondary hyperparathyroidism of renal origin: Secondary | ICD-10-CM | POA: Diagnosis not present

## 2021-07-05 DIAGNOSIS — Z992 Dependence on renal dialysis: Secondary | ICD-10-CM | POA: Diagnosis not present

## 2021-07-07 DIAGNOSIS — N186 End stage renal disease: Secondary | ICD-10-CM | POA: Diagnosis not present

## 2021-07-07 DIAGNOSIS — Z992 Dependence on renal dialysis: Secondary | ICD-10-CM | POA: Diagnosis not present

## 2021-07-07 DIAGNOSIS — Z01818 Encounter for other preprocedural examination: Secondary | ICD-10-CM | POA: Diagnosis not present

## 2021-07-07 DIAGNOSIS — N2581 Secondary hyperparathyroidism of renal origin: Secondary | ICD-10-CM | POA: Diagnosis not present

## 2021-07-08 ENCOUNTER — Encounter (HOSPITAL_COMMUNITY): Payer: Medicare HMO | Attending: Internal Medicine

## 2021-07-09 DIAGNOSIS — N186 End stage renal disease: Secondary | ICD-10-CM | POA: Diagnosis not present

## 2021-07-09 DIAGNOSIS — Z992 Dependence on renal dialysis: Secondary | ICD-10-CM | POA: Diagnosis not present

## 2021-07-09 DIAGNOSIS — N2581 Secondary hyperparathyroidism of renal origin: Secondary | ICD-10-CM | POA: Diagnosis not present

## 2021-07-12 DIAGNOSIS — N2581 Secondary hyperparathyroidism of renal origin: Secondary | ICD-10-CM | POA: Diagnosis not present

## 2021-07-12 DIAGNOSIS — E873 Alkalosis: Secondary | ICD-10-CM | POA: Diagnosis not present

## 2021-07-12 DIAGNOSIS — Z01818 Encounter for other preprocedural examination: Secondary | ICD-10-CM | POA: Diagnosis not present

## 2021-07-12 DIAGNOSIS — N186 End stage renal disease: Secondary | ICD-10-CM | POA: Diagnosis not present

## 2021-07-12 DIAGNOSIS — Z992 Dependence on renal dialysis: Secondary | ICD-10-CM | POA: Diagnosis not present

## 2021-07-12 DIAGNOSIS — I120 Hypertensive chronic kidney disease with stage 5 chronic kidney disease or end stage renal disease: Secondary | ICD-10-CM | POA: Diagnosis not present

## 2021-07-14 DIAGNOSIS — N186 End stage renal disease: Secondary | ICD-10-CM | POA: Diagnosis not present

## 2021-07-14 DIAGNOSIS — N2581 Secondary hyperparathyroidism of renal origin: Secondary | ICD-10-CM | POA: Diagnosis not present

## 2021-07-14 DIAGNOSIS — I493 Ventricular premature depolarization: Secondary | ICD-10-CM | POA: Diagnosis not present

## 2021-07-14 DIAGNOSIS — Z992 Dependence on renal dialysis: Secondary | ICD-10-CM | POA: Diagnosis not present

## 2021-07-16 ENCOUNTER — Encounter (HOSPITAL_COMMUNITY): Payer: Self-pay | Admitting: *Deleted

## 2021-07-16 ENCOUNTER — Other Ambulatory Visit: Payer: Self-pay

## 2021-07-16 ENCOUNTER — Emergency Department (HOSPITAL_COMMUNITY)
Admission: EM | Admit: 2021-07-16 | Discharge: 2021-07-17 | Disposition: A | Discharge status: Home / Self Care | Payer: Medicare HMO | Attending: Emergency Medicine | Admitting: Emergency Medicine

## 2021-07-16 DIAGNOSIS — R109 Unspecified abdominal pain: Secondary | ICD-10-CM | POA: Insufficient documentation

## 2021-07-16 DIAGNOSIS — N2581 Secondary hyperparathyroidism of renal origin: Secondary | ICD-10-CM | POA: Diagnosis not present

## 2021-07-16 DIAGNOSIS — I10 Essential (primary) hypertension: Secondary | ICD-10-CM | POA: Diagnosis not present

## 2021-07-16 DIAGNOSIS — R1084 Generalized abdominal pain: Secondary | ICD-10-CM | POA: Diagnosis not present

## 2021-07-16 DIAGNOSIS — N186 End stage renal disease: Secondary | ICD-10-CM | POA: Diagnosis not present

## 2021-07-16 DIAGNOSIS — Z992 Dependence on renal dialysis: Secondary | ICD-10-CM | POA: Diagnosis not present

## 2021-07-16 DIAGNOSIS — Z5321 Procedure and treatment not carried out due to patient leaving prior to being seen by health care provider: Secondary | ICD-10-CM | POA: Insufficient documentation

## 2021-07-16 LAB — CBC WITH DIFFERENTIAL/PLATELET
Abs Immature Granulocytes: 0.02 10*3/uL (ref 0.00–0.07)
Basophils Absolute: 0 10*3/uL (ref 0.0–0.1)
Basophils Relative: 0 %
Eosinophils Absolute: 0 10*3/uL (ref 0.0–0.5)
Eosinophils Relative: 0 %
HCT: 35.8 % — ABNORMAL LOW (ref 39.0–52.0)
Hemoglobin: 12.7 g/dL — ABNORMAL LOW (ref 13.0–17.0)
Immature Granulocytes: 0 %
Lymphocytes Relative: 12 %
Lymphs Abs: 0.8 10*3/uL (ref 0.7–4.0)
MCH: 36.1 pg — ABNORMAL HIGH (ref 26.0–34.0)
MCHC: 35.5 g/dL (ref 30.0–36.0)
MCV: 101.7 fL — ABNORMAL HIGH (ref 80.0–100.0)
Monocytes Absolute: 0.1 10*3/uL (ref 0.1–1.0)
Monocytes Relative: 2 %
Neutro Abs: 5.6 10*3/uL (ref 1.7–7.7)
Neutrophils Relative %: 86 %
Platelets: 200 10*3/uL (ref 150–400)
RBC: 3.52 MIL/uL — ABNORMAL LOW (ref 4.22–5.81)
RDW: 13.9 % (ref 11.5–15.5)
WBC: 6.6 10*3/uL (ref 4.0–10.5)
nRBC: 0 % (ref 0.0–0.2)

## 2021-07-16 LAB — COMPREHENSIVE METABOLIC PANEL
ALT: 36 U/L (ref 0–44)
AST: 32 U/L (ref 15–41)
Albumin: 4.9 g/dL (ref 3.5–5.0)
Alkaline Phosphatase: 83 U/L (ref 38–126)
Anion gap: 15 (ref 5–15)
BUN: 13 mg/dL (ref 6–20)
CO2: 30 mmol/L (ref 22–32)
Calcium: 10.2 mg/dL (ref 8.9–10.3)
Chloride: 94 mmol/L — ABNORMAL LOW (ref 98–111)
Creatinine, Ser: 6.95 mg/dL — ABNORMAL HIGH (ref 0.61–1.24)
GFR, Estimated: 9 mL/min — ABNORMAL LOW (ref >60)
Glucose, Bld: 123 mg/dL — ABNORMAL HIGH (ref 70–99)
Potassium: 3.6 mmol/L (ref 3.5–5.1)
Sodium: 139 mmol/L (ref 135–145)
Total Bilirubin: 0.8 mg/dL (ref 0.3–1.2)
Total Protein: 9.1 g/dL — ABNORMAL HIGH (ref 6.5–8.1)

## 2021-07-16 LAB — LIPASE, BLOOD: Lipase: 67 U/L — ABNORMAL HIGH (ref 11–51)

## 2021-07-16 NOTE — ED Provider Notes (Signed)
Emergency Medicine Provider Triage Evaluation Note  Edward [name] , a 50 y.o. male  was evaluated in triage.  Pt complains of abd pain.  Review of Systems  Positive: Abd pain, n/v/d/c Negative: Fever, cough  Physical Exam  There were no vitals taken for this visit. Gen:   Awake, appears uncomfortable, moanin Resp:  Normal effort  MSK:   Moves extremities without difficulty  Other:  TTP abd diffusely  Medical Decision Making  Medically screening exam initiated at 8:41 PM.  Appropriate orders placed.  Edward [name] was informed that the remainder of the evaluation will be completed by another provider, this initial triage assessment does not replace that evaluation, and the importance of remaining in the ED until their evaluation is complete.  Report having diffused abd pain since yesterday.  Is a dialysis pt and had his full session today.  Poor historian.   [name], PA-C 07/16/21 2044    Hayden Rasmussen, MD 07/16/21 [phone]

## 2021-07-16 NOTE — ED Notes (Signed)
Pt left emergency department

## 2021-07-16 NOTE — ED Triage Notes (Signed)
[place] [facility] [place]  the pt is dialysis and was dialyzed today.  Shunt rt arm  not co-operating he keeps both his eyes closed  c/o abd pain

## 2021-07-18 ENCOUNTER — Other Ambulatory Visit: Payer: Self-pay

## 2021-07-18 ENCOUNTER — Emergency Department (HOSPITAL_COMMUNITY)
Admission: EM | Admit: 2021-07-18 | Discharge: 2021-07-18 | Disposition: A | Discharge status: Home / Self Care | Payer: Medicare HMO | Attending: Emergency Medicine | Admitting: Emergency Medicine

## 2021-07-18 ENCOUNTER — Encounter (HOSPITAL_COMMUNITY): Payer: Self-pay | Admitting: Emergency Medicine

## 2021-07-18 DIAGNOSIS — R112 Nausea with vomiting, unspecified: Secondary | ICD-10-CM | POA: Insufficient documentation

## 2021-07-18 DIAGNOSIS — Z79899 Other long term (current) drug therapy: Secondary | ICD-10-CM | POA: Diagnosis not present

## 2021-07-18 DIAGNOSIS — Z8616 Personal history of COVID-19: Secondary | ICD-10-CM | POA: Insufficient documentation

## 2021-07-18 DIAGNOSIS — E039 Hypothyroidism, unspecified: Secondary | ICD-10-CM | POA: Diagnosis not present

## 2021-07-18 DIAGNOSIS — N186 End stage renal disease: Secondary | ICD-10-CM | POA: Insufficient documentation

## 2021-07-18 DIAGNOSIS — R109 Unspecified abdominal pain: Secondary | ICD-10-CM

## 2021-07-18 DIAGNOSIS — E1122 Type 2 diabetes mellitus with diabetic chronic kidney disease: Secondary | ICD-10-CM | POA: Insufficient documentation

## 2021-07-18 DIAGNOSIS — Z87891 Personal history of nicotine dependence: Secondary | ICD-10-CM | POA: Diagnosis not present

## 2021-07-18 DIAGNOSIS — R110 Nausea: Secondary | ICD-10-CM | POA: Diagnosis not present

## 2021-07-18 DIAGNOSIS — Z7984 Long term (current) use of oral hypoglycemic drugs: Secondary | ICD-10-CM | POA: Diagnosis not present

## 2021-07-18 DIAGNOSIS — R1084 Generalized abdominal pain: Secondary | ICD-10-CM | POA: Insufficient documentation

## 2021-07-18 DIAGNOSIS — I132 Hypertensive heart and chronic kidney disease with heart failure and with stage 5 chronic kidney disease, or end stage renal disease: Secondary | ICD-10-CM | POA: Insufficient documentation

## 2021-07-18 DIAGNOSIS — Z992 Dependence on renal dialysis: Secondary | ICD-10-CM | POA: Insufficient documentation

## 2021-07-18 DIAGNOSIS — I509 Heart failure, unspecified: Secondary | ICD-10-CM | POA: Insufficient documentation

## 2021-07-18 DIAGNOSIS — I10 Essential (primary) hypertension: Secondary | ICD-10-CM | POA: Diagnosis not present

## 2021-07-18 LAB — CBC WITH DIFFERENTIAL/PLATELET
Abs Immature Granulocytes: 0.05 10*3/uL (ref 0.00–0.07)
Basophils Absolute: 0 10*3/uL (ref 0.0–0.1)
Basophils Relative: 0 %
Eosinophils Absolute: 0 10*3/uL (ref 0.0–0.5)
Eosinophils Relative: 0 %
HCT: 39.1 % (ref 39.0–52.0)
Hemoglobin: 13.8 g/dL (ref 13.0–17.0)
Immature Granulocytes: 0 %
Lymphocytes Relative: 14 %
Lymphs Abs: 1.6 10*3/uL (ref 0.7–4.0)
MCH: 35.4 pg — ABNORMAL HIGH (ref 26.0–34.0)
MCHC: 35.3 g/dL (ref 30.0–36.0)
MCV: 100.3 fL — ABNORMAL HIGH (ref 80.0–100.0)
Monocytes Absolute: 0.5 10*3/uL (ref 0.1–1.0)
Monocytes Relative: 4 %
Neutro Abs: 9.9 10*3/uL — ABNORMAL HIGH (ref 1.7–7.7)
Neutrophils Relative %: 82 %
Platelets: 215 10*3/uL (ref 150–400)
RBC: 3.9 MIL/uL — ABNORMAL LOW (ref 4.22–5.81)
RDW: 13.9 % (ref 11.5–15.5)
WBC: 12.1 10*3/uL — ABNORMAL HIGH (ref 4.0–10.5)
nRBC: 0 % (ref 0.0–0.2)

## 2021-07-18 LAB — BASIC METABOLIC PANEL
Anion gap: 13 (ref 5–15)
BUN: 33 mg/dL — ABNORMAL HIGH (ref 6–20)
CO2: 35 mmol/L — ABNORMAL HIGH (ref 22–32)
Calcium: 11.1 mg/dL — ABNORMAL HIGH (ref 8.9–10.3)
Chloride: 87 mmol/L — ABNORMAL LOW (ref 98–111)
Creatinine, Ser: 10.27 mg/dL — ABNORMAL HIGH (ref 0.61–1.24)
GFR, Estimated: 6 mL/min — ABNORMAL LOW (ref >60)
Glucose, Bld: 98 mg/dL (ref 70–99)
Potassium: 4.1 mmol/L (ref 3.5–5.1)
Sodium: 135 mmol/L (ref 135–145)

## 2021-07-18 MED ORDER — SUCRALFATE 1 G PO TABS: 1.0000 g | ORAL_TABLET | Freq: Once | ORAL | Status: DC

## 2021-07-18 MED ORDER — HALOPERIDOL LACTATE 5 MG/ML IJ SOLN
5.0000 mg | Freq: Once | INTRAMUSCULAR | Status: AC
  Administered 2021-07-18: 5 mg via INTRAVENOUS
  Filled 2021-07-18: qty 1

## 2021-07-18 MED ORDER — OXYCODONE-ACETAMINOPHEN 5-325 MG PO TABS
1.0000 | ORAL_TABLET | Freq: Once | ORAL | Status: AC
Start: 2021-07-18 — End: 2021-07-18
  Administered 2021-07-18: 1 via ORAL
  Filled 2021-07-18: qty 1

## 2021-07-18 MED ORDER — ONDANSETRON 4 MG PO TBDP
4.0000 mg | ORAL_TABLET | Freq: Once | ORAL | Status: AC
  Administered 2021-07-18: 4 mg via ORAL
  Filled 2021-07-18: qty 1

## 2021-07-18 MED ORDER — DICYCLOMINE HCL 20 MG PO TABS: 20.0000 mg | ORAL_TABLET | Freq: Two times a day (BID) | ORAL | 0 refills | Status: DC

## 2021-07-18 MED ORDER — FENTANYL CITRATE PF 50 MCG/ML IJ SOSY
50.0000 ug | PREFILLED_SYRINGE | Freq: Once | INTRAMUSCULAR | Status: AC
  Administered 2021-07-18: 50 ug via INTRAVENOUS
  Filled 2021-07-18: qty 1

## 2021-07-18 MED ORDER — DICYCLOMINE HCL 10 MG PO CAPS
10.0000 mg | ORAL_CAPSULE | Freq: Once | ORAL | Status: AC
  Administered 2021-07-18: 10 mg via ORAL
  Filled 2021-07-18: qty 1

## 2021-07-18 NOTE — Discharge Instructions (Addendum)
We saw in the ER for abdominal pain, that appears to be chronic and recurrent. Unfortunately, we do not know the cause of hip pain.  Fortunately, we were able to get it in control right now.  We recommend clear liquid diet for the next 2 or 3 days.

## 2021-07-18 NOTE — ED Triage Notes (Addendum)
Pt arrives POV w/ C/o lower abd pain. Pt also states he has hiccups and they wont go away. Pt states he has had multiple scans and tests done for his abd pain and they have not found anything. Pt reports n/v as well.

## 2021-07-19 ENCOUNTER — Encounter (HOSPITAL_COMMUNITY): Payer: Self-pay

## 2021-07-19 ENCOUNTER — Emergency Department (HOSPITAL_COMMUNITY)
Admission: EM | Admit: 2021-07-19 | Discharge: 2021-07-19 | Disposition: A | Discharge status: Home / Self Care | Payer: Medicare HMO | Attending: Emergency Medicine | Admitting: Emergency Medicine

## 2021-07-19 ENCOUNTER — Emergency Department (HOSPITAL_COMMUNITY): Payer: Medicare HMO

## 2021-07-19 ENCOUNTER — Other Ambulatory Visit: Payer: Self-pay

## 2021-07-19 DIAGNOSIS — Z992 Dependence on renal dialysis: Secondary | ICD-10-CM | POA: Insufficient documentation

## 2021-07-19 DIAGNOSIS — E039 Hypothyroidism, unspecified: Secondary | ICD-10-CM | POA: Insufficient documentation

## 2021-07-19 DIAGNOSIS — Z7984 Long term (current) use of oral hypoglycemic drugs: Secondary | ICD-10-CM | POA: Diagnosis not present

## 2021-07-19 DIAGNOSIS — I5032 Chronic diastolic (congestive) heart failure: Secondary | ICD-10-CM | POA: Insufficient documentation

## 2021-07-19 DIAGNOSIS — E1122 Type 2 diabetes mellitus with diabetic chronic kidney disease: Secondary | ICD-10-CM | POA: Diagnosis not present

## 2021-07-19 DIAGNOSIS — R112 Nausea with vomiting, unspecified: Secondary | ICD-10-CM | POA: Insufficient documentation

## 2021-07-19 DIAGNOSIS — I132 Hypertensive heart and chronic kidney disease with heart failure and with stage 5 chronic kidney disease, or end stage renal disease: Secondary | ICD-10-CM | POA: Insufficient documentation

## 2021-07-19 DIAGNOSIS — I700 Atherosclerosis of aorta: Secondary | ICD-10-CM | POA: Diagnosis not present

## 2021-07-19 DIAGNOSIS — R1012 Left upper quadrant pain: Secondary | ICD-10-CM | POA: Diagnosis present

## 2021-07-19 DIAGNOSIS — Z8616 Personal history of COVID-19: Secondary | ICD-10-CM | POA: Insufficient documentation

## 2021-07-19 DIAGNOSIS — Z79899 Other long term (current) drug therapy: Secondary | ICD-10-CM | POA: Insufficient documentation

## 2021-07-19 DIAGNOSIS — I10 Essential (primary) hypertension: Secondary | ICD-10-CM | POA: Diagnosis not present

## 2021-07-19 DIAGNOSIS — K6389 Other specified diseases of intestine: Secondary | ICD-10-CM | POA: Diagnosis not present

## 2021-07-19 DIAGNOSIS — R1084 Generalized abdominal pain: Secondary | ICD-10-CM | POA: Diagnosis not present

## 2021-07-19 DIAGNOSIS — K3189 Other diseases of stomach and duodenum: Secondary | ICD-10-CM | POA: Diagnosis not present

## 2021-07-19 DIAGNOSIS — Z87891 Personal history of nicotine dependence: Secondary | ICD-10-CM | POA: Insufficient documentation

## 2021-07-19 DIAGNOSIS — N186 End stage renal disease: Secondary | ICD-10-CM | POA: Insufficient documentation

## 2021-07-19 LAB — CBC
HCT: 36.5 % — ABNORMAL LOW (ref 39.0–52.0)
Hemoglobin: 12.7 g/dL — ABNORMAL LOW (ref 13.0–17.0)
MCH: 35 pg — ABNORMAL HIGH (ref 26.0–34.0)
MCHC: 34.8 g/dL (ref 30.0–36.0)
MCV: 100.6 fL — ABNORMAL HIGH (ref 80.0–100.0)
Platelets: 198 10*3/uL (ref 150–400)
RBC: 3.63 MIL/uL — ABNORMAL LOW (ref 4.22–5.81)
RDW: 13.7 % (ref 11.5–15.5)
WBC: 7.9 10*3/uL (ref 4.0–10.5)
nRBC: 0 % (ref 0.0–0.2)

## 2021-07-19 LAB — COMPREHENSIVE METABOLIC PANEL
ALT: 32 U/L (ref 0–44)
AST: 54 U/L — ABNORMAL HIGH (ref 15–41)
Albumin: 4.8 g/dL (ref 3.5–5.0)
Alkaline Phosphatase: 61 U/L (ref 38–126)
Anion gap: 14 (ref 5–15)
BUN: 47 mg/dL — ABNORMAL HIGH (ref 6–20)
CO2: 33 mmol/L — ABNORMAL HIGH (ref 22–32)
Calcium: 9.9 mg/dL (ref 8.9–10.3)
Chloride: 87 mmol/L — ABNORMAL LOW (ref 98–111)
Creatinine, Ser: 12.31 mg/dL — ABNORMAL HIGH (ref 0.61–1.24)
GFR, Estimated: 5 mL/min — ABNORMAL LOW (ref >60)
Glucose, Bld: 89 mg/dL (ref 70–99)
Potassium: 4 mmol/L (ref 3.5–5.1)
Sodium: 134 mmol/L — ABNORMAL LOW (ref 135–145)
Total Bilirubin: 0.9 mg/dL (ref 0.3–1.2)
Total Protein: 8.6 g/dL — ABNORMAL HIGH (ref 6.5–8.1)

## 2021-07-19 LAB — LIPASE, BLOOD: Lipase: 39 U/L (ref 11–51)

## 2021-07-19 LAB — CBG MONITORING, ED: Glucose-Capillary: 85 mg/dL (ref 70–99)

## 2021-07-19 MED ORDER — HYDROMORPHONE HCL 1 MG/ML IJ SOLN
0.5000 mg | Freq: Once | INTRAMUSCULAR | Status: AC
  Administered 2021-07-19: 0.5 mg via INTRAVENOUS
  Filled 2021-07-19: qty 1

## 2021-07-19 MED ORDER — HALOPERIDOL LACTATE 5 MG/ML IJ SOLN
5.0000 mg | Freq: Once | INTRAMUSCULAR | Status: AC
  Administered 2021-07-19: 5 mg via INTRAVENOUS
  Filled 2021-07-19: qty 1

## 2021-07-19 MED ORDER — PANTOPRAZOLE SODIUM 40 MG IV SOLR
40.0000 mg | Freq: Once | INTRAVENOUS | Status: AC
  Administered 2021-07-19: 40 mg via INTRAVENOUS
  Filled 2021-07-19: qty 40

## 2021-07-19 MED ORDER — ONDANSETRON 4 MG PO TBDP: ORAL_TABLET | ORAL | 0 refills | Status: DC

## 2021-07-19 MED ORDER — PANTOPRAZOLE SODIUM 40 MG PO TBEC: 40.0000 mg | DELAYED_RELEASE_TABLET | Freq: Two times a day (BID) | ORAL | 0 refills | Status: DC

## 2021-07-19 NOTE — Discharge Instructions (Addendum)
Symptoms seem to be a chronic issue, we do not see any acute changes today.  Your CT scan is reassuring.  Please follow-up with your primary care doctor and GI doctor.  Please call your dialysis clinic today to get set up for a dialysis session since you missed your appointment this morning.  Use Bentyl that you were prescribed yesterday, you can also use prescribed Zofran as needed for nausea and vomiting.  You can take Protonix twice daily before breakfast and dinner to help decrease your pain.

## 2021-07-19 NOTE — ED Notes (Signed)
Provided with diet ginger ale for PO challenge.

## 2021-07-19 NOTE — ED Notes (Signed)
Pt is screaming out loudly and is inconsolable.

## 2021-07-19 NOTE — ED Provider Notes (Signed)
[facility] DEPT Provider Note   CSN: [id number] Arrival date & time: 07/19/21  9983     History Chief Complaint  Patient presents with   Abdominal Pain    Edward PAVON Sr. is a 50 y.o. male.  Edward [name] is a 50 y.o. male with hx of ESRD on HD, CHF, HTN, HLD, DM, GERD, who presents with abdominal pain, nausea and vomiting. Pt has been evaluated for similar symptoms multiple times, most recently yesterday. He reports severe pain primarily in the left upper quadrant. Pt has been having pain and vomiting for the past 4 days, treated yesterday in ED with improvement, but reports pain worsening this morning. Has not been able to tolerate PO at home.  Patient was supposed to go to dialysis this morning, but could not go because of pain and came to the emergency department instead.  Patient reports normal bowel movements, no blood in the stool.  No chest pain or shortness of breath.  No fevers or chills.  Denies previous abdominal surgeries.  Does report smoking marijuana most recently about a week ago.  Denies other drug use.  The history is provided by the patient, a relative and medical records.      Past Medical History:  Diagnosis Date   Acute on chronic renal failure ([place]) 11/25/2015   Anemia    Arthritis    Blind    Left eye   Cataract    CHF (congestive heart failure) (HCC)    Chronic kidney disease    stage 5   COVID-19    Depression    situatuional   Diabetes mellitus    Type II   GERD (gastroesophageal reflux disease)    Graves disease 2014   Headache    in past   Hx of adenomatous and sessile serrated colonic polyps 11/21/2019   Hyperlipidemia    Hypertension    Hypothyroidism    Legally blind    right eye   Neuropathy    Non-compliance    Proteinuria 05/24/2020   Shortness of breath dyspnea    "Better since I've been taking my medications"    Patient Active Problem List   Diagnosis Date Noted   ESRD (end  stage renal disease) on dialysis ([place])    Hypertension associated with stage 5 chronic kidney disease due to type 2 diabetes mellitus ([place]) 05/24/2020   Hyperphosphatemia 05/24/2020   Proteinuria 05/24/2020   Legally blind 05/24/2020   Diabetic retinopathy of both eyes ([place]) 05/24/2020   Pneumonia due to COVID-19 virus 05/23/2020   Chronic diastolic heart failure ([place]) 10/24/2018   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   Mitral regurgitation, Moderate 09/05/2018   ED (erectile dysfunction) 08/10/2017   Chronic kidney disease, stage 5 ([place]) 07/18/2017   Graves disease 03/02/2016   Non compliance with medical treatment 11/26/2015   Uncontrolled hypertension 11/26/2015   Acute on chronic renal failure ([place]) 11/25/2015   DM (diabetes mellitus), type 2 with ophthalmic complications ([place]) 38/25/0539    Past Surgical History:  Procedure Laterality Date   AV FISTULA PLACEMENT Right 04/22/2019   Procedure: RIGHT ARM ARTERIOVENOUS (AV) FISTULA CREATION;  Surgeon: Angelia Mould, MD;  Location: [facility] OR;  Service: Vascular;  Laterality: Right;   CIRCUMCISION     as a child, around 38 years of age   COLONOSCOPY     EYE SURGERY Bilateral    "several "   PARS PLANA VITRECTOMY Right 03/25/2016   Procedure: PARS PLANA VITRECTOMY 25  GAUGE FOR ENDOPHTHALMITIS;  Surgeon: Jalene Mullet, MD;  Location: [facility];  Service: Ophthalmology;  Laterality: Right;   WISDOM TOOTH EXTRACTION         Family History  Problem Relation Age of Onset   Hypertension Mother    Diabetes Mother    Bladder Cancer Brother    Kidney disease Other    Colon cancer Neg Hx    Rectal cancer Neg Hx    Esophageal cancer Neg Hx    Stomach cancer Neg Hx     Social History   Tobacco Use   Smoking status: Former    Packs/day: 0.75    Years: 3.00    Pack years: 2.25    Types: Cigarettes    Quit date: 05/28/2000    Years since quitting: 21.1   Smokeless tobacco: Never  Vaping Use   Vaping Use: Never used   Substance Use Topics   Alcohol use: Not Currently    Alcohol/week: 0.0 standard drinks    Comment: 1 beer  ocassional   Drug use: Yes    Types: Marijuana    Comment: daily for pain    Home Medications Prior to Admission medications   Medication Sig Start Date End Date Taking? Authorizing Provider  ondansetron (ZOFRAN ODT) 4 MG disintegrating tablet 49m ODT q4 hours prn nausea/vomit 07/19/21  Yes Kaulana Brindle N, PA-C  pantoprazole (PROTONIX) 40 MG tablet Take 1 tablet (40 mg total) by mouth 2 (two) times daily. 07/19/21  Yes FJacqlyn Larsen PA-C  Accu-Chek Softclix Lancets lancets Use as instructed 08/21/20   GPhilemon Kingdom MD  acetaminophen (TYLENOL) 500 MG tablet Take 500 mg by mouth every 6 (six) hours as needed.    [provider]  Alcohol Swabs (B-D SINGLE USE SWABS REGULAR) PADS Use as directed. 01/09/20   NCharlott Rakes MD  atorvastatin (LIPITOR) 40 MG tablet Take 1 tablet (40 mg total) by mouth daily. 10/22/19   NCharlott Rakes MD  Blood Glucose Monitoring Suppl (ACCU-CHEK GUIDE ME) w/Device KIT Use 3 times daily before meals 08/21/20   GPhilemon Kingdom MD  cinacalcet (SENSIPAR) 90 MG tablet Take 90 mg by mouth daily. 02/09/21   [provider]  diclofenac Sodium (VOLTAREN) 1 % GEL Apply 4 g topically 4 (four) times daily. 05/13/21   GGatha Mayer MD  dicyclomine (BENTYL) 20 MG tablet Take 1 tablet (20 mg total) by mouth 2 (two) times daily. 07/18/21   NVarney Biles MD  Doxercalciferol ([place]) Dialysis 06/14/21 06/13/22  [provider]  Emollient ([facility]) CREA Apply to dry skin as needed 07/17/20   FGildardo Pounds NP  furosemide (LASIX) 80 MG tablet Take 80 mg by mouth 2 (two) times daily. 09/02/20   [provider]  glipiZIDE (GLUCOTROL XL) 2.5 MG 24 hr tablet Take by mouth daily.    [provider]  glucose blood (ACCU-CHEK GUIDE) test strip Use as instructed 08/21/20   GPhilemon Kingdom MD  heparin  10000 unit/mL SOLN Dialysis 06/18/21 06/17/22  [provider]  IRON SUCROSE IV Iron Sucrose (Venofer) 02/24/21 03/17/21  [provider]  ketoconazole (NIZORAL) 2 % cream Apply 1 application topically daily. 08/11/20   MCriselda Peaches DPM  levothyroxine (SYNTHROID) 137 MCG tablet TAKE 1 TABLET(137 MCG) BY MOUTH DAILY BEFORE BREAKFAST 08/21/20   GPhilemon Kingdom MD  Methoxy PEG-Epoetin Beta (MIRCERA IJ) Dialysis 02/10/21 02/09/22  [provider]  oxyCODONE (OXY IR/ROXICODONE) 5 MG immediate release tablet Take 1 tablet (5 mg  total) by mouth every 4 (four) hours as needed for severe pain. 05/13/21   Gatha Mayer, MD  promethazine (PHENERGAN) 25 MG tablet Take 1 tablet (25 mg total) by mouth every 6 (six) hours as needed for nausea or vomiting. 05/13/21   Gatha Mayer, MD  SENSIPAR 30 MG tablet Take 30 mg by mouth daily. 09/22/20   [provider]  sevelamer carbonate (RENVELA) 800 MG tablet Take 1 tablet (800 mg total) by mouth 3 (three) times daily with meals. 05/29/20   Milus Banister C, DO  sildenafil (VIAGRA) 50 MG tablet TAKE 2 TABLETS BY MOUTH DAILY AS NEEDED FOR ERECTILE DYSFUNCTION. AT LEAST 24 HOURS BETWEEN DOSES 06/03/21   [name], MD    Allergies    Patient has no known allergies.  Review of Systems   Review of Systems  Constitutional:  Negative for chills and fever.  HENT: Negative.    Respiratory:  Negative for cough and shortness of breath.   Cardiovascular:  Negative for chest pain.  Gastrointestinal:  Positive for abdominal pain, nausea and vomiting. Negative for blood in stool, constipation and diarrhea.  Genitourinary:  Negative for dysuria.  Musculoskeletal:  Negative for arthralgias and myalgias.  Neurological:  Negative for syncope and light-headedness.  All other systems reviewed and are negative.  Physical Exam Updated Vital Signs BP (!) 153/83   Pulse 83   Temp (!) 97.4 F (36.3 C) (Oral)   Resp 18   SpO2 96%    Physical Exam Vitals and nursing note reviewed.  Constitutional:      General: He is not in acute distress.    Appearance: Normal appearance. He is well-developed. He is not ill-appearing or diaphoretic.     Comments: Pt appears uncomfortable, having difficulty sitting still  HENT:     Head: Normocephalic and atraumatic.  Eyes:     General:        Right eye: No discharge.        Left eye: No discharge.     Pupils: Pupils are equal, round, and reactive to light.  Cardiovascular:     Rate and Rhythm: Normal rate and regular rhythm.     Pulses: Normal pulses.     Heart sounds: Normal heart sounds.  Pulmonary:     Effort: Pulmonary effort is normal. No respiratory distress.     Breath sounds: Normal breath sounds. No wheezing or rales.     Comments: Respirations equal and unlabored, patient able to speak in full sentences, lungs clear to auscultation bilaterally  Abdominal:     General: Bowel sounds are normal. There is no distension.     Palpations: Abdomen is soft. There is no mass.     Tenderness: There is generalized abdominal tenderness. There is no guarding.     Comments: Abdomen soft, nondistended, mild generalized tenderness, most severe in the LUQ, no peritoneal signs  Musculoskeletal:        General: No deformity.     Cervical back: Neck supple.  Skin:    General: Skin is warm and dry.     Capillary Refill: Capillary refill takes less than 2 seconds.  Neurological:     Mental Status: He is alert and oriented to person, place, and time.     Coordination: Coordination normal.     Comments: Speech is clear, able to follow commands Moves extremities without ataxia, coordination intact  Psychiatric:        Mood and Affect: Mood normal.  Behavior: Behavior normal.    ED Results / Procedures / Treatments   Labs (all labs ordered are listed, but only abnormal results are displayed) Labs Reviewed  COMPREHENSIVE METABOLIC PANEL - Abnormal; Notable for the following  components:      Result Value   Sodium 134 (*)    Chloride 87 (*)    CO2 33 (*)    BUN 47 (*)    Creatinine, Ser 12.31 (*)    Total Protein 8.6 (*)    AST 54 (*)    GFR, Estimated 5 (*)    All other components within normal limits  CBC - Abnormal; Notable for the following components:   RBC 3.63 (*)    Hemoglobin 12.7 (*)    HCT 36.5 (*)    MCV 100.6 (*)    MCH 35.0 (*)    All other components within normal limits  LIPASE, BLOOD  CBG MONITORING, ED    EKG EKG Interpretation  Date/Time:  Monday July 19 2021 07:22:17 EDT Ventricular Rate:  79 PR Interval:  177 QRS Duration: 103 QT Interval:  407 QTC Calculation: 467 R Axis:   20 Text Interpretation: Sinus rhythm Low voltage, extremity leads RSR' in V1 or V2, probably normal variant No significant change since last tracing Confirmed by Deno Etienne [id number]) on 07/19/2021 9:32:35 AM  Radiology CT ABDOMEN PELVIS WO CONTRAST  Result Date: 07/19/2021 CLINICAL DATA:  Left lower quadrant pain, epigastric pain EXAM: CT ABDOMEN AND PELVIS WITHOUT CONTRAST TECHNIQUE: Multidetector CT imaging of the abdomen and pelvis was performed following the standard protocol without IV contrast. COMPARISON:  12/01/2020 FINDINGS: Lower chest: Small pericardial effusion as before. No pleural effusion. Lung bases clear. Hepatobiliary: No focal liver abnormality is seen. No gallstones, gallbladder wall thickening, or biliary dilatation. Pancreas: Unremarkable. No pancreatic ductal dilatation or surrounding inflammatory changes. Spleen: Normal in size without focal abnormality. Adrenals/Urinary Tract: Adrenal glands are unremarkable. Kidneys are normal, without renal calculi, focal lesion, or hydronephrosis. Bladder is unremarkable. Stomach/Bowel: Stomach is partially distended by ingested fluid. The small bowel is decompressed. Normal appendix. The colon is nondilated, unremarkable. Vascular/Lymphatic: Minimal calcified aortic plaque. No abdominal or pelvic  adenopathy. Reproductive: Prostate is unremarkable. Other: No ascites.  No free air. Musculoskeletal: No acute or significant osseous findings. IMPRESSION: 1. No acute findings. 2. Stable small pericardial effusion. 3.  Aortic Atherosclerosis (ICD10-170.0). Electronically Signed   By: Lucrezia Europe M.D.   On: 07/19/2021 08:09    Procedures Procedures   Medications Ordered in ED Medications  pantoprazole (PROTONIX) injection 40 mg (40 mg Intravenous Given 07/19/21 0727)  HYDROmorphone (DILAUDID) injection 0.5 mg (0.5 mg Intravenous Given 07/19/21 0723)  haloperidol lactate (HALDOL) injection 5 mg (5 mg Intravenous Given 07/19/21 5284)    ED Course  I have reviewed the triage vital signs and the nursing notes.  Pertinent labs & imaging results that were available during my care of the patient were reviewed by me and considered in my medical decision making (see chart for details).    MDM Rules/Calculators/A&P                           Patient presents to the ED with complaints of abdominal pain. Patient nontoxic appearing, in no apparent distress, vitals WNL. On exam patient tender to palpation primarily in the LUQ, no peritoneal signs. Will evaluate with labs and CT given multiple visits over the past few days. Analgesics, anti-emetics, and fluids administered.  Additional history obtained:  Additional history obtained from chart review & nursing note review.   Lab Tests:  I Ordered, reviewed, and interpreted labs, which included:  CBC: No leukocytosis, hgb at baseline CMP: K+ normal, CKD with BUN at baseline, due for dialysis today Lipase: WNL   Imaging Studies ordered:  I ordered imaging studies which included CT Abdomen Pelvis, I independently reviewed, formal radiology impression shows:  No acute abnormalities  ED Course:    RE-EVAL: Pain improved and patient sleeping comfortably, has been able tolerate PO  On repeat abdominal exam patient remains without peritoneal signs,  low suspicion for cholecystitis, pancreatitis, diverticulitis, appendicitis, bowel obstruction/perforation, or other acute surgical process. Patient tolerating PO in the emergency department. Will discharge home with supportive measures. I discussed results, treatment plan, need for PCP follow-up, and return precautions with the patient. Patient's sister at bedside will contact patient's dialysis clinic to get dialysis rescheduled this afternoon or tomorrow morning. Provided opportunity for questions, patient confirmed understanding and is in agreement with plan.    Portions of this note were generated with [employer]. Dictation errors may occur despite best attempts at proofreading.    Final Clinical Impression(s) / ED Diagnoses Final diagnoses:  Generalized abdominal pain  Nausea and vomiting, unspecified vomiting type    Rx / DC Orders ED Discharge Orders          Ordered    ondansetron (ZOFRAN ODT) 4 MG disintegrating tablet        07/19/21 0947    pantoprazole (PROTONIX) 40 MG tablet  2 times daily        07/19/21 0947             Jacqlyn Larsen, PA-C 07/19/21 [place], [place], DO 07/20/21 1136

## 2021-07-19 NOTE — Progress Notes (Signed)
Contacted by PA to request that pt's clinic be contacted regarding pt being in the ED and missing treatment this am. Spoke to [facility] staff who reports that pt's family has already contacted clinic regarding the above. Clinic checking to see if they have a spot for pt tomorrow and will advise family of this info once family returns call to clinic this afternoon.   Melven Sartorius Renal Navigator [phone]

## 2021-07-19 NOTE — ED Triage Notes (Addendum)
Patient was seen here yesterday for abdominal pain in the upper left quadrant. Same thing tonight. He said "I feel like im dying." No vomiting tonight. Patient bent over screaming in pain saying "I cant do this help me" as he is banging on the table.

## 2021-07-21 DIAGNOSIS — N186 End stage renal disease: Secondary | ICD-10-CM | POA: Diagnosis not present

## 2021-07-21 DIAGNOSIS — Z992 Dependence on renal dialysis: Secondary | ICD-10-CM | POA: Diagnosis not present

## 2021-07-21 DIAGNOSIS — N2581 Secondary hyperparathyroidism of renal origin: Secondary | ICD-10-CM | POA: Diagnosis not present

## 2021-07-23 DIAGNOSIS — N186 End stage renal disease: Secondary | ICD-10-CM | POA: Diagnosis not present

## 2021-07-23 DIAGNOSIS — N2581 Secondary hyperparathyroidism of renal origin: Secondary | ICD-10-CM | POA: Diagnosis not present

## 2021-07-23 DIAGNOSIS — Z992 Dependence on renal dialysis: Secondary | ICD-10-CM | POA: Diagnosis not present

## 2021-07-26 DIAGNOSIS — N186 End stage renal disease: Secondary | ICD-10-CM | POA: Diagnosis not present

## 2021-07-26 DIAGNOSIS — Z992 Dependence on renal dialysis: Secondary | ICD-10-CM | POA: Diagnosis not present

## 2021-07-26 DIAGNOSIS — N2581 Secondary hyperparathyroidism of renal origin: Secondary | ICD-10-CM | POA: Diagnosis not present

## 2021-07-27 ENCOUNTER — Other Ambulatory Visit: Payer: Self-pay

## 2021-07-27 ENCOUNTER — Encounter: Payer: Self-pay | Admitting: Family Medicine

## 2021-07-27 ENCOUNTER — Ambulatory Visit: Payer: Medicare HMO | Attending: Family Medicine | Admitting: Family Medicine

## 2021-07-27 VITALS — BP 123/79 | HR 74 | Ht 71.0 in | Wt 190.0 lb

## 2021-07-27 DIAGNOSIS — E785 Hyperlipidemia, unspecified: Secondary | ICD-10-CM | POA: Diagnosis not present

## 2021-07-27 DIAGNOSIS — E1169 Type 2 diabetes mellitus with other specified complication: Secondary | ICD-10-CM | POA: Diagnosis not present

## 2021-07-27 DIAGNOSIS — Z992 Dependence on renal dialysis: Secondary | ICD-10-CM | POA: Diagnosis not present

## 2021-07-27 DIAGNOSIS — E1122 Type 2 diabetes mellitus with diabetic chronic kidney disease: Secondary | ICD-10-CM

## 2021-07-27 DIAGNOSIS — N186 End stage renal disease: Secondary | ICD-10-CM | POA: Diagnosis not present

## 2021-07-27 DIAGNOSIS — Z0001 Encounter for general adult medical examination with abnormal findings: Secondary | ICD-10-CM

## 2021-07-27 DIAGNOSIS — Z0000 Encounter for general adult medical examination without abnormal findings: Secondary | ICD-10-CM

## 2021-07-27 DIAGNOSIS — Z1159 Encounter for screening for other viral diseases: Secondary | ICD-10-CM

## 2021-07-27 MED ORDER — ATORVASTATIN CALCIUM 40 MG PO TABS: 40.0000 mg | ORAL_TABLET | Freq: Every day | ORAL | 6 refills | Status: DC

## 2021-07-27 NOTE — ED Provider Notes (Signed)
[facility] DEPT Provider Note   CSN: [id number] Arrival date & time: 07/18/21  0725     History Chief Complaint  Patient presents with   Abdominal Pain    Edward [name] Sr. is a 51 y.o. male.  HPI    50 year old male comes in with chief complaint of abdominal pain.  Patient has history of CHF, CKD stage V, diabetes and chronic abdominal pain.  He reports that his current pain started 2 or 3 days ago and has gotten worse.  He is also having hiccups.  Abdominal pain is generalized and described as burning and sharp pain that is severe.  He takes over-the-counter medications for pain control.  There is no specific trigger.  He has seen GI doctors in the past.  Review of system is negative for any URI-like symptoms, body aches, fevers, chills.  Symptoms similar to prior episodes.  Past Medical History:  Diagnosis Date   Acute on chronic renal failure ([place]) 11/25/2015   Anemia    Arthritis    Blind    Left eye   Cataract    CHF (congestive heart failure) (HCC)    Chronic kidney disease    stage 5   COVID-19    Depression    situatuional   Diabetes mellitus    Type II   GERD (gastroesophageal reflux disease)    Graves disease 2014   Headache    in past   Hx of adenomatous and sessile serrated colonic polyps 11/21/2019   Hyperlipidemia    Hypertension    Hypothyroidism    Legally blind    right eye   Neuropathy    Non-compliance    Proteinuria 05/24/2020   Shortness of breath dyspnea    "Better since I've been taking my medications"    Patient Active Problem List   Diagnosis Date Noted   ESRD (end stage renal disease) on dialysis ([place])    Hypertension associated with stage 5 chronic kidney disease due to type 2 diabetes mellitus ([place]) 05/24/2020   Hyperphosphatemia 05/24/2020   Proteinuria 05/24/2020   Legally blind 05/24/2020   Diabetic retinopathy of both eyes ([place]) 05/24/2020   Pneumonia due to COVID-19 virus 05/23/2020    Chronic diastolic heart failure ([place]) 10/24/2018   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   Mitral regurgitation, Moderate 09/05/2018   ED (erectile dysfunction) 08/10/2017   Chronic kidney disease, stage 5 ([place]) 07/18/2017   Graves disease 03/02/2016   Non compliance with medical treatment 11/26/2015   Uncontrolled hypertension 11/26/2015   Acute on chronic renal failure ([place]) 11/25/2015   DM (diabetes mellitus), type 2 with ophthalmic complications ([place]) 35/45/6256    Past Surgical History:  Procedure Laterality Date   AV FISTULA PLACEMENT Right 04/22/2019   Procedure: RIGHT ARM ARTERIOVENOUS (AV) FISTULA CREATION;  Surgeon: Angelia Mould, MD;  Location: [facility] OR;  Service: Vascular;  Laterality: Right;   CIRCUMCISION     as a child, around 10 years of age   COLONOSCOPY     EYE SURGERY Bilateral    "several "   PARS PLANA VITRECTOMY Right 03/25/2016   Procedure: PARS PLANA VITRECTOMY 25 GAUGE FOR ENDOPHTHALMITIS;  Surgeon: Jalene Mullet, MD;  Location: [facility];  Service: Ophthalmology;  Laterality: Right;   WISDOM TOOTH EXTRACTION         Family History  Problem Relation Age of Onset   Hypertension Mother    Diabetes Mother    Bladder Cancer Brother    Kidney disease Other  Colon cancer Neg Hx    Rectal cancer Neg Hx    Esophageal cancer Neg Hx    Stomach cancer Neg Hx     Social History   Tobacco Use   Smoking status: Former    Packs/day: 0.75    Years: 3.00    Pack years: 2.25    Types: Cigarettes    Quit date: 05/28/2000    Years since quitting: 21.1   Smokeless tobacco: Never  Vaping Use   Vaping Use: Never used  Substance Use Topics   Alcohol use: Not Currently    Alcohol/week: 0.0 standard drinks    Comment: 1 beer  ocassional   Drug use: Yes    Types: Marijuana    Comment: daily for pain    Home Medications Prior to Admission medications   Medication Sig Start Date End Date Taking? Authorizing Provider  dicyclomine (BENTYL) 20 MG tablet  Take 1 tablet (20 mg total) by mouth 2 (two) times daily. 07/18/21  Yes Varney Biles, MD  Accu-Chek Softclix Lancets lancets Use as instructed 08/21/20   Philemon Kingdom, MD  acetaminophen (TYLENOL) 500 MG tablet Take 500 mg by mouth every 6 (six) hours as needed.    [provider]  Alcohol Swabs (B-D SINGLE USE SWABS REGULAR) PADS Use as directed. 01/09/20   [name], MD  atorvastatin (LIPITOR) 40 MG tablet Take 1 tablet (40 mg total) by mouth daily. 10/22/19   [name], MD  Blood Glucose Monitoring Suppl (ACCU-CHEK GUIDE ME) w/Device KIT Use 3 times daily before meals 08/21/20   Philemon Kingdom, MD  cinacalcet (SENSIPAR) 90 MG tablet Take 90 mg by mouth daily. 02/09/21   [provider]  diclofenac Sodium (VOLTAREN) 1 % GEL Apply 4 g topically 4 (four) times daily. 05/13/21   Gatha Mayer, MD  Doxercalciferol ([place] IV) Dialysis 06/14/21 06/13/22  [provider]  Emollient [facility] ADVANCED REPAIR) CREA Apply to dry skin as needed 07/17/20   Gildardo Pounds, NP  furosemide (LASIX) 80 MG tablet Take 80 mg by mouth 2 (two) times daily. 09/02/20   [provider]  glipiZIDE (GLUCOTROL XL) 2.5 MG 24 hr tablet Take by mouth daily.    [provider]  glucose blood (ACCU-CHEK GUIDE) test strip Use as instructed 08/21/20   Philemon Kingdom, MD  heparin 10000 unit/mL SOLN Dialysis 06/18/21 06/17/22  [provider]  IRON SUCROSE IV Iron Sucrose (Venofer) 02/24/21 03/17/21  [provider]  ketoconazole (NIZORAL) 2 % cream Apply 1 application topically daily. 08/11/20   Criselda Peaches, DPM  levothyroxine (SYNTHROID) 137 MCG tablet TAKE 1 TABLET(137 MCG) BY MOUTH DAILY BEFORE BREAKFAST 08/21/20   Philemon Kingdom, MD  Methoxy PEG-Epoetin Beta (MIRCERA IJ) Dialysis 02/10/21 02/09/22  [provider]  ondansetron (ZOFRAN ODT) 4 MG disintegrating tablet 106m ODT q4 hours prn nausea/vomit 07/19/21   FJacqlyn Larsen PA-C   oxyCODONE (OXY IR/ROXICODONE) 5 MG immediate release tablet Take 1 tablet (5 mg total) by mouth every 4 (four) hours as needed for severe pain. 05/13/21   GGatha Mayer MD  pantoprazole (PROTONIX) 40 MG tablet Take 1 tablet (40 mg total) by mouth 2 (two) times daily. 07/19/21   FJacqlyn Larsen PA-C  promethazine (PHENERGAN) 25 MG tablet Take 1 tablet (25 mg total) by mouth every 6 (six) hours as needed for nausea or vomiting. 05/13/21   GGatha Mayer MD  SENSIPAR 30 MG tablet Take 30 mg by mouth daily. 09/22/20  [provider]  sevelamer carbonate (RENVELA) 800 MG tablet Take 1 tablet (800 mg total) by mouth 3 (three) times daily with meals. 05/29/20   Milus Banister C, DO  sildenafil (VIAGRA) 50 MG tablet TAKE 2 TABLETS BY MOUTH DAILY AS NEEDED FOR ERECTILE DYSFUNCTION. AT LEAST 24 HOURS BETWEEN DOSES 06/03/21   [name], MD    Allergies    Patient has no known allergies.  Review of Systems   Review of Systems  Constitutional:  Positive for activity change.  Gastrointestinal:  Positive for abdominal pain, nausea and vomiting.  All other systems reviewed and are negative.  Physical Exam Updated Vital Signs BP 129/81   Pulse 76   Temp 99.2 F (37.3 C) (Oral)   Resp 14   SpO2 100%   Physical Exam Vitals and nursing note reviewed.  Constitutional:      Appearance: He is well-developed.  HENT:     Head: Atraumatic.  Cardiovascular:     Rate and Rhythm: Normal rate.  Pulmonary:     Effort: Pulmonary effort is normal.  Abdominal:     General: Abdomen is flat.     Palpations: Abdomen is soft.     Tenderness: There is generalized abdominal tenderness.  Musculoskeletal:     Cervical back: Neck supple.  Skin:    General: Skin is warm.  Neurological:     Mental Status: He is alert and oriented to person, place, and time.    ED Results / Procedures / Treatments   Labs (all labs ordered are listed, but only abnormal results are displayed) Labs Reviewed   CBC WITH DIFFERENTIAL/PLATELET - Abnormal; Notable for the following components:      Result Value   WBC 12.1 (*)    RBC 3.90 (*)    MCV 100.3 (*)    MCH 35.4 (*)    Neutro Abs 9.9 (*)    All other components within normal limits  BASIC METABOLIC PANEL - Abnormal; Notable for the following components:   Chloride 87 (*)    CO2 35 (*)    BUN 33 (*)    Creatinine, Ser 10.27 (*)    Calcium 11.1 (*)    GFR, Estimated 6 (*)    All other components within normal limits    EKG EKG Interpretation  Date/Time:  Sunday July 18 2021 09:23:20 EDT Ventricular Rate:  80 PR Interval:  175 QRS Duration: 101 QT Interval:  411 QTC Calculation: 475 R Axis:   -36 Text Interpretation: Sinus rhythm Left axis deviation Low voltage, extremity leads Confirmed by Orpah Greek [phone]) on 07/19/2021 9:22:08 AM  Radiology No results found.  Procedures Procedures   Medications Ordered in ED Medications  haloperidol lactate (HALDOL) injection 5 mg (5 mg Intravenous Given 07/18/21 0926)  fentaNYL (SUBLIMAZE) injection 50 mcg (50 mcg Intravenous Given 07/18/21 0925)  oxyCODONE-acetaminophen (PERCOCET/ROXICET) 5-325 MG per tablet 1 tablet (1 tablet Oral Given 07/18/21 1128)  ondansetron (ZOFRAN-ODT) disintegrating tablet 4 mg (4 mg Oral Given 07/18/21 1128)  dicyclomine (BENTYL) capsule 10 mg (10 mg Oral Given 07/18/21 1128)    ED Course  I have reviewed the triage vital signs and the nursing notes.  Pertinent labs & imaging results that were available during my care of the patient were reviewed by me and considered in my medical decision making (see chart for details).    MDM Rules/Calculators/A&P  50 year old comes in with chief complaint of abdominal pain. Patient is noted to have generalized abdominal pain, worse over the lower quadrants and periumbilical region.  He is slightly dry, abdomen is soft.  Pain appears to be chronic in nature.  Patient has  history of CKD, diabetes and chronic abdominal pain.  He had COVID-19 earlier in the year.  I reviewed previous records including CT scan that patient had this year.  He had CAT scans x 4 looking at his abdomen including a CT angiogram in March, which were all reassuring.  Have also reviewed patient's prior upper endoscopy and capsule endoscopy, there was some duodenitis noted.  Abdomen is soft, nonperitoneal.  We will give him Haldol.  Labs are reassuring.  Hemodynamics are stable and within normal limits.  If patient's symptoms improve, then we should be able to manage him as an outpatient, with focus on symptom control.  I informed patient that in the ED, chances of [place] under covering the underlying cause is extremely limited, and that he is getting appropriate outpatient management which is reassuring.  Final Clinical Impression(s) / ED Diagnoses Final diagnoses:  Abdominal cramping  Nausea    Rx / DC Orders ED Discharge Orders          Ordered    dicyclomine (BENTYL) 20 MG tablet  2 times daily        07/18/21 1112             Varney Biles, MD 07/27/21 1027

## 2021-07-27 NOTE — Progress Notes (Signed)
Subjective:   Edward ROHR Sr. is a 50 y.o. male who presents for Medicare Annual/Subsequent preventive examination. Medical history significant for hypertension, type 2 diabetes mellitus (A1c 7.0), Graves' disease (status post radioactive iodine treatment in 07/2017), ESRD (on HD M,W,F), erectile dysfunction.  He has not been taking his Glipizide neither has he been checking his blood sugars. 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 He has an Ophthalmologist (Dr Posey Pronto) and is due for an eye exam. He has not been taking his cholesterol medications nor his other medications; the only thing he takes 'is marijuana'. He smokes THC for pain as he is always in pain. Hemodialysis sessions are going well and he is currently on the kidney transplant list.  Review of Systems    General: negative for fever, weight loss, appetite change Eyes: Left eye visual loss. ENT: no ear symptoms, no sinus tenderness, no nasal congestion or sore throat. Neck: no pain  Respiratory: no wheezing, shortness of breath, cough Cardiovascular: no chest pain, no dyspnea on exertion, no pedal edema, no orthopnea. Gastrointestinal: no abdominal pain, no diarrhea, no constipation Genito-Urinary: no urinary frequency, no dysuria, no polyuria. Hematologic: no bruising Endocrine: no cold or heat intolerance Neurological: no headaches, no seizures, no tremors Musculoskeletal: no joint pains, no joint swelling Skin: no pruritus, no rash. Psychological: no depression, no anxiety,         Objective:    Today's Vitals   07/27/21 1051  BP: 123/79  Pulse: 74  SpO2: 100%  Weight: 190 lb (86.2 kg)  Height: _0  (1.803 m)  PainSc: 6    Body mass index is 26.5 kg/m.  Advanced Directives 07/27/2021 07/19/2021 07/18/2021 07/16/2021 05/27/2021 04/21/2021 02/15/2021  Does Patient Have a Medical Advance Directive? _1  No No  Would patient like information on creating a medical advance  directive? - No - Patient declined No - Patient declined - No - Patient declined No - Patient declined -    Current Medications (verified) Outpatient Encounter Medications as of 07/27/2021  Medication Sig   Accu-Chek Softclix Lancets lancets Use as instructed   acetaminophen (TYLENOL) 500 MG tablet Take 500 mg by mouth every 6 (six) hours as needed.   Alcohol Swabs (B-D SINGLE USE SWABS REGULAR) PADS Use as directed.   atorvastatin (LIPITOR) 40 MG tablet Take 1 tablet (40 mg total) by mouth daily.   Blood Glucose Monitoring Suppl (ACCU-CHEK GUIDE ME) w/Device KIT Use 3 times daily before meals   cinacalcet (SENSIPAR) 90 MG tablet Take 90 mg by mouth daily.   diclofenac Sodium (VOLTAREN) 1 % GEL Apply 4 g topically 4 (four) times daily.   dicyclomine (BENTYL) 20 MG tablet Take 1 tablet (20 mg total) by mouth 2 (two) times daily.   Doxercalciferol (HECTOROL IV) Dialysis   Emollient (EUCERIN ADVANCED REPAIR) CREA Apply to dry skin as needed   furosemide (LASIX) 80 MG tablet Take 80 mg by mouth 2 (two) times daily.   glipiZIDE (GLUCOTROL XL) 2.5 MG 24 hr tablet Take by mouth daily.   glucose blood (ACCU-CHEK GUIDE) test strip Use as instructed   heparin 10000 unit/mL SOLN Dialysis   ketoconazole (NIZORAL) 2 % cream Apply 1 application topically daily.   levothyroxine (SYNTHROID) 137 MCG tablet TAKE 1 TABLET(137 MCG) BY MOUTH DAILY BEFORE BREAKFAST   Methoxy PEG-Epoetin Beta (MIRCERA IJ) Dialysis   ondansetron (ZOFRAN ODT) 4 MG disintegrating tablet 37m ODT q4 hours prn nausea/vomit  oxyCODONE (OXY IR/ROXICODONE) 5 MG immediate release tablet Take 1 tablet (5 mg total) by mouth every 4 (four) hours as needed for severe pain.   pantoprazole (PROTONIX) 40 MG tablet Take 1 tablet (40 mg total) by mouth 2 (two) times daily.   promethazine (PHENERGAN) 25 MG tablet Take 1 tablet (25 mg total) by mouth every 6 (six) hours as needed for nausea or vomiting.   SENSIPAR 30 MG tablet Take 30 mg by mouth  daily.   sevelamer carbonate (RENVELA) 800 MG tablet Take 1 tablet (800 mg total) by mouth 3 (three) times daily with meals.   sildenafil (VIAGRA) 50 MG tablet TAKE 2 TABLETS BY MOUTH DAILY AS NEEDED FOR ERECTILE DYSFUNCTION. AT LEAST 24 HOURS BETWEEN DOSES   IRON SUCROSE IV Iron Sucrose (Venofer)   No facility-administered encounter medications on file as of 07/27/2021.    Allergies (verified) Patient has no known allergies.   History: Past Medical History:  Diagnosis Date   Acute on chronic renal failure ([place]) 11/25/2015   Anemia    Arthritis    Blind    Left eye   Cataract    CHF (congestive heart failure) (HCC)    Chronic kidney disease    stage 5   COVID-19    Depression    situatuional   Diabetes mellitus    Type II   GERD (gastroesophageal reflux disease)    Graves disease 2014   Headache    in past   Hx of adenomatous and sessile serrated colonic polyps 11/21/2019   Hyperlipidemia    Hypertension    Hypothyroidism    Legally blind    right eye   Neuropathy    Non-compliance    Proteinuria 05/24/2020   Shortness of breath dyspnea    "Better since I've been taking my medications"   Past Surgical History:  Procedure Laterality Date   AV FISTULA PLACEMENT Right 04/22/2019   Procedure: RIGHT ARM ARTERIOVENOUS (AV) FISTULA CREATION;  Surgeon: Angelia Mould, MD;  Location: [facility];  Service: Vascular;  Laterality: Right;   CIRCUMCISION     as a child, around 89 years of age   COLONOSCOPY     EYE SURGERY Bilateral    "several "   PARS PLANA VITRECTOMY Right 03/25/2016   Procedure: PARS PLANA VITRECTOMY 25 GAUGE FOR ENDOPHTHALMITIS;  Surgeon: Jalene Mullet, MD;  Location: [facility];  Service: Ophthalmology;  Laterality: Right;   WISDOM TOOTH EXTRACTION     Family History  Problem Relation Age of Onset   Hypertension Mother    Diabetes Mother    Bladder Cancer Brother    Kidney disease Other    Colon cancer Neg Hx    Rectal cancer Neg Hx    Esophageal  cancer Neg Hx    Stomach cancer Neg Hx    Social History   Socioeconomic History   Marital status: Divorced    Spouse name: Not on file   Number of children: 68   Years of education: Not on file   Highest education level: Not on file  Occupational History   Occupation: disabled  Tobacco Use   Smoking status: Former    Packs/day: 0.75    Years: 3.00    Pack years: 2.25    Types: Cigarettes    Quit date: 05/28/2000    Years since quitting: 21.1   Smokeless tobacco: Never  Vaping Use   Vaping Use: Never used  Substance and Sexual Activity   Alcohol use: Not  Currently    Alcohol/week: 0.0 standard drinks    Comment: 1 beer  ocassional   Drug use: Yes    Types: Marijuana    Comment: daily for pain   Sexual activity: Not on file  Other Topics Concern   Not on file  Social History Narrative   Divorced   14 children   44 yo son with him at home   Disabled chef - cafeterias, Chiropodist, [employer], etc      Former cigarette smoker, marijuana now, rare alcohol, other drugs   Social Determinants of Radio broadcast assistant Strain: Not on file  Food Insecurity: Not on file  Transportation Needs: Not on file  Physical Activity: Not on file  Stress: Not on file  Social Connections: Not on file    Tobacco Counseling Counseling given: Not Answered   Clinical Intake:  Pre-visit preparation completed: No  Pain : 0-10 Pain Score: 6  Pain Location: Abdomen     Diabetes: Yes CBG done?: No     Diabetic?yes  Interpreter Needed?: No      Activities of Daily Living In your present state of health, do you have any difficulty performing the following activities: 07/27/2021  Hearing? Y  Vision? N  Difficulty concentrating or making decisions? N  Walking or climbing stairs? Y  Dressing or bathing? Y  Doing errands, shopping? Y  Preparing Food and eating ? N  Using the Toilet? N  In the past six months, have you accidently leaked urine? N  Do you have  problems with loss of bowel control? N  Managing your Medications? N  Managing your Finances? N  Housekeeping or managing your Housekeeping? N  Some recent data might be hidden    Patient Care Team: [name], MD as PCP - General (Family Medicine) Buford Dresser, MD as PCP - Cardiology (Cardiology)  Indicate any recent Medical Services you may have received from other than [facility] providers in the past year (date may be approximate).     Constitutional: normal appearing,  Eyes: L eye blind HEENT: Head is atraumatic, normal sinuses, normal oropharynx, normal appearing tonsils and palate, tympanic membrane is normal bilaterally. Neck: normal range of motion, no thyromegaly, no JVD Cardiovascular: normal rate and rhythm, normal heart sounds, no murmurs, rub or gallop, no pedal edema Respiratory: Normal breath sounds, clear to auscultation bilaterally, no wheezes, no rales, no rhonchi Abdomen: soft, not tender to palpation, normal bowel sounds, no enlarged organs Musculoskeletal: Full ROM, no tenderness in joints.  Right AV brachiocephalic fistula with palpable thrill Skin: warm and dry, no lesions. Neurological: alert, oriented x3, cranial nerves I-XII grossly intact , normal motor strength, normal sensation. Psychological: normal mood.  Assessment:   This is a routine wellness examination for Edward [name].  Hearing/Vision screen No results found.  Dietary issues and exercise activities discussed:     Goals Addressed   None    Depression Screen PHQ 2/9 Scores 07/27/2021 09/15/2020 07/17/2020 01/22/2020 11/27/2019 10/30/2018 06/06/2018  PHQ - 2 Score _0 0 _1 PHQ- 9 Score _2 0 _3 Fall Risk Fall Risk  07/27/2021 09/15/2020 01/22/2020 11/27/2019 10/15/2019  Falls in the past year? 1 0 1 0 0  Number falls in past yr: 0 0 0 - -  Injury with Fall? 0 0 1 - -  Follow up - - Falls evaluation completed - -    FALL RISK PREVENTION PERTAINING TO THE HOME:  Any  stairs in or around the home? Yes  If so, are there any without handrails? No Home free of loose throw rugs in walkways, pet beds, electrical cords, etc? Yes  Adequate lighting in your home to reduce risk of falls? Yes   ASSISTIVE DEVICES UTILIZED TO PREVENT FALLS:  Life alert? No  Use of a cane, walker or w/c? No  Grab bars in the bathroom? Yes  Shower chair or bench in shower? No  Elevated toilet seat or a handicapped toilet? No   TIMED UP AND GO:  Was the test performed? No .  Length of time to ambulate 10 feet: N/a.   Gait slow and steady without use of assistive device  Cognitive Function:        Immunizations Immunization History  Administered Date(s) Administered   Influenza,inj,Quad PF,6+ Mos 09/19/2016   Influenza-Unspecified 06/03/2020   Pneumococcal Polysaccharide-23 09/19/2016   Tdap 02/20/2012    TDAP status: Due, Education has been provided regarding the importance of this vaccine. Advised may receive this vaccine at local pharmacy or Health Dept. Aware to provide a copy of the vaccination record if obtained from local pharmacy or Health Dept. Verbalized acceptance and understanding.  Flu Vaccine status: Up to date  Pneumococcal vaccine status: Declined,  Education has been provided regarding the importance of this vaccine but patient still declined. Advised may receive this vaccine at local pharmacy or Health Dept. Aware to provide a copy of the vaccination record if obtained from local pharmacy or Health Dept. Verbalized acceptance and understanding.   Qualifies for Shingles Vaccine? Yes   Zostavax completed No   Shingrix Completed?: No.    Education has been provided regarding the importance of this vaccine. Patient has been advised to call insurance company to determine out of pocket expense if they have not yet received this vaccine. Advised may also receive vaccine at local pharmacy or Health Dept. Verbalized acceptance and understanding.  Screening  Tests Health Maintenance  Topic Date Due   Hepatitis C Screening  Never done   Zoster Vaccines- Shingrix (1 of 2) Never done   Pneumococcal Vaccine 64-54 Years old (2 - PCV) 09/19/2017   OPHTHALMOLOGY EXAM  09/30/2020   HEMOGLOBIN A1C  02/18/2021   COVID-19 Vaccine (3 - Pfizer risk series) 04/28/2021   URINE MICROALBUMIN  08/27/2021 (Originally 06/12/2019)   TETANUS/TDAP  02/19/2022   FOOT EXAM  06/29/2022   COLONOSCOPY (Pts 45-50yr Insurance coverage will need to be confirmed)  11/12/2022   INFLUENZA VACCINE  Completed   HIV Screening  Completed   HPV VACCINES  Aged Out    Health Maintenance  Health Maintenance Due  Topic Date Due   Hepatitis C Screening  Never done   Zoster Vaccines- Shingrix (1 of 2) Never done   Pneumococcal Vaccine 123674Years old (2 - PCV) 09/19/2017   OPHTHALMOLOGY EXAM  09/30/2020   HEMOGLOBIN A1C  02/18/2021   COVID-19 Vaccine (3 - Pfizer risk series) 04/28/2021    Colorectal cancer screening: Type of screening: Colonoscopy. Completed 11/2019. Repeat every 3 years  Additional Screening:  Hepatitis C Screening: does qualify; Completed today  Dental Screening: Recommended annual dental exams for proper oral hygiene  Community Resource Referral / Chronic Care Management: CRR required this visit?  No   CCM required this visit?  No      Plan:   1. Encounter for Medicare annual wellness exam Counseled on 150 minutes of exercise per week, healthy eating (including decreased daily intake of saturated  fats, cholesterol, added sugars, sodium), routine healthcare maintenance. Provided advanced care directive packet  2. Type 2 diabetes mellitus with chronic kidney disease on chronic dialysis, without long-term current use of insulin (HCC) Controlled with A1c of 7.0 He has not been taking his glipizide Will send of A1c and make recommendation based on results Continue hemodialysis as per schedule and keep appointment with transplant clinic -  Hemoglobin A1c  3. Hyperlipidemia associated with type 2 diabetes mellitus ([place]) Not compliant with his statin Advised of the need to comply with his statin for primary cardiovascular disease prevention - atorvastatin (LIPITOR) 40 MG tablet; Take 1 tablet (40 mg total) by mouth daily.  Dispense: 30 tablet; Refill: 6  4. Screening for viral disease - HCV Ab w Reflex to Quant PCR   I have personally reviewed and noted the following in the patient's chart:   Medical and social history Use of alcohol, tobacco or illicit drugs  Current medications and supplements including opioid prescriptions. Patient is not currently taking opioid prescriptions. Functional ability and status Nutritional status Physical activity Advanced directives List of other physicians Hospitalizations, surgeries, and ER visits in previous 12 months Vitals Screenings to include cognitive, depression, and falls Referrals and appointments  In addition, I have reviewed and discussed with patient certain preventive protocols, quality metrics, and best practice recommendations. A written personalized care plan for preventive services as well as general preventive health recommendations were provided to patient.     [name], MD   07/27/2021

## 2021-07-27 NOTE — Progress Notes (Signed)
Medicare wellness

## 2021-07-28 DIAGNOSIS — N186 End stage renal disease: Secondary | ICD-10-CM | POA: Diagnosis not present

## 2021-07-28 DIAGNOSIS — Z992 Dependence on renal dialysis: Secondary | ICD-10-CM | POA: Diagnosis not present

## 2021-07-28 DIAGNOSIS — N2581 Secondary hyperparathyroidism of renal origin: Secondary | ICD-10-CM | POA: Diagnosis not present

## 2021-07-29 DIAGNOSIS — U071 COVID-19: Secondary | ICD-10-CM | POA: Diagnosis not present

## 2021-07-29 DIAGNOSIS — J9621 Acute and chronic respiratory failure with hypoxia: Secondary | ICD-10-CM | POA: Diagnosis not present

## 2021-07-30 ENCOUNTER — Telehealth: Payer: Self-pay | Admitting: Family Medicine

## 2021-07-30 DIAGNOSIS — N186 End stage renal disease: Secondary | ICD-10-CM | POA: Diagnosis not present

## 2021-07-30 DIAGNOSIS — Z992 Dependence on renal dialysis: Secondary | ICD-10-CM | POA: Diagnosis not present

## 2021-07-30 DIAGNOSIS — N2581 Secondary hyperparathyroidism of renal origin: Secondary | ICD-10-CM | POA: Diagnosis not present

## 2021-07-30 NOTE — Telephone Encounter (Signed)
Copied from [place] [id number]. Topic: General - Other >> Jul 28, 2021  8:53 AM Alanda Slim E wrote: Reason for CRM: Pt forgot to ask about something at his appt yesterday and asked the nurse please give him a call/ it is about medication / please advise

## 2021-08-02 DIAGNOSIS — Z992 Dependence on renal dialysis: Secondary | ICD-10-CM | POA: Diagnosis not present

## 2021-08-02 DIAGNOSIS — E1129 Type 2 diabetes mellitus with other diabetic kidney complication: Secondary | ICD-10-CM | POA: Diagnosis not present

## 2021-08-02 DIAGNOSIS — N186 End stage renal disease: Secondary | ICD-10-CM | POA: Diagnosis not present

## 2021-08-02 DIAGNOSIS — N2581 Secondary hyperparathyroidism of renal origin: Secondary | ICD-10-CM | POA: Diagnosis not present

## 2021-08-03 DIAGNOSIS — N186 End stage renal disease: Secondary | ICD-10-CM | POA: Diagnosis not present

## 2021-08-03 DIAGNOSIS — Z992 Dependence on renal dialysis: Secondary | ICD-10-CM | POA: Diagnosis not present

## 2021-08-03 DIAGNOSIS — I771 Stricture of artery: Secondary | ICD-10-CM | POA: Diagnosis not present

## 2021-08-03 DIAGNOSIS — I871 Compression of vein: Secondary | ICD-10-CM | POA: Diagnosis not present

## 2021-08-03 NOTE — Telephone Encounter (Signed)
Pt was called to get more information on medication questions. Pt states that he is not at home and he will call back with medications that he needs refilled.

## 2021-08-04 DIAGNOSIS — N186 End stage renal disease: Secondary | ICD-10-CM | POA: Diagnosis not present

## 2021-08-04 DIAGNOSIS — Z992 Dependence on renal dialysis: Secondary | ICD-10-CM | POA: Diagnosis not present

## 2021-08-04 DIAGNOSIS — N2581 Secondary hyperparathyroidism of renal origin: Secondary | ICD-10-CM | POA: Diagnosis not present

## 2021-08-06 DIAGNOSIS — N2581 Secondary hyperparathyroidism of renal origin: Secondary | ICD-10-CM | POA: Diagnosis not present

## 2021-08-06 DIAGNOSIS — Z992 Dependence on renal dialysis: Secondary | ICD-10-CM | POA: Diagnosis not present

## 2021-08-06 DIAGNOSIS — N186 End stage renal disease: Secondary | ICD-10-CM | POA: Diagnosis not present

## 2021-08-09 DIAGNOSIS — N186 End stage renal disease: Secondary | ICD-10-CM | POA: Diagnosis not present

## 2021-08-09 DIAGNOSIS — N2581 Secondary hyperparathyroidism of renal origin: Secondary | ICD-10-CM | POA: Diagnosis not present

## 2021-08-09 DIAGNOSIS — Z992 Dependence on renal dialysis: Secondary | ICD-10-CM | POA: Diagnosis not present

## 2021-08-11 DIAGNOSIS — N2581 Secondary hyperparathyroidism of renal origin: Secondary | ICD-10-CM | POA: Diagnosis not present

## 2021-08-11 DIAGNOSIS — N186 End stage renal disease: Secondary | ICD-10-CM | POA: Diagnosis not present

## 2021-08-11 DIAGNOSIS — Z992 Dependence on renal dialysis: Secondary | ICD-10-CM | POA: Diagnosis not present

## 2021-08-13 DIAGNOSIS — N2581 Secondary hyperparathyroidism of renal origin: Secondary | ICD-10-CM | POA: Diagnosis not present

## 2021-08-13 DIAGNOSIS — N186 End stage renal disease: Secondary | ICD-10-CM | POA: Diagnosis not present

## 2021-08-13 DIAGNOSIS — Z992 Dependence on renal dialysis: Secondary | ICD-10-CM | POA: Diagnosis not present

## 2021-08-16 DIAGNOSIS — Z992 Dependence on renal dialysis: Secondary | ICD-10-CM | POA: Diagnosis not present

## 2021-08-16 DIAGNOSIS — N186 End stage renal disease: Secondary | ICD-10-CM | POA: Diagnosis not present

## 2021-08-16 DIAGNOSIS — N2581 Secondary hyperparathyroidism of renal origin: Secondary | ICD-10-CM | POA: Diagnosis not present

## 2021-08-18 DIAGNOSIS — Z992 Dependence on renal dialysis: Secondary | ICD-10-CM | POA: Diagnosis not present

## 2021-08-18 DIAGNOSIS — N2581 Secondary hyperparathyroidism of renal origin: Secondary | ICD-10-CM | POA: Diagnosis not present

## 2021-08-18 DIAGNOSIS — N186 End stage renal disease: Secondary | ICD-10-CM | POA: Diagnosis not present

## 2021-08-20 DIAGNOSIS — N186 End stage renal disease: Secondary | ICD-10-CM | POA: Diagnosis not present

## 2021-08-20 DIAGNOSIS — N2581 Secondary hyperparathyroidism of renal origin: Secondary | ICD-10-CM | POA: Diagnosis not present

## 2021-08-20 DIAGNOSIS — Z992 Dependence on renal dialysis: Secondary | ICD-10-CM | POA: Diagnosis not present

## 2021-08-23 DIAGNOSIS — Z992 Dependence on renal dialysis: Secondary | ICD-10-CM | POA: Diagnosis not present

## 2021-08-23 DIAGNOSIS — N186 End stage renal disease: Secondary | ICD-10-CM | POA: Diagnosis not present

## 2021-08-23 DIAGNOSIS — N2581 Secondary hyperparathyroidism of renal origin: Secondary | ICD-10-CM | POA: Diagnosis not present

## 2021-08-25 DIAGNOSIS — N2581 Secondary hyperparathyroidism of renal origin: Secondary | ICD-10-CM | POA: Diagnosis not present

## 2021-08-25 DIAGNOSIS — N186 End stage renal disease: Secondary | ICD-10-CM | POA: Diagnosis not present

## 2021-08-25 DIAGNOSIS — Z992 Dependence on renal dialysis: Secondary | ICD-10-CM | POA: Diagnosis not present

## 2021-08-28 DIAGNOSIS — N186 End stage renal disease: Secondary | ICD-10-CM | POA: Diagnosis not present

## 2021-08-28 DIAGNOSIS — N2581 Secondary hyperparathyroidism of renal origin: Secondary | ICD-10-CM | POA: Diagnosis not present

## 2021-08-28 DIAGNOSIS — Z992 Dependence on renal dialysis: Secondary | ICD-10-CM | POA: Diagnosis not present

## 2021-08-30 ENCOUNTER — Telehealth: Payer: Self-pay | Admitting: Podiatry

## 2021-08-30 ENCOUNTER — Telehealth: Payer: Self-pay | Admitting: Family Medicine

## 2021-08-30 DIAGNOSIS — N2581 Secondary hyperparathyroidism of renal origin: Secondary | ICD-10-CM | POA: Diagnosis not present

## 2021-08-30 DIAGNOSIS — N186 End stage renal disease: Secondary | ICD-10-CM | POA: Diagnosis not present

## 2021-08-30 DIAGNOSIS — Z992 Dependence on renal dialysis: Secondary | ICD-10-CM | POA: Diagnosis not present

## 2021-08-30 NOTE — Telephone Encounter (Signed)
Pt called and left message checking status of diabetic shoes.  I returned call after checking and we have not received the needed documents from pcp. I refaxed it and pt is calling pcp.

## 2021-08-30 NOTE — Telephone Encounter (Signed)
Pt is calling because [facility] has sent several orders to diabetic shoes. [facility] has sent several faxes and Dr. Margarita Rana has not responded to the orders. Please advise

## 2021-08-31 NOTE — Telephone Encounter (Signed)
Paperwork has been received and will be faxed once pcp signs off.

## 2021-09-01 DIAGNOSIS — N186 End stage renal disease: Secondary | ICD-10-CM | POA: Diagnosis not present

## 2021-09-01 DIAGNOSIS — N2581 Secondary hyperparathyroidism of renal origin: Secondary | ICD-10-CM | POA: Diagnosis not present

## 2021-09-01 DIAGNOSIS — Z992 Dependence on renal dialysis: Secondary | ICD-10-CM | POA: Diagnosis not present

## 2021-09-01 DIAGNOSIS — E1129 Type 2 diabetes mellitus with other diabetic kidney complication: Secondary | ICD-10-CM | POA: Diagnosis not present

## 2021-09-03 DIAGNOSIS — N186 End stage renal disease: Secondary | ICD-10-CM | POA: Diagnosis not present

## 2021-09-03 DIAGNOSIS — Z992 Dependence on renal dialysis: Secondary | ICD-10-CM | POA: Diagnosis not present

## 2021-09-03 DIAGNOSIS — N2581 Secondary hyperparathyroidism of renal origin: Secondary | ICD-10-CM | POA: Diagnosis not present

## 2021-09-06 DIAGNOSIS — N2581 Secondary hyperparathyroidism of renal origin: Secondary | ICD-10-CM | POA: Diagnosis not present

## 2021-09-06 DIAGNOSIS — N186 End stage renal disease: Secondary | ICD-10-CM | POA: Diagnosis not present

## 2021-09-06 DIAGNOSIS — Z992 Dependence on renal dialysis: Secondary | ICD-10-CM | POA: Diagnosis not present

## 2021-09-08 DIAGNOSIS — Z992 Dependence on renal dialysis: Secondary | ICD-10-CM | POA: Diagnosis not present

## 2021-09-08 DIAGNOSIS — N2581 Secondary hyperparathyroidism of renal origin: Secondary | ICD-10-CM | POA: Diagnosis not present

## 2021-09-08 DIAGNOSIS — N186 End stage renal disease: Secondary | ICD-10-CM | POA: Diagnosis not present

## 2021-09-10 DIAGNOSIS — N2581 Secondary hyperparathyroidism of renal origin: Secondary | ICD-10-CM | POA: Diagnosis not present

## 2021-09-10 DIAGNOSIS — N186 End stage renal disease: Secondary | ICD-10-CM | POA: Diagnosis not present

## 2021-09-10 DIAGNOSIS — Z992 Dependence on renal dialysis: Secondary | ICD-10-CM | POA: Diagnosis not present

## 2021-09-13 DIAGNOSIS — N2581 Secondary hyperparathyroidism of renal origin: Secondary | ICD-10-CM | POA: Diagnosis not present

## 2021-09-13 DIAGNOSIS — N186 End stage renal disease: Secondary | ICD-10-CM | POA: Diagnosis not present

## 2021-09-13 DIAGNOSIS — Z992 Dependence on renal dialysis: Secondary | ICD-10-CM | POA: Diagnosis not present

## 2021-09-15 ENCOUNTER — Telehealth: Payer: Self-pay | Admitting: Family Medicine

## 2021-09-15 DIAGNOSIS — N186 End stage renal disease: Secondary | ICD-10-CM | POA: Diagnosis not present

## 2021-09-15 DIAGNOSIS — Z992 Dependence on renal dialysis: Secondary | ICD-10-CM | POA: Diagnosis not present

## 2021-09-15 DIAGNOSIS — N2581 Secondary hyperparathyroidism of renal origin: Secondary | ICD-10-CM | POA: Diagnosis not present

## 2021-09-15 NOTE — Telephone Encounter (Signed)
No he does not.  I can complete paperwork.

## 2021-09-15 NOTE — Telephone Encounter (Signed)
Patient dropped off medical clearance for Dental treatment to be filled out by Dr. Margarita Rana. Placed in PCP box.

## 2021-09-15 NOTE — Telephone Encounter (Signed)
Does patient need a office visit.

## 2021-09-16 NOTE — Telephone Encounter (Signed)
Noted paperwork will be placed in PCP box.

## 2021-09-17 DIAGNOSIS — N2581 Secondary hyperparathyroidism of renal origin: Secondary | ICD-10-CM | POA: Diagnosis not present

## 2021-09-17 DIAGNOSIS — Z992 Dependence on renal dialysis: Secondary | ICD-10-CM | POA: Diagnosis not present

## 2021-09-17 DIAGNOSIS — N186 End stage renal disease: Secondary | ICD-10-CM | POA: Diagnosis not present

## 2021-09-20 DIAGNOSIS — N2581 Secondary hyperparathyroidism of renal origin: Secondary | ICD-10-CM | POA: Diagnosis not present

## 2021-09-20 DIAGNOSIS — Z992 Dependence on renal dialysis: Secondary | ICD-10-CM | POA: Diagnosis not present

## 2021-09-20 DIAGNOSIS — N186 End stage renal disease: Secondary | ICD-10-CM | POA: Diagnosis not present

## 2021-09-21 ENCOUNTER — Other Ambulatory Visit: Payer: Self-pay

## 2021-09-21 ENCOUNTER — Ambulatory Visit: Payer: Medicare HMO

## 2021-09-21 DIAGNOSIS — M2011 Hallux valgus (acquired), right foot: Secondary | ICD-10-CM | POA: Diagnosis not present

## 2021-09-21 DIAGNOSIS — E1142 Type 2 diabetes mellitus with diabetic polyneuropathy: Secondary | ICD-10-CM

## 2021-09-21 DIAGNOSIS — M2012 Hallux valgus (acquired), left foot: Secondary | ICD-10-CM | POA: Diagnosis not present

## 2021-09-21 NOTE — Progress Notes (Signed)
SITUATION Reason for Visit: Fitting of Diabetic Shoes & Insoles Patient / Caregiver Report:  Patient reports comfort and is satisfied  OBJECTIVE DATA: Patient History / Diagnosis:     ICD-10-CM   1. Type 2 diabetes mellitus with polyneuropathy (HCC)  E11.42       Change in Status:   None  ACTIONS PERFORMED: In-Person Delivery, patient was fit with: - 1x pair [id number] PDAC approved prefabricated Diabetic Shoes: Orthofeet 521 13W - 3x pair [id number] PDAC approved CAM milled custom diabetic insoles  Shoes and insoles were verified for structural integrity and safety. Patient wore shoes and insoles in office. Skin was inspected and free of areas of concern after wearing shoes and inserts. Shoes and inserts fit properly. Patient / Caregiver provided with ferbal instruction and demonstration regarding donning, doffing, wear, care, proper fit, function, purpose, cleaning, and use of shoes and insoles ' and in all related precautions and risks and benefits regarding shoes and insoles. Patient / Caregiver was instructed to wear properly fitting socks with shoes at all times. Patient was also provided with verbal instruction regarding how to report any failures or malfunctions of shoes or inserts, and necessary follow up care. Patient / Caregiver was also instructed to contact physician regarding change in status that may affect function of shoes and inserts.   Patient / Caregiver verbalized undersatnding of instruction provided. Patient / Caregiver demonstrated independence with proper donning and doffing of shoes and inserts.  PLAN Patient to follow up as needed. Plan of care was discussed with and agreed upon by patient and/or caregiver. All questions were answered and concerns addressed.

## 2021-09-22 DIAGNOSIS — N2581 Secondary hyperparathyroidism of renal origin: Secondary | ICD-10-CM | POA: Diagnosis not present

## 2021-09-22 DIAGNOSIS — N186 End stage renal disease: Secondary | ICD-10-CM | POA: Diagnosis not present

## 2021-09-22 DIAGNOSIS — Z992 Dependence on renal dialysis: Secondary | ICD-10-CM | POA: Diagnosis not present

## 2021-09-24 DIAGNOSIS — N2581 Secondary hyperparathyroidism of renal origin: Secondary | ICD-10-CM | POA: Diagnosis not present

## 2021-09-24 DIAGNOSIS — H3582 Retinal ischemia: Secondary | ICD-10-CM | POA: Diagnosis not present

## 2021-09-24 DIAGNOSIS — H35371 Puckering of macula, right eye: Secondary | ICD-10-CM | POA: Diagnosis not present

## 2021-09-24 DIAGNOSIS — E113511 Type 2 diabetes mellitus with proliferative diabetic retinopathy with macular edema, right eye: Secondary | ICD-10-CM | POA: Diagnosis not present

## 2021-09-24 DIAGNOSIS — H31091 Other chorioretinal scars, right eye: Secondary | ICD-10-CM | POA: Diagnosis not present

## 2021-09-24 DIAGNOSIS — N186 End stage renal disease: Secondary | ICD-10-CM | POA: Diagnosis not present

## 2021-09-24 DIAGNOSIS — H338 Other retinal detachments: Secondary | ICD-10-CM | POA: Diagnosis not present

## 2021-09-24 DIAGNOSIS — Z992 Dependence on renal dialysis: Secondary | ICD-10-CM | POA: Diagnosis not present

## 2021-09-27 DIAGNOSIS — Z992 Dependence on renal dialysis: Secondary | ICD-10-CM | POA: Diagnosis not present

## 2021-09-27 DIAGNOSIS — N2581 Secondary hyperparathyroidism of renal origin: Secondary | ICD-10-CM | POA: Diagnosis not present

## 2021-09-27 DIAGNOSIS — N186 End stage renal disease: Secondary | ICD-10-CM | POA: Diagnosis not present

## 2021-09-29 DIAGNOSIS — Z992 Dependence on renal dialysis: Secondary | ICD-10-CM | POA: Diagnosis not present

## 2021-09-29 DIAGNOSIS — N186 End stage renal disease: Secondary | ICD-10-CM | POA: Diagnosis not present

## 2021-09-29 DIAGNOSIS — N2581 Secondary hyperparathyroidism of renal origin: Secondary | ICD-10-CM | POA: Diagnosis not present

## 2021-10-01 DIAGNOSIS — Z992 Dependence on renal dialysis: Secondary | ICD-10-CM | POA: Diagnosis not present

## 2021-10-01 DIAGNOSIS — N186 End stage renal disease: Secondary | ICD-10-CM | POA: Diagnosis not present

## 2021-10-01 DIAGNOSIS — N2581 Secondary hyperparathyroidism of renal origin: Secondary | ICD-10-CM | POA: Diagnosis not present

## 2021-10-02 DIAGNOSIS — N186 End stage renal disease: Secondary | ICD-10-CM | POA: Diagnosis not present

## 2021-10-02 DIAGNOSIS — E1129 Type 2 diabetes mellitus with other diabetic kidney complication: Secondary | ICD-10-CM | POA: Diagnosis not present

## 2021-10-02 DIAGNOSIS — Z992 Dependence on renal dialysis: Secondary | ICD-10-CM | POA: Diagnosis not present

## 2021-10-04 DIAGNOSIS — N2581 Secondary hyperparathyroidism of renal origin: Secondary | ICD-10-CM | POA: Diagnosis not present

## 2021-10-04 DIAGNOSIS — Z992 Dependence on renal dialysis: Secondary | ICD-10-CM | POA: Diagnosis not present

## 2021-10-04 DIAGNOSIS — N186 End stage renal disease: Secondary | ICD-10-CM | POA: Diagnosis not present

## 2021-10-06 DIAGNOSIS — Z992 Dependence on renal dialysis: Secondary | ICD-10-CM | POA: Diagnosis not present

## 2021-10-06 DIAGNOSIS — N186 End stage renal disease: Secondary | ICD-10-CM | POA: Diagnosis not present

## 2021-10-06 DIAGNOSIS — N2581 Secondary hyperparathyroidism of renal origin: Secondary | ICD-10-CM | POA: Diagnosis not present

## 2021-10-08 DIAGNOSIS — Z992 Dependence on renal dialysis: Secondary | ICD-10-CM | POA: Diagnosis not present

## 2021-10-08 DIAGNOSIS — N186 End stage renal disease: Secondary | ICD-10-CM | POA: Diagnosis not present

## 2021-10-08 DIAGNOSIS — N2581 Secondary hyperparathyroidism of renal origin: Secondary | ICD-10-CM | POA: Diagnosis not present

## 2021-10-11 DIAGNOSIS — N186 End stage renal disease: Secondary | ICD-10-CM | POA: Diagnosis not present

## 2021-10-11 DIAGNOSIS — Z992 Dependence on renal dialysis: Secondary | ICD-10-CM | POA: Diagnosis not present

## 2021-10-11 DIAGNOSIS — N2581 Secondary hyperparathyroidism of renal origin: Secondary | ICD-10-CM | POA: Diagnosis not present

## 2021-10-13 DIAGNOSIS — N186 End stage renal disease: Secondary | ICD-10-CM | POA: Diagnosis not present

## 2021-10-13 DIAGNOSIS — Z992 Dependence on renal dialysis: Secondary | ICD-10-CM | POA: Diagnosis not present

## 2021-10-13 DIAGNOSIS — N2581 Secondary hyperparathyroidism of renal origin: Secondary | ICD-10-CM | POA: Diagnosis not present

## 2021-10-15 DIAGNOSIS — N2581 Secondary hyperparathyroidism of renal origin: Secondary | ICD-10-CM | POA: Diagnosis not present

## 2021-10-15 DIAGNOSIS — Z992 Dependence on renal dialysis: Secondary | ICD-10-CM | POA: Diagnosis not present

## 2021-10-15 DIAGNOSIS — N186 End stage renal disease: Secondary | ICD-10-CM | POA: Diagnosis not present

## 2021-10-19 ENCOUNTER — Ambulatory Visit: Payer: Medicare HMO | Admitting: Family Medicine

## 2021-10-20 DIAGNOSIS — N186 End stage renal disease: Secondary | ICD-10-CM | POA: Diagnosis not present

## 2021-10-20 DIAGNOSIS — Z992 Dependence on renal dialysis: Secondary | ICD-10-CM | POA: Diagnosis not present

## 2021-10-20 DIAGNOSIS — N2581 Secondary hyperparathyroidism of renal origin: Secondary | ICD-10-CM | POA: Diagnosis not present

## 2021-10-22 ENCOUNTER — Emergency Department (HOSPITAL_COMMUNITY)
Admission: EM | Admit: 2021-10-22 | Discharge: 2021-10-22 | Disposition: A | Discharge status: Home / Self Care | Payer: Medicare HMO | Attending: Emergency Medicine | Admitting: Emergency Medicine

## 2021-10-22 ENCOUNTER — Encounter (HOSPITAL_COMMUNITY): Payer: Self-pay | Admitting: Emergency Medicine

## 2021-10-22 DIAGNOSIS — R000 Tachycardia, unspecified: Secondary | ICD-10-CM | POA: Diagnosis not present

## 2021-10-22 DIAGNOSIS — R1111 Vomiting without nausea: Secondary | ICD-10-CM | POA: Diagnosis not present

## 2021-10-22 DIAGNOSIS — R1084 Generalized abdominal pain: Secondary | ICD-10-CM | POA: Diagnosis not present

## 2021-10-22 LAB — COMPREHENSIVE METABOLIC PANEL
ALT: 23 U/L (ref 0–44)
AST: 25 U/L (ref 15–41)
Albumin: 4.3 g/dL (ref 3.5–5.0)
Alkaline Phosphatase: 63 U/L (ref 38–126)
Anion gap: 18 — ABNORMAL HIGH (ref 5–15)
BUN: 39 mg/dL — ABNORMAL HIGH (ref 6–20)
CO2: 24 mmol/L (ref 22–32)
Calcium: 9.9 mg/dL (ref 8.9–10.3)
Chloride: 99 mmol/L (ref 98–111)
Creatinine, Ser: 13.79 mg/dL — ABNORMAL HIGH (ref 0.61–1.24)
GFR, Estimated: 4 mL/min — ABNORMAL LOW (ref >60)
Glucose, Bld: 102 mg/dL — ABNORMAL HIGH (ref 70–99)
Potassium: 3.6 mmol/L (ref 3.5–5.1)
Sodium: 141 mmol/L (ref 135–145)
Total Bilirubin: 0.6 mg/dL (ref 0.3–1.2)
Total Protein: 7.6 g/dL (ref 6.5–8.1)

## 2021-10-22 LAB — CBC
HCT: 32.7 % — ABNORMAL LOW (ref 39.0–52.0)
Hemoglobin: 11 g/dL — ABNORMAL LOW (ref 13.0–17.0)
MCH: 35.3 pg — ABNORMAL HIGH (ref 26.0–34.0)
MCHC: 33.6 g/dL (ref 30.0–36.0)
MCV: 104.8 fL — ABNORMAL HIGH (ref 80.0–100.0)
Platelets: 173 10*3/uL (ref 150–400)
RBC: 3.12 MIL/uL — ABNORMAL LOW (ref 4.22–5.81)
RDW: 14.5 % (ref 11.5–15.5)
WBC: 5.5 10*3/uL (ref 4.0–10.5)
nRBC: 0 % (ref 0.0–0.2)

## 2021-10-22 LAB — LIPASE, BLOOD: Lipase: 53 U/L — ABNORMAL HIGH (ref 11–51)

## 2021-10-22 MED ORDER — DROPERIDOL 2.5 MG/ML IJ SOLN
1.2500 mg | Freq: Once | INTRAMUSCULAR | Status: AC
  Administered 2021-10-22: 1.25 mg via INTRAVENOUS
  Filled 2021-10-22: qty 2

## 2021-10-22 MED ORDER — HYDROMORPHONE HCL 1 MG/ML IJ SOLN
0.5000 mg | Freq: Once | INTRAMUSCULAR | Status: AC
  Administered 2021-10-22: 0.5 mg via INTRAVENOUS
  Filled 2021-10-22: qty 1

## 2021-10-22 MED ORDER — DIPHENHYDRAMINE HCL 50 MG/ML IJ SOLN
25.0000 mg | Freq: Once | INTRAMUSCULAR | Status: AC
  Administered 2021-10-22: 25 mg via INTRAVENOUS
  Filled 2021-10-22: qty 1

## 2021-10-22 NOTE — ED Notes (Signed)
Reviewed discharge instructions with patient. Follow-up care reviewed. Patient verbalized understanding. Patient A&Ox4, VSS, and ambulatory with steady gait upon discharge.  

## 2021-10-22 NOTE — ED Provider Notes (Signed)
[facility] EMERGENCY DEPARTMENT Provider Note   CSN: [id number] Arrival date & time: 10/22/21  6222     History  Chief Complaint  Patient presents with   Abdominal Pain    Edward MCQUISTON Sr. is a 51 y.o. male.  52 yo M with a chief complaints of diffuse abdominal discomfort.  Edward [name] feels like it is worse down towards the bottom of the abdomen.  This is an ongoing problem for him.  Going on for the better part of the year.  Edward [name] is not sure why Edward [name] keeps having abdominal discomfort.  Is seeing a specialist in the office without obvious etiology.  Edward [name] missed his most recent dialysis session.  Here for worsening pain.  The history is provided by the patient.  Abdominal Pain Pain location:  Generalized Pain quality: aching and cramping   Pain radiates to:  Does not radiate Pain severity:  Moderate Onset quality:  Gradual Duration:  2 days Timing:  Constant Progression:  Worsening Chronicity:  Chronic Associated symptoms: no chest pain, no chills, no diarrhea, no fever, no shortness of breath and no vomiting       Home Medications Prior to Admission medications   Medication Sig Start Date End Date Taking? Authorizing Provider  acetaminophen (TYLENOL) 500 MG tablet Take 500 mg by mouth every 6 (six) hours as needed for moderate pain or mild pain.   Yes [provider]  atorvastatin (LIPITOR) 40 MG tablet Take 1 tablet (40 mg total) by mouth daily. 07/27/21  Yes [name], MD  cinacalcet (SENSIPAR) 90 MG tablet Take 90 mg by mouth daily. 02/09/21  Yes [provider]  diclofenac Sodium (VOLTAREN) 1 % GEL Apply 4 g topically 4 (four) times daily. 05/13/21  Yes Gatha Mayer, MD  Doxercalciferol ([place] IV) Dialysis 06/14/21 06/13/22 Yes [provider]  furosemide (LASIX) 80 MG tablet Take 80 mg by mouth 2 (two) times daily. 09/02/20  Yes [provider]  heparin 10000 unit/mL SOLN Dialysis 06/18/21 06/17/22 Yes [provider]  levothyroxine (SYNTHROID) 137 MCG tablet TAKE 1 TABLET(137 MCG) BY MOUTH DAILY BEFORE BREAKFAST Patient taking differently: Take 137 mcg by mouth daily before breakfast. 08/21/20  Yes Philemon Kingdom, MD  Methoxy PEG-Epoetin Beta (MIRCERA IJ) Dialysis 02/10/21 02/09/22 Yes [provider]  ondansetron (ZOFRAN ODT) 4 MG disintegrating tablet 32m ODT q4 hours prn nausea/vomit Patient taking differently: Take 4 mg by mouth every 4 (four) hours as needed for vomiting or nausea. 07/19/21  Yes FJacqlyn Larsen PA-C  oxyCODONE (OXY IR/ROXICODONE) 5 MG immediate release tablet Take 1 tablet (5 mg total) by mouth every 4 (four) hours as needed for severe pain. 05/13/21  Yes GGatha Mayer MD  pantoprazole (PROTONIX) 40 MG tablet Take 1 tablet (40 mg total) by mouth 2 (two) times daily. 07/19/21  Yes FJacqlyn Larsen PA-C  promethazine (PHENERGAN) 25 MG tablet Take 1 tablet (25 mg total) by mouth every 6 (six) hours as needed for nausea or vomiting. 05/13/21  Yes GGatha Mayer MD  SENSIPAR 30 MG tablet Take 30 mg by mouth daily. 09/22/20  Yes [provider]  sevelamer carbonate (RENVELA) 800 MG tablet Take 1 tablet (800 mg total) by mouth 3 (three) times daily with meals. 05/29/20  Yes AMilus BanisterC, DO  sildenafil (VIAGRA) 50 MG tablet TAKE 2 TABLETS BY MOUTH DAILY AS NEEDED FOR ERECTILE DYSFUNCTION. AT LEAST 24 HOURS BETWEEN DOSES Patient taking differently: Take 100 mg by  mouth as needed for erectile dysfunction. 06/03/21  Yes [name], MD  VELPHORO 500 MG chewable tablet Chew 1,000 mg by mouth 3 (three) times daily. 10/08/21  Yes [provider]  Accu-Chek Softclix Lancets lancets Use as instructed 08/21/20   Philemon Kingdom, MD  Alcohol Swabs (B-D SINGLE USE SWABS REGULAR) PADS Use as directed. 01/09/20   [name], MD  Blood Glucose Monitoring Suppl (ACCU-CHEK GUIDE ME) w/Device KIT Use 3 times daily before meals 08/21/20   Philemon Kingdom, MD   glucose blood (ACCU-CHEK GUIDE) test strip Use as instructed 08/21/20   Philemon Kingdom, MD  IRON SUCROSE IV Iron Sucrose (Venofer) 02/24/21 03/17/21  [provider]      Allergies    Patient has no known allergies.    Review of Systems   Review of Systems  Constitutional:  Negative for chills and fever.  HENT:  Negative for congestion and facial swelling.   Eyes:  Negative for discharge and visual disturbance.  Respiratory:  Negative for shortness of breath.   Cardiovascular:  Negative for chest pain and palpitations.  Gastrointestinal:  Positive for abdominal pain. Negative for diarrhea and vomiting.  Musculoskeletal:  Negative for arthralgias and myalgias.  Skin:  Negative for color change and rash.  Neurological:  Negative for tremors, syncope and headaches.  Psychiatric/Behavioral:  Negative for confusion and dysphoric mood.    Physical Exam Updated Vital Signs BP (!) 161/88    Pulse 87    Temp 98.5 F (36.9 C) (Oral)    Resp 20    SpO2 96%  Physical Exam Vitals and nursing note reviewed.  Constitutional:      Appearance: Edward [name] is well-developed.  HENT:     Head: Normocephalic and atraumatic.  Eyes:     Pupils: Pupils are equal, round, and reactive to light.  Neck:     Vascular: No JVD.  Cardiovascular:     Rate and Rhythm: Normal rate and regular rhythm.     Heart sounds: No murmur heard.   No friction rub. No gallop.  Pulmonary:     Effort: No respiratory distress.     Breath sounds: No wheezing.  Abdominal:     General: There is no distension.     Tenderness: There is no abdominal tenderness. There is no guarding or rebound.     Comments: No obvious tenderness on abdominal exam  Musculoskeletal:        General: Normal range of motion.     Cervical back: Normal range of motion and neck supple.  Skin:    Coloration: Skin is not pale.     Findings: No rash.  Neurological:     Mental Status: Edward [name] is alert and oriented to person, place, and time.   Psychiatric:        Behavior: Behavior normal.    ED Results / Procedures / Treatments   Labs (all labs ordered are listed, but only abnormal results are displayed) Labs Reviewed  LIPASE, BLOOD - Abnormal; Notable for the following components:      Result Value   Lipase 53 (*)    All other components within normal limits  COMPREHENSIVE METABOLIC PANEL - Abnormal; Notable for the following components:   Glucose, Bld 102 (*)    BUN 39 (*)    Creatinine, Ser 13.79 (*)    GFR, Estimated 4 (*)    Anion gap 18 (*)    All other components within normal limits  CBC - Abnormal; Notable for the following  components:   RBC 3.12 (*)    Hemoglobin 11.0 (*)    HCT 32.7 (*)    MCV 104.8 (*)    MCH 35.3 (*)    All other components within normal limits  URINALYSIS, ROUTINE W REFLEX MICROSCOPIC    EKG None  Radiology No results found.  Procedures Procedures    Medications Ordered in ED Medications  droperidol (INAPSINE) 2.5 MG/ML injection 1.25 mg (1.25 mg Intravenous Given 10/22/21 1210)  HYDROmorphone (DILAUDID) injection 0.5 mg (0.5 mg Intravenous Given 10/22/21 1210)  diphenhydrAMINE (BENADRYL) injection 25 mg (25 mg Intravenous Given 10/22/21 1210)    ED Course/ Medical Decision Making/ A&P                           Medical Decision Making Risk Prescription drug management.   51 yo M with a chief complaints of diffuse colicky abdominal discomfort.  The patient has had this problem off and on and has been seen multiple times in this ER for the same.  On my initial exam the patient was asleep and had no abdominal discomfort.  Upon awaking the patient Edward [name] began screaming and writhing around and shaking and telling me that something was wrong.  We will obtain a laboratory evaluation.  Treat pain and nausea.  Reassess.  Patient feeling better on reassessment.  Lipase is trivially elevated.  No LFT elevation.  BUN of 39 and seems unlikely to make him symptomatic.  Potassium 3.6.   We will discharge the patient home.  1:16 PM:  I have discussed the diagnosis/risks/treatment options with the patient and believe the pt to be eligible for discharge home to follow-up with PCP, nephrology. We also discussed returning to the ED immediately if new or worsening sx occur. We discussed the sx which are most concerning (e.g., sudden worsening pain, fever, inability to tolerate by mouth) that necessitate immediate return. Medications administered to the patient during their visit and any new prescriptions provided to the patient are listed below.  Medications given during this visit Medications  droperidol (INAPSINE) 2.5 MG/ML injection 1.25 mg (1.25 mg Intravenous Given 10/22/21 1210)  HYDROmorphone (DILAUDID) injection 0.5 mg (0.5 mg Intravenous Given 10/22/21 1210)  diphenhydrAMINE (BENADRYL) injection 25 mg (25 mg Intravenous Given 10/22/21 1210)     The patient appears reasonably screen and/or stabilized for discharge and I doubt any other medical condition or other [facility] requiring further screening, evaluation, or treatment in the ED at this time prior to discharge.          Final Clinical Impression(s) / ED Diagnoses Final diagnoses:  Generalized abdominal pain    Rx / DC Orders ED Discharge Orders     None         Deno Etienne, DO 10/22/21 1316

## 2021-10-22 NOTE — ED Notes (Signed)
Pt resting comfortably upon arrival to room. Pt then opened his eyes out and started thrashing in bed and moaning.

## 2021-10-22 NOTE — ED Notes (Signed)
Pt w/ intermittent panic attack like presentation hyperventilating, and thrashing around in the bed. Pt then goes limp in the bed like he is less responsive, but alert to gentle touch to arm or chest and speech. Then pt starts thrashing around in bed and having previously described episode again. MD aware.

## 2021-10-22 NOTE — ED Triage Notes (Signed)
Patient BIB [facility] abdominal pain started last night. Missed dialysis Monday, went Wednesday, missed today; right arm restriction for dialysis access. Reports nausea and vomiting at home, patient alert, oriented, and in no apparent distress at this time.  EMS Vitals 151/95 HR 90 CBG 118

## 2021-10-22 NOTE — Discharge Instructions (Signed)
Call your dialysis center.  Follow-up with your family doctor.  Return for worsening symptoms.

## 2021-10-22 NOTE — ED Provider Triage Note (Signed)
Emergency Medicine Provider Triage Evaluation Note  Edward [name] , a 51 y.o. male  was evaluated in triage.  Pt complains of abdominal pain for several days. He describes it as "sometimes sharp, sometimes dull". He reports it is "all over". He is a dialysis MWF patient, went Monday but skipped Wednesday and Friday.   Review of Systems  Positive: Abdominal pain, nausea, vomiting Negative: Diarrhea, constipation, fevers  Physical Exam  BP (!) 151/91 (BP Location: Left Arm)    Pulse 85    Temp 98.5 F (36.9 C) (Oral)    Resp (!) 22    SpO2 100%  Gen:   Awake, no distress   Resp:  Normal effort  MSK:   Moves extremities without difficulty  Other:  Intermittently screaming and sleeping while in triage, has to be aroused to answer questions  Medical Decision Making  Medically screening exam initiated at 9:57 AM.  Appropriate orders placed.  Edward [name] was informed that the remainder of the evaluation will be completed by another provider, this initial triage assessment does not replace that evaluation, and the importance of remaining in the ED until their evaluation is complete.     Kateri Plummer, [place] 10/22/21 [phone]

## 2021-10-25 ENCOUNTER — Telehealth: Payer: Self-pay | Admitting: Internal Medicine

## 2021-10-25 ENCOUNTER — Other Ambulatory Visit: Payer: Self-pay | Admitting: Family Medicine

## 2021-10-25 DIAGNOSIS — N186 End stage renal disease: Secondary | ICD-10-CM | POA: Diagnosis not present

## 2021-10-25 DIAGNOSIS — Z992 Dependence on renal dialysis: Secondary | ICD-10-CM | POA: Diagnosis not present

## 2021-10-25 DIAGNOSIS — N2581 Secondary hyperparathyroidism of renal origin: Secondary | ICD-10-CM | POA: Diagnosis not present

## 2021-10-25 NOTE — Telephone Encounter (Signed)
Not seen since Sept 22 I think he needs to see a pain specialist for chronic abdominal pain - he can ask PCP for help with this  I will not re-Rx narcotics

## 2021-10-25 NOTE — Telephone Encounter (Signed)
Copied from [place] [id number]. Topic: Quick Communication - Rx Refill/Question >> Oct 25, 2021  1:23 PM Yvette Rack wrote: Medication: oxyCODONE (OXY IR/ROXICODONE) 5 MG immediate release tablet  Has the patient contacted their pharmacy? No. Controlled substance (Agent: If no, request that the patient contact the pharmacy for the refill. If patient does not wish to contact the pharmacy document the reason why and proceed with request.) (Agent: If yes, when and what did the pharmacy advise?)  Preferred Pharmacy (with phone number or street name): [facility] [phone] - [place], [address]  Phone: [phone] Fax: [phone]  Has the patient been seen for an appointment in the last year OR does the patient have an upcoming appointment? Yes.    Agent: Please be advised that RX refills may take up to 3 business days. We ask that you follow-up with your pharmacy.

## 2021-10-25 NOTE — Telephone Encounter (Signed)
Requested medications are due for refill today.  yes  Requested medications are on the active medications list.  yes  Last refill. 05/13/2021  Future visit scheduled.   yes  Notes to clinic.  Medication not delegated.    Requested Prescriptions  Pending Prescriptions Disp Refills   oxyCODONE (OXY IR/ROXICODONE) 5 MG immediate release tablet 15 tablet 0    Sig: Take 1 tablet (5 mg total) by mouth every 4 (four) hours as needed for severe pain.     Not Delegated - Analgesics:  Opioid Agonists Failed - 10/25/2021  4:56 PM      Failed - This refill cannot be delegated      Failed - Urine Drug Screen completed in last 360 days      Passed - Valid encounter within last 6 months    Recent Outpatient Visits           3 months ago Encounter for [employer] annual wellness exam   [place], [place], MD   1 year ago Type 2 diabetes mellitus with chronic kidney disease on chronic dialysis, without long-term current use of insulin ([place])   [place], Charlane Ferretti, MD   1 year ago Left foot pain   [place], [place] W, NP   1 year ago Type 2 diabetes mellitus with stage 4 chronic kidney disease, without long-term current use of insulin ([place])   [place], Charlane Ferretti, MD   1 year ago Type 2 diabetes mellitus with stage 4 chronic kidney disease, without long-term current use of insulin ([place])   [place], MD       Future Appointments             In 1 week [place], Dionne Bucy, PA-C [place]

## 2021-10-25 NOTE — Telephone Encounter (Signed)
Pt made aware of Dr. Gessner recommendations: Pt verbalized understanding with all questions answered.   

## 2021-10-25 NOTE — Telephone Encounter (Signed)
Patient called states he is in a lot of pain and was just at the ED. Patient is requesting a refill on Oxycodone medication to help with his pain.

## 2021-10-25 NOTE — Telephone Encounter (Signed)
Pt states that he has been in and out of the ED.  Last Visit 10/22/2021. Pt state that he had to leave dialysis early today due to the abdominal pain. Pt states that he has experienced abdominal pain along with  N/V on and off for three years. Last BM three days ago. Pt stated that he did have a large amount of emesis the other day as well. Pt requesting something for Abdominal Pain.  Please advise

## 2021-10-27 DIAGNOSIS — N186 End stage renal disease: Secondary | ICD-10-CM | POA: Diagnosis not present

## 2021-10-27 DIAGNOSIS — Z992 Dependence on renal dialysis: Secondary | ICD-10-CM | POA: Diagnosis not present

## 2021-10-27 DIAGNOSIS — N2581 Secondary hyperparathyroidism of renal origin: Secondary | ICD-10-CM | POA: Diagnosis not present

## 2021-10-28 DIAGNOSIS — N186 End stage renal disease: Secondary | ICD-10-CM | POA: Diagnosis not present

## 2021-10-29 DIAGNOSIS — Z992 Dependence on renal dialysis: Secondary | ICD-10-CM | POA: Diagnosis not present

## 2021-10-29 DIAGNOSIS — N186 End stage renal disease: Secondary | ICD-10-CM | POA: Diagnosis not present

## 2021-10-29 DIAGNOSIS — N2581 Secondary hyperparathyroidism of renal origin: Secondary | ICD-10-CM | POA: Diagnosis not present

## 2021-11-01 DIAGNOSIS — N186 End stage renal disease: Secondary | ICD-10-CM | POA: Diagnosis not present

## 2021-11-01 DIAGNOSIS — N2581 Secondary hyperparathyroidism of renal origin: Secondary | ICD-10-CM | POA: Diagnosis not present

## 2021-11-01 DIAGNOSIS — Z992 Dependence on renal dialysis: Secondary | ICD-10-CM | POA: Diagnosis not present

## 2021-11-01 DIAGNOSIS — R6889 Other general symptoms and signs: Secondary | ICD-10-CM | POA: Diagnosis not present

## 2021-11-02 ENCOUNTER — Ambulatory Visit: Payer: Medicare HMO | Admitting: Podiatry

## 2021-11-02 DIAGNOSIS — E1129 Type 2 diabetes mellitus with other diabetic kidney complication: Secondary | ICD-10-CM | POA: Diagnosis not present

## 2021-11-02 DIAGNOSIS — N186 End stage renal disease: Secondary | ICD-10-CM | POA: Diagnosis not present

## 2021-11-02 DIAGNOSIS — Z992 Dependence on renal dialysis: Secondary | ICD-10-CM | POA: Diagnosis not present

## 2021-11-03 DIAGNOSIS — R6889 Other general symptoms and signs: Secondary | ICD-10-CM | POA: Diagnosis not present

## 2021-11-03 DIAGNOSIS — N186 End stage renal disease: Secondary | ICD-10-CM | POA: Diagnosis not present

## 2021-11-03 DIAGNOSIS — N2581 Secondary hyperparathyroidism of renal origin: Secondary | ICD-10-CM | POA: Diagnosis not present

## 2021-11-03 DIAGNOSIS — Z992 Dependence on renal dialysis: Secondary | ICD-10-CM | POA: Diagnosis not present

## 2021-11-04 ENCOUNTER — Ambulatory Visit: Payer: Medicare HMO | Admitting: Physician Assistant

## 2021-11-04 NOTE — Progress Notes (Deleted)
Patient ID: Edward [name], male   DOB: [birth year], 51 y.o.   MRN: [id number]  ED for abdominal pain 10/22/2021  From ED A/P:  51 yo M with a chief complaints of diffuse colicky abdominal discomfort.  The patient has had this problem off and on and has been seen multiple times in this ER for the same.  On my initial exam the patient was asleep and had no abdominal discomfort.  Upon awaking the patient he began screaming and writhing around and shaking and telling me that something was wrong.  We will obtain a laboratory evaluation.  Treat pain and nausea.  Reassess.   Patient feeling better on reassessment.  Lipase is trivially elevated.  No LFT elevation.  BUN of 39 and seems unlikely to make him symptomatic.  Potassium 3.6.  We will discharge the patient home.   1:16 PM:  I have discussed the diagnosis/risks/treatment options with the patient and believe the pt to be eligible for discharge home to follow-up with PCP, nephrology. We also discussed returning to the ED immediately if new or worsening sx occur. We discussed the sx which are most concerning (e.g., sudden worsening pain, fever, inability to tolerate by mouth) that necessitate immediate return. Medications administered to the patient during their visit and any new prescriptions provided to the patient are listed below.

## 2021-11-05 DIAGNOSIS — N2581 Secondary hyperparathyroidism of renal origin: Secondary | ICD-10-CM | POA: Diagnosis not present

## 2021-11-05 DIAGNOSIS — Z992 Dependence on renal dialysis: Secondary | ICD-10-CM | POA: Diagnosis not present

## 2021-11-05 DIAGNOSIS — R6889 Other general symptoms and signs: Secondary | ICD-10-CM | POA: Diagnosis not present

## 2021-11-05 DIAGNOSIS — N186 End stage renal disease: Secondary | ICD-10-CM | POA: Diagnosis not present

## 2021-11-08 DIAGNOSIS — Z992 Dependence on renal dialysis: Secondary | ICD-10-CM | POA: Diagnosis not present

## 2021-11-08 DIAGNOSIS — N2581 Secondary hyperparathyroidism of renal origin: Secondary | ICD-10-CM | POA: Diagnosis not present

## 2021-11-08 DIAGNOSIS — R6889 Other general symptoms and signs: Secondary | ICD-10-CM | POA: Diagnosis not present

## 2021-11-08 DIAGNOSIS — N186 End stage renal disease: Secondary | ICD-10-CM | POA: Diagnosis not present

## 2021-11-10 DIAGNOSIS — Z992 Dependence on renal dialysis: Secondary | ICD-10-CM | POA: Diagnosis not present

## 2021-11-10 DIAGNOSIS — N2581 Secondary hyperparathyroidism of renal origin: Secondary | ICD-10-CM | POA: Diagnosis not present

## 2021-11-10 DIAGNOSIS — R6889 Other general symptoms and signs: Secondary | ICD-10-CM | POA: Diagnosis not present

## 2021-11-10 DIAGNOSIS — N186 End stage renal disease: Secondary | ICD-10-CM | POA: Diagnosis not present

## 2021-11-12 DIAGNOSIS — Z992 Dependence on renal dialysis: Secondary | ICD-10-CM | POA: Diagnosis not present

## 2021-11-12 DIAGNOSIS — N186 End stage renal disease: Secondary | ICD-10-CM | POA: Diagnosis not present

## 2021-11-12 DIAGNOSIS — R6889 Other general symptoms and signs: Secondary | ICD-10-CM | POA: Diagnosis not present

## 2021-11-12 DIAGNOSIS — N2581 Secondary hyperparathyroidism of renal origin: Secondary | ICD-10-CM | POA: Diagnosis not present

## 2021-11-15 DIAGNOSIS — N2581 Secondary hyperparathyroidism of renal origin: Secondary | ICD-10-CM | POA: Diagnosis not present

## 2021-11-15 DIAGNOSIS — N186 End stage renal disease: Secondary | ICD-10-CM | POA: Diagnosis not present

## 2021-11-15 DIAGNOSIS — Z992 Dependence on renal dialysis: Secondary | ICD-10-CM | POA: Diagnosis not present

## 2021-11-15 DIAGNOSIS — R6889 Other general symptoms and signs: Secondary | ICD-10-CM | POA: Diagnosis not present

## 2021-11-17 DIAGNOSIS — Z992 Dependence on renal dialysis: Secondary | ICD-10-CM | POA: Diagnosis not present

## 2021-11-17 DIAGNOSIS — N186 End stage renal disease: Secondary | ICD-10-CM | POA: Diagnosis not present

## 2021-11-17 DIAGNOSIS — R6889 Other general symptoms and signs: Secondary | ICD-10-CM | POA: Diagnosis not present

## 2021-11-17 DIAGNOSIS — N2581 Secondary hyperparathyroidism of renal origin: Secondary | ICD-10-CM | POA: Diagnosis not present

## 2021-11-19 DIAGNOSIS — N2581 Secondary hyperparathyroidism of renal origin: Secondary | ICD-10-CM | POA: Diagnosis not present

## 2021-11-19 DIAGNOSIS — R6889 Other general symptoms and signs: Secondary | ICD-10-CM | POA: Diagnosis not present

## 2021-11-19 DIAGNOSIS — N186 End stage renal disease: Secondary | ICD-10-CM | POA: Diagnosis not present

## 2021-11-19 DIAGNOSIS — Z992 Dependence on renal dialysis: Secondary | ICD-10-CM | POA: Diagnosis not present

## 2021-11-22 DIAGNOSIS — N2581 Secondary hyperparathyroidism of renal origin: Secondary | ICD-10-CM | POA: Diagnosis not present

## 2021-11-22 DIAGNOSIS — Z992 Dependence on renal dialysis: Secondary | ICD-10-CM | POA: Diagnosis not present

## 2021-11-22 DIAGNOSIS — R6889 Other general symptoms and signs: Secondary | ICD-10-CM | POA: Diagnosis not present

## 2021-11-22 DIAGNOSIS — N186 End stage renal disease: Secondary | ICD-10-CM | POA: Diagnosis not present

## 2021-11-24 DIAGNOSIS — N186 End stage renal disease: Secondary | ICD-10-CM | POA: Diagnosis not present

## 2021-11-24 DIAGNOSIS — N2581 Secondary hyperparathyroidism of renal origin: Secondary | ICD-10-CM | POA: Diagnosis not present

## 2021-11-24 DIAGNOSIS — Z992 Dependence on renal dialysis: Secondary | ICD-10-CM | POA: Diagnosis not present

## 2021-11-24 DIAGNOSIS — R6889 Other general symptoms and signs: Secondary | ICD-10-CM | POA: Diagnosis not present

## 2021-11-26 DIAGNOSIS — R6889 Other general symptoms and signs: Secondary | ICD-10-CM | POA: Diagnosis not present

## 2021-11-26 DIAGNOSIS — N186 End stage renal disease: Secondary | ICD-10-CM | POA: Diagnosis not present

## 2021-11-26 DIAGNOSIS — Z992 Dependence on renal dialysis: Secondary | ICD-10-CM | POA: Diagnosis not present

## 2021-11-26 DIAGNOSIS — N2581 Secondary hyperparathyroidism of renal origin: Secondary | ICD-10-CM | POA: Diagnosis not present

## 2021-11-30 ENCOUNTER — Ambulatory Visit: Payer: Medicare HMO | Admitting: Podiatry

## 2021-11-30 DIAGNOSIS — N2581 Secondary hyperparathyroidism of renal origin: Secondary | ICD-10-CM | POA: Diagnosis not present

## 2021-11-30 DIAGNOSIS — N186 End stage renal disease: Secondary | ICD-10-CM | POA: Diagnosis not present

## 2021-11-30 DIAGNOSIS — Z992 Dependence on renal dialysis: Secondary | ICD-10-CM | POA: Diagnosis not present

## 2021-11-30 DIAGNOSIS — E1129 Type 2 diabetes mellitus with other diabetic kidney complication: Secondary | ICD-10-CM | POA: Diagnosis not present

## 2021-12-01 DIAGNOSIS — N186 End stage renal disease: Secondary | ICD-10-CM | POA: Diagnosis not present

## 2021-12-01 DIAGNOSIS — Z992 Dependence on renal dialysis: Secondary | ICD-10-CM | POA: Diagnosis not present

## 2021-12-01 DIAGNOSIS — N2581 Secondary hyperparathyroidism of renal origin: Secondary | ICD-10-CM | POA: Diagnosis not present

## 2021-12-02 ENCOUNTER — Encounter: Payer: Self-pay | Admitting: Physician Assistant

## 2021-12-02 ENCOUNTER — Ambulatory Visit: Payer: Medicare HMO | Attending: Family Medicine | Admitting: Physician Assistant

## 2021-12-02 VITALS — BP 118/70 | HR 73 | Wt 183.6 lb

## 2021-12-02 DIAGNOSIS — Z91199 Patient's noncompliance with other medical treatment and regimen due to unspecified reason: Secondary | ICD-10-CM

## 2021-12-02 DIAGNOSIS — E1122 Type 2 diabetes mellitus with diabetic chronic kidney disease: Secondary | ICD-10-CM

## 2021-12-02 DIAGNOSIS — E0500 Thyrotoxicosis with diffuse goiter without thyrotoxic crisis or storm: Secondary | ICD-10-CM | POA: Diagnosis not present

## 2021-12-02 DIAGNOSIS — Z992 Dependence on renal dialysis: Secondary | ICD-10-CM

## 2021-12-02 DIAGNOSIS — N529 Male erectile dysfunction, unspecified: Secondary | ICD-10-CM | POA: Diagnosis not present

## 2021-12-02 DIAGNOSIS — R600 Localized edema: Secondary | ICD-10-CM

## 2021-12-02 DIAGNOSIS — N186 End stage renal disease: Secondary | ICD-10-CM | POA: Diagnosis not present

## 2021-12-02 DIAGNOSIS — R413 Other amnesia: Secondary | ICD-10-CM | POA: Diagnosis not present

## 2021-12-02 LAB — GLUCOSE, POCT (MANUAL RESULT ENTRY): POC Glucose: 79 mg/dl (ref 70–99)

## 2021-12-02 LAB — POCT GLYCOSYLATED HEMOGLOBIN (HGB A1C): HbA1c, POC (controlled diabetic range): 4.6 % (ref 0.0–7.0)

## 2021-12-02 MED ORDER — FUROSEMIDE 80 MG PO TABS: 80.0000 mg | ORAL_TABLET | Freq: Two times a day (BID) | ORAL | 2 refills | Status: AC

## 2021-12-02 MED ORDER — PANTOPRAZOLE SODIUM 40 MG PO TBEC: 40.0000 mg | DELAYED_RELEASE_TABLET | Freq: Every day | ORAL | 2 refills | Status: AC

## 2021-12-02 MED ORDER — LEVOTHYROXINE SODIUM 137 MCG PO TABS: 137.0000 ug | ORAL_TABLET | Freq: Every day | ORAL | 2 refills | Status: DC

## 2021-12-02 MED ORDER — ACETAMINOPHEN 500 MG PO TABS: 500.0000 mg | ORAL_TABLET | Freq: Four times a day (QID) | ORAL | 1 refills | Status: DC | PRN

## 2021-12-02 MED ORDER — SILDENAFIL CITRATE 50 MG PO TABS: 100.0000 mg | ORAL_TABLET | Freq: Every day | ORAL | 2 refills | Status: DC | PRN

## 2021-12-02 NOTE — Progress Notes (Signed)
Patient ID: Edward [name], male   DOB: 08-13-1971, 51 y.o.   MRN: [id number] ? ? ?Epic Tribbett, is a 51 y.o. male ? ?[id number] ? ?[id number] ? ?DOB - September 13, 1971 ? ?No chief complaint on file. ?    ? ?Subjective:  ? ?Edward [name] is a 51 y.o. male here today for med RF.  He has not been taking any of his medications.  He smokes a lot of marijuana.  ESRD with dialysis m,W,F.  He is concerned about memory issues and low energy but has not been taking his thyroid meds either.  He is requesting oxycodone as well as RF on his other meds so her can start taking them again.  On transplant list ? ?No problems updated. ? ?ALLERGIES: ?No Known Allergies ? ?PAST MEDICAL HISTORY: ?Past Medical History:  ?Diagnosis Date  ? Acute on chronic renal failure ([place]) 11/25/2015  ? Anemia   ? Arthritis   ? 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 Cataract   ? CHF (congestive heart failure) ([place])   ? Chronic kidney disease   ? stage 5  ? COVID-19   ? Depression   ? situatuional  ? Diabetes mellitus   ? Type II  ? GERD (gastroesophageal reflux disease)   ? Graves disease 2014  ? Headache   ? in past  ? Hx of adenomatous and sessile serrated colonic polyps 11/21/2019  ? Hyperlipidemia   ? Hypertension   ? Hypothyroidism   ? 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 Neuropathy   ? Non-compliance   ? Proteinuria 05/24/2020  ? Shortness of breath dyspnea   ? "Better since I've been taking my medications"  ? ? ?MEDICATIONS AT HOME: ?Prior to Admission medications   ?Medication Sig Start Date End Date Taking? Authorizing Provider  ?Accu-Chek Softclix Lancets lancets Use as instructed 08/21/20   Philemon Kingdom, MD  ?acetaminophen (TYLENOL) 500 MG tablet Take 1 tablet (500 mg total) by mouth every 6 (six) hours as needed for moderate pain or mild pain. 12/02/21   Argentina Donovan, PA-C  ?Alcohol Swabs (B-D SINGLE USE SWABS REGULAR) PADS Use as directed. 01/09/20   [name], MD  ?atorvastatin (LIPITOR) 40 MG tablet Take 1 tablet (40 mg total) by  mouth daily. 07/27/21   [name], MD  ?Blood Glucose Monitoring Suppl (ACCU-CHEK GUIDE ME) w/Device KIT Use 3 times daily before meals 08/21/20   Philemon Kingdom, MD  ?cinacalcet (SENSIPAR) 90 MG tablet Take 90 mg by mouth daily. 02/09/21   [provider]  ?diclofenac Sodium (VOLTAREN) 1 % GEL Apply 4 g topically 4 (four) times daily. 05/13/21   Gatha Mayer, MD  ?Doxercalciferol ([place] IV) Dialysis 06/14/21 06/13/22  [provider]  ?furosemide (LASIX) 80 MG tablet Take 1 tablet (80 mg total) by mouth 2 (two) times daily. On the days you DO NOT have dialysis 12/02/21   Argentina Donovan, PA-C  ?glucose blood (ACCU-CHEK GUIDE) test strip Use as instructed 08/21/20   Philemon Kingdom, MD  ?heparin 10000 unit/mL SOLN Dialysis 06/18/21 06/17/22  [provider]  ?IRON SUCROSE IV Iron Sucrose (Venofer) 02/24/21 03/17/21  [provider]  ?levothyroxine (SYNTHROID) 137 MCG tablet Take 1 tablet (137 mcg total) by mouth daily before breakfast. 12/02/21   Argentina Donovan, PA-C  ?Methoxy PEG-Epoetin Beta (MIRCERA IJ) Dialysis 02/10/21 02/09/22  [provider]  ?ondansetron (ZOFRAN ODT) 4 MG disintegrating tablet 60m ODT q4 hours prn nausea/vomit ?Patient taking differently:  Take 4 mg by mouth every 4 (four) hours as needed for vomiting or nausea. 07/19/21   Jacqlyn Larsen, PA-C  ?oxyCODONE (OXY IR/ROXICODONE) 5 MG immediate release tablet Take 1 tablet (5 mg total) by mouth every 4 (four) hours as needed for severe pain. 05/13/21   Gatha Mayer, MD  ?pantoprazole (PROTONIX) 40 MG tablet Take 1 tablet (40 mg total) by mouth daily. 12/02/21   Argentina Donovan, PA-C  ?promethazine (PHENERGAN) 25 MG tablet Take 1 tablet (25 mg total) by mouth every 6 (six) hours as needed for nausea or vomiting. 05/13/21   Gatha Mayer, MD  ?SENSIPAR 30 MG tablet Take 30 mg by mouth daily. 09/22/20   [provider]  ?sevelamer carbonate (RENVELA) 800 MG tablet Take 1 tablet (800 mg  total) by mouth 3 (three) times daily with meals. 05/29/20   Daisy Floro, DO  ?sildenafil (VIAGRA) 50 MG tablet Take 2 tablets (100 mg total) by mouth daily as needed for erectile dysfunction. 12/02/21   Argentina Donovan, PA-C  ?VELPHORO 500 MG chewable tablet Chew 1,000 mg by mouth 3 (three) times daily. 10/08/21   [provider]  ? ? ?ROS: ?Neg HEENT ?Neg resp ?Neg cardiac ?Neg GU ?Neg MS ? ?Objective:  ? ?Vitals:  ? 12/02/21 1023  ?BP: 118/70  ?Pulse: 73  ?SpO2: 97%  ?Weight: 183 lb 9.6 oz (83.3 kg)  ? ?Exam ?General appearance : Awake, alert, not in any distress. Speech Clear. Not toxic looking.  Poor historian/difficult to follow.  Tangential thoughts ?HEENT: Atraumatic and Normocephalic ?Neck: Supple, no JVD. No cervical lymphadenopathy.  ?Chest: Good air entry bilaterally, CTAB.  No rales/rhonchi/wheezing ?CVS: S1 S2 regular, no murmurs.  ?Extremities: B/L Lower Ext shows no edema, both legs are warm to touch ?Neurology: Awake alert, and oriented X 3, CN II-XII intact, Non focal ?Skin: No Rash ? ?Data Review ?Lab Results  ?Component Value Date  ? HGBA1C 4.6 12/02/2021  ? HGBA1C 7.0 (A) 08/21/2020  ? HGBA1C 6.7 (H) 05/24/2020  ? ? ?Assessment & Plan  ? ?1. Type 2 diabetes mellitus with chronic kidney disease on chronic dialysis, without long-term current use of insulin ([place]) ?Well controlled-not taking meds ?- Glucose (CBG) ?- HgB A1c ? ?2. Graves disease ?He just started taking some of an old prescription a few days ago-will check levels at next visit ?- levothyroxine (SYNTHROID) 137 MCG tablet; Take 1 tablet (137 mcg total) by mouth daily before breakfast.  Dispense: 30 tablet; Refill: 2 ? ?3. Erectile dysfunction, unspecified erectile dysfunction type ?- sildenafil (VIAGRA) 50 MG tablet; Take 2 tablets (100 mg total) by mouth daily as needed for erectile dysfunction.  Dispense: 10 tablet; Refill: 2 ? ?4. Non compliance with medical treatment ?Encouraged compliance ? ?5. ESRD (end stage renal  disease) on dialysis [facility]) ?Continue dialysis.  Labs from about 6 weeks ago reviewed ? furosemide (LASIX) 80 MG tablet; Take 1 tablet (80 mg total) by mouth 2 (two) times daily. On the days you DO NOT have dialysis  Dispense: 34 tablet; Refill: 2 ? ?6. Pedal edema ?Not taking meds ?- furosemide (LASIX) 80 MG tablet; Take 1 tablet (80 mg total) by mouth 2 (two) times daily. On the days you DO NOT have dialysis  Dispense: 34 tablet; Refill: 2 ? ?7. Memory loss ?- Ambulatory referral to Neurology ? ? ? ?Patient have been counseled extensively about nutrition and exercise. Other issues discussed during this visit include: low cholesterol diet, weight control and  daily exercise, foot care, annual eye examinations at Ophthalmology, importance of adherence with medications and regular follow-up. We also discussed long term complications of uncontrolled diabetes and hypertension.  ? ?Return in about 7 weeks (around 01/20/2022) for with Dr Margarita Rana to check thyroid . ? ?The patient was given clear instructions to go to ER or return to medical center if symptoms don't improve, worsen or new problems develop. The patient verbalized understanding. The patient was told to call to get lab results if they haven't heard anything in the next week.  ? ? ? ? ?Freeman Caldron, PA-C ?[place] ?[place], [place] ?[phone]   ?12/02/2021, 11:53 AM  ?

## 2021-12-03 DIAGNOSIS — N2581 Secondary hyperparathyroidism of renal origin: Secondary | ICD-10-CM | POA: Diagnosis not present

## 2021-12-03 DIAGNOSIS — R6889 Other general symptoms and signs: Secondary | ICD-10-CM | POA: Diagnosis not present

## 2021-12-03 DIAGNOSIS — N186 End stage renal disease: Secondary | ICD-10-CM | POA: Diagnosis not present

## 2021-12-03 DIAGNOSIS — Z992 Dependence on renal dialysis: Secondary | ICD-10-CM | POA: Diagnosis not present

## 2021-12-07 DIAGNOSIS — N186 End stage renal disease: Secondary | ICD-10-CM | POA: Diagnosis not present

## 2021-12-07 DIAGNOSIS — N2581 Secondary hyperparathyroidism of renal origin: Secondary | ICD-10-CM | POA: Diagnosis not present

## 2021-12-07 DIAGNOSIS — Z992 Dependence on renal dialysis: Secondary | ICD-10-CM | POA: Diagnosis not present

## 2021-12-07 DIAGNOSIS — R6889 Other general symptoms and signs: Secondary | ICD-10-CM | POA: Diagnosis not present

## 2021-12-08 DIAGNOSIS — N186 End stage renal disease: Secondary | ICD-10-CM | POA: Diagnosis not present

## 2021-12-08 DIAGNOSIS — N2581 Secondary hyperparathyroidism of renal origin: Secondary | ICD-10-CM | POA: Diagnosis not present

## 2021-12-08 DIAGNOSIS — R6889 Other general symptoms and signs: Secondary | ICD-10-CM | POA: Diagnosis not present

## 2021-12-08 DIAGNOSIS — Z992 Dependence on renal dialysis: Secondary | ICD-10-CM | POA: Diagnosis not present

## 2021-12-10 DIAGNOSIS — Z992 Dependence on renal dialysis: Secondary | ICD-10-CM | POA: Diagnosis not present

## 2021-12-10 DIAGNOSIS — N2581 Secondary hyperparathyroidism of renal origin: Secondary | ICD-10-CM | POA: Diagnosis not present

## 2021-12-10 DIAGNOSIS — N186 End stage renal disease: Secondary | ICD-10-CM | POA: Diagnosis not present

## 2021-12-10 DIAGNOSIS — R6889 Other general symptoms and signs: Secondary | ICD-10-CM | POA: Diagnosis not present

## 2021-12-13 DIAGNOSIS — Z992 Dependence on renal dialysis: Secondary | ICD-10-CM | POA: Diagnosis not present

## 2021-12-13 DIAGNOSIS — N186 End stage renal disease: Secondary | ICD-10-CM | POA: Diagnosis not present

## 2021-12-13 DIAGNOSIS — N2581 Secondary hyperparathyroidism of renal origin: Secondary | ICD-10-CM | POA: Diagnosis not present

## 2021-12-13 DIAGNOSIS — R6889 Other general symptoms and signs: Secondary | ICD-10-CM | POA: Diagnosis not present

## 2021-12-15 DIAGNOSIS — N186 End stage renal disease: Secondary | ICD-10-CM | POA: Diagnosis not present

## 2021-12-15 DIAGNOSIS — N2581 Secondary hyperparathyroidism of renal origin: Secondary | ICD-10-CM | POA: Diagnosis not present

## 2021-12-15 DIAGNOSIS — Z992 Dependence on renal dialysis: Secondary | ICD-10-CM | POA: Diagnosis not present

## 2021-12-15 DIAGNOSIS — R6889 Other general symptoms and signs: Secondary | ICD-10-CM | POA: Diagnosis not present

## 2021-12-17 DIAGNOSIS — R6889 Other general symptoms and signs: Secondary | ICD-10-CM | POA: Diagnosis not present

## 2021-12-17 DIAGNOSIS — Z992 Dependence on renal dialysis: Secondary | ICD-10-CM | POA: Diagnosis not present

## 2021-12-17 DIAGNOSIS — N186 End stage renal disease: Secondary | ICD-10-CM | POA: Diagnosis not present

## 2021-12-17 DIAGNOSIS — N2581 Secondary hyperparathyroidism of renal origin: Secondary | ICD-10-CM | POA: Diagnosis not present

## 2021-12-20 DIAGNOSIS — N186 End stage renal disease: Secondary | ICD-10-CM | POA: Diagnosis not present

## 2021-12-20 DIAGNOSIS — R6889 Other general symptoms and signs: Secondary | ICD-10-CM | POA: Diagnosis not present

## 2021-12-20 DIAGNOSIS — N2581 Secondary hyperparathyroidism of renal origin: Secondary | ICD-10-CM | POA: Diagnosis not present

## 2021-12-20 DIAGNOSIS — Z992 Dependence on renal dialysis: Secondary | ICD-10-CM | POA: Diagnosis not present

## 2021-12-22 ENCOUNTER — Telehealth: Payer: Self-pay | Admitting: Neurology

## 2021-12-22 NOTE — Telephone Encounter (Signed)
I reviewed his chart. Our referral team called him on 12/06/21 to schedule his new patient appt for 12/23/21 at 10:45am. There have been no changes to his original appt. ? ?I called the patient back and confirmed this information. He verbally expressed his dissatisfaction with the transportation company. He did not give them a three day notice as requested and they are unable to "do him a favor". However, he says he can get someone to bring him anyway. He is aware to check-in at 10:15am.  ?

## 2021-12-22 NOTE — Telephone Encounter (Signed)
Pt LVM at 11:37am: have an appt tomorrow at 10:45 am. Was calling because transportation called you to try to reschedule my appt, they are not my POA. Can someone give me a call back? This is the second time they rescheduled my appts. I do not know who they think they are, but they not suppose to reschedule my appt. I will take this to the extreme if I have to. Please someone give me a call back ASAP. ?

## 2021-12-23 ENCOUNTER — Ambulatory Visit: Payer: Medicare HMO | Admitting: Neurology

## 2021-12-23 ENCOUNTER — Encounter: Payer: Self-pay | Admitting: Neurology

## 2021-12-24 DIAGNOSIS — Z992 Dependence on renal dialysis: Secondary | ICD-10-CM | POA: Diagnosis not present

## 2021-12-24 DIAGNOSIS — N186 End stage renal disease: Secondary | ICD-10-CM | POA: Diagnosis not present

## 2021-12-24 DIAGNOSIS — N2581 Secondary hyperparathyroidism of renal origin: Secondary | ICD-10-CM | POA: Diagnosis not present

## 2021-12-27 DIAGNOSIS — N2581 Secondary hyperparathyroidism of renal origin: Secondary | ICD-10-CM | POA: Diagnosis not present

## 2021-12-27 DIAGNOSIS — Z992 Dependence on renal dialysis: Secondary | ICD-10-CM | POA: Diagnosis not present

## 2021-12-27 DIAGNOSIS — R6889 Other general symptoms and signs: Secondary | ICD-10-CM | POA: Diagnosis not present

## 2021-12-27 DIAGNOSIS — N186 End stage renal disease: Secondary | ICD-10-CM | POA: Diagnosis not present

## 2021-12-29 DIAGNOSIS — N2581 Secondary hyperparathyroidism of renal origin: Secondary | ICD-10-CM | POA: Diagnosis not present

## 2021-12-29 DIAGNOSIS — R6889 Other general symptoms and signs: Secondary | ICD-10-CM | POA: Diagnosis not present

## 2021-12-29 DIAGNOSIS — N186 End stage renal disease: Secondary | ICD-10-CM | POA: Diagnosis not present

## 2021-12-29 DIAGNOSIS — Z992 Dependence on renal dialysis: Secondary | ICD-10-CM | POA: Diagnosis not present

## 2021-12-30 DIAGNOSIS — N2581 Secondary hyperparathyroidism of renal origin: Secondary | ICD-10-CM | POA: Diagnosis not present

## 2021-12-30 DIAGNOSIS — Z992 Dependence on renal dialysis: Secondary | ICD-10-CM | POA: Diagnosis not present

## 2021-12-30 DIAGNOSIS — N186 End stage renal disease: Secondary | ICD-10-CM | POA: Diagnosis not present

## 2021-12-30 DIAGNOSIS — Z7689 Persons encountering health services in other specified circumstances: Secondary | ICD-10-CM | POA: Diagnosis not present

## 2021-12-31 DIAGNOSIS — N186 End stage renal disease: Secondary | ICD-10-CM | POA: Diagnosis not present

## 2021-12-31 DIAGNOSIS — Z992 Dependence on renal dialysis: Secondary | ICD-10-CM | POA: Diagnosis not present

## 2021-12-31 DIAGNOSIS — E1129 Type 2 diabetes mellitus with other diabetic kidney complication: Secondary | ICD-10-CM | POA: Diagnosis not present

## 2021-12-31 DIAGNOSIS — N2581 Secondary hyperparathyroidism of renal origin: Secondary | ICD-10-CM | POA: Diagnosis not present

## 2021-12-31 DIAGNOSIS — R6889 Other general symptoms and signs: Secondary | ICD-10-CM | POA: Diagnosis not present

## 2022-01-01 HISTORY — PX: KIDNEY TRANSPLANT: SHX239

## 2022-01-03 DIAGNOSIS — Z992 Dependence on renal dialysis: Secondary | ICD-10-CM | POA: Diagnosis not present

## 2022-01-03 DIAGNOSIS — N2581 Secondary hyperparathyroidism of renal origin: Secondary | ICD-10-CM | POA: Diagnosis not present

## 2022-01-03 DIAGNOSIS — N186 End stage renal disease: Secondary | ICD-10-CM | POA: Diagnosis not present

## 2022-01-05 DIAGNOSIS — Z992 Dependence on renal dialysis: Secondary | ICD-10-CM | POA: Diagnosis not present

## 2022-01-05 DIAGNOSIS — N2581 Secondary hyperparathyroidism of renal origin: Secondary | ICD-10-CM | POA: Diagnosis not present

## 2022-01-05 DIAGNOSIS — N186 End stage renal disease: Secondary | ICD-10-CM | POA: Diagnosis not present

## 2022-01-06 DIAGNOSIS — R34 Anuria and oliguria: Secondary | ICD-10-CM | POA: Diagnosis not present

## 2022-01-06 DIAGNOSIS — E119 Type 2 diabetes mellitus without complications: Secondary | ICD-10-CM | POA: Diagnosis not present

## 2022-01-06 DIAGNOSIS — Z20822 Contact with and (suspected) exposure to covid-19: Secondary | ICD-10-CM | POA: Diagnosis not present

## 2022-01-06 DIAGNOSIS — E871 Hypo-osmolality and hyponatremia: Secondary | ICD-10-CM | POA: Diagnosis not present

## 2022-01-06 DIAGNOSIS — N186 End stage renal disease: Secondary | ICD-10-CM | POA: Diagnosis not present

## 2022-01-06 DIAGNOSIS — Z4822 Encounter for aftercare following kidney transplant: Secondary | ICD-10-CM | POA: Diagnosis not present

## 2022-01-06 DIAGNOSIS — D176 Benign lipomatous neoplasm of spermatic cord: Secondary | ICD-10-CM | POA: Diagnosis not present

## 2022-01-06 DIAGNOSIS — Z992 Dependence on renal dialysis: Secondary | ICD-10-CM | POA: Diagnosis not present

## 2022-01-06 DIAGNOSIS — R609 Edema, unspecified: Secondary | ICD-10-CM | POA: Diagnosis not present

## 2022-01-06 DIAGNOSIS — Z01818 Encounter for other preprocedural examination: Secondary | ICD-10-CM | POA: Diagnosis not present

## 2022-01-06 DIAGNOSIS — I10 Essential (primary) hypertension: Secondary | ICD-10-CM | POA: Diagnosis not present

## 2022-01-06 DIAGNOSIS — N269 Renal sclerosis, unspecified: Secondary | ICD-10-CM | POA: Diagnosis not present

## 2022-01-06 DIAGNOSIS — K4090 Unilateral inguinal hernia, without obstruction or gangrene, not specified as recurrent: Secondary | ICD-10-CM | POA: Diagnosis not present

## 2022-01-06 DIAGNOSIS — Z7952 Long term (current) use of systemic steroids: Secondary | ICD-10-CM | POA: Diagnosis not present

## 2022-01-06 DIAGNOSIS — K458 Other specified abdominal hernia without obstruction or gangrene: Secondary | ICD-10-CM | POA: Diagnosis not present

## 2022-01-06 DIAGNOSIS — Z5181 Encounter for therapeutic drug level monitoring: Secondary | ICD-10-CM | POA: Diagnosis not present

## 2022-01-06 DIAGNOSIS — I701 Atherosclerosis of renal artery: Secondary | ICD-10-CM | POA: Diagnosis not present

## 2022-01-06 DIAGNOSIS — Z79899 Other long term (current) drug therapy: Secondary | ICD-10-CM | POA: Diagnosis not present

## 2022-01-06 DIAGNOSIS — T8619 Other complication of kidney transplant: Secondary | ICD-10-CM | POA: Diagnosis not present

## 2022-01-06 DIAGNOSIS — R451 Restlessness and agitation: Secondary | ICD-10-CM | POA: Diagnosis not present

## 2022-01-06 DIAGNOSIS — E039 Hypothyroidism, unspecified: Secondary | ICD-10-CM | POA: Diagnosis not present

## 2022-01-06 DIAGNOSIS — N179 Acute kidney failure, unspecified: Secondary | ICD-10-CM | POA: Diagnosis not present

## 2022-01-06 DIAGNOSIS — E1122 Type 2 diabetes mellitus with diabetic chronic kidney disease: Secondary | ICD-10-CM | POA: Diagnosis not present

## 2022-01-06 DIAGNOSIS — Z940 Kidney transplant status: Secondary | ICD-10-CM | POA: Diagnosis not present

## 2022-01-06 DIAGNOSIS — N261 Atrophy of kidney (terminal): Secondary | ICD-10-CM | POA: Diagnosis not present

## 2022-01-06 DIAGNOSIS — Z79621 Long term (current) use of calcineurin inhibitor: Secondary | ICD-10-CM | POA: Diagnosis not present

## 2022-01-06 DIAGNOSIS — Z792 Long term (current) use of antibiotics: Secondary | ICD-10-CM | POA: Diagnosis not present

## 2022-01-06 DIAGNOSIS — S36892A Contusion of other intra-abdominal organs, initial encounter: Secondary | ICD-10-CM | POA: Diagnosis not present

## 2022-01-06 DIAGNOSIS — R638 Other symptoms and signs concerning food and fluid intake: Secondary | ICD-10-CM | POA: Diagnosis not present

## 2022-01-06 DIAGNOSIS — I120 Hypertensive chronic kidney disease with stage 5 chronic kidney disease or end stage renal disease: Secondary | ICD-10-CM | POA: Diagnosis not present

## 2022-01-06 DIAGNOSIS — N2889 Other specified disorders of kidney and ureter: Secondary | ICD-10-CM | POA: Diagnosis not present

## 2022-01-06 DIAGNOSIS — D849 Immunodeficiency, unspecified: Secondary | ICD-10-CM | POA: Diagnosis not present

## 2022-01-06 DIAGNOSIS — D62 Acute posthemorrhagic anemia: Secondary | ICD-10-CM | POA: Diagnosis not present

## 2022-01-06 DIAGNOSIS — K91870 Postprocedural hematoma of a digestive system organ or structure following a digestive system procedure: Secondary | ICD-10-CM | POA: Diagnosis not present

## 2022-01-07 DIAGNOSIS — Z940 Kidney transplant status: Secondary | ICD-10-CM | POA: Diagnosis not present

## 2022-01-07 DIAGNOSIS — Z4822 Encounter for aftercare following kidney transplant: Secondary | ICD-10-CM | POA: Diagnosis not present

## 2022-01-08 DIAGNOSIS — T8619 Other complication of kidney transplant: Secondary | ICD-10-CM | POA: Diagnosis not present

## 2022-01-08 DIAGNOSIS — N2889 Other specified disorders of kidney and ureter: Secondary | ICD-10-CM | POA: Diagnosis not present

## 2022-01-10 DIAGNOSIS — S36892A Contusion of other intra-abdominal organs, initial encounter: Secondary | ICD-10-CM | POA: Diagnosis not present

## 2022-01-10 DIAGNOSIS — Z4822 Encounter for aftercare following kidney transplant: Secondary | ICD-10-CM | POA: Diagnosis not present

## 2022-01-10 DIAGNOSIS — Z940 Kidney transplant status: Secondary | ICD-10-CM | POA: Diagnosis not present

## 2022-01-13 DIAGNOSIS — Z79899 Other long term (current) drug therapy: Secondary | ICD-10-CM | POA: Diagnosis not present

## 2022-01-13 DIAGNOSIS — D849 Immunodeficiency, unspecified: Secondary | ICD-10-CM | POA: Diagnosis not present

## 2022-01-13 DIAGNOSIS — Z5181 Encounter for therapeutic drug level monitoring: Secondary | ICD-10-CM | POA: Diagnosis not present

## 2022-01-13 DIAGNOSIS — I10 Essential (primary) hypertension: Secondary | ICD-10-CM | POA: Diagnosis not present

## 2022-01-13 DIAGNOSIS — D84821 Immunodeficiency due to drugs: Secondary | ICD-10-CM | POA: Diagnosis not present

## 2022-01-13 DIAGNOSIS — Z940 Kidney transplant status: Secondary | ICD-10-CM | POA: Diagnosis not present

## 2022-01-13 DIAGNOSIS — R7989 Other specified abnormal findings of blood chemistry: Secondary | ICD-10-CM | POA: Diagnosis not present

## 2022-01-13 DIAGNOSIS — E119 Type 2 diabetes mellitus without complications: Secondary | ICD-10-CM | POA: Diagnosis not present

## 2022-01-13 DIAGNOSIS — E8770 Fluid overload, unspecified: Secondary | ICD-10-CM | POA: Diagnosis not present

## 2022-01-13 DIAGNOSIS — D649 Anemia, unspecified: Secondary | ICD-10-CM | POA: Diagnosis not present

## 2022-01-13 DIAGNOSIS — Z79621 Long term (current) use of calcineurin inhibitor: Secondary | ICD-10-CM | POA: Diagnosis not present

## 2022-01-13 DIAGNOSIS — R110 Nausea: Secondary | ICD-10-CM | POA: Diagnosis not present

## 2022-01-13 DIAGNOSIS — Z4822 Encounter for aftercare following kidney transplant: Secondary | ICD-10-CM | POA: Diagnosis not present

## 2022-01-13 DIAGNOSIS — E785 Hyperlipidemia, unspecified: Secondary | ICD-10-CM | POA: Diagnosis not present

## 2022-01-13 DIAGNOSIS — Z609 Problem related to social environment, unspecified: Secondary | ICD-10-CM | POA: Diagnosis not present

## 2022-01-13 DIAGNOSIS — Z87891 Personal history of nicotine dependence: Secondary | ICD-10-CM | POA: Diagnosis not present

## 2022-01-13 DIAGNOSIS — E876 Hypokalemia: Secondary | ICD-10-CM | POA: Diagnosis not present

## 2022-01-13 DIAGNOSIS — T8619 Other complication of kidney transplant: Secondary | ICD-10-CM | POA: Diagnosis not present

## 2022-01-13 DIAGNOSIS — R109 Unspecified abdominal pain: Secondary | ICD-10-CM | POA: Diagnosis not present

## 2022-01-14 ENCOUNTER — Other Ambulatory Visit: Payer: Self-pay

## 2022-01-14 DIAGNOSIS — R110 Nausea: Secondary | ICD-10-CM | POA: Diagnosis not present

## 2022-01-14 DIAGNOSIS — Z940 Kidney transplant status: Secondary | ICD-10-CM | POA: Diagnosis not present

## 2022-01-14 DIAGNOSIS — D649 Anemia, unspecified: Secondary | ICD-10-CM | POA: Diagnosis not present

## 2022-01-14 DIAGNOSIS — Z4822 Encounter for aftercare following kidney transplant: Secondary | ICD-10-CM | POA: Diagnosis not present

## 2022-01-14 DIAGNOSIS — Z7952 Long term (current) use of systemic steroids: Secondary | ICD-10-CM | POA: Diagnosis not present

## 2022-01-14 DIAGNOSIS — I151 Hypertension secondary to other renal disorders: Secondary | ICD-10-CM | POA: Diagnosis not present

## 2022-01-14 DIAGNOSIS — Z5181 Encounter for therapeutic drug level monitoring: Secondary | ICD-10-CM | POA: Diagnosis not present

## 2022-01-14 DIAGNOSIS — Z87891 Personal history of nicotine dependence: Secondary | ICD-10-CM | POA: Diagnosis not present

## 2022-01-14 DIAGNOSIS — Z79899 Other long term (current) drug therapy: Secondary | ICD-10-CM | POA: Diagnosis not present

## 2022-01-14 DIAGNOSIS — Z792 Long term (current) use of antibiotics: Secondary | ICD-10-CM | POA: Diagnosis not present

## 2022-01-14 DIAGNOSIS — E785 Hyperlipidemia, unspecified: Secondary | ICD-10-CM | POA: Diagnosis not present

## 2022-01-14 DIAGNOSIS — I10 Essential (primary) hypertension: Secondary | ICD-10-CM | POA: Diagnosis not present

## 2022-01-14 DIAGNOSIS — D84821 Immunodeficiency due to drugs: Secondary | ICD-10-CM | POA: Diagnosis not present

## 2022-01-14 DIAGNOSIS — R7989 Other specified abnormal findings of blood chemistry: Secondary | ICD-10-CM | POA: Diagnosis not present

## 2022-01-14 DIAGNOSIS — Z79621 Long term (current) use of calcineurin inhibitor: Secondary | ICD-10-CM | POA: Diagnosis not present

## 2022-01-14 DIAGNOSIS — E1121 Type 2 diabetes mellitus with diabetic nephropathy: Secondary | ICD-10-CM | POA: Diagnosis not present

## 2022-01-14 DIAGNOSIS — R109 Unspecified abdominal pain: Secondary | ICD-10-CM | POA: Diagnosis not present

## 2022-01-14 DIAGNOSIS — E8770 Fluid overload, unspecified: Secondary | ICD-10-CM | POA: Diagnosis not present

## 2022-01-14 NOTE — Patient Outreach (Signed)
[facility]) Care Management ? ?01/14/2022 ? ?Edward Lia Radovich Sr. ?1970-12-06 ?[id number] ? ? ?[employer] member referral for transition of care needs. Request assigned to Jon Billings, RN for follow up. ? ?Edward [name] ?[facility]-Care Management Assistant ?[phone]  ?

## 2022-01-14 NOTE — Patient Outreach (Signed)
[facility]) Care Management ? ?01/14/2022 ? ?Audrie Lia Harney Sr. ?12-24-1970 ?[id number] ? ? ?Referral Date: 01/14/22 ?Referral Source: [employer] Report ?Date of Discharge: 01/10/22 ?Facility: [place]: [employer]  ? ?Referral received.  Patient currently active with [facility] Transplant Care Management. ? ? ?Plan: ?RN CM will close case.   ? ? ?Jone Baseman, RN, MSN ?[facility] Care Management ?Care Management Coordinator ?Direct Line [phone] ?Toll Free: [phone]  ?Fax: [phone] ? ?

## 2022-01-17 DIAGNOSIS — E119 Type 2 diabetes mellitus without complications: Secondary | ICD-10-CM | POA: Diagnosis not present

## 2022-01-17 DIAGNOSIS — Z79621 Long term (current) use of calcineurin inhibitor: Secondary | ICD-10-CM | POA: Diagnosis not present

## 2022-01-17 DIAGNOSIS — Z4822 Encounter for aftercare following kidney transplant: Secondary | ICD-10-CM | POA: Diagnosis not present

## 2022-01-17 DIAGNOSIS — Z79890 Hormone replacement therapy (postmenopausal): Secondary | ICD-10-CM | POA: Diagnosis not present

## 2022-01-17 DIAGNOSIS — Z7952 Long term (current) use of systemic steroids: Secondary | ICD-10-CM | POA: Diagnosis not present

## 2022-01-17 DIAGNOSIS — Z7969 Long term (current) use of other immunomodulators and immunosuppressants: Secondary | ICD-10-CM | POA: Diagnosis not present

## 2022-01-17 DIAGNOSIS — Z792 Long term (current) use of antibiotics: Secondary | ICD-10-CM | POA: Diagnosis not present

## 2022-01-17 DIAGNOSIS — D649 Anemia, unspecified: Secondary | ICD-10-CM | POA: Diagnosis not present

## 2022-01-17 DIAGNOSIS — E785 Hyperlipidemia, unspecified: Secondary | ICD-10-CM | POA: Diagnosis not present

## 2022-01-17 DIAGNOSIS — T8619 Other complication of kidney transplant: Secondary | ICD-10-CM | POA: Diagnosis not present

## 2022-01-17 DIAGNOSIS — I10 Essential (primary) hypertension: Secondary | ICD-10-CM | POA: Diagnosis not present

## 2022-01-17 DIAGNOSIS — F17200 Nicotine dependence, unspecified, uncomplicated: Secondary | ICD-10-CM | POA: Diagnosis not present

## 2022-01-17 DIAGNOSIS — Z7982 Long term (current) use of aspirin: Secondary | ICD-10-CM | POA: Diagnosis not present

## 2022-01-17 DIAGNOSIS — A539 Syphilis, unspecified: Secondary | ICD-10-CM | POA: Diagnosis not present

## 2022-01-17 DIAGNOSIS — D849 Immunodeficiency, unspecified: Secondary | ICD-10-CM | POA: Diagnosis not present

## 2022-01-19 DIAGNOSIS — T8619 Other complication of kidney transplant: Secondary | ICD-10-CM | POA: Diagnosis not present

## 2022-01-19 DIAGNOSIS — Z4822 Encounter for aftercare following kidney transplant: Secondary | ICD-10-CM | POA: Diagnosis not present

## 2022-01-19 DIAGNOSIS — D849 Immunodeficiency, unspecified: Secondary | ICD-10-CM | POA: Diagnosis not present

## 2022-01-19 DIAGNOSIS — Z940 Kidney transplant status: Secondary | ICD-10-CM | POA: Diagnosis not present

## 2022-01-19 DIAGNOSIS — A539 Syphilis, unspecified: Secondary | ICD-10-CM | POA: Diagnosis not present

## 2022-01-19 DIAGNOSIS — Z792 Long term (current) use of antibiotics: Secondary | ICD-10-CM | POA: Diagnosis not present

## 2022-01-19 DIAGNOSIS — Z7952 Long term (current) use of systemic steroids: Secondary | ICD-10-CM | POA: Diagnosis not present

## 2022-01-19 DIAGNOSIS — I151 Hypertension secondary to other renal disorders: Secondary | ICD-10-CM | POA: Diagnosis not present

## 2022-01-19 DIAGNOSIS — N2889 Other specified disorders of kidney and ureter: Secondary | ICD-10-CM | POA: Diagnosis not present

## 2022-01-19 DIAGNOSIS — I10 Essential (primary) hypertension: Secondary | ICD-10-CM | POA: Diagnosis not present

## 2022-01-19 DIAGNOSIS — E785 Hyperlipidemia, unspecified: Secondary | ICD-10-CM | POA: Diagnosis not present

## 2022-01-19 DIAGNOSIS — D649 Anemia, unspecified: Secondary | ICD-10-CM | POA: Diagnosis not present

## 2022-01-19 DIAGNOSIS — Z5181 Encounter for therapeutic drug level monitoring: Secondary | ICD-10-CM | POA: Diagnosis not present

## 2022-01-19 DIAGNOSIS — Z79621 Long term (current) use of calcineurin inhibitor: Secondary | ICD-10-CM | POA: Diagnosis not present

## 2022-01-20 ENCOUNTER — Other Ambulatory Visit: Payer: Self-pay | Admitting: Nurse Practitioner

## 2022-01-21 DIAGNOSIS — F17200 Nicotine dependence, unspecified, uncomplicated: Secondary | ICD-10-CM | POA: Diagnosis not present

## 2022-01-21 DIAGNOSIS — Z79621 Long term (current) use of calcineurin inhibitor: Secondary | ICD-10-CM | POA: Diagnosis not present

## 2022-01-21 DIAGNOSIS — D649 Anemia, unspecified: Secondary | ICD-10-CM | POA: Diagnosis not present

## 2022-01-21 DIAGNOSIS — I10 Essential (primary) hypertension: Secondary | ICD-10-CM | POA: Diagnosis not present

## 2022-01-21 DIAGNOSIS — Z5181 Encounter for therapeutic drug level monitoring: Secondary | ICD-10-CM | POA: Diagnosis not present

## 2022-01-21 DIAGNOSIS — Z7952 Long term (current) use of systemic steroids: Secondary | ICD-10-CM | POA: Diagnosis not present

## 2022-01-21 DIAGNOSIS — E1122 Type 2 diabetes mellitus with diabetic chronic kidney disease: Secondary | ICD-10-CM | POA: Diagnosis not present

## 2022-01-21 DIAGNOSIS — E785 Hyperlipidemia, unspecified: Secondary | ICD-10-CM | POA: Diagnosis not present

## 2022-01-21 DIAGNOSIS — Z79899 Other long term (current) drug therapy: Secondary | ICD-10-CM | POA: Diagnosis not present

## 2022-01-21 DIAGNOSIS — Z940 Kidney transplant status: Secondary | ICD-10-CM | POA: Diagnosis not present

## 2022-01-21 DIAGNOSIS — I151 Hypertension secondary to other renal disorders: Secondary | ICD-10-CM | POA: Diagnosis not present

## 2022-01-21 DIAGNOSIS — Z659 Problem related to unspecified psychosocial circumstances: Secondary | ICD-10-CM | POA: Diagnosis not present

## 2022-01-21 DIAGNOSIS — Z4822 Encounter for aftercare following kidney transplant: Secondary | ICD-10-CM | POA: Diagnosis not present

## 2022-01-21 DIAGNOSIS — E119 Type 2 diabetes mellitus without complications: Secondary | ICD-10-CM | POA: Diagnosis not present

## 2022-01-25 ENCOUNTER — Ambulatory Visit: Payer: Medicare HMO | Admitting: Family Medicine

## 2022-01-25 DIAGNOSIS — Z5181 Encounter for therapeutic drug level monitoring: Secondary | ICD-10-CM | POA: Diagnosis not present

## 2022-01-25 DIAGNOSIS — Z7952 Long term (current) use of systemic steroids: Secondary | ICD-10-CM | POA: Diagnosis not present

## 2022-01-25 DIAGNOSIS — Z940 Kidney transplant status: Secondary | ICD-10-CM | POA: Diagnosis not present

## 2022-01-25 DIAGNOSIS — I151 Hypertension secondary to other renal disorders: Secondary | ICD-10-CM | POA: Diagnosis not present

## 2022-01-25 DIAGNOSIS — E785 Hyperlipidemia, unspecified: Secondary | ICD-10-CM | POA: Diagnosis not present

## 2022-01-25 DIAGNOSIS — Z79899 Other long term (current) drug therapy: Secondary | ICD-10-CM | POA: Diagnosis not present

## 2022-01-25 DIAGNOSIS — Z79890 Hormone replacement therapy (postmenopausal): Secondary | ICD-10-CM | POA: Diagnosis not present

## 2022-01-25 DIAGNOSIS — Z792 Long term (current) use of antibiotics: Secondary | ICD-10-CM | POA: Diagnosis not present

## 2022-01-25 DIAGNOSIS — Z659 Problem related to unspecified psychosocial circumstances: Secondary | ICD-10-CM | POA: Diagnosis not present

## 2022-01-25 DIAGNOSIS — I10 Essential (primary) hypertension: Secondary | ICD-10-CM | POA: Diagnosis not present

## 2022-01-25 DIAGNOSIS — Z79621 Long term (current) use of calcineurin inhibitor: Secondary | ICD-10-CM | POA: Diagnosis not present

## 2022-01-25 DIAGNOSIS — D849 Immunodeficiency, unspecified: Secondary | ICD-10-CM | POA: Diagnosis not present

## 2022-01-25 DIAGNOSIS — Z4822 Encounter for aftercare following kidney transplant: Secondary | ICD-10-CM | POA: Diagnosis not present

## 2022-01-25 DIAGNOSIS — E1122 Type 2 diabetes mellitus with diabetic chronic kidney disease: Secondary | ICD-10-CM | POA: Diagnosis not present

## 2022-01-25 DIAGNOSIS — A539 Syphilis, unspecified: Secondary | ICD-10-CM | POA: Diagnosis not present

## 2022-01-25 DIAGNOSIS — E119 Type 2 diabetes mellitus without complications: Secondary | ICD-10-CM | POA: Diagnosis not present

## 2022-01-25 DIAGNOSIS — D649 Anemia, unspecified: Secondary | ICD-10-CM | POA: Diagnosis not present

## 2022-01-27 DIAGNOSIS — N186 End stage renal disease: Secondary | ICD-10-CM | POA: Diagnosis not present

## 2022-01-27 DIAGNOSIS — Z940 Kidney transplant status: Secondary | ICD-10-CM | POA: Diagnosis not present

## 2022-01-27 DIAGNOSIS — Z659 Problem related to unspecified psychosocial circumstances: Secondary | ICD-10-CM | POA: Diagnosis not present

## 2022-01-27 DIAGNOSIS — Z79621 Long term (current) use of calcineurin inhibitor: Secondary | ICD-10-CM | POA: Diagnosis not present

## 2022-01-27 DIAGNOSIS — Z5181 Encounter for therapeutic drug level monitoring: Secondary | ICD-10-CM | POA: Diagnosis not present

## 2022-01-27 DIAGNOSIS — Z79899 Other long term (current) drug therapy: Secondary | ICD-10-CM | POA: Diagnosis not present

## 2022-01-27 DIAGNOSIS — Z7952 Long term (current) use of systemic steroids: Secondary | ICD-10-CM | POA: Diagnosis not present

## 2022-01-27 DIAGNOSIS — Z8639 Personal history of other endocrine, nutritional and metabolic disease: Secondary | ICD-10-CM | POA: Diagnosis not present

## 2022-01-27 DIAGNOSIS — E785 Hyperlipidemia, unspecified: Secondary | ICD-10-CM | POA: Diagnosis not present

## 2022-01-27 DIAGNOSIS — R7989 Other specified abnormal findings of blood chemistry: Secondary | ICD-10-CM | POA: Diagnosis not present

## 2022-01-27 DIAGNOSIS — R197 Diarrhea, unspecified: Secondary | ICD-10-CM | POA: Diagnosis not present

## 2022-01-27 DIAGNOSIS — Z4822 Encounter for aftercare following kidney transplant: Secondary | ICD-10-CM | POA: Diagnosis not present

## 2022-01-27 DIAGNOSIS — E1122 Type 2 diabetes mellitus with diabetic chronic kidney disease: Secondary | ICD-10-CM | POA: Diagnosis not present

## 2022-01-27 DIAGNOSIS — D649 Anemia, unspecified: Secondary | ICD-10-CM | POA: Diagnosis not present

## 2022-01-31 DIAGNOSIS — N186 End stage renal disease: Secondary | ICD-10-CM | POA: Diagnosis not present

## 2022-01-31 DIAGNOSIS — E785 Hyperlipidemia, unspecified: Secondary | ICD-10-CM | POA: Diagnosis not present

## 2022-01-31 DIAGNOSIS — I120 Hypertensive chronic kidney disease with stage 5 chronic kidney disease or end stage renal disease: Secondary | ICD-10-CM | POA: Diagnosis not present

## 2022-01-31 DIAGNOSIS — E1169 Type 2 diabetes mellitus with other specified complication: Secondary | ICD-10-CM | POA: Diagnosis not present

## 2022-01-31 DIAGNOSIS — I10 Essential (primary) hypertension: Secondary | ICD-10-CM | POA: Diagnosis not present

## 2022-01-31 DIAGNOSIS — E1122 Type 2 diabetes mellitus with diabetic chronic kidney disease: Secondary | ICD-10-CM | POA: Diagnosis not present

## 2022-01-31 DIAGNOSIS — D8989 Other specified disorders involving the immune mechanism, not elsewhere classified: Secondary | ICD-10-CM | POA: Diagnosis not present

## 2022-01-31 DIAGNOSIS — D849 Immunodeficiency, unspecified: Secondary | ICD-10-CM | POA: Diagnosis not present

## 2022-01-31 DIAGNOSIS — D649 Anemia, unspecified: Secondary | ICD-10-CM | POA: Diagnosis not present

## 2022-01-31 DIAGNOSIS — Z792 Long term (current) use of antibiotics: Secondary | ICD-10-CM | POA: Diagnosis not present

## 2022-01-31 DIAGNOSIS — Z940 Kidney transplant status: Secondary | ICD-10-CM | POA: Diagnosis not present

## 2022-01-31 DIAGNOSIS — Z4822 Encounter for aftercare following kidney transplant: Secondary | ICD-10-CM | POA: Diagnosis not present

## 2022-01-31 DIAGNOSIS — E119 Type 2 diabetes mellitus without complications: Secondary | ICD-10-CM | POA: Diagnosis not present

## 2022-02-03 DIAGNOSIS — R7989 Other specified abnormal findings of blood chemistry: Secondary | ICD-10-CM | POA: Diagnosis not present

## 2022-02-03 DIAGNOSIS — E785 Hyperlipidemia, unspecified: Secondary | ICD-10-CM | POA: Diagnosis not present

## 2022-02-03 DIAGNOSIS — I151 Hypertension secondary to other renal disorders: Secondary | ICD-10-CM | POA: Diagnosis not present

## 2022-02-03 DIAGNOSIS — K30 Functional dyspepsia: Secondary | ICD-10-CM | POA: Diagnosis not present

## 2022-02-03 DIAGNOSIS — Z4822 Encounter for aftercare following kidney transplant: Secondary | ICD-10-CM | POA: Diagnosis not present

## 2022-02-03 DIAGNOSIS — D649 Anemia, unspecified: Secondary | ICD-10-CM | POA: Diagnosis not present

## 2022-02-03 DIAGNOSIS — Z79899 Other long term (current) drug therapy: Secondary | ICD-10-CM | POA: Diagnosis not present

## 2022-02-03 DIAGNOSIS — N2889 Other specified disorders of kidney and ureter: Secondary | ICD-10-CM | POA: Diagnosis not present

## 2022-02-03 DIAGNOSIS — Z5181 Encounter for therapeutic drug level monitoring: Secondary | ICD-10-CM | POA: Diagnosis not present

## 2022-02-03 DIAGNOSIS — D84821 Immunodeficiency due to drugs: Secondary | ICD-10-CM | POA: Diagnosis not present

## 2022-02-03 DIAGNOSIS — Z79621 Long term (current) use of calcineurin inhibitor: Secondary | ICD-10-CM | POA: Diagnosis not present

## 2022-02-03 DIAGNOSIS — E1121 Type 2 diabetes mellitus with diabetic nephropathy: Secondary | ICD-10-CM | POA: Diagnosis not present

## 2022-02-03 DIAGNOSIS — Z940 Kidney transplant status: Secondary | ICD-10-CM | POA: Diagnosis not present

## 2022-02-07 DIAGNOSIS — Z7901 Long term (current) use of anticoagulants: Secondary | ICD-10-CM | POA: Diagnosis not present

## 2022-02-07 DIAGNOSIS — Z5181 Encounter for therapeutic drug level monitoring: Secondary | ICD-10-CM | POA: Diagnosis not present

## 2022-02-07 DIAGNOSIS — Z940 Kidney transplant status: Secondary | ICD-10-CM | POA: Diagnosis not present

## 2022-02-07 DIAGNOSIS — I82891 Chronic embolism and thrombosis of other specified veins: Secondary | ICD-10-CM | POA: Diagnosis not present

## 2022-02-07 DIAGNOSIS — I824Y9 Acute embolism and thrombosis of unspecified deep veins of unspecified proximal lower extremity: Secondary | ICD-10-CM | POA: Diagnosis not present

## 2022-02-07 DIAGNOSIS — R600 Localized edema: Secondary | ICD-10-CM | POA: Diagnosis not present

## 2022-02-07 DIAGNOSIS — Z79621 Long term (current) use of calcineurin inhibitor: Secondary | ICD-10-CM | POA: Diagnosis not present

## 2022-02-07 DIAGNOSIS — Z792 Long term (current) use of antibiotics: Secondary | ICD-10-CM | POA: Diagnosis not present

## 2022-02-07 DIAGNOSIS — Z4822 Encounter for aftercare following kidney transplant: Secondary | ICD-10-CM | POA: Diagnosis not present

## 2022-02-07 DIAGNOSIS — Z7952 Long term (current) use of systemic steroids: Secondary | ICD-10-CM | POA: Diagnosis not present

## 2022-02-07 DIAGNOSIS — Z79624 Long term (current) use of inhibitors of nucleotide synthesis: Secondary | ICD-10-CM | POA: Diagnosis not present

## 2022-02-07 DIAGNOSIS — I151 Hypertension secondary to other renal disorders: Secondary | ICD-10-CM | POA: Diagnosis not present

## 2022-02-07 DIAGNOSIS — I82413 Acute embolism and thrombosis of femoral vein, bilateral: Secondary | ICD-10-CM | POA: Diagnosis not present

## 2022-02-07 DIAGNOSIS — I82512 Chronic embolism and thrombosis of left femoral vein: Secondary | ICD-10-CM | POA: Diagnosis not present

## 2022-02-07 DIAGNOSIS — N2889 Other specified disorders of kidney and ureter: Secondary | ICD-10-CM | POA: Diagnosis not present

## 2022-02-07 DIAGNOSIS — Z79899 Other long term (current) drug therapy: Secondary | ICD-10-CM | POA: Diagnosis not present

## 2022-02-10 DIAGNOSIS — Z7969 Long term (current) use of other immunomodulators and immunosuppressants: Secondary | ICD-10-CM | POA: Diagnosis not present

## 2022-02-10 DIAGNOSIS — I10 Essential (primary) hypertension: Secondary | ICD-10-CM | POA: Diagnosis not present

## 2022-02-10 DIAGNOSIS — I824Y2 Acute embolism and thrombosis of unspecified deep veins of left proximal lower extremity: Secondary | ICD-10-CM | POA: Diagnosis not present

## 2022-02-10 DIAGNOSIS — I824Y9 Acute embolism and thrombosis of unspecified deep veins of unspecified proximal lower extremity: Secondary | ICD-10-CM | POA: Diagnosis not present

## 2022-02-10 DIAGNOSIS — E785 Hyperlipidemia, unspecified: Secondary | ICD-10-CM | POA: Diagnosis not present

## 2022-02-10 DIAGNOSIS — Z5181 Encounter for therapeutic drug level monitoring: Secondary | ICD-10-CM | POA: Diagnosis not present

## 2022-02-10 DIAGNOSIS — Z4822 Encounter for aftercare following kidney transplant: Secondary | ICD-10-CM | POA: Diagnosis not present

## 2022-02-10 DIAGNOSIS — R600 Localized edema: Secondary | ICD-10-CM | POA: Diagnosis not present

## 2022-02-10 DIAGNOSIS — I82513 Chronic embolism and thrombosis of femoral vein, bilateral: Secondary | ICD-10-CM | POA: Diagnosis not present

## 2022-02-10 DIAGNOSIS — D849 Immunodeficiency, unspecified: Secondary | ICD-10-CM | POA: Diagnosis not present

## 2022-02-10 DIAGNOSIS — Z940 Kidney transplant status: Secondary | ICD-10-CM | POA: Diagnosis not present

## 2022-02-10 DIAGNOSIS — Z7901 Long term (current) use of anticoagulants: Secondary | ICD-10-CM | POA: Diagnosis not present

## 2022-02-10 DIAGNOSIS — E118 Type 2 diabetes mellitus with unspecified complications: Secondary | ICD-10-CM | POA: Diagnosis not present

## 2022-02-10 DIAGNOSIS — Z79621 Long term (current) use of calcineurin inhibitor: Secondary | ICD-10-CM | POA: Diagnosis not present

## 2022-02-16 ENCOUNTER — Ambulatory Visit: Payer: Medicare HMO | Admitting: Physician Assistant

## 2022-02-18 DIAGNOSIS — Z4822 Encounter for aftercare following kidney transplant: Secondary | ICD-10-CM | POA: Diagnosis not present

## 2022-02-18 DIAGNOSIS — E1121 Type 2 diabetes mellitus with diabetic nephropathy: Secondary | ICD-10-CM | POA: Diagnosis not present

## 2022-02-18 DIAGNOSIS — I151 Hypertension secondary to other renal disorders: Secondary | ICD-10-CM | POA: Diagnosis not present

## 2022-02-18 DIAGNOSIS — Z5181 Encounter for therapeutic drug level monitoring: Secondary | ICD-10-CM | POA: Diagnosis not present

## 2022-02-18 DIAGNOSIS — I824Y9 Acute embolism and thrombosis of unspecified deep veins of unspecified proximal lower extremity: Secondary | ICD-10-CM | POA: Diagnosis not present

## 2022-02-18 DIAGNOSIS — I10 Essential (primary) hypertension: Secondary | ICD-10-CM | POA: Diagnosis not present

## 2022-02-18 DIAGNOSIS — Z940 Kidney transplant status: Secondary | ICD-10-CM | POA: Diagnosis not present

## 2022-02-18 DIAGNOSIS — D649 Anemia, unspecified: Secondary | ICD-10-CM | POA: Diagnosis not present

## 2022-02-18 DIAGNOSIS — Z8639 Personal history of other endocrine, nutritional and metabolic disease: Secondary | ICD-10-CM | POA: Diagnosis not present

## 2022-02-18 DIAGNOSIS — I82513 Chronic embolism and thrombosis of femoral vein, bilateral: Secondary | ICD-10-CM | POA: Diagnosis not present

## 2022-02-18 DIAGNOSIS — E785 Hyperlipidemia, unspecified: Secondary | ICD-10-CM | POA: Diagnosis not present

## 2022-02-18 DIAGNOSIS — R600 Localized edema: Secondary | ICD-10-CM | POA: Diagnosis not present

## 2022-02-18 DIAGNOSIS — D849 Immunodeficiency, unspecified: Secondary | ICD-10-CM | POA: Diagnosis not present

## 2022-02-18 DIAGNOSIS — R7989 Other specified abnormal findings of blood chemistry: Secondary | ICD-10-CM | POA: Diagnosis not present

## 2022-02-25 DIAGNOSIS — Z79621 Long term (current) use of calcineurin inhibitor: Secondary | ICD-10-CM | POA: Diagnosis not present

## 2022-02-25 DIAGNOSIS — I10 Essential (primary) hypertension: Secondary | ICD-10-CM | POA: Diagnosis not present

## 2022-02-25 DIAGNOSIS — I82412 Acute embolism and thrombosis of left femoral vein: Secondary | ICD-10-CM | POA: Diagnosis not present

## 2022-02-25 DIAGNOSIS — I151 Hypertension secondary to other renal disorders: Secondary | ICD-10-CM | POA: Diagnosis not present

## 2022-02-25 DIAGNOSIS — Z4822 Encounter for aftercare following kidney transplant: Secondary | ICD-10-CM | POA: Diagnosis not present

## 2022-02-25 DIAGNOSIS — Z940 Kidney transplant status: Secondary | ICD-10-CM | POA: Diagnosis not present

## 2022-02-25 DIAGNOSIS — N2889 Other specified disorders of kidney and ureter: Secondary | ICD-10-CM | POA: Diagnosis not present

## 2022-02-25 DIAGNOSIS — Z7952 Long term (current) use of systemic steroids: Secondary | ICD-10-CM | POA: Diagnosis not present

## 2022-02-25 DIAGNOSIS — Z7901 Long term (current) use of anticoagulants: Secondary | ICD-10-CM | POA: Diagnosis not present

## 2022-02-25 DIAGNOSIS — Z79624 Long term (current) use of inhibitors of nucleotide synthesis: Secondary | ICD-10-CM | POA: Diagnosis not present

## 2022-02-25 DIAGNOSIS — D849 Immunodeficiency, unspecified: Secondary | ICD-10-CM | POA: Diagnosis not present

## 2022-02-25 DIAGNOSIS — E1121 Type 2 diabetes mellitus with diabetic nephropathy: Secondary | ICD-10-CM | POA: Diagnosis not present

## 2022-02-25 DIAGNOSIS — Z792 Long term (current) use of antibiotics: Secondary | ICD-10-CM | POA: Diagnosis not present

## 2022-02-25 DIAGNOSIS — I82511 Chronic embolism and thrombosis of right femoral vein: Secondary | ICD-10-CM | POA: Diagnosis not present

## 2022-03-02 ENCOUNTER — Ambulatory Visit (INDEPENDENT_AMBULATORY_CARE_PROVIDER_SITE_OTHER): Payer: Medicare HMO | Admitting: Podiatry

## 2022-03-02 ENCOUNTER — Encounter: Payer: Self-pay | Admitting: Podiatry

## 2022-03-02 DIAGNOSIS — E1142 Type 2 diabetes mellitus with diabetic polyneuropathy: Secondary | ICD-10-CM | POA: Diagnosis not present

## 2022-03-02 DIAGNOSIS — M79674 Pain in right toe(s): Secondary | ICD-10-CM | POA: Diagnosis not present

## 2022-03-02 DIAGNOSIS — M79675 Pain in left toe(s): Secondary | ICD-10-CM

## 2022-03-02 DIAGNOSIS — B351 Tinea unguium: Secondary | ICD-10-CM | POA: Diagnosis not present

## 2022-03-02 NOTE — Progress Notes (Signed)
This patient returns to my office for at risk foot care.  This patient requires this care by a professional since this patient will be at risk due to having ESRD, Blind,   and DM with polyneuropathy.  This patient is unable to cut nails himself since the patient cannot reach his nails.These nails are painful walking and wearing shoes.  Patient has kidney transplant. This patient presents for at risk foot care today.  General Appearance  Alert, conversant and in no acute stress.  Vascular  Dorsalis pedis and posterior tibial  pulses are  weakly palpable  bilaterally.  Capillary return is within normal limits  bilaterally. Cold feet. bilaterally.  Neurologic  Senn-Weinstein monofilament wire test diminished  bilaterally. Muscle power within normal limits bilaterally.  Nails Thick disfigured discolored nails with subungual debris  from hallux to fifth toes bilaterally. No evidence of bacterial infection or drainage bilaterally. HAV  B/L.  Orthopedic  No limitations of motion  feet .  No crepitus or effusions noted.  No bony pathology or digital deformities noted.  HAV  B/L.    Skin  normotropic skin with no porokeratosis noted bilaterally.  No signs of infections or ulcers noted.     Onychomycosis  Pain in right toes  Pain in left toes  Consent was obtained for treatment procedures.   Mechanical debridement of nails 1-5  bilaterally performed with a nail nipper.  Filed with dremel without incident.    Return office visit  4 months                    Told patient to return for periodic foot care and evaluation due to potential at risk complications.   Savonna Birchmeier DPM   

## 2022-03-04 DIAGNOSIS — I10 Essential (primary) hypertension: Secondary | ICD-10-CM | POA: Diagnosis not present

## 2022-03-04 DIAGNOSIS — F17200 Nicotine dependence, unspecified, uncomplicated: Secondary | ICD-10-CM | POA: Diagnosis not present

## 2022-03-04 DIAGNOSIS — A539 Syphilis, unspecified: Secondary | ICD-10-CM | POA: Diagnosis not present

## 2022-03-04 DIAGNOSIS — D649 Anemia, unspecified: Secondary | ICD-10-CM | POA: Diagnosis not present

## 2022-03-04 DIAGNOSIS — I82501 Chronic embolism and thrombosis of unspecified deep veins of right lower extremity: Secondary | ICD-10-CM | POA: Diagnosis not present

## 2022-03-04 DIAGNOSIS — Z940 Kidney transplant status: Secondary | ICD-10-CM | POA: Diagnosis not present

## 2022-03-04 DIAGNOSIS — E119 Type 2 diabetes mellitus without complications: Secondary | ICD-10-CM | POA: Diagnosis not present

## 2022-03-04 DIAGNOSIS — Z4822 Encounter for aftercare following kidney transplant: Secondary | ICD-10-CM | POA: Diagnosis not present

## 2022-03-04 DIAGNOSIS — G8929 Other chronic pain: Secondary | ICD-10-CM | POA: Diagnosis not present

## 2022-03-04 DIAGNOSIS — D849 Immunodeficiency, unspecified: Secondary | ICD-10-CM | POA: Diagnosis not present

## 2022-03-09 DIAGNOSIS — N400 Enlarged prostate without lower urinary tract symptoms: Secondary | ICD-10-CM | POA: Diagnosis not present

## 2022-03-09 DIAGNOSIS — Z466 Encounter for fitting and adjustment of urinary device: Secondary | ICD-10-CM | POA: Diagnosis not present

## 2022-03-09 DIAGNOSIS — Z4822 Encounter for aftercare following kidney transplant: Secondary | ICD-10-CM | POA: Diagnosis not present

## 2022-03-09 DIAGNOSIS — Z9889 Other specified postprocedural states: Secondary | ICD-10-CM | POA: Diagnosis not present

## 2022-03-10 ENCOUNTER — Ambulatory Visit: Payer: Medicare HMO | Admitting: Physician Assistant

## 2022-03-14 DIAGNOSIS — Z4822 Encounter for aftercare following kidney transplant: Secondary | ICD-10-CM | POA: Diagnosis not present

## 2022-03-14 DIAGNOSIS — I10 Essential (primary) hypertension: Secondary | ICD-10-CM | POA: Diagnosis not present

## 2022-03-14 DIAGNOSIS — Z5181 Encounter for therapeutic drug level monitoring: Secondary | ICD-10-CM | POA: Diagnosis not present

## 2022-03-14 DIAGNOSIS — I82503 Chronic embolism and thrombosis of unspecified deep veins of lower extremity, bilateral: Secondary | ICD-10-CM | POA: Diagnosis not present

## 2022-03-14 DIAGNOSIS — Z79621 Long term (current) use of calcineurin inhibitor: Secondary | ICD-10-CM | POA: Diagnosis not present

## 2022-03-14 DIAGNOSIS — Z940 Kidney transplant status: Secondary | ICD-10-CM | POA: Diagnosis not present

## 2022-03-14 DIAGNOSIS — D649 Anemia, unspecified: Secondary | ICD-10-CM | POA: Diagnosis not present

## 2022-03-14 DIAGNOSIS — I151 Hypertension secondary to other renal disorders: Secondary | ICD-10-CM | POA: Diagnosis not present

## 2022-03-14 DIAGNOSIS — Z792 Long term (current) use of antibiotics: Secondary | ICD-10-CM | POA: Diagnosis not present

## 2022-03-14 DIAGNOSIS — Z5982 Transportation insecurity: Secondary | ICD-10-CM | POA: Diagnosis not present

## 2022-03-14 DIAGNOSIS — N2889 Other specified disorders of kidney and ureter: Secondary | ICD-10-CM | POA: Diagnosis not present

## 2022-03-14 DIAGNOSIS — R109 Unspecified abdominal pain: Secondary | ICD-10-CM | POA: Diagnosis not present

## 2022-03-14 DIAGNOSIS — Z7952 Long term (current) use of systemic steroids: Secondary | ICD-10-CM | POA: Diagnosis not present

## 2022-03-14 DIAGNOSIS — Z79899 Other long term (current) drug therapy: Secondary | ICD-10-CM | POA: Diagnosis not present

## 2022-03-14 DIAGNOSIS — Z5919 Other inadequate housing: Secondary | ICD-10-CM | POA: Diagnosis not present

## 2022-03-15 ENCOUNTER — Other Ambulatory Visit: Payer: Self-pay

## 2022-03-15 ENCOUNTER — Emergency Department (HOSPITAL_COMMUNITY): Payer: Medicare HMO

## 2022-03-15 ENCOUNTER — Encounter (HOSPITAL_COMMUNITY): Payer: Self-pay

## 2022-03-15 ENCOUNTER — Emergency Department (HOSPITAL_COMMUNITY)
Admission: EM | Admit: 2022-03-15 | Discharge: 2022-03-15 | Disposition: A | Discharge status: Home / Self Care | Payer: Medicare HMO | Attending: Emergency Medicine | Admitting: Emergency Medicine

## 2022-03-15 DIAGNOSIS — R1084 Generalized abdominal pain: Secondary | ICD-10-CM | POA: Diagnosis not present

## 2022-03-15 DIAGNOSIS — R112 Nausea with vomiting, unspecified: Secondary | ICD-10-CM | POA: Diagnosis not present

## 2022-03-15 DIAGNOSIS — Z940 Kidney transplant status: Secondary | ICD-10-CM | POA: Diagnosis not present

## 2022-03-15 DIAGNOSIS — Z7901 Long term (current) use of anticoagulants: Secondary | ICD-10-CM | POA: Insufficient documentation

## 2022-03-15 DIAGNOSIS — R109 Unspecified abdominal pain: Secondary | ICD-10-CM

## 2022-03-15 DIAGNOSIS — N189 Chronic kidney disease, unspecified: Secondary | ICD-10-CM | POA: Diagnosis not present

## 2022-03-15 DIAGNOSIS — N1330 Unspecified hydronephrosis: Secondary | ICD-10-CM | POA: Diagnosis not present

## 2022-03-15 DIAGNOSIS — I129 Hypertensive chronic kidney disease with stage 1 through stage 4 chronic kidney disease, or unspecified chronic kidney disease: Secondary | ICD-10-CM | POA: Diagnosis not present

## 2022-03-15 LAB — URINALYSIS, ROUTINE W REFLEX MICROSCOPIC
Bilirubin Urine: NEGATIVE
Glucose, UA: 150 mg/dL — AB
Ketones, ur: NEGATIVE mg/dL
Leukocytes,Ua: NEGATIVE
Nitrite: NEGATIVE
Protein, ur: NEGATIVE mg/dL
Specific Gravity, Urine: 1.008 (ref 1.005–1.030)
pH: 8 (ref 5.0–8.0)

## 2022-03-15 LAB — CBC WITH DIFFERENTIAL/PLATELET
Abs Immature Granulocytes: 0.39 10*3/uL — ABNORMAL HIGH (ref 0.00–0.07)
Basophils Absolute: 0 10*3/uL (ref 0.0–0.1)
Basophils Relative: 0 %
Eosinophils Absolute: 0 10*3/uL (ref 0.0–0.5)
Eosinophils Relative: 0 %
HCT: 34.5 % — ABNORMAL LOW (ref 39.0–52.0)
Hemoglobin: 11.7 g/dL — ABNORMAL LOW (ref 13.0–17.0)
Immature Granulocytes: 5 %
Lymphocytes Relative: 0 %
Lymphs Abs: 0 10*3/uL — ABNORMAL LOW (ref 0.7–4.0)
MCH: 33.8 pg (ref 26.0–34.0)
MCHC: 33.9 g/dL (ref 30.0–36.0)
MCV: 99.7 fL (ref 80.0–100.0)
Monocytes Absolute: 0.2 10*3/uL (ref 0.1–1.0)
Monocytes Relative: 3 %
Neutro Abs: 6.7 10*3/uL (ref 1.7–7.7)
Neutrophils Relative %: 92 %
Platelets: 170 10*3/uL (ref 150–400)
RBC: 3.46 MIL/uL — ABNORMAL LOW (ref 4.22–5.81)
RDW: 15.9 % — ABNORMAL HIGH (ref 11.5–15.5)
WBC: 7.4 10*3/uL (ref 4.0–10.5)
nRBC: 0 % (ref 0.0–0.2)

## 2022-03-15 LAB — COMPREHENSIVE METABOLIC PANEL
ALT: 31 U/L (ref 0–44)
AST: 25 U/L (ref 15–41)
Albumin: 4.2 g/dL (ref 3.5–5.0)
Alkaline Phosphatase: 114 U/L (ref 38–126)
Anion gap: 7 (ref 5–15)
BUN: 14 mg/dL (ref 6–20)
CO2: 23 mmol/L (ref 22–32)
Calcium: 9.6 mg/dL (ref 8.9–10.3)
Chloride: 112 mmol/L — ABNORMAL HIGH (ref 98–111)
Creatinine, Ser: 1.56 mg/dL — ABNORMAL HIGH (ref 0.61–1.24)
GFR, Estimated: 53 mL/min — ABNORMAL LOW (ref >60)
Glucose, Bld: 169 mg/dL — ABNORMAL HIGH (ref 70–99)
Potassium: 3.7 mmol/L (ref 3.5–5.1)
Sodium: 142 mmol/L (ref 135–145)
Total Bilirubin: 0.8 mg/dL (ref 0.3–1.2)
Total Protein: 7.2 g/dL (ref 6.5–8.1)

## 2022-03-15 LAB — LIPASE, BLOOD: Lipase: 27 U/L (ref 11–51)

## 2022-03-15 MED ORDER — SODIUM CHLORIDE 0.9 % IV BOLUS
500.0000 mL | Freq: Once | INTRAVENOUS | Status: AC
  Administered 2022-03-15: 500 mL via INTRAVENOUS

## 2022-03-15 MED ORDER — OXYCODONE HCL 5 MG PO TABS: 5.0000 mg | ORAL_TABLET | ORAL | 0 refills | Status: DC | PRN

## 2022-03-15 MED ORDER — HYDROMORPHONE HCL 1 MG/ML IJ SOLN
1.0000 mg | Freq: Once | INTRAMUSCULAR | Status: AC
  Administered 2022-03-15: 1 mg via INTRAVENOUS
  Filled 2022-03-15: qty 1

## 2022-03-15 MED ORDER — ONDANSETRON HCL 4 MG/2ML IJ SOLN
4.0000 mg | Freq: Once | INTRAMUSCULAR | Status: AC
  Administered 2022-03-15: 4 mg via INTRAVENOUS
  Filled 2022-03-15: qty 2

## 2022-03-15 NOTE — Discharge Instructions (Addendum)
You have a seroma next to your kidney which is expected after surgery.  You can take oxycodone as needed for pain  If you have severe pain or fever you will need to return to the ER right away.  Otherwise please follow-up with your transplant surgeon.

## 2022-03-15 NOTE — ED Triage Notes (Signed)
Per EMS- Patient c/o generalized abdominal pain, N/V x 12 hours.  Patient reports that he had a right kidney transplant in April 2023. Patient told EMS that he has pain meds, but did not want to take because "it jacks my stomach up."

## 2022-03-15 NOTE — ED Notes (Signed)
Right arm restriction, patient has fistula.

## 2022-03-15 NOTE — ED Provider Notes (Signed)
[place] DEPT Provider Note   CSN: [id number] Arrival date & time: 03/15/22  1851     History  Chief Complaint  Patient presents with   Abdominal Pain   Emesis    TYJON BOWEN Sr. is a 51 y.o. male history of kidney transplant, here presenting with abdominal pain and vomiting.  Patient states that he had a kidney transplant 2 months ago at [facility].  Since then he had intermittent cramps.  He states that he saw [facility] yesterday.  He was told he had some seroma around the kidney.  He states that he has these episodes where he will crunch up in pain.  He also has some vomiting as well.   The history is provided by the patient.       Home Medications Prior to Admission medications   Medication Sig Start Date End Date Taking? Authorizing Provider  Accu-Chek Softclix Lancets lancets Use as instructed 08/21/20   Philemon Kingdom, MD  acetaminophen (TYLENOL) 500 MG tablet Take 1 tablet (500 mg total) by mouth every 6 (six) hours as needed for moderate pain or mild pain. 12/02/21   Argentina Donovan, PA-C  Alcohol Swabs (B-D SINGLE USE SWABS REGULAR) PADS Use as directed. 01/09/20   [name], MD  apixaban (ELIQUIS) 5 MG TABS tablet Take 2 pills (10 Mg)  twice a day for 7 days then 5 mg ( 1 pill ) twice a day indefinitely 02/07/22   [provider]  atorvastatin (LIPITOR) 40 MG tablet Take 1 tablet (40 mg total) by mouth daily. 07/27/21   [name], MD  Blood Glucose Calibration (MYGLUCOHEALTH CONTROL) SOLN 1 application. by Misc.(Non-Drug; Combo Route) route once a week. Use to check that meter is working properly once weekly. 01/10/22   [provider]  Blood Glucose Monitoring Suppl (ACCU-CHEK GUIDE ME) w/Device KIT Use 3 times daily before meals 08/21/20   Philemon Kingdom, MD  carvedilol (COREG) 12.5 MG tablet Take by mouth. 02/03/22   [provider]  cinacalcet (SENSIPAR) 90 MG tablet Take 90 mg by mouth  daily. 02/09/21   [provider]  diclofenac Sodium (VOLTAREN) 1 % GEL Apply 4 g topically 4 (four) times daily. 05/13/21   Gatha Mayer, MD  Doxercalciferol ([place] IV) Dialysis 06/14/21 06/13/22  [provider]  furosemide (LASIX) 80 MG tablet Take 1 tablet (80 mg total) by mouth 2 (two) times daily. On the days you DO NOT have dialysis 12/02/21   Argentina Donovan, PA-C  glipiZIDE (GLUCOTROL) 5 MG tablet Take 2.5 mg by mouth daily. 09/24/21   [provider]  glucose blood (ACCU-CHEK GUIDE) test strip Use as instructed 08/21/20   Philemon Kingdom, MD  heparin 10000 unit/mL SOLN Dialysis 06/18/21 06/17/22  [provider]  IRON SUCROSE IV Iron Sucrose (Venofer) 02/24/21 03/17/21  [provider]  levothyroxine (SYNTHROID) 137 MCG tablet Take 1 tablet (137 mcg total) by mouth daily before breakfast. 12/02/21   Argentina Donovan, PA-C  mycophenolate (MYFORTIC) 180 MG EC tablet Take by mouth. 02/15/22   [provider]  ondansetron (ZOFRAN ODT) 4 MG disintegrating tablet 49m ODT q4 hours prn nausea/vomit Patient taking differently: Take 4 mg by mouth every 4 (four) hours as needed for vomiting or nausea. 07/19/21   FJacqlyn Larsen PA-C  oxyCODONE (OXY IR/ROXICODONE) 5 MG immediate release tablet Take 1 tablet (5 mg total) by mouth every 4 (four) hours as needed for severe pain. 05/13/21  Gatha Mayer, MD  pantoprazole (PROTONIX) 40 MG tablet Take 1 tablet (40 mg total) by mouth daily. 12/02/21   Argentina Donovan, PA-C  pantoprazole (PROTONIX) 40 MG tablet Take 1 tablet by mouth daily. 01/10/22   [provider]  polyethylene glycol powder (GLYCOLAX/MIRALAX) 17 GM/SCOOP powder Take by mouth. 01/10/22   [provider]  promethazine (PHENERGAN) 25 MG tablet Take 1 tablet (25 mg total) by mouth every 6 (six) hours as needed for nausea or vomiting. 05/13/21   Gatha Mayer, MD  senna-docusate (SENOKOT-S) 8.6-50 MG tablet Take by mouth.  01/10/22   [provider]  SENSIPAR 30 MG tablet Take 30 mg by mouth daily. 09/22/20   [provider]  sevelamer carbonate (RENVELA) 800 MG tablet Take 1 tablet (800 mg total) by mouth 3 (three) times daily with meals. 05/29/20   Daisy Floro, DO  sildenafil (VIAGRA) 50 MG tablet Take 2 tablets (100 mg total) by mouth daily as needed for erectile dysfunction. 12/02/21   Argentina Donovan, PA-C  sulfamethoxazole-trimethoprim (BACTRIM) 400-80 MG tablet Take 1 tablet by mouth 3 (three) times a week. 02/18/22   [provider]  tacrolimus (PROGRAF) 1 MG capsule Take by mouth. 02/18/22   [provider]  valGANciclovir (VALCYTE) 450 MG tablet Take 450 mg by mouth daily. 02/07/22   [provider]  VELPHORO 500 MG chewable tablet Chew 1,000 mg by mouth 3 (three) times daily. 10/08/21   [provider]      Allergies    Patient has no known allergies.    Review of Systems   Review of Systems  Gastrointestinal:  Positive for abdominal pain and vomiting.  All other systems reviewed and are negative.   Physical Exam Updated Vital Signs BP (!) 172/93   Pulse 73   Temp 99.5 F (37.5 C) (Oral)   Resp 20   Ht 5' 11"  (1.803 m)   Wt 83.3 kg   SpO2 96%   BMI 25.61 kg/m  Physical Exam Vitals and nursing note reviewed.  Constitutional:      Comments: Uncomfortable  HENT:     Head: Normocephalic.     Mouth/Throat:     Mouth: Mucous membranes are moist.  Eyes:     Extraocular Movements: Extraocular movements intact.     Pupils: Pupils are equal, round, and reactive to light.  Cardiovascular:     Rate and Rhythm: Normal rate and regular rhythm.  Abdominal:     General: Abdomen is flat.     Comments: Mild diffuse lower abdominal tenderness.  Patient's surgical scar is healing well  Skin:    General: Skin is warm.     Capillary Refill: Capillary refill takes less than 2 seconds.  Neurological:     General: No focal deficit present.      Mental Status: He is oriented to person, place, and time.  Psychiatric:        Mood and Affect: Mood normal.        Behavior: Behavior normal.     ED Results / Procedures / Treatments   Labs (all labs ordered are listed, but only abnormal results are displayed) Labs Reviewed  CBC WITH DIFFERENTIAL/PLATELET - Abnormal; Notable for the following components:      Result Value   RBC 3.46 (*)    Hemoglobin 11.7 (*)    HCT 34.5 (*)    RDW 15.9 (*)    Lymphs Abs 0.0 (*)    Abs Immature  Granulocytes 0.39 (*)    All other components within normal limits  COMPREHENSIVE METABOLIC PANEL - Abnormal; Notable for the following components:   Chloride 112 (*)    Glucose, Bld 169 (*)    Creatinine, Ser 1.56 (*)    GFR, Estimated 53 (*)    All other components within normal limits  URINALYSIS, ROUTINE W REFLEX MICROSCOPIC - Abnormal; Notable for the following components:   Color, Urine STRAW (*)    Glucose, UA 150 (*)    Hgb urine dipstick SMALL (*)    Bacteria, UA RARE (*)    All other components within normal limits  LIPASE, BLOOD    EKG None  Radiology CT ABDOMEN PELVIS WO CONTRAST  Result Date: 03/15/2022 CLINICAL DATA:  Nausea, vomiting EXAM: CT ABDOMEN AND PELVIS WITHOUT CONTRAST TECHNIQUE: Multidetector CT imaging of the abdomen and pelvis was performed following the standard protocol without IV contrast. RADIATION DOSE REDUCTION: This exam was performed according to the departmental dose-optimization program which includes automated exposure control, adjustment of the mA and/or kV according to patient size and/or use of iterative reconstruction technique. COMPARISON:  01/10/2022 FINDINGS: Lower chest: Small pericardial effusion, trace pleural effusions. Hepatobiliary: No focal hepatic abnormality. Gallbladder unremarkable. Pancreas: No focal abnormality or ductal dilatation. Spleen: No focal abnormality.  Normal size. Adrenals/Urinary Tract: Native kidneys are atrophic with bilateral  perinephric stranding. No hydronephrosis. Right lower quadrant renal transplant noted. Mild hydronephrosis. Previously seen ureteral stent and surrounding perinephric drains have been removed. Possible small fluid collection inferior to the renal transplant measuring approximately 3 cm. Adrenal glands and urinary bladder unremarkable. Stomach/Bowel: Stomach, large and small bowel grossly unremarkable. Vascular/Lymphatic: No evidence of aneurysm or adenopathy. Reproductive: No visible focal abnormality. Other: No free fluid or free air. Musculoskeletal: No acute bony abnormality. IMPRESSION: Right lower quadrant renal transplant noted. Interval removal of the ureteral stent seen on prior outside study. Mild right hydronephrosis now present. Small perinephric fluid collection inferior to the renal transplant measuring approximately 3 cm. This likely reflects postoperative fluid collection such as hematoma or seroma. Electronically Signed   By: Rolm Baptise M.D.   On: 03/15/2022 21:21    Procedures Procedures    Medications Ordered in ED Medications  sodium chloride 0.9 % bolus 500 mL (500 mLs Intravenous New Bag/Given 03/15/22 2045)  ondansetron (ZOFRAN) injection 4 mg (4 mg Intravenous Given 03/15/22 2044)  HYDROmorphone (DILAUDID) injection 1 mg (1 mg Intravenous Given 03/15/22 2044)    ED Course/ Medical Decision Making/ A&P                           Medical Decision Making BOBBIE VALLETTA Sr. is a 51 y.o. male here presenting with abdominal pain and nausea vomiting.  Patient has a transplanted kidney.  Concern for possible pyelonephritis versus SBO.  Plan to get CBC and CMP and UA and CT abdomen pelvis  9:48 PM I reviewed patient's labs and independently reviewed imaging study.  Patient's creatinine is 1.5 which is baseline.  Urinalysis showed no UTI.  CT showed mild right hydro and small fluid collection around the kidney consistent with postop seroma.  Patient is afebrile.  White blood cell  count is normal.  I do not think patient have an abscess or a kidney infection.  I offered to have him transfer to [facility] but he states that he has follow-up next week with his transplant surgeon.  He does not want to be admitted right  now.  He does have several Percocets left at home and would like some more pain medicine to go home with.  I told him if his symptoms are worsening, he needs to return to the ER.    Problems Addressed: Abdominal pain, unspecified abdominal location: acute illness or injury Transplanted kidney: acute illness or injury  Amount and/or Complexity of Data Reviewed Labs: ordered. Decision-making details documented in ED Course. Radiology: ordered and independent interpretation performed. Decision-making details documented in ED Course.  Risk Prescription drug management.    Final Clinical Impression(s) / ED Diagnoses Final diagnoses:  None    Rx / DC Orders ED Discharge Orders     None         Drenda Freeze, MD 03/15/22 2151

## 2022-03-17 DIAGNOSIS — R000 Tachycardia, unspecified: Secondary | ICD-10-CM | POA: Diagnosis not present

## 2022-03-17 DIAGNOSIS — Z940 Kidney transplant status: Secondary | ICD-10-CM | POA: Diagnosis not present

## 2022-03-17 DIAGNOSIS — F1290 Cannabis use, unspecified, uncomplicated: Secondary | ICD-10-CM | POA: Diagnosis not present

## 2022-03-17 DIAGNOSIS — R0689 Other abnormalities of breathing: Secondary | ICD-10-CM | POA: Diagnosis not present

## 2022-03-17 DIAGNOSIS — R52 Pain, unspecified: Secondary | ICD-10-CM | POA: Diagnosis not present

## 2022-03-17 DIAGNOSIS — R109 Unspecified abdominal pain: Secondary | ICD-10-CM | POA: Diagnosis not present

## 2022-03-17 DIAGNOSIS — R1084 Generalized abdominal pain: Secondary | ICD-10-CM | POA: Diagnosis not present

## 2022-03-17 DIAGNOSIS — I10 Essential (primary) hypertension: Secondary | ICD-10-CM | POA: Diagnosis not present

## 2022-03-19 ENCOUNTER — Emergency Department (HOSPITAL_COMMUNITY)
Admission: EM | Admit: 2022-03-19 | Discharge: 2022-03-19 | Disposition: A | Discharge status: Home / Self Care | Payer: Medicare HMO | Attending: Emergency Medicine | Admitting: Emergency Medicine

## 2022-03-19 ENCOUNTER — Encounter (HOSPITAL_COMMUNITY): Payer: Self-pay

## 2022-03-19 ENCOUNTER — Other Ambulatory Visit: Payer: Self-pay

## 2022-03-19 DIAGNOSIS — R112 Nausea with vomiting, unspecified: Secondary | ICD-10-CM | POA: Insufficient documentation

## 2022-03-19 DIAGNOSIS — Z7901 Long term (current) use of anticoagulants: Secondary | ICD-10-CM | POA: Insufficient documentation

## 2022-03-19 DIAGNOSIS — R7989 Other specified abnormal findings of blood chemistry: Secondary | ICD-10-CM | POA: Insufficient documentation

## 2022-03-19 DIAGNOSIS — I10 Essential (primary) hypertension: Secondary | ICD-10-CM | POA: Diagnosis not present

## 2022-03-19 DIAGNOSIS — R1084 Generalized abdominal pain: Secondary | ICD-10-CM | POA: Diagnosis not present

## 2022-03-19 DIAGNOSIS — R197 Diarrhea, unspecified: Secondary | ICD-10-CM | POA: Diagnosis not present

## 2022-03-19 DIAGNOSIS — R066 Hiccough: Secondary | ICD-10-CM | POA: Insufficient documentation

## 2022-03-19 LAB — LIPASE, BLOOD: Lipase: 33 U/L (ref 11–51)

## 2022-03-19 LAB — COMPREHENSIVE METABOLIC PANEL
ALT: 22 U/L (ref 0–44)
AST: 27 U/L (ref 15–41)
Albumin: 5 g/dL (ref 3.5–5.0)
Alkaline Phosphatase: 108 U/L (ref 38–126)
Anion gap: 10 (ref 5–15)
BUN: 22 mg/dL — ABNORMAL HIGH (ref 6–20)
CO2: 25 mmol/L (ref 22–32)
Calcium: 10 mg/dL (ref 8.9–10.3)
Chloride: 101 mmol/L (ref 98–111)
Creatinine, Ser: 1.66 mg/dL — ABNORMAL HIGH (ref 0.61–1.24)
GFR, Estimated: 50 mL/min — ABNORMAL LOW (ref >60)
Glucose, Bld: 143 mg/dL — ABNORMAL HIGH (ref 70–99)
Potassium: 3.5 mmol/L (ref 3.5–5.1)
Sodium: 136 mmol/L (ref 135–145)
Total Bilirubin: 1.4 mg/dL — ABNORMAL HIGH (ref 0.3–1.2)
Total Protein: 8.6 g/dL — ABNORMAL HIGH (ref 6.5–8.1)

## 2022-03-19 LAB — CBC
HCT: 41.4 % (ref 39.0–52.0)
Hemoglobin: 14.7 g/dL (ref 13.0–17.0)
MCH: 33.9 pg (ref 26.0–34.0)
MCHC: 35.5 g/dL (ref 30.0–36.0)
MCV: 95.6 fL (ref 80.0–100.0)
Platelets: 214 10*3/uL (ref 150–400)
RBC: 4.33 MIL/uL (ref 4.22–5.81)
RDW: 14.6 % (ref 11.5–15.5)
WBC: 8.8 10*3/uL (ref 4.0–10.5)
nRBC: 0 % (ref 0.0–0.2)

## 2022-03-19 LAB — LACTIC ACID, PLASMA: Lactic Acid, Venous: 1.7 mmol/L (ref 0.5–1.9)

## 2022-03-19 MED ORDER — DROPERIDOL 2.5 MG/ML IJ SOLN
2.5000 mg | Freq: Once | INTRAMUSCULAR | Status: AC
  Administered 2022-03-19: 2.5 mg via INTRAVENOUS
  Filled 2022-03-19: qty 2

## 2022-03-19 MED ORDER — SODIUM CHLORIDE 0.9 % IV BOLUS
1000.0000 mL | Freq: Once | INTRAVENOUS | Status: AC
  Administered 2022-03-19: 1000 mL via INTRAVENOUS

## 2022-03-19 MED ORDER — HYDROMORPHONE HCL 1 MG/ML IJ SOLN
1.0000 mg | Freq: Once | INTRAMUSCULAR | Status: AC
  Administered 2022-03-19: 1 mg via INTRAVENOUS
  Filled 2022-03-19: qty 1

## 2022-03-19 MED ORDER — METOCLOPRAMIDE HCL 10 MG PO TABS: 10.0000 mg | ORAL_TABLET | Freq: Four times a day (QID) | ORAL | 0 refills | Status: DC

## 2022-03-19 MED ORDER — FAMOTIDINE IN NACL 20-0.9 MG/50ML-% IV SOLN
20.0000 mg | Freq: Once | INTRAVENOUS | Status: AC
  Administered 2022-03-19: 20 mg via INTRAVENOUS
  Filled 2022-03-19: qty 50

## 2022-03-19 MED ORDER — METOCLOPRAMIDE HCL 5 MG/ML IJ SOLN
10.0000 mg | Freq: Once | INTRAMUSCULAR | Status: AC
  Administered 2022-03-19: 10 mg via INTRAVENOUS
  Filled 2022-03-19: qty 2

## 2022-03-19 NOTE — Discharge Instructions (Signed)
Use prescribed Reglan every 6 hours for the next 1 to 2 days and then as needed.  To try and help keep control of your symptoms try and make sure you are drinking small amounts of liquid very frequently to keep something on your stomach and trying to eat small amounts of bland foods.  If your stomach is empty for too long sometimes that can cause the symptoms to start occurring once again.  Please avoid using marijuana, this may be causing a cyclical vomiting syndrome causing you to have recurrent episodes of vomiting and pain.  This can continue for up to 6 months after using marijuana.  Please call to schedule close follow-up with your GI doctor and follow-up with your transplant team as planned.  Return for new or worsening symptoms.

## 2022-03-19 NOTE — ED Notes (Signed)
Gave pt water for po challenge per pa request

## 2022-03-19 NOTE — ED Provider Notes (Incomplete)
[place] DEPT Provider Note   CSN: [id number] Arrival date & time: 03/19/22  5643     History {Add pertinent medical, surgical, social history, OB history to HPI:1} Chief Complaint  Patient presents with   Abdominal Pain    Edward VILLACIS Sr. is a 51 y.o. male.  Edward [name] is a 51 y.o. male with hx of recent renal transplant on 4/5, who presents with vomiting, generalized abdominal pain and hiccups for 5 days.  The patient states he has had episodes of pain like this prior to the transplant that occur intermittently for over a year. He states he has been unable to hold down food or fluid 5 days. Does not remember his last bowel movement. Patient states he has been taking the immunosuppressants as prescribed, but is struggling to take it with food and water due to the vomiting. Patient states his symptoms have remained constant for 5 days. Denies fever, chills, diarrhea. Denies urinary symptoms. He was last seen at our facility on Monday for the same complaint and also went to [facility] ED on Thursday. Patient states he last saw his transplant surgeon on June 9. Patient does admit to marijuana use, he states it has been about 1 week since he last used.    Abdominal Pain      Home Medications Prior to Admission medications   Medication Sig Start Date End Date Taking? Authorizing Provider  Accu-Chek Softclix Lancets lancets Use as instructed 08/21/20   Philemon Kingdom, MD  acetaminophen (TYLENOL) 500 MG tablet Take 1 tablet (500 mg total) by mouth every 6 (six) hours as needed for moderate pain or mild pain. 12/02/21   Argentina Donovan, PA-C  Alcohol Swabs (B-D SINGLE USE SWABS REGULAR) PADS Use as directed. 01/09/20   [name], MD  apixaban (ELIQUIS) 5 MG TABS tablet Take 2 pills (10 Mg)  twice a day for 7 days then 5 mg ( 1 pill ) twice a day indefinitely 02/07/22   [provider]  atorvastatin (LIPITOR) 40 MG tablet Take 1  tablet (40 mg total) by mouth daily. 07/27/21   [name], MD  Blood Glucose Calibration (MYGLUCOHEALTH CONTROL) SOLN 1 application. by Misc.(Non-Drug; Combo Route) route once a week. Use to check that meter is working properly once weekly. 01/10/22   [provider]  Blood Glucose Monitoring Suppl (ACCU-CHEK GUIDE ME) w/Device KIT Use 3 times daily before meals 08/21/20   Philemon Kingdom, MD  carvedilol (COREG) 12.5 MG tablet Take by mouth. 02/03/22   [provider]  cinacalcet (SENSIPAR) 90 MG tablet Take 90 mg by mouth daily. 02/09/21   [provider]  diclofenac Sodium (VOLTAREN) 1 % GEL Apply 4 g topically 4 (four) times daily. 05/13/21   Gatha Mayer, MD  Doxercalciferol ([place] IV) Dialysis 06/14/21 06/13/22  [provider]  furosemide (LASIX) 80 MG tablet Take 1 tablet (80 mg total) by mouth 2 (two) times daily. On the days you DO NOT have dialysis 12/02/21   Argentina Donovan, PA-C  glipiZIDE (GLUCOTROL) 5 MG tablet Take 2.5 mg by mouth daily. 09/24/21   [provider]  glucose blood (ACCU-CHEK GUIDE) test strip Use as instructed 08/21/20   Philemon Kingdom, MD  heparin 10000 unit/mL SOLN Dialysis 06/18/21 06/17/22  [provider]  IRON SUCROSE IV Iron Sucrose (Venofer) 02/24/21 03/17/21  [provider]  levothyroxine (SYNTHROID) 137 MCG tablet Take 1 tablet (137 mcg total) by mouth daily before breakfast.  12/02/21   Argentina Donovan, PA-C  mycophenolate (MYFORTIC) 180 MG EC tablet Take by mouth. 02/15/22   [provider]  ondansetron (ZOFRAN ODT) 4 MG disintegrating tablet 51m ODT q4 hours prn nausea/vomit Patient taking differently: Take 4 mg by mouth every 4 (four) hours as needed for vomiting or nausea. 07/19/21   FJacqlyn Larsen PA-C  oxyCODONE (ROXICODONE) 5 MG immediate release tablet Take 1 tablet (5 mg total) by mouth every 4 (four) hours as needed for severe pain. 03/15/22   YDrenda Freeze MD   pantoprazole (PROTONIX) 40 MG tablet Take 1 tablet (40 mg total) by mouth daily. 12/02/21   MArgentina Donovan PA-C  pantoprazole (PROTONIX) 40 MG tablet Take 1 tablet by mouth daily. 01/10/22   [provider]  polyethylene glycol powder (GLYCOLAX/MIRALAX) 17 GM/SCOOP powder Take by mouth. 01/10/22   [provider]  promethazine (PHENERGAN) 25 MG tablet Take 1 tablet (25 mg total) by mouth every 6 (six) hours as needed for nausea or vomiting. 05/13/21   GGatha Mayer MD  senna-docusate (SENOKOT-S) 8.6-50 MG tablet Take by mouth. 01/10/22   [provider]  SENSIPAR 30 MG tablet Take 30 mg by mouth daily. 09/22/20   [provider]  sevelamer carbonate (RENVELA) 800 MG tablet Take 1 tablet (800 mg total) by mouth 3 (three) times daily with meals. 05/29/20   ADaisy Floro DO  sildenafil (VIAGRA) 50 MG tablet Take 2 tablets (100 mg total) by mouth daily as needed for erectile dysfunction. 12/02/21   MArgentina Donovan PA-C  sulfamethoxazole-trimethoprim (BACTRIM) 400-80 MG tablet Take 1 tablet by mouth 3 (three) times a week. 02/18/22   [provider]  tacrolimus (PROGRAF) 1 MG capsule Take by mouth. 02/18/22   [provider]  valGANciclovir (VALCYTE) 450 MG tablet Take 450 mg by mouth daily. 02/07/22   [provider]  VELPHORO 500 MG chewable tablet Chew 1,000 mg by mouth 3 (three) times daily. 10/08/21   [provider]      Allergies    Patient has no known allergies.    Review of Systems   Review of Systems  Gastrointestinal:  Positive for abdominal pain.    Physical Exam Updated Vital Signs BP (!) 153/102   Pulse 80   Temp 98.2 F (36.8 C) (Oral)   Resp 19   Ht 5' 11" (1.803 m)   Wt 83.3 kg   SpO2 100%   BMI 25.61 kg/m  Physical Exam  ED Results / Procedures / Treatments   Labs (all labs ordered are listed, but only abnormal results are displayed) Labs Reviewed  LIPASE, BLOOD  COMPREHENSIVE METABOLIC  PANEL  CBC  URINALYSIS, ROUTINE W REFLEX MICROSCOPIC  LACTIC ACID, PLASMA  LACTIC ACID, PLASMA    EKG None  Radiology No results found.  Procedures Procedures  {Document cardiac monitor, telemetry assessment procedure when appropriate:1}  Medications Ordered in ED Medications  sodium chloride 0.9 % bolus 1,000 mL (has no administration in time range)  metoCLOPramide (REGLAN) injection 10 mg (has no administration in time range)  HYDROmorphone (DILAUDID) injection 1 mg (has no administration in time range)  famotidine (PEPCID) IVPB 20 mg premix (has no administration in time range)    ED Course/ Medical Decision Making/ A&P                           Medical Decision Making Amount and/or Complexity of Data Reviewed Labs:  ordered.  Risk Prescription drug management.   ***  {Document critical care time when appropriate:1} {Document review of labs and clinical decision tools ie heart score, Chads2Vasc2 etc:1}  {Document your independent review of radiology images, and any outside records:1} {Document your discussion with family members, caretakers, and with consultants:1} {Document social determinants of health affecting pt's care:1} {Document your decision making why or why not admission, treatments were needed:1} Final Clinical Impression(s) / ED Diagnoses Final diagnoses:  None    Rx / DC Orders ED Discharge Orders     None

## 2022-03-19 NOTE — ED Triage Notes (Addendum)
BIB [facility] with c/o generalized abd pain and hiccups x 4 days. Recently had kidney transplant on 4/5 at [facility].

## 2022-03-20 ENCOUNTER — Emergency Department (HOSPITAL_COMMUNITY)
Admission: EM | Admit: 2022-03-20 | Discharge: 2022-03-21 | Disposition: A | Discharge status: Home / Self Care | Payer: Medicare HMO | Attending: Emergency Medicine | Admitting: Emergency Medicine

## 2022-03-20 ENCOUNTER — Encounter (HOSPITAL_COMMUNITY): Payer: Self-pay | Admitting: Emergency Medicine

## 2022-03-20 DIAGNOSIS — E1122 Type 2 diabetes mellitus with diabetic chronic kidney disease: Secondary | ICD-10-CM | POA: Diagnosis not present

## 2022-03-20 DIAGNOSIS — I509 Heart failure, unspecified: Secondary | ICD-10-CM | POA: Insufficient documentation

## 2022-03-20 DIAGNOSIS — R1013 Epigastric pain: Secondary | ICD-10-CM | POA: Diagnosis not present

## 2022-03-20 DIAGNOSIS — N189 Chronic kidney disease, unspecified: Secondary | ICD-10-CM | POA: Diagnosis not present

## 2022-03-20 DIAGNOSIS — E876 Hypokalemia: Secondary | ICD-10-CM | POA: Insufficient documentation

## 2022-03-20 DIAGNOSIS — Z8616 Personal history of COVID-19: Secondary | ICD-10-CM | POA: Diagnosis not present

## 2022-03-20 DIAGNOSIS — I130 Hypertensive heart and chronic kidney disease with heart failure and stage 1 through stage 4 chronic kidney disease, or unspecified chronic kidney disease: Secondary | ICD-10-CM | POA: Diagnosis not present

## 2022-03-20 DIAGNOSIS — E039 Hypothyroidism, unspecified: Secondary | ICD-10-CM | POA: Diagnosis not present

## 2022-03-20 DIAGNOSIS — I129 Hypertensive chronic kidney disease with stage 1 through stage 4 chronic kidney disease, or unspecified chronic kidney disease: Secondary | ICD-10-CM | POA: Diagnosis not present

## 2022-03-20 DIAGNOSIS — E860 Dehydration: Secondary | ICD-10-CM | POA: Diagnosis not present

## 2022-03-20 LAB — COMPREHENSIVE METABOLIC PANEL
ALT: 18 U/L (ref 0–44)
AST: 21 U/L (ref 15–41)
Albumin: 4.7 g/dL (ref 3.5–5.0)
Alkaline Phosphatase: 95 U/L (ref 38–126)
Anion gap: 14 (ref 5–15)
BUN: 30 mg/dL — ABNORMAL HIGH (ref 6–20)
CO2: 21 mmol/L — ABNORMAL LOW (ref 22–32)
Calcium: 9.3 mg/dL (ref 8.9–10.3)
Chloride: 103 mmol/L (ref 98–111)
Creatinine, Ser: 2.22 mg/dL — ABNORMAL HIGH (ref 0.61–1.24)
GFR, Estimated: 35 mL/min — ABNORMAL LOW (ref >60)
Glucose, Bld: 162 mg/dL — ABNORMAL HIGH (ref 70–99)
Potassium: 3.1 mmol/L — ABNORMAL LOW (ref 3.5–5.1)
Sodium: 138 mmol/L (ref 135–145)
Total Bilirubin: 1.5 mg/dL — ABNORMAL HIGH (ref 0.3–1.2)
Total Protein: 7.9 g/dL (ref 6.5–8.1)

## 2022-03-20 LAB — LIPASE, BLOOD: Lipase: 34 U/L (ref 11–51)

## 2022-03-20 MED ORDER — HYDROCODONE-ACETAMINOPHEN 5-325 MG PO TABS
1.0000 | ORAL_TABLET | Freq: Once | ORAL | Status: AC
  Administered 2022-03-20: 1 via ORAL
  Filled 2022-03-20: qty 1

## 2022-03-20 MED ORDER — ONDANSETRON 4 MG PO TBDP
4.0000 mg | ORAL_TABLET | Freq: Once | ORAL | Status: AC
  Administered 2022-03-20: 4 mg via ORAL
  Filled 2022-03-20: qty 1

## 2022-03-20 NOTE — ED Notes (Signed)
Was not able to get a lot of blood for the lt green (sent to lab to see if could be tested). Was not able to get the lavender tube.

## 2022-03-20 NOTE — ED Triage Notes (Signed)
Patient c/o abdominal pain with N/V today. Denies diarrhea. Reports taking oxy at home with little relief.

## 2022-03-20 NOTE — ED Provider Triage Note (Signed)
Emergency Medicine Provider Triage Evaluation Note  [name] , a 51 y.o. male  was evaluated in triage.  Pt complains of abdominal pain, persistent nausea and vomiting.  Patient was seen this morning and discharged however he states he has not felt any better.  Has not been able to keep any food or drink down.  Denies fever..  Review of Systems  Positive: As above  Negative: As above  Physical Exam  BP (!) 186/92   Pulse 87   Temp 98.2 F (36.8 C) (Oral)   Resp 20   SpO2 95%  Gen:   Awake, no distress   Resp:  Normal effort  MSK:   Moves extremities without difficulty  Other:    Medical Decision Making  Medically screening exam initiated at 9:32 PM.  Appropriate orders placed.  [name] was informed that the remainder of the evaluation will be completed by another provider, this initial triage assessment does not replace that evaluation, and the importance of remaining in the ED until their evaluation is complete.     Evlyn Courier, PA-C 03/20/22 2133

## 2022-03-21 LAB — CBC
HCT: 35.7 % — ABNORMAL LOW (ref 39.0–52.0)
Hemoglobin: 12.9 g/dL — ABNORMAL LOW (ref 13.0–17.0)
MCH: 34.5 pg — ABNORMAL HIGH (ref 26.0–34.0)
MCHC: 36.1 g/dL — ABNORMAL HIGH (ref 30.0–36.0)
MCV: 95.5 fL (ref 80.0–100.0)
Platelets: 184 10*3/uL (ref 150–400)
RBC: 3.74 MIL/uL — ABNORMAL LOW (ref 4.22–5.81)
RDW: 14.6 % (ref 11.5–15.5)
WBC: 6.2 10*3/uL (ref 4.0–10.5)
nRBC: 0 % (ref 0.0–0.2)

## 2022-03-21 MED ORDER — DROPERIDOL 2.5 MG/ML IJ SOLN
1.2500 mg | Freq: Once | INTRAMUSCULAR | Status: AC
  Administered 2022-03-21: 1.25 mg via INTRAVENOUS
  Filled 2022-03-21: qty 2

## 2022-03-21 MED ORDER — POTASSIUM CHLORIDE CRYS ER 20 MEQ PO TBCR: 40.0000 meq | EXTENDED_RELEASE_TABLET | Freq: Every day | ORAL | 0 refills | Status: DC

## 2022-03-21 MED ORDER — SODIUM CHLORIDE 0.9 % IV BOLUS
500.0000 mL | Freq: Once | INTRAVENOUS | Status: AC
Start: 2022-03-21 — End: 2022-03-21
  Administered 2022-03-21: 500 mL via INTRAVENOUS

## 2022-03-21 MED ORDER — ONDANSETRON 4 MG PO TBDP: 4.0000 mg | ORAL_TABLET | Freq: Three times a day (TID) | ORAL | 0 refills | Status: DC | PRN

## 2022-03-21 NOTE — Discharge Instructions (Signed)
You were seen in the emergency room today with abdominal pain.  I would like for you to follow closely with your kidney doctors because your labs are showing some evidence of dehydration.  They will need to repeat an ultrasound of your kidney and repeat some lab work.  Please reach out to them in the morning or go to the walk-in clinic hours there.

## 2022-03-21 NOTE — ED Provider Notes (Signed)
Emergency Department Provider Note   I have reviewed the triage vital signs and the nursing notes.   HISTORY  Chief Complaint Abdominal Pain   HPI CEPHUS TUPY Sr. is a 51 y.o. male with past medical history reviewed below presents emergency department with epigastric abdominal pain with nausea.  He notes a [place] history of similar pain which she describes occurring intermittently for "years."  He states his pain is familiar to him and denies any unilateral pain specifically in the right lower quadrant or right flank.  He continues to urinate normally.  He has not had fevers.  He is compliant with his home medications.  Notes the last time he used marijuana was approximately 1 week ago.  He does not drink alcohol.  He has been seen multiple times in the emergency department this month including both at this facility and [facility].  [facility] and has been following with that team as an outpatient. Notable history of chronic abdominal pain and question of cannabinoid hyperemesis.  Patient tells me that he did see a GI doctor in the past for this pain but that, in his words, "they gave up" with no clear diagnosis made per patient.    Past Medical History:  Diagnosis Date   Acute on chronic renal failure ([place]) 11/25/2015   Anemia    Arthritis    Blind    Left eye   Cataract    CHF (congestive heart failure) (HCC)    Chronic kidney disease    stage 5   COVID-19    Depression    situatuional   Diabetes mellitus    Type II   GERD (gastroesophageal reflux disease)    Graves disease 2014   Headache    in past   Hx of adenomatous and sessile serrated colonic polyps 11/21/2019   Hyperlipidemia    Hypertension    Hypothyroidism    Legally blind    right eye   Neuropathy    Non-compliance    Proteinuria 05/24/2020   Shortness of breath dyspnea    "Better since I've been taking my medications"    Review of  Systems  Constitutional: No fever/chills Eyes: No visual changes. ENT: No sore throat. Cardiovascular: Denies chest pain. Respiratory: Denies shortness of breath. Gastrointestinal: Positive epigastric abdominal pain. Positive nausea and vomiting.  No diarrhea.  No constipation. Genitourinary: Negative for dysuria. Musculoskeletal: Negative for back pain. Skin: Negative for rash. Neurological: Negative for headaches, focal weakness or numbness.   ____________________________________________   PHYSICAL EXAM:  VITAL SIGNS: ED Triage Vitals  Enc Vitals Group     BP 03/20/22 2116 (!) 186/92     Pulse Rate 03/20/22 2116 87     Resp 03/20/22 2116 20     Temp 03/20/22 2116 98.2 F (36.8 C)     Temp Source 03/20/22 2116 Oral     SpO2 03/20/22 2116 95 %   Constitutional: Alert and oriented. Grimacing in pain but no acute distress and can provide a full history.  Eyes: Conjunctivae are normal.  Head: Atraumatic. Nose: No congestion/rhinnorhea. Mouth/Throat: Mucous membranes are moist.   Neck: No stridor.   Cardiovascular: Normal rate, regular rhythm. Good peripheral circulation. Grossly normal heart sounds.   Respiratory: Normal respiratory effort.  No retractions. Lungs CTAB. Gastrointestinal: Soft and nontender. No distention.  Musculoskeletal: No lower extremity tenderness nor edema. No gross deformities of extremities. Neurologic:  Normal speech and language. No  gross focal neurologic deficits are appreciated.  Skin:  Skin is warm, dry and intact. No rash noted.  ____________________________________________   LABS (all labs ordered are listed, but only abnormal results are displayed)  Labs Reviewed  COMPREHENSIVE METABOLIC PANEL - Abnormal; Notable for the following components:      Result Value   Potassium 3.1 (*)    CO2 21 (*)    Glucose, Bld 162 (*)    BUN 30 (*)    Creatinine, Ser 2.22 (*)    Total Bilirubin 1.5 (*)    GFR, Estimated 35 (*)    All other  components within normal limits  CBC - Abnormal; Notable for the following components:   RBC 3.74 (*)    Hemoglobin 12.9 (*)    HCT 35.7 (*)    MCH 34.5 (*)    MCHC 36.1 (*)    All other components within normal limits  LIPASE, BLOOD  URINALYSIS, ROUTINE W REFLEX MICROSCOPIC    ____________________________________________   PROCEDURES  Procedure(s) performed:   Procedures  None  ____________________________________________   INITIAL IMPRESSION / ASSESSMENT AND PLAN / ED COURSE  Pertinent labs & imaging results that were available during my care of the patient were reviewed by me and considered in my medical decision making (see chart for details).   This patient is Presenting for Evaluation of abdominal pain, which does require a range of treatment options, and is a complaint that involves a high risk of morbidity and mortality.  The Differential Diagnoses includes but is not exclusive to acute cholecystitis, intrathoracic causes for epigastric abdominal pain, gastritis, duodenitis, pancreatitis, small bowel or large bowel obstruction, abdominal aortic aneurysm, hernia, gastritis, etc.   Critical Interventions-    Medications  ondansetron (ZOFRAN-ODT) disintegrating tablet 4 mg (4 mg Oral Given 03/20/22 2138)  HYDROcodone-acetaminophen (NORCO/VICODIN) 5-325 MG per tablet 1 tablet (1 tablet Oral Given 03/20/22 2138)  droperidol (INAPSINE) 2.5 MG/ML injection 1.25 mg (1.25 mg Intravenous Given 03/21/22 0022)  sodium chloride 0.9 % bolus 500 mL (0 mLs Intravenous Stopped 03/21/22 0110)    Reassessment after intervention: Symptoms improved.    I decided to review pertinent External Data, and in summary patient seen in our ED x 2 in the last week and the [facility] ED. CT abdomen pelvis without contrast performed on 6/13 showing mild right hydronephrosis and small fluid collection which has been noted on prior imaging and commented on by the patient's nephrology team at [facility].     Clinical Laboratory Tests Ordered, included creatinine of 2.22.  3.1 potassium. Lipase normal.  Labs in the [facility] system from 6/15 showed creatinine of 1.71.   Radiologic Tests: Considered repeat CT imaging or ultrasound but patient had noncontrast CT 6 days prior and the location of his pain is not near the transplant site.   Cardiac Monitor Tracing which shows NSR.    Social Determinants of Health Risk positive marijuana use.   Consult complete with transplant team at [facility].  I spoke with the patient's kidney transplant physician to review the case and the very mild increase in his creatinine down to 2.22.  Plan for IV fluids and they will plan to reach out to him and see him tomorrow morning in the clinic for repeat ultrasound and follow-up.  Agree this does not seem consistent with rejection and does not require transfer/admit.   Medical Decision Making: Summary:  Patient presents to the emergency department for evaluation of epigastric abdominal pain along with nausea vomiting.  He describes this as pain similar to chronic pain he is experienced for years prior to the transplant.  His creatinine is at 2.22 slightly elevated from some prior values.  Plan for gentle IV fluid bolus and droperidol.  He was given some Vicodin in triage but will try and limit opiate medications in the setting of what appears to be more chronic abdominal pain.   Reevaluation with update and discussion with patient.  Discussed plan to go to open clinic hours/call his nephrology team in the morning.  Considered admission but discussed labs and presentation with the kidney transplant team at [facility].  Patient has good follow-up.  Plan for discharge.  Disposition: discharge  ____________________________________________  FINAL CLINICAL IMPRESSION(S) / ED DIAGNOSES  Final diagnoses:  Epigastric pain  Dehydration     NEW OUTPATIENT MEDICATIONS STARTED DURING THIS VISIT:  New Prescriptions    ONDANSETRON (ZOFRAN-ODT) 4 MG DISINTEGRATING TABLET    Take 1 tablet (4 mg total) by mouth every 8 (eight) hours as needed for nausea or vomiting.   POTASSIUM CHLORIDE SA (KLOR-CON M) 20 MEQ TABLET    Take 2 tablets (40 mEq total) by mouth daily for 3 days.    Note:  This document was prepared using Dragon voice recognition software and may include unintentional dictation errors.  Nanda Quinton, MD, [facility] Emergency Medicine    Nyshaun Standage, [facility], MD 03/21/22 [phone]

## 2022-03-21 NOTE — ED Notes (Signed)
Pt advises being unable to provide urine sample at this time, will monitor. 

## 2022-03-21 NOTE — ED Notes (Signed)
I provided reinforced discharge education based off of discharge instructions. Pt acknowledged and understood my education. Pt had no further questions/concerns for provider/myself.  °

## 2022-03-21 NOTE — ED Notes (Signed)
Lab called to add on lavender tube.

## 2022-03-22 DIAGNOSIS — I82513 Chronic embolism and thrombosis of femoral vein, bilateral: Secondary | ICD-10-CM | POA: Diagnosis not present

## 2022-03-22 DIAGNOSIS — Z4822 Encounter for aftercare following kidney transplant: Secondary | ICD-10-CM | POA: Diagnosis not present

## 2022-03-22 DIAGNOSIS — Z79621 Long term (current) use of calcineurin inhibitor: Secondary | ICD-10-CM | POA: Diagnosis not present

## 2022-03-22 DIAGNOSIS — E1121 Type 2 diabetes mellitus with diabetic nephropathy: Secondary | ICD-10-CM | POA: Diagnosis not present

## 2022-03-22 DIAGNOSIS — G8929 Other chronic pain: Secondary | ICD-10-CM | POA: Diagnosis not present

## 2022-03-22 DIAGNOSIS — R109 Unspecified abdominal pain: Secondary | ICD-10-CM | POA: Diagnosis not present

## 2022-03-22 DIAGNOSIS — Z940 Kidney transplant status: Secondary | ICD-10-CM | POA: Diagnosis not present

## 2022-03-22 DIAGNOSIS — R1011 Right upper quadrant pain: Secondary | ICD-10-CM | POA: Diagnosis not present

## 2022-03-22 DIAGNOSIS — E119 Type 2 diabetes mellitus without complications: Secondary | ICD-10-CM | POA: Diagnosis not present

## 2022-03-22 DIAGNOSIS — I151 Hypertension secondary to other renal disorders: Secondary | ICD-10-CM | POA: Diagnosis not present

## 2022-03-22 DIAGNOSIS — D649 Anemia, unspecified: Secondary | ICD-10-CM | POA: Diagnosis not present

## 2022-03-22 DIAGNOSIS — Z5181 Encounter for therapeutic drug level monitoring: Secondary | ICD-10-CM | POA: Diagnosis not present

## 2022-03-22 DIAGNOSIS — D849 Immunodeficiency, unspecified: Secondary | ICD-10-CM | POA: Diagnosis not present

## 2022-03-22 DIAGNOSIS — I10 Essential (primary) hypertension: Secondary | ICD-10-CM | POA: Diagnosis not present

## 2022-03-22 DIAGNOSIS — I824Y9 Acute embolism and thrombosis of unspecified deep veins of unspecified proximal lower extremity: Secondary | ICD-10-CM | POA: Diagnosis not present

## 2022-04-04 DIAGNOSIS — Z87891 Personal history of nicotine dependence: Secondary | ICD-10-CM | POA: Diagnosis not present

## 2022-04-04 DIAGNOSIS — N186 End stage renal disease: Secondary | ICD-10-CM | POA: Diagnosis not present

## 2022-04-04 DIAGNOSIS — I120 Hypertensive chronic kidney disease with stage 5 chronic kidney disease or end stage renal disease: Secondary | ICD-10-CM | POA: Diagnosis not present

## 2022-04-04 DIAGNOSIS — Z5181 Encounter for therapeutic drug level monitoring: Secondary | ICD-10-CM | POA: Diagnosis not present

## 2022-04-04 DIAGNOSIS — Z79621 Long term (current) use of calcineurin inhibitor: Secondary | ICD-10-CM | POA: Diagnosis not present

## 2022-04-04 DIAGNOSIS — R109 Unspecified abdominal pain: Secondary | ICD-10-CM | POA: Diagnosis not present

## 2022-04-04 DIAGNOSIS — Z7952 Long term (current) use of systemic steroids: Secondary | ICD-10-CM | POA: Diagnosis not present

## 2022-04-04 DIAGNOSIS — Z7901 Long term (current) use of anticoagulants: Secondary | ICD-10-CM | POA: Diagnosis not present

## 2022-04-04 DIAGNOSIS — I10 Essential (primary) hypertension: Secondary | ICD-10-CM | POA: Diagnosis not present

## 2022-04-04 DIAGNOSIS — Z79624 Long term (current) use of inhibitors of nucleotide synthesis: Secondary | ICD-10-CM | POA: Diagnosis not present

## 2022-04-04 DIAGNOSIS — Z4822 Encounter for aftercare following kidney transplant: Secondary | ICD-10-CM | POA: Diagnosis not present

## 2022-04-04 DIAGNOSIS — Z792 Long term (current) use of antibiotics: Secondary | ICD-10-CM | POA: Diagnosis not present

## 2022-04-04 DIAGNOSIS — D649 Anemia, unspecified: Secondary | ICD-10-CM | POA: Diagnosis not present

## 2022-04-04 DIAGNOSIS — I82891 Chronic embolism and thrombosis of other specified veins: Secondary | ICD-10-CM | POA: Diagnosis not present

## 2022-04-04 DIAGNOSIS — D8989 Other specified disorders involving the immune mechanism, not elsewhere classified: Secondary | ICD-10-CM | POA: Diagnosis not present

## 2022-04-04 DIAGNOSIS — E119 Type 2 diabetes mellitus without complications: Secondary | ICD-10-CM | POA: Diagnosis not present

## 2022-04-04 DIAGNOSIS — Z940 Kidney transplant status: Secondary | ICD-10-CM | POA: Diagnosis not present

## 2022-04-04 DIAGNOSIS — Z992 Dependence on renal dialysis: Secondary | ICD-10-CM | POA: Diagnosis not present

## 2022-04-04 DIAGNOSIS — I82403 Acute embolism and thrombosis of unspecified deep veins of lower extremity, bilateral: Secondary | ICD-10-CM | POA: Diagnosis not present

## 2022-04-04 DIAGNOSIS — I82512 Chronic embolism and thrombosis of left femoral vein: Secondary | ICD-10-CM | POA: Diagnosis not present

## 2022-04-04 DIAGNOSIS — E1122 Type 2 diabetes mellitus with diabetic chronic kidney disease: Secondary | ICD-10-CM | POA: Diagnosis not present

## 2022-04-10 IMAGING — CR DG CHEST 2V
2 series · 2 of 2 positions shown · non-contrast
Comparison: 09/17/2020

CLINICAL DATA: Chest pain

EXAM:
CHEST - 2 VIEW

[chest pa]
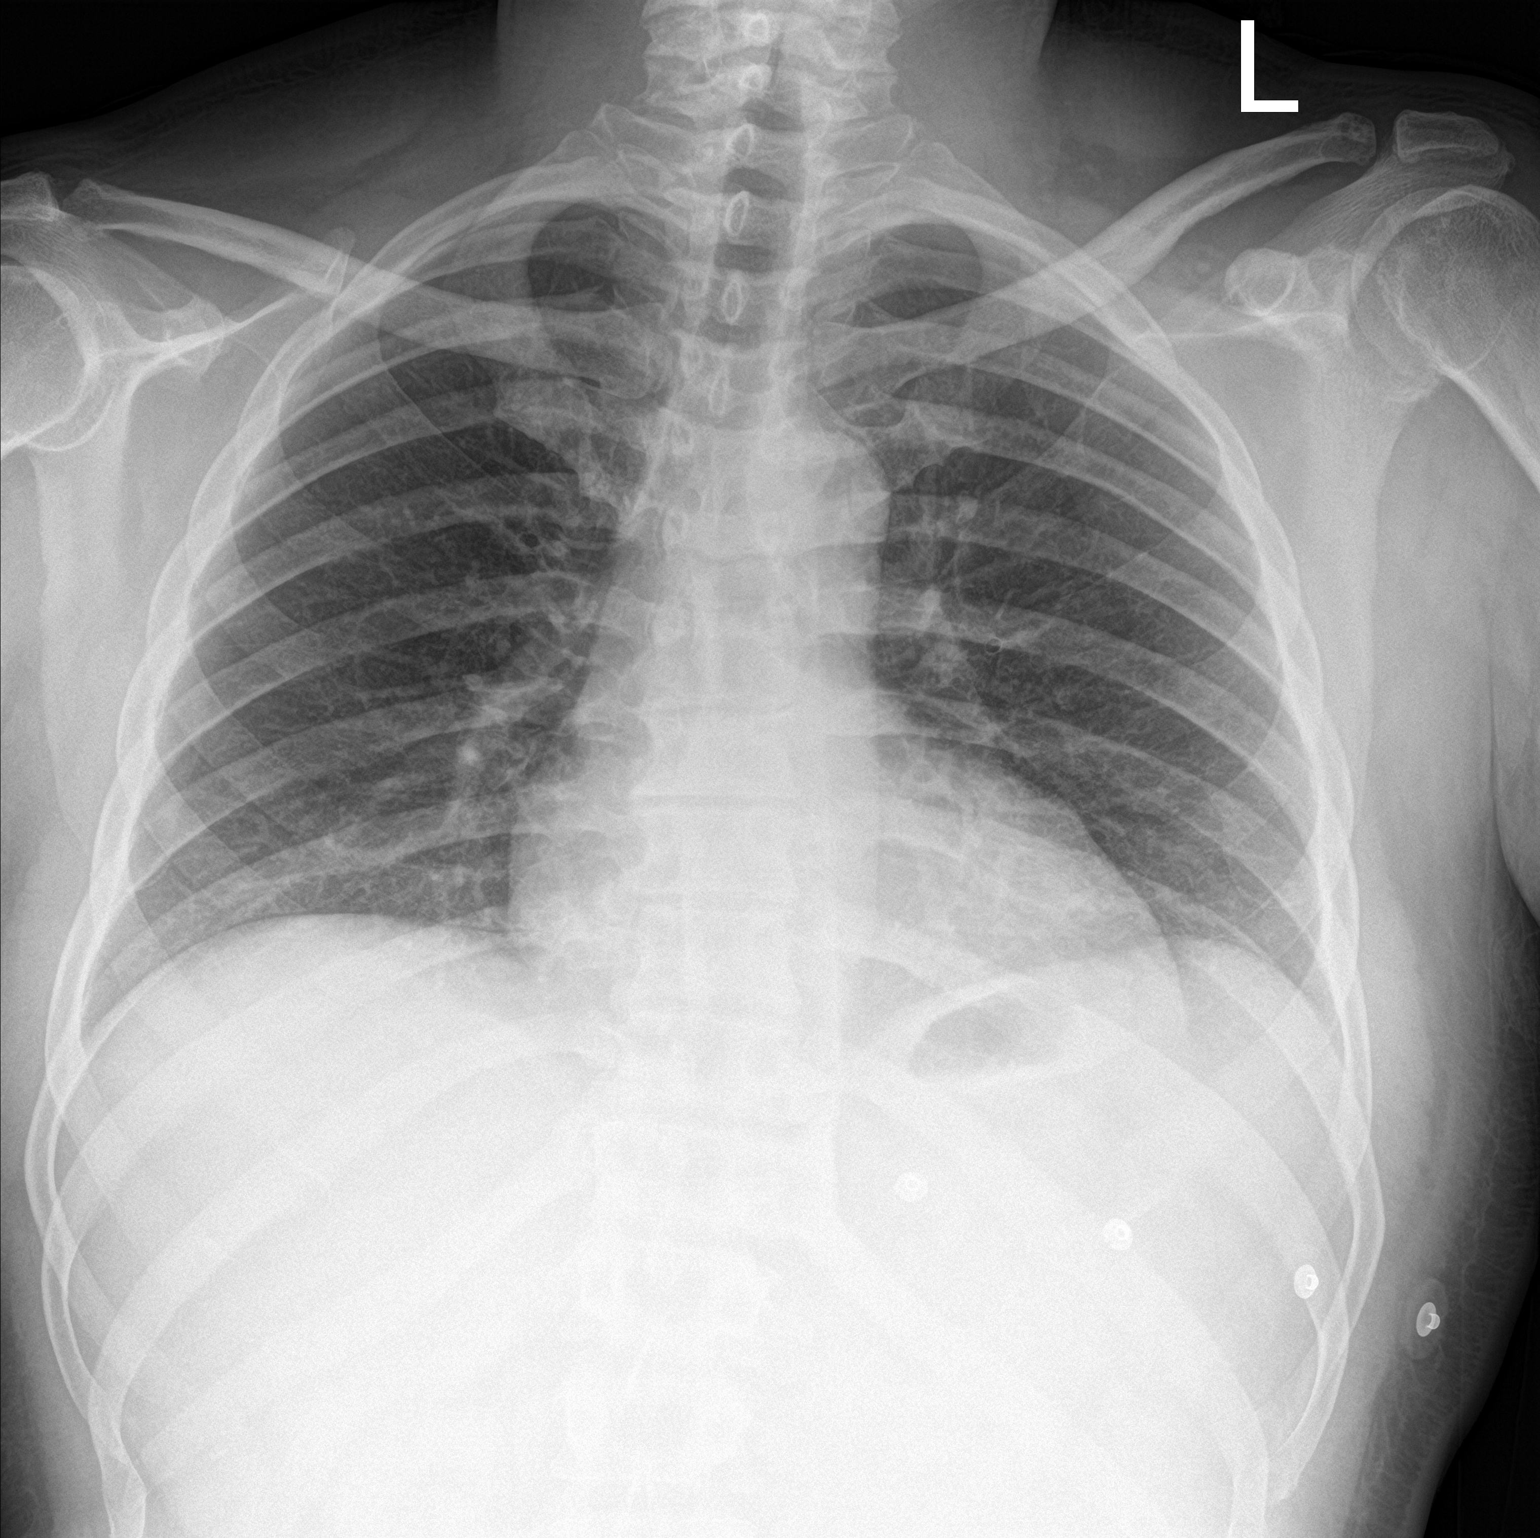

[chest lat]
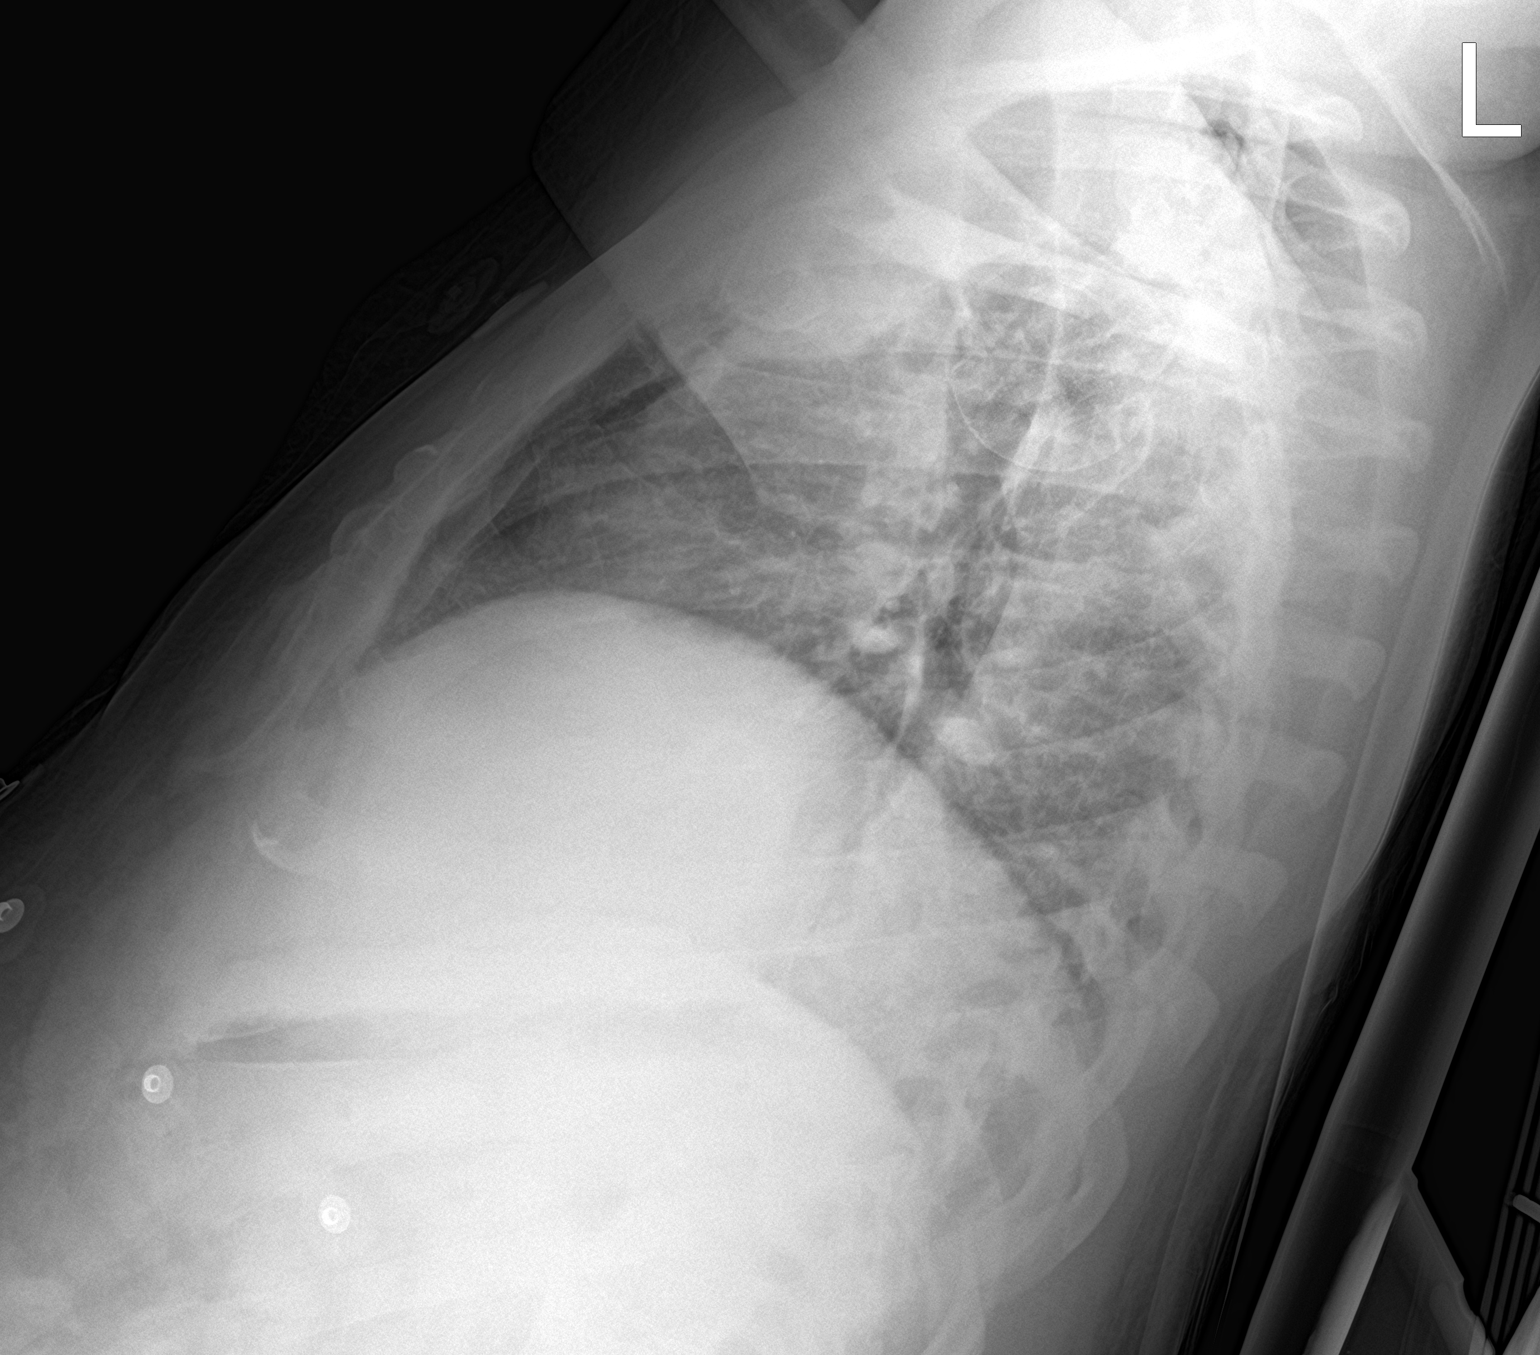

[2 of 2 positions shown; findings below may reference images not displayed]

FINDINGS: Cardiac shadow is within normal limits. The lungs are clear
bilaterally. No bony abnormality is noted.
IMPRESSION: No acute abnormality noted.

## 2022-05-04 DIAGNOSIS — E113593 Type 2 diabetes mellitus with proliferative diabetic retinopathy without macular edema, bilateral: Secondary | ICD-10-CM | POA: Diagnosis not present

## 2022-05-04 DIAGNOSIS — H2511 Age-related nuclear cataract, right eye: Secondary | ICD-10-CM | POA: Diagnosis not present

## 2022-05-04 DIAGNOSIS — H2522 Age-related cataract, morgagnian type, left eye: Secondary | ICD-10-CM | POA: Diagnosis not present

## 2022-05-04 DIAGNOSIS — T85398A Other mechanical complication of other ocular prosthetic devices, implants and grafts, initial encounter: Secondary | ICD-10-CM | POA: Diagnosis not present

## 2022-05-04 DIAGNOSIS — H35371 Puckering of macula, right eye: Secondary | ICD-10-CM | POA: Diagnosis not present

## 2022-05-12 DIAGNOSIS — Z4822 Encounter for aftercare following kidney transplant: Secondary | ICD-10-CM | POA: Diagnosis not present

## 2022-05-12 DIAGNOSIS — Z6829 Body mass index (BMI) 29.0-29.9, adult: Secondary | ICD-10-CM | POA: Diagnosis not present

## 2022-05-12 DIAGNOSIS — N2889 Other specified disorders of kidney and ureter: Secondary | ICD-10-CM | POA: Diagnosis not present

## 2022-05-12 DIAGNOSIS — D6859 Other primary thrombophilia: Secondary | ICD-10-CM | POA: Diagnosis not present

## 2022-05-12 DIAGNOSIS — R635 Abnormal weight gain: Secondary | ICD-10-CM | POA: Diagnosis not present

## 2022-05-12 DIAGNOSIS — D649 Anemia, unspecified: Secondary | ICD-10-CM | POA: Diagnosis not present

## 2022-05-12 DIAGNOSIS — E119 Type 2 diabetes mellitus without complications: Secondary | ICD-10-CM | POA: Diagnosis not present

## 2022-05-12 DIAGNOSIS — I151 Hypertension secondary to other renal disorders: Secondary | ICD-10-CM | POA: Diagnosis not present

## 2022-05-12 DIAGNOSIS — I824Y9 Acute embolism and thrombosis of unspecified deep veins of unspecified proximal lower extremity: Secondary | ICD-10-CM | POA: Diagnosis not present

## 2022-05-12 DIAGNOSIS — D849 Immunodeficiency, unspecified: Secondary | ICD-10-CM | POA: Diagnosis not present

## 2022-05-12 DIAGNOSIS — Z79899 Other long term (current) drug therapy: Secondary | ICD-10-CM | POA: Diagnosis not present

## 2022-05-12 DIAGNOSIS — R109 Unspecified abdominal pain: Secondary | ICD-10-CM | POA: Diagnosis not present

## 2022-05-12 DIAGNOSIS — Z8672 Personal history of thrombophlebitis: Secondary | ICD-10-CM | POA: Diagnosis not present

## 2022-05-12 DIAGNOSIS — Z86718 Personal history of other venous thrombosis and embolism: Secondary | ICD-10-CM | POA: Diagnosis not present

## 2022-05-12 DIAGNOSIS — E1121 Type 2 diabetes mellitus with diabetic nephropathy: Secondary | ICD-10-CM | POA: Diagnosis not present

## 2022-05-12 DIAGNOSIS — Z940 Kidney transplant status: Secondary | ICD-10-CM | POA: Diagnosis not present

## 2022-05-12 DIAGNOSIS — I10 Essential (primary) hypertension: Secondary | ICD-10-CM | POA: Diagnosis not present

## 2022-05-12 DIAGNOSIS — G8929 Other chronic pain: Secondary | ICD-10-CM | POA: Diagnosis not present

## 2022-05-19 DIAGNOSIS — D6859 Other primary thrombophilia: Secondary | ICD-10-CM | POA: Diagnosis not present

## 2022-05-26 DIAGNOSIS — Z4822 Encounter for aftercare following kidney transplant: Secondary | ICD-10-CM | POA: Diagnosis not present

## 2022-05-26 DIAGNOSIS — R6889 Other general symptoms and signs: Secondary | ICD-10-CM | POA: Diagnosis not present

## 2022-05-28 IMAGING — DX DG CHEST 1V PORT
1 series · 1 of 1 positions shown · non-contrast
Comparison: CT scan 11/13/2020

CLINICAL DATA: Severe upper abdominal pain.

EXAM:
PORTABLE CHEST 1 VIEW

[chest ap]
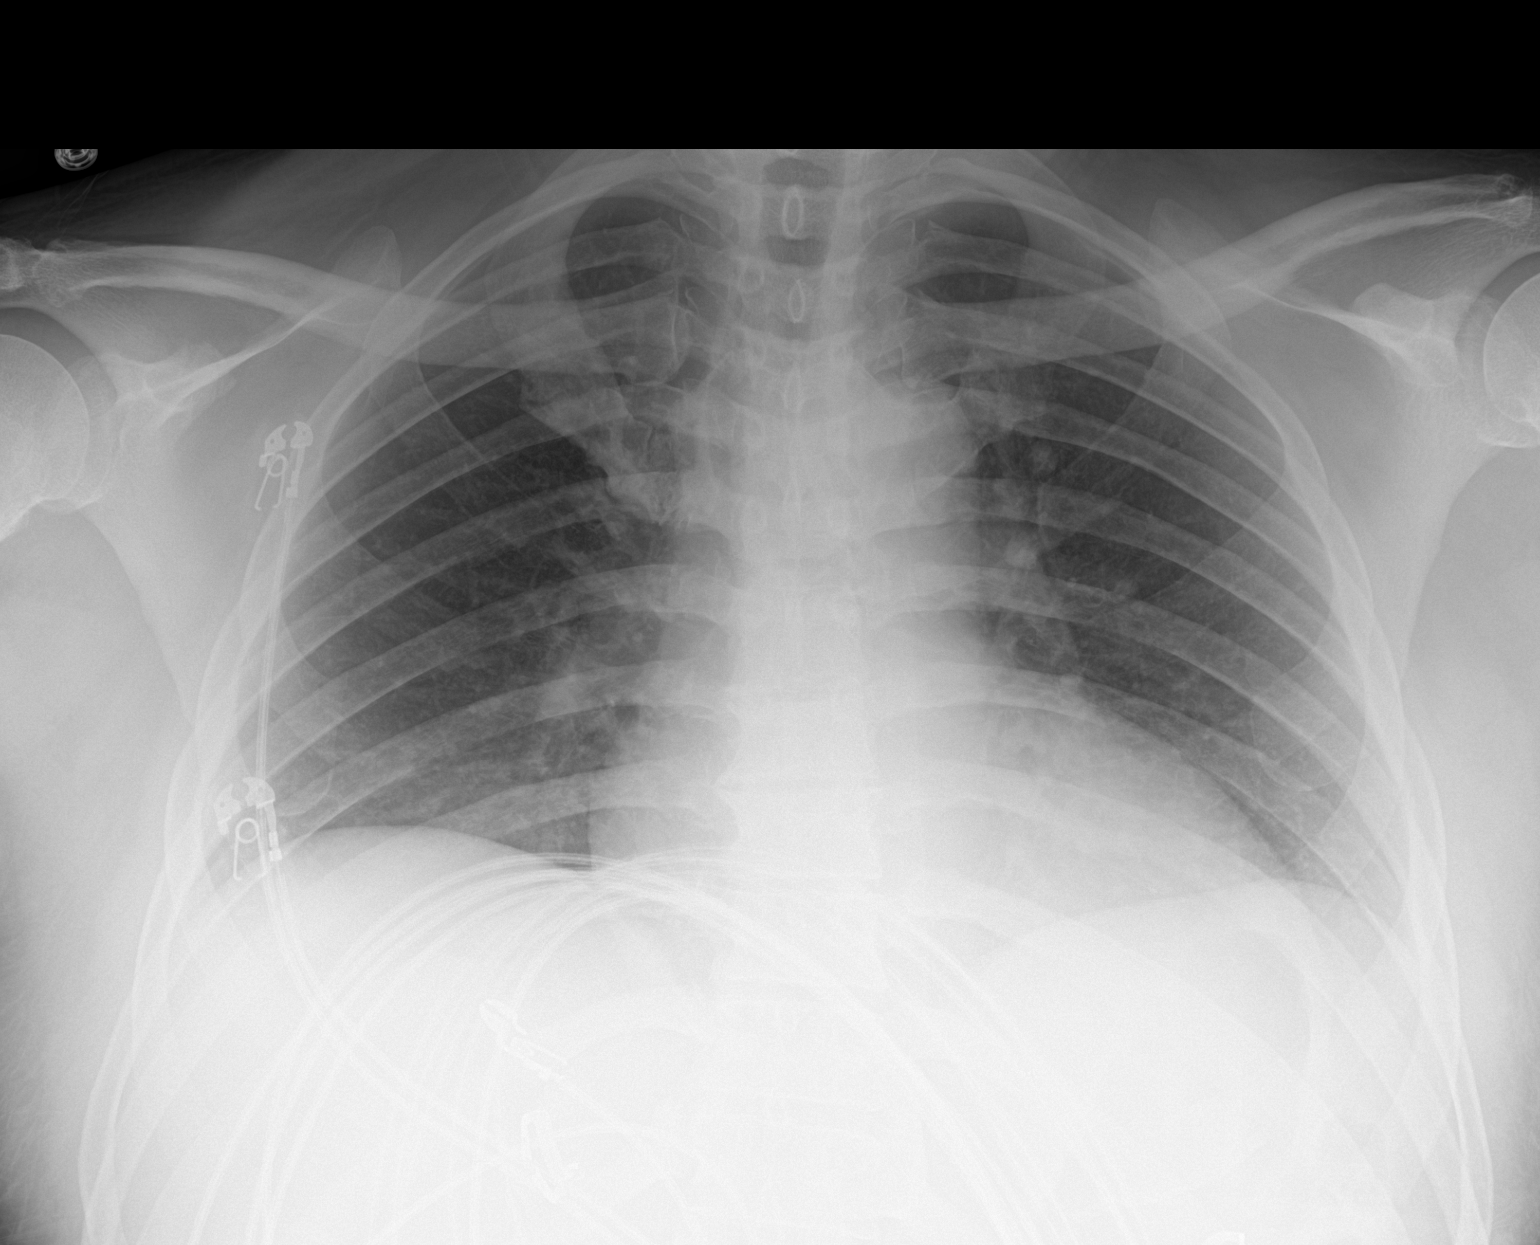

[1 of 1 positions shown; findings below may reference images not displayed]

FINDINGS: The cardiac silhouette, mediastinal and hilar contours are within
normal limits. The lungs are clear. No pleural effusions. No
pneumothorax. No free air is identified under the hemidiaphragms.
IMPRESSION: No acute cardiopulmonary findings.

## 2022-06-09 DIAGNOSIS — Z940 Kidney transplant status: Secondary | ICD-10-CM | POA: Diagnosis not present

## 2022-06-09 DIAGNOSIS — Z4822 Encounter for aftercare following kidney transplant: Secondary | ICD-10-CM | POA: Diagnosis not present

## 2022-06-09 DIAGNOSIS — I82403 Acute embolism and thrombosis of unspecified deep veins of lower extremity, bilateral: Secondary | ICD-10-CM | POA: Diagnosis not present

## 2022-06-09 DIAGNOSIS — R42 Dizziness and giddiness: Secondary | ICD-10-CM | POA: Diagnosis not present

## 2022-06-09 DIAGNOSIS — Z8639 Personal history of other endocrine, nutritional and metabolic disease: Secondary | ICD-10-CM | POA: Diagnosis not present

## 2022-06-09 DIAGNOSIS — Z7901 Long term (current) use of anticoagulants: Secondary | ICD-10-CM | POA: Diagnosis not present

## 2022-06-09 DIAGNOSIS — G8929 Other chronic pain: Secondary | ICD-10-CM | POA: Diagnosis not present

## 2022-06-09 DIAGNOSIS — E1121 Type 2 diabetes mellitus with diabetic nephropathy: Secondary | ICD-10-CM | POA: Diagnosis not present

## 2022-06-09 DIAGNOSIS — I10 Essential (primary) hypertension: Secondary | ICD-10-CM | POA: Diagnosis not present

## 2022-06-09 DIAGNOSIS — Z792 Long term (current) use of antibiotics: Secondary | ICD-10-CM | POA: Diagnosis not present

## 2022-06-09 DIAGNOSIS — R109 Unspecified abdominal pain: Secondary | ICD-10-CM | POA: Diagnosis not present

## 2022-06-09 DIAGNOSIS — H539 Unspecified visual disturbance: Secondary | ICD-10-CM | POA: Diagnosis not present

## 2022-06-09 DIAGNOSIS — Z7952 Long term (current) use of systemic steroids: Secondary | ICD-10-CM | POA: Diagnosis not present

## 2022-06-09 DIAGNOSIS — I151 Hypertension secondary to other renal disorders: Secondary | ICD-10-CM | POA: Diagnosis not present

## 2022-06-09 DIAGNOSIS — Z5181 Encounter for therapeutic drug level monitoring: Secondary | ICD-10-CM | POA: Diagnosis not present

## 2022-06-09 DIAGNOSIS — R9431 Abnormal electrocardiogram [ECG] [EKG]: Secondary | ICD-10-CM | POA: Diagnosis not present

## 2022-06-09 DIAGNOSIS — Z8619 Personal history of other infectious and parasitic diseases: Secondary | ICD-10-CM | POA: Diagnosis not present

## 2022-06-09 DIAGNOSIS — F17200 Nicotine dependence, unspecified, uncomplicated: Secondary | ICD-10-CM | POA: Diagnosis not present

## 2022-06-09 DIAGNOSIS — R112 Nausea with vomiting, unspecified: Secondary | ICD-10-CM | POA: Diagnosis not present

## 2022-06-09 DIAGNOSIS — Z79899 Other long term (current) drug therapy: Secondary | ICD-10-CM | POA: Diagnosis not present

## 2022-06-09 DIAGNOSIS — D649 Anemia, unspecified: Secondary | ICD-10-CM | POA: Diagnosis not present

## 2022-06-09 DIAGNOSIS — Z79624 Long term (current) use of inhibitors of nucleotide synthesis: Secondary | ICD-10-CM | POA: Diagnosis not present

## 2022-06-09 DIAGNOSIS — Z5989 Other problems related to housing and economic circumstances: Secondary | ICD-10-CM | POA: Diagnosis not present

## 2022-06-09 DIAGNOSIS — Z5982 Transportation insecurity: Secondary | ICD-10-CM | POA: Diagnosis not present

## 2022-06-09 DIAGNOSIS — Z79621 Long term (current) use of calcineurin inhibitor: Secondary | ICD-10-CM | POA: Diagnosis not present

## 2022-06-09 DIAGNOSIS — N2889 Other specified disorders of kidney and ureter: Secondary | ICD-10-CM | POA: Diagnosis not present

## 2022-06-22 ENCOUNTER — Encounter (HOSPITAL_COMMUNITY): Payer: Self-pay

## 2022-06-29 ENCOUNTER — Encounter: Payer: Self-pay | Admitting: Podiatry

## 2022-06-29 ENCOUNTER — Ambulatory Visit (INDEPENDENT_AMBULATORY_CARE_PROVIDER_SITE_OTHER): Payer: Medicare Other | Admitting: Podiatry

## 2022-06-29 DIAGNOSIS — N186 End stage renal disease: Secondary | ICD-10-CM | POA: Diagnosis not present

## 2022-06-29 DIAGNOSIS — M79675 Pain in left toe(s): Secondary | ICD-10-CM

## 2022-06-29 DIAGNOSIS — E1142 Type 2 diabetes mellitus with diabetic polyneuropathy: Secondary | ICD-10-CM

## 2022-06-29 DIAGNOSIS — M79674 Pain in right toe(s): Secondary | ICD-10-CM

## 2022-06-29 DIAGNOSIS — Z992 Dependence on renal dialysis: Secondary | ICD-10-CM | POA: Diagnosis not present

## 2022-06-29 DIAGNOSIS — M2010 Hallux valgus (acquired), unspecified foot: Secondary | ICD-10-CM

## 2022-06-29 DIAGNOSIS — B351 Tinea unguium: Secondary | ICD-10-CM | POA: Diagnosis not present

## 2022-06-29 NOTE — Progress Notes (Signed)
Patient presents to the office today for diabetic shoe and insole measuring.  Patient was measured with brannock device to determine size and width for 1 pair of extra depth shoes and foam casted for 3 pair of insoles.   Documentation of medical necessity will be sent to patient's treating diabetic doctor to verify and sign.   Patient's diabetic provider: [name], MD  Shoes and insoles will be ordered at that time and patient will be notified for an appointment for fitting when they arrive.   Shoe size (per patient): 13 men's    Brannock measurement: 12 wide men's  Patient shoe selection-   Shoe choice:   [id number] Apex   Shoe size ordered: 12.5 wide men's   This patient returns to my office for at risk foot care.  This patient requires this care by a professional since this patient will be at risk due to having diabetes with neuropathy.  This patient is unable to cut nails himself since the patient cannot reach his nails.These nails are painful walking and wearing shoes.  This patient presents for at risk foot care today.  General Appearance  Alert, conversant and in no acute stress.  Vascular  Dorsalis pedis and posterior tibial  pulses are palpable  bilaterally.  Capillary return is within normal limits  bilaterally. Temperature is within normal limits  bilaterally.  Neurologic  Senn-Weinstein monofilament wire test diminished  bilaterally. Muscle power within normal limits bilaterally.  Nails Thick disfigured discolored nails with subungual debris  from hallux to fifth toes bilaterally. No evidence of bacterial infection or drainage bilaterally.  Orthopedic  No limitations of motion  feet .  No crepitus or effusions noted.  Hallux valgus  B/L.  Skin  normotropic skin with no porokeratosis noted bilaterally.  No signs of infections or ulcers noted.     Onychomycosis  Pain in right toes  Pain in left toes    Consent was obtained for treatment procedures.   Mechanical  debridement of nails 1-5  bilaterally performed with a nail nipper.  Filed with dremel without incident. Patient qualifies for diabetic shoes due to DPN and HAV  B/L.   Return office visit    3 months                 Told patient to return for periodic foot care and evaluation due to potential at risk complications.   Gardiner Barefoot DPM

## 2022-07-07 DIAGNOSIS — Z940 Kidney transplant status: Secondary | ICD-10-CM | POA: Diagnosis not present

## 2022-07-07 DIAGNOSIS — Z7901 Long term (current) use of anticoagulants: Secondary | ICD-10-CM | POA: Diagnosis not present

## 2022-07-07 DIAGNOSIS — Z4822 Encounter for aftercare following kidney transplant: Secondary | ICD-10-CM | POA: Diagnosis not present

## 2022-07-07 DIAGNOSIS — Z792 Long term (current) use of antibiotics: Secondary | ICD-10-CM | POA: Diagnosis not present

## 2022-07-07 DIAGNOSIS — Z79621 Long term (current) use of calcineurin inhibitor: Secondary | ICD-10-CM | POA: Diagnosis not present

## 2022-07-07 DIAGNOSIS — E119 Type 2 diabetes mellitus without complications: Secondary | ICD-10-CM | POA: Diagnosis not present

## 2022-07-07 DIAGNOSIS — I151 Hypertension secondary to other renal disorders: Secondary | ICD-10-CM | POA: Diagnosis not present

## 2022-07-07 DIAGNOSIS — I82512 Chronic embolism and thrombosis of left femoral vein: Secondary | ICD-10-CM | POA: Diagnosis not present

## 2022-07-07 DIAGNOSIS — E039 Hypothyroidism, unspecified: Secondary | ICD-10-CM | POA: Diagnosis not present

## 2022-07-07 DIAGNOSIS — E1121 Type 2 diabetes mellitus with diabetic nephropathy: Secondary | ICD-10-CM | POA: Diagnosis not present

## 2022-07-07 DIAGNOSIS — D849 Immunodeficiency, unspecified: Secondary | ICD-10-CM | POA: Diagnosis not present

## 2022-07-07 DIAGNOSIS — Z5181 Encounter for therapeutic drug level monitoring: Secondary | ICD-10-CM | POA: Diagnosis not present

## 2022-07-07 DIAGNOSIS — I10 Essential (primary) hypertension: Secondary | ICD-10-CM | POA: Diagnosis not present

## 2022-07-07 DIAGNOSIS — Z79624 Long term (current) use of inhibitors of nucleotide synthesis: Secondary | ICD-10-CM | POA: Diagnosis not present

## 2022-07-07 DIAGNOSIS — Z79899 Other long term (current) drug therapy: Secondary | ICD-10-CM | POA: Diagnosis not present

## 2022-07-07 DIAGNOSIS — I82591 Chronic embolism and thrombosis of other specified deep vein of right lower extremity: Secondary | ICD-10-CM | POA: Diagnosis not present

## 2022-07-07 DIAGNOSIS — R109 Unspecified abdominal pain: Secondary | ICD-10-CM | POA: Diagnosis not present

## 2022-07-07 DIAGNOSIS — Z7952 Long term (current) use of systemic steroids: Secondary | ICD-10-CM | POA: Diagnosis not present

## 2022-07-07 DIAGNOSIS — G8929 Other chronic pain: Secondary | ICD-10-CM | POA: Diagnosis not present

## 2022-07-08 ENCOUNTER — Encounter (HOSPITAL_COMMUNITY): Payer: Self-pay

## 2022-07-12 ENCOUNTER — Ambulatory Visit (HOSPITAL_BASED_OUTPATIENT_CLINIC_OR_DEPARTMENT_OTHER): Payer: Medicare Other | Admitting: Family Medicine

## 2022-07-12 ENCOUNTER — Encounter: Payer: Self-pay | Admitting: Family Medicine

## 2022-07-12 DIAGNOSIS — N529 Male erectile dysfunction, unspecified: Secondary | ICD-10-CM | POA: Diagnosis not present

## 2022-07-12 DIAGNOSIS — E1142 Type 2 diabetes mellitus with diabetic polyneuropathy: Secondary | ICD-10-CM

## 2022-07-12 DIAGNOSIS — E1169 Type 2 diabetes mellitus with other specified complication: Secondary | ICD-10-CM

## 2022-07-12 MED ORDER — SILDENAFIL CITRATE 100 MG PO TABS: 100.0000 mg | ORAL_TABLET | Freq: Every day | ORAL | 1 refills | Status: DC | PRN

## 2022-07-12 MED ORDER — DULOXETINE HCL 60 MG PO CPEP: 60.0000 mg | ORAL_CAPSULE | Freq: Every day | ORAL | 3 refills | Status: DC

## 2022-07-12 NOTE — Progress Notes (Signed)
Virtual Visit via Telephone Note  I connected with Edward [name], on 07/12/2022 at 8:21 AM by telephone and verified that I am speaking with the correct person using two identifiers.   Consent: I discussed the limitations, risks, security and privacy concerns of performing an evaluation and management service by telephone and the availability of in person appointments. I also discussed with the patient that there may be a patient responsible charge related to this service. The patient expressed understanding and agreed to proceed.   Location of Patient: Home  Location of Provider: Clinic   Persons participating in Telemedicine visit: Edward WICKLUND Sr. Dr. Margarita Rana     History of Present Illness: Edward HARDGE Sr. is a 51 y.o. year old male with history significant for hypertension, type 2 diabetes mellitus (A1c 6.9), Graves' disease (status post radioactive iodine treatment in 07/2017), ESRD (status post renal transplant at [facility] on 01/06/2022), erectile dysfunction   He has neuropathy in his feet which is always present.  In the past he tried gabapentin which was ineffective.  Currently does not take any medications for this. He has not been taking his glipizide and is currently not on medications for diabetes.  All his prescriptions are being managed by the renal transplant team. Was seen by podiatrist and is being ordered diabetic shoes  He also complains of erectile dysfunction and was prescribed Cialis by his transplant team which has been ineffective.  He would like a prescription for Viagra instead. Past Medical History:  Diagnosis Date   Acute on chronic renal failure ([place]) 11/25/2015   Anemia    Arthritis    Blind    Left eye   Cataract    CHF (congestive heart failure) (HCC)    Chronic kidney disease    stage 5   COVID-19    Depression    situatuional   Diabetes mellitus    Type II   GERD (gastroesophageal reflux disease)     Graves disease 2014   Headache    in past   Hx of adenomatous and sessile serrated colonic polyps 11/21/2019   Hyperlipidemia    Hypertension    Hypothyroidism    Legally blind    right eye   Neuropathy    Non-compliance    Proteinuria 05/24/2020   Shortness of breath dyspnea    "Better since I've been taking my medications"   No Known Allergies  Current Outpatient Medications on File Prior to Visit  Medication Sig Dispense Refill   Accu-Chek Softclix Lancets lancets Use as instructed 100 each 12   acetaminophen (TYLENOL) 500 MG tablet Take 1 tablet (500 mg total) by mouth every 6 (six) hours as needed for moderate pain or mild pain. 100 tablet 1   Alcohol Swabs (B-D SINGLE USE SWABS REGULAR) PADS Use as directed. 100 each 6   apixaban (ELIQUIS) 5 MG TABS tablet Take 5 mg by mouth 2 (two) times daily.     atorvastatin (LIPITOR) 40 MG tablet Take 1 tablet (40 mg total) by mouth daily. 30 tablet 6   Blood Glucose Calibration (MYGLUCOHEALTH CONTROL) SOLN 1 application. by Misc.(Non-Drug; Combo Route) route once a week. Use to check that meter is working properly once weekly.     Blood Glucose Monitoring Suppl (ACCU-CHEK GUIDE ME) w/Device KIT Use 3 times daily before meals 1 kit 0   carvedilol (COREG) 12.5 MG tablet Take 12.5 mg by mouth 2 (two) times daily with a meal.  cinacalcet (SENSIPAR) 90 MG tablet Take 90 mg by mouth daily.     diclofenac Sodium (VOLTAREN) 1 % GEL Apply 4 g topically 4 (four) times daily. 100 g 1   furosemide (LASIX) 80 MG tablet Take 1 tablet (80 mg total) by mouth 2 (two) times daily. On the days you DO NOT have dialysis 34 tablet 2   glipiZIDE (GLUCOTROL) 5 MG tablet Take 2.5 mg by mouth daily.     glucose blood (ACCU-CHEK GUIDE) test strip Use as instructed 100 each 12   IRON SUCROSE IV Iron Sucrose (Venofer)     levothyroxine (SYNTHROID) 137 MCG tablet Take 1 tablet (137 mcg total) by mouth daily before breakfast. 30 tablet 2   metoCLOPramide (REGLAN) 10  MG tablet Take 1 tablet (10 mg total) by mouth every 6 (six) hours. 20 tablet 0   mycophenolate (MYFORTIC) 180 MG EC tablet Take 180 mg by mouth 2 (two) times daily.     ondansetron (ZOFRAN-ODT) 4 MG disintegrating tablet Take 1 tablet (4 mg total) by mouth every 8 (eight) hours as needed for nausea or vomiting. 20 tablet 0   oxyCODONE (ROXICODONE) 5 MG immediate release tablet Take 1 tablet (5 mg total) by mouth every 4 (four) hours as needed for severe pain. 15 tablet 0   pantoprazole (PROTONIX) 40 MG tablet Take 1 tablet (40 mg total) by mouth daily. 30 tablet 2   polyethylene glycol powder (GLYCOLAX/MIRALAX) 17 GM/SCOOP powder Take by mouth.     potassium chloride SA (KLOR-CON M) 20 MEQ tablet Take 2 tablets (40 mEq total) by mouth daily for 3 days. 6 tablet 0   promethazine (PHENERGAN) 25 MG tablet Take 1 tablet (25 mg total) by mouth every 6 (six) hours as needed for nausea or vomiting. 30 tablet 0   senna-docusate (SENOKOT-S) 8.6-50 MG tablet Take by mouth.     SENSIPAR 30 MG tablet Take 30 mg by mouth daily.     sevelamer carbonate (RENVELA) 800 MG tablet Take 1 tablet (800 mg total) by mouth 3 (three) times daily with meals. 90 tablet 0   sildenafil (VIAGRA) 50 MG tablet Take 2 tablets (100 mg total) by mouth daily as needed for erectile dysfunction. 10 tablet 2   sulfamethoxazole-trimethoprim (BACTRIM) 400-80 MG tablet Take 1 tablet by mouth 3 (three) times a week.     tacrolimus (PROGRAF) 1 MG capsule Take 1 mg by mouth 2 (two) times daily.     valGANciclovir (VALCYTE) 450 MG tablet Take 450 mg by mouth daily.     VELPHORO 500 MG chewable tablet Chew 1,000 mg by mouth 3 (three) times daily.     No current facility-administered medications on file prior to visit.    ROS: See HPI  Observations/Objective: Awake, alert, oriented x3 Not in acute distress Normal mood      Latest Ref Rng & Units 03/20/2022    9:31 PM 03/19/2022    6:33 AM 03/15/2022    7:34 PM  CMP  Glucose 70 -  99 mg/dL 162  143  169   BUN 6 - 20 mg/dL _0 Creatinine 0.61 - 1.24 mg/dL 2.22  1.66  1.56   Sodium 135 - 145 mmol/L 138  136  142   Potassium 3.5 - 5.1 mmol/L 3.1  3.5  3.7   Chloride 98 - 111 mmol/L 103  101  112   CO2 22 - 32 mmol/L _1 Calcium 8.9 -  10.3 mg/dL 9.3  10.0  9.6   Total Protein 6.5 - 8.1 g/dL 7.9  8.6  7.2   Total Bilirubin 0.3 - 1.2 mg/dL 1.5  1.4  0.8   Alkaline Phos 38 - 126 U/L 95  108  114   AST 15 - 41 U/L _0 ALT 0 - 44 U/L _1 Lipid Panel     Component Value Date/Time   CHOL 137 10/21/2019 0854   TRIG 116 10/21/2019 0854   HDL 55 10/21/2019 0854   CHOLHDL 2.5 10/21/2019 0854   CHOLHDL 2.8 09/19/2016 1023   VLDL 37 (H) 09/19/2016 1023   LDLCALC 61 10/21/2019 0854   LABVLDL 21 10/21/2019 0854    Lab Results  Component Value Date   HGBA1C 4.6 12/02/2021   A1c from 07/07/2022 - 6.9 (Care Everywhere)   Assessment and Plan: 1. Type 2 diabetes mellitus with other specified complication, without long-term current use of insulin (HCC) Last A1c was 6.9 He is on diet control Counseled on Diabetic diet, my plate method, 009 minutes of moderate intensity exercise/week Blood sugar logs with fasting goals of 80-120 mg/dl, random of less than 180 and in the event of sugars less than 60 mg/dl or greater than 400 mg/dl encouraged to notify the clinic. Advised on the need for annual eye exams, annual foot exams, Pneumonia vaccine.  2. Erectile dysfunction, unspecified erectile dysfunction type Uncontrolled Refill Viagra If symptoms remain uncontrolled consider referral to urology - sildenafil (VIAGRA) 100 MG tablet; Take 1 tablet (100 mg total) by mouth daily as needed for erectile dysfunction.  Dispense: 10 tablet; Refill: 1  3. Diabetic polyneuropathy associated with type 2 diabetes mellitus ([place]) Uncontrolled Did not do well on gabapentin in the past Placed on Cymbalta - DULoxetine (CYMBALTA) 60 MG capsule; Take 1  capsule (60 mg total) by mouth daily.  Dispense: 30 capsule; Refill: 3   Follow Up Instructions: He will set up an appointment once he has been released by the transplant team   I discussed the assessment and treatment plan with the patient. The patient was provided an opportunity to ask questions and all were answered. The patient agreed with the plan and demonstrated an understanding of the instructions.   The patient was advised to call back or seek an in-person evaluation if the symptoms worsen or if the condition fails to improve as anticipated.     I provided 12 minutes total of non-face-to-face time during this encounter.   [name], MD, FAAFP. [facility] [place], [place]   07/12/2022, 8:21 AM

## 2022-08-04 DIAGNOSIS — Z79899 Other long term (current) drug therapy: Secondary | ICD-10-CM | POA: Diagnosis not present

## 2022-08-04 DIAGNOSIS — Z79624 Long term (current) use of inhibitors of nucleotide synthesis: Secondary | ICD-10-CM | POA: Diagnosis not present

## 2022-08-04 DIAGNOSIS — Z940 Kidney transplant status: Secondary | ICD-10-CM | POA: Diagnosis not present

## 2022-08-04 DIAGNOSIS — I10 Essential (primary) hypertension: Secondary | ICD-10-CM | POA: Diagnosis not present

## 2022-08-04 DIAGNOSIS — D649 Anemia, unspecified: Secondary | ICD-10-CM | POA: Diagnosis not present

## 2022-08-04 DIAGNOSIS — D849 Immunodeficiency, unspecified: Secondary | ICD-10-CM | POA: Diagnosis not present

## 2022-08-04 DIAGNOSIS — E038 Other specified hypothyroidism: Secondary | ICD-10-CM | POA: Diagnosis not present

## 2022-08-04 DIAGNOSIS — I82503 Chronic embolism and thrombosis of unspecified deep veins of lower extremity, bilateral: Secondary | ICD-10-CM | POA: Diagnosis not present

## 2022-08-04 DIAGNOSIS — E1121 Type 2 diabetes mellitus with diabetic nephropathy: Secondary | ICD-10-CM | POA: Diagnosis not present

## 2022-08-04 DIAGNOSIS — G8929 Other chronic pain: Secondary | ICD-10-CM | POA: Diagnosis not present

## 2022-08-04 DIAGNOSIS — Z79621 Long term (current) use of calcineurin inhibitor: Secondary | ICD-10-CM | POA: Diagnosis not present

## 2022-08-04 DIAGNOSIS — Z7952 Long term (current) use of systemic steroids: Secondary | ICD-10-CM | POA: Diagnosis not present

## 2022-08-04 DIAGNOSIS — R109 Unspecified abdominal pain: Secondary | ICD-10-CM | POA: Diagnosis not present

## 2022-08-04 DIAGNOSIS — Z4822 Encounter for aftercare following kidney transplant: Secondary | ICD-10-CM | POA: Diagnosis not present

## 2022-08-04 DIAGNOSIS — Z7901 Long term (current) use of anticoagulants: Secondary | ICD-10-CM | POA: Diagnosis not present

## 2022-08-10 ENCOUNTER — Ambulatory Visit (INDEPENDENT_AMBULATORY_CARE_PROVIDER_SITE_OTHER): Payer: Medicare Other

## 2022-08-10 DIAGNOSIS — M2012 Hallux valgus (acquired), left foot: Secondary | ICD-10-CM | POA: Diagnosis not present

## 2022-08-10 DIAGNOSIS — E1142 Type 2 diabetes mellitus with diabetic polyneuropathy: Secondary | ICD-10-CM

## 2022-08-10 DIAGNOSIS — M2011 Hallux valgus (acquired), right foot: Secondary | ICD-10-CM

## 2022-08-10 NOTE — Progress Notes (Signed)
Patient presents today to pick up diabetic shoes and insoles.  Patient was dispensed 1 pair of diabetic shoes and 3 pairs of foam casted diabetic insoles. Fit was satisfactory. Instructions for break-in and wear was reviewed and a copy was given to the patient.   Re-appointment for regularly scheduled diabetic foot care visits or if they should experience any trouble with the shoes or insoles.  

## 2022-09-01 DIAGNOSIS — Z792 Long term (current) use of antibiotics: Secondary | ICD-10-CM | POA: Diagnosis not present

## 2022-09-01 DIAGNOSIS — E785 Hyperlipidemia, unspecified: Secondary | ICD-10-CM | POA: Diagnosis not present

## 2022-09-01 DIAGNOSIS — Z4822 Encounter for aftercare following kidney transplant: Secondary | ICD-10-CM | POA: Diagnosis not present

## 2022-09-01 DIAGNOSIS — D649 Anemia, unspecified: Secondary | ICD-10-CM | POA: Diagnosis not present

## 2022-09-01 DIAGNOSIS — I824Y3 Acute embolism and thrombosis of unspecified deep veins of proximal lower extremity, bilateral: Secondary | ICD-10-CM | POA: Diagnosis not present

## 2022-09-01 DIAGNOSIS — Z5901 Sheltered homelessness: Secondary | ICD-10-CM | POA: Diagnosis not present

## 2022-09-01 DIAGNOSIS — Z79624 Long term (current) use of inhibitors of nucleotide synthesis: Secondary | ICD-10-CM | POA: Diagnosis not present

## 2022-09-01 DIAGNOSIS — Z79899 Other long term (current) drug therapy: Secondary | ICD-10-CM | POA: Diagnosis not present

## 2022-09-01 DIAGNOSIS — E119 Type 2 diabetes mellitus without complications: Secondary | ICD-10-CM | POA: Diagnosis not present

## 2022-09-01 DIAGNOSIS — R42 Dizziness and giddiness: Secondary | ICD-10-CM | POA: Diagnosis not present

## 2022-09-01 DIAGNOSIS — E039 Hypothyroidism, unspecified: Secondary | ICD-10-CM | POA: Diagnosis not present

## 2022-09-01 DIAGNOSIS — Z79621 Long term (current) use of calcineurin inhibitor: Secondary | ICD-10-CM | POA: Diagnosis not present

## 2022-09-01 DIAGNOSIS — I10 Essential (primary) hypertension: Secondary | ICD-10-CM | POA: Diagnosis not present

## 2022-09-01 DIAGNOSIS — Z7952 Long term (current) use of systemic steroids: Secondary | ICD-10-CM | POA: Diagnosis not present

## 2022-09-20 DIAGNOSIS — R079 Chest pain, unspecified: Secondary | ICD-10-CM | POA: Diagnosis not present

## 2022-09-20 DIAGNOSIS — Z20822 Contact with and (suspected) exposure to covid-19: Secondary | ICD-10-CM | POA: Diagnosis not present

## 2022-09-20 DIAGNOSIS — I3139 Other pericardial effusion (noninflammatory): Secondary | ICD-10-CM | POA: Diagnosis not present

## 2022-09-20 DIAGNOSIS — R0789 Other chest pain: Secondary | ICD-10-CM | POA: Diagnosis not present

## 2022-09-20 DIAGNOSIS — R1084 Generalized abdominal pain: Secondary | ICD-10-CM | POA: Diagnosis not present

## 2022-09-20 DIAGNOSIS — Z743 Need for continuous supervision: Secondary | ICD-10-CM | POA: Diagnosis not present

## 2022-09-20 DIAGNOSIS — Z4822 Encounter for aftercare following kidney transplant: Secondary | ICD-10-CM | POA: Diagnosis not present

## 2022-09-20 DIAGNOSIS — D84821 Immunodeficiency due to drugs: Secondary | ICD-10-CM | POA: Diagnosis not present

## 2022-09-20 DIAGNOSIS — I10 Essential (primary) hypertension: Secondary | ICD-10-CM | POA: Diagnosis not present

## 2022-09-20 DIAGNOSIS — J9811 Atelectasis: Secondary | ICD-10-CM | POA: Diagnosis not present

## 2022-09-20 DIAGNOSIS — G8929 Other chronic pain: Secondary | ICD-10-CM | POA: Diagnosis not present

## 2022-09-20 DIAGNOSIS — K529 Noninfective gastroenteritis and colitis, unspecified: Secondary | ICD-10-CM | POA: Diagnosis not present

## 2022-09-20 DIAGNOSIS — R109 Unspecified abdominal pain: Secondary | ICD-10-CM | POA: Diagnosis not present

## 2022-09-20 DIAGNOSIS — Z940 Kidney transplant status: Secondary | ICD-10-CM | POA: Diagnosis not present

## 2022-09-20 DIAGNOSIS — Z86718 Personal history of other venous thrombosis and embolism: Secondary | ICD-10-CM | POA: Diagnosis not present

## 2022-09-20 DIAGNOSIS — R935 Abnormal findings on diagnostic imaging of other abdominal regions, including retroperitoneum: Secondary | ICD-10-CM | POA: Diagnosis not present

## 2022-09-20 DIAGNOSIS — Z79899 Other long term (current) drug therapy: Secondary | ICD-10-CM | POA: Diagnosis not present

## 2022-09-20 DIAGNOSIS — E11319 Type 2 diabetes mellitus with unspecified diabetic retinopathy without macular edema: Secondary | ICD-10-CM | POA: Diagnosis not present

## 2022-09-20 DIAGNOSIS — I517 Cardiomegaly: Secondary | ICD-10-CM | POA: Diagnosis not present

## 2022-09-20 DIAGNOSIS — R110 Nausea: Secondary | ICD-10-CM | POA: Diagnosis not present

## 2022-09-20 DIAGNOSIS — Z87891 Personal history of nicotine dependence: Secondary | ICD-10-CM | POA: Diagnosis not present

## 2022-09-21 DIAGNOSIS — Z20822 Contact with and (suspected) exposure to covid-19: Secondary | ICD-10-CM | POA: Diagnosis not present

## 2022-09-21 DIAGNOSIS — I517 Cardiomegaly: Secondary | ICD-10-CM | POA: Diagnosis not present

## 2022-09-21 DIAGNOSIS — I10 Essential (primary) hypertension: Secondary | ICD-10-CM | POA: Diagnosis not present

## 2022-09-21 DIAGNOSIS — Z79620 Long term (current) use of immunosuppressive biologic: Secondary | ICD-10-CM | POA: Diagnosis not present

## 2022-09-21 DIAGNOSIS — Z87891 Personal history of nicotine dependence: Secondary | ICD-10-CM | POA: Diagnosis not present

## 2022-09-21 DIAGNOSIS — Z86718 Personal history of other venous thrombosis and embolism: Secondary | ICD-10-CM | POA: Diagnosis not present

## 2022-09-21 DIAGNOSIS — D84821 Immunodeficiency due to drugs: Secondary | ICD-10-CM | POA: Diagnosis not present

## 2022-09-21 DIAGNOSIS — I3139 Other pericardial effusion (noninflammatory): Secondary | ICD-10-CM | POA: Diagnosis not present

## 2022-09-21 DIAGNOSIS — Z940 Kidney transplant status: Secondary | ICD-10-CM | POA: Diagnosis not present

## 2022-09-21 DIAGNOSIS — K529 Noninfective gastroenteritis and colitis, unspecified: Secondary | ICD-10-CM | POA: Diagnosis not present

## 2022-09-21 DIAGNOSIS — J9811 Atelectasis: Secondary | ICD-10-CM | POA: Diagnosis not present

## 2022-09-21 DIAGNOSIS — E11319 Type 2 diabetes mellitus with unspecified diabetic retinopathy without macular edema: Secondary | ICD-10-CM | POA: Diagnosis not present

## 2022-09-21 DIAGNOSIS — Z79899 Other long term (current) drug therapy: Secondary | ICD-10-CM | POA: Diagnosis not present

## 2022-09-21 DIAGNOSIS — R1084 Generalized abdominal pain: Secondary | ICD-10-CM | POA: Diagnosis not present

## 2022-09-21 DIAGNOSIS — G8929 Other chronic pain: Secondary | ICD-10-CM | POA: Diagnosis not present

## 2022-09-28 ENCOUNTER — Ambulatory Visit: Payer: Medicare Other | Admitting: Podiatry

## 2022-09-28 DIAGNOSIS — I498 Other specified cardiac arrhythmias: Secondary | ICD-10-CM | POA: Diagnosis not present

## 2022-09-29 DIAGNOSIS — Z5181 Encounter for therapeutic drug level monitoring: Secondary | ICD-10-CM | POA: Diagnosis not present

## 2022-09-29 DIAGNOSIS — Z79899 Other long term (current) drug therapy: Secondary | ICD-10-CM | POA: Diagnosis not present

## 2022-09-29 DIAGNOSIS — I10 Essential (primary) hypertension: Secondary | ICD-10-CM | POA: Diagnosis not present

## 2022-09-29 DIAGNOSIS — R3911 Hesitancy of micturition: Secondary | ICD-10-CM | POA: Diagnosis not present

## 2022-09-29 DIAGNOSIS — Z4822 Encounter for aftercare following kidney transplant: Secondary | ICD-10-CM | POA: Diagnosis not present

## 2022-09-29 DIAGNOSIS — E119 Type 2 diabetes mellitus without complications: Secondary | ICD-10-CM | POA: Diagnosis not present

## 2022-09-29 DIAGNOSIS — R39198 Other difficulties with micturition: Secondary | ICD-10-CM | POA: Diagnosis not present

## 2022-09-29 DIAGNOSIS — K5909 Other constipation: Secondary | ICD-10-CM | POA: Diagnosis not present

## 2022-09-29 DIAGNOSIS — Z5982 Transportation insecurity: Secondary | ICD-10-CM | POA: Diagnosis not present

## 2022-09-29 DIAGNOSIS — Z7952 Long term (current) use of systemic steroids: Secondary | ICD-10-CM | POA: Diagnosis not present

## 2022-09-29 DIAGNOSIS — Z940 Kidney transplant status: Secondary | ICD-10-CM | POA: Diagnosis not present

## 2022-09-29 DIAGNOSIS — E1121 Type 2 diabetes mellitus with diabetic nephropathy: Secondary | ICD-10-CM | POA: Diagnosis not present

## 2022-09-29 DIAGNOSIS — I151 Hypertension secondary to other renal disorders: Secondary | ICD-10-CM | POA: Diagnosis not present

## 2022-09-29 DIAGNOSIS — Z79621 Long term (current) use of calcineurin inhibitor: Secondary | ICD-10-CM | POA: Diagnosis not present

## 2022-09-29 DIAGNOSIS — Z792 Long term (current) use of antibiotics: Secondary | ICD-10-CM | POA: Diagnosis not present

## 2022-09-29 DIAGNOSIS — Z5901 Sheltered homelessness: Secondary | ICD-10-CM | POA: Diagnosis not present

## 2022-09-29 DIAGNOSIS — R112 Nausea with vomiting, unspecified: Secondary | ICD-10-CM | POA: Diagnosis not present

## 2022-09-29 DIAGNOSIS — D849 Immunodeficiency, unspecified: Secondary | ICD-10-CM | POA: Diagnosis not present

## 2022-09-29 DIAGNOSIS — Z596 Low income: Secondary | ICD-10-CM | POA: Diagnosis not present

## 2022-10-03 DIAGNOSIS — R109 Unspecified abdominal pain: Secondary | ICD-10-CM | POA: Diagnosis not present

## 2022-10-03 DIAGNOSIS — Z743 Need for continuous supervision: Secondary | ICD-10-CM | POA: Diagnosis not present

## 2022-10-03 DIAGNOSIS — R000 Tachycardia, unspecified: Secondary | ICD-10-CM | POA: Diagnosis not present

## 2022-10-03 DIAGNOSIS — I252 Old myocardial infarction: Secondary | ICD-10-CM | POA: Diagnosis not present

## 2022-10-03 DIAGNOSIS — R112 Nausea with vomiting, unspecified: Secondary | ICD-10-CM | POA: Diagnosis not present

## 2022-10-03 DIAGNOSIS — R0902 Hypoxemia: Secondary | ICD-10-CM | POA: Diagnosis not present

## 2022-10-03 DIAGNOSIS — R1084 Generalized abdominal pain: Secondary | ICD-10-CM | POA: Diagnosis not present

## 2022-10-03 DIAGNOSIS — I3139 Other pericardial effusion (noninflammatory): Secondary | ICD-10-CM | POA: Diagnosis not present

## 2022-10-03 DIAGNOSIS — Z87891 Personal history of nicotine dependence: Secondary | ICD-10-CM | POA: Diagnosis not present

## 2022-10-03 DIAGNOSIS — R6889 Other general symptoms and signs: Secondary | ICD-10-CM | POA: Diagnosis not present

## 2022-10-11 ENCOUNTER — Encounter (HOSPITAL_COMMUNITY): Payer: Self-pay

## 2022-10-11 DIAGNOSIS — H2522 Age-related cataract, morgagnian type, left eye: Secondary | ICD-10-CM | POA: Diagnosis not present

## 2022-10-11 DIAGNOSIS — H4312 Vitreous hemorrhage, left eye: Secondary | ICD-10-CM | POA: Diagnosis not present

## 2022-10-11 DIAGNOSIS — H2511 Age-related nuclear cataract, right eye: Secondary | ICD-10-CM | POA: Diagnosis not present

## 2022-10-11 DIAGNOSIS — E113513 Type 2 diabetes mellitus with proliferative diabetic retinopathy with macular edema, bilateral: Secondary | ICD-10-CM | POA: Diagnosis not present

## 2022-10-11 DIAGNOSIS — H35371 Puckering of macula, right eye: Secondary | ICD-10-CM | POA: Diagnosis not present

## 2022-10-11 DIAGNOSIS — E113511 Type 2 diabetes mellitus with proliferative diabetic retinopathy with macular edema, right eye: Secondary | ICD-10-CM | POA: Diagnosis not present

## 2022-10-11 DIAGNOSIS — T85398A Other mechanical complication of other ocular prosthetic devices, implants and grafts, initial encounter: Secondary | ICD-10-CM | POA: Diagnosis not present

## 2022-10-18 ENCOUNTER — Encounter: Payer: Self-pay | Admitting: Podiatry

## 2022-10-18 ENCOUNTER — Ambulatory Visit (INDEPENDENT_AMBULATORY_CARE_PROVIDER_SITE_OTHER): Payer: 59 | Admitting: Podiatry

## 2022-10-18 DIAGNOSIS — E1142 Type 2 diabetes mellitus with diabetic polyneuropathy: Secondary | ICD-10-CM | POA: Diagnosis not present

## 2022-10-18 DIAGNOSIS — M79675 Pain in left toe(s): Secondary | ICD-10-CM

## 2022-10-18 DIAGNOSIS — Z992 Dependence on renal dialysis: Secondary | ICD-10-CM

## 2022-10-18 DIAGNOSIS — M79674 Pain in right toe(s): Secondary | ICD-10-CM | POA: Diagnosis not present

## 2022-10-18 DIAGNOSIS — B351 Tinea unguium: Secondary | ICD-10-CM | POA: Diagnosis not present

## 2022-10-18 DIAGNOSIS — N186 End stage renal disease: Secondary | ICD-10-CM | POA: Diagnosis not present

## 2022-10-18 NOTE — Progress Notes (Signed)
This patient returns to my office for at risk foot care.  This patient requires this care by a professional since this patient will be at risk due to having ESRD, Blind,   and DM with polyneuropathy.  This patient is unable to cut nails himself since the patient cannot reach his nails.These nails are painful walking and wearing shoes.  Patient has kidney transplant. This patient presents for at risk foot care today.  General Appearance  Alert, conversant and in no acute stress.  Vascular  Dorsalis pedis and posterior tibial  pulses are  weakly palpable  bilaterally.  Capillary return is within normal limits  bilaterally. Cold feet. bilaterally.  Neurologic  Senn-Weinstein monofilament wire test diminished  bilaterally. Muscle power within normal limits bilaterally.  Nails Thick disfigured discolored nails with subungual debris  from hallux to fifth toes bilaterally. No evidence of bacterial infection or drainage bilaterally. HAV  B/L.  Orthopedic  No limitations of motion  feet .  No crepitus or effusions noted.  No bony pathology or digital deformities noted.  HAV  B/L.    Skin  normotropic skin with no porokeratosis noted bilaterally.  No signs of infections or ulcers noted.     Onychomycosis  Pain in right toes  Pain in left toes  Consent was obtained for treatment procedures.   Mechanical debridement of nails 1-5  bilaterally performed with a nail nipper.  Filed with dremel without incident.    Return office visit  4 months                    Told patient to return for periodic foot care and evaluation due to potential at risk complications.   Gardiner Barefoot DPM

## 2022-10-20 DIAGNOSIS — I82513 Chronic embolism and thrombosis of femoral vein, bilateral: Secondary | ICD-10-CM | POA: Diagnosis not present

## 2022-10-20 DIAGNOSIS — G8929 Other chronic pain: Secondary | ICD-10-CM | POA: Diagnosis not present

## 2022-10-20 DIAGNOSIS — D84821 Immunodeficiency due to drugs: Secondary | ICD-10-CM | POA: Diagnosis not present

## 2022-10-20 DIAGNOSIS — E1121 Type 2 diabetes mellitus with diabetic nephropathy: Secondary | ICD-10-CM | POA: Diagnosis not present

## 2022-10-20 DIAGNOSIS — Z79624 Long term (current) use of inhibitors of nucleotide synthesis: Secondary | ICD-10-CM | POA: Diagnosis not present

## 2022-10-20 DIAGNOSIS — Z79899 Other long term (current) drug therapy: Secondary | ICD-10-CM | POA: Diagnosis not present

## 2022-10-20 DIAGNOSIS — D631 Anemia in chronic kidney disease: Secondary | ICD-10-CM | POA: Diagnosis not present

## 2022-10-20 DIAGNOSIS — Z79621 Long term (current) use of calcineurin inhibitor: Secondary | ICD-10-CM | POA: Diagnosis not present

## 2022-10-20 DIAGNOSIS — Z940 Kidney transplant status: Secondary | ICD-10-CM | POA: Diagnosis not present

## 2022-10-20 DIAGNOSIS — D849 Immunodeficiency, unspecified: Secondary | ICD-10-CM | POA: Diagnosis not present

## 2022-10-20 DIAGNOSIS — Z7952 Long term (current) use of systemic steroids: Secondary | ICD-10-CM | POA: Diagnosis not present

## 2022-10-20 DIAGNOSIS — Z4822 Encounter for aftercare following kidney transplant: Secondary | ICD-10-CM | POA: Diagnosis not present

## 2022-10-20 DIAGNOSIS — I120 Hypertensive chronic kidney disease with stage 5 chronic kidney disease or end stage renal disease: Secondary | ICD-10-CM | POA: Diagnosis not present

## 2022-10-20 DIAGNOSIS — R112 Nausea with vomiting, unspecified: Secondary | ICD-10-CM | POA: Diagnosis not present

## 2022-10-20 DIAGNOSIS — R109 Unspecified abdominal pain: Secondary | ICD-10-CM | POA: Diagnosis not present

## 2022-10-20 DIAGNOSIS — E1122 Type 2 diabetes mellitus with diabetic chronic kidney disease: Secondary | ICD-10-CM | POA: Diagnosis not present

## 2022-10-20 DIAGNOSIS — Z7901 Long term (current) use of anticoagulants: Secondary | ICD-10-CM | POA: Diagnosis not present

## 2022-10-20 DIAGNOSIS — K5900 Constipation, unspecified: Secondary | ICD-10-CM | POA: Diagnosis not present

## 2022-10-20 DIAGNOSIS — E039 Hypothyroidism, unspecified: Secondary | ICD-10-CM | POA: Diagnosis not present

## 2022-10-20 DIAGNOSIS — N186 End stage renal disease: Secondary | ICD-10-CM | POA: Diagnosis not present

## 2022-10-20 DIAGNOSIS — Z5181 Encounter for therapeutic drug level monitoring: Secondary | ICD-10-CM | POA: Diagnosis not present

## 2022-10-20 DIAGNOSIS — K5909 Other constipation: Secondary | ICD-10-CM | POA: Diagnosis not present

## 2022-10-20 DIAGNOSIS — J984 Other disorders of lung: Secondary | ICD-10-CM | POA: Diagnosis not present

## 2022-10-20 DIAGNOSIS — I151 Hypertension secondary to other renal disorders: Secondary | ICD-10-CM | POA: Diagnosis not present

## 2022-10-26 ENCOUNTER — Ambulatory Visit: Payer: Self-pay | Admitting: *Deleted

## 2022-10-26 NOTE — Telephone Encounter (Signed)
FYI

## 2022-10-26 NOTE — Telephone Encounter (Signed)
  Chief Complaint: Abdominal Pain Symptoms: Greater than 10/10 lower abdominal pain. Diarrhea yesterday, not today. Pt tearful. States "I go to the ED and they send me home and tell me to see my doctor." States has 2 Oxy left from surgery in April. Questioned if they help states "I haven't taken one in so long I don't know." Reiterated need for ED eval. Frequency: "Had pain for years, just worse." Pertinent Negatives: Patient denies  Disposition: '[x]'$ ED /'[]'$ Urgent Care (no appt availability in office) / '[]'$ Appointment(In office/virtual)/ '[]'$  [facility]/ '[]'$ Home Care/ '[]'$ Refused Recommended Disposition /'[]'$ [facility]/ '[]'$  Follow-up with PCP Additional Notes: Pt declines ED. States may go in an hour if still in pain. Reiterated need for ED.  Please advise. Reason for Disposition  [1] SEVERE pain (e.g., excruciating) AND [2] present > 1 hour  Answer Assessment - Initial Assessment Questions 1. LOCATION: "Where does it hurt?"      Below navel 2. RADIATION: "Does the pain shoot anywhere else?" (e.g., chest, back)      3. ONSET: "When did the pain begin?" (Minutes, hours or days ago)      "A while for years. Worsening" 4. SUDDEN: "Gradual or sudden onset?"      5. PATTERN "Does the pain come and go, or is it constant?"    - If it comes and goes: "How long does it last?" "Do you have pain now?"     (Note: Comes and goes means the pain is intermittent. It goes away completely between bouts.)    - If constant: "Is it getting better, staying the same, or getting worse?"      (Note: Constant means the pain never goes away completely; most serious pain is constant and gets worse.)      *No Answer* 6. SEVERITY: "How bad is the pain?"  (e.g., Scale 1-10; mild, moderate, or severe)    - MILD (1-3): Doesn't interfere with normal activities, abdomen soft and not tender to touch.     - MODERATE (4-7): Interferes with normal activities or awakens from sleep, abdomen tender to touch.     -  SEVERE (8-10): Excruciating pain, doubled over, unable to do any normal activities.       10/10 greater 7. RECURRENT SYMPTOM: "Have you ever had this type of stomach pain before?" If Yes, ask: "When was the last time?" and "What happened that time?"       8. CAUSE: "What do you think is causing the stomach pain?"      9. RELIEVING/AGGRAVATING FACTORS: "What makes it better or worse?" (e.g., antacids, bending or twisting motion, bowel movement)      10. OTHER SYMPTOMS: "Do you have any other symptoms?" (e.g., back pain, diarrhea, fever, urination pain, vomiting) 10/10, greater. Diarrhea yesterday.  Protocols used: Abdominal Pain - Male-A-AH

## 2022-10-27 NOTE — Telephone Encounter (Signed)
Pt was informed of PCP response. Pt states that he has already reached out.

## 2022-10-27 NOTE — Telephone Encounter (Signed)
This is a kidney transplant patient. Please advise him to contact his transplant Clinician. Thanks.

## 2022-11-17 DIAGNOSIS — R112 Nausea with vomiting, unspecified: Secondary | ICD-10-CM | POA: Diagnosis not present

## 2022-11-17 DIAGNOSIS — I151 Hypertension secondary to other renal disorders: Secondary | ICD-10-CM | POA: Diagnosis not present

## 2022-11-17 DIAGNOSIS — R609 Edema, unspecified: Secondary | ICD-10-CM | POA: Diagnosis not present

## 2022-11-17 DIAGNOSIS — Z792 Long term (current) use of antibiotics: Secondary | ICD-10-CM | POA: Diagnosis not present

## 2022-11-17 DIAGNOSIS — I824Z3 Acute embolism and thrombosis of unspecified deep veins of distal lower extremity, bilateral: Secondary | ICD-10-CM | POA: Diagnosis not present

## 2022-11-17 DIAGNOSIS — E1121 Type 2 diabetes mellitus with diabetic nephropathy: Secondary | ICD-10-CM | POA: Diagnosis not present

## 2022-11-17 DIAGNOSIS — R12 Heartburn: Secondary | ICD-10-CM | POA: Diagnosis not present

## 2022-11-17 DIAGNOSIS — Z940 Kidney transplant status: Secondary | ICD-10-CM | POA: Diagnosis not present

## 2022-11-17 DIAGNOSIS — Z794 Long term (current) use of insulin: Secondary | ICD-10-CM | POA: Diagnosis not present

## 2022-11-17 DIAGNOSIS — D849 Immunodeficiency, unspecified: Secondary | ICD-10-CM | POA: Diagnosis not present

## 2022-11-17 DIAGNOSIS — R948 Abnormal results of function studies of other organs and systems: Secondary | ICD-10-CM | POA: Diagnosis not present

## 2022-11-17 DIAGNOSIS — Z79621 Long term (current) use of calcineurin inhibitor: Secondary | ICD-10-CM | POA: Diagnosis not present

## 2022-11-17 DIAGNOSIS — Z4822 Encounter for aftercare following kidney transplant: Secondary | ICD-10-CM | POA: Diagnosis not present

## 2022-11-17 DIAGNOSIS — Z7952 Long term (current) use of systemic steroids: Secondary | ICD-10-CM | POA: Diagnosis not present

## 2022-11-17 DIAGNOSIS — R109 Unspecified abdominal pain: Secondary | ICD-10-CM | POA: Diagnosis not present

## 2022-11-17 DIAGNOSIS — Z79899 Other long term (current) drug therapy: Secondary | ICD-10-CM | POA: Diagnosis not present

## 2022-11-17 DIAGNOSIS — G8929 Other chronic pain: Secondary | ICD-10-CM | POA: Diagnosis not present

## 2022-11-17 DIAGNOSIS — D649 Anemia, unspecified: Secondary | ICD-10-CM | POA: Diagnosis not present

## 2022-11-17 DIAGNOSIS — E119 Type 2 diabetes mellitus without complications: Secondary | ICD-10-CM | POA: Diagnosis not present

## 2022-11-24 DIAGNOSIS — G8929 Other chronic pain: Secondary | ICD-10-CM | POA: Diagnosis not present

## 2022-11-24 DIAGNOSIS — Z940 Kidney transplant status: Secondary | ICD-10-CM | POA: Diagnosis not present

## 2022-11-24 DIAGNOSIS — R109 Unspecified abdominal pain: Secondary | ICD-10-CM | POA: Diagnosis not present

## 2022-11-24 DIAGNOSIS — I120 Hypertensive chronic kidney disease with stage 5 chronic kidney disease or end stage renal disease: Secondary | ICD-10-CM | POA: Diagnosis not present

## 2022-11-24 DIAGNOSIS — E1122 Type 2 diabetes mellitus with diabetic chronic kidney disease: Secondary | ICD-10-CM | POA: Diagnosis not present

## 2022-11-24 DIAGNOSIS — Z992 Dependence on renal dialysis: Secondary | ICD-10-CM | POA: Diagnosis not present

## 2022-11-24 DIAGNOSIS — Z87891 Personal history of nicotine dependence: Secondary | ICD-10-CM | POA: Diagnosis not present

## 2022-11-24 DIAGNOSIS — N186 End stage renal disease: Secondary | ICD-10-CM | POA: Diagnosis not present

## 2022-12-13 ENCOUNTER — Encounter (HOSPITAL_COMMUNITY): Payer: Self-pay

## 2022-12-13 ENCOUNTER — Telehealth: Payer: Self-pay | Admitting: Family Medicine

## 2022-12-13 NOTE — Telephone Encounter (Signed)
Called patient to schedule Medicare Annual Wellness Visit (AWV). Left message for patient to call back and schedule Medicare Annual Wellness Visit (AWV).  Last date of AWV: 07/27/21   If any questions, please contact me at [phone]  Thank you ,  Barkley Boards AWV direct phone # [phone]

## 2022-12-13 NOTE — Telephone Encounter (Signed)
Called patient to schedule Medicare Annual Wellness Visit (AWV). Left message for patient to call back and schedule Medicare Annual Wellness Visit (AWV).  Last date of AWV: 07/27/21   If any questions, please contact me at [phone].  Thank you ,  Barkley Boards AWV direct phone # [phone]  Patient returned my call

## 2022-12-14 ENCOUNTER — Encounter: Payer: Self-pay | Admitting: Internal Medicine

## 2022-12-15 DIAGNOSIS — Z9189 Other specified personal risk factors, not elsewhere classified: Secondary | ICD-10-CM | POA: Diagnosis not present

## 2022-12-15 DIAGNOSIS — D849 Immunodeficiency, unspecified: Secondary | ICD-10-CM | POA: Diagnosis not present

## 2022-12-15 DIAGNOSIS — Z79899 Other long term (current) drug therapy: Secondary | ICD-10-CM | POA: Diagnosis not present

## 2022-12-15 DIAGNOSIS — I10 Essential (primary) hypertension: Secondary | ICD-10-CM | POA: Diagnosis not present

## 2022-12-15 DIAGNOSIS — Z940 Kidney transplant status: Secondary | ICD-10-CM | POA: Diagnosis not present

## 2022-12-15 DIAGNOSIS — G8929 Other chronic pain: Secondary | ICD-10-CM | POA: Diagnosis not present

## 2022-12-19 ENCOUNTER — Emergency Department (HOSPITAL_COMMUNITY)
Admission: EM | Admit: 2022-12-19 | Discharge: 2022-12-19 | Disposition: A | Discharge status: Home / Self Care | Payer: 59 | Attending: Emergency Medicine | Admitting: Emergency Medicine

## 2022-12-19 ENCOUNTER — Other Ambulatory Visit: Payer: Self-pay

## 2022-12-19 ENCOUNTER — Emergency Department (HOSPITAL_COMMUNITY): Payer: 59

## 2022-12-19 DIAGNOSIS — Z79899 Other long term (current) drug therapy: Secondary | ICD-10-CM | POA: Diagnosis not present

## 2022-12-19 DIAGNOSIS — K529 Noninfective gastroenteritis and colitis, unspecified: Secondary | ICD-10-CM

## 2022-12-19 DIAGNOSIS — E1165 Type 2 diabetes mellitus with hyperglycemia: Secondary | ICD-10-CM | POA: Diagnosis not present

## 2022-12-19 DIAGNOSIS — I10 Essential (primary) hypertension: Secondary | ICD-10-CM | POA: Diagnosis not present

## 2022-12-19 DIAGNOSIS — I129 Hypertensive chronic kidney disease with stage 1 through stage 4 chronic kidney disease, or unspecified chronic kidney disease: Secondary | ICD-10-CM | POA: Insufficient documentation

## 2022-12-19 DIAGNOSIS — E1122 Type 2 diabetes mellitus with diabetic chronic kidney disease: Secondary | ICD-10-CM | POA: Insufficient documentation

## 2022-12-19 DIAGNOSIS — R509 Fever, unspecified: Secondary | ICD-10-CM | POA: Diagnosis not present

## 2022-12-19 DIAGNOSIS — Z7901 Long term (current) use of anticoagulants: Secondary | ICD-10-CM | POA: Insufficient documentation

## 2022-12-19 DIAGNOSIS — Z1152 Encounter for screening for COVID-19: Secondary | ICD-10-CM | POA: Insufficient documentation

## 2022-12-19 DIAGNOSIS — R109 Unspecified abdominal pain: Secondary | ICD-10-CM | POA: Diagnosis not present

## 2022-12-19 DIAGNOSIS — N189 Chronic kidney disease, unspecified: Secondary | ICD-10-CM | POA: Insufficient documentation

## 2022-12-19 DIAGNOSIS — R0902 Hypoxemia: Secondary | ICD-10-CM | POA: Diagnosis not present

## 2022-12-19 DIAGNOSIS — R112 Nausea with vomiting, unspecified: Secondary | ICD-10-CM | POA: Diagnosis not present

## 2022-12-19 DIAGNOSIS — N400 Enlarged prostate without lower urinary tract symptoms: Secondary | ICD-10-CM

## 2022-12-19 DIAGNOSIS — G8929 Other chronic pain: Secondary | ICD-10-CM

## 2022-12-19 DIAGNOSIS — N3289 Other specified disorders of bladder: Secondary | ICD-10-CM | POA: Diagnosis not present

## 2022-12-19 DIAGNOSIS — R739 Hyperglycemia, unspecified: Secondary | ICD-10-CM | POA: Diagnosis not present

## 2022-12-19 DIAGNOSIS — R1084 Generalized abdominal pain: Secondary | ICD-10-CM | POA: Insufficient documentation

## 2022-12-19 LAB — RESP PANEL BY RT-PCR (RSV, FLU A&B, COVID)  RVPGX2
Influenza A by PCR: NEGATIVE
Influenza B by PCR: NEGATIVE
Resp Syncytial Virus by PCR: NEGATIVE
SARS Coronavirus 2 by RT PCR: NEGATIVE

## 2022-12-19 LAB — BLOOD GAS, VENOUS
Acid-base deficit: 4.4 mmol/L — ABNORMAL HIGH (ref 0.0–2.0)
Bicarbonate: 21 mmol/L (ref 20.0–28.0)
O2 Saturation: 52.4 %
Patient temperature: 37
pCO2, Ven: 39 mmHg — ABNORMAL LOW (ref 44–60)
pH, Ven: 7.34 (ref 7.25–7.43)
pO2, Ven: <31 mmHg — CL (ref 32–45)

## 2022-12-19 LAB — COMPREHENSIVE METABOLIC PANEL
ALT: 30 U/L (ref 0–44)
AST: 27 U/L (ref 15–41)
Albumin: 5.4 g/dL — ABNORMAL HIGH (ref 3.5–5.0)
Alkaline Phosphatase: 108 U/L (ref 38–126)
Anion gap: 7 (ref 5–15)
BUN: 19 mg/dL (ref 6–20)
CO2: 21 mmol/L — ABNORMAL LOW (ref 22–32)
Calcium: 9.6 mg/dL (ref 8.9–10.3)
Chloride: 108 mmol/L (ref 98–111)
Creatinine, Ser: 1.85 mg/dL — ABNORMAL HIGH (ref 0.61–1.24)
GFR, Estimated: 44 mL/min — ABNORMAL LOW (ref >60)
Glucose, Bld: 245 mg/dL — ABNORMAL HIGH (ref 70–99)
Potassium: 4.3 mmol/L (ref 3.5–5.1)
Sodium: 136 mmol/L (ref 135–145)
Total Bilirubin: 1.3 mg/dL — ABNORMAL HIGH (ref 0.3–1.2)
Total Protein: 8.8 g/dL — ABNORMAL HIGH (ref 6.5–8.1)

## 2022-12-19 LAB — CBC WITH DIFFERENTIAL/PLATELET
Abs Immature Granulocytes: 0.05 10*3/uL (ref 0.00–0.07)
Basophils Absolute: 0 10*3/uL (ref 0.0–0.1)
Basophils Relative: 0 %
Eosinophils Absolute: 0 10*3/uL (ref 0.0–0.5)
Eosinophils Relative: 0 %
HCT: 38.7 % — ABNORMAL LOW (ref 39.0–52.0)
Hemoglobin: 13.5 g/dL (ref 13.0–17.0)
Immature Granulocytes: 1 %
Lymphocytes Relative: 6 %
Lymphs Abs: 0.4 10*3/uL — ABNORMAL LOW (ref 0.7–4.0)
MCH: 32.6 pg (ref 26.0–34.0)
MCHC: 34.9 g/dL (ref 30.0–36.0)
MCV: 93.5 fL (ref 80.0–100.0)
Monocytes Absolute: 0.2 10*3/uL (ref 0.1–1.0)
Monocytes Relative: 3 %
Neutro Abs: 6.1 10*3/uL (ref 1.7–7.7)
Neutrophils Relative %: 90 %
Platelets: 184 10*3/uL (ref 150–400)
RBC: 4.14 MIL/uL — ABNORMAL LOW (ref 4.22–5.81)
RDW: 13.2 % (ref 11.5–15.5)
WBC: 6.8 10*3/uL (ref 4.0–10.5)
nRBC: 0 % (ref 0.0–0.2)

## 2022-12-19 LAB — URINALYSIS, ROUTINE W REFLEX MICROSCOPIC
Bacteria, UA: NONE SEEN
Bilirubin Urine: NEGATIVE
Glucose, UA: >=500 mg/dL — AB
Ketones, ur: 20 mg/dL — AB
Leukocytes,Ua: NEGATIVE
Nitrite: NEGATIVE
Protein, ur: 30 mg/dL — AB
Specific Gravity, Urine: 1.016 (ref 1.005–1.030)
pH: 6 (ref 5.0–8.0)

## 2022-12-19 LAB — LIPASE, BLOOD: Lipase: 30 U/L (ref 11–51)

## 2022-12-19 MED ORDER — DROPERIDOL 2.5 MG/ML IJ SOLN: 2.5000 mg | Freq: Once | INTRAMUSCULAR | Status: DC

## 2022-12-19 MED ORDER — OXYCODONE HCL 5 MG PO TABS
5.0000 mg | ORAL_TABLET | Freq: Once | ORAL | Status: AC
  Administered 2022-12-19: 5 mg via ORAL
  Filled 2022-12-19: qty 1

## 2022-12-19 MED ORDER — DROPERIDOL 2.5 MG/ML IJ SOLN
1.2500 mg | Freq: Once | INTRAMUSCULAR | Status: AC
  Administered 2022-12-19: 1.25 mg via INTRAVENOUS
  Filled 2022-12-19: qty 2

## 2022-12-19 MED ORDER — DROPERIDOL 2.5 MG/ML IJ SOLN: 1.2500 mg | Freq: Once | INTRAMUSCULAR | Status: DC

## 2022-12-19 MED ORDER — LACTATED RINGERS IV BOLUS
500.0000 mL | Freq: Once | INTRAVENOUS | Status: AC
  Administered 2022-12-19: 500 mL via INTRAVENOUS

## 2022-12-19 MED ORDER — DICYCLOMINE HCL 10 MG PO CAPS
10.0000 mg | ORAL_CAPSULE | Freq: Once | ORAL | Status: AC
  Administered 2022-12-19: 10 mg via ORAL
  Filled 2022-12-19: qty 1

## 2022-12-19 MED ORDER — DICYCLOMINE HCL 20 MG PO TABS: 20.0000 mg | ORAL_TABLET | Freq: Two times a day (BID) | ORAL | 0 refills | Status: DC | PRN

## 2022-12-19 MED ORDER — PROMETHAZINE HCL 25 MG RE SUPP: 25.0000 mg | Freq: Four times a day (QID) | RECTAL | 0 refills | Status: DC | PRN

## 2022-12-19 MED ORDER — CARVEDILOL 12.5 MG PO TABS
12.5000 mg | ORAL_TABLET | Freq: Once | ORAL | Status: AC
  Administered 2022-12-19: 12.5 mg via ORAL
  Filled 2022-12-19: qty 1

## 2022-12-19 MED ORDER — ACETAMINOPHEN 500 MG PO TABS
1000.0000 mg | ORAL_TABLET | Freq: Once | ORAL | Status: AC
  Administered 2022-12-19: 1000 mg via ORAL
  Filled 2022-12-19: qty 2

## 2022-12-19 MED ORDER — DICYCLOMINE HCL 10 MG/ML IM SOLN
10.0000 mg | Freq: Once | INTRAMUSCULAR | Status: DC
  Filled 2022-12-19: qty 2

## 2022-12-19 MED ORDER — HYDROMORPHONE HCL 1 MG/ML IJ SOLN: 1.0000 mg | Freq: Once | INTRAMUSCULAR | Status: DC

## 2022-12-19 NOTE — ED Notes (Signed)
Gave patient a cup of ice water and he drank it with no problem.

## 2022-12-19 NOTE — Discharge Instructions (Signed)
  You have been seen in the Emergency Department (ED) for abdominal pain. Your workup was generally reassuring; however, it did not identify a clear cause of your symptoms.  Please follow up with your primary care doctor as soon as possible regarding today's ED visit and the symptoms that are bothering you.  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  You also have enlarged prostate.  I have given you urology information for follow up.  Return to the ED if your abdominal pain worsens or fails to improve, you develop bloody vomiting, bloody diarrhea, you are unable to tolerate fluids due to vomiting, fever greater than 101, or any other concerning symptoms.

## 2022-12-19 NOTE — ED Provider Notes (Signed)
[place] EMERGENCY DEPARTMENT AT [facility] Provider Note   CSN: [id number] Arrival date & time: 12/19/22  1433     History  Chief Complaint  Patient presents with   Abdominal Pain    Edward RELPH Sr. is a 52 y.o. male.  With PMH of HTN, DM, thyroid disease, CKD status post kidney transplant on immunosuppression and Eliquis, medication noncompliance presenting with abdominal pain and vomiting and diarrhea.  Patient is a very poor historian.  He tells me he has been dealing with this abdominal pain ongoing for very long time and "nobody knows what is causing it."  He is complaining of generalized abdominal pain associate with nonbloody nonbilious emesis and nonbloody diarrhea.  He is still making urine and is no longer on dialysis.  Denying any pain with urination.  Denying any fevers, CP or SOB.Notable history of chronic abdominal pain and question of cannabinoid hyperemesis.    Abdominal Pain      Home Medications Prior to Admission medications   Medication Sig Start Date End Date Taking? Authorizing Provider  Accu-Chek Softclix Lancets lancets Use as instructed 08/21/20   Philemon Kingdom, MD  acetaminophen (TYLENOL) 500 MG tablet Take 1 tablet (500 mg total) by mouth every 6 (six) hours as needed for moderate pain or mild pain. 12/02/21   Argentina Donovan, PA-C  Alcohol Swabs (B-D SINGLE USE SWABS REGULAR) PADS Use as directed. 01/09/20   [name], MD  apixaban (ELIQUIS) 5 MG TABS tablet Take 5 mg by mouth 2 (two) times daily. 02/07/22   [provider]  atorvastatin (LIPITOR) 40 MG tablet Take 1 tablet (40 mg total) by mouth daily. 07/27/21   [name], MD  Blood Glucose Calibration (MYGLUCOHEALTH CONTROL) SOLN 1 application. by Misc.(Non-Drug; Combo Route) route once a week. Use to check that meter is working properly once weekly. 01/10/22   [provider]  Blood Glucose Monitoring Suppl (ACCU-CHEK GUIDE ME) w/Device KIT Use 3  times daily before meals 08/21/20   Philemon Kingdom, MD  carvedilol (COREG) 12.5 MG tablet Take 12.5 mg by mouth 2 (two) times daily with a meal. 02/03/22   [provider]  cinacalcet (SENSIPAR) 90 MG tablet Take 90 mg by mouth daily. 02/09/21   [provider]  diclofenac Sodium (VOLTAREN) 1 % GEL Apply 4 g topically 4 (four) times daily. 05/13/21   Gatha Mayer, MD  DULoxetine (CYMBALTA) 60 MG capsule Take 1 capsule (60 mg total) by mouth daily. 07/12/22   [name], MD  furosemide (LASIX) 80 MG tablet Take 1 tablet (80 mg total) by mouth 2 (two) times daily. On the days you DO NOT have dialysis 12/02/21   Argentina Donovan, PA-C  glucose blood (ACCU-CHEK GUIDE) test strip Use as instructed 08/21/20   Philemon Kingdom, MD  IRON SUCROSE IV Iron Sucrose (Venofer) 02/24/21 03/17/21  [provider]  levothyroxine (SYNTHROID) 137 MCG tablet Take 1 tablet (137 mcg total) by mouth daily before breakfast. 12/02/21   Thereasa Solo, Dionne Bucy, PA-C  metoCLOPramide (REGLAN) 10 MG tablet Take 1 tablet (10 mg total) by mouth every 6 (six) hours. 03/19/22   Jacqlyn Larsen, PA-C  mycophenolate (MYFORTIC) 180 MG EC tablet Take 180 mg by mouth 2 (two) times daily. 02/15/22   [provider]  ondansetron (ZOFRAN-ODT) 4 MG disintegrating tablet Take 1 tablet (4 mg total) by mouth every 8 (eight) hours as needed for nausea or vomiting. 03/21/22   Long, Wonda Olds, MD  oxyCODONE (ROXICODONE) 5 MG immediate release tablet Take 1 tablet (5 mg total) by mouth every 4 (four) hours as needed for severe pain. 03/15/22   Drenda Freeze, MD  pantoprazole (PROTONIX) 40 MG tablet Take 1 tablet (40 mg total) by mouth daily. 12/02/21   Argentina Donovan, PA-C  polyethylene glycol powder (GLYCOLAX/MIRALAX) 17 GM/SCOOP powder Take by mouth. 01/10/22   [provider]  potassium chloride SA (KLOR-CON M) 20 MEQ tablet Take 2 tablets (40 mEq total) by mouth daily for 3 days. 03/21/22 03/24/22  Long,  Wonda Olds, MD  promethazine (PHENERGAN) 25 MG tablet Take 1 tablet (25 mg total) by mouth every 6 (six) hours as needed for nausea or vomiting. 05/13/21   Gatha Mayer, MD  senna-docusate (SENOKOT-S) 8.6-50 MG tablet Take by mouth. 01/10/22   [provider]  SENSIPAR 30 MG tablet Take 30 mg by mouth daily. 09/22/20   [provider]  sevelamer carbonate (RENVELA) 800 MG tablet Take 1 tablet (800 mg total) by mouth 3 (three) times daily with meals. 05/29/20   Daisy Floro, DO  sildenafil (VIAGRA) 100 MG tablet Take 1 tablet (100 mg total) by mouth daily as needed for erectile dysfunction. 07/12/22   [name], MD  sulfamethoxazole-trimethoprim (BACTRIM) 400-80 MG tablet Take 1 tablet by mouth 3 (three) times a week. 02/18/22   [provider]  tacrolimus (PROGRAF) 1 MG capsule Take 1 mg by mouth 2 (two) times daily. 02/18/22   [provider]  valGANciclovir (VALCYTE) 450 MG tablet Take 450 mg by mouth daily. 02/07/22   [provider]  VELPHORO 500 MG chewable tablet Chew 1,000 mg by mouth 3 (three) times daily. 10/08/21   [provider]      Allergies    Patient has no known allergies.    Review of Systems   Review of Systems  Gastrointestinal:  Positive for abdominal pain.    Physical Exam Updated Vital Signs BP (!) 187/110 (BP Location: Left Arm)   Pulse 87   Temp 98.6 F (37 C) (Oral)   Ht 6' (1.829 m)   Wt 86.2 kg   SpO2 99%   BMI 25.77 kg/m  Physical Exam Constitutional: Alert and oriented.  Laying in bed sleeping comfortably but when woken up thrashing around complaining of abdominal pain Eyes: Conjunctivae are normal. ENT      Head: Normocephalic and atraumatic.      Nose: No congestion.      Mouth/Throat: Mucous membranes are moist.      Neck: No stridor. Cardiovascular: S1, S2,  Normal and symmetric distal pulses are present in all extremities.Warm and well perfused.  Right upper extremity AV fistula with  bruit and thrill Respiratory: Normal respiratory effort. Breath sounds are normal.  O2 sat 99 on RA Gastrointestinal: Soft and nontender.  Reporting generalized tenderness however no rebound, no guarding.  Right lower quadrant surgical scar clean dry and intact. Musculoskeletal: Normal range of motion in all extremities. Neurologic: Normal speech and language.  Moving extremities x 4.  Sensation grossly intact.  No gross focal neurologic deficits are appreciated. Skin: Skin is warm, dry and intact. No rash noted. Psychiatric: Mood and affect are normal. Speech and behavior are normal.  ED Results / Procedures / Treatments   Labs (all labs ordered are listed, but only abnormal results are displayed) Labs Reviewed  COMPREHENSIVE METABOLIC PANEL  CBC WITH DIFFERENTIAL/PLATELET  LIPASE, BLOOD  URINALYSIS, ROUTINE W REFLEX MICROSCOPIC    EKG  None  Radiology No results found.  Procedures Procedures    Medications Ordered in ED Medications  HYDROmorphone (DILAUDID) injection 1 mg (has no administration in time range)  droperidol (INAPSINE) 2.5 MG/ML injection 1.25 mg (has no administration in time range)    ED Course/ Medical Decision Making/ A&P Clinical Course as of 12/19/22 2339  Mon Dec 19, 2022  1623 Labs reviewed by me.  Normal white blood cell count 6.8.  Mild hyperglycemia 245 with bicarbonate 21 likely due to vomiting however no anion gap no acidosis on VBG pH 7.34, not consistent with DKA.  Creatinine 1.85 at baseline.  Lipase within normal limits and no transaminitis doubt pancreatitis or gallbladder pathology. [VB]  2101 Patient also had RVP negative.  Chest x-ray reviewed by me no pneumonia.  CTAP obtained with no evidence of acute findings.  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  Chronic seroma of kidney.  UA with no evidence of UTI.  Mild dehydration with ketones and protein given fluids in ED.  Patient reassessed with no vomiting tolerating p.o.  abdominal exam is benign.  Advise close follow-up with PCP for chronic findings on CT and GI for chronic abdominal pain.  Discharged with Phenergan suppositories and Bentyl. Discharged stable condition. Strict return precautions. [VB]    Clinical Course User Index [VB] Elgie Congo, MD                             Medical Decision Making [name] is a 52 y.o. male.  With PMH of HTN, DM, thyroid disease, CKD status post kidney transplant on immunosuppression and Eliquis, medication noncompliance presenting with abdominal pain and vomiting and diarrhea.   Patient has been worked up multiple times for similar.  Suspect this is likely related to chronic abdominal pain including but not limited to cyclical abdominal pain and vomiting, cannabinoid hyperemesis, gastroenteritis, pancreatitis among multiple other etiologies.  Labs reviewed by me.  Normal white blood cell count 6.8.  Mild hyperglycemia 245 with bicarbonate 21 likely due to vomiting however no anion gap no acidosis on VBG pH 7.34, not consistent with DKA.  Creatinine 1.85 at baseline.  Lipase within normal limits and no transaminitis doubt pancreatitis or gallbladder pathology. [VB]  Patient also had RVP negative.  Chest x-ray reviewed by me no pneumonia.  CTAP obtained with no evidence of acute findings.  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  Chronic seroma of kidney.  UA with no evidence of UTI.  Mild dehydration with ketones and protein given fluids in ED.  Patient reassessed with no vomiting tolerating p.o. abdominal exam is benign.  Advise close follow-up with PCP for chronic findings on CT and GI for chronic abdominal pain.  Discharged with Phenergan suppositories and Bentyl. Discharged stable condition. Strict return precautions. [VB]   Amount and/or Complexity of Data Reviewed External Data Reviewed: notes.    Details: Outpatient PCP note "Chronic abdominal pain: Patient states that he  had an extensive work-up for this with no diagnosis, this has been present years prior to transplant. He reports this is ongoing and was recently hospitalized for this with, again, negative workup in Dec 2023. Also seen in ED on 10/03/22 with negative workup. Bentyl prescribed last visit, not taking. No fevers or leukocytosis. CT done 12/23 showed submucosal edema otherwise normal. Possible mesenteric ischemia, will check vascular duplex. Check H. Pylori. Could be related to constipation and gastroparesis.  Pt examined by Dr. Ulice Dash in clinic today. Recommendations to start PPI bid for heartburn, bowel regimen for constipation and take Reglan QID. Continue phenergan for nausea. Placed urgent referral to GI and scheduled EGD." Labs: ordered. Radiology: ordered.  Risk OTC drugs. Prescription drug management.      Final Clinical Impression(s) / ED Diagnoses Final diagnoses:  None    Rx / DC Orders ED Discharge Orders     None         Elgie Congo, MD 12/19/22 2341

## 2022-12-19 NOTE — ED Triage Notes (Signed)
Patient brought in from home by EMS with c/o L&R ABD pain along with N/V/D. Per EMS patient has HX of GI issues and HX of Kidney transplant. R arm is restricted. 182/102 90 99% RA CBG: 263

## 2022-12-22 ENCOUNTER — Telehealth: Payer: Self-pay

## 2022-12-22 NOTE — Transitions of Care (Post Inpatient/ED Visit) (Signed)
   12/22/2022  Name: Edward [name]. MRN: [id number] DOB: 1971-01-08  Today's TOC FU Call Status: Today's TOC FU Call Status:: Successful TOC FU Call Competed TOC FU Call Complete Date: 12/22/22 (Red on EMMI-ED Discharge Alert Date & Reason: 12/19/22 "Scheduled follow-up appt? No")  Transition Care Management Follow-up Telephone Call Date of Discharge: 12/19/22 Discharge Facility: [facility]) Type of Discharge: Emergency Department Reason for ED Visit: Other: ("enlarged prostate") How have you been since you were released from the hospital?: Worse (patient reports that he continues to have the same abd pain-not able to take any meds-has not been able to keep any food/liquids down for past several days, states he is very weak) Any questions or concerns?: Yes Patient Questions/Concerns:: Discussed current sxs with patient and the need to be evaluated/seen-patient states he wants to go get checked out at [facility] where he got kidney transplant to make sure nothing is wrong with transplant-states he will call a ride and go today to be evaluated Patient Questions/Concerns Addressed: Notified Provider of Patient Questions/Concerns  Items Reviewed: Did you receive and understand the discharge instructions provided?: Yes Medications obtained and verified?: No (pt did not feel up to reviewing meds-states he did not pick up meds ordered in ED) Any new allergies since your discharge?: No Dietary orders reviewed?: NA Do you have support at home?: Yes People in Home: sibling(s) Name of Support/Comfort Primary Source: patient states he lives with his sister but she is not supportive and does not help him out  [place] and Equipment/Supplies: [facility] Ordered?: NA Any new equipment or medical supplies ordered?: NA  Functional Questionnaire: Do you need assistance with bathing/showering or dressing?: No Do you need assistance with meal preparation?: No Do you need  assistance with eating?: No Do you have difficulty maintaining continence: No Do you need assistance with getting out of bed/getting out of a chair/moving?: No Do you have difficulty managing or taking your medications?: No  Follow up appointments reviewed: PCP Follow-up appointment confirmed?: No (patient has not called to make f/u appt-) [facility] Follow-up appointment confirmed?: No Reason Specialist Follow-Up Not Confirmed: Patient has Specialist Provider Number and will Call for Appointment Do you need transportation to your follow-up appointment?: No (patient states he uses transportation provided by insurance) Do you understand care options if your condition(s) worsen?: Yes-patient verbalized understanding   TOC Interventions Today    Flowsheet Row Most Recent Value  TOC Interventions   TOC Interventions Discussed/Reviewed TOC Interventions Discussed, Contacted provider for patient needs      Interventions Today    Flowsheet Row Most Recent Value  General Interventions   General Interventions Discussed/Reviewed General Interventions Discussed, Doctor Visits  Doctor Visits Discussed/Reviewed Doctor Visits Discussed, PCP, Specialist  PCP/Specialist Visits Compliance with follow-up visit  Education Interventions   Education Provided Provided Education  [sx mgmt, s/s worsening condition, when to seek medical attention]  Provided Verbal Education On Nutrition, Medication, Other  Nutrition Interventions   Nutrition Discussed/Reviewed Nutrition Discussed  Pharmacy Interventions   Pharmacy Dicussed/Reviewed Pharmacy Topics Discussed, Medications and their functions  Safety Interventions   Safety Discussed/Reviewed Safety Discussed        Hetty Blend [facility] Care Management Care Management Community Coordinator Direct Phone: [phone] Toll Free: [phone] Fax: [phone]

## 2022-12-23 ENCOUNTER — Emergency Department (HOSPITAL_COMMUNITY)
Admission: EM | Admit: 2022-12-23 | Discharge: 2022-12-24 | Disposition: A | Discharge status: Home / Self Care | Payer: 59 | Attending: Emergency Medicine | Admitting: Emergency Medicine

## 2022-12-23 ENCOUNTER — Other Ambulatory Visit: Payer: Self-pay

## 2022-12-23 DIAGNOSIS — R1084 Generalized abdominal pain: Secondary | ICD-10-CM | POA: Diagnosis not present

## 2022-12-23 DIAGNOSIS — N179 Acute kidney failure, unspecified: Secondary | ICD-10-CM | POA: Diagnosis not present

## 2022-12-23 DIAGNOSIS — R1013 Epigastric pain: Secondary | ICD-10-CM | POA: Diagnosis not present

## 2022-12-23 DIAGNOSIS — E1122 Type 2 diabetes mellitus with diabetic chronic kidney disease: Secondary | ICD-10-CM | POA: Diagnosis not present

## 2022-12-23 DIAGNOSIS — R109 Unspecified abdominal pain: Secondary | ICD-10-CM | POA: Diagnosis not present

## 2022-12-23 DIAGNOSIS — R5383 Other fatigue: Secondary | ICD-10-CM | POA: Diagnosis not present

## 2022-12-23 DIAGNOSIS — Z940 Kidney transplant status: Secondary | ICD-10-CM | POA: Diagnosis not present

## 2022-12-23 DIAGNOSIS — R739 Hyperglycemia, unspecified: Secondary | ICD-10-CM

## 2022-12-23 DIAGNOSIS — E1165 Type 2 diabetes mellitus with hyperglycemia: Secondary | ICD-10-CM | POA: Diagnosis not present

## 2022-12-23 DIAGNOSIS — E860 Dehydration: Secondary | ICD-10-CM | POA: Insufficient documentation

## 2022-12-23 DIAGNOSIS — N186 End stage renal disease: Secondary | ICD-10-CM | POA: Diagnosis not present

## 2022-12-23 DIAGNOSIS — I120 Hypertensive chronic kidney disease with stage 5 chronic kidney disease or end stage renal disease: Secondary | ICD-10-CM | POA: Diagnosis not present

## 2022-12-23 LAB — CBC WITH DIFFERENTIAL/PLATELET
Abs Immature Granulocytes: 0.05 10*3/uL (ref 0.00–0.07)
Basophils Absolute: 0 10*3/uL (ref 0.0–0.1)
Basophils Relative: 0 %
Eosinophils Absolute: 0 10*3/uL (ref 0.0–0.5)
Eosinophils Relative: 0 %
HCT: 42 % (ref 39.0–52.0)
Hemoglobin: 15.1 g/dL (ref 13.0–17.0)
Immature Granulocytes: 1 %
Lymphocytes Relative: 11 %
Lymphs Abs: 0.9 10*3/uL (ref 0.7–4.0)
MCH: 32.3 pg (ref 26.0–34.0)
MCHC: 36 g/dL (ref 30.0–36.0)
MCV: 89.7 fL (ref 80.0–100.0)
Monocytes Absolute: 1 10*3/uL (ref 0.1–1.0)
Monocytes Relative: 12 %
Neutro Abs: 6 10*3/uL (ref 1.7–7.7)
Neutrophils Relative %: 76 %
Platelets: 223 10*3/uL (ref 150–400)
RBC: 4.68 MIL/uL (ref 4.22–5.81)
RDW: 12.7 % (ref 11.5–15.5)
WBC: 8 10*3/uL (ref 4.0–10.5)
nRBC: 0 % (ref 0.0–0.2)

## 2022-12-23 LAB — BLOOD GAS, VENOUS
Acid-base deficit: 3.6 mmol/L — ABNORMAL HIGH (ref 0.0–2.0)
Bicarbonate: 21.5 mmol/L (ref 20.0–28.0)
O2 Saturation: 75.5 %
Patient temperature: 37.1
pCO2, Ven: 38 mmHg — ABNORMAL LOW (ref 44–60)
pH, Ven: 7.36 (ref 7.25–7.43)
pO2, Ven: 42 mmHg (ref 32–45)

## 2022-12-23 LAB — COMPREHENSIVE METABOLIC PANEL
ALT: 23 U/L (ref 0–44)
AST: 27 U/L (ref 15–41)
Albumin: 4.1 g/dL (ref 3.5–5.0)
Alkaline Phosphatase: 78 U/L (ref 38–126)
Anion gap: 11 (ref 5–15)
BUN: 40 mg/dL — ABNORMAL HIGH (ref 6–20)
CO2: 21 mmol/L — ABNORMAL LOW (ref 22–32)
Calcium: 8.9 mg/dL (ref 8.9–10.3)
Chloride: 97 mmol/L — ABNORMAL LOW (ref 98–111)
Creatinine, Ser: 2.47 mg/dL — ABNORMAL HIGH (ref 0.61–1.24)
GFR, Estimated: 31 mL/min — ABNORMAL LOW (ref >60)
Glucose, Bld: 420 mg/dL — ABNORMAL HIGH (ref 70–99)
Potassium: 3.6 mmol/L (ref 3.5–5.1)
Sodium: 129 mmol/L — ABNORMAL LOW (ref 135–145)
Total Bilirubin: 1.8 mg/dL — ABNORMAL HIGH (ref 0.3–1.2)
Total Protein: 7.5 g/dL (ref 6.5–8.1)

## 2022-12-23 LAB — CBG MONITORING, ED
Glucose-Capillary: 428 mg/dL — ABNORMAL HIGH (ref 70–99)
Glucose-Capillary: 345 mg/dL — ABNORMAL HIGH (ref 70–99)

## 2022-12-23 LAB — LIPASE, BLOOD: Lipase: 55 U/L — ABNORMAL HIGH (ref 11–51)

## 2022-12-23 MED ORDER — METOCLOPRAMIDE HCL 5 MG/ML IJ SOLN
10.0000 mg | Freq: Once | INTRAMUSCULAR | Status: AC
  Administered 2022-12-23: 10 mg via INTRAVENOUS
  Filled 2022-12-23: qty 2

## 2022-12-23 MED ORDER — OXYCODONE HCL 5 MG PO TABS: 5.0000 mg | ORAL_TABLET | ORAL | 0 refills | Status: DC | PRN

## 2022-12-23 MED ORDER — HYDROMORPHONE HCL 1 MG/ML IJ SOLN
1.0000 mg | Freq: Once | INTRAMUSCULAR | Status: AC
  Administered 2022-12-23: 1 mg via INTRAVENOUS
  Filled 2022-12-23: qty 1

## 2022-12-23 MED ORDER — DIPHENHYDRAMINE HCL 50 MG/ML IJ SOLN
50.0000 mg | Freq: Once | INTRAMUSCULAR | Status: AC
  Administered 2022-12-23: 50 mg via INTRAVENOUS
  Filled 2022-12-23: qty 1

## 2022-12-23 MED ORDER — METOCLOPRAMIDE HCL 10 MG PO TABS: 10.0000 mg | ORAL_TABLET | Freq: Four times a day (QID) | ORAL | 0 refills | Status: DC | PRN

## 2022-12-23 MED ORDER — SODIUM CHLORIDE 0.9 % IV BOLUS
1000.0000 mL | Freq: Once | INTRAVENOUS | Status: AC
  Administered 2022-12-23: 1000 mL via INTRAVENOUS

## 2022-12-23 MED ORDER — INSULIN ASPART 100 UNIT/ML IJ SOLN
10.0000 [IU] | Freq: Once | INTRAMUSCULAR | Status: AC
  Administered 2022-12-23: 10 [IU] via SUBCUTANEOUS
  Filled 2022-12-23: qty 0.1

## 2022-12-23 NOTE — ED Notes (Signed)
Pt. CBG 428, RN made aware.

## 2022-12-23 NOTE — ED Provider Notes (Signed)
[facility] Provider Note   CSN: [id number] Arrival date & time: 12/23/22  2057     History  Chief Complaint  Patient presents with   Blood Sugar Problem    Edward COHAN Sr. is a 52 y.o. male history of renal transplant on tacrolimus, diabetes, chronic pain, here presenting with hyperglycemia and persistent abdominal pain.  Patient has been having worsening abdominal pain for the last week or so.  Patient was seen in the ED 4 days ago and had an unremarkable CT abdomen pelvis and labs that showed hyperglycemia.  Patient is very resistant to starting any medications for diabetes.  He states that he does not want to be on insulin was on glipizide previously.  Patient states that his sugar has been elevated in the 3-4 100s.  He actually went to [facility] ED earlier today.  His sugar was 400 and his skin gap was normal.  He left without being seen due to wait times.  He came here instead.  The history is provided by the patient.       Home Medications Prior to Admission medications   Medication Sig Start Date End Date Taking? Authorizing Provider  Accu-Chek Softclix Lancets lancets Use as instructed 08/21/20   Philemon Kingdom, MD  acetaminophen (TYLENOL) 500 MG tablet Take 1 tablet (500 mg total) by mouth every 6 (six) hours as needed for moderate pain or mild pain. 12/02/21   Argentina Donovan, PA-C  Alcohol Swabs (B-D SINGLE USE SWABS REGULAR) PADS Use as directed. 01/09/20   [name], MD  apixaban (ELIQUIS) 5 MG TABS tablet Take 5 mg by mouth 2 (two) times daily. 02/07/22   [provider]  atorvastatin (LIPITOR) 40 MG tablet Take 1 tablet (40 mg total) by mouth daily. 07/27/21   [name], MD  Blood Glucose Calibration (MYGLUCOHEALTH CONTROL) SOLN 1 application. by Misc.(Non-Drug; Combo Route) route once a week. Use to check that meter is working properly once weekly. 01/10/22   [provider]   Blood Glucose Monitoring Suppl (ACCU-CHEK GUIDE ME) w/Device KIT Use 3 times daily before meals 08/21/20   Philemon Kingdom, MD  carvedilol (COREG) 12.5 MG tablet Take 12.5 mg by mouth 2 (two) times daily with a meal. 02/03/22   [provider]  cinacalcet (SENSIPAR) 90 MG tablet Take 90 mg by mouth daily. 02/09/21   [provider]  diclofenac Sodium (VOLTAREN) 1 % GEL Apply 4 g topically 4 (four) times daily. 05/13/21   Gatha Mayer, MD  dicyclomine (BENTYL) 20 MG tablet Take 1 tablet (20 mg total) by mouth 2 (two) times daily as needed for spasms. 12/19/22   Elgie Congo, MD  DULoxetine (CYMBALTA) 60 MG capsule Take 1 capsule (60 mg total) by mouth daily. 07/12/22   [name], MD  furosemide (LASIX) 80 MG tablet Take 1 tablet (80 mg total) by mouth 2 (two) times daily. On the days you DO NOT have dialysis 12/02/21   Argentina Donovan, PA-C  glucose blood (ACCU-CHEK GUIDE) test strip Use as instructed 08/21/20   Philemon Kingdom, MD  IRON SUCROSE IV Iron Sucrose (Venofer) 02/24/21 03/17/21  [provider]  levothyroxine (SYNTHROID) 137 MCG tablet Take 1 tablet (137 mcg total) by mouth daily before breakfast. 12/02/21   Thereasa Solo, Dionne Bucy, PA-C  metoCLOPramide (REGLAN) 10 MG tablet Take 1 tablet (10 mg total) by mouth every 6 (six) hours. 03/19/22   Jacqlyn Larsen, PA-C  mycophenolate (MYFORTIC) 180 MG EC tablet Take 180 mg by mouth 2 (two) times daily. 02/15/22   [provider]  ondansetron (ZOFRAN-ODT) 4 MG disintegrating tablet Take 1 tablet (4 mg total) by mouth every 8 (eight) hours as needed for nausea or vomiting. 03/21/22   Long, Wonda Olds, MD  oxyCODONE (ROXICODONE) 5 MG immediate release tablet Take 1 tablet (5 mg total) by mouth every 4 (four) hours as needed for severe pain. 03/15/22   Drenda Freeze, MD  pantoprazole (PROTONIX) 40 MG tablet Take 1 tablet (40 mg total) by mouth daily. 12/02/21   Argentina Donovan, PA-C  polyethylene glycol powder  (GLYCOLAX/MIRALAX) 17 GM/SCOOP powder Take by mouth. 01/10/22   [provider]  potassium chloride SA (KLOR-CON M) 20 MEQ tablet Take 2 tablets (40 mEq total) by mouth daily for 3 days. 03/21/22 03/24/22  Long, Wonda Olds, MD  promethazine (PHENERGAN) 25 MG suppository Place 1 suppository (25 mg total) rectally every 6 (six) hours as needed for nausea or vomiting. 12/19/22   Elgie Congo, MD  promethazine (PHENERGAN) 25 MG tablet Take 1 tablet (25 mg total) by mouth every 6 (six) hours as needed for nausea or vomiting. 05/13/21   Gatha Mayer, MD  senna-docusate (SENOKOT-S) 8.6-50 MG tablet Take by mouth. 01/10/22   [provider]  SENSIPAR 30 MG tablet Take 30 mg by mouth daily. 09/22/20   [provider]  sevelamer carbonate (RENVELA) 800 MG tablet Take 1 tablet (800 mg total) by mouth 3 (three) times daily with meals. 05/29/20   Daisy Floro, DO  sildenafil (VIAGRA) 100 MG tablet Take 1 tablet (100 mg total) by mouth daily as needed for erectile dysfunction. 07/12/22   [name], MD  sulfamethoxazole-trimethoprim (BACTRIM) 400-80 MG tablet Take 1 tablet by mouth 3 (three) times a week. 02/18/22   [provider]  tacrolimus (PROGRAF) 1 MG capsule Take 1 mg by mouth 2 (two) times daily. 02/18/22   [provider]  valGANciclovir (VALCYTE) 450 MG tablet Take 450 mg by mouth daily. 02/07/22   [provider]  VELPHORO 500 MG chewable tablet Chew 1,000 mg by mouth 3 (three) times daily. 10/08/21   [provider]      Allergies    Patient has no known allergies.    Review of Systems   Review of Systems  Gastrointestinal:  Positive for abdominal pain and vomiting.  All other systems reviewed and are negative.   Physical Exam Updated Vital Signs BP 117/82   Pulse 96   Temp 98.5 F (36.9 C) (Oral)   SpO2 100%  Physical Exam Vitals and nursing note reviewed.  Constitutional:      Comments: Chronically ill and  dehydrated  HENT:     Head: Normocephalic.     Nose: Nose normal.     Mouth/Throat:     Mouth: Mucous membranes are dry.  Eyes:     Extraocular Movements: Extraocular movements intact.     Pupils: Pupils are equal, round, and reactive to light.  Cardiovascular:     Rate and Rhythm: Normal rate and regular rhythm.     Pulses: Normal pulses.     Heart sounds: Normal heart sounds.  Pulmonary:     Effort: Pulmonary effort is normal.     Breath sounds: Normal breath sounds.  Abdominal:     General: Abdomen is flat.     Comments: Mild epigastric tenderness  Musculoskeletal:  General: Normal range of motion.     Cervical back: Normal range of motion and neck supple.  Skin:    General: Skin is warm.     Capillary Refill: Capillary refill takes less than 2 seconds.  Neurological:     General: No focal deficit present.     Mental Status: He is oriented to person, place, and time.  Psychiatric:        Mood and Affect: Mood normal.        Behavior: Behavior normal.     ED Results / Procedures / Treatments   Labs (all labs ordered are listed, but only abnormal results are displayed) Labs Reviewed  CBG MONITORING, ED - Abnormal; Notable for the following components:      Result Value   Glucose-Capillary 428 (*)    All other components within normal limits  CBC WITH DIFFERENTIAL/PLATELET  COMPREHENSIVE METABOLIC PANEL  URINALYSIS, ROUTINE W REFLEX MICROSCOPIC  BLOOD GAS, VENOUS  LIPASE, BLOOD    EKG EKG Interpretation  Date/Time:  Friday December 23 2022 21:10:33 EDT Ventricular Rate:  93 PR Interval:  156 QRS Duration: 100 QT Interval:  352 QTC Calculation: 438 R Axis:   -5 Text Interpretation: Sinus rhythm LAE, consider biatrial enlargement LVH with secondary repolarization abnormality Anterior ST elevation, probably due to LVH No significant change since last tracing Confirmed by Wandra Arthurs [phone]) on 12/23/2022 9:16:05 PM  Radiology No results  found.  Procedures Procedures    Medications Ordered in ED Medications  sodium chloride 0.9 % bolus 1,000 mL (has no administration in time range)  HYDROmorphone (DILAUDID) injection 1 mg (has no administration in time range)  metoCLOPramide (REGLAN) injection 10 mg (has no administration in time range)  diphenhydrAMINE (BENADRYL) injection 50 mg (has no administration in time range)  insulin aspart (novoLOG) injection 10 Units (has no administration in time range)    ED Course/ Medical Decision Making/ A&P                             Medical Decision Making Edward MINASSIAN Sr. is a 52 y.o. male here presenting with hyperglycemia and persistent abdominal pain.  Abdominal pain is chronic and he had recent CT abdomen pelvis that was unremarkable.  Of note patient is not very compliant with his medications and very resistant to starting any medications for diabetes.  I think his hyperglycemia likely from medication noncompliance.  I reviewed his labs from [facility] earlier today and it does not seem like he is in DKA.  Plan to get CBC and CMP and VBG and will hydrate patient and give subcu insulin.  10:51 PM I reviewed patient's labs.  Patient's creatinine is up to 2.4 and it was 1.6-2.2 previously.  Patient blood sugar was 420.  After a liter bolus and 10 units of insulin, blood sugar is down to 345.  Patient's anion gap is normal and VBG is unremarkable.  Patient is not vomiting after given Reglan and Benadryl.  Patient states that he is not open to start insulin right now.  He wants to discuss with his transplant surgeon regarding management of his hyperglycemia.  Will discharge home with some Reglan and also patient requests pain medication.  Problems Addressed: Dehydration: acute illness or injury Hyperglycemia: chronic illness or injury with exacerbation, progression, or side effects of treatment Renal transplant, status post: chronic illness or injury  Amount and/or  Complexity of Data Reviewed Labs: ordered.  Decision-making details documented in ED Course. ECG/medicine tests: ordered.  Risk Prescription drug management.    Final Clinical Impression(s) / ED Diagnoses Final diagnoses:  None    Rx / DC Orders ED Discharge Orders     None         Drenda Freeze, MD 12/23/22 2253

## 2022-12-23 NOTE — ED Triage Notes (Signed)
Pt BIB [facility] from home. Pt c/o abdominal pain, nausea and vomiting 1week. Pt reports being unable to hold down fluids or meds.  Pt hx of kidney transplant   120/76 100o2 93HR [id number]

## 2022-12-23 NOTE — ED Triage Notes (Signed)
Pt c/o black tarry stools 2x

## 2022-12-23 NOTE — Discharge Instructions (Addendum)
Your glucose is elevated.  As we discussed, you will need to be on medications for diabetes.  You need to discuss that with your transplant team.  Please eat low-carb diet  Take Reglan for nausea and vomiting.  Stay hydrated  Take oxycodone as needed for pain  See your doctor for follow-up.  You need to call your transplant team or primary care doctor regarding your diabetes  Return to ER if you have worse vomiting or abdominal pain or fever

## 2022-12-23 NOTE — ED Notes (Signed)
Pt. CBG 345, RN made aware.

## 2022-12-24 LAB — URINALYSIS, ROUTINE W REFLEX MICROSCOPIC
Bacteria, UA: NONE SEEN
Bilirubin Urine: NEGATIVE
Glucose, UA: >=500 mg/dL — AB
Hgb urine dipstick: NEGATIVE
Ketones, ur: NEGATIVE mg/dL
Leukocytes,Ua: NEGATIVE
Nitrite: NEGATIVE
Protein, ur: 30 mg/dL — AB
Specific Gravity, Urine: 1.027 (ref 1.005–1.030)
pH: 5 (ref 5.0–8.0)

## 2022-12-27 ENCOUNTER — Telehealth: Payer: Self-pay

## 2022-12-27 NOTE — Patient Outreach (Signed)
  Care Coordination   Follow Up Visit Note   12/27/2022 Name: ABOUBACAR REICHNER Sr. MRN: [id number] DOB: 1971-09-19  Follow up call to check on patient. He voices that physically he is feeling better-abd pain improved." Noted in chart patient went to ED twice after speaking with RN. He states that he is currently dealing with another issue-his brother is in the hospital and "dying". He is his POA and trying to handle his affairs. Support given to patient. Advised patient that a nurse would follow up with him at later time regarding his medical issues. He voiced understanding and appreciation.        Care Coordination Interventions:  Yes, provided   Interventions Today    Flowsheet Row Most Recent Value  General Interventions   General Interventions Discussed/Reviewed General Interventions Discussed, Referral to Nurse, Doctor Visits  [follow up appt scheduled]  Doctor Visits Discussed/Reviewed Doctor Visits Discussed, PCP, Specialist  PCP/Specialist Visits Compliance with follow-up visit        Follow up plan: Referral made to RN CM -appt scheduled    Encounter Outcome:  Pt. Visit Completed   [facility] Care Management Care Management Community Coordinator Direct Phone: [phone] Toll Free: [phone] Fax: [phone]

## 2023-01-05 DIAGNOSIS — G8929 Other chronic pain: Secondary | ICD-10-CM | POA: Diagnosis not present

## 2023-01-17 ENCOUNTER — Ambulatory Visit: Payer: Self-pay

## 2023-01-18 ENCOUNTER — Ambulatory Visit: Payer: 59 | Admitting: Podiatry

## 2023-01-18 NOTE — Patient Outreach (Signed)
Care Coordination   Initial Visit Note   01/17/2023 Name: Edward GRAUMANN Sr. MRN: [id number] DOB: [birth year]  Edward Baltimore Sr. is a 52 y.o. year old male who sees Hoy Register, MD for primary care. I spoke with  Edward Baltimore Sr. by phone today.  What matters to the patients health and wellness today?  Patient will keep all scheduled MD appointments. Patient will schedule a follow up with [facility] as directed.     Goals Addressed             This Visit's Progress    To get a second opinion for cause of stomach pain       Care Coordination Interventions: Evaluation of current treatment plan related to persistent abdominal pain and patient's adherence to plan as established by provider Determined patient has experienced 3 ED visits since 12/19/22 due to having persistent abdominal pain, he reports this pain is chronic and started prior to his renal transplant  Review of patient status, including review of consultant's reports, relevant laboratory and other test results, and medications completed Determined patient is established with [facility] last seen in 2022 Reviewed and discussed completed telephonic follow up with [facility] for a second opinion for symptoms of chronic persistent GI pain, with intermittent vomiting and diarrhea  Per chart review it is noted that patient is scheduled to have an endoscopy on 03/27/23 :30 AM at [facility] per Dr. Angelyn Punt MD Educated patient on how to perform bowel training to help avoid and or improve constipation, educated on indication, usage and frequency of Miralax and to use exactly as directed for best results  Educated patient on importance of eating a well balanced diet avoiding any foods that may worsen his symptoms and to consider adding protein supplement if appetite is poor and food consumption is low       To schedule a follow up with  [facility] as directed       Care Coordination Interventions: Evaluation of current treatment plan related to enlarged prostate and patient's adherence to plan as established by provider Determined patient experienced a recent ED visit on 12/19/22 with a diagnosis of enlarged prostate Reviewed MD recommendations for patient to follow up with [facility] Determined patient has yet to call [facility] to schedule a follow up appointment  Counseled patient on the importance of scheduling this follow up in order to further evaluate his symptoms suggestive of enlarged prostate Assessed for SDOH barriers, patient denies at this time     Interventions Today    Flowsheet Row Most Recent Value  Chronic Disease   Chronic disease during today's visit Other  [abdominal pain, vomiting, impaired bowel elimiantion]  General Interventions   General Interventions Discussed/Reviewed General Interventions Discussed, General Interventions Reviewed, Doctor Visits, Communication with  Doctor Visits Discussed/Reviewed Doctor Visits Discussed, Doctor Visits Reviewed, PCP, Specialist  Communication with Social Work  [name] sent to [employer] [facility]]  Education Interventions   Education Provided Provided Education  Provided Verbal Education On Nutrition, Medication, When to see the doctor  Mental Health Interventions   Mental Health Discussed/Reviewed Mental Health Discussed, Mental Health Reviewed, Grief and Loss, Refer to Social Work for counseling  Refer to Social Work for counseling regarding Anxiety/Coping, Grief and Loss  Nutrition Interventions   Nutrition Discussed/Reviewed Nutrition Discussed, Nutrition Reviewed, Fluid intake, Increaing proteins, Supplmental nutrition  Pharmacy Interventions   Pharmacy Dicussed/Reviewed Medications and their functions,  Pharmacy Topics Discussed, Medication Adherence  Medication Adherence Not taking medication          SDOH assessments and  interventions completed:  No     Care Coordination Interventions:  Yes, provided   Follow up plan: Follow up call scheduled for 02/07/23  PM    Encounter Outcome:  Pt. Visit Completed

## 2023-01-18 NOTE — Patient Instructions (Signed)
Visit Information  Thank you for taking time to visit with me today. Please don't hesitate to contact me if I can be of assistance to you.   Following are the goals we discussed today:   Goals Addressed             This Visit's Progress    To get a second opinion for cause of stomach pain       Care Coordination Interventions: Evaluation of current treatment plan related to persistent abdominal pain and patient's adherence to plan as established by provider Determined patient has experienced 3 ED visits since 12/19/22 due to having persistent abdominal pain, he reports this pain is chronic and started prior to his renal transplant  Review of patient status, including review of consultant's reports, relevant laboratory and other test results, and medications completed Determined patient is established with [facility] last seen in 2022 Reviewed and discussed completed telephonic follow up with [facility] for a second opinion for symptoms of chronic persistent GI pain, with intermittent vomiting and diarrhea  Per chart review it is noted that patient is scheduled to have an endoscopy on 03/27/23 :30 AM at [facility] per Dr. Angelyn Punt MD Educated patient on how to perform bowel training to help avoid and or improve constipation, educated on indication, usage and frequency of Miralax and to use exactly as directed for best results  Educated patient on importance of eating a well balanced diet avoiding any foods that may worsen his symptoms and to consider adding protein supplement if appetite is poor and food consumption is low       To schedule a follow up with [facility] as directed       Care Coordination Interventions: Evaluation of current treatment plan related to enlarged prostate and patient's adherence to plan as established by provider Determined patient experienced a recent ED visit on 12/19/22 with a diagnosis  of enlarged prostate Reviewed MD recommendations for patient to follow up with [facility] Determined patient has yet to call [facility] to schedule a follow up appointment  Counseled patient on the importance of scheduling this follow up in order to further evaluate his symptoms suggestive of enlarged prostate Assessed for SDOH barriers, patient denies at this time            Our next appointment is by telephone on 02/07/23 at 12 PM  Please call the care guide team at [phone] if you need to cancel or reschedule your appointment.   If you are experiencing a Mental Health or Behavioral Health Crisis or need someone to talk to, please call 1-800-273-TALK (toll free, 24 hour hotline) go to [facility] [address], [place] [phone])  Patient verbalizes understanding of instructions and care plan provided today and agrees to view in MyChart. Active MyChart status and patient understanding of how to access instructions and care plan via MyChart confirmed with patient.     Delsa Sale, RN, BSN, CCM Care Management Coordinator [facility] Care Management Direct Phone: [phone]

## 2023-01-19 DIAGNOSIS — Z5181 Encounter for therapeutic drug level monitoring: Secondary | ICD-10-CM | POA: Diagnosis not present

## 2023-01-19 DIAGNOSIS — D849 Immunodeficiency, unspecified: Secondary | ICD-10-CM | POA: Diagnosis not present

## 2023-01-19 DIAGNOSIS — Z4822 Encounter for aftercare following kidney transplant: Secondary | ICD-10-CM | POA: Diagnosis not present

## 2023-01-19 DIAGNOSIS — Z79621 Long term (current) use of calcineurin inhibitor: Secondary | ICD-10-CM | POA: Diagnosis not present

## 2023-01-19 DIAGNOSIS — E039 Hypothyroidism, unspecified: Secondary | ICD-10-CM | POA: Diagnosis not present

## 2023-01-19 DIAGNOSIS — E1121 Type 2 diabetes mellitus with diabetic nephropathy: Secondary | ICD-10-CM | POA: Diagnosis not present

## 2023-01-19 DIAGNOSIS — Z91199 Patient's noncompliance with other medical treatment and regimen due to unspecified reason: Secondary | ICD-10-CM | POA: Diagnosis not present

## 2023-01-19 DIAGNOSIS — Z940 Kidney transplant status: Secondary | ICD-10-CM | POA: Diagnosis not present

## 2023-01-20 ENCOUNTER — Ambulatory Visit (INDEPENDENT_AMBULATORY_CARE_PROVIDER_SITE_OTHER): Payer: 59 | Admitting: Podiatry

## 2023-01-20 ENCOUNTER — Encounter: Payer: Self-pay | Admitting: Podiatry

## 2023-01-20 DIAGNOSIS — E1142 Type 2 diabetes mellitus with diabetic polyneuropathy: Secondary | ICD-10-CM | POA: Diagnosis not present

## 2023-01-20 DIAGNOSIS — M79675 Pain in left toe(s): Secondary | ICD-10-CM | POA: Diagnosis not present

## 2023-01-20 DIAGNOSIS — M79674 Pain in right toe(s): Secondary | ICD-10-CM

## 2023-01-20 DIAGNOSIS — B351 Tinea unguium: Secondary | ICD-10-CM

## 2023-01-20 NOTE — Progress Notes (Signed)
This patient returns to my office for at risk foot care.  This patient requires this care by a professional since this patient will be at risk due to having ESRD, Blind,   and DM with polyneuropathy.  This patient is unable to cut nails himself since the patient cannot reach his nails.These nails are painful walking and wearing shoes.  Patient has kidney transplant. This patient presents for at risk foot care today.  General Appearance  Alert, conversant and in no acute stress.  Vascular  Dorsalis pedis and posterior tibial  pulses are  weakly palpable  bilaterally.  Capillary return is within normal limits  bilaterally. Cold feet. bilaterally.  Neurologic  Senn-Weinstein monofilament wire test diminished  bilaterally. Muscle power within normal limits bilaterally.  Nails Thick disfigured discolored nails with subungual debris  from hallux to fifth toes bilaterally. No evidence of bacterial infection or drainage bilaterally. HAV  B/L.  Orthopedic  No limitations of motion  feet .  No crepitus or effusions noted.  No bony pathology or digital deformities noted.  HAV  B/L.    Skin  normotropic skin with no porokeratosis noted bilaterally.  No signs of infections or ulcers noted.     Onychomycosis  Pain in right toes  Pain in left toes  Consent was obtained for treatment procedures.   Mechanical debridement of nails 1-5  bilaterally performed with a nail nipper.  Filed with dremel without incident. To see Dr.  Allena Katz for foot pain including bunions.   Return office visit  3 months                    Told patient to return for periodic foot care and evaluation due to potential at risk complications.   Helane Gunther DPM

## 2023-01-30 IMAGING — CT CT ABD-PELV W/O CM
2 of 4 series · 17 of 46 positions shown, 19 images · non-contrast
Comparison: 12/01/2020

CLINICAL DATA: Left lower quadrant pain, epigastric pain

EXAM:
CT ABDOMEN AND PELVIS WITHOUT CONTRAST
TECHNIQUE: Multidetector CT imaging of the abdomen and pelvis was performed
following the standard protocol without IV contrast.

[Series 2: axial st · axial · 0.83mm/px · z∈[-216,+194]mm · 14 of 94 slices shown, 16 images]
[im 6/94  soft-tissue]
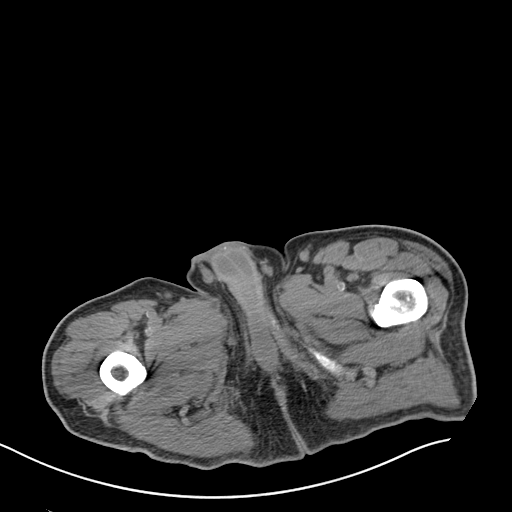
[im 6/94  bone]
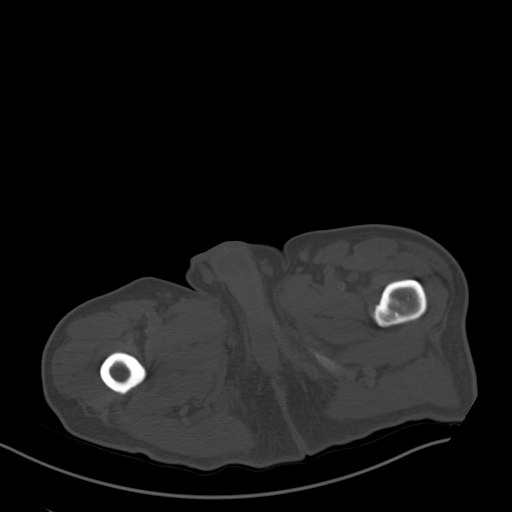
[im 11/94  soft-tissue]
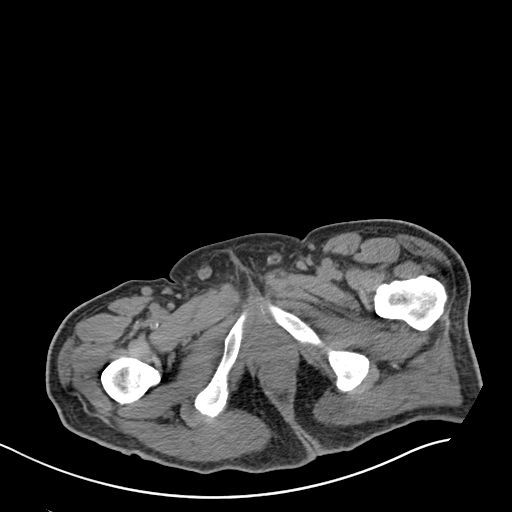
[im 21/94  soft-tissue]
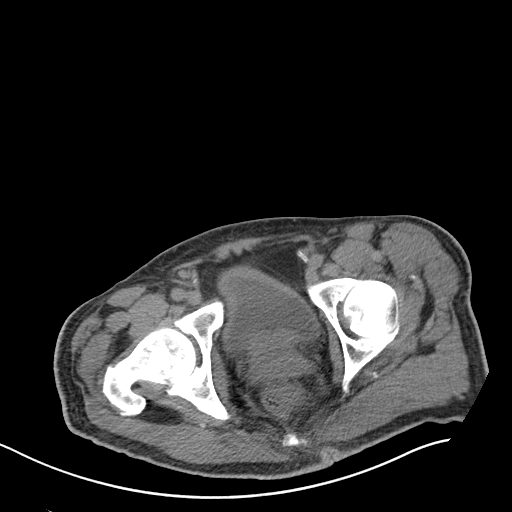
[im 26/94  soft-tissue]
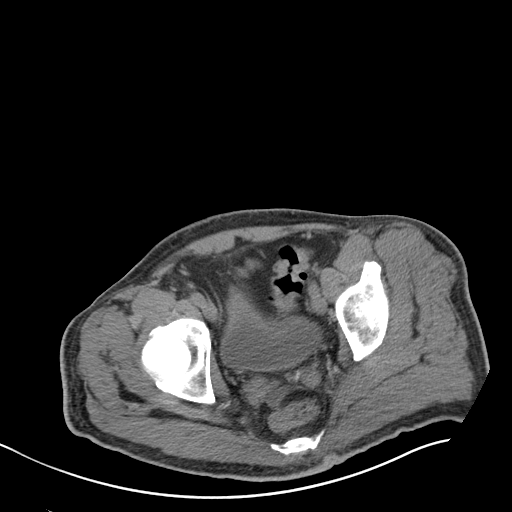
[im 32/94  soft-tissue]
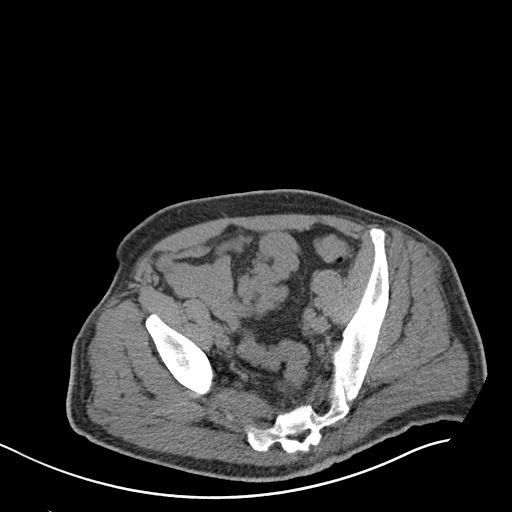
[im 37/94  soft-tissue]
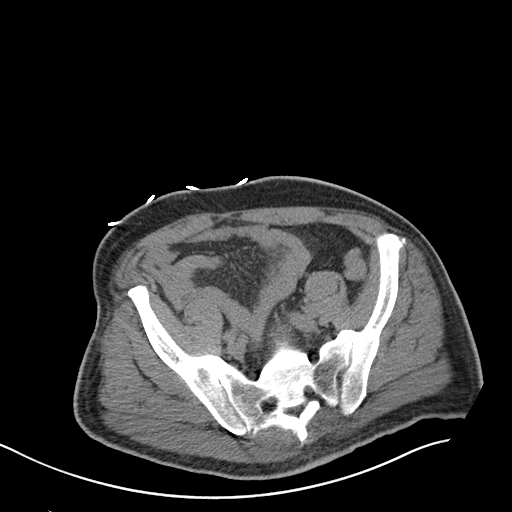
[im 42/94  soft-tissue]
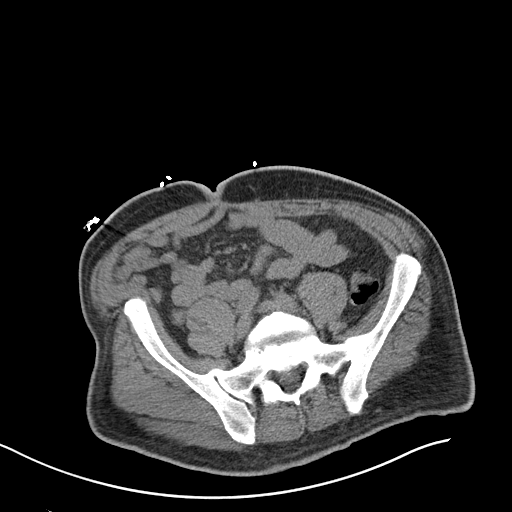
[im 52/94  soft-tissue]
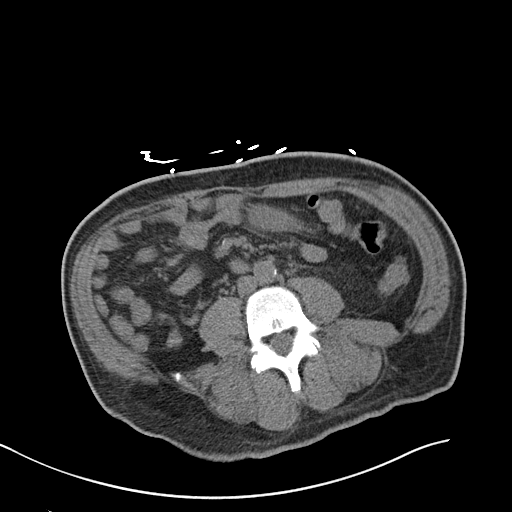
[im 57/94  soft-tissue]
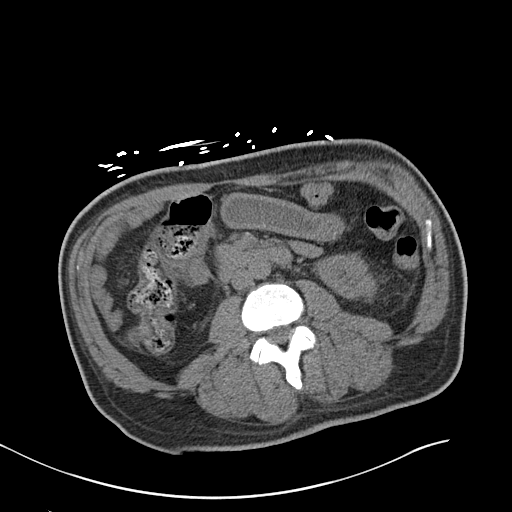
[im 57/94  bone]
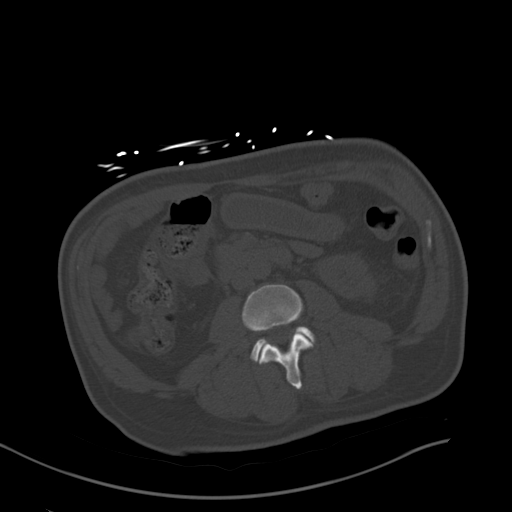
[im 63/94  soft-tissue]
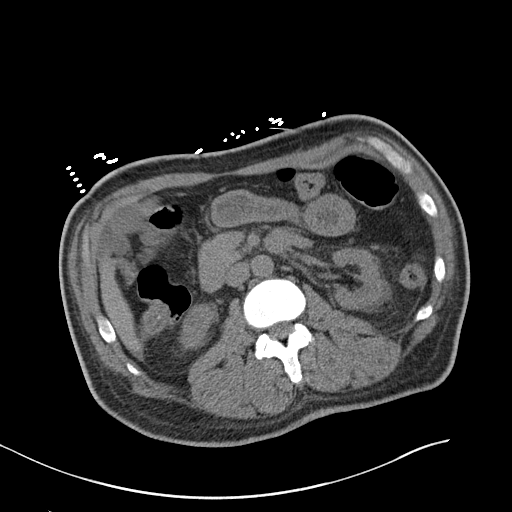
[im 68/94  soft-tissue]
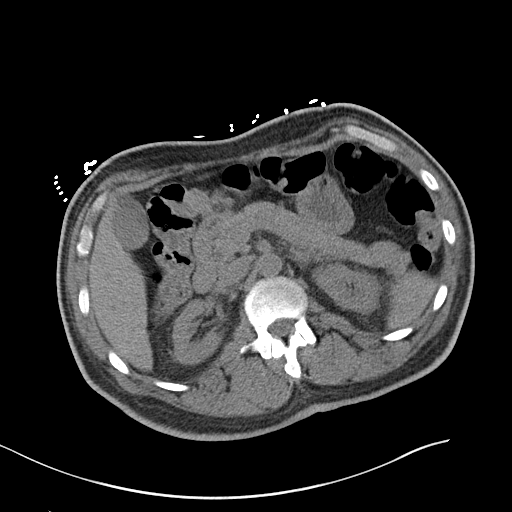
[im 73/94  soft-tissue]
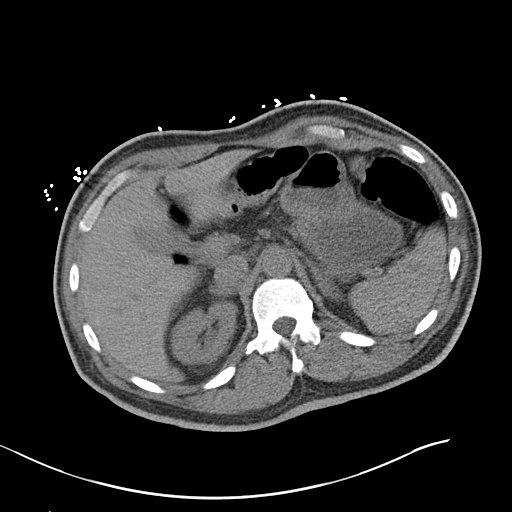
[im 83/94  soft-tissue]
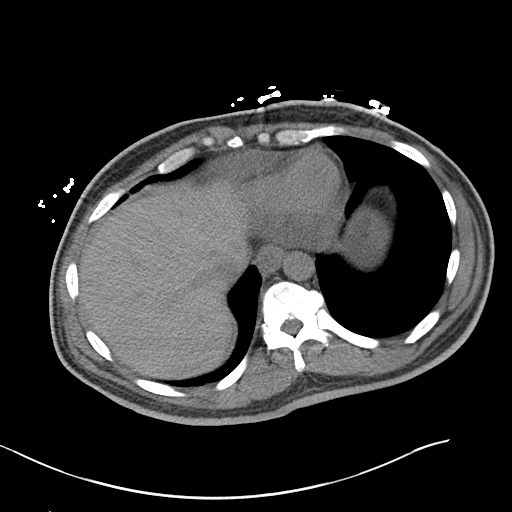
[im 88/94  soft-tissue]
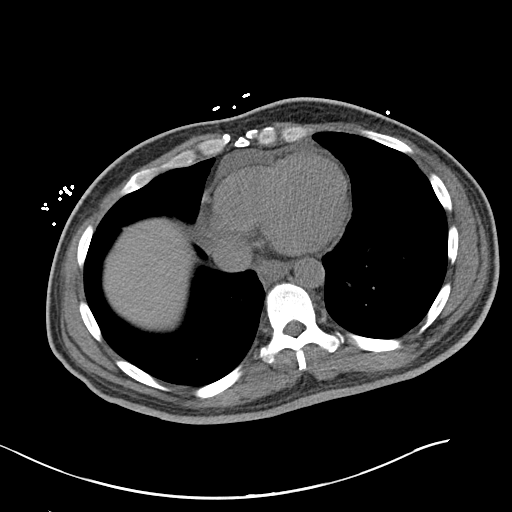

[Series 5: coronal st · coronal · 0.97mm/px · 3 of 156 slices shown]
[im 52/156  soft-tissue]
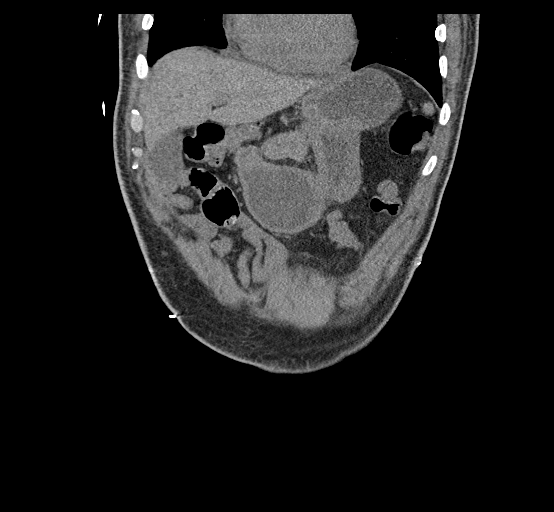
[im 69/156  soft-tissue]
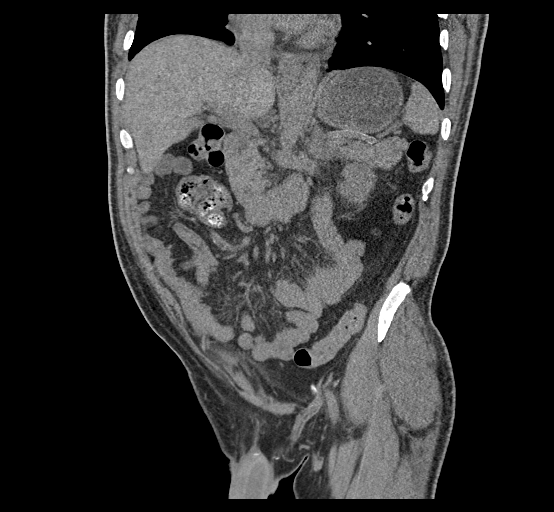
[im 87/156  soft-tissue]
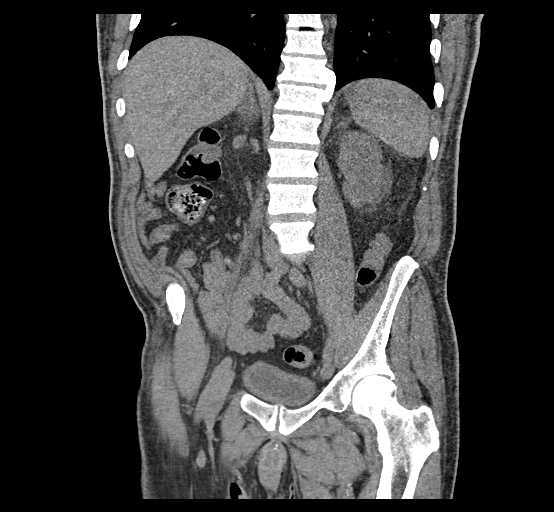

[17 of 46 positions shown; findings below may reference images not displayed]

FINDINGS: Lower chest: Small pericardial effusion as before. No pleural
effusion. Lung bases clear.

Hepatobiliary: No focal liver abnormality is seen. No gallstones,
gallbladder wall thickening, or biliary dilatation.

Pancreas: Unremarkable. No pancreatic ductal dilatation or
surrounding inflammatory changes.

Spleen: Normal in size without focal abnormality.

Adrenals/Urinary Tract: Adrenal glands are unremarkable. Kidneys are
normal, without renal calculi, focal lesion, or hydronephrosis.
Bladder is unremarkable.

Stomach/Bowel: Stomach is partially distended by ingested fluid. The
small bowel is decompressed. Normal appendix. The colon is
nondilated, unremarkable.

Vascular/Lymphatic: Minimal calcified aortic plaque. No abdominal or
pelvic adenopathy.

Reproductive: Prostate is unremarkable.

Other: No ascites.  No free air.

Musculoskeletal: No acute or significant osseous findings.
IMPRESSION: 1. No acute findings.
2. Stable small pericardial effusion.
3.  Aortic Atherosclerosis ([id number]).

## 2023-01-31 DIAGNOSIS — M792 Neuralgia and neuritis, unspecified: Secondary | ICD-10-CM | POA: Diagnosis not present

## 2023-02-02 ENCOUNTER — Ambulatory Visit: Payer: 59 | Attending: Family Medicine

## 2023-02-02 VITALS — Ht 72.0 in | Wt 185.0 lb

## 2023-02-02 DIAGNOSIS — Z0000 Encounter for general adult medical examination without abnormal findings: Secondary | ICD-10-CM | POA: Diagnosis not present

## 2023-02-02 NOTE — Progress Notes (Signed)
I connected with  Edward Baltimore Sr. on 02/02/23 by a audio enabled telemedicine application and verified that I am speaking with the correct person using two identifiers.  Patient Location: Home  Provider Location: Office/Clinic  I discussed the limitations of evaluation and management by telemedicine. The patient expressed understanding and agreed to proceed.  Subjective:   Edward FLEER Sr. is a 52 y.o. male who presents for Medicare Annual/Subsequent preventive examination.  Review of Systems     Cardiac Risk Factors include: advanced age (>75men, >58 women);diabetes mellitus;hypertension;male gender     Objective:    Today's Vitals   02/02/23 1156 02/02/23 1157  Weight: 185 lb (83.9 kg)   Height: 6' (1.829 m)   PainSc:  5    Body mass index is 25.09 kg/m.     02/02/2023   12:09 PM 12/19/2022    2:52 PM 03/19/2022    6:31 AM 03/15/2022    7:26 PM 07/27/2021   10:53 AM 07/19/2021    4:04 AM 07/18/2021    7:37 AM  Advanced Directives  Does Patient Have a Medical Advance Directive? No No No No No No No  Would patient like information on creating a medical advance directive?  No - Patient declined  No - Patient declined  No - Patient declined No - Patient declined    Current Medications (verified) Outpatient Encounter Medications as of 02/02/2023  Medication Sig   Accu-Chek Softclix Lancets lancets Use as instructed   Alcohol Swabs (B-D SINGLE USE SWABS REGULAR) PADS Use as directed.   carvedilol (COREG) 12.5 MG tablet Take 12.5 mg by mouth 2 (two) times daily with a meal.   diclofenac Sodium (VOLTAREN) 1 % GEL Apply 4 g topically 4 (four) times daily.   glipiZIDE (GLUCOTROL XL) 5 MG 24 hr tablet Take 1 tablet by mouth daily.   JANUVIA 25 MG tablet Take 1 tablet by mouth daily.   levothyroxine (SYNTHROID) 137 MCG tablet Take 1 tablet (137 mcg total) by mouth daily before breakfast.   mycophenolate (MYFORTIC) 180 MG EC tablet Take 180 mg by mouth 2 (two)  times daily.   ondansetron (ZOFRAN-ODT) 4 MG disintegrating tablet Take 1 tablet (4 mg total) by mouth every 8 (eight) hours as needed for nausea or vomiting.   oxyCODONE (ROXICODONE) 5 MG immediate release tablet Take 1 tablet (5 mg total) by mouth every 4 (four) hours as needed for severe pain.   pantoprazole (PROTONIX) 40 MG tablet Take 1 tablet (40 mg total) by mouth daily.   polyethylene glycol powder (GLYCOLAX/MIRALAX) 17 GM/SCOOP powder Take by mouth.   pregabalin (LYRICA) 25 MG capsule Take 25 mg (1 capsule) in the morning and afternoon and 50 mg (2 capsules) at bedtime for 10 days. Then increase to 50 mg (2 capsules) in the morning, 25 mg (1 capsule) in the afternoon, and 50 mg (2 capsules) at bedtime for 10 days. Then increase to 50 mg (2 capsules) three times daily after that.   promethazine (PHENERGAN) 25 MG tablet Take 1 tablet (25 mg total) by mouth every 6 (six) hours as needed for nausea or vomiting.   senna-docusate (SENOKOT-S) 8.6-50 MG tablet Take by mouth.   sildenafil (VIAGRA) 100 MG tablet Take 1 tablet (100 mg total) by mouth daily as needed for erectile dysfunction.   tacrolimus (PROGRAF) 1 MG capsule Take 1 mg by mouth 2 (two) times daily.   VELPHORO 500 MG chewable tablet Chew 1,000 mg by mouth 3 (three) times daily.  acetaminophen (TYLENOL) 500 MG tablet Take 1 tablet (500 mg total) by mouth every 6 (six) hours as needed for moderate pain or mild pain. (Patient not taking: Reported on 02/02/2023)   apixaban (ELIQUIS) 5 MG TABS tablet Take 5 mg by mouth 2 (two) times daily. (Patient not taking: Reported on 02/02/2023)   atorvastatin (LIPITOR) 40 MG tablet Take 1 tablet (40 mg total) by mouth daily. (Patient not taking: Reported on 02/02/2023)   Blood Glucose Calibration (MYGLUCOHEALTH CONTROL) SOLN 1 application. by Misc.(Non-Drug; Combo Route) route once a week. Use to check that meter is working properly once weekly. (Patient not taking: Reported on 02/02/2023)   Blood Glucose  Monitoring Suppl (ACCU-CHEK GUIDE ME) w/Device KIT Use 3 times daily before meals (Patient not taking: Reported on 02/02/2023)   cinacalcet (SENSIPAR) 90 MG tablet Take 90 mg by mouth daily. (Patient not taking: Reported on 02/02/2023)   dicyclomine (BENTYL) 20 MG tablet Take 1 tablet (20 mg total) by mouth 2 (two) times daily as needed for spasms. (Patient not taking: Reported on 01/17/2023)   DULoxetine (CYMBALTA) 60 MG capsule Take 1 capsule (60 mg total) by mouth daily. (Patient not taking: Reported on 02/02/2023)   furosemide (LASIX) 80 MG tablet Take 1 tablet (80 mg total) by mouth 2 (two) times daily. On the days you DO NOT have dialysis (Patient not taking: Reported on 02/02/2023)   glucose blood (ACCU-CHEK GUIDE) test strip Use as instructed (Patient not taking: Reported on 02/02/2023)   IRON SUCROSE IV Iron Sucrose (Venofer)   metoCLOPramide (REGLAN) 10 MG tablet Take 1 tablet (10 mg total) by mouth every 6 (six) hours as needed for nausea (nausea/headache). (Patient not taking: Reported on 01/17/2023)   potassium chloride SA (KLOR-CON M) 20 MEQ tablet Take 2 tablets (40 mEq total) by mouth daily for 3 days.   promethazine (PHENERGAN) 25 MG suppository Place 1 suppository (25 mg total) rectally every 6 (six) hours as needed for nausea or vomiting. (Patient not taking: Reported on 02/02/2023)   SENSIPAR 30 MG tablet Take 30 mg by mouth daily. (Patient not taking: Reported on 02/02/2023)   sevelamer carbonate (RENVELA) 800 MG tablet Take 1 tablet (800 mg total) by mouth 3 (three) times daily with meals. (Patient not taking: Reported on 02/02/2023)   sulfamethoxazole-trimethoprim (BACTRIM) 400-80 MG tablet Take 1 tablet by mouth 3 (three) times a week. (Patient not taking: Reported on 02/02/2023)   valGANciclovir (VALCYTE) 450 MG tablet Take 450 mg by mouth daily. (Patient not taking: Reported on 02/02/2023)   No facility-administered encounter medications on file as of 02/02/2023.    Allergies (verified) Patient  has no known allergies.   History: Past Medical History:  Diagnosis Date   Acute on chronic renal failure (HCC) 11/25/2015   Anemia    Arthritis    Blind    Left eye   Cataract    CHF (congestive heart failure) (HCC)    Chronic kidney disease    stage 5   COVID-19    Depression    situatuional   Diabetes mellitus    Type II   GERD (gastroesophageal reflux disease)    Graves disease 2014   Headache    in past   Hx of adenomatous and sessile serrated colonic polyps 11/21/2019   Hyperlipidemia    Hypertension    Hypothyroidism    Legally blind    right eye   Neuropathy    Non-compliance    Proteinuria 05/24/2020   Shortness of breath dyspnea    "  Better since I've been taking my medications"   Past Surgical History:  Procedure Laterality Date   AV FISTULA PLACEMENT Right 04/22/2019   Procedure: RIGHT ARM ARTERIOVENOUS (AV) FISTULA CREATION;  Surgeon: Chuck Hint, MD;  Location: [facility] OR;  Service: Vascular;  Laterality: Right;   CIRCUMCISION     as a child, around 72 years of age   COLONOSCOPY     EYE SURGERY Bilateral    "several "   PARS PLANA VITRECTOMY Right 03/25/2016   Procedure: PARS PLANA VITRECTOMY 25 GAUGE FOR ENDOPHTHALMITIS;  Surgeon: Carmela Rima, MD;  Location: [facility] OR;  Service: Ophthalmology;  Laterality: Right;   WISDOM TOOTH EXTRACTION     Family History  Problem Relation Age of Onset   Hypertension Mother    Diabetes Mother    Bladder Cancer Brother    Kidney disease Other    Colon cancer Neg Hx    Rectal cancer Neg Hx    Esophageal cancer Neg Hx    Stomach cancer Neg Hx    Social History   Socioeconomic History   Marital status: Divorced    Spouse name: Not on file   Number of children: 11   Years of education: Not on file   Highest education level: Not on file  Occupational History   Occupation: disabled  Tobacco Use   Smoking status: Former    Packs/day: 0.75    Years: 3.00    Additional pack years: 0.00    Total pack  years: 2.25    Types: Cigarettes    Quit date: 05/28/2000    Years since quitting: 22.6   Smokeless tobacco: Never  Vaping Use   Vaping Use: Never used  Substance and Sexual Activity   Alcohol use: Not Currently    Alcohol/week: 0.0 standard drinks of alcohol    Comment: 1 beer  ocassional   Drug use: Yes    Types: Marijuana    Comment: daily for pain   Sexual activity: Not on file  Other Topics Concern   Not on file  Social History Narrative   Divorced   11 children   71 yo son with him at home   Disabled chef - cafeterias, Cytogeneticist, [employer], etc      Former cigarette smoker, marijuana now, rare alcohol, other drugs   Social Determinants of Health   Financial Resource Strain: Low Risk  (02/02/2023)   Overall Financial Resource Strain (CARDIA)    Difficulty of Paying Living Expenses: Not hard at all  Food Insecurity: Food Insecurity Present (02/02/2023)   Hunger Vital Sign    Worried About Running Out of Food in the Last Year: Sometimes true    Ran Out of Food in the Last Year: Sometimes true  Transportation Needs: No Transportation Needs (02/02/2023)   PRAPARE - Administrator, Civil Service (Medical): No    Lack of Transportation (Non-Medical): No  Physical Activity: Insufficiently Active (02/02/2023)   Exercise Vital Sign    Days of Exercise per Week: 3 days    Minutes of Exercise per Session: 30 min  Stress: No Stress Concern Present (02/02/2023)   [employer] of Occupational Health - Occupational Stress Questionnaire    Feeling of Stress : Not at all  Social Connections: Not on file    Tobacco Counseling Counseling given: Not Answered   Clinical Intake:  Pre-visit preparation completed: Yes  Pain : 0-10 Pain Score: 5  Pain Type: Chronic pain Pain Location: Generalized Pain Descriptors /  Indicators: Aching Pain Onset: More than a month ago Pain Frequency: Constant     Nutritional Status: BMI 25 -29 Overweight Nutritional Risks:  None Diabetes: Yes  How often do you need to have someone help you when you read instructions, pamphlets, or other written materials from your doctor or pharmacy?: 1 - Never  Diabetic? Yes Nutrition Risk Assessment:  Has the patient had any N/V/D within the last 2 months?  No  Does the patient have any non-healing wounds?  No  Has the patient had any unintentional weight loss or weight gain?  No   Diabetes:  Is the patient diabetic?  Yes  If diabetic, was a CBG obtained today?  No  Did the patient bring in their glucometer from home?  No  How often do you monitor your CBG's? Does not.   Financial Strains and Diabetes Management:  Are you having any financial strains with the device, your supplies or your medication? No .  Does the patient want to be seen by Chronic Care Management for management of their diabetes?  No  Would the patient like to be referred to a Nutritionist or for Diabetic Management?  No   Diabetic Exams:  Diabetic Eye Exam: Overdue for diabetic eye exam. Pt has been advised about the importance in completing this exam. Patient advised to call and schedule an eye exam. Diabetic Foot Exam: Completed 2024   Interpreter Needed?: No  Information entered by :: NAllen LPN   Activities of Daily Living    02/02/2023   12:10 PM  In your present state of health, do you have any difficulty performing the following activities:  Hearing? 0  Vision? 1  Comment legally blind  Difficulty concentrating or making decisions? 1  Walking or climbing stairs? 1  Dressing or bathing? 0  Doing errands, shopping? 0  Preparing Food and eating ? N  Using the Toilet? N  In the past six months, have you accidently leaked urine? N  Do you have problems with loss of bowel control? N  Managing your Medications? N  Managing your Finances? N  Housekeeping or managing your Housekeeping? N    Patient Care Team: Hoy Register, MD as PCP - General (Family Medicine) Jodelle Red, MD as PCP - Cardiology (Cardiology)  Indicate any recent Medical Services you may have received from other than [facility] providers in the past year (date may be approximate).     Assessment:   This is a routine wellness examination for Susumu.  Hearing/Vision screen Vision Screening - Comments:: No regular eye exams  Dietary issues and exercise activities discussed: Current Exercise Habits: Home exercise routine, Type of exercise: walking, Time (Minutes): 30, Frequency (Times/Week): 3, Weekly Exercise (Minutes/Week): 90   Goals Addressed             This Visit's Progress    Patient Stated       02/02/2023, wants to get off medications       Depression Screen    02/02/2023   12:10 PM 12/02/2021   10:25 AM 07/27/2021   10:53 AM 09/15/2020   11:17 AM 07/17/2020    9:15 AM 01/22/2020   12:53 PM 11/27/2019    3:00 PM  PHQ 2/9 Scores  PHQ - 2 Score 0 2 3 3 2  0 1  PHQ- 9 Score  14 12 8 7  0 6    Fall Risk    02/02/2023   12:09 PM 12/02/2021   10:24 AM 07/27/2021   10:51  AM 09/15/2020   11:10 AM 01/22/2020   12:54 PM  Fall Risk   Falls in the past year? 1 0 1 0 1  Comment lost balance      Number falls in past yr: 0 0 0 0 0  Injury with Fall? 0 0 0 0 1  Risk for fall due to : Medication side effect;Impaired vision Other (Comment)     Follow up Falls prevention discussed;Education provided;Falls evaluation completed    Falls evaluation completed    FALL RISK PREVENTION PERTAINING TO THE HOME:  Any stairs in or around the home? Yes  If so, are there any without handrails? No  Home free of loose throw rugs in walkways, pet beds, electrical cords, etc? Yes  Adequate lighting in your home to reduce risk of falls? Yes   ASSISTIVE DEVICES UTILIZED TO PREVENT FALLS:  Life alert? No  Use of a cane, walker or w/c? No  Grab bars in the bathroom? No  Shower chair or bench in shower? No  Elevated toilet seat or a handicapped toilet? No   TIMED UP AND GO:  Was the test  performed? No .       Cognitive Function:        02/02/2023   12:13 PM  6CIT Screen  What Year? 0 points  What month? 0 points  What time? 0 points  Count back from 20 0 points  Months in reverse 0 points  Repeat phrase 2 points  Total Score 2 points    Immunizations Immunization History  Administered Date(s) Administered   Influenza,inj,Quad PF,6+ Mos 09/19/2016   Influenza-Unspecified 06/03/2020   PFIZER(Purple Top)SARS-COV-2 Vaccination 07/10/2020   Pneumococcal Polysaccharide-23 09/19/2016   Tdap 02/20/2012    TDAP status: Due, Education has been provided regarding the importance of this vaccine. Advised may receive this vaccine at local pharmacy or Health Dept. Aware to provide a copy of the vaccination record if obtained from local pharmacy or Health Dept. Verbalized acceptance and understanding.  Flu Vaccine status: Up to date  Pneumococcal vaccine status: Up to date  Covid-19 vaccine status: Completed vaccines  Qualifies for Shingles Vaccine? Yes   Zostavax completed No   Shingrix Completed?: No.    Education has been provided regarding the importance of this vaccine. Patient has been advised to call insurance company to determine out of pocket expense if they have not yet received this vaccine. Advised may also receive vaccine at local pharmacy or Health Dept. Verbalized acceptance and understanding.  Screening Tests Health Maintenance  Topic Date Due   Hepatitis C Screening  Never done   Zoster Vaccines- Shingrix (1 of 2) Never done   OPHTHALMOLOGY EXAM  09/30/2020   COVID-19 Vaccine (3 - Pfizer risk series) 04/28/2021   DTaP/Tdap/Td (2 - Td or Tdap) 02/19/2022   HEMOGLOBIN A1C  06/04/2022   FOOT EXAM  06/29/2022   Medicare Annual Wellness (AWV)  07/27/2022   COLONOSCOPY (Pts 45-31yrs Insurance coverage will need to be confirmed)  11/12/2022   INFLUENZA VACCINE  05/04/2023   HIV Screening  Completed   HPV VACCINES  Aged Out    Health  Maintenance  Health Maintenance Due  Topic Date Due   Hepatitis C Screening  Never done   Zoster Vaccines- Shingrix (1 of 2) Never done   OPHTHALMOLOGY EXAM  09/30/2020   COVID-19 Vaccine (3 - Pfizer risk series) 04/28/2021   DTaP/Tdap/Td (2 - Td or Tdap) 02/19/2022   HEMOGLOBIN A1C  06/04/2022   FOOT  EXAM  06/29/2022   Medicare Annual Wellness (AWV)  07/27/2022   COLONOSCOPY (Pts 45-37yrs Insurance coverage will need to be confirmed)  11/12/2022    Colorectal cancer screening: Type of screening: Colonoscopy. Completed 11/13/2019. Repeat every 3 years  Lung Cancer Screening: (Low Dose CT Chest recommended if Age 60-80 years, 30 pack-year currently smoking OR have quit w/in 15years.) does not qualify.   Lung Cancer Screening Referral: no  Additional Screening:  Hepatitis C Screening: does qualify;   Vision Screening: Recommended annual ophthalmology exams for early detection of glaucoma and other disorders of the eye. Is the patient up to date with their annual eye exam?  No  Who is the provider or what is the name of the office in which the patient attends annual eye exams? none If pt is not established with a provider, would they like to be referred to a provider to establish care? No .   Dental Screening: Recommended annual dental exams for proper oral hygiene  Community Resource Referral / Chronic Care Management: CRR required this visit?  No   CCM required this visit?  No      Plan:     I have personally reviewed and noted the following in the patient's chart:   Medical and social history Use of alcohol, tobacco or illicit drugs  Current medications and supplements including opioid prescriptions. Patient is not currently taking opioid prescriptions. Functional ability and status Nutritional status Physical activity Advanced directives List of other physicians Hospitalizations, surgeries, and ER visits in previous 12 months Vitals Screenings to include  cognitive, depression, and falls Referrals and appointments  In addition, I have reviewed and discussed with patient certain preventive protocols, quality metrics, and best practice recommendations. A written personalized care plan for preventive services as well as general preventive health recommendations were provided to patient.     Barb Merino, LPN   03/11/6294   Nurse Notes: none  Due to this being a virtual visit, the after visit summary with patients personalized plan was offered to patient via mail or my-chart.  Patient would like to access on my-chart

## 2023-02-02 NOTE — Patient Instructions (Signed)
Edward [name] , Thank you for taking time to come for your Medicare Wellness Visit. I appreciate your ongoing commitment to your health goals. Please review the following plan we discussed and let me know if I can assist you in the future.   These are the goals we discussed:  Goals      Blood Pressure < 140/90     HEMOGLOBIN A1C < 7.0     Patient Stated     02/02/2023, wants to get off medications     To get a second opinion for cause of stomach pain     Care Coordination Interventions: Evaluation of current treatment plan related to persistent abdominal pain and patient's adherence to plan as established by provider Determined patient has experienced 3 ED visits since 12/19/22 due to having persistent abdominal pain, he reports this pain is chronic and started prior to his renal transplant  Review of patient status, including review of consultant's reports, relevant laboratory and other test results, and medications completed Determined patient is established with [facility] last seen in 2022 Reviewed and discussed completed telephonic follow up with [facility] for a second opinion for symptoms of chronic persistent GI pain, with intermittent vomiting and diarrhea  Per chart review it is noted that patient is scheduled to have an endoscopy on 03/27/23 @11 :30 AM at [facility] per Dr. Angelyn Punt MD Educated patient on how to perform bowel training to help avoid and or improve constipation, educated on indication, usage and frequency of Miralax and to use exactly as directed for best results  Educated patient on importance of eating a well balanced diet avoiding any foods that may worsen his symptoms and to consider adding protein supplement if appetite is poor and food consumption is low       To schedule a follow up with [facility] as directed     Care Coordination Interventions: Evaluation of current treatment plan related  to enlarged prostate and patient's adherence to plan as established by provider Determined patient experienced a recent ED visit on 12/19/22 with a diagnosis of enlarged prostate Reviewed MD recommendations for patient to follow up with [facility] Determined patient has yet to call [facility] to schedule a follow up appointment  Counseled patient on the importance of scheduling this follow up in order to further evaluate his symptoms suggestive of enlarged prostate Assessed for SDOH barriers, patient denies at this time            This is a list of the screening recommended for you and due dates:  Health Maintenance  Topic Date Due   Hepatitis C Screening: USPSTF Recommendation to screen - Ages 42-79 yo.  Never done   Zoster (Shingles) Vaccine (1 of 2) Never done   Eye exam for diabetics  09/30/2020   COVID-19 Vaccine (3 - Pfizer risk series) 04/28/2021   DTaP/Tdap/Td vaccine (2 - Td or Tdap) 02/19/2022   Hemoglobin A1C  06/04/2022   Complete foot exam   06/29/2022   Colon Cancer Screening  11/12/2022   Flu Shot  05/04/2023   Medicare Annual Wellness Visit  02/02/2024   HIV Screening  Completed   HPV Vaccine  Aged Out    Advanced directives: Advance directive discussed with you today.   Conditions/risks identified: none  Next appointment: Follow up in one year for your annual wellness visit   Preventive Care 40-64 Years, Male Preventive care refers to lifestyle choices and  visits with your health care provider that can promote health and wellness. What does preventive care include? A yearly physical exam. This is also called an annual well check. Dental exams once or twice a year. Routine eye exams. Ask your health care provider how often you should have your eyes checked. Personal lifestyle choices, including: Daily care of your teeth and gums. Regular physical activity. Eating a healthy diet. Avoiding tobacco and drug use. Limiting alcohol use. Practicing  safe sex. Taking low-dose aspirin every day starting at age 25. What happens during an annual well check? The services and screenings done by your health care provider during your annual well check will depend on your age, overall health, lifestyle risk factors, and family history of disease. Counseling  Your health care provider may ask you questions about your: Alcohol use. Tobacco use. Drug use. Emotional well-being. Home and relationship well-being. Sexual activity. Eating habits. Work and work Astronomer. Screening  You may have the following tests or measurements: Height, weight, and BMI. Blood pressure. Lipid and cholesterol levels. These may be checked every 5 years, or more frequently if you are over 10 years old. Skin check. Lung cancer screening. You may have this screening every year starting at age 12 if you have a 30-pack-year history of smoking and currently smoke or have quit within the past 15 years. Fecal occult blood test (FOBT) of the stool. You may have this test every year starting at age 71. Flexible sigmoidoscopy or colonoscopy. You may have a sigmoidoscopy every 5 years or a colonoscopy every 10 years starting at age 85. Prostate cancer screening. Recommendations will vary depending on your family history and other risks. Hepatitis C blood test. Hepatitis B blood test. Sexually transmitted disease (STD) testing. Diabetes screening. This is done by checking your blood sugar (glucose) after you have not eaten for a while (fasting). You may have this done every 1-3 years. Discuss your test results, treatment options, and if necessary, the need for more tests with your health care provider. Vaccines  Your health care provider may recommend certain vaccines, such as: Influenza vaccine. This is recommended every year. Tetanus, diphtheria, and acellular pertussis (Tdap, Td) vaccine. You may need a Td booster every 10 years. Zoster vaccine. You may need this after  age 43. Pneumococcal 13-valent conjugate (PCV13) vaccine. You may need this if you have certain conditions and have not been vaccinated. Pneumococcal polysaccharide (PPSV23) vaccine. You may need one or two doses if you smoke cigarettes or if you have certain conditions. Talk to your health care provider about which screenings and vaccines you need and how often you need them. This information is not intended to replace advice given to you by your health care provider. Make sure you discuss any questions you have with your health care provider. Document Released: 10/16/2015 Document Revised: 06/08/2016 Document Reviewed: 07/21/2015 Elsevier Interactive Patient Education  2017 [employer].  Fall Prevention in the Home Falls can cause injuries. They can happen to people of all ages. There are many things you can do to make your home safe and to help prevent falls. What can I do on the outside of my home? Regularly fix the edges of walkways and driveways and fix any cracks. Remove anything that might make you trip as you walk through a door, such as a raised step or threshold. Trim any bushes or trees on the path to your home. Use bright outdoor lighting. Clear any walking paths of anything that might make someone  trip, such as rocks or tools. Regularly check to see if handrails are loose or broken. Make sure that both sides of any steps have handrails. Any raised decks and porches should have guardrails on the edges. Have any leaves, snow, or ice cleared regularly. Use sand or salt on walking paths during winter. Clean up any spills in your garage right away. This includes oil or grease spills. What can I do in the bathroom? Use night lights. Install grab bars by the toilet and in the tub and shower. Do not use towel bars as grab bars. Use non-skid mats or decals in the tub or shower. If you need to sit down in the shower, use a plastic, non-slip stool. Keep the floor dry. Clean up any  water that spills on the floor as soon as it happens. Remove soap buildup in the tub or shower regularly. Attach bath mats securely with double-sided non-slip rug tape. Do not have throw rugs and other things on the floor that can make you trip. What can I do in the bedroom? Use night lights. Make sure that you have a light by your bed that is easy to reach. Do not use any sheets or blankets that are too big for your bed. They should not hang down onto the floor. Have a firm chair that has side arms. You can use this for support while you get dressed. Do not have throw rugs and other things on the floor that can make you trip. What can I do in the kitchen? Clean up any spills right away. Avoid walking on wet floors. Keep items that you use a lot in easy-to-reach places. If you need to reach something above you, use a strong step stool that has a grab bar. Keep electrical cords out of the way. Do not use floor polish or wax that makes floors slippery. If you must use wax, use non-skid floor wax. Do not have throw rugs and other things on the floor that can make you trip. What can I do with my stairs? Do not leave any items on the stairs. Make sure that there are handrails on both sides of the stairs and use them. Fix handrails that are broken or loose. Make sure that handrails are as long as the stairways. Check any carpeting to make sure that it is firmly attached to the stairs. Fix any carpet that is loose or worn. Avoid having throw rugs at the top or bottom of the stairs. If you do have throw rugs, attach them to the floor with carpet tape. Make sure that you have a light switch at the top of the stairs and the bottom of the stairs. If you do not have them, ask someone to add them for you. What else can I do to help prevent falls? Wear shoes that: Do not have high heels. Have rubber bottoms. Are comfortable and fit you well. Are closed at the toe. Do not wear sandals. If you use a  stepladder: Make sure that it is fully opened. Do not climb a closed stepladder. Make sure that both sides of the stepladder are locked into place. Ask someone to hold it for you, if possible. Clearly mark and make sure that you can see: Any grab bars or handrails. First and last steps. Where the edge of each step is. Use tools that help you move around (mobility aids) if they are needed. These include: Canes. Walkers. Scooters. Crutches. Turn on the lights when you go  into a dark area. Replace any light bulbs as soon as they burn out. Set up your furniture so you have a clear path. Avoid moving your furniture around. If any of your floors are uneven, fix them. If there are any pets around you, be aware of where they are. Review your medicines with your doctor. Some medicines can make you feel dizzy. This can increase your chance of falling. Ask your doctor what other things that you can do to help prevent falls. This information is not intended to replace advice given to you by your health care provider. Make sure you discuss any questions you have with your health care provider. Document Released: 07/16/2009 Document Revised: 02/25/2016 Document Reviewed: 10/24/2014 Elsevier Interactive Patient Education  2017 [employer].

## 2023-02-07 ENCOUNTER — Ambulatory Visit: Payer: Self-pay

## 2023-02-07 NOTE — Patient Outreach (Signed)
Care Coordination   Follow Up Visit Note   02/07/2023 Name: Edward FIQUEROA Sr. MRN: [id number] DOB: [birth year]  Edward Baltimore Sr. is a 52 y.o. year old male who sees Hoy Register, MD for primary care. I spoke with  Edward Baltimore Sr. by phone today.  What matters to the patients health and wellness today?  Patient will call [facility] to schedule a follow up appointment. Patient would like to resume statin therapy for Aortic Atherosclerosis.     Goals Addressed             This Visit's Progress    To get a second opinion for cause of stomach pain       Care Coordination Interventions: Evaluation of current treatment plan related to persistent abdominal pain and patient's adherence to plan as established by provider Reviewed and discussed with patient his recent follow up with pain management Reviewed provider established plan for pain management Discussed importance of adherence to all scheduled medical appointments Counseled on the importance of reporting any/all new or changed pain symptoms or management strategies to pain management provider Advised patient to report to care team affect of pain on daily activities Reviewed with patient prescribed pharmacological and nonpharmacological pain relief strategies Advised patient to discuss adding Cymbalta as previously discussed  with provider Printed and mailed letter to patient regarding upcoming scheduled MD appointments to help patient keep up with his scheduled appointments         To resume statin therapy       Care Coordination Interventions: Evaluation of current treatment plan related to Aortic Atherosclerosis and patient's adherence to plan as established by provider Discussed with patient, incidental CT finding of Aortic Atherosclerosis Educated patient about the basic disease process related to this condition Determined patient was previously prescribed Atorvastatin but discontinued it a while  back  Discussed patient is ready to resume this therapy to help manage this condition Reviewed upcoming scheduled follow up with Dr. Alvis Lemmings, instructed patient to discuss this further with PCP during upcoming visit and patient is agreeable Routed note care plan to Dr. Alvis Lemmings to advise of patient's desire to resume statin therapy for treatment of Aortic Atherosclerosis        To schedule a follow up with [facility] as directed       Care Coordination Interventions: Evaluation of current treatment plan related to enlarged prostate and patient's adherence to plan as established by provider Discussed with patient that he has not scheduled a follow up with [facility] of enlarged prostate gland  Review of patient status, including review of consultant's reports, relevant laboratory and other test results Determined patient believes he was seen by this provider in the past but did not follow up Provided patient with the contact number, address and hours of operation and encouraged him to contact this provider today to schedule a follow up due to recent abdominal CT revealed moderately large prostate gland  Advised patient if he is unable to schedule due to length of time since last follow up, he and request a new referral from PCP during upcoming visit, patient verbalizes understanding and is agreeable      Interventions Today    Flowsheet Row Most Recent Value  Chronic Disease   Chronic disease during today's visit Other  [chronic pain, enlarged prostate]  General Interventions   General Interventions Discussed/Reviewed General Interventions Discussed, General Interventions Reviewed, Doctor Visits  Doctor Visits Discussed/Reviewed Doctor Visits Discussed, Doctor Visits Reviewed, PCP, Specialist  PCP/Specialist Visits Contact provider for referral to  Contacted provider for referral to Specialist  [facility] if needed]  Education Interventions   Education Provided Provided  Education  Provided Verbal Education On Medication, When to see the doctor  Pharmacy Interventions   Pharmacy Dicussed/Reviewed Pharmacy Topics Discussed, Pharmacy Topics Reviewed, Medications and their functions          SDOH assessments and interventions completed:  No     Care Coordination Interventions:  Yes, provided   Follow up plan: Follow up call scheduled for 03/15/23 @12 :30 PM    Encounter Outcome:  Pt. Visit Completed

## 2023-02-07 NOTE — Patient Instructions (Signed)
Visit Information  Thank you for taking time to visit with me today. Please don't hesitate to contact me if I can be of assistance to you.   Following are the goals we discussed today:   Goals Addressed             This Visit's Progress    To get a second opinion for cause of stomach pain       Care Coordination Interventions: Evaluation of current treatment plan related to persistent abdominal pain and patient's adherence to plan as established by provider Reviewed and discussed with patient his recent follow up with pain management Reviewed provider established plan for pain management Discussed importance of adherence to all scheduled medical appointments Counseled on the importance of reporting any/all new or changed pain symptoms or management strategies to pain management provider Advised patient to report to care team affect of pain on daily activities Reviewed with patient prescribed pharmacological and nonpharmacological pain relief strategies Advised patient to discuss adding Cymbalta as previously discussed  with provider Printed and mailed letter to patient regarding upcoming scheduled MD appointments to help patient keep up with his scheduled appointments         To resume statin therapy       Care Coordination Interventions: Evaluation of current treatment plan related to Aortic Atherosclerosis and patient's adherence to plan as established by provider Discussed with patient, incidental CT finding of Aortic Atherosclerosis Educated patient about the basic disease process related to this condition Determined patient was previously prescribed Atorvastatin but discontinued it a while back  Discussed patient is ready to resume this therapy to help manage this condition Reviewed upcoming scheduled follow up with Dr. Alvis Lemmings, instructed patient to discuss this further with PCP during upcoming visit and patient is agreeable Routed note care plan to Dr. Alvis Lemmings to advise of  patient's desire to resume statin therapy for treatment of Aortic Atherosclerosis        To schedule a follow up with [facility] as directed       Care Coordination Interventions: Evaluation of current treatment plan related to enlarged prostate and patient's adherence to plan as established by provider Discussed with patient that he has not scheduled a follow up with [facility] of enlarged prostate gland  Review of patient status, including review of consultant's reports, relevant laboratory and other test results Determined patient believes he was seen by this provider in the past but did not follow up Provided patient with the contact number, address and hours of operation and encouraged him to contact this provider today to schedule a follow up due to recent abdominal CT revealed moderately large prostate gland  Advised patient if he is unable to schedule due to length of time since last follow up, he and request a new referral from PCP during upcoming visit, patient verbalizes understanding and is agreeable          Our next appointment is by telephone on 03/15/23 at 12:30 PM  Please call the care guide team at [phone] if you need to cancel or reschedule your appointment.   If you are experiencing a Mental Health or Behavioral Health Crisis or need someone to talk to, please call 1-800-273-TALK (toll free, 24 hour hotline) go to [facility] [address] [place] [phone])  Patient verbalizes understanding of instructions and care plan provided today and agrees to view in MyChart. Active MyChart status and patient understanding of how to access instructions and care  plan via MyChart confirmed with patient.     Delsa Sale, RN, BSN, CCM Care Management Coordinator [facility] Care Management  Direct Phone: [phone]

## 2023-02-16 DIAGNOSIS — D849 Immunodeficiency, unspecified: Secondary | ICD-10-CM | POA: Diagnosis not present

## 2023-02-16 DIAGNOSIS — E1121 Type 2 diabetes mellitus with diabetic nephropathy: Secondary | ICD-10-CM | POA: Diagnosis not present

## 2023-02-16 DIAGNOSIS — I10 Essential (primary) hypertension: Secondary | ICD-10-CM | POA: Diagnosis not present

## 2023-02-16 DIAGNOSIS — Z940 Kidney transplant status: Secondary | ICD-10-CM | POA: Diagnosis not present

## 2023-02-17 ENCOUNTER — Ambulatory Visit (INDEPENDENT_AMBULATORY_CARE_PROVIDER_SITE_OTHER): Payer: 59 | Admitting: Podiatry

## 2023-02-17 DIAGNOSIS — L853 Xerosis cutis: Secondary | ICD-10-CM | POA: Diagnosis not present

## 2023-02-17 DIAGNOSIS — E1142 Type 2 diabetes mellitus with diabetic polyneuropathy: Secondary | ICD-10-CM

## 2023-02-17 DIAGNOSIS — B351 Tinea unguium: Secondary | ICD-10-CM | POA: Diagnosis not present

## 2023-02-17 MED ORDER — CICLOPIROX 8 % EX SOLN: Freq: Every day | CUTANEOUS | 0 refills | Status: DC

## 2023-02-17 MED ORDER — AMMONIUM LACTATE 12 % EX LOTN: 1.0000 | TOPICAL_LOTION | CUTANEOUS | 0 refills | Status: DC | PRN

## 2023-02-17 NOTE — Progress Notes (Signed)
Subjective:  Patient ID: Edward Baltimore Sr., male    DOB: 01-16-1971,  MRN: [id number]  Chief Complaint  Patient presents with   Diabetes    Pt stated that he is a diabetic and his feet are always numb and throbbing skin is dry.     52 y.o. male presents with the above complaint.  Patient presents with bilateral dry skin that has been causing him a lot of problems.  He is a diabetic and they are always numb and throbbing.  He has secondary complaint of onychomycosis to the left second toenail with thickened elongated dystrophic mycotic nails x 1.  He wanted to discuss topical options for that.  He does not want to do anything oral.  He has a lot of systemic medical history.   Review of Systems: Negative except as noted in the HPI. Denies N/V/F/Ch.  Past Medical History:  Diagnosis Date   Acute on chronic renal failure (HCC) 11/25/2015   Anemia    Arthritis    Blind    Left eye   Cataract    CHF (congestive heart failure) (HCC)    Chronic kidney disease    stage 5   COVID-19    Depression    situatuional   Diabetes mellitus    Type II   GERD (gastroesophageal reflux disease)    Graves disease 2014   Headache    in past   Hx of adenomatous and sessile serrated colonic polyps 11/21/2019   Hyperlipidemia    Hypertension    Hypothyroidism    Legally blind    right eye   Neuropathy    Non-compliance    Proteinuria 05/24/2020   Shortness of breath dyspnea    "Better since I've been taking my medications"    Current Outpatient Medications:    ciclopirox (PENLAC) 8 % solution, Apply topically at bedtime. Apply over nail and surrounding skin. Apply daily over previous coat. After seven (7) days, may remove with alcohol and continue cycle., Disp: 6.6 mL, Rfl: 0   Accu-Chek Softclix Lancets lancets, Use as instructed, Disp: 100 each, Rfl: 12   acetaminophen (TYLENOL) 500 MG tablet, Take 1 tablet (500 mg total) by mouth every 6 (six) hours as needed for moderate pain or mild  pain., Disp: 100 tablet, Rfl: 1   Alcohol Swabs (B-D SINGLE USE SWABS REGULAR) PADS, Use as directed., Disp: 100 each, Rfl: 6   ammonium lactate (AMLACTIN DAILY) 12 % lotion, Apply 1 Application topically as needed for dry skin., Disp: 400 g, Rfl: 1   atorvastatin (LIPITOR) 40 MG tablet, Take 1 tablet (40 mg total) by mouth daily., Disp: 30 tablet, Rfl: 6   Blood Glucose Calibration (MYGLUCOHEALTH CONTROL) SOLN, , Disp: , Rfl:    Blood Glucose Monitoring Suppl (ACCU-CHEK GUIDE ME) w/Device KIT, Use 3 times daily before meals, Disp: 1 kit, Rfl: 0   carvedilol (COREG) 12.5 MG tablet, Take 12.5 mg by mouth 2 (two) times daily with a meal., Disp: , Rfl:    cinacalcet (SENSIPAR) 90 MG tablet, Take 90 mg by mouth daily., Disp: , Rfl:    diclofenac Sodium (VOLTAREN) 1 % GEL, Apply 4 g topically 4 (four) times daily., Disp: 100 g, Rfl: 1   dicyclomine (BENTYL) 20 MG tablet, Take 1 tablet (20 mg total) by mouth 2 (two) times daily as needed for spasms., Disp: 20 tablet, Rfl: 0   DULoxetine (CYMBALTA) 60 MG capsule, Take 1 capsule (60 mg total) by mouth daily., Disp: 30 capsule,  Rfl: 3   furosemide (LASIX) 80 MG tablet, Take 1 tablet (80 mg total) by mouth 2 (two) times daily. On the days you DO NOT have dialysis, Disp: 34 tablet, Rfl: 2   glipiZIDE (GLUCOTROL XL) 5 MG 24 hr tablet, Take 1 tablet by mouth daily., Disp: , Rfl:    glucose blood (ACCU-CHEK GUIDE) test strip, Use as instructed, Disp: 100 each, Rfl: 12   IRON SUCROSE IV, Iron Sucrose (Venofer), Disp: , Rfl:    JANUVIA 25 MG tablet, Take 1 tablet by mouth daily., Disp: , Rfl:    levothyroxine (SYNTHROID) 137 MCG tablet, Take 1 tablet (137 mcg total) by mouth daily before breakfast., Disp: 30 tablet, Rfl: 2   metoCLOPramide (REGLAN) 10 MG tablet, Take 1 tablet (10 mg total) by mouth every 6 (six) hours as needed for nausea (nausea/headache)., Disp: 10 tablet, Rfl: 0   mycophenolate (MYFORTIC) 180 MG EC tablet, Take 180 mg by mouth 2 (two) times  daily., Disp: , Rfl:    ondansetron (ZOFRAN-ODT) 4 MG disintegrating tablet, Take 1 tablet (4 mg total) by mouth every 8 (eight) hours as needed for nausea or vomiting., Disp: 20 tablet, Rfl: 0   oxyCODONE (ROXICODONE) 5 MG immediate release tablet, Take 1 tablet (5 mg total) by mouth every 4 (four) hours as needed for severe pain., Disp: 12 tablet, Rfl: 0   pantoprazole (PROTONIX) 40 MG tablet, Take 1 tablet (40 mg total) by mouth daily., Disp: 30 tablet, Rfl: 2   polyethylene glycol powder (GLYCOLAX/MIRALAX) 17 GM/SCOOP powder, Take by mouth., Disp: , Rfl:    potassium chloride SA (KLOR-CON M) 20 MEQ tablet, Take 2 tablets (40 mEq total) by mouth daily for 3 days., Disp: 6 tablet, Rfl: 0   pregabalin (LYRICA) 25 MG capsule, Take 25 mg (1 capsule) in the morning and afternoon and 50 mg (2 capsules) at bedtime for 10 days. Then increase to 50 mg (2 capsules) in the morning, 25 mg (1 capsule) in the afternoon, and 50 mg (2 capsules) at bedtime for 10 days. Then increase to 50 mg (2 capsules) three times daily after that., Disp: , Rfl:    promethazine (PHENERGAN) 25 MG suppository, Place 1 suppository (25 mg total) rectally every 6 (six) hours as needed for nausea or vomiting., Disp: 12 each, Rfl: 0   promethazine (PHENERGAN) 25 MG tablet, Take 1 tablet (25 mg total) by mouth every 6 (six) hours as needed for nausea or vomiting., Disp: 30 tablet, Rfl: 0   senna-docusate (SENOKOT-S) 8.6-50 MG tablet, Take by mouth., Disp: , Rfl:    SENSIPAR 30 MG tablet, Take 30 mg by mouth daily., Disp: , Rfl:    sevelamer carbonate (RENVELA) 800 MG tablet, Take 1 tablet (800 mg total) by mouth 3 (three) times daily with meals., Disp: 90 tablet, Rfl: 0   sildenafil (VIAGRA) 100 MG tablet, Take 1 tablet (100 mg total) by mouth daily as needed for erectile dysfunction., Disp: 10 tablet, Rfl: 1   sulfamethoxazole-trimethoprim (BACTRIM) 400-80 MG tablet, Take 1 tablet by mouth 3 (three) times a week., Disp: , Rfl:     tacrolimus (PROGRAF) 1 MG capsule, Take 1 mg by mouth 2 (two) times daily., Disp: , Rfl:    VELPHORO 500 MG chewable tablet, Chew 1,000 mg by mouth 3 (three) times daily., Disp: , Rfl:   Social History   Tobacco Use  Smoking Status Former   Packs/day: 0.75   Years: 3.00   Additional pack years: 0.00   Total  pack years: 2.25   Types: Cigarettes   Quit date: 05/28/2000   Years since quitting: 22.7  Smokeless Tobacco Never    No Known Allergies Objective:  There were no vitals filed for this visit. There is no height or weight on file to calculate BMI. Constitutional Well developed. Well nourished.  Vascular Dorsalis pedis pulses palpable bilaterally. Posterior tibial pulses palpable bilaterally. Capillary refill normal to all digits.  No cyanosis or clubbing noted. Pedal hair growth normal.  Neurologic Normal speech. Oriented to person, place, and time. Epicritic sensation to light touch grossly present bilaterally.  Dermatologic Nails thickened elongated dystrophic mycotic toenails x 1 left second toe Skin dry skin without fissure/cracking noted or open wound noted.  Orthopedic: Normal joint ROM without pain or crepitus bilaterally. No visible deformities. No bony tenderness.   Radiographs: None Assessment:   1. Type 2 diabetes mellitus with polyneuropathy (HCC)   2. Xerosis of skin   3. Onychomycosis due to dermatophyte   4. Nail fungus    Plan:  Patient was evaluated and treated and all questions answered.  Bilateral xerosis -I explained to the patient the etiology of xerosis and various treatment options were extensively discussed.  I explained to the patient the importance of maintaining moisturization of the skin with application of over-the-counter lotion such as Eucerin or Luciderm.  Given the patient has failed over-the-counter options patient will benefit from ammonium lactate ammonium lactate was sent to the pharmacy I will have asked him to apply twice a  day  Left second digit onychomycosis -Educated the patient on the etiology of onychomycosis and various treatment options associated with improving the fungal load.  I explained to the patient that there is 3 treatment options available to treat the onychomycosis including topical, p.o., laser treatment.  Patient elected to undergo Penlac topical solution to be applied twice daily to 6 monitor for improvement.

## 2023-02-23 ENCOUNTER — Ambulatory Visit: Payer: 59 | Attending: Family Medicine | Admitting: Family Medicine

## 2023-02-23 ENCOUNTER — Encounter: Payer: Self-pay | Admitting: Family Medicine

## 2023-02-23 VITALS — BP 121/72 | HR 72 | Ht 72.0 in | Wt 195.4 lb

## 2023-02-23 DIAGNOSIS — Z940 Kidney transplant status: Secondary | ICD-10-CM

## 2023-02-23 DIAGNOSIS — Z7984 Long term (current) use of oral hypoglycemic drugs: Secondary | ICD-10-CM

## 2023-02-23 DIAGNOSIS — G8929 Other chronic pain: Secondary | ICD-10-CM | POA: Diagnosis not present

## 2023-02-23 DIAGNOSIS — E1169 Type 2 diabetes mellitus with other specified complication: Secondary | ICD-10-CM | POA: Diagnosis not present

## 2023-02-23 DIAGNOSIS — I129 Hypertensive chronic kidney disease with stage 1 through stage 4 chronic kidney disease, or unspecified chronic kidney disease: Secondary | ICD-10-CM | POA: Diagnosis not present

## 2023-02-23 DIAGNOSIS — M5126 Other intervertebral disc displacement, lumbar region: Secondary | ICD-10-CM

## 2023-02-23 DIAGNOSIS — Z125 Encounter for screening for malignant neoplasm of prostate: Secondary | ICD-10-CM

## 2023-02-23 DIAGNOSIS — E1122 Type 2 diabetes mellitus with diabetic chronic kidney disease: Secondary | ICD-10-CM | POA: Diagnosis not present

## 2023-02-23 DIAGNOSIS — M2550 Pain in unspecified joint: Secondary | ICD-10-CM

## 2023-02-23 DIAGNOSIS — R109 Unspecified abdominal pain: Secondary | ICD-10-CM

## 2023-02-23 DIAGNOSIS — N529 Male erectile dysfunction, unspecified: Secondary | ICD-10-CM

## 2023-02-23 MED ORDER — DICLOFENAC SODIUM 1 % EX GEL: 4.0000 g | Freq: Four times a day (QID) | CUTANEOUS | 1 refills | Status: DC

## 2023-02-23 MED ORDER — AMMONIUM LACTATE 12 % EX LOTN: 1.0000 | TOPICAL_LOTION | CUTANEOUS | 1 refills | Status: AC | PRN

## 2023-02-23 MED ORDER — SILDENAFIL CITRATE 100 MG PO TABS: 100.0000 mg | ORAL_TABLET | Freq: Every day | ORAL | 1 refills | Status: DC | PRN

## 2023-02-23 NOTE — Patient Instructions (Signed)

## 2023-02-23 NOTE — Progress Notes (Signed)
Subjective:  Patient ID: Edward Baltimore Sr., male    DOB: 11-12-1970  Age: 52 y.o. MRN: [id number]  CC: Diabetes   HPI Edward TAI Sr. is a 52 y.o. year old male with a history of hypertension, type 2 diabetes mellitus (A1c 9.7), Graves' disease (status post radioactive iodine treatment in 07/2017), ESRD (status post renal transplant at [facility] on 01/06/2022), erectile dysfunction, previous bilateral lower extremity DVT (completed anticoagulation)  Interval History: Last visit with transplant clinic at [facility] was 1 week ago at which time his A1c was 9.7.  He was referred to endocrinology.  Reports he is currently on Januvia and glipizide.  The renal transplant clinic manages his diabetes and hypertension.  His next follow-up appointment with them is in 1 month. His lower abdomen continues to hurt and was present prior to his renal transplant.  He has an upcoming appointment with GI for EGD next month.  Also Complains of pain in his knees, legs are weak and he almost looses his balance sometimes. He has not seen Ortho for this. He also has back pain as sometimes it stiffens up that he cannot move. He received ESI in his back in the past. He has an upcoming appointment with Pain management and is on Lyrica. Past Medical History:  Diagnosis Date   Acute on chronic renal failure (HCC) 11/25/2015   Anemia    Arthritis    Blind    Left eye   Cataract    CHF (congestive heart failure) (HCC)    Chronic kidney disease    stage 5   COVID-19    Depression    situatuional   Diabetes mellitus    Type II   GERD (gastroesophageal reflux disease)    Graves disease 2014   Headache    in past   Hx of adenomatous and sessile serrated colonic polyps 11/21/2019   Hyperlipidemia    Hypertension    Hypothyroidism    Legally blind    right eye   Neuropathy    Non-compliance    Proteinuria 05/24/2020   Shortness of breath dyspnea    "Better since I've been taking my  medications"    Past Surgical History:  Procedure Laterality Date   AV FISTULA PLACEMENT Right 04/22/2019   Procedure: RIGHT ARM ARTERIOVENOUS (AV) FISTULA CREATION;  Surgeon: Chuck Hint, MD;  Location: [facility] OR;  Service: Vascular;  Laterality: Right;   CIRCUMCISION     as a child, around 46 years of age   COLONOSCOPY     EYE SURGERY Bilateral    "several "   PARS PLANA VITRECTOMY Right 03/25/2016   Procedure: PARS PLANA VITRECTOMY 25 GAUGE FOR ENDOPHTHALMITIS;  Surgeon: Carmela Rima, MD;  Location: [facility] OR;  Service: Ophthalmology;  Laterality: Right;   WISDOM TOOTH EXTRACTION      Family History  Problem Relation Age of Onset   Hypertension Mother    Diabetes Mother    Bladder Cancer Brother    Kidney disease Other    Colon cancer Neg Hx    Rectal cancer Neg Hx    Esophageal cancer Neg Hx    Stomach cancer Neg Hx     Social History   Socioeconomic History   Marital status: Divorced    Spouse name: Not on file   Number of children: 11   Years of education: Not on file   Highest education level: Not on file  Occupational History   Occupation: disabled  Tobacco  Use   Smoking status: Former    Packs/day: 0.75    Years: 3.00    Additional pack years: 0.00    Total pack years: 2.25    Types: Cigarettes    Quit date: 05/28/2000    Years since quitting: 22.7   Smokeless tobacco: Never  Vaping Use   Vaping Use: Never used  Substance and Sexual Activity   Alcohol use: Not Currently    Alcohol/week: 0.0 standard drinks of alcohol    Comment: 1 beer  ocassional   Drug use: Yes    Types: Marijuana    Comment: daily for pain   Sexual activity: Not on file  Other Topics Concern   Not on file  Social History Narrative   Divorced   11 children   68 yo son with him at home   Disabled chef - cafeterias, Cytogeneticist, [employer], etc      Former cigarette smoker, marijuana now, rare alcohol, other drugs   Social Determinants of Health   Financial Resource Strain:  Low Risk  (02/02/2023)   Overall Financial Resource Strain (CARDIA)    Difficulty of Paying Living Expenses: Not hard at all  Food Insecurity: Food Insecurity Present (02/02/2023)   Hunger Vital Sign    Worried About Running Out of Food in the Last Year: Sometimes true    Ran Out of Food in the Last Year: Sometimes true  Transportation Needs: No Transportation Needs (02/02/2023)   PRAPARE - Administrator, Civil Service (Medical): No    Lack of Transportation (Non-Medical): No  Physical Activity: Insufficiently Active (02/02/2023)   Exercise Vital Sign    Days of Exercise per Week: 3 days    Minutes of Exercise per Session: 30 min  Stress: No Stress Concern Present (02/02/2023)   [employer] of Occupational Health - Occupational Stress Questionnaire    Feeling of Stress : Not at all  Social Connections: Not on file    No Known Allergies  Outpatient Medications Prior to Visit  Medication Sig Dispense Refill   Accu-Chek Softclix Lancets lancets Use as instructed 100 each 12   acetaminophen (TYLENOL) 500 MG tablet Take 1 tablet (500 mg total) by mouth every 6 (six) hours as needed for moderate pain or mild pain. 100 tablet 1   Alcohol Swabs (B-D SINGLE USE SWABS REGULAR) PADS Use as directed. 100 each 6   atorvastatin (LIPITOR) 40 MG tablet Take 1 tablet (40 mg total) by mouth daily. 30 tablet 6   Blood Glucose Calibration (MYGLUCOHEALTH CONTROL) SOLN      Blood Glucose Monitoring Suppl (ACCU-CHEK GUIDE ME) w/Device KIT Use 3 times daily before meals 1 kit 0   carvedilol (COREG) 12.5 MG tablet Take 12.5 mg by mouth 2 (two) times daily with a meal.     ciclopirox (PENLAC) 8 % solution Apply topically at bedtime. Apply over nail and surrounding skin. Apply daily over previous coat. After seven (7) days, may remove with alcohol and continue cycle. 6.6 mL 0   cinacalcet (SENSIPAR) 90 MG tablet Take 90 mg by mouth daily.     dicyclomine (BENTYL) 20 MG tablet Take 1 tablet (20 mg  total) by mouth 2 (two) times daily as needed for spasms. 20 tablet 0   DULoxetine (CYMBALTA) 60 MG capsule Take 1 capsule (60 mg total) by mouth daily. 30 capsule 3   furosemide (LASIX) 80 MG tablet Take 1 tablet (80 mg total) by mouth 2 (two) times daily. On the days  you DO NOT have dialysis 34 tablet 2   glipiZIDE (GLUCOTROL XL) 5 MG 24 hr tablet Take 1 tablet by mouth daily.     glucose blood (ACCU-CHEK GUIDE) test strip Use as instructed 100 each 12   JANUVIA 25 MG tablet Take 1 tablet by mouth daily.     levothyroxine (SYNTHROID) 137 MCG tablet Take 1 tablet (137 mcg total) by mouth daily before breakfast. 30 tablet 2   metoCLOPramide (REGLAN) 10 MG tablet Take 1 tablet (10 mg total) by mouth every 6 (six) hours as needed for nausea (nausea/headache). 10 tablet 0   mycophenolate (MYFORTIC) 180 MG EC tablet Take 180 mg by mouth 2 (two) times daily.     ondansetron (ZOFRAN-ODT) 4 MG disintegrating tablet Take 1 tablet (4 mg total) by mouth every 8 (eight) hours as needed for nausea or vomiting. 20 tablet 0   oxyCODONE (ROXICODONE) 5 MG immediate release tablet Take 1 tablet (5 mg total) by mouth every 4 (four) hours as needed for severe pain. 12 tablet 0   pantoprazole (PROTONIX) 40 MG tablet Take 1 tablet (40 mg total) by mouth daily. 30 tablet 2   polyethylene glycol powder (GLYCOLAX/MIRALAX) 17 GM/SCOOP powder Take by mouth.     pregabalin (LYRICA) 25 MG capsule Take 25 mg (1 capsule) in the morning and afternoon and 50 mg (2 capsules) at bedtime for 10 days. Then increase to 50 mg (2 capsules) in the morning, 25 mg (1 capsule) in the afternoon, and 50 mg (2 capsules) at bedtime for 10 days. Then increase to 50 mg (2 capsules) three times daily after that.     promethazine (PHENERGAN) 25 MG suppository Place 1 suppository (25 mg total) rectally every 6 (six) hours as needed for nausea or vomiting. 12 each 0   promethazine (PHENERGAN) 25 MG tablet Take 1 tablet (25 mg total) by mouth every 6  (six) hours as needed for nausea or vomiting. 30 tablet 0   senna-docusate (SENOKOT-S) 8.6-50 MG tablet Take by mouth.     SENSIPAR 30 MG tablet Take 30 mg by mouth daily.     sevelamer carbonate (RENVELA) 800 MG tablet Take 1 tablet (800 mg total) by mouth 3 (three) times daily with meals. 90 tablet 0   sulfamethoxazole-trimethoprim (BACTRIM) 400-80 MG tablet Take 1 tablet by mouth 3 (three) times a week.     tacrolimus (PROGRAF) 1 MG capsule Take 1 mg by mouth 2 (two) times daily.     VELPHORO 500 MG chewable tablet Chew 1,000 mg by mouth 3 (three) times daily.     ammonium lactate (AMLACTIN DAILY) 12 % lotion Apply 1 Application topically as needed for dry skin. 400 g 0   apixaban (ELIQUIS) 5 MG TABS tablet Take 5 mg by mouth 2 (two) times daily.     diclofenac Sodium (VOLTAREN) 1 % GEL Apply 4 g topically 4 (four) times daily. 100 g 1   sildenafil (VIAGRA) 100 MG tablet Take 1 tablet (100 mg total) by mouth daily as needed for erectile dysfunction. 10 tablet 1   valGANciclovir (VALCYTE) 450 MG tablet Take 450 mg by mouth daily.     IRON SUCROSE IV Iron Sucrose (Venofer)     potassium chloride SA (KLOR-CON M) 20 MEQ tablet Take 2 tablets (40 mEq total) by mouth daily for 3 days. 6 tablet 0   No facility-administered medications prior to visit.     ROS Review of Systems  Constitutional:  Negative for activity change and appetite  change.  HENT:  Negative for sinus pressure and sore throat.   Eyes:  Positive for visual disturbance.  Respiratory:  Negative for chest tightness, shortness of breath and wheezing.   Cardiovascular:  Negative for chest pain and palpitations.  Gastrointestinal:  Positive for abdominal pain. Negative for abdominal distention and constipation.  Genitourinary: Negative.   Musculoskeletal:        See HPI  Psychiatric/Behavioral:  Negative for behavioral problems and dysphoric mood.     Objective:  BP 121/72   Pulse 72   Ht 6' (1.829 m)   Wt 195 lb 6.4 oz  (88.6 kg)   SpO2 100%   BMI 26.50 kg/m      02/23/2023   10:58 AM 02/02/2023   11:56 AM 12/24/2022   12:00 AM  BP/Weight  Systolic BP 121  409  Diastolic BP 72  84  Wt. (Lbs) 195.4 185   BMI 26.5 kg/m2 25.09 kg/m2       Physical Exam Constitutional:      Appearance: He is well-developed.  Cardiovascular:     Rate and Rhythm: Normal rate.     Heart sounds: Normal heart sounds. No murmur heard. Pulmonary:     Effort: Pulmonary effort is normal.     Breath sounds: Normal breath sounds. No wheezing or rales.  Chest:     Chest wall: No tenderness.  Abdominal:     General: Bowel sounds are normal. There is no distension.     Palpations: Abdomen is soft. There is no mass.     Tenderness: There is no abdominal tenderness.  Musculoskeletal:     Right lower leg: No edema.     Left lower leg: No edema.     Comments: Tenderness on flexion extension of both knees, left greater than right No tenderness on palpation of lumbar spine.  Positive straight leg raise bilaterally  Neurological:     Mental Status: He is alert and oriented to person, place, and time.  Psychiatric:        Mood and Affect: Mood normal.        Latest Ref Rng & Units 12/23/2022    9:47 PM 12/19/2022    3:12 PM 03/20/2022    9:31 PM  CMP  Glucose 70 - 99 mg/dL 811  914  782   BUN 6 - 20 mg/dL 40  19  30   Creatinine 0.61 - 1.24 mg/dL 9.56  2.13  0.86   Sodium 135 - 145 mmol/L 129  136  138   Potassium 3.5 - 5.1 mmol/L 3.6  4.3  3.1   Chloride 98 - 111 mmol/L 97  108  103   CO2 22 - 32 mmol/L 21  21  21    Calcium 8.9 - 10.3 mg/dL 8.9  9.6  9.3   Total Protein 6.5 - 8.1 g/dL 7.5  8.8  7.9   Total Bilirubin 0.3 - 1.2 mg/dL 1.8  1.3  1.5   Alkaline Phos 38 - 126 U/L 78  108  95   AST 15 - 41 U/L 27  27  21    ALT 0 - 44 U/L 23  30  18      Lipid Panel     Component Value Date/Time   CHOL 137 10/21/2019 0854   TRIG 116 10/21/2019 0854   HDL 55 10/21/2019 0854   CHOLHDL 2.5 10/21/2019 0854   CHOLHDL 2.8  09/19/2016 1023   VLDL 37 (H) 09/19/2016 1023   LDLCALC 61 10/21/2019  0854    CBC    Component Value Date/Time   WBC 8.0 12/23/2022 2147   RBC 4.68 12/23/2022 2147   HGB 15.1 12/23/2022 2147   HCT 42.0 12/23/2022 2147   PLT 223 12/23/2022 2147   MCV 89.7 12/23/2022 2147   MCH 32.3 12/23/2022 2147   MCHC 36.0 12/23/2022 2147   RDW 12.7 12/23/2022 2147   LYMPHSABS 0.9 12/23/2022 2147   MONOABS 1.0 12/23/2022 2147   EOSABS 0.0 12/23/2022 2147   BASOSABS 0.0 12/23/2022 2147    Lab Results  Component Value Date   HGBA1C 4.6 12/02/2021   A1c 9.7 from 02/16/2023-care everywhere  Assessment & Plan:  1. Herniated lumbar intervertebral disc Uncontrolled Would love to refer to PT but he would like to see orthopedics instead Apply heat, use Lidoderm patches Currently followed by pain management - DG Lumbar Spine Complete; Future - diclofenac Sodium (VOLTAREN) 1 % GEL; Apply 4 g topically 4 (four) times daily.  Dispense: 100 g; Refill: 1 - Ambulatory referral to Orthopedics  2. 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  Dispense: 100 g; Refill: 1 - Ambulatory referral to Orthopedics  3. Erectile dysfunction, unspecified erectile dysfunction type Stable - sildenafil (VIAGRA) 100 MG tablet; Take 1 tablet (100 mg total) by mouth daily as needed for erectile dysfunction.  Dispense: 10 tablet; Refill: 1  4. Screening for prostate cancer - PSA, total and free  5. Chronic abdominal pain Uncontrolled Scheduled for EGD with GI He also needs a colonoscopy which will be addressed with GI  6. Type 2 diabetes mellitus with other specified complication, without long-term current use of insulin (HCC) Uncontrolled with A1c of 9.7, goal is less than 7.0 Diabetes  is managed by transplant clinic He has a referral to [facility] endocrinologist  7. Renal transplant recipient Currently on immunosuppressants Renal function is being monitored by the transplant team  8.  Hypertension and chronic kidney disease due to diabetes mellitus Controlled Continue antihypertensives as per transplant clinic Counseled on blood pressure goal of less than 130/80, low-sodium, DASH diet, medication compliance, 150 minutes of moderate intensity exercise per week. Discussed medication compliance, adverse effects.   Meds ordered this encounter  Medications   ammonium lactate (AMLACTIN DAILY) 12 % lotion    Sig: Apply 1 Application topically as needed for dry skin.    Dispense:  400 g    Refill:  1   diclofenac Sodium (VOLTAREN) 1 % GEL    Sig: Apply 4 g topically 4 (four) times daily.    Dispense:  100 g    Refill:  1   sildenafil (VIAGRA) 100 MG tablet    Sig: Take 1 tablet (100 mg total) by mouth daily as needed for erectile dysfunction.    Dispense:  10 tablet    Refill:  1    Ar least 24 hours between doses    Follow-up: Return in about 6 months (around 08/26/2023).       Hoy Register, MD, FAAFP. [facility] [place], [place] [phone]   02/23/2023, 11:33 AM

## 2023-02-24 LAB — PSA, TOTAL AND FREE
PSA, Free Pct: 36 %
PSA, Free: 0.54 ng/mL
Prostate Specific Ag, Serum: 1.5 ng/mL (ref 0.0–4.0)

## 2023-02-28 ENCOUNTER — Telehealth: Payer: Self-pay | Admitting: Sports Medicine

## 2023-02-28 NOTE — Telephone Encounter (Signed)
Mailed reminder letter to patient today 

## 2023-03-13 ENCOUNTER — Other Ambulatory Visit (INDEPENDENT_AMBULATORY_CARE_PROVIDER_SITE_OTHER): Payer: 59

## 2023-03-13 ENCOUNTER — Ambulatory Visit (INDEPENDENT_AMBULATORY_CARE_PROVIDER_SITE_OTHER): Payer: 59 | Admitting: Sports Medicine

## 2023-03-13 DIAGNOSIS — Z1881 Retained glass fragments: Secondary | ICD-10-CM | POA: Diagnosis not present

## 2023-03-13 DIAGNOSIS — M222X2 Patellofemoral disorders, left knee: Secondary | ICD-10-CM

## 2023-03-13 DIAGNOSIS — M222X1 Patellofemoral disorders, right knee: Secondary | ICD-10-CM | POA: Diagnosis not present

## 2023-03-13 DIAGNOSIS — M545 Low back pain, unspecified: Secondary | ICD-10-CM | POA: Diagnosis not present

## 2023-03-13 DIAGNOSIS — E1139 Type 2 diabetes mellitus with other diabetic ophthalmic complication: Secondary | ICD-10-CM

## 2023-03-13 DIAGNOSIS — G8929 Other chronic pain: Secondary | ICD-10-CM

## 2023-03-13 DIAGNOSIS — M25562 Pain in left knee: Secondary | ICD-10-CM

## 2023-03-13 DIAGNOSIS — M25561 Pain in right knee: Secondary | ICD-10-CM | POA: Diagnosis not present

## 2023-03-13 DIAGNOSIS — M79642 Pain in left hand: Secondary | ICD-10-CM | POA: Diagnosis not present

## 2023-03-13 DIAGNOSIS — T148XXA Other injury of unspecified body region, initial encounter: Secondary | ICD-10-CM | POA: Diagnosis not present

## 2023-03-13 DIAGNOSIS — Z940 Kidney transplant status: Secondary | ICD-10-CM

## 2023-03-13 NOTE — Progress Notes (Unsigned)
Low back pain Years of pain Denies radicular symptoms Does have diabetes/ is a kidney transplant Denies OTC medication In pain management Has had injections in the back   Bilateral knee pain Minimal swelling No surgery to knees , no injections  Tried PT but did not help

## 2023-03-13 NOTE — Progress Notes (Unsigned)
Edward Baltimore Sr. - 52 y.o. male MRN [id number]  Date of birth: Sep 06, 1971  Office Visit Note: Visit Date: 03/13/2023 PCP: Hoy Register, MD Referred by: Hoy Register, MD  Subjective: Chief Complaint  Patient presents with   Lower Back - Pain   Left Knee - Pain   Right Knee - Pain   HPI: Edward LEASE Sr. is a pleasant 51 y.o. male who presents today for chronic bilateral knee pain and low back pain.  He is a type-II diabetic. Managed on Januvia 25mg , Glipizide 5mg . Last A1c was 9.7 on 5/16//24.  Pertinent ROS were reviewed with the patient and found to be negative unless otherwise specified above in HPI.   Assessment & Plan: Visit Diagnoses:  1. Chronic midline low back pain without sciatica   2. Chronic pain of left knee   3. Chronic pain of right knee    Plan: ***  Follow-up: No follow-ups on file.   Meds & Orders: No orders of the defined types were placed in this encounter.   Orders Placed This Encounter  Procedures   XR Lumbar Spine 2-3 Views   XR Knee Complete 4 Views Left   XR Knee Complete 4 Views Right     Procedures: No procedures performed      - glass removal from left thumb (put code in)  Clinical History: No specialty comments available.  He reports that he quit smoking about 22 years ago. His smoking use included cigarettes. He has a 2.25 pack-year smoking history. He has never used smokeless tobacco. No results for input(s): "HGBA1C", "LABURIC" in the last 8760 hours.  Objective:   Vital Signs: There were no vitals taken for this visit.  Physical Exam  Gen: Well-appearing, in no acute distress; non-toxic CV: Regular Rate. Well-perfused. Warm.  Resp: Breathing unlabored on room air; no wheezing. Psych: Fluid speech in conversation; appropriate affect; normal thought process Neuro: Sensation intact throughout. No gross coordination deficits.   Ortho Exam - ***  Imaging: No results found.  Past  Medical/Family/Surgical/Social History: Medications & Allergies reviewed per EMR, new medications updated. 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   Mitral regurgitation, Moderate 09/05/2018   ED (erectile dysfunction) 08/10/2017   Chronic kidney disease, stage 5 (HCC) 07/18/2017   Graves disease 03/02/2016   Non compliance with medical treatment 11/26/2015   Uncontrolled hypertension 11/26/2015   Acute on chronic renal failure (HCC) 11/25/2015   DM (diabetes mellitus), 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   Past Medical History:  Diagnosis Date   Acute on chronic renal failure (HCC) 11/25/2015   Anemia    Arthritis    Blind    Left eye   Cataract    CHF (congestive heart failure) (HCC)    Chronic kidney disease    stage 5   COVID-19    Depression    situatuional   Diabetes mellitus    Type II   GERD (gastroesophageal reflux disease)    Graves disease 2014   Headache  in past   Hx of adenomatous and sessile serrated colonic polyps 11/21/2019   Hyperlipidemia    Hypertension    Hypothyroidism    Legally blind    right eye   Neuropathy    Non-compliance    Proteinuria 05/24/2020   Shortness of breath dyspnea    "Better since I've been taking my medications"   Family History  Problem Relation Age of Onset   Hypertension Mother    Diabetes Mother    Bladder Cancer Brother    Kidney disease Other    Colon cancer Neg Hx    Rectal cancer Neg Hx    Esophageal cancer  Neg Hx    Stomach cancer Neg Hx    Past Surgical History:  Procedure Laterality Date   AV FISTULA PLACEMENT Right 04/22/2019   Procedure: RIGHT ARM ARTERIOVENOUS (AV) FISTULA CREATION;  Surgeon: Chuck Hint, MD;  Location: [facility] OR;  Service: Vascular;  Laterality: Right;   CIRCUMCISION     as a child, around 47 years of age   COLONOSCOPY     EYE SURGERY Bilateral    "several "   PARS PLANA VITRECTOMY Right 03/25/2016   Procedure: PARS PLANA VITRECTOMY 25 GAUGE FOR ENDOPHTHALMITIS;  Surgeon: Carmela Rima, MD;  Location: [facility] OR;  Service: Ophthalmology;  Laterality: Right;   WISDOM TOOTH EXTRACTION     Social History   Occupational History   Occupation: disabled  Tobacco Use   Smoking status: Former    Packs/day: 0.75    Years: 3.00    Additional pack years: 0.00    Total pack years: 2.25    Types: Cigarettes    Quit date: 05/28/2000    Years since quitting: 22.8   Smokeless tobacco: Never  Vaping Use   Vaping Use: Never used  Substance and Sexual Activity   Alcohol use: Not Currently    Alcohol/week: 0.0 standard drinks of alcohol    Comment: 1 beer  ocassional   Drug use: Yes    Types: Marijuana    Comment: daily for pain   Sexual activity: Not on file

## 2023-03-14 ENCOUNTER — Encounter: Payer: Self-pay | Admitting: Sports Medicine

## 2023-03-14 DIAGNOSIS — M792 Neuralgia and neuritis, unspecified: Secondary | ICD-10-CM | POA: Diagnosis not present

## 2023-03-15 ENCOUNTER — Encounter: Payer: Self-pay | Admitting: *Deleted

## 2023-03-15 NOTE — Progress Notes (Signed)
[facility] Quality Team Note  Name: Edward KENLEY Sr. Date of Birth: 1971/03/15 MRN: [id number] Date: 03/15/2023  [facility] Quality Team has reviewed this patient's chart, please see recommendations below:  [facility] Quality Other; Pt has open gap for A1C.  Last A1C 9.7 on 02/16/23-out of range.  Pt has ov 08/29/2023.  Would provider be able to order another one in the future or at next ov?

## 2023-03-16 ENCOUNTER — Ambulatory Visit: Payer: 59 | Admitting: Rehabilitative and Restorative Service Providers"

## 2023-03-21 ENCOUNTER — Ambulatory Visit: Payer: Self-pay

## 2023-03-21 ENCOUNTER — Other Ambulatory Visit: Payer: Self-pay | Admitting: Pharmacist

## 2023-03-21 DIAGNOSIS — E1169 Type 2 diabetes mellitus with other specified complication: Secondary | ICD-10-CM

## 2023-03-21 MED ORDER — ATORVASTATIN CALCIUM 40 MG PO TABS: 40.0000 mg | ORAL_TABLET | Freq: Every day | ORAL | 1 refills | Status: DC

## 2023-03-21 NOTE — Patient Outreach (Signed)
Care Coordination   Follow Up Visit Note   03/21/2023 Name: Edward GIOVINO Sr. MRN: [id number] DOB: 1970/10/22  Edward Baltimore Sr. is a 52 y.o. year old male who sees Hoy Register, MD for primary care. I spoke with  Edward Baltimore Sr. by phone today.  What matters to the patients health and wellness today?  Patient would like to start outpatient PT to help manage his back, ankle and knee pain. He will resume his Atorvastatin as directed by PCP. Patient will call the GI doctor to reschedule his EGD.     Goals Addressed             This Visit's Progress    To get a second opinion for cause of stomach pain       Care Coordination Interventions: Evaluation of current treatment plan related to persistent abdominal pain and patient's adherence to plan as established by provider Determined patient completed a f/u with [facility] MD, Dr. Donnal [name], he was scheduled for a EGD but cancelled due to patient was not aware he needed someone to be present during the procedure Provided patient with the contact number for this provider and encouraged him to reschedule the endoscopy when possible, he verbalizes understanding and is agreeable Determined patient will ask a family member or friend to remain present during the procedure as required GI lab      To resume statin therapy       Care Coordination Interventions: Evaluation of current treatment plan related to Aortic Atherosclerosis and patient's adherence to plan as established by provider Reviewed medications with patient and discussed importance of medication adherence Collaborated with Edward [name] Pharm D requesting a new Rx be sent to patient's pharmacy for Atorvastatin 40 mg daily for treatment of Atorvastatin due to current Rx on file has expired Notified patient of new Rx, he will resume taking as directed once refill is complete  Lipid Panel     Component Value Date/Time   CHOL 137  10/21/2019 0854   TRIG 116 10/21/2019 0854   HDL 55 10/21/2019 0854   CHOLHDL 2.5 10/21/2019 0854   CHOLHDL 2.8 09/19/2016 1023   VLDL 37 (H) 09/19/2016 1023   LDLCALC 61 10/21/2019 0854   LABVLDL 21 10/21/2019 0854        To work with PT for low back pain, ankle pain and knee pain       Care Coordination Interventions: Reviewed provider established plan for pain management Discussed importance of adherence to all scheduled medical appointments Counseled on the importance of reporting any/all new or changed pain symptoms or management strategies to pain management provider Advised patient to report to care team affect of pain on daily activities Reviewed with patient prescribed pharmacological and nonpharmacological pain relief strategies Reviewed with patient his upcoming scheduled outpatient PT visit scheduled for 03/27/23 @09 :30 AM    Interventions Today    Flowsheet Row Most Recent Value  Chronic Disease   Chronic disease during today's visit Other, Diabetes  [chronic back pain,  bilateral knee pain,  GI pain,  Atheroslerosis]  General Interventions   General Interventions Discussed/Reviewed General Interventions Discussed, General Interventions Reviewed, Doctor Visits, Communication with  Doctor Visits Discussed/Reviewed Doctor Visits Discussed, Doctor Visits Reviewed, Specialist, PCP  Communication with Pharmacists  [Edward [name] Pharm D]  Exercise Interventions   Exercise Discussed/Reviewed Exercise Discussed, Exercise Reviewed, Physical Activity  Physical Activity Discussed/Reviewed Physical Activity Reviewed, Physical Activity Discussed, Home Exercise Program (HEP)  Education Interventions   Education Provided Provided Education  Provided Verbal Education On Medication, Exercise, When to see the doctor  Pharmacy Interventions   Pharmacy Dicussed/Reviewed Pharmacy Topics Discussed, Pharmacy Topics Reviewed, Medications and their functions          SDOH  assessments and interventions completed:  No     Care Coordination Interventions:  Yes, provided   Follow up plan: Follow up call scheduled for 04/18/23 @12  PM    Encounter Outcome:  Pt. Visit Completed

## 2023-03-21 NOTE — Patient Instructions (Signed)
Visit Information  Thank you for taking time to visit with me today. Please don't hesitate to contact me if I can be of assistance to you.   Following are the goals we discussed today:   Goals Addressed             This Visit's Progress    To get a second opinion for cause of stomach pain       Care Coordination Interventions: Evaluation of current treatment plan related to persistent abdominal pain and patient's adherence to plan as established by provider Determined patient completed a f/u with [facility] GI MD, Dr. Donnal Debar, he was scheduled for a EGD but cancelled due to patient was not aware he needed someone to be present during the procedure Provided patient with the contact number for this provider and encouraged him to reschedule the endoscopy when possible, he verbalizes understanding and is agreeable Determined patient will ask a family member or friend to remain present during the procedure as required GI lab      To resume statin therapy       Care Coordination Interventions: Evaluation of current treatment plan related to Aortic Atherosclerosis and patient's adherence to plan as established by provider Reviewed medications with patient and discussed importance of medication adherence Collaborated with Butch Penny Pharm D requesting a new Rx be sent to patient's pharmacy for Atorvastatin 40 mg daily for treatment of Atorvastatin due to current Rx on file has expired Notified patient of new Rx, he will resume taking as directed once refill is complete  Lipid Panel     Component Value Date/Time   CHOL 137 10/21/2019 0854   TRIG 116 10/21/2019 0854   HDL 55 10/21/2019 0854   CHOLHDL 2.5 10/21/2019 0854   CHOLHDL 2.8 09/19/2016 1023   VLDL 37 (H) 09/19/2016 1023   LDLCALC 61 10/21/2019 0854   LABVLDL 21 10/21/2019 0854        To work with PT for low back pain, ankle pain and knee pain       Care Coordination Interventions: Reviewed provider  established plan for pain management Discussed importance of adherence to all scheduled medical appointments Counseled on the importance of reporting any/all new or changed pain symptoms or management strategies to pain management provider Advised patient to report to care team affect of pain on daily activities Reviewed with patient prescribed pharmacological and nonpharmacological pain relief strategies Reviewed with patient his upcoming scheduled outpatient PT visit scheduled for 03/27/23 @09 :30 AM           Our next appointment is by telephone on 04/18/23 at 12 PM  Please call the care guide team at [phone] if you need to cancel or reschedule your appointment.   If you are experiencing a Mental Health or Behavioral Health Crisis or need someone to talk to, please call 1-800-273-TALK (toll free, 24 hour hotline)  Patient verbalizes understanding of instructions and care plan provided today and agrees to view in MyChart. Active MyChart status and patient understanding of how to access instructions and care plan via MyChart confirmed with patient.     Delsa Sale, RN, BSN, CCM Care Management Coordinator [facility] Care Management  Direct Phone: [phone]

## 2023-03-27 ENCOUNTER — Ambulatory Visit: Payer: 59 | Admitting: Physical Therapy

## 2023-04-18 ENCOUNTER — Ambulatory Visit: Payer: Self-pay

## 2023-04-18 ENCOUNTER — Other Ambulatory Visit: Payer: Self-pay | Admitting: Pharmacist

## 2023-04-18 DIAGNOSIS — E113513 Type 2 diabetes mellitus with proliferative diabetic retinopathy with macular edema, bilateral: Secondary | ICD-10-CM | POA: Diagnosis not present

## 2023-04-18 DIAGNOSIS — E1139 Type 2 diabetes mellitus with other diabetic ophthalmic complication: Secondary | ICD-10-CM

## 2023-04-18 DIAGNOSIS — H35371 Puckering of macula, right eye: Secondary | ICD-10-CM | POA: Diagnosis not present

## 2023-04-18 DIAGNOSIS — T85398A Other mechanical complication of other ocular prosthetic devices, implants and grafts, initial encounter: Secondary | ICD-10-CM | POA: Diagnosis not present

## 2023-04-18 MED ORDER — ACCU-CHEK GUIDE W/DEVICE KIT: PACK | 0 refills | Status: AC

## 2023-04-18 MED ORDER — ACCU-CHEK SOFTCLIX LANCETS MISC
2 refills | Status: AC
Start: 2023-04-18 — End: ?

## 2023-04-18 MED ORDER — ACCU-CHEK GUIDE VI STRP: ORAL_STRIP | 2 refills | Status: AC

## 2023-04-18 NOTE — Patient Instructions (Signed)
Visit Information  Thank you for taking time to visit with me today. Please don't hesitate to contact me if I can be of assistance to you.   Following are the goals we discussed today:   Goals Addressed             This Visit's Progress    To get a second opinion for cause of stomach pain       Care Coordination Interventions: Evaluation of current treatment plan related to persistent abdominal pain and patient's adherence to plan as established by provider Determined patient has yet to hear from [facility] lab to reschedule his EGD Provided patient with the contact number for this provider and encouraged him to reschedule this procedure when ready       To lower A1c <7.0%       Care Coordination Interventions: Provided education to patient about basic DM disease process Reviewed medications with patient and discussed importance of medication adherence Counseled on importance of regular laboratory monitoring as prescribed Provided patient with written educational materials related to hypo and hyperglycemia and importance of correct treatment Advised patient, providing education and rationale, to check cbg daily before meals and record, calling PCP for findings outside established parameters Referral made to pharmacy team for assistance with Rx for new glucometer Allie Dimmer D will send in Rx for replacement glucometer) Review of patient status, including review of consultants reports, relevant laboratory and other test results, and medications completed Counseled on Diabetic diet, my plate method, 147 minutes of moderate intensity exercise/week Mailed printed educational materials related to Diabetes Management  Assessed patient's understanding of A1c goal: <7% Lab Results  Component Value Date   HGBA1C 9.7 02/16/2023           To resume statin therapy       Care Coordination Interventions: Evaluation of current treatment plan related to Aortic Atherosclerosis and  patient's adherence to plan as established by provider Reviewed medications with patient and confirmed he filled his Atorvastatin and has resumed taking this medication as directed, educated on the importance of medication adherence Lipid Panel     Component Value Date/Time   CHOL 137 10/21/2019 0854   TRIG 116 10/21/2019 0854   HDL 55 10/21/2019 0854   CHOLHDL 2.5 10/21/2019 0854   CHOLHDL 2.8 09/19/2016 1023   VLDL 37 (H) 09/19/2016 1023   LDLCALC 61 10/21/2019 0854   LABVLDL 21 10/21/2019 0854        To work with PT for low back pain, ankle pain and knee pain       Care Coordination Interventions: Reviewed provider established plan for pain management Determined patient cancelled his PT visit, he will call to reschedule when ready  Discussed importance of adherence to all scheduled medical appointments        Our next appointment is by telephone on 05/16/23 at 12:30 PM  Please call the care guide team at [phone] if you need to cancel or reschedule your appointment.   If you are experiencing a Mental Health or Behavioral Health Crisis or need someone to talk to, please call 1-800-273-TALK (toll free, 24 hour hotline)  Patient verbalizes understanding of instructions and care plan provided today and agrees to view in MyChart. Active MyChart status and patient understanding of how to access instructions and care plan via MyChart confirmed with patient.     Delsa Sale, RN, BSN, CCM Care Management Coordinator [facility] Care Management Direct Phone: [phone]

## 2023-04-18 NOTE — Patient Outreach (Signed)
Care Coordination   Follow Up Visit Note   04/18/2023 Name: Edward CLOS Sr. MRN: [id number] DOB: 1970/12/13  Edward Baltimore Sr. is a 52 y.o. year old male who sees Hoy Register, MD for primary care. I spoke with  Edward Baltimore Sr. by phone today.  What matters to the patients health and wellness today?  Patient will start checking his blood sugars to help better manage his diabetes.     Goals Addressed             This Visit's Progress    To get a second opinion for cause of stomach pain       Care Coordination Interventions: Evaluation of current treatment plan related to persistent abdominal pain and patient's adherence to plan as established by provider Determined patient has yet to hear from [facility] lab to reschedule his EGD Provided patient with the contact number for this provider and encouraged him to reschedule this procedure when ready       To lower A1c <7.0%       Care Coordination Interventions: Provided education to patient about basic DM disease process Reviewed medications with patient and discussed importance of medication adherence Counseled on importance of regular laboratory monitoring as prescribed Provided patient with written educational materials related to hypo and hyperglycemia and importance of correct treatment Advised patient, providing education and rationale, to check cbg daily before meals and record, calling PCP for findings outside established parameters Referral made to pharmacy team for assistance with Rx for new glucometer Allie Dimmer D will send in Rx for replacement glucometer) Review of patient status, including review of consultants reports, relevant laboratory and other test results, and medications completed Counseled on Diabetic diet, my plate method, 132 minutes of moderate intensity exercise/week Mailed printed educational materials related to Diabetes Management  Assessed patient's understanding of A1c  goal: <7% Lab Results  Component Value Date   HGBA1C 9.7 02/16/2023           To resume statin therapy       Care Coordination Interventions: Evaluation of current treatment plan related to Aortic Atherosclerosis and patient's adherence to plan as established by provider Reviewed medications with patient and confirmed he filled his Atorvastatin and has resumed taking this medication as directed, educated on the importance of medication adherence Lipid Panel     Component Value Date/Time   CHOL 137 10/21/2019 0854   TRIG 116 10/21/2019 0854   HDL 55 10/21/2019 0854   CHOLHDL 2.5 10/21/2019 0854   CHOLHDL 2.8 09/19/2016 1023   VLDL 37 (H) 09/19/2016 1023   LDLCALC 61 10/21/2019 0854   LABVLDL 21 10/21/2019 0854        To work with PT for low back pain, ankle pain and knee pain       Care Coordination Interventions: Reviewed provider established plan for pain management Determined patient cancelled his PT visit, he will call to reschedule when ready  Discussed importance of adherence to all scheduled medical appointments    Interventions Today    Flowsheet Row Most Recent Value  Chronic Disease   Chronic disease during today's visit Other, Diabetes  [Hyperlipidemia]  General Interventions   General Interventions Discussed/Reviewed General Interventions Discussed, General Interventions Reviewed, Labs, Doctor Visits, Durable Medical Equipment (DME)  Doctor Visits Discussed/Reviewed Doctor Visits Reviewed, Doctor Visits Discussed, Specialist, PCP  Durable Medical Equipment (DME) Glucomoter  Education Interventions   Education Provided Provided Education, Provided Printed Education  Provided Verbal Education On  Blood Sugar Monitoring, Labs, Nutrition, When to see the doctor, Medication  Labs Reviewed Hgb A1c  Nutrition Interventions   Nutrition Discussed/Reviewed Nutrition Discussed, Nutrition Reviewed, Carbohydrate meal planning, Portion sizes  Pharmacy Interventions    Pharmacy Dicussed/Reviewed Pharmacy Topics Discussed, Pharmacy Topics Reviewed, Medications and their functions          SDOH assessments and interventions completed:  No     Care Coordination Interventions:  Yes, provided   Follow up plan: Referral made to Georgiana Shore Ausdall Pharm D to assist with Rx for replacement glucometer Follow up call scheduled for 05/16/23 @12 :30 PM    Encounter Outcome:  Pt. Visit Completed

## 2023-04-24 ENCOUNTER — Encounter: Payer: Self-pay | Admitting: Podiatry

## 2023-04-24 ENCOUNTER — Ambulatory Visit (INDEPENDENT_AMBULATORY_CARE_PROVIDER_SITE_OTHER): Payer: 59 | Admitting: Podiatry

## 2023-04-24 DIAGNOSIS — L84 Corns and callosities: Secondary | ICD-10-CM | POA: Diagnosis not present

## 2023-04-24 DIAGNOSIS — B351 Tinea unguium: Secondary | ICD-10-CM | POA: Diagnosis not present

## 2023-04-24 DIAGNOSIS — E1142 Type 2 diabetes mellitus with diabetic polyneuropathy: Secondary | ICD-10-CM | POA: Diagnosis not present

## 2023-04-24 DIAGNOSIS — M2010 Hallux valgus (acquired), unspecified foot: Secondary | ICD-10-CM

## 2023-04-24 NOTE — Progress Notes (Signed)
This patient returns to my office for at risk foot care.  This patient requires this care by a professional since this patient will be at risk due to having ESRD, Blind,   and DM with polyneuropathy.  This patient is unable to cut nails himself since the patient cannot reach his nails.These nails are painful walking and wearing shoes.  Patient has kidney transplant. This patient presents for at risk foot care today.  General Appearance  Alert, conversant and in no acute stress.  Vascular  Dorsalis pedis and posterior tibial  pulses are  weakly palpable  bilaterally.  Capillary return is within normal limits  bilaterally. Cold feet. bilaterally.  Neurologic  Senn-Weinstein monofilament wire test diminished  bilaterally. Muscle power within normal limits bilaterally.  Nails Thick disfigured discolored nails with subungual debris  from hallux to fifth toes bilaterally. No evidence of bacterial infection or drainage bilaterally. HAV  B/L.  Orthopedic  No limitations of motion  feet .  No crepitus or effusions noted.  No bony pathology or digital deformities noted.  HAV  B/L.  Numbness, shooting pain into his right big toe.  Skin  normotropic skin with no porokeratosis noted bilaterally.  No signs of infections or ulcers noted.  Callus distal aspect right hallux.   Onychomycosis  Pain in right toes  Pain in left toes  Consent was obtained for treatment procedures.   Mechanical debridement of nails 1-5  bilaterally performed with a nail nipper.  Filed with dremel without incident. To see Dr.  Allena Katz for foot pain including bunions.  Evaluate for his neuropathy and HAV deformity.   Return office visit  3 months                    Told patient to return for periodic foot care and evaluation due to potential at risk complications.   Helane Gunther DPM

## 2023-04-28 ENCOUNTER — Ambulatory Visit (INDEPENDENT_AMBULATORY_CARE_PROVIDER_SITE_OTHER): Payer: 59 | Admitting: Podiatry

## 2023-04-28 DIAGNOSIS — E1142 Type 2 diabetes mellitus with diabetic polyneuropathy: Secondary | ICD-10-CM | POA: Diagnosis not present

## 2023-04-28 DIAGNOSIS — L84 Corns and callosities: Secondary | ICD-10-CM | POA: Diagnosis not present

## 2023-04-28 NOTE — Progress Notes (Signed)
Subjective:  Patient ID: Edward Baltimore Sr., male    DOB: 21-Feb-1971,  MRN: [id number]  Chief Complaint  Patient presents with   Diabetes    Patient came in today for left foot pain, burning sharp pain, in the toes, started a month ago, A1c- 9.7 BG- patient is not taking at this time     52 y.o. male presents with the above complaint.  Patient presents for right hallux distal tip hyperkeratotic lesion/preulcerative callus.  Patient is a diabetic with last A1c of 9.7.  He states he is getting burning sharp pain in the toe started about a month ago.  He went to get it evaluated he has not seen MRIs prior to seeing me.  Hurts with ambulation is with pressure pain scale 7 out of 10 dull achy in nature.   Review of Systems: Negative except as noted in the HPI. Denies N/V/F/Ch.  Past Medical History:  Diagnosis Date   Acute on chronic renal failure (HCC) 11/25/2015   Anemia    Arthritis    Blind    Left eye   Cataract    CHF (congestive heart failure) (HCC)    Chronic kidney disease    stage 5   COVID-19    Depression    situatuional   Diabetes mellitus    Type II   GERD (gastroesophageal reflux disease)    Graves disease 2014   Headache    in past   Hx of adenomatous and sessile serrated colonic polyps 11/21/2019   Hyperlipidemia    Hypertension    Hypothyroidism    Legally blind    right eye   Neuropathy    Non-compliance    Proteinuria 05/24/2020   Shortness of breath dyspnea    "Better since I've been taking my medications"    Current Outpatient Medications:    Accu-Chek Softclix Lancets lancets, Use to check blood sugar once daily. E11.39, Disp: 100 each, Rfl: 2   acetaminophen (TYLENOL) 500 MG tablet, Take 1 tablet (500 mg total) by mouth every 6 (six) hours as needed for moderate pain or mild pain., Disp: 100 tablet, Rfl: 1   Alcohol Swabs (B-D SINGLE USE SWABS REGULAR) PADS, Use as directed., Disp: 100 each, Rfl: 6   ammonium lactate (AMLACTIN DAILY) 12 %  lotion, Apply 1 Application topically as needed for dry skin., Disp: 400 g, Rfl: 1   atorvastatin (LIPITOR) 40 MG tablet, Take 1 tablet (40 mg total) by mouth daily., Disp: 90 tablet, Rfl: 1   Blood Glucose Calibration (MYGLUCOHEALTH CONTROL) SOLN, , Disp: , Rfl:    Blood Glucose Monitoring Suppl (ACCU-CHEK GUIDE) w/Device KIT, Use to check blood sugar once daily. E11.39, Disp: 1 kit, Rfl: 0   carvedilol (COREG) 12.5 MG tablet, Take 12.5 mg by mouth 2 (two) times daily with a meal., Disp: , Rfl:    ciclopirox (PENLAC) 8 % solution, Apply topically at bedtime. Apply over nail and surrounding skin. Apply daily over previous coat. After seven (7) days, may remove with alcohol and continue cycle., Disp: 6.6 mL, Rfl: 0   cinacalcet (SENSIPAR) 90 MG tablet, Take 90 mg by mouth daily., Disp: , Rfl:    diclofenac Sodium (VOLTAREN) 1 % GEL, Apply 4 g topically 4 (four) times daily., Disp: 100 g, Rfl: 1   dicyclomine (BENTYL) 20 MG tablet, Take 1 tablet (20 mg total) by mouth 2 (two) times daily as needed for spasms., Disp: 20 tablet, Rfl: 0   DULoxetine (CYMBALTA) 60 MG capsule,  Take 1 capsule (60 mg total) by mouth daily., Disp: 30 capsule, Rfl: 3   furosemide (LASIX) 80 MG tablet, Take 1 tablet (80 mg total) by mouth 2 (two) times daily. On the days you DO NOT have dialysis, Disp: 34 tablet, Rfl: 2   glipiZIDE (GLUCOTROL XL) 5 MG 24 hr tablet, Take 1 tablet by mouth daily., Disp: , Rfl:    glucose blood (ACCU-CHEK GUIDE) test strip, Use to check blood sugar once daily. E11.39, Disp: 100 each, Rfl: 2   IRON SUCROSE IV, Iron Sucrose (Venofer), Disp: , Rfl:    JANUVIA 25 MG tablet, Take 1 tablet by mouth daily., Disp: , Rfl:    levothyroxine (SYNTHROID) 137 MCG tablet, Take 1 tablet (137 mcg total) by mouth daily before breakfast., Disp: 30 tablet, Rfl: 2   metoCLOPramide (REGLAN) 10 MG tablet, Take 1 tablet (10 mg total) by mouth every 6 (six) hours as needed for nausea (nausea/headache)., Disp: 10 tablet,  Rfl: 0   mycophenolate (MYFORTIC) 180 MG EC tablet, Take 180 mg by mouth 2 (two) times daily., Disp: , Rfl:    ondansetron (ZOFRAN-ODT) 4 MG disintegrating tablet, Take 1 tablet (4 mg total) by mouth every 8 (eight) hours as needed for nausea or vomiting., Disp: 20 tablet, Rfl: 0   oxyCODONE (ROXICODONE) 5 MG immediate release tablet, Take 1 tablet (5 mg total) by mouth every 4 (four) hours as needed for severe pain., Disp: 12 tablet, Rfl: 0   pantoprazole (PROTONIX) 40 MG tablet, Take 1 tablet (40 mg total) by mouth daily., Disp: 30 tablet, Rfl: 2   polyethylene glycol powder (GLYCOLAX/MIRALAX) 17 GM/SCOOP powder, Take by mouth., Disp: , Rfl:    potassium chloride SA (KLOR-CON M) 20 MEQ tablet, Take 2 tablets (40 mEq total) by mouth daily for 3 days., Disp: 6 tablet, Rfl: 0   pregabalin (LYRICA) 25 MG capsule, Take 25 mg (1 capsule) in the morning and afternoon and 50 mg (2 capsules) at bedtime for 10 days. Then increase to 50 mg (2 capsules) in the morning, 25 mg (1 capsule) in the afternoon, and 50 mg (2 capsules) at bedtime for 10 days. Then increase to 50 mg (2 capsules) three times daily after that., Disp: , Rfl:    promethazine (PHENERGAN) 25 MG suppository, Place 1 suppository (25 mg total) rectally every 6 (six) hours as needed for nausea or vomiting., Disp: 12 each, Rfl: 0   promethazine (PHENERGAN) 25 MG tablet, Take 1 tablet (25 mg total) by mouth every 6 (six) hours as needed for nausea or vomiting., Disp: 30 tablet, Rfl: 0   senna-docusate (SENOKOT-S) 8.6-50 MG tablet, Take by mouth., Disp: , Rfl:    SENSIPAR 30 MG tablet, Take 30 mg by mouth daily., Disp: , Rfl:    sevelamer carbonate (RENVELA) 800 MG tablet, Take 1 tablet (800 mg total) by mouth 3 (three) times daily with meals., Disp: 90 tablet, Rfl: 0   sildenafil (VIAGRA) 100 MG tablet, Take 1 tablet (100 mg total) by mouth daily as needed for erectile dysfunction., Disp: 10 tablet, Rfl: 1   sulfamethoxazole-trimethoprim (BACTRIM)  400-80 MG tablet, Take 1 tablet by mouth 3 (three) times a week., Disp: , Rfl:    tacrolimus (PROGRAF) 1 MG capsule, Take 1 mg by mouth 2 (two) times daily., Disp: , Rfl:    VELPHORO 500 MG chewable tablet, Chew 1,000 mg by mouth 3 (three) times daily., Disp: , Rfl:   Social History   Tobacco Use  Smoking Status Former  Current packs/day: 0.00   Average packs/day: 0.8 packs/day for 3.0 years (2.3 ttl pk-yrs)   Types: Cigarettes   Start date: 05/28/1997   Quit date: 05/28/2000   Years since quitting: 22.9  Smokeless Tobacco Never    No Known Allergies Objective:  There were no vitals filed for this visit. There is no height or weight on file to calculate BMI. Constitutional Well developed. Well nourished.  Vascular Dorsalis pedis pulses palpable bilaterally. Posterior tibial pulses palpable bilaterally. Capillary refill normal to all digits.  No cyanosis or clubbing noted. Pedal hair growth normal.  Neurologic Normal speech. Oriented to person, place, and time. Epicritic sensation to light touch grossly present bilaterally.  Dermatologic Right hallux distal tip hyperkeratotic lesion/preulcerative callus.  Mild pain on palpation.  No open wounds or lesion noted.  Orthopedic: Normal joint ROM without pain or crepitus bilaterally. No visible deformities. No bony tenderness.   Radiographs: None Assessment:   1. Type 2 diabetes mellitus with polyneuropathy (HCC)   2. Pre-ulcerative calluses    Plan:  Patient was evaluated and treated and all questions answered.  Right hallux distal tip preulcerative callus without ulcer -All questions and concerns were discussed with the patient extensive detail -Using chisel blade handle the lesion was debrided down to healthy striated tissue.  No complication and no pinpoint bleeding noted.  No open wounds or lesion noted -I discussed shoe gear modification and glucose control he states understanding.  No follow-ups on file.

## 2023-05-02 DIAGNOSIS — M792 Neuralgia and neuritis, unspecified: Secondary | ICD-10-CM | POA: Diagnosis not present

## 2023-05-09 DIAGNOSIS — N189 Chronic kidney disease, unspecified: Secondary | ICD-10-CM | POA: Diagnosis not present

## 2023-05-09 DIAGNOSIS — E872 Acidosis, unspecified: Secondary | ICD-10-CM | POA: Diagnosis not present

## 2023-05-09 DIAGNOSIS — N1831 Chronic kidney disease, stage 3a: Secondary | ICD-10-CM | POA: Diagnosis not present

## 2023-05-09 DIAGNOSIS — D631 Anemia in chronic kidney disease: Secondary | ICD-10-CM | POA: Diagnosis not present

## 2023-05-09 DIAGNOSIS — Z79899 Other long term (current) drug therapy: Secondary | ICD-10-CM | POA: Diagnosis not present

## 2023-05-09 DIAGNOSIS — Z940 Kidney transplant status: Secondary | ICD-10-CM | POA: Diagnosis not present

## 2023-05-09 DIAGNOSIS — N2581 Secondary hyperparathyroidism of renal origin: Secondary | ICD-10-CM | POA: Diagnosis not present

## 2023-05-09 DIAGNOSIS — I770 Arteriovenous fistula, acquired: Secondary | ICD-10-CM | POA: Diagnosis not present

## 2023-05-09 DIAGNOSIS — I129 Hypertensive chronic kidney disease with stage 1 through stage 4 chronic kidney disease, or unspecified chronic kidney disease: Secondary | ICD-10-CM | POA: Diagnosis not present

## 2023-05-12 ENCOUNTER — Ambulatory Visit: Payer: 59 | Admitting: Sports Medicine

## 2023-05-16 ENCOUNTER — Ambulatory Visit: Payer: Self-pay

## 2023-05-16 NOTE — Patient Outreach (Signed)
  Care Coordination   05/16/2023 Name: Edward WOOLFORD Sr. MRN: [id number] DOB: 12/05/1970   Care Coordination Outreach Attempts:  An unsuccessful telephone outreach was attempted for a scheduled appointment today.  Follow Up Plan:  Additional outreach attempts will be made to offer the patient care coordination information and services.   Encounter Outcome:  No Answer   Care Coordination Interventions:  No, not indicated    Delsa Sale, RN, BSN, CCM Care Management Coordinator [facility] Care Management  Direct Phone: [phone]

## 2023-05-30 DIAGNOSIS — M792 Neuralgia and neuritis, unspecified: Secondary | ICD-10-CM | POA: Diagnosis not present

## 2023-05-31 ENCOUNTER — Telehealth: Payer: Self-pay | Admitting: *Deleted

## 2023-05-31 NOTE — Progress Notes (Signed)
  Care Coordination Note  05/31/2023 Name: [name] MRN: [id number] DOB: 05-04-1971  Edward Baltimore Sr. is a 52 y.o. year old male who is a primary care patient of Hoy Register, MD and is actively engaged with the care management team. I reached out to Edward Baltimore Sr. by phone today to assist with re-scheduling a follow up visit with the RN Case Manager  Follow up plan: Unsuccessful telephone outreach attempt made. A HIPAA compliant phone message was left for the patient providing contact information and requesting a return call.   [facility]  Care Coordination Care Guide  Direct Dial: [phone]

## 2023-06-07 ENCOUNTER — Encounter (HOSPITAL_COMMUNITY): Payer: Self-pay

## 2023-06-07 NOTE — Progress Notes (Signed)
  Care Coordination Note  06/07/2023 Name: Edward KOSMALSKI Sr. MRN: [id number] DOB: [birth year]  Edward Baltimore Sr. is a 52 y.o. year old male who is a primary care patient of Hoy Register, MD and is actively engaged with the care management team. I reached out to Edward Baltimore Sr. by phone today to assist with re-scheduling a follow up visit with the RN Case Manager  Follow up plan: Telephone appointment with care management team member scheduled for:06/19/23  [facility] Coordination Care Guide  Direct Dial: [phone]

## 2023-06-13 ENCOUNTER — Encounter (HOSPITAL_COMMUNITY): Payer: Self-pay

## 2023-06-14 ENCOUNTER — Ambulatory Visit (INDEPENDENT_AMBULATORY_CARE_PROVIDER_SITE_OTHER): Payer: 59 | Admitting: Podiatry

## 2023-06-14 DIAGNOSIS — L84 Corns and callosities: Secondary | ICD-10-CM | POA: Diagnosis not present

## 2023-06-14 DIAGNOSIS — E1142 Type 2 diabetes mellitus with diabetic polyneuropathy: Secondary | ICD-10-CM

## 2023-06-14 NOTE — Progress Notes (Signed)
Subjective:  Patient ID: Edward Baltimore Sr., male    DOB: 03/26/1971,  MRN: [id number]  Chief Complaint  Patient presents with   Toe Pain    Sore place on top of right     52 y.o. male presents with the above complaint.  Patient presents with right hallux ulceration/callus.  He wants to get eval make sure there is no or so sore.  He denies any other acute complaints.  Pain scale is 2 out of 10 dull achy in nature.  No ulceration noted to bilateral hallux primarily callus   Review of Systems: Negative except as noted in the HPI. Denies N/V/F/Ch.  Past Medical History:  Diagnosis Date   Acute on chronic renal failure (HCC) 11/25/2015   Anemia    Arthritis    Blind    Left eye   Cataract    CHF (congestive heart failure) (HCC)    Chronic kidney disease    stage 5   COVID-19    Depression    situatuional   Diabetes mellitus    Type II   GERD (gastroesophageal reflux disease)    Graves disease 2014   Headache    in past   Hx of adenomatous and sessile serrated colonic polyps 11/21/2019   Hyperlipidemia    Hypertension    Hypothyroidism    Legally blind    right eye   Neuropathy    Non-compliance    Proteinuria 05/24/2020   Shortness of breath dyspnea    "Better since I've been taking my medications"    Current Outpatient Medications:    Accu-Chek Softclix Lancets lancets, Use to check blood sugar once daily. E11.39, Disp: 100 each, Rfl: 2   acetaminophen (TYLENOL) 500 MG tablet, Take 1 tablet (500 mg total) by mouth every 6 (six) hours as needed for moderate pain or mild pain., Disp: 100 tablet, Rfl: 1   Alcohol Swabs (B-D SINGLE USE SWABS REGULAR) PADS, Use as directed., Disp: 100 each, Rfl: 6   ammonium lactate (AMLACTIN DAILY) 12 % lotion, Apply 1 Application topically as needed for dry skin., Disp: 400 g, Rfl: 1   atorvastatin (LIPITOR) 40 MG tablet, Take 1 tablet (40 mg total) by mouth daily., Disp: 90 tablet, Rfl: 1   Blood Glucose Calibration (MYGLUCOHEALTH  CONTROL) SOLN, , Disp: , Rfl:    Blood Glucose Monitoring Suppl (ACCU-CHEK GUIDE) w/Device KIT, Use to check blood sugar once daily. E11.39, Disp: 1 kit, Rfl: 0   carvedilol (COREG) 12.5 MG tablet, Take 12.5 mg by mouth 2 (two) times daily with a meal., Disp: , Rfl:    ciclopirox (PENLAC) 8 % solution, Apply topically at bedtime. Apply over nail and surrounding skin. Apply daily over previous coat. After seven (7) days, may remove with alcohol and continue cycle., Disp: 6.6 mL, Rfl: 0   cinacalcet (SENSIPAR) 90 MG tablet, Take 90 mg by mouth daily., Disp: , Rfl:    diclofenac Sodium (VOLTAREN) 1 % GEL, Apply 4 g topically 4 (four) times daily., Disp: 100 g, Rfl: 1   dicyclomine (BENTYL) 20 MG tablet, Take 1 tablet (20 mg total) by mouth 2 (two) times daily as needed for spasms., Disp: 20 tablet, Rfl: 0   DULoxetine (CYMBALTA) 60 MG capsule, Take 1 capsule (60 mg total) by mouth daily., Disp: 30 capsule, Rfl: 3   furosemide (LASIX) 80 MG tablet, Take 1 tablet (80 mg total) by mouth 2 (two) times daily. On the days you DO NOT have dialysis, Disp: 34  tablet, Rfl: 2   glipiZIDE (GLUCOTROL XL) 5 MG 24 hr tablet, Take 1 tablet by mouth daily., Disp: , Rfl:    glucose blood (ACCU-CHEK GUIDE) test strip, Use to check blood sugar once daily. E11.39, Disp: 100 each, Rfl: 2   IRON SUCROSE IV, Iron Sucrose (Venofer), Disp: , Rfl:    JANUVIA 25 MG tablet, Take 1 tablet by mouth daily., Disp: , Rfl:    levothyroxine (SYNTHROID) 137 MCG tablet, Take 1 tablet (137 mcg total) by mouth daily before breakfast., Disp: 30 tablet, Rfl: 2   metoCLOPramide (REGLAN) 10 MG tablet, Take 1 tablet (10 mg total) by mouth every 6 (six) hours as needed for nausea (nausea/headache)., Disp: 10 tablet, Rfl: 0   mycophenolate (MYFORTIC) 180 MG EC tablet, Take 180 mg by mouth 2 (two) times daily., Disp: , Rfl:    ondansetron (ZOFRAN-ODT) 4 MG disintegrating tablet, Take 1 tablet (4 mg total) by mouth every 8 (eight) hours as needed for  nausea or vomiting., Disp: 20 tablet, Rfl: 0   oxyCODONE (ROXICODONE) 5 MG immediate release tablet, Take 1 tablet (5 mg total) by mouth every 4 (four) hours as needed for severe pain., Disp: 12 tablet, Rfl: 0   pantoprazole (PROTONIX) 40 MG tablet, Take 1 tablet (40 mg total) by mouth daily., Disp: 30 tablet, Rfl: 2   polyethylene glycol powder (GLYCOLAX/MIRALAX) 17 GM/SCOOP powder, Take by mouth., Disp: , Rfl:    potassium chloride SA (KLOR-CON M) 20 MEQ tablet, Take 2 tablets (40 mEq total) by mouth daily for 3 days., Disp: 6 tablet, Rfl: 0   pregabalin (LYRICA) 25 MG capsule, Take 25 mg (1 capsule) in the morning and afternoon and 50 mg (2 capsules) at bedtime for 10 days. Then increase to 50 mg (2 capsules) in the morning, 25 mg (1 capsule) in the afternoon, and 50 mg (2 capsules) at bedtime for 10 days. Then increase to 50 mg (2 capsules) three times daily after that., Disp: , Rfl:    promethazine (PHENERGAN) 25 MG suppository, Place 1 suppository (25 mg total) rectally every 6 (six) hours as needed for nausea or vomiting., Disp: 12 each, Rfl: 0   promethazine (PHENERGAN) 25 MG tablet, Take 1 tablet (25 mg total) by mouth every 6 (six) hours as needed for nausea or vomiting., Disp: 30 tablet, Rfl: 0   senna-docusate (SENOKOT-S) 8.6-50 MG tablet, Take by mouth., Disp: , Rfl:    SENSIPAR 30 MG tablet, Take 30 mg by mouth daily., Disp: , Rfl:    sevelamer carbonate (RENVELA) 800 MG tablet, Take 1 tablet (800 mg total) by mouth 3 (three) times daily with meals., Disp: 90 tablet, Rfl: 0   sildenafil (VIAGRA) 100 MG tablet, Take 1 tablet (100 mg total) by mouth daily as needed for erectile dysfunction., Disp: 10 tablet, Rfl: 1   sulfamethoxazole-trimethoprim (BACTRIM) 400-80 MG tablet, Take 1 tablet by mouth 3 (three) times a week., Disp: , Rfl:    tacrolimus (PROGRAF) 1 MG capsule, Take 1 mg by mouth 2 (two) times daily., Disp: , Rfl:    VELPHORO 500 MG chewable tablet, Chew 1,000 mg by mouth 3  (three) times daily., Disp: , Rfl:   Social History   Tobacco Use  Smoking Status Former   Current packs/day: 0.00   Average packs/day: 0.8 packs/day for 3.0 years (2.3 ttl pk-yrs)   Types: Cigarettes   Start date: 05/28/1997   Quit date: 05/28/2000   Years since quitting: 23.0  Smokeless Tobacco Never  No Known Allergies Objective:  There were no vitals filed for this visit. There is no height or weight on file to calculate BMI. Constitutional Well developed. Well nourished.  Vascular Dorsalis pedis pulses palpable bilaterally. Posterior tibial pulses palpable bilaterally. Capillary refill normal to all digits.  No cyanosis or clubbing noted. Pedal hair growth normal.  Neurologic Normal speech. Oriented to person, place, and time. Epicritic sensation to light touch grossly present bilaterally.  Dermatologic Right hallux distal tip hyperkeratotic lesion/preulcerative callus.  Mild pain on palpation.  No open wounds or lesion noted.  Orthopedic: Normal joint ROM without pain or crepitus bilaterally. No visible deformities. No bony tenderness.   Radiographs: None Assessment:   1. Pre-ulcerative calluses   2. Type 2 diabetes mellitus with polyneuropathy (HCC)     Plan:  Patient was evaluated and treated and all questions answered.  Right hallux distal tip preulcerative callus without ulcer -All questions and concerns were discussed with the patient extensive detail -Using chisel blade handle the lesion was debrided down to healthy striated tissue.  No complication and no pinpoint bleeding noted.  No open wounds or lesion noted -I discussed shoe gear modification and glucose control he states understanding.  No follow-ups on file.

## 2023-06-19 ENCOUNTER — Ambulatory Visit: Payer: Self-pay

## 2023-06-19 NOTE — Patient Instructions (Signed)
Visit Information  Thank you for taking time to visit with me today. Please don't hesitate to contact me if I can be of assistance to you.   Following are the goals we discussed today:   Goals Addressed             This Visit's Progress    To get a second opinion for cause of stomach pain   On track    Care Coordination Interventions: Evaluation of current treatment plan related to persistent abdominal pain and patient's adherence to plan as established by provider Determined patient left a voice message with [facility] requesting a return call, determined patient did not receive a return call  Offered to provide patient with the contact number for this provider, determined patient has the contact number and will call this provider when ready      To lower A1c <7.0%   On track    Care Coordination Interventions: Provided education to patient about basic DM disease process Reviewed and discussed upcoming scheduled follow up with PCP on 08/29/23 for evaluation of dm     To schedule a follow up with [facility] as directed   On track    Care Coordination Interventions: Evaluation of current treatment plan related to enlarged prostate and patient's adherence to plan as established by provider Determined patient completed a follow up with his PCP for evaluation of enlarged prostate Review of patient status, including review of consultant's reports, relevant laboratory and other test results, and medications completed Determined patient was not referred to Urology, he plans to schedule an earlier appointment with Dr. Alvis Lemmings in order to request a Urology follow up for evaluation of Moderate to marked severity prostatomegaly as noted on abdominal CT completed on 12/19/22     To work with PT for low back pain, ankle pain and knee pain   On track    Care Coordination Interventions: Determined patient established with pain management  Review of patient status, including review of  consultant's reports, relevant laboratory and other test results, and medications completed Discussed importance of adherence to all scheduled medical appointments Counseled on the importance of reporting any/all new or changed pain symptoms or management strategies to pain management provider        Our next appointment is by telephone on 09/04/23 at 2:00 PM   Please call the care guide team at [phone] if you need to cancel or reschedule your appointment.   If you are experiencing a Mental Health or Behavioral Health Crisis or need someone to talk to, please call 1-800-273-TALK (toll free, 24 hour hotline)  Patient verbalizes understanding of instructions and care plan provided today and agrees to view in MyChart. Active MyChart status and patient understanding of how to access instructions and care plan via MyChart confirmed with patient.     Delsa Sale RN BSN CCM [place]  [facility], [facility] Nurse Care Coordinator  Direct Dial: [phone] Website: [email or web address]

## 2023-06-19 NOTE — Patient Outreach (Signed)
Care Coordination   Follow Up Visit Note   06/19/2023 Name: Edward [name] Sr. MRN: [id number] DOB: [birth year]  Edward Baltimore Sr. is a 52 y.o. year old male who sees Hoy Register, MD for primary care. I spoke with  Edward Baltimore Sr. by phone today.  What matters to the patients health and wellness today?  Patient would like to establish with Urology for evaluation of enlarged prostate.     Goals Addressed             This Visit's Progress    To get a second opinion for cause of stomach pain   On track    Care Coordination Interventions: Evaluation of current treatment plan related to persistent abdominal pain and patient's adherence to plan as established by provider Determined patient left a voice message with [facility] requesting a return call, determined patient did not receive a return call  Offered to provide patient with the contact number for this provider, determined patient has the contact number and will call this provider when ready      To lower A1c <7.0%   On track    Care Coordination Interventions: Provided education to patient about basic DM disease process Reviewed and discussed upcoming scheduled follow up with PCP on 08/29/23 for evaluation of dm     To schedule a follow up with [facility] as directed   On track    Care Coordination Interventions: Evaluation of current treatment plan related to enlarged prostate and patient's adherence to plan as established by provider Determined patient completed a follow up with his PCP for evaluation of enlarged prostate Review of patient status, including review of consultant's reports, relevant laboratory and other test results, and medications completed Determined patient was not referred to Urology, he plans to schedule an earlier appointment with Dr. Alvis Lemmings in order to request a Urology follow up for evaluation of Moderate to marked severity prostatomegaly as noted on abdominal CT  completed on 12/19/22      To work with PT for low back pain, ankle pain and knee pain   On track    Care Coordination Interventions: Determined patient established with pain management  Review of patient status, including review of consultant's reports, relevant laboratory and other test results, and medications completed Discussed importance of adherence to all scheduled medical appointments Counseled on the importance of reporting any/all new or changed pain symptoms or management strategies to pain management provider    Interventions Today    Flowsheet Row Most Recent Value  Chronic Disease   Chronic disease during today's visit Diabetes, Other  [chronic pain,  enlarged prostate]  General Interventions   General Interventions Discussed/Reviewed General Interventions Discussed, General Interventions Reviewed, Doctor Visits, Labs, Communication with  Doctor Visits Discussed/Reviewed Doctor Visits Discussed, Doctor Visits Reviewed, Specialist, PCP  Communication with PCP/Specialists  [Dr. Newlin]  Education Interventions   Education Provided Provided Education  Provided Verbal Education On When to see the doctor, Medication, Labs  Labs Reviewed Hgb A1c  Pharmacy Interventions   Pharmacy Dicussed/Reviewed Pharmacy Topics Discussed, Pharmacy Topics Reviewed, Medications and their functions          SDOH assessments and interventions completed:  No     Care Coordination Interventions:  Yes, provided   Follow up plan: Follow up call scheduled for 09/04/23 @2 :00 PM     Encounter Outcome:  Patient Visit Completed

## 2023-06-20 DIAGNOSIS — E890 Postprocedural hypothyroidism: Secondary | ICD-10-CM | POA: Diagnosis not present

## 2023-06-20 DIAGNOSIS — E1121 Type 2 diabetes mellitus with diabetic nephropathy: Secondary | ICD-10-CM | POA: Diagnosis not present

## 2023-06-20 DIAGNOSIS — E0500 Thyrotoxicosis with diffuse goiter without thyrotoxic crisis or storm: Secondary | ICD-10-CM | POA: Diagnosis not present

## 2023-07-26 ENCOUNTER — Ambulatory Visit (INDEPENDENT_AMBULATORY_CARE_PROVIDER_SITE_OTHER): Payer: 59 | Admitting: Podiatry

## 2023-07-26 ENCOUNTER — Encounter: Payer: Self-pay | Admitting: Podiatry

## 2023-07-26 VITALS — Ht 72.0 in | Wt 195.4 lb

## 2023-07-26 DIAGNOSIS — E1142 Type 2 diabetes mellitus with diabetic polyneuropathy: Secondary | ICD-10-CM

## 2023-07-26 DIAGNOSIS — B351 Tinea unguium: Secondary | ICD-10-CM | POA: Diagnosis not present

## 2023-07-26 NOTE — Progress Notes (Signed)
This patient returns to my office for at risk foot care.  This patient requires this care by a professional since this patient will be at risk due to having ESRD, Blind,   and DM with polyneuropathy.  This patient is unable to cut nails himself since the patient cannot reach his nails.These nails are painful walking and wearing shoes.  Patient has kidney transplant. This patient presents for at risk foot care today.  General Appearance  Alert, conversant and in no acute stress.  Vascular  Dorsalis pedis and posterior tibial  pulses are  weakly palpable  bilaterally.  Capillary return is within normal limits  bilaterally. Cold feet. bilaterally.  Neurologic  Senn-Weinstein monofilament wire test diminished  bilaterally. Muscle power within normal limits bilaterally.  Nails Thick disfigured discolored nails with subungual debris  from hallux to fifth toes bilaterally. No evidence of bacterial infection or drainage bilaterally. HAV  B/L.  Orthopedic  No limitations of motion  feet .  No crepitus or effusions noted.  No bony pathology or digital deformities noted.  HAV  B/L.  Numbness, shooting pain into his right big toe.  Skin  normotropic skin with no porokeratosis noted bilaterally.  No signs of infections or ulcers noted.  Callus distal aspect right hallux.   Onychomycosis  Pain in right toes  Pain in left toes  Consent was obtained for treatment procedures.   Mechanical debridement of nails 1-5  bilaterally performed with a nail nipper.  Filed with dremel without incident.    Return office visit  3 months                    Told patient to return for periodic foot care and evaluation due to potential at risk complications.   Helane Gunther DPM

## 2023-08-22 ENCOUNTER — Other Ambulatory Visit: Payer: Self-pay | Admitting: *Deleted

## 2023-08-22 DIAGNOSIS — N186 End stage renal disease: Secondary | ICD-10-CM

## 2023-08-23 ENCOUNTER — Ambulatory Visit (HOSPITAL_COMMUNITY)
Admission: RE | Admit: 2023-08-23 | Discharge: 2023-08-23 | Disposition: A | Discharge status: Home / Self Care | Payer: 59 | Source: Ambulatory Visit | Attending: Vascular Surgery | Admitting: Vascular Surgery

## 2023-08-23 DIAGNOSIS — N186 End stage renal disease: Secondary | ICD-10-CM | POA: Diagnosis not present

## 2023-08-23 DIAGNOSIS — Z992 Dependence on renal dialysis: Secondary | ICD-10-CM | POA: Diagnosis not present

## 2023-08-24 ENCOUNTER — Ambulatory Visit: Payer: 59 | Admitting: Vascular Surgery

## 2023-08-24 ENCOUNTER — Encounter: Payer: Self-pay | Admitting: Vascular Surgery

## 2023-08-24 VITALS — BP 143/80 | HR 73 | Temp 97.7°F | Resp 20 | Ht 72.0 in | Wt 213.0 lb

## 2023-08-24 DIAGNOSIS — T82848A Pain from vascular prosthetic devices, implants and grafts, initial encounter: Secondary | ICD-10-CM

## 2023-08-24 NOTE — Progress Notes (Signed)
Office Note     CC: Right shoulder pain Requesting Provider:  Hoy Register, MD  HPI: Edward Baltimore Sr. is a 52 y.o. (26-Aug-1971) male who presents at the request of Hoy Register, MD for evaluation of right shoulder pain.  On exam, Edward [name] was doing well.  A native of [place], he moved down to [place] when he married his first wife.  He has had end-stage renal disease for quite some time and was dialyzed through a right sided brachiocephalic fistula prior to receiving a kidney transplant.  He has had this transplant for a year, and has not been using his left-sided fistula.  The fistula remains patent.  At the time of fistula creation, Edward [name] had pain in his fistula.  The pain that was initially at the creation site has moved to the right shoulder.  The shoulder pain has been present for a number of years, and per Edward [name], he has been diagnosed with bilateral rotator cuff issues, which he has not followed up with.  Over the last few months, he has had severe right-sided shoulder pain which has severely limited his range of motion.  At rest, there is minimal pain, however with any abduction, extension, internal/external rotation there is significant pain.  There is pain to palpation.  No associated edema, intermittent numbness in the hand however the numbness occurs bilaterally.   Past Medical History:  Diagnosis Date   Acute on chronic renal failure (HCC) 11/25/2015   Anemia    Arthritis    Blind    Left eye   Cataract    CHF (congestive heart failure) (HCC)    Chronic kidney disease    stage 5   COVID-19    Depression    situatuional   Diabetes mellitus    Type II   GERD (gastroesophageal reflux disease)    Graves disease 2014   Headache    in past   Hx of adenomatous and sessile serrated colonic polyps 11/21/2019   Hyperlipidemia    Hypertension    Hypothyroidism    Legally blind    right eye   Neuropathy    Non-compliance    Proteinuria  05/24/2020   Shortness of breath dyspnea    "Better since I've been taking my medications"    Past Surgical History:  Procedure Laterality Date   AV FISTULA PLACEMENT Right 04/22/2019   Procedure: RIGHT ARM ARTERIOVENOUS (AV) FISTULA CREATION;  Surgeon: Chuck Hint, MD;  Location: [facility] OR;  Service: Vascular;  Laterality: Right;   CIRCUMCISION     as a child, around 55 years of age   COLONOSCOPY     EYE SURGERY Bilateral    "several "   PARS PLANA VITRECTOMY Right 03/25/2016   Procedure: PARS PLANA VITRECTOMY 25 GAUGE FOR ENDOPHTHALMITIS;  Surgeon: Carmela Rima, MD;  Location: [facility] OR;  Service: Ophthalmology;  Laterality: Right;   WISDOM TOOTH EXTRACTION      Social History   Socioeconomic History   Marital status: Divorced    Spouse name: Not on file   Number of children: 11   Years of education: Not on file   Highest education level: Not on file  Occupational History   Occupation: disabled  Tobacco Use   Smoking status: Former    Current packs/day: 0.00    Average packs/day: 0.8 packs/day for 3.0 years (2.3 ttl pk-yrs)    Types: Cigarettes    Start date: 05/28/1997    Quit date: 05/28/2000  Years since quitting: 23.2   Smokeless tobacco: Never  Vaping Use   Vaping status: Never Used  Substance and Sexual Activity   Alcohol use: Not Currently    Alcohol/week: 0.0 standard drinks of alcohol    Comment: 1 beer  ocassional   Drug use: Yes    Types: Marijuana    Comment: daily for pain   Sexual activity: Not on file  Other Topics Concern   Not on file  Social History Narrative   Divorced   59 children   24 yo son with him at home   Disabled chef - cafeterias, Cytogeneticist, Edward [name]'s, etc      Former cigarette smoker, marijuana now, rare alcohol, other drugs   Social Determinants of Health   Financial Resource Strain: Low Risk  (02/02/2023)   Overall Financial Resource Strain (CARDIA)    Difficulty of Paying Living Expenses: Not hard at all  Food Insecurity:  Low Risk  (06/20/2023)   Received from [facility]   Hunger Vital Sign    Worried About Running Out of Food in the Last Year: Never true    Ran Out of Food in the Last Year: Never true  Transportation Needs: No Transportation Needs (06/20/2023)   Received from [facility]    In the past 12 months, has lack of reliable transportation kept you from medical appointments, meetings, work or from getting things needed for daily living? : No  Physical Activity: Insufficiently Active (02/02/2023)   Exercise Vital Sign    Days of Exercise per Week: 3 days    Minutes of Exercise per Session: 30 min  Stress: No Stress Concern Present (02/02/2023)   [employer] of Occupational Health - Occupational Stress Questionnaire    Feeling of Stress : Not at all  Social Connections: Not on file  Intimate Partner Violence: Low Risk  (09/20/2022)   Received from [facility] visits prior to 12/03/2022., [facility] visits prior to 12/03/2022.   Safety    How often does anyone, including family and friends, physically hurt you?: Never    How often does anyone, including family and friends, insult or talk down to you?: Never    How often does anyone, including family and friends, threaten you with harm?: Never    How often does anyone, including family and friends, scream or curse at you?: Never   Family History  Problem Relation Age of Onset   Hypertension Mother    Diabetes Mother    Bladder Cancer Brother    Kidney disease Other    Colon cancer Neg Hx    Rectal cancer Neg Hx    Esophageal cancer Neg Hx    Stomach cancer Neg Hx     Current Outpatient Medications  Medication Sig Dispense Refill   Accu-Chek Softclix Lancets lancets Use to check blood sugar once daily. E11.39 100 each 2   acetaminophen (TYLENOL) 500 MG tablet Take 1 tablet (500 mg total) by mouth every 6 (six) hours as needed for moderate pain or mild pain. 100 tablet 1    Alcohol Swabs (B-D SINGLE USE SWABS REGULAR) PADS Use as directed. 100 each 6   ammonium lactate (AMLACTIN DAILY) 12 % lotion Apply 1 Application topically as needed for dry skin. 400 g 1   atorvastatin (LIPITOR) 40 MG tablet Take 1 tablet (40 mg total) by mouth daily. 90 tablet 1   Blood Glucose Calibration (MYGLUCOHEALTH CONTROL) SOLN  Blood Glucose Monitoring Suppl (ACCU-CHEK GUIDE) w/Device KIT Use to check blood sugar once daily. E11.39 1 kit 0   carvedilol (COREG) 12.5 MG tablet Take 12.5 mg by mouth 2 (two) times daily with a meal.     ciclopirox (PENLAC) 8 % solution Apply topically at bedtime. Apply over nail and surrounding skin. Apply daily over previous coat. After seven (7) days, may remove with alcohol and continue cycle. 6.6 mL 0   cinacalcet (SENSIPAR) 90 MG tablet Take 90 mg by mouth daily.     diclofenac Sodium (VOLTAREN) 1 % GEL Apply 4 g topically 4 (four) times daily. 100 g 1   dicyclomine (BENTYL) 20 MG tablet Take 1 tablet (20 mg total) by mouth 2 (two) times daily as needed for spasms. 20 tablet 0   DULoxetine (CYMBALTA) 60 MG capsule Take 1 capsule (60 mg total) by mouth daily. 30 capsule 3   furosemide (LASIX) 80 MG tablet Take 1 tablet (80 mg total) by mouth 2 (two) times daily. On the days you DO NOT have dialysis 34 tablet 2   glipiZIDE (GLUCOTROL XL) 5 MG 24 hr tablet Take 1 tablet by mouth daily.     glucose blood (ACCU-CHEK GUIDE) test strip Use to check blood sugar once daily. E11.39 100 each 2   JANUVIA 25 MG tablet Take 1 tablet by mouth daily.     levothyroxine (SYNTHROID) 137 MCG tablet Take 1 tablet (137 mcg total) by mouth daily before breakfast. 30 tablet 2   metoCLOPramide (REGLAN) 10 MG tablet Take 1 tablet (10 mg total) by mouth every 6 (six) hours as needed for nausea (nausea/headache). 10 tablet 0   mycophenolate (MYFORTIC) 180 MG EC tablet Take 180 mg by mouth 2 (two) times daily.     ondansetron (ZOFRAN-ODT) 4 MG disintegrating tablet Take 1 tablet  (4 mg total) by mouth every 8 (eight) hours as needed for nausea or vomiting. 20 tablet 0   oxyCODONE (ROXICODONE) 5 MG immediate release tablet Take 1 tablet (5 mg total) by mouth every 4 (four) hours as needed for severe pain. 12 tablet 0   pantoprazole (PROTONIX) 40 MG tablet Take 1 tablet (40 mg total) by mouth daily. 30 tablet 2   polyethylene glycol powder (GLYCOLAX/MIRALAX) 17 GM/SCOOP powder Take by mouth.     pregabalin (LYRICA) 25 MG capsule Take 25 mg (1 capsule) in the morning and afternoon and 50 mg (2 capsules) at bedtime for 10 days. Then increase to 50 mg (2 capsules) in the morning, 25 mg (1 capsule) in the afternoon, and 50 mg (2 capsules) at bedtime for 10 days. Then increase to 50 mg (2 capsules) three times daily after that.     promethazine (PHENERGAN) 25 MG suppository Place 1 suppository (25 mg total) rectally every 6 (six) hours as needed for nausea or vomiting. 12 each 0   promethazine (PHENERGAN) 25 MG tablet Take 1 tablet (25 mg total) by mouth every 6 (six) hours as needed for nausea or vomiting. 30 tablet 0   senna-docusate (SENOKOT-S) 8.6-50 MG tablet Take by mouth.     SENSIPAR 30 MG tablet Take 30 mg by mouth daily.     sevelamer carbonate (RENVELA) 800 MG tablet Take 1 tablet (800 mg total) by mouth 3 (three) times daily with meals. 90 tablet 0   sildenafil (VIAGRA) 100 MG tablet Take 1 tablet (100 mg total) by mouth daily as needed for erectile dysfunction. 10 tablet 1   sulfamethoxazole-trimethoprim (BACTRIM) 400-80 MG tablet  Take 1 tablet by mouth 3 (three) times a week.     tacrolimus (PROGRAF) 1 MG capsule Take 1 mg by mouth 2 (two) times daily.     VELPHORO 500 MG chewable tablet Chew 1,000 mg by mouth 3 (three) times daily.     IRON SUCROSE IV Iron Sucrose (Venofer)     potassium chloride SA (KLOR-CON M) 20 MEQ tablet Take 2 tablets (40 mEq total) by mouth daily for 3 days. 6 tablet 0   No current facility-administered medications for this visit.    No  Known Allergies   REVIEW OF SYSTEMS:  [X]  denotes positive finding, [ ]  denotes negative finding Cardiac  Comments:  Chest pain or chest pressure:    Shortness of breath upon exertion:    Short of breath when lying flat:    Irregular heart rhythm:        Vascular    Pain in calf, thigh, or hip brought on by ambulation:    Pain in feet at night that wakes you up from your sleep:     Blood clot in your veins:    Leg swelling:         Pulmonary    Oxygen at home:    Productive cough:     Wheezing:         Neurologic    Sudden weakness in arms or legs:     Sudden numbness in arms or legs:     Sudden onset of difficulty speaking or slurred speech:    Temporary loss of vision in one eye:     Problems with dizziness:         Gastrointestinal    Blood in stool:     Vomited blood:         Genitourinary    Burning when urinating:     Blood in urine:        Psychiatric    Major depression:         Hematologic    Bleeding problems:    Problems with blood clotting too easily:        Skin    Rashes or ulcers:        Constitutional    Fever or chills:      PHYSICAL EXAMINATION:  Vitals:   08/24/23 1446  BP: (!) 143/80  Pulse: 73  Resp: 20  Temp: 97.7 F (36.5 C)  SpO2: 99%  Weight: 213 lb (96.6 kg)  Height: 6' (1.829 m)    General:  WDWN in NAD; vital signs documented above Gait: Not observed HENT: WNL, normocephalic Pulmonary: normal non-labored breathing , without Rales, rhonchi,  wheezing Cardiac: regular HR,  Abdomen: soft, NT, no masses Skin: without rashes Vascular Exam/Pulses:  Right Left  Radial 2+ (normal) 2+ (normal)                      Palpable thrill within the right arm fistula Extremities: without ischemic changes, without Gangrene , without cellulitis; without open wounds;  Musculoskeletal: no muscle wasting or atrophy  Neurologic: A&O X 3;  No focal weakness or paresthesias are detected Psychiatric:  The pt has Normal  affect.   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     ASSESSMENT/PLAN:: 52 y.o. male presenting with  pain in the right shoulder radiating into the arm.  There is no associated edema.  No symptoms of steal syndrome.  Prior to interviewing Oryon, I assumed that his right upper extremity pain was likely due to central stenosis as demonstrated on his dialysis duplex ultrasound.  Interestingly though, he has no associated swelling which is nearly always present with central stenosis.  Furthermore, central stenosis should not cause range of motion issues, and pain to palpation of the shoulder.  I think Hoos right shoulder pain is musculoskeletal, especially being that he has had rotator cuff issues in the past.  On physical exam, he could barely abduct, or extend.  The shoulder appeared frozen.  I have referred him to my colleague Dr. Steward Drone for further evaluation.    He is aware I would like to keep his fistula patent for as long as possible and as all transplants have a life expectancy.   Mchenry can follow-up with me as needed.  Should orthopedic surgery workup prove negative, we would move forward with right upper extremity fistulogram with likely venoplasty.    Victorino Sparrow, MD [facility] [phone]

## 2023-08-29 ENCOUNTER — Ambulatory Visit: Payer: 59 | Attending: Family Medicine | Admitting: Family Medicine

## 2023-08-29 ENCOUNTER — Encounter: Payer: Self-pay | Admitting: Family Medicine

## 2023-08-29 VITALS — BP 147/74 | HR 64 | Ht 72.0 in | Wt 210.0 lb

## 2023-08-29 DIAGNOSIS — E1139 Type 2 diabetes mellitus with other diabetic ophthalmic complication: Secondary | ICD-10-CM | POA: Diagnosis not present

## 2023-08-29 DIAGNOSIS — E1169 Type 2 diabetes mellitus with other specified complication: Secondary | ICD-10-CM

## 2023-08-29 DIAGNOSIS — E1142 Type 2 diabetes mellitus with diabetic polyneuropathy: Secondary | ICD-10-CM

## 2023-08-29 DIAGNOSIS — Z7984 Long term (current) use of oral hypoglycemic drugs: Secondary | ICD-10-CM

## 2023-08-29 DIAGNOSIS — E890 Postprocedural hypothyroidism: Secondary | ICD-10-CM | POA: Diagnosis not present

## 2023-08-29 DIAGNOSIS — Z1211 Encounter for screening for malignant neoplasm of colon: Secondary | ICD-10-CM

## 2023-08-29 DIAGNOSIS — K5909 Other constipation: Secondary | ICD-10-CM

## 2023-08-29 DIAGNOSIS — R109 Unspecified abdominal pain: Secondary | ICD-10-CM

## 2023-08-29 DIAGNOSIS — N529 Male erectile dysfunction, unspecified: Secondary | ICD-10-CM

## 2023-08-29 DIAGNOSIS — M7501 Adhesive capsulitis of right shoulder: Secondary | ICD-10-CM

## 2023-08-29 DIAGNOSIS — E785 Hyperlipidemia, unspecified: Secondary | ICD-10-CM | POA: Diagnosis not present

## 2023-08-29 DIAGNOSIS — M2550 Pain in unspecified joint: Secondary | ICD-10-CM | POA: Diagnosis not present

## 2023-08-29 DIAGNOSIS — M7502 Adhesive capsulitis of left shoulder: Secondary | ICD-10-CM | POA: Diagnosis not present

## 2023-08-29 LAB — POCT GLYCOSYLATED HEMOGLOBIN (HGB A1C): HbA1c, POC (controlled diabetic range): 6.1 % (ref 0.0–7.0)

## 2023-08-29 MED ORDER — ATORVASTATIN CALCIUM 40 MG PO TABS: 40.0000 mg | ORAL_TABLET | Freq: Every day | ORAL | 1 refills | Status: DC

## 2023-08-29 MED ORDER — POLYETHYLENE GLYCOL 3350 17 GM/SCOOP PO POWD: ORAL | 1 refills | Status: AC

## 2023-08-29 MED ORDER — SILDENAFIL CITRATE 100 MG PO TABS: 100.0000 mg | ORAL_TABLET | Freq: Every day | ORAL | 1 refills | Status: DC | PRN

## 2023-08-29 MED ORDER — DICYCLOMINE HCL 20 MG PO TABS: 20.0000 mg | ORAL_TABLET | Freq: Two times a day (BID) | ORAL | 2 refills | Status: DC | PRN

## 2023-08-29 MED ORDER — DULOXETINE HCL 60 MG PO CPEP: 60.0000 mg | ORAL_CAPSULE | Freq: Every day | ORAL | 1 refills | Status: DC

## 2023-08-29 NOTE — Progress Notes (Signed)
Subjective:  Patient ID: Edward Baltimore Sr., male    DOB: 06/28/1971  Age: 52 y.o. MRN: [id number]  CC: Medical Management of Chronic Issues (Body pain)   HPI Edward SHIMANEK Sr. is a 52 y.o. year old male with a history of hypertension, type 2 diabetes mellitus (A1c 6.1), Graves' disease (status post radioactive iodine treatment in 07/2017), ESRD (status post renal transplant at [facility] on 01/06/2022), erectile dysfunction, previous bilateral lower extremity DVT (completed anticoagulation)   Interval History: Discussed the use of AI scribe software for clinical note transcription with the patient, who gave verbal consent to proceed.  He presents with widespread body pain, including in the knees, back, and shoulders. The pain is constant and rates it as a 10 on a scale of 1 to 10. The patient is currently on Lyrica for pain, which is not providing relief despite different doses and trials.he previously received oxycodone which he stated was more effective as it made him sleep and did not feel his pain.  The patient also reports shoulder pain with associated restriction in range of motion right shoulder greater than left, which is scheduled to be evaluated by an orthopedic specialist tomorrow.   The patient also reports abdominal spasms and was prescribed Bentyl by his vascular doctor.  He also complains of constipation and is requesting refill on MiraLAX.  For diabetes, the patient is on glipizide and Januvia. The dosage of glipizide was recently reduced from 5 mg to 2.5 mg by his endocrinologist at [facility] due to a low A1c reading.  A1c today 6.1.   The patient also takes levothyroxine for hypothyroidism. The last thyroid level check showed high TSH.    He continues to follow-up with the transplant clinic at [facility] who also prescribed his antihypertensives.  His blood pressure is slightly elevated today and he attributes this to pain.   Past Medical History:   Diagnosis Date   Acute on chronic renal failure (HCC) 11/25/2015   Anemia    Arthritis    Blind    Left eye   Cataract    CHF (congestive heart failure) (HCC)    Chronic kidney disease    stage 5   COVID-19    Depression    situatuional   Diabetes mellitus    Type II   GERD (gastroesophageal reflux disease)    Graves disease 2014   Headache    in past   Hx of adenomatous and sessile serrated colonic polyps 11/21/2019   Hyperlipidemia    Hypertension    Hypothyroidism    Legally blind    right eye   Neuropathy    Non-compliance    Proteinuria 05/24/2020   Shortness of breath dyspnea    "Better since I've been taking my medications"    Past Surgical History:  Procedure Laterality Date   AV FISTULA PLACEMENT Right 04/22/2019   Procedure: RIGHT ARM ARTERIOVENOUS (AV) FISTULA CREATION;  Surgeon: Chuck Hint, MD;  Location: [facility] OR;  Service: Vascular;  Laterality: Right;   CIRCUMCISION     as a child, around 52 years of age   COLONOSCOPY     EYE SURGERY Bilateral    "several "   PARS PLANA VITRECTOMY Right 03/25/2016   Procedure: PARS PLANA VITRECTOMY 25 GAUGE FOR ENDOPHTHALMITIS;  Surgeon: Carmela Rima, MD;  Location: [facility] OR;  Service: Ophthalmology;  Laterality: Right;   WISDOM TOOTH EXTRACTION      Family History  Problem Relation Age of  Onset   Hypertension Mother    Diabetes Mother    Bladder Cancer Brother    Kidney disease Other    Colon cancer Neg Hx    Rectal cancer Neg Hx    Esophageal cancer Neg Hx    Stomach cancer Neg Hx     Social History   Socioeconomic History   Marital status: Divorced    Spouse name: Not on file   Number of children: 11   Years of education: Not on file   Highest education level: Not on file  Occupational History   Occupation: disabled  Tobacco Use   Smoking status: Former    Current packs/day: 0.00    Average packs/day: 0.8 packs/day for 3.0 years (2.3 ttl pk-yrs)    Types: Cigarettes    Start date:  05/28/1997    Quit date: 05/28/2000    Years since quitting: 23.2   Smokeless tobacco: Never  Vaping Use   Vaping status: Never Used  Substance and Sexual Activity   Alcohol use: Not Currently    Alcohol/week: 0.0 standard drinks of alcohol    Comment: 1 beer  ocassional   Drug use: Yes    Types: Marijuana    Comment: daily for pain   Sexual activity: Not on file  Other Topics Concern   Not on file  Social History Narrative   Divorced   65 children   67 yo son with him at home   Disabled chef - cafeterias, Cytogeneticist, [employer], etc      Former cigarette smoker, marijuana now, rare alcohol, other drugs   Social Determinants of Health   Financial Resource Strain: Low Risk  (02/02/2023)   Overall Financial Resource Strain (CARDIA)    Difficulty of Paying Living Expenses: Not hard at all  Food Insecurity: Low Risk  (06/20/2023)   Received from [facility]   Hunger Vital Sign    Worried About Running Out of Food in the Last Year: Never true    Ran Out of Food in the Last Year: Never true  Transportation Needs: No Transportation Needs (06/20/2023)   Received from [facility]    In the past 12 months, has lack of reliable transportation kept you from medical appointments, meetings, work or from getting things needed for daily living? : No  Physical Activity: Insufficiently Active (02/02/2023)   Exercise Vital Sign    Days of Exercise per Week: 3 days    Minutes of Exercise per Session: 30 min  Stress: No Stress Concern Present (02/02/2023)   [employer] of Occupational Health - Occupational Stress Questionnaire    Feeling of Stress : Not at all  Social Connections: Not on file    No Known Allergies  Outpatient Medications Prior to Visit  Medication Sig Dispense Refill   Accu-Chek Softclix Lancets lancets Use to check blood sugar once daily. E11.39 100 each 2   acetaminophen (TYLENOL) 500 MG tablet Take 1 tablet (500 mg total) by mouth every 6 (six) hours  as needed for moderate pain or mild pain. 100 tablet 1   Alcohol Swabs (B-D SINGLE USE SWABS REGULAR) PADS Use as directed. 100 each 6   ammonium lactate (AMLACTIN DAILY) 12 % lotion Apply 1 Application topically as needed for dry skin. 400 g 1   Blood Glucose Calibration (MYGLUCOHEALTH CONTROL) SOLN      Blood Glucose Monitoring Suppl (ACCU-CHEK GUIDE) w/Device KIT Use to check blood sugar once daily. E11.39 1 kit 0  carvedilol (COREG) 12.5 MG tablet Take 12.5 mg by mouth 2 (two) times daily with a meal.     ciclopirox (PENLAC) 8 % solution Apply topically at bedtime. Apply over nail and surrounding skin. Apply daily over previous coat. After seven (7) days, may remove with alcohol and continue cycle. 6.6 mL 0   cinacalcet (SENSIPAR) 90 MG tablet Take 90 mg by mouth daily.     diclofenac Sodium (VOLTAREN) 1 % GEL Apply 4 g topically 4 (four) times daily. 100 g 1   furosemide (LASIX) 80 MG tablet Take 1 tablet (80 mg total) by mouth 2 (two) times daily. On the days you DO NOT have dialysis 34 tablet 2   glipiZIDE (GLUCOTROL XL) 5 MG 24 hr tablet Take 1 tablet by mouth daily.     glucose blood (ACCU-CHEK GUIDE) test strip Use to check blood sugar once daily. E11.39 100 each 2   JANUVIA 25 MG tablet Take 1 tablet by mouth daily.     levothyroxine (SYNTHROID) 137 MCG tablet Take 1 tablet (137 mcg total) by mouth daily before breakfast. 30 tablet 2   metoCLOPramide (REGLAN) 10 MG tablet Take 1 tablet (10 mg total) by mouth every 6 (six) hours as needed for nausea (nausea/headache). 10 tablet 0   mycophenolate (MYFORTIC) 180 MG EC tablet Take 180 mg by mouth 2 (two) times daily.     ondansetron (ZOFRAN-ODT) 4 MG disintegrating tablet Take 1 tablet (4 mg total) by mouth every 8 (eight) hours as needed for nausea or vomiting. 20 tablet 0   oxyCODONE (ROXICODONE) 5 MG immediate release tablet Take 1 tablet (5 mg total) by mouth every 4 (four) hours as needed for severe pain. 12 tablet 0   pantoprazole  (PROTONIX) 40 MG tablet Take 1 tablet (40 mg total) by mouth daily. 30 tablet 2   pregabalin (LYRICA) 25 MG capsule Take 25 mg (1 capsule) in the morning and afternoon and 50 mg (2 capsules) at bedtime for 10 days. Then increase to 50 mg (2 capsules) in the morning, 25 mg (1 capsule) in the afternoon, and 50 mg (2 capsules) at bedtime for 10 days. Then increase to 50 mg (2 capsules) three times daily after that.     promethazine (PHENERGAN) 25 MG suppository Place 1 suppository (25 mg total) rectally every 6 (six) hours as needed for nausea or vomiting. 12 each 0   promethazine (PHENERGAN) 25 MG tablet Take 1 tablet (25 mg total) by mouth every 6 (six) hours as needed for nausea or vomiting. 30 tablet 0   senna-docusate (SENOKOT-S) 8.6-50 MG tablet Take by mouth.     SENSIPAR 30 MG tablet Take 30 mg by mouth daily.     sevelamer carbonate (RENVELA) 800 MG tablet Take 1 tablet (800 mg total) by mouth 3 (three) times daily with meals. 90 tablet 0   sulfamethoxazole-trimethoprim (BACTRIM) 400-80 MG tablet Take 1 tablet by mouth 3 (three) times a week.     tacrolimus (PROGRAF) 1 MG capsule Take 1 mg by mouth 2 (two) times daily.     VELPHORO 500 MG chewable tablet Chew 1,000 mg by mouth 3 (three) times daily.     atorvastatin (LIPITOR) 40 MG tablet Take 1 tablet (40 mg total) by mouth daily. 90 tablet 1   dicyclomine (BENTYL) 20 MG tablet Take 1 tablet (20 mg total) by mouth 2 (two) times daily as needed for spasms. 20 tablet 0   DULoxetine (CYMBALTA) 60 MG capsule Take 1 capsule (60  mg total) by mouth daily. 30 capsule 3   polyethylene glycol powder (GLYCOLAX/MIRALAX) 17 GM/SCOOP powder Take by mouth.     sildenafil (VIAGRA) 100 MG tablet Take 1 tablet (100 mg total) by mouth daily as needed for erectile dysfunction. 10 tablet 1   IRON SUCROSE IV Iron Sucrose (Venofer)     potassium chloride SA (KLOR-CON M) 20 MEQ tablet Take 2 tablets (40 mEq total) by mouth daily for 3 days. 6 tablet 0   No  facility-administered medications prior to visit.     ROS Review of Systems  Constitutional:  Negative for activity change and appetite change.  HENT:  Negative for sinus pressure and sore throat.   Respiratory:  Negative for chest tightness, shortness of breath and wheezing.   Cardiovascular:  Negative for chest pain and palpitations.  Gastrointestinal:  Positive for constipation. Negative for abdominal distention and abdominal pain.  Genitourinary: Negative.   Musculoskeletal:        See HPI  Psychiatric/Behavioral:  Negative for behavioral problems and dysphoric mood.     Objective:  BP (!) 147/74   Pulse 64   Ht 6' (1.829 m)   Wt 210 lb (95.3 kg)   SpO2 100%   BMI 28.48 kg/m      08/29/2023    8:52 AM 08/24/2023    2:46 PM 07/26/2023    8:09 AM  BP/Weight  Systolic BP 147 143   Diastolic BP 74 80   Wt. (Lbs) 210 213 195.4  BMI 28.48 kg/m2 28.89 kg/m2 26.5 kg/m2      Physical Exam Constitutional:      Appearance: He is well-developed.  Cardiovascular:     Rate and Rhythm: Normal rate.     Heart sounds: Normal heart sounds. No murmur heard. Pulmonary:     Effort: Pulmonary effort is normal.     Breath sounds: Normal breath sounds. No wheezing or rales.  Chest:     Chest wall: No tenderness.  Abdominal:     General: Bowel sounds are normal. There is no distension.     Palpations: Abdomen is soft. There is no mass.     Tenderness: There is no abdominal tenderness.  Musculoskeletal:     Right shoulder: Tenderness present. Decreased range of motion (severely restricted abduction).     Left shoulder: No tenderness. Decreased range of motion.     Right lower leg: No edema.     Left lower leg: No edema.  Neurological:     Mental Status: He is alert and oriented to person, place, and time.  Psychiatric:        Mood and Affect: Mood normal.    Diabetic Foot Exam - Simple   Simple Foot Form Diabetic Foot exam was performed with the following findings: Yes  08/29/2023  9:16 AM  Visual Inspection See comments: Yes Sensation Testing Intact to touch and monofilament testing bilaterally: Yes Pulse Check Posterior Tibialis and Dorsalis pulse intact bilaterally: Yes Comments Right great toenail is thick and dystrophic.  Dry skin on soles of both feet.        Latest Ref Rng & Units 12/23/2022    9:47 PM 12/19/2022    3:12 PM 03/20/2022    9:31 PM  CMP  Glucose 70 - 99 mg/dL 846  962  952   BUN 6 - 20 mg/dL 40  19  30   Creatinine 0.61 - 1.24 mg/dL 8.41  3.24  4.01   Sodium 135 - 145 mmol/L 129  136  138   Potassium 3.5 - 5.1 mmol/L 3.6  4.3  3.1   Chloride 98 - 111 mmol/L 97  108  103   CO2 22 - 32 mmol/L 21  21  21    Calcium 8.9 - 10.3 mg/dL 8.9  9.6  9.3   Total Protein 6.5 - 8.1 g/dL 7.5  8.8  7.9   Total Bilirubin 0.3 - 1.2 mg/dL 1.8  1.3  1.5   Alkaline Phos 38 - 126 U/L 78  108  95   AST 15 - 41 U/L 27  27  21    ALT 0 - 44 U/L 23  30  18      Lipid Panel     Component Value Date/Time   CHOL 137 10/21/2019 0854   TRIG 116 10/21/2019 0854   HDL 55 10/21/2019 0854   CHOLHDL 2.5 10/21/2019 0854   CHOLHDL 2.8 09/19/2016 1023   VLDL 37 (H) 09/19/2016 1023   LDLCALC 61 10/21/2019 0854    CBC    Component Value Date/Time   WBC 8.0 12/23/2022 2147   RBC 4.68 12/23/2022 2147   HGB 15.1 12/23/2022 2147   HCT 42.0 12/23/2022 2147   PLT 223 12/23/2022 2147   MCV 89.7 12/23/2022 2147   MCH 32.3 12/23/2022 2147   MCHC 36.0 12/23/2022 2147   RDW 12.7 12/23/2022 2147   LYMPHSABS 0.9 12/23/2022 2147   MONOABS 1.0 12/23/2022 2147   EOSABS 0.0 12/23/2022 2147   BASOSABS 0.0 12/23/2022 2147    Lab Results  Component Value Date   HGBA1C 6.1 08/29/2023    Lab Results  Component Value Date   TSH 138.59 (H) 08/21/2020    Assessment & Plan:      Chronic Pain Diffuse body pain. Currently managed with Lyrica without significant relief. Patient is also attending a pain management clinic. -Refer to physical therapy,  specifically water therapy, for additional pain management.  Bilateral adhesive capsulitis -Refer to orthopedics for evaluation of shoulder pain, with consideration for possible cortisone injections  Type II Diabetes Mellitus Well-controlled with recent A1c of 6.1. Currently managed with Glipizide 2.5mg  and Januvia 25mg . -Continue current management.  Hypothyroidism Last TSH level was high in September. Currently managed with Levothyroxine. -Check TSH level and adjust Levothyroxine dose as needed based on results.  Hyperlipidemia Currently managed with Atorvastatin. -Continue current management.  Abdominal Spasms Patient reports ongoing abdominal spasms, previously investigated with negative results. -Refill Dicyclomine for symptom management.   Hypertension BP slightly above goal likely due to pain No regimen change today Continue antihypertensive per transplant clinic Counseled on blood pressure goal of less than 130/80, low-sodium, DASH diet, medication compliance, 150 minutes of moderate intensity exercise per week. Discussed medication compliance, adverse effects.   Constipation Advised that he is on Bentyl which can also cause constipation as a side effect Counseled on increasing fiber intake, fruits and vegetable, limit intake of foods like cheese, white bread, white rice   Colonoscopy Last colonoscopy was a couple of years ago. -Refer for colonoscopy.  Eye Exam Patient reports feeling like sight is diminishing. -Recommend follow-up with ophthalmologist for evaluation.  General Health Maintenance -Refill Sildenafil, Miralax, and Cymbalta as requested by the patient. -Check patient's feet for any signs of diabetic neuropathy. -Plan to follow up in six months, or sooner if patient has any concerns.          Meds ordered this encounter  Medications   dicyclomine (BENTYL) 20 MG tablet    Sig: Take 1  tablet (20 mg total) by mouth 2 (two) times daily as needed  for spasms.    Dispense:  60 tablet    Refill:  2   atorvastatin (LIPITOR) 40 MG tablet    Sig: Take 1 tablet (40 mg total) by mouth daily.    Dispense:  90 tablet    Refill:  1   polyethylene glycol powder (GLYCOLAX/MIRALAX) 17 GM/SCOOP powder    Sig: Take as directed as needed for constipation    Dispense:  850 g    Refill:  1   sildenafil (VIAGRA) 100 MG tablet    Sig: Take 1 tablet (100 mg total) by mouth daily as needed for erectile dysfunction.    Dispense:  10 tablet    Refill:  1    Ar least 24 hours between doses   DULoxetine (CYMBALTA) 60 MG capsule    Sig: Take 1 capsule (60 mg total) by mouth daily. For chronic pain    Dispense:  90 capsule    Refill:  1    Follow-up: Return in about 6 months (around 02/26/2024) for Chronic medical conditions.       Hoy Register, MD, FAAFP. [facility] [place], [place] [phone]   08/29/2023, 9:16 AM

## 2023-08-29 NOTE — Patient Instructions (Signed)
VISIT SUMMARY:  During today's visit, we discussed your ongoing health concerns, including chronic pain, diabetes management, thyroid levels, and abdominal spasms. We also reviewed your medications and planned necessary referrals and follow-ups to ensure comprehensive care.  YOUR PLAN:  -CHRONIC PAIN: Chronic pain is long-lasting pain that persists beyond the usual recovery period or occurs along with a chronic health condition. We will refer you to physical therapy, specifically water therapy, to help manage your pain. Additionally, you will see an orthopedic specialist to evaluate your shoulder pain and consider the possibility of adhesive capsulitis.  -DIABETES MELLITUS: Diabetes mellitus is a condition where your blood sugar levels are too high. Your diabetes is well-controlled with your current medications, Glipizide and Januvia. Please continue with your current management.  -HYPOTHYROIDISM: Hypothyroidism is a condition where your thyroid gland does not produce enough thyroid hormone. We will check your TSH levels and adjust your Levothyroxine dose as needed based on the results.  -HYPERLIPIDEMIA: Hyperlipidemia is having high levels of fats (lipids) in your blood. You are currently managing this condition with Atorvastatin. Please continue with your current management.  -ABDOMINAL SPASMS: Abdominal spasms are involuntary contractions of the stomach muscles. We will refill your prescription for Dicyclomine to help manage these symptoms.  -COLONOSCOPY: A colonoscopy is a test that allows the doctor to look at the inner lining of your large intestine. Since your last colonoscopy was a couple of years ago, we will refer you for another one.  -EYE EXAM: An eye exam is a series of tests to evaluate your vision and check for eye diseases. Since you feel like your sight is diminishing, we recommend a follow-up with an ophthalmologist for evaluation.  -GENERAL HEALTH MAINTENANCE: We will refill  your prescriptions for Sildenafil, Miralax, and Cymbalta as requested. We also checked your feet for any signs of diabetic neuropathy and found no issues. Please plan to follow up in six months, or sooner if you have any concerns.  INSTRUCTIONS:  Please follow up with the orthopedic specialist for your shoulder pain evaluation and schedule a colonoscopy. Additionally, make an appointment with an ophthalmologist for an eye exam. We will recheck your TSH levels and adjust your Levothyroxine dose as needed. Continue with your current medications for diabetes and hyperlipidemia, and use Dicyclomine for abdominal spasms as prescribed. Plan to follow up in six months, or sooner if you have any concerns.

## 2023-08-30 ENCOUNTER — Ambulatory Visit (HOSPITAL_BASED_OUTPATIENT_CLINIC_OR_DEPARTMENT_OTHER): Payer: 59

## 2023-08-30 ENCOUNTER — Other Ambulatory Visit: Payer: Self-pay | Admitting: Family Medicine

## 2023-08-30 ENCOUNTER — Encounter (HOSPITAL_BASED_OUTPATIENT_CLINIC_OR_DEPARTMENT_OTHER): Payer: Self-pay | Admitting: Orthopaedic Surgery

## 2023-08-30 ENCOUNTER — Ambulatory Visit (HOSPITAL_BASED_OUTPATIENT_CLINIC_OR_DEPARTMENT_OTHER): Payer: 59 | Admitting: Orthopaedic Surgery

## 2023-08-30 DIAGNOSIS — M25511 Pain in right shoulder: Secondary | ICD-10-CM

## 2023-08-30 DIAGNOSIS — M19011 Primary osteoarthritis, right shoulder: Secondary | ICD-10-CM | POA: Diagnosis not present

## 2023-08-30 DIAGNOSIS — M545 Low back pain, unspecified: Secondary | ICD-10-CM

## 2023-08-30 DIAGNOSIS — G8929 Other chronic pain: Secondary | ICD-10-CM

## 2023-08-30 DIAGNOSIS — M47816 Spondylosis without myelopathy or radiculopathy, lumbar region: Secondary | ICD-10-CM | POA: Diagnosis not present

## 2023-08-30 DIAGNOSIS — M5136 Other intervertebral disc degeneration, lumbar region with discogenic back pain only: Secondary | ICD-10-CM | POA: Diagnosis not present

## 2023-08-30 DIAGNOSIS — M48061 Spinal stenosis, lumbar region without neurogenic claudication: Secondary | ICD-10-CM | POA: Diagnosis not present

## 2023-08-30 DIAGNOSIS — M7501 Adhesive capsulitis of right shoulder: Secondary | ICD-10-CM | POA: Diagnosis not present

## 2023-08-30 DIAGNOSIS — E0500 Thyrotoxicosis with diffuse goiter without thyrotoxic crisis or storm: Secondary | ICD-10-CM

## 2023-08-30 LAB — T3: T3, Total: 39 ng/dL — ABNORMAL LOW (ref 71–180)

## 2023-08-30 LAB — TSH: TSH: 23.3 u[IU]/mL — ABNORMAL HIGH (ref 0.450–4.500)

## 2023-08-30 LAB — T4, FREE: Free T4: 0.72 ng/dL — ABNORMAL LOW (ref 0.82–1.77)

## 2023-08-30 MED ORDER — LIDOCAINE HCL 1 % IJ SOLN
4.0000 mL | INTRAMUSCULAR | Status: AC | PRN
  Administered 2023-08-30: 4 mL

## 2023-08-30 MED ORDER — LEVOTHYROXINE SODIUM 150 MCG PO TABS: 150.0000 ug | ORAL_TABLET | Freq: Every day | ORAL | 1 refills | Status: DC

## 2023-08-30 MED ORDER — TRIAMCINOLONE ACETONIDE 40 MG/ML IJ SUSP
80.0000 mg | INTRAMUSCULAR | Status: AC | PRN
  Administered 2023-08-30: 80 mg via INTRA_ARTICULAR

## 2023-08-30 NOTE — Progress Notes (Signed)
Chief Complaint: Right shoulder pain     History of Present Illness:    Edward ROEBUCK Sr. is a 52 y.o. male right-hand-dominant male presents with ongoing right shoulder pain which has been chronic for multiple years.  He has been seen and referred by Dr. Sherral Hammers with vascular surgery.  He states that he is not really having pain with most activities and does not have a specific injury.  He does have a history of diabetes with a recent A1c of 6.1.  He has very limited range of motion in all planes.  He does enjoy being active although this has been limited by shoulder pain.  He does have a history of chronic low back pain.  He does have known degenerative disc disease in the setting of a old injury in high school where he was temporarily paraplegic.  This did recover after 48 hours although since that time he has been persistently painful in the back with radiation to bilateral lower extremities.  He does have weakness in the lower extremities    PMH/PSH/Family History/Social History/Meds/Allergies:    Past Medical History:  Diagnosis Date  . Acute on chronic renal failure (HCC) 11/25/2015  . Anemia   . Arthritis   . Blind    Left eye  . Cataract   . CHF (congestive heart failure) (HCC)   . Chronic kidney disease    stage 5  . COVID-19   . Depression    situatuional  . Diabetes mellitus    Type II  . GERD (gastroesophageal reflux disease)   . Graves disease 2014  . Headache    in past  . Hx of adenomatous and sessile serrated colonic polyps 11/21/2019  . Hyperlipidemia   . Hypertension   . Hypothyroidism   . Legally blind    right eye  . Neuropathy   . Non-compliance   . Proteinuria 05/24/2020  . Shortness of breath dyspnea    "Better since I've been taking my medications"   Past Surgical History:  Procedure Laterality Date  . AV FISTULA PLACEMENT Right 04/22/2019   Procedure: RIGHT ARM ARTERIOVENOUS (AV) FISTULA CREATION;  Surgeon: Chuck Hint, MD;   Location: [facility] OR;  Service: Vascular;  Laterality: Right;  . CIRCUMCISION     as a child, around 31 years of age  . COLONOSCOPY    . EYE SURGERY Bilateral    "several "  . PARS PLANA VITRECTOMY Right 03/25/2016   Procedure: PARS PLANA VITRECTOMY 25 GAUGE FOR ENDOPHTHALMITIS;  Surgeon: Carmela Rima, MD;  Location: [facility] OR;  Service: Ophthalmology;  Laterality: Right;  . WISDOM TOOTH EXTRACTION     Social History   Socioeconomic History  . Marital status: Divorced    Spouse name: Not on file  . Number of children: 11  . Years of education: Not on file  . Highest education level: Not on file  Occupational History  . Occupation: disabled  Tobacco Use  . Smoking status: Former    Current packs/day: 0.00    Average packs/day: 0.8 packs/day for 3.0 years (2.3 ttl pk-yrs)    Types: Cigarettes    Start date: 05/28/1997    Quit date: 05/28/2000    Years since quitting: 23.2  . Smokeless tobacco: Never  Vaping Use  . Vaping status: Never Used  Substance and Sexual Activity  . Alcohol use: Not Currently    Alcohol/week: 0.0 standard drinks of alcohol    Comment: 1 beer  ocassional  .  Drug use: Yes    Types: Marijuana    Comment: daily for pain  . Sexual activity: Not on file  Other Topics Concern  . Not on file  Social History Narrative   Divorced   11 children   71 yo son with him at home   Disabled chef - cafeterias, Cytogeneticist, [employer], etc      Former cigarette smoker, marijuana now, rare alcohol, other drugs   Social Determinants of Health   Financial Resource Strain: Low Risk  (02/02/2023)   Overall Financial Resource Strain (CARDIA)   . Difficulty of Paying Living Expenses: Not hard at all  Food Insecurity: Low Risk  (06/20/2023)   Received from [facility]   Hunger Vital Sign   . Worried About Programme researcher, broadcasting/film/video in the Last Year: Never true   . Ran Out of Food in the Last Year: Never true  Transportation Needs: No Transportation Needs (06/20/2023)   Received from  [employer]   . In the past 12 months, has lack of reliable transportation kept you from medical appointments, meetings, work or from getting things needed for daily living? : No  Physical Activity: Insufficiently Active (02/02/2023)   Exercise Vital Sign   . Days of Exercise per Week: 3 days   . Minutes of Exercise per Session: 30 min  Stress: No Stress Concern Present (02/02/2023)   [employer] of Occupational Health - Occupational Stress Questionnaire   . Feeling of Stress : Not at all  Social Connections: Not on file   Family History  Problem Relation Age of Onset  . Hypertension Mother   . Diabetes Mother   . Bladder Cancer Brother   . Kidney disease Other   . Colon cancer Neg Hx   . Rectal cancer Neg Hx   . Esophageal cancer Neg Hx   . Stomach cancer Neg Hx    No Known Allergies Current Outpatient Medications  Medication Sig Dispense Refill  . Accu-Chek Softclix Lancets lancets Use to check blood sugar once daily. E11.39 100 each 2  . acetaminophen (TYLENOL) 500 MG tablet Take 1 tablet (500 mg total) by mouth every 6 (six) hours as needed for moderate pain or mild pain. 100 tablet 1  . Alcohol Swabs (B-D SINGLE USE SWABS REGULAR) PADS Use as directed. 100 each 6  . ammonium lactate (AMLACTIN DAILY) 12 % lotion Apply 1 Application topically as needed for dry skin. 400 g 1  . atorvastatin (LIPITOR) 40 MG tablet Take 1 tablet (40 mg total) by mouth daily. 90 tablet 1  . Blood Glucose Calibration (MYGLUCOHEALTH CONTROL) SOLN     . Blood Glucose Monitoring Suppl (ACCU-CHEK GUIDE) w/Device KIT Use to check blood sugar once daily. E11.39 1 kit 0  . carvedilol (COREG) 12.5 MG tablet Take 12.5 mg by mouth 2 (two) times daily with a meal.    . ciclopirox (PENLAC) 8 % solution Apply topically at bedtime. Apply over nail and surrounding skin. Apply daily over previous coat. After seven (7) days, may remove with alcohol and continue cycle. 6.6 mL 0  . cinacalcet  (SENSIPAR) 90 MG tablet Take 90 mg by mouth daily.    . diclofenac Sodium (VOLTAREN) 1 % GEL Apply 4 g topically 4 (four) times daily. 100 g 1  . dicyclomine (BENTYL) 20 MG tablet Take 1 tablet (20 mg total) by mouth 2 (two) times daily as needed for spasms. 60 tablet 2  . DULoxetine (CYMBALTA) 60 MG capsule  Take 1 capsule (60 mg total) by mouth daily. For chronic pain 90 capsule 1  . furosemide (LASIX) 80 MG tablet Take 1 tablet (80 mg total) by mouth 2 (two) times daily. On the days you DO NOT have dialysis 34 tablet 2  . glipiZIDE (GLUCOTROL XL) 5 MG 24 hr tablet Take 1 tablet by mouth daily.    Marland Kitchen glucose blood (ACCU-CHEK GUIDE) test strip Use to check blood sugar once daily. E11.39 100 each 2  . IRON SUCROSE IV Iron Sucrose (Venofer)    . JANUVIA 25 MG tablet Take 1 tablet by mouth daily.    Marland Kitchen levothyroxine (SYNTHROID) 137 MCG tablet Take 1 tablet (137 mcg total) by mouth daily before breakfast. 30 tablet 2  . metoCLOPramide (REGLAN) 10 MG tablet Take 1 tablet (10 mg total) by mouth every 6 (six) hours as needed for nausea (nausea/headache). 10 tablet 0  . mycophenolate (MYFORTIC) 180 MG EC tablet Take 180 mg by mouth 2 (two) times daily.    . ondansetron (ZOFRAN-ODT) 4 MG disintegrating tablet Take 1 tablet (4 mg total) by mouth every 8 (eight) hours as needed for nausea or vomiting. 20 tablet 0  . oxyCODONE (ROXICODONE) 5 MG immediate release tablet Take 1 tablet (5 mg total) by mouth every 4 (four) hours as needed for severe pain. 12 tablet 0  . pantoprazole (PROTONIX) 40 MG tablet Take 1 tablet (40 mg total) by mouth daily. 30 tablet 2  . polyethylene glycol powder (GLYCOLAX/MIRALAX) 17 GM/SCOOP powder Take as directed as needed for constipation 850 g 1  . potassium chloride SA (KLOR-CON M) 20 MEQ tablet Take 2 tablets (40 mEq total) by mouth daily for 3 days. 6 tablet 0  . pregabalin (LYRICA) 25 MG capsule Take 25 mg (1 capsule) in the morning and afternoon and 50 mg (2 capsules) at bedtime  for 10 days. Then increase to 50 mg (2 capsules) in the morning, 25 mg (1 capsule) in the afternoon, and 50 mg (2 capsules) at bedtime for 10 days. Then increase to 50 mg (2 capsules) three times daily after that.    . promethazine (PHENERGAN) 25 MG suppository Place 1 suppository (25 mg total) rectally every 6 (six) hours as needed for nausea or vomiting. 12 each 0  . promethazine (PHENERGAN) 25 MG tablet Take 1 tablet (25 mg total) by mouth every 6 (six) hours as needed for nausea or vomiting. 30 tablet 0  . senna-docusate (SENOKOT-S) 8.6-50 MG tablet Take by mouth.    . SENSIPAR 30 MG tablet Take 30 mg by mouth daily.    . sevelamer carbonate (RENVELA) 800 MG tablet Take 1 tablet (800 mg total) by mouth 3 (three) times daily with meals. 90 tablet 0  . sildenafil (VIAGRA) 100 MG tablet Take 1 tablet (100 mg total) by mouth daily as needed for erectile dysfunction. 10 tablet 1  . sulfamethoxazole-trimethoprim (BACTRIM) 400-80 MG tablet Take 1 tablet by mouth 3 (three) times a week.    . tacrolimus (PROGRAF) 1 MG capsule Take 1 mg by mouth 2 (two) times daily.    . VELPHORO 500 MG chewable tablet Chew 1,000 mg by mouth 3 (three) times daily.     No current facility-administered medications for this visit.   No results found.  Review of Systems:   A ROS was performed including pertinent positives and negatives as documented in the HPI.  Physical Exam :   Constitutional: NAD and appears stated age Neurological: Alert and oriented Psych: Appropriate  affect and cooperative There were no vitals taken for this visit.   Comprehensive Musculoskeletal Exam:    Right shoulder with 90 degrees active and passive range of motion.  External rotation at side is to 45 with positive pain.  Internal rotation is to L3.  This is quite limited compared to the contralateral side.  Distal neurosensory exam is intact  There is stenotic type pain bilaterally down both legs.  Positive straight leg raise which is  worsened with forward lean of the trunk.  He has no obvious weakness with kick out or flexion of the knees.  Strong tibialis anterior bilaterally Station is intact throughout in all dermatomes   Imaging:   Xray (3 views right shoulder): Normal    I personally reviewed and interpreted the radiographs.   Assessment and Plan:   52 y.o. male with evidence of right shoulder adhesive capsulitis.  At this time I have advised I would like to begin with ultrasound-guided injection of the right shoulder.  I would like to see him back in 2 weeks to assess the efficacy of this.  I did advise him on gentle wall climbs that he can begin working on range of motion of the shoulder.  With regard to his lower back I would like to obtain an MRI and this as he does likely have significant lumbar stenosis which may need to be addressed in the future.  We will plan to proceed with this and I will see him back in 2 weeks to assess his progress    Procedure Note  Patient: Edward MATOTT Sr.             Date of Birth: [birth year]           MRN: [id number]             Visit Date: 08/30/2023  Procedures: Visit Diagnoses:  1. Chronic right shoulder pain   2. Chronic bilateral low back pain without sciatica     Large Joint Inj: R glenohumeral on 08/30/2023 12:13 PM Indications: pain Details: 22 G 1.5 in needle, ultrasound-guided anterior approach  Arthrogram: No  Medications: 4 mL lidocaine 1 %; 80 mg triamcinolone acetonide 40 MG/ML Outcome: tolerated well, no immediate complications Procedure, treatment alternatives, risks and benefits explained, specific risks discussed. Consent was given by the patient. Immediately prior to procedure a time out was called to verify the correct patient, procedure, equipment, support staff and site/side marked as required. Patient was prepped and draped in the usual sterile fashion.         I personally saw and evaluated the patient, and participated in the  management and treatment plan.  Huel Cote, MD Attending Physician, Orthopedic Surgery  This document was dictated using Dragon voice recognition software. A reasonable attempt at proof reading has been made to minimize errors.

## 2023-09-04 ENCOUNTER — Ambulatory Visit: Payer: Self-pay

## 2023-09-05 NOTE — Patient Outreach (Signed)
Care Coordination   Follow Up Visit Note   09/04/2023 Name: Edward MCGRANE Sr. MRN: [id number] DOB: [birth year]  Edward Baltimore Sr. is a 52 y.o. year old male who sees Hoy Register, MD for primary care. I spoke with  Edward Baltimore Sr. by phone today.  What matters to the patients health and wellness today?  Patient would like to continue to manage his chronic health conditions and stay healthy.     Goals Addressed             This Visit's Progress    To get a second opinion for cause of stomach pain   On track    Care Coordination Interventions: Evaluation of current treatment plan related to persistent abdominal pain and patient's adherence to plan as established by provider Reviewed and discussed with patient PCP referral for Gastroenterologist for Colonoscopy Educated patient about referral process and next steps     To lower A1c <7.0%   On track    Care Coordination Interventions: Provided education to patient about basic DM disease process Reviewed medications with patient and discussed importance of medication adherence Counseled on importance of regular laboratory monitoring as prescribed Advised patient, providing education and rationale, to check cbg daily before meals and at bedtime and record, calling PCP for findings outside established parameters Review of patient status, including review of consultants reports, relevant laboratory and other test results, and medications completed Counseled on Diabetic diet, my plate method, 409 minutes of moderate intensity exercise/week Patient will continue to adhere to a diabetic friendly diet using the plate method and portion control Patient will implement a routine exercise regimen as directed by PT Patient will continue to take his medications exactly as prescribed Patient will continue to work with nurse care coordination for disease management and care coordination needs Lab Results  Component Value  Date   HGBA1C 6.1 08/29/2023      To work with PT for low back pain, ankle pain and knee pain   On track    Care Coordination Interventions: Determined patient established with pain management  Review of patient status, including review of consultant's reports, relevant laboratory and other test results, and medications completed Discussed importance of adherence to all scheduled medical appointments Counseled on the importance of reporting any/all new or changed pain symptoms or management strategies to pain management provider Advised patient to report to care team affect of pain on daily activities Discussed use of relaxation techniques and/or diversional activities to assist with pain reduction (distraction, imagery, relaxation, massage, acupressure, TENS, heat, and cold application Reviewed with patient prescribed pharmacological and nonpharmacological pain relief strategies    Interventions Today    Flowsheet Row Most Recent Value  Chronic Disease   Chronic disease during today's visit Diabetes, Other  [right shoulder pain,  abnormal thyroid]  General Interventions   General Interventions Discussed/Reviewed General Interventions Discussed, General Interventions Reviewed, Labs, Doctor Visits  Doctor Visits Discussed/Reviewed Doctor Visits Discussed, Doctor Visits Reviewed, Specialist, PCP  Education Interventions   Education Provided Provided Education  Provided Verbal Education On When to see the doctor, Medication, Exercise, Labs, Nutrition  Labs Reviewed Hgb A1c  Nutrition Interventions   Nutrition Discussed/Reviewed Nutrition Discussed, Nutrition Reviewed, Portion sizes  Pharmacy Interventions   Pharmacy Dicussed/Reviewed Pharmacy Topics Discussed, Pharmacy Topics Reviewed, Medications and their functions, Medication Adherence          SDOH assessments and interventions completed:  Yes  SDOH Interventions Today    Flowsheet Row Most Recent  Value  SDOH Interventions    Food Insecurity Interventions Intervention Not Indicated  Housing Interventions Intervention Not Indicated  Transportation Interventions Intervention Not Indicated  Utilities Interventions Intervention Not Indicated  Physical Activity Interventions [facility]        Care Coordination Interventions:  Yes, provided   Follow up plan: Follow up call scheduled for 12/04/23 @11 :00 AM    Encounter Outcome:  Patient Visit Completed

## 2023-09-05 NOTE — Patient Instructions (Signed)
Visit Information  Thank you for taking time to visit with me today. Please don't hesitate to contact me if I can be of assistance to you.   Following are the goals we discussed today:   Goals Addressed             This Visit's Progress    To get a second opinion for cause of stomach pain   On track    Care Coordination Interventions: Evaluation of current treatment plan related to persistent abdominal pain and patient's adherence to plan as established by provider Reviewed and discussed with patient PCP referral for Gastroenterologist for Colonoscopy Educated patient about referral process and next steps     To lower A1c <7.0%   On track    Care Coordination Interventions: Provided education to patient about basic DM disease process Reviewed medications with patient and discussed importance of medication adherence Counseled on importance of regular laboratory monitoring as prescribed Advised patient, providing education and rationale, to check cbg daily before meals and at bedtime and record, calling PCP for findings outside established parameters Review of patient status, including review of consultants reports, relevant laboratory and other test results, and medications completed Counseled on Diabetic diet, my plate method, 295 minutes of moderate intensity exercise/week Patient will continue to adhere to a diabetic friendly diet using the plate method and portion control Patient will implement a routine exercise regimen as directed by PT Patient will continue to take his medications exactly as prescribed Patient will continue to work with nurse care coordination for disease management and care coordination needs Lab Results  Component Value Date   HGBA1C 6.1 08/29/2023      To work with PT for low back pain, ankle pain and knee pain   On track    Care Coordination Interventions: Determined patient established with pain management  Review of patient status, including review of  consultant's reports, relevant laboratory and other test results, and medications completed Discussed importance of adherence to all scheduled medical appointments Counseled on the importance of reporting any/all new or changed pain symptoms or management strategies to pain management provider Advised patient to report to care team affect of pain on daily activities Discussed use of relaxation techniques and/or diversional activities to assist with pain reduction (distraction, imagery, relaxation, massage, acupressure, TENS, heat, and cold application Reviewed with patient prescribed pharmacological and nonpharmacological pain relief strategies        Our next appointment is by telephone on 12/04/23 at 11:00 AM  Please call the care guide team at [phone] if you need to cancel or reschedule your appointment.   If you are experiencing a Mental Health or Behavioral Health Crisis or need someone to talk to, please call 1-800-273-TALK (toll free, 24 hour hotline)  Patient verbalizes understanding of instructions and care plan provided today and agrees to view in MyChart. Active MyChart status and patient understanding of how to access instructions and care plan via MyChart confirmed with patient.     Delsa Sale RN BSN CCM [place]  [facility], [facility] Nurse Care Coordinator  Direct Dial: [phone] Website: [email or web address]

## 2023-09-12 DIAGNOSIS — N1831 Chronic kidney disease, stage 3a: Secondary | ICD-10-CM | POA: Diagnosis not present

## 2023-09-12 DIAGNOSIS — R748 Abnormal levels of other serum enzymes: Secondary | ICD-10-CM | POA: Diagnosis not present

## 2023-09-12 DIAGNOSIS — Z79899 Other long term (current) drug therapy: Secondary | ICD-10-CM | POA: Diagnosis not present

## 2023-09-12 DIAGNOSIS — E872 Acidosis, unspecified: Secondary | ICD-10-CM | POA: Diagnosis not present

## 2023-09-12 DIAGNOSIS — Z940 Kidney transplant status: Secondary | ICD-10-CM | POA: Diagnosis not present

## 2023-09-12 DIAGNOSIS — N189 Chronic kidney disease, unspecified: Secondary | ICD-10-CM | POA: Diagnosis not present

## 2023-09-12 DIAGNOSIS — E1129 Type 2 diabetes mellitus with other diabetic kidney complication: Secondary | ICD-10-CM | POA: Diagnosis not present

## 2023-09-12 DIAGNOSIS — N2581 Secondary hyperparathyroidism of renal origin: Secondary | ICD-10-CM | POA: Diagnosis not present

## 2023-09-12 DIAGNOSIS — I129 Hypertensive chronic kidney disease with stage 1 through stage 4 chronic kidney disease, or unspecified chronic kidney disease: Secondary | ICD-10-CM | POA: Diagnosis not present

## 2023-09-12 DIAGNOSIS — I770 Arteriovenous fistula, acquired: Secondary | ICD-10-CM | POA: Diagnosis not present

## 2023-09-12 DIAGNOSIS — D631 Anemia in chronic kidney disease: Secondary | ICD-10-CM | POA: Diagnosis not present

## 2023-09-14 ENCOUNTER — Encounter: Payer: Self-pay | Admitting: Registered Nurse

## 2023-09-14 LAB — LAB REPORT - SCANNED
A1c: 6.5
Creatinine, POC: 126.7 mg/dL
EGFR: 57
HM HIV Screening: NEGATIVE
HM Hepatitis Screen: NEGATIVE

## 2023-09-15 ENCOUNTER — Ambulatory Visit (HOSPITAL_BASED_OUTPATIENT_CLINIC_OR_DEPARTMENT_OTHER): Payer: 59 | Admitting: Orthopaedic Surgery

## 2023-09-20 ENCOUNTER — Ambulatory Visit
Admission: RE | Admit: 2023-09-20 | Discharge: 2023-09-20 | Disposition: A | Discharge status: Home / Self Care | Payer: 59 | Source: Ambulatory Visit | Attending: Orthopaedic Surgery

## 2023-09-20 DIAGNOSIS — M545 Low back pain, unspecified: Secondary | ICD-10-CM | POA: Diagnosis not present

## 2023-09-28 ENCOUNTER — Encounter (HOSPITAL_BASED_OUTPATIENT_CLINIC_OR_DEPARTMENT_OTHER): Payer: Self-pay | Admitting: Physical Therapy

## 2023-09-28 ENCOUNTER — Other Ambulatory Visit: Payer: Self-pay

## 2023-09-28 ENCOUNTER — Ambulatory Visit (HOSPITAL_BASED_OUTPATIENT_CLINIC_OR_DEPARTMENT_OTHER): Payer: 59 | Attending: Family Medicine | Admitting: Physical Therapy

## 2023-09-28 DIAGNOSIS — M5459 Other low back pain: Secondary | ICD-10-CM | POA: Insufficient documentation

## 2023-09-28 DIAGNOSIS — R2689 Other abnormalities of gait and mobility: Secondary | ICD-10-CM | POA: Insufficient documentation

## 2023-09-28 DIAGNOSIS — M6281 Muscle weakness (generalized): Secondary | ICD-10-CM | POA: Diagnosis not present

## 2023-09-28 DIAGNOSIS — M2550 Pain in unspecified joint: Secondary | ICD-10-CM | POA: Insufficient documentation

## 2023-09-28 NOTE — Therapy (Addendum)
 OUTPATIENT PHYSICAL THERAPY THORACOLUMBAR EVALUATION PHYSICAL THERAPY DISCHARGE SUMMARY  Visits from Start of Care: 1  Current functional level related to goals / functional outcomes: unknown   Remaining deficits: unknown   Education / Equipment: Initial edu   Patient agrees to discharge. Patient goals were not met. Patient is being discharged due to not returning since the last visit.  Addend Adriana Hopping Laneta Pintos) Viney Acocella MPT 02/21/24 11:05 AM [facility] [address] [place], [place], [address] Phone: [phone]   Fax:  [phone]    Patient Name: Edward CERRETA Sr. MRN: [id number] DOB:July 06, 1971, 52 y.o., male Today's Date: 09/28/2023  END OF SESSION:  PT End of Session - 09/28/23 1330     Visit Number 1    Date for PT Re-Evaluation 11/10/23    Authorization Type UHC/Mcd    PT Start Time 1115    PT Stop Time 1155    PT Time Calculation (min) 40 min    Activity Tolerance Patient tolerated treatment well    Behavior During Therapy WFL for tasks assessed/performed             Past Medical History:  Diagnosis Date   Acute on chronic renal failure (HCC) 11/25/2015   Anemia    Arthritis    Blind    Left eye   Cataract    CHF (congestive heart failure) (HCC)    Chronic kidney disease    stage 5   COVID-19    Depression    situatuional   Diabetes mellitus    Type II   GERD (gastroesophageal reflux disease)    Graves disease 2014   Headache    in past   Hx of adenomatous and sessile serrated colonic polyps 11/21/2019   Hyperlipidemia    Hypertension    Hypothyroidism    Legally blind    right eye   Neuropathy    Non-compliance    Proteinuria 05/24/2020   Shortness of breath dyspnea    "Better since I've been taking my medications"   Past Surgical History:  Procedure Laterality Date   AV FISTULA PLACEMENT Right 04/22/2019   Procedure: RIGHT ARM ARTERIOVENOUS (AV) FISTULA CREATION;  Surgeon:  Dannis Dy, MD;  Location: [facility] OR;  Service: Vascular;  Laterality: Right;   CIRCUMCISION     as a child, around 67 years of age   COLONOSCOPY     EYE SURGERY Bilateral    "several "   PARS PLANA VITRECTOMY Right 03/25/2016   Procedure: PARS PLANA VITRECTOMY 25 GAUGE FOR ENDOPHTHALMITIS;  Surgeon: Jearline Minder, MD;  Location: [facility] OR;  Service: Ophthalmology;  Laterality: Right;   WISDOM TOOTH EXTRACTION     Patient Active Problem List   Diagnosis Date Noted   Callus of toe 04/24/2023   Hallux valgus, acquired 04/24/2023   Renal transplant recipient 02/23/2023   ESRD (end stage renal disease) on dialysis [facility])    Hypertension associated with stage 5 chronic kidney disease due to type 2 diabetes mellitus (HCC) 05/24/2020   Hyperphosphatemia 05/24/2020   Proteinuria 05/24/2020   Legally blind 05/24/2020   Diabetic retinopathy of both eyes (HCC) 05/24/2020   Pneumonia due to COVID-19 virus 05/23/2020   Chronic diastolic heart failure (HCC) 10/24/2018   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   Mitral regurgitation, Moderate 09/05/2018   ED (erectile dysfunction) 08/10/2017   Chronic kidney disease, stage 5 (HCC) 07/18/2017   Graves disease 03/02/2016   Non compliance with medical treatment 11/26/2015   Uncontrolled hypertension 11/26/2015  Acute on chronic renal failure (HCC) 11/25/2015   DM (diabetes mellitus), 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    PCP: Joaquin Mulberry, MD   REFERRING PROVIDER: Joaquin Mulberry, MD   REFERRING DIAG: Arthralgia, unspecified joint   Rationale for Evaluation and Treatment: Rehabilitation  THERAPY DIAG:  Other low back pain  Muscle weakness (generalized)  Other abnormalities of gait and mobility  ONSET DATE: exacerbated last few months although been  SUBJECTIVE:                                                                                                                                                                                            SUBJECTIVE STATEMENT: LBP x 20 years.  Numbness in feet probably due to peripheral neuropathies. Left vision impairment due to DM. Pain in Lower part of back when it flares  I can barely move.  Nothing in particular starts.  Sometimes it is out for days and some weeks. "He does have known degenerative disc disease in the setting of a old injury in high school where he was temporarily paraplegic. 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 Had a shoulder injection from Dr Hermina Loosen a week ago with dx of right shoulder adhesive capsulitis with no reduction in pain.  PERTINENT HISTORY:   hypertension, type 2 diabetes mellitus (A1c 6.1), Graves' disease (status post radioactive iodine treatment in 07/2017), ESRD (status post renal transplant at [facility] on 01/06/2022), erectile dysfunction, previous bilateral lower extremity DVT (completed anticoagulation)   **as per Dr Hermina Loosen: likely have significant lumbar stenosis which may need to be addressed in the future    PAIN:  Are you having pain? Yes: NPRS scale: current 5/10; worst 8/10; least 2/10 Pain location: lb will radiate into calves at worst  Pain description: radiating; sharp ache, aggravating Aggravating factors: everything "when its on its on" Relieving factors: "suffers"  tylenol   PRECAUTIONS: Other: vision Impaired: Blind L eye  RED FLAGS: None   WEIGHT BEARING RESTRICTIONS: No  FALLS:  Has patient fallen in last 6 months? 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  DIAGNOSTIC FINDINGS:  None in chart yet  PATIENT SURVEYS:  LEFS  14/80  COGNITION: Overall cognitive status: Within functional limits for tasks assessed     SENSATION:  Paresthesia through bilat le to calf  MUSCLE LENGTH: Hamstrings: limited by 50% bilat   POSTURE: slumped  LUMBAR ROM:   AROM eval  Flexion FT to patella *P  Extension 50% limited *P  Right lateral flexion 25% limited  Left lateral flexion 25% limited  Right rotation   Left rotation    (Blank rows = not tested)  *P=Pain LOWER EXTREMITY STRENGTH:     strength Right eval Left eval  Hip flexion 9.1 12.6  Hip extension    Hip abduction 28.8 25.8  Hip adduction    Hip internal rotation    Hip external rotation    Knee flexion    Knee extension 23.5 17.3  Ankle dorsiflexion    Ankle plantarflexion    Ankle inversion    Ankle eversion     (Blank rows = not tested)  LOWER EXTREMITY ROM  WFL  LUMBAR SPECIAL TESTS:  Straight leg raise test: Positive bilaterally  FUNCTIONAL TESTS:  Timed up and go (TUG): 21.33 4 stage balance test: passed 1; semi tandem x 10s; tandem and SLS 0   30s STS: 2 GAIT: Distance walked: 500 Assistive device utilized: None Level of assistance: Complete Independence Comments: extended stance time bilat LE, significant lateral sway/side bending for advancement of le through hips  TREATMENT  Eval                                                                                                                              PATIENT EDUCATION:  Education details: Discussed eval findings, rehab rationale, aquatic program progression/POC and pools in area. Patient is in agreement  Person educated: Patient Education method: Explanation Education comprehension: verbalized understanding  HOME EXERCISE PROGRAM: TBA  ASSESSMENT:  CLINICAL IMPRESSION: Patient is a 52 y.o. m who was seen today for physical therapy evaluation and treatment for Arthralgia, unspecified joint . His main complaint is LBP and weakness with radiation into bilateral  le.  Pain and weakness vary in intensity R/L. Nothing identified flares it.  Once flared pt reports he is unable to move. He has significant muscle weakness as noted in above chart with functional testing suggesting high fall risk (4 stage balance, 30s STS and TUG).  He does work as a Financial risk analyst.  He has had an MRI of lumbar spine which at the time of this eval no results in chart. It is likely he has a significant lumbar spine compression which as per Dr Hermina Loosen may require further addressing going forward.  He will benefit from skilled PT.  Plan on aquatics for now as he may not tolerate land based intervention at this time.  Will re-asses in 6 weeks and add land intervetion once initial strengthening begins and clearer picture of dysfunction emerges.   OBJECTIVE IMPAIRMENTS: Abnormal gait, decreased activity tolerance, decreased balance, decreased endurance, decreased knowledge of condition, decreased knowledge of use of DME, decreased mobility, difficulty walking, decreased strength, decreased safety awareness, impaired perceived functional ability,  impaired sensation, impaired UE functional use, and postural dysfunction.   ACTIVITY LIMITATIONS: carrying, lifting, bending, sitting, standing, squatting, stairs, transfers, bed mobility, bathing, reach over head, locomotion level, and caring for others  PARTICIPATION LIMITATIONS: meal prep, cleaning, driving, shopping, community activity, occupation, and yard work  PERSONAL FACTORS: Past/current experiences, Time since onset of injury/illness/exacerbation, and 1-2 comorbidities: see Pmhx are also affecting patient's functional outcome.   REHAB POTENTIAL: Good  CLINICAL DECISION MAKING: Evolving/moderate complexity  EVALUATION COMPLEXITY: Moderate   GOALS: Goals reviewed with patient? No  SHORT TERM GOALS: Target date: 11/09/22  Pt will tolerate full aquatic sessions consistently without increase in pain and with improving function to demonstrate good  toleration and effectiveness of intervention.  Baseline: Goal status: INITIAL  2.  Pt will improve strength in all areas listed by up towards 10lb to demonstrate improved overall physical function Baseline: see chart Goal status: INITIAL  3.  Pt will report decrease in worst pain to </= 6/10 for improved toleration to activity/quality of life and to demonstrate improved management of pain. Baseline: 8/10 Goal status: INITIAL  4.  Pt will complete tandem stance in 3.6 ft holding x 20 Baseline: unable to complete land based Goal status: INITIAL  5.  Pt will improve on LEFS by 10 points to demonstrating improved perception of function Baseline: 14/80 Goal status: INITIAL  6.  Pt will re-asses and add land based intervention if approp to demonstrate overall improvement of pt toleration to activity Baseline:  Goal status: INITIAL  LONG TERM GOALS: Will set at re-cert as approp  PLAN:  PT FREQUENCY: 1-2x/week  PT DURATION: 6 weeks  PLANNED INTERVENTIONS: 97164- PT Re-evaluation, 97110-Therapeutic exercises, 97530- Therapeutic activity, 97112- Neuromuscular re-education, 97535- Self Care, 09811- Manual therapy, [id number]- Gait training, [phone]- Orthotic Fit/training, [phone]- Aquatic Therapy, 97014- Electrical stimulation (unattended), [phone]- Ionotophoresis 4mg /ml Dexamethasone , Patient/Family education, Balance training, Stair training, Taping, Dry Needling, Joint mobilization, Vestibular training, DME instructions, Cryotherapy, and Moist heat.  PLAN FOR NEXT SESSION: aquatic: le and core general strengthening; balance and gait training, toleration to activity   Adriana Hopping Laneta Pintos) Ozan Maclay MPT 09/28/23 1:33 PM [facility] [address] [place], [place], [address] Phone: [phone]   Fax:  [phone]   For all possible CPT codes, reference the Planned Interventions line above.     Check all conditions that are expected to impact  treatment: {Conditions expected to impact treatment:Diabetes mellitus, Musculoskeletal disorders, and Uncorrected hearing or vision impairment   If treatment provided at initial evaluation, no treatment charged due to lack of authorization.

## 2023-10-02 ENCOUNTER — Telehealth: Payer: Self-pay

## 2023-10-02 NOTE — Telephone Encounter (Signed)
Copied from CRM [id number]. Topic: General - Other >> Sep 29, 2023  4:52 PM Everette C wrote: Reason for CRM: The patient would like to be contacted by a member of practice staff when possible to discuss completion of transportation paperwork for them related to SCAT  Please contact the patent when possible

## 2023-10-03 NOTE — Telephone Encounter (Signed)
 Mr. Edward [name] is asking if Nurse Erskine Squibb B can call him back, he said he heard you but it may have been a bad connection.  He also mentioned that he wanted to drop off the scat application, so it can be filled out Please advise

## 2023-10-03 NOTE — Telephone Encounter (Signed)
 Call returned to patient, voicemail full, unable to leave a message

## 2023-10-03 NOTE — Telephone Encounter (Signed)
Pt is requesting SCAT application.

## 2023-10-05 NOTE — Telephone Encounter (Signed)
 I spoke to the patient and he said he completed Part A of [employer]  [place] application and he plans to drop it off at [facility].  I told him that I can complete Part B and will submit both parts to [employer] [place] after I receive his part. He stated he plans to drop it off tomorrow.

## 2023-10-11 ENCOUNTER — Encounter (HOSPITAL_BASED_OUTPATIENT_CLINIC_OR_DEPARTMENT_OTHER): Payer: Self-pay

## 2023-10-11 ENCOUNTER — Ambulatory Visit (HOSPITAL_BASED_OUTPATIENT_CLINIC_OR_DEPARTMENT_OTHER): Payer: 59 | Attending: Family Medicine | Admitting: Physical Therapy

## 2023-10-11 NOTE — Telephone Encounter (Signed)
 I called patient and informed him that I have not received Part A for the [employer] [place] application. He said he did not come to the clinic last week because of the weather and he will try to drop it off tomorrow.

## 2023-10-12 ENCOUNTER — Encounter: Payer: Self-pay | Admitting: Nephrology

## 2023-10-13 ENCOUNTER — Ambulatory Visit (HOSPITAL_BASED_OUTPATIENT_CLINIC_OR_DEPARTMENT_OTHER): Payer: 59 | Admitting: Orthopaedic Surgery

## 2023-10-26 ENCOUNTER — Encounter: Payer: Self-pay | Admitting: Podiatry

## 2023-10-26 ENCOUNTER — Ambulatory Visit (INDEPENDENT_AMBULATORY_CARE_PROVIDER_SITE_OTHER): Payer: 59 | Admitting: Podiatry

## 2023-10-26 DIAGNOSIS — B351 Tinea unguium: Secondary | ICD-10-CM | POA: Diagnosis not present

## 2023-10-26 DIAGNOSIS — L84 Corns and callosities: Secondary | ICD-10-CM

## 2023-10-26 DIAGNOSIS — E1142 Type 2 diabetes mellitus with diabetic polyneuropathy: Secondary | ICD-10-CM | POA: Diagnosis not present

## 2023-10-26 NOTE — Progress Notes (Signed)
This patient returns to my office for at risk foot care.  This patient requires this care by a professional since this patient will be at risk due to having ESRD, Blind,   and DM with polyneuropathy.  This patient is unable to cut nails himself since the patient cannot reach his nails.These nails are painful walking and wearing shoes.  Patient has kidney transplant.  Patient also has callus developing and occasional hurting  right big toe.This patient presents for at risk foot care today.  General Appearance  Alert, conversant and in no acute stress.  Vascular  Dorsalis pedis and posterior tibial  pulses are  weakly palpable  bilaterally.  Capillary return is within normal limits  bilaterally. Cold feet. bilaterally.  Neurologic  Senn-Weinstein monofilament wire test diminished  bilaterally. Muscle power within normal limits bilaterally.  Nails Thick disfigured discolored nails with subungual debris  from hallux to fifth toes bilaterally. No evidence of bacterial infection or drainage bilaterally. HAV  B/L.  Orthopedic  No limitations of motion  feet .  No crepitus or effusions noted.  No bony pathology or digital deformities noted.  HAV  B/L.  Numbness, shooting pain into his right big toe.  Skin  normotropic skin with no porokeratosis noted bilaterally.  No signs of infections or ulcers noted.  Callus distal aspect right hallux.   Onychomycosis  Pain in right toes  Pain in left toes  Consent was obtained for treatment procedures.   Mechanical debridement of nails 1-5  bilaterally performed with a nail nipper.  Filed with dremel without incident.    Return office visit  3 months                    Told patient to return for periodic foot care and evaluation due to potential at risk complications.   Helane Gunther DPM

## 2023-10-31 ENCOUNTER — Ambulatory Visit: Payer: Self-pay | Admitting: Family Medicine

## 2023-10-31 NOTE — Telephone Encounter (Signed)
Copied from CRM [id number]. Topic: Clinical - Red Word Triage >> Oct 31, 2023 12:28 PM Antony Haste wrote: Red Word that prompted transfer to Nurse Triage: experiencing weakness in his body, fatigue, body aching all over, and constant diarrhea.   Chief Complaint: Fatigue Symptoms: Fatigue, body aches, diarrhea, SOB with activity, nausea Pertinent Negatives: Patient denies vomiting Disposition: [] ED /[] Urgent Care (no appt availability in office) / [x] Appointment(In office/virtual)/ []  [facility]/ [] Home Care/ [] Refused Recommended Disposition /[] [facility]/ []  Follow-up with PCP Additional Notes: Patient stated he has been having fatigue and generalized weakness for a while now, but it has worsened in the past 2 weeks. Patient also experiencing nausea, diarrhea, and body aches. Appointment scheduled for 1/29   Reason for Disposition  [1] MODERATE weakness (i.e., interferes with work, school, normal activities) AND [2] persists > 3 days  Answer Assessment - Initial Assessment Questions 1. DESCRIPTION: "Describe how you are feeling."     Very fatigued, sleeping more than usual   2. SEVERITY: "How bad is it?"  "Can you stand and walk?"   - MILD (0-3): Feels weak or tired, but does not interfere with work, school or normal activities.   - MODERATE (4-7): Able to stand and walk; weakness interferes with work, school, or normal activities.   - SEVERE (8-10): Unable to stand or walk; unable to do usual activities.     Interfere with daily activity, balance is off while walking  3. ONSET: "When did these symptoms begin?" (e.g., hours, days, weeks, months)     Long time ago, but symptoms worsening over past 2 weeks  4. CAUSE: "What do you think is causing the weakness or fatigue?" (e.g., not drinking enough fluids, medical problem, trouble sleeping)     Unknown. Patient stated nothing has changed recently with his daily habits  5. OTHER SYMPTOMS: "Do you have any other  symptoms?" (e.g., chest pain, fever, cough, SOB, vomiting, diarrhea, bleeding, other areas of pain) SOB, diarrhea for 4-5 days (about 5 episodes of diarrhea today).  Protocols used: Weakness (Generalized) and Fatigue-A-AH

## 2023-10-31 NOTE — Telephone Encounter (Signed)
noted

## 2023-11-01 ENCOUNTER — Ambulatory Visit: Payer: 59 | Attending: Family Medicine | Admitting: Family Medicine

## 2023-11-01 ENCOUNTER — Encounter: Payer: Self-pay | Admitting: Family Medicine

## 2023-11-01 VITALS — BP 135/67 | HR 68 | Ht 72.0 in | Wt 201.8 lb

## 2023-11-01 DIAGNOSIS — R197 Diarrhea, unspecified: Secondary | ICD-10-CM

## 2023-11-01 DIAGNOSIS — N529 Male erectile dysfunction, unspecified: Secondary | ICD-10-CM | POA: Diagnosis not present

## 2023-11-01 DIAGNOSIS — R109 Unspecified abdominal pain: Secondary | ICD-10-CM | POA: Diagnosis not present

## 2023-11-01 LAB — POCT URINALYSIS DIP (CLINITEK)
Bilirubin, UA: NEGATIVE
Blood, UA: NEGATIVE
Glucose, UA: 500 mg/dL — AB
Ketones, POC UA: NEGATIVE mg/dL
Leukocytes, UA: NEGATIVE
Nitrite, UA: NEGATIVE
POC PROTEIN,UA: NEGATIVE
Spec Grav, UA: 1.015 (ref 1.010–1.025)
Urobilinogen, UA: 0.2 U/dL
pH, UA: 6 (ref 5.0–8.0)

## 2023-11-01 MED ORDER — SILDENAFIL CITRATE 100 MG PO TABS: 100.0000 mg | ORAL_TABLET | Freq: Every day | ORAL | 1 refills | Status: DC | PRN

## 2023-11-01 MED ORDER — DIPHENOXYLATE-ATROPINE 2.5-0.025 MG PO TABS: 1.0000 | ORAL_TABLET | Freq: Four times a day (QID) | ORAL | 0 refills | Status: AC | PRN

## 2023-11-01 MED ORDER — ACETAMINOPHEN 500 MG PO TABS: 500.0000 mg | ORAL_TABLET | Freq: Four times a day (QID) | ORAL | 1 refills | Status: DC | PRN

## 2023-11-01 MED ORDER — JANUVIA 25 MG PO TABS: 25.0000 mg | ORAL_TABLET | Freq: Every day | ORAL | 1 refills | Status: DC

## 2023-11-01 NOTE — Patient Instructions (Addendum)
[phone] option 1 [facility]  Sent Referral to [facility]   [address]   [place], [place] [address] PH# [phone]  Fax  [phone]

## 2023-11-01 NOTE — Progress Notes (Signed)
Subjective:  Patient ID: Edward Baltimore Sr., male    DOB: 07/12/1971  Age: 53 y.o. MRN: [id number]  CC: Diarrhea (Fatigue/dizziness)   HPI Edward VANA Sr. is a 53 y.o. year old male with a history of hypertension, type 2 diabetes mellitus (A1c 6.1), Graves' disease (status post radioactive iodine treatment in 07/2017), ESRD (status post renal transplant at [facility] on 01/06/2022), erectile dysfunction, previous bilateral lower extremity DVT (completed anticoagulation)   Interval History: Discussed the use of AI scribe software for clinical note transcription with the patient, who gave verbal consent to proceed.  He presents with ongoing abdominal pain, particularly around the area of the transplant incision, and diarrhea. The abdominal pain radiates to the right flank and has been described as sharp. The patient reports that these symptoms have worsened over the past couple of weeks. Despite taking dicyclomine for spasms, the patient reports no relief from the abdominal pain. The patient also reports having diarrhea up to six times a day or more, which has been ongoing but had a brief respite the night before the appointment.  In addition to the abdominal issues, the patient also reports general body pain, which has been constant and severe. The patient also reports episodes of dizziness and balance issues, which have resulted in a few falls. The patient notes that these episodes have been ongoing but have increased in frequency recently. The patient also reports a decrease in appetite, which he attributes to fear of exacerbating the diarrhea. Had referred him to GI for colonoscopy but the GI office was unsuccessful in reaching him. CT abdomen pelvis from 12/2022 revealed: IMPRESSION: 1. Normal appearing renal transplant within the pelvis on the right. 2. Findings likely consistent with a stable postoperative seroma or lymphocele along the lower pole of the previously noted  transplanted kidney. 3. Moderate to marked severity prostatomegaly. 4. Mild, stable urinary bladder wall thickening along the base of the bladder which may represent sequelae associated with chronic bladder outlet obstruction. 5. Small, stable anterior pericardial effusion. 6. Aortic atherosclerosis.   Aortic Atherosclerosis (ICD10-I70.0).      Past Medical History:  Diagnosis Date   Acute on chronic renal failure (HCC) 11/25/2015   Anemia    Arthritis    Blind    Left eye   Cataract    CHF (congestive heart failure) (HCC)    Chronic kidney disease    stage 5   COVID-19    Depression    situatuional   Diabetes mellitus    Type II   GERD (gastroesophageal reflux disease)    Graves disease 2014   Headache    in past   Hx of adenomatous and sessile serrated colonic polyps 11/21/2019   Hyperlipidemia    Hypertension    Hypothyroidism    Legally blind    right eye   Neuropathy    Non-compliance    Proteinuria 05/24/2020   Shortness of breath dyspnea    "Better since I've been taking my medications"    Past Surgical History:  Procedure Laterality Date   AV FISTULA PLACEMENT Right 04/22/2019   Procedure: RIGHT ARM ARTERIOVENOUS (AV) FISTULA CREATION;  Surgeon: Chuck Hint, MD;  Location: [facility] OR;  Service: Vascular;  Laterality: Right;   CIRCUMCISION     as a child, around 53 years of age   COLONOSCOPY     EYE SURGERY Bilateral    "several "   PARS PLANA VITRECTOMY Right 03/25/2016   Procedure: PARS PLANA VITRECTOMY  25 GAUGE FOR ENDOPHTHALMITIS;  Surgeon: Carmela Rima, MD;  Location: [facility] OR;  Service: Ophthalmology;  Laterality: Right;   WISDOM TOOTH EXTRACTION      Family History  Problem Relation Age of Onset   Hypertension Mother    Diabetes Mother    Bladder Cancer Brother    Kidney disease Other    Colon cancer Neg Hx    Rectal cancer Neg Hx    Esophageal cancer Neg Hx    Stomach cancer Neg Hx     Social History   Socioeconomic History    Marital status: Divorced    Spouse name: Not on file   Number of children: 11   Years of education: Not on file   Highest education level: Not on file  Occupational History   Occupation: disabled  Tobacco Use   Smoking status: Former    Current packs/day: 0.00    Average packs/day: 0.8 packs/day for 3.0 years (2.3 ttl pk-yrs)    Types: Cigarettes    Start date: 05/28/1997    Quit date: 05/28/2000    Years since quitting: 23.4   Smokeless tobacco: Never  Vaping Use   Vaping status: Never Used  Substance and Sexual Activity   Alcohol use: Not Currently    Alcohol/week: 0.0 standard drinks of alcohol    Comment: 1 beer  ocassional   Drug use: Yes    Types: Marijuana    Comment: daily for pain   Sexual activity: Not on file  Other Topics Concern   Not on file  Social History Narrative   Divorced   11 children   82 yo son with him at home   Disabled chef - cafeterias, Cytogeneticist, [employer], etc      Former cigarette smoker, marijuana now, rare alcohol, other drugs   Social Drivers of Corporate investment banker Strain: Low Risk  (02/02/2023)   Overall Financial Resource Strain (CARDIA)    Difficulty of Paying Living Expenses: Not hard at all  Food Insecurity: No Food Insecurity (09/04/2023)   Hunger Vital Sign    Worried About Running Out of Food in the Last Year: Never true    Ran Out of Food in the Last Year: Never true  Transportation Needs: No Transportation Needs (09/05/2023)   PRAPARE - Administrator, Civil Service (Medical): No    Lack of Transportation (Non-Medical): No  Physical Activity: Insufficiently Active (09/05/2023)   Exercise Vital Sign    Days of Exercise per Week: 2 days    Minutes of Exercise per Session: 20 min  Stress: No Stress Concern Present (02/02/2023)   [employer] of Occupational Health - Occupational Stress Questionnaire    Feeling of Stress : Not at all  Social Connections: Not on file    No Known Allergies  Outpatient  Medications Prior to Visit  Medication Sig Dispense Refill   Accu-Chek Softclix Lancets lancets Use to check blood sugar once daily. E11.39 100 each 2   Alcohol Swabs (B-D SINGLE USE SWABS REGULAR) PADS Use as directed. 100 each 6   ammonium lactate (AMLACTIN DAILY) 12 % lotion Apply 1 Application topically as needed for dry skin. 400 g 1   atorvastatin (LIPITOR) 40 MG tablet Take 1 tablet (40 mg total) by mouth daily. 90 tablet 1   Blood Glucose Calibration (MYGLUCOHEALTH CONTROL) SOLN      Blood Glucose Monitoring Suppl (ACCU-CHEK GUIDE) w/Device KIT Use to check blood sugar once daily. E11.39 1 kit 0  carvedilol (COREG) 12.5 MG tablet Take 12.5 mg by mouth 2 (two) times daily with a meal.     diclofenac Sodium (VOLTAREN) 1 % GEL Apply 4 g topically 4 (four) times daily. 100 g 1   dicyclomine (BENTYL) 20 MG tablet Take 1 tablet (20 mg total) by mouth 2 (two) times daily as needed for spasms. 60 tablet 2   DULoxetine (CYMBALTA) 60 MG capsule Take 1 capsule (60 mg total) by mouth daily. For chronic pain 90 capsule 1   furosemide (LASIX) 80 MG tablet Take 1 tablet (80 mg total) by mouth 2 (two) times daily. On the days you DO NOT have dialysis 34 tablet 2   glipiZIDE (GLUCOTROL XL) 5 MG 24 hr tablet Take 1 tablet by mouth daily.     glucose blood (ACCU-CHEK GUIDE) test strip Use to check blood sugar once daily. E11.39 100 each 2   levothyroxine (SYNTHROID) 150 MCG tablet Take 1 tablet (150 mcg total) by mouth daily before breakfast. 90 tablet 1   metoCLOPramide (REGLAN) 10 MG tablet Take 1 tablet (10 mg total) by mouth every 6 (six) hours as needed for nausea (nausea/headache). 10 tablet 0   mycophenolate (MYFORTIC) 180 MG EC tablet Take 180 mg by mouth 2 (two) times daily.     oxyCODONE (ROXICODONE) 5 MG immediate release tablet Take 1 tablet (5 mg total) by mouth every 4 (four) hours as needed for severe pain. 12 tablet 0   pantoprazole (PROTONIX) 40 MG tablet Take 1 tablet (40 mg total) by  mouth daily. 30 tablet 2   polyethylene glycol powder (GLYCOLAX/MIRALAX) 17 GM/SCOOP powder Take as directed as needed for constipation 850 g 1   sulfamethoxazole-trimethoprim (BACTRIM) 400-80 MG tablet Take 1 tablet by mouth 3 (three) times a week.     tacrolimus (PROGRAF) 1 MG capsule Take 1 mg by mouth 2 (two) times daily.     acetaminophen (TYLENOL) 500 MG tablet Take 1 tablet (500 mg total) by mouth every 6 (six) hours as needed for moderate pain or mild pain. 100 tablet 1   JANUVIA 25 MG tablet Take 1 tablet by mouth daily.     sildenafil (VIAGRA) 100 MG tablet Take 1 tablet (100 mg total) by mouth daily as needed for erectile dysfunction. 10 tablet 1   ciclopirox (PENLAC) 8 % solution Apply topically at bedtime. Apply over nail and surrounding skin. Apply daily over previous coat. After seven (7) days, may remove with alcohol and continue cycle. (Patient not taking: Reported on 11/01/2023) 6.6 mL 0   cinacalcet (SENSIPAR) 90 MG tablet Take 90 mg by mouth daily. (Patient not taking: Reported on 11/01/2023)     IRON SUCROSE IV Iron Sucrose (Venofer)     ondansetron (ZOFRAN-ODT) 4 MG disintegrating tablet Take 1 tablet (4 mg total) by mouth every 8 (eight) hours as needed for nausea or vomiting. (Patient not taking: Reported on 11/01/2023) 20 tablet 0   potassium chloride SA (KLOR-CON M) 20 MEQ tablet Take 2 tablets (40 mEq total) by mouth daily for 3 days. 6 tablet 0   pregabalin (LYRICA) 25 MG capsule Take 25 mg (1 capsule) in the morning and afternoon and 50 mg (2 capsules) at bedtime for 10 days. Then increase to 50 mg (2 capsules) in the morning, 25 mg (1 capsule) in the afternoon, and 50 mg (2 capsules) at bedtime for 10 days. Then increase to 50 mg (2 capsules) three times daily after that. (Patient not taking: Reported on 11/01/2023)  promethazine (PHENERGAN) 25 MG suppository Place 1 suppository (25 mg total) rectally every 6 (six) hours as needed for nausea or vomiting. (Patient not taking:  Reported on 11/01/2023) 12 each 0   promethazine (PHENERGAN) 25 MG tablet Take 1 tablet (25 mg total) by mouth every 6 (six) hours as needed for nausea or vomiting. (Patient not taking: Reported on 11/01/2023) 30 tablet 0   senna-docusate (SENOKOT-S) 8.6-50 MG tablet Take by mouth. (Patient not taking: Reported on 11/01/2023)     SENSIPAR 30 MG tablet Take 30 mg by mouth daily. (Patient not taking: Reported on 11/01/2023)     sevelamer carbonate (RENVELA) 800 MG tablet Take 1 tablet (800 mg total) by mouth 3 (three) times daily with meals. (Patient not taking: Reported on 11/01/2023) 90 tablet 0   VELPHORO 500 MG chewable tablet Chew 1,000 mg by mouth 3 (three) times daily. (Patient not taking: Reported on 11/01/2023)     No facility-administered medications prior to visit.     ROS Review of Systems  Constitutional:  Positive for fatigue. Negative for activity change and appetite change.  HENT:  Negative for sinus pressure and sore throat.   Respiratory:  Negative for chest tightness, shortness of breath and wheezing.   Cardiovascular:  Negative for chest pain and palpitations.  Gastrointestinal:  Positive for abdominal pain and diarrhea. Negative for abdominal distention and constipation.  Genitourinary: Negative.   Musculoskeletal: Negative.   Neurological:  Positive for dizziness.  Psychiatric/Behavioral:  Negative for behavioral problems and dysphoric mood.     Objective:  BP 135/67   Pulse 68   Ht 6' (1.829 m)   Wt 201 lb 12.8 oz (91.5 kg)   SpO2 100%   BMI 27.37 kg/m      11/01/2023   10:19 AM 08/29/2023    8:52 AM 08/24/2023    2:46 PM  BP/Weight  Systolic BP 135 147 143  Diastolic BP 67 74 80  Wt. (Lbs) 201.8 210 213  BMI 27.37 kg/m2 28.48 kg/m2 28.89 kg/m2      Physical Exam Constitutional:      Appearance: He is well-developed.  Cardiovascular:     Rate and Rhythm: Normal rate.     Heart sounds: Normal heart sounds. No murmur heard. Pulmonary:     Effort:  Pulmonary effort is normal.     Breath sounds: Normal breath sounds. No wheezing or rales.  Chest:     Chest wall: No tenderness.  Abdominal:     General: Bowel sounds are normal. There is no distension.     Palpations: Abdomen is soft. There is no mass.     Tenderness: There is abdominal tenderness (R side of abdomen). There is right CVA tenderness.  Musculoskeletal:        General: Normal range of motion.     Right lower leg: No edema.     Left lower leg: No edema.  Neurological:     Mental Status: He is alert and oriented to person, place, and time.  Psychiatric:        Mood and Affect: Mood normal.        Latest Ref Rng & Units 12/23/2022    9:47 PM 12/19/2022    3:12 PM 03/20/2022    9:31 PM  CMP  Glucose 70 - 99 mg/dL 045  409  811   BUN 6 - 20 mg/dL 40  19  30   Creatinine 0.61 - 1.24 mg/dL 9.14  7.82  9.56   Sodium 135 -  145 mmol/L 129  136  138   Potassium 3.5 - 5.1 mmol/L 3.6  4.3  3.1   Chloride 98 - 111 mmol/L 97  108  103   CO2 22 - 32 mmol/L 21  21  21    Calcium 8.9 - 10.3 mg/dL 8.9  9.6  9.3   Total Protein 6.5 - 8.1 g/dL 7.5  8.8  7.9   Total Bilirubin 0.3 - 1.2 mg/dL 1.8  1.3  1.5   Alkaline Phos 38 - 126 U/L 78  108  95   AST 15 - 41 U/L 27  27  21    ALT 0 - 44 U/L 23  30  18      Lipid Panel     Component Value Date/Time   CHOL 137 10/21/2019 0854   TRIG 116 10/21/2019 0854   HDL 55 10/21/2019 0854   CHOLHDL 2.5 10/21/2019 0854   CHOLHDL 2.8 09/19/2016 1023   VLDL 37 (H) 09/19/2016 1023   LDLCALC 61 10/21/2019 0854    CBC    Component Value Date/Time   WBC 8.0 12/23/2022 2147   RBC 4.68 12/23/2022 2147   HGB 15.1 12/23/2022 2147   HCT 42.0 12/23/2022 2147   PLT 223 12/23/2022 2147   MCV 89.7 12/23/2022 2147   MCH 32.3 12/23/2022 2147   MCHC 36.0 12/23/2022 2147   RDW 12.7 12/23/2022 2147   LYMPHSABS 0.9 12/23/2022 2147   MONOABS 1.0 12/23/2022 2147   EOSABS 0.0 12/23/2022 2147   BASOSABS 0.0 12/23/2022 2147    Lab Results   Component Value Date   HGBA1C 6.1 08/29/2023    Assessment & Plan:      Abdominal Pain and Diarrhea Patient reports abdominal pain and diarrhea, with pain localized to the right abdomen and flank. The pain is associated with a history of kidney transplant. The patient has been taking dicyclomine without relief. Recent CT scan does not show any significant findings. -Chart reveals he has been seen by [facility] for this chronic abdominal pain -Order stool tests to check for infection and inflammatory bowel disease. -Refer patient back to transplant team for further evaluation.  Advised to call the transplant team especially since he has right flank pain and right abdominal pain and pain is in the location transplanted kidney -Refer patient to GI. -Order blood tests to rule out infection. -Pending on lab results we will consider repeat CT  Chronic Back Pain Patient reports chronic back pain, which has been previously evaluated with MRI. -UA negative for UTI -Continue current pain management plan.  General Health Maintenance -Encourage patient to follow up with eye doctor for vision concerns. -Refill Januvia 25mg  and Glipizide 2.5mg  for diabetes management.  Erectile dysfunction -Refill Viagra as requested by patient.          Meds ordered this encounter  Medications   diphenoxylate-atropine (LOMOTIL) 2.5-0.025 MG tablet    Sig: Take 1 tablet by mouth 4 (four) times daily as needed for diarrhea or loose stools.    Dispense:  30 tablet    Refill:  0   acetaminophen (TYLENOL) 500 MG tablet    Sig: Take 1 tablet (500 mg total) by mouth every 6 (six) hours as needed for moderate pain (pain score 4-6) or mild pain (pain score 1-3).    Dispense:  100 tablet    Refill:  1   JANUVIA 25 MG tablet    Sig: Take 1 tablet (25 mg total) by mouth daily.  Dispense:  90 tablet    Refill:  1   sildenafil (VIAGRA) 100 MG tablet    Sig: Take 1 tablet (100 mg total) by mouth daily  as needed for erectile dysfunction.    Dispense:  10 tablet    Refill:  1    Ar least 24 hours between doses    Follow-up: Return for previously scheduled appointment.       Hoy Register, MD, FAAFP. [facility] [address] [facility] [place], [place] [phone]   11/01/2023, 11:17 AM

## 2023-11-01 NOTE — Telephone Encounter (Signed)
I met with the patient when he was in the clinic today and he had Part A of the [employer] [place] application completed.  I then emailed the entire application to [employer]

## 2023-11-02 ENCOUNTER — Other Ambulatory Visit: Payer: Self-pay | Admitting: Family Medicine

## 2023-11-02 DIAGNOSIS — E1139 Type 2 diabetes mellitus with other diabetic ophthalmic complication: Secondary | ICD-10-CM

## 2023-11-02 LAB — CBC WITH DIFFERENTIAL/PLATELET
Basophils Absolute: 0 10*3/uL (ref 0.0–0.2)
Basos: 0 %
EOS (ABSOLUTE): 0 10*3/uL (ref 0.0–0.4)
Eos: 0 %
Hematocrit: 37.4 % — ABNORMAL LOW (ref 37.5–51.0)
Hemoglobin: 12.8 g/dL — ABNORMAL LOW (ref 13.0–17.7)
Immature Grans (Abs): 0.1 10*3/uL (ref 0.0–0.1)
Immature Granulocytes: 1 %
Lymphocytes Absolute: 0.9 10*3/uL (ref 0.7–3.1)
Lymphs: 15 %
MCH: 32.5 pg (ref 26.6–33.0)
MCHC: 34.2 g/dL (ref 31.5–35.7)
MCV: 95 fL (ref 79–97)
Monocytes Absolute: 0.6 10*3/uL (ref 0.1–0.9)
Monocytes: 9 %
Neutrophils Absolute: 4.8 10*3/uL (ref 1.4–7.0)
Neutrophils: 75 %
Platelets: 166 10*3/uL (ref 150–450)
RBC: 3.94 x10E6/uL — ABNORMAL LOW (ref 4.14–5.80)
RDW: 13 % (ref 11.6–15.4)
WBC: 6.4 10*3/uL (ref 3.4–10.8)

## 2023-11-02 LAB — CMP14+EGFR
ALT: 51 [IU]/L — ABNORMAL HIGH (ref 0–44)
AST: 24 [IU]/L (ref 0–40)
Albumin: 4.1 g/dL (ref 3.8–4.9)
Alkaline Phosphatase: 172 [IU]/L — ABNORMAL HIGH (ref 44–121)
BUN/Creatinine Ratio: 13 (ref 9–20)
BUN: 22 mg/dL (ref 6–24)
Bilirubin Total: 0.5 mg/dL (ref 0.0–1.2)
CO2: 18 mmol/L — ABNORMAL LOW (ref 20–29)
Calcium: 9.4 mg/dL (ref 8.7–10.2)
Chloride: 105 mmol/L (ref 96–106)
Creatinine, Ser: 1.68 mg/dL — ABNORMAL HIGH (ref 0.76–1.27)
Globulin, Total: 2.6 g/dL (ref 1.5–4.5)
Glucose: 478 mg/dL — ABNORMAL HIGH (ref 70–99)
Potassium: 4.9 mmol/L (ref 3.5–5.2)
Sodium: 136 mmol/L (ref 134–144)
Total Protein: 6.7 g/dL (ref 6.0–8.5)
eGFR: 49 mL/min/{1.73_m2} — ABNORMAL LOW (ref >59)

## 2023-11-03 ENCOUNTER — Other Ambulatory Visit: Payer: Self-pay | Admitting: Family Medicine

## 2023-11-03 ENCOUNTER — Encounter: Payer: Self-pay | Admitting: Family Medicine

## 2023-11-03 DIAGNOSIS — R7989 Other specified abnormal findings of blood chemistry: Secondary | ICD-10-CM

## 2023-11-03 MED ORDER — GLIPIZIDE ER 10 MG PO TB24: 10.0000 mg | ORAL_TABLET | Freq: Every day | ORAL | 3 refills | Status: DC

## 2023-11-04 LAB — HEMOGLOBIN A1C
Est. average glucose Bld gHb Est-mCnc: 243 mg/dL
Hgb A1c MFr Bld: 10.1 % — ABNORMAL HIGH (ref 4.8–5.6)

## 2023-11-04 LAB — SPECIMEN STATUS REPORT

## 2023-11-04 LAB — HEPATITIS B SURFACE ANTIGEN

## 2023-11-04 LAB — GAMMA GT: GGT: 21 IU/L (ref 0–65)

## 2023-11-04 LAB — HEPATITIS A ANTIBODY, TOTAL

## 2023-11-04 LAB — HEPATITIS A ANTIBODY, IGM

## 2023-11-04 LAB — HCV AB W REFLEX TO QUANT PCR

## 2023-11-04 LAB — HCV INTERPRETATION

## 2023-11-06 ENCOUNTER — Other Ambulatory Visit: Payer: Self-pay | Admitting: Nephrology

## 2023-11-06 DIAGNOSIS — N1831 Chronic kidney disease, stage 3a: Secondary | ICD-10-CM

## 2023-11-06 DIAGNOSIS — Z940 Kidney transplant status: Secondary | ICD-10-CM

## 2023-11-10 ENCOUNTER — Ambulatory Visit
Admission: RE | Admit: 2023-11-10 | Discharge: 2023-11-10 | Disposition: A | Discharge status: Home / Self Care | Payer: 59 | Source: Ambulatory Visit | Attending: Nephrology | Admitting: Nephrology

## 2023-11-10 ENCOUNTER — Other Ambulatory Visit: Payer: 59

## 2023-11-10 ENCOUNTER — Encounter (HOSPITAL_COMMUNITY): Payer: Self-pay

## 2023-11-10 DIAGNOSIS — Z940 Kidney transplant status: Secondary | ICD-10-CM

## 2023-11-10 DIAGNOSIS — N1831 Chronic kidney disease, stage 3a: Secondary | ICD-10-CM

## 2023-11-15 ENCOUNTER — Telehealth: Payer: Self-pay

## 2023-11-15 NOTE — Telephone Encounter (Signed)
Copied from CRM [id number]. Topic: General - Other >> Nov 15, 2023  3:20 PM Eunice Blase wrote: Reason for CRM: Received call from [facility] per [facility] ph: [phone] extension:[phone] needs GI doctor's information. Please call Kellie.

## 2023-11-16 NOTE — Telephone Encounter (Signed)
LVM for kelly to return my phone call.

## 2023-12-05 ENCOUNTER — Ambulatory Visit: Payer: 59 | Attending: Family Medicine

## 2023-12-05 ENCOUNTER — Encounter: Payer: Self-pay | Admitting: Family Medicine

## 2023-12-05 ENCOUNTER — Ambulatory Visit: Payer: 59 | Admitting: Family Medicine

## 2023-12-05 VITALS — Ht 72.0 in | Wt 207.0 lb

## 2023-12-05 VITALS — BP 155/74 | HR 68 | Ht 72.0 in | Wt 211.2 lb

## 2023-12-05 DIAGNOSIS — M5126 Other intervertebral disc displacement, lumbar region: Secondary | ICD-10-CM

## 2023-12-05 DIAGNOSIS — E785 Hyperlipidemia, unspecified: Secondary | ICD-10-CM

## 2023-12-05 DIAGNOSIS — M25561 Pain in right knee: Secondary | ICD-10-CM

## 2023-12-05 DIAGNOSIS — E119 Type 2 diabetes mellitus without complications: Secondary | ICD-10-CM | POA: Diagnosis not present

## 2023-12-05 DIAGNOSIS — Z0000 Encounter for general adult medical examination without abnormal findings: Secondary | ICD-10-CM

## 2023-12-05 DIAGNOSIS — G8929 Other chronic pain: Secondary | ICD-10-CM

## 2023-12-05 DIAGNOSIS — M25562 Pain in left knee: Secondary | ICD-10-CM

## 2023-12-05 DIAGNOSIS — Z5912 Inadequate housing utilities: Secondary | ICD-10-CM

## 2023-12-05 DIAGNOSIS — Z5941 Food insecurity: Secondary | ICD-10-CM

## 2023-12-05 DIAGNOSIS — Z1211 Encounter for screening for malignant neoplasm of colon: Secondary | ICD-10-CM

## 2023-12-05 DIAGNOSIS — R109 Unspecified abdominal pain: Secondary | ICD-10-CM | POA: Diagnosis not present

## 2023-12-05 DIAGNOSIS — M545 Low back pain, unspecified: Secondary | ICD-10-CM

## 2023-12-05 DIAGNOSIS — Z5982 Transportation insecurity: Secondary | ICD-10-CM

## 2023-12-05 DIAGNOSIS — I129 Hypertensive chronic kidney disease with stage 1 through stage 4 chronic kidney disease, or unspecified chronic kidney disease: Secondary | ICD-10-CM

## 2023-12-05 DIAGNOSIS — Z7984 Long term (current) use of oral hypoglycemic drugs: Secondary | ICD-10-CM | POA: Diagnosis not present

## 2023-12-05 DIAGNOSIS — K760 Fatty (change of) liver, not elsewhere classified: Secondary | ICD-10-CM

## 2023-12-05 DIAGNOSIS — I10 Essential (primary) hypertension: Secondary | ICD-10-CM

## 2023-12-05 DIAGNOSIS — E1169 Type 2 diabetes mellitus with other specified complication: Secondary | ICD-10-CM

## 2023-12-05 DIAGNOSIS — E890 Postprocedural hypothyroidism: Secondary | ICD-10-CM

## 2023-12-05 DIAGNOSIS — R197 Diarrhea, unspecified: Secondary | ICD-10-CM

## 2023-12-05 DIAGNOSIS — E1139 Type 2 diabetes mellitus with other diabetic ophthalmic complication: Secondary | ICD-10-CM

## 2023-12-05 DIAGNOSIS — E1122 Type 2 diabetes mellitus with diabetic chronic kidney disease: Secondary | ICD-10-CM

## 2023-12-05 MED ORDER — PIOGLITAZONE HCL 15 MG PO TABS: 15.0000 mg | ORAL_TABLET | Freq: Every day | ORAL | 1 refills | Status: DC

## 2023-12-05 MED ORDER — VALSARTAN 40 MG PO TABS
40.0000 mg | ORAL_TABLET | Freq: Every day | ORAL | 1 refills | Status: DC
Start: 2023-12-05 — End: 2024-03-06

## 2023-12-05 MED ORDER — PREGABALIN 25 MG PO CAPS: 25.0000 mg | ORAL_CAPSULE | Freq: Two times a day (BID) | ORAL | 1 refills | Status: DC

## 2023-12-05 MED ORDER — GLIPIZIDE ER 10 MG PO TB24: 10.0000 mg | ORAL_TABLET | Freq: Every day | ORAL | 1 refills | Status: DC

## 2023-12-05 NOTE — Progress Notes (Addendum)
 Subjective:  Patient ID: Edward Baltimore Sr., male    DOB: 1971/02/09  Age: 53 y.o. MRN: [id number]  CC: Medical Management of Chronic Issues (Abdominal pain/Back pain/Knee pain)     Discussed the use of AI scribe software for clinical note transcription with the patient, who gave verbal consent to proceed.  History of Present Illness The patient, with a history of hypertension, type 2 diabetes mellitus (A1c 6.1), Graves' disease (status post radioactive iodine treatment in 07/2017), ESRD (status post renal transplant at [facility] on 01/06/2022), erectile dysfunction, previous bilateral lower extremity DVT (completed anticoagulation) presents with uncontrolled diabetes, abdominal pain, diarrhea, back pain, and knee pain.  The patient's diabetes has worsened significantly, with an A1c of 10.1 in 10/2023, up from 6.1 in November 2024 The patient is currently on glipizide 10mg  and Januvia 25mg  for diabetes management. The patient reports occasional blood sugar readings of around 140, but has not checked in a few days.  The patient also complains of abdominal pain, primarily on the right side, and diarrhea. An ultrasound showed fatty deposits on the liver. The patient suspects constipation and bloating may be due to backup in the colon, but reports daily bowel movements, primarily diarrhea.  The patient also reports chronic back pain, primarily in the lower back, and knee pain. The pain in the back radiates from the right side of the abdomen to the middle of the back. The patient also reports weakness in the legs and frequent loss of balance. The patient has seen an orthopedic specialist for these issues, but has not found relief with the prescribed medications. His knees also hurt.  I reviewed notes from orthopedic, Dr. Shon Baton from 03/2023 where he had diagnosed findings of patellofemoral arthralgia. MRI lumbar spine from 10/2023 revealed lumbar spine spondylosis, no evidence of nerve root  impingement     Past Medical History:  Diagnosis Date   Acute on chronic renal failure (HCC) 11/25/2015   Anemia    Arthritis    Blind    Left eye   Cataract    CHF (congestive heart failure) (HCC)    Chronic kidney disease    stage 5   COVID-19    Depression    situatuional   Diabetes mellitus    Type II   GERD (gastroesophageal reflux disease)    Graves disease 2014   Headache    in past   Hx of adenomatous and sessile serrated colonic polyps 11/21/2019   Hyperlipidemia    Hypertension    Hypothyroidism    Legally blind    right eye   Neuropathy    Non-compliance    Proteinuria 05/24/2020   Shortness of breath dyspnea    "Better since I've been taking my medications"    Past Surgical History:  Procedure Laterality Date   AV FISTULA PLACEMENT Right 04/22/2019   Procedure: RIGHT ARM ARTERIOVENOUS (AV) FISTULA CREATION;  Surgeon: Chuck Hint, MD;  Location: [facility] OR;  Service: Vascular;  Laterality: Right;   CIRCUMCISION     as a child, around 4 years of age   COLONOSCOPY     EYE SURGERY Bilateral    "several "   PARS PLANA VITRECTOMY Right 03/25/2016   Procedure: PARS PLANA VITRECTOMY 25 GAUGE FOR ENDOPHTHALMITIS;  Surgeon: Carmela Rima, MD;  Location: [facility] OR;  Service: Ophthalmology;  Laterality: Right;   WISDOM TOOTH EXTRACTION      Family History  Problem Relation Age of Onset   Hypertension Mother  Diabetes Mother    Bladder Cancer Brother    Kidney disease Other    Colon cancer Neg Hx    Rectal cancer Neg Hx    Esophageal cancer Neg Hx    Stomach cancer Neg Hx     Social History   Socioeconomic History   Marital status: Divorced    Spouse name: Not on file   Number of children: 11   Years of education: Not on file   Highest education level: Not on file  Occupational History   Occupation: disabled  Tobacco Use   Smoking status: Former    Current packs/day: 0.00    Average packs/day: 0.8 packs/day for 3.0 years (2.3 ttl pk-yrs)     Types: Cigarettes    Start date: 05/28/1997    Quit date: 05/28/2000    Years since quitting: 23.5   Smokeless tobacco: Never  Vaping Use   Vaping status: Never Used  Substance and Sexual Activity   Alcohol use: Not Currently    Alcohol/week: 0.0 standard drinks of alcohol    Comment: 1 beer  ocassional   Drug use: Yes    Types: Marijuana    Comment: daily for pain   Sexual activity: Not on file  Other Topics Concern   Not on file  Social History Narrative   Divorced   11 children   45 yo son with him at home   Disabled chef - cafeterias, Cytogeneticist, [employer], etc      Former cigarette smoker, marijuana now, rare alcohol, other drugs   Social Drivers of Corporate investment banker Strain: High Risk (12/05/2023)   Overall Financial Resource Strain (CARDIA)    Difficulty of Paying Living Expenses: Very hard  Food Insecurity: Food Insecurity Present (12/05/2023)   Hunger Vital Sign    Worried About Running Out of Food in the Last Year: Sometimes true    Ran Out of Food in the Last Year: Sometimes true  Transportation Needs: Unmet Transportation Needs (12/05/2023)   PRAPARE - Administrator, Civil Service (Medical): Yes    Lack of Transportation (Non-Medical): Yes  Physical Activity: Insufficiently Active (12/05/2023)   Exercise Vital Sign    Days of Exercise per Week: 3 days    Minutes of Exercise per Session: 30 min  Stress: Stress Concern Present (12/05/2023)   [employer] of Occupational Health - Occupational Stress Questionnaire    Feeling of Stress : Rather much  Social Connections: Socially Isolated (12/05/2023)   Social Connection and Isolation Panel [NHANES]    Frequency of Communication with Friends and Family: More than three times a week    Frequency of Social Gatherings with Friends and Family: Once a week    Attends Religious Services: Never    Database administrator or Organizations: No    Attends Banker Meetings: Never    Marital Status:  Divorced    No Known Allergies  Outpatient Medications Prior to Visit  Medication Sig Dispense Refill   Accu-Chek Softclix Lancets lancets Use to check blood sugar once daily. E11.39 100 each 2   acetaminophen (TYLENOL) 500 MG tablet Take 1 tablet (500 mg total) by mouth every 6 (six) hours as needed for moderate pain (pain score 4-6) or mild pain (pain score 1-3). 100 tablet 1   Alcohol Swabs (B-D SINGLE USE SWABS REGULAR) PADS Use as directed. 100 each 6   ammonium lactate (AMLACTIN DAILY) 12 % lotion Apply 1 Application topically as needed for  dry skin. 400 g 1   atorvastatin (LIPITOR) 40 MG tablet Take 1 tablet (40 mg total) by mouth daily. 90 tablet 1   Blood Glucose Calibration (MYGLUCOHEALTH CONTROL) SOLN      Blood Glucose Monitoring Suppl (ACCU-CHEK GUIDE) w/Device KIT Use to check blood sugar once daily. E11.39 1 kit 0   carvedilol (COREG) 12.5 MG tablet Take 12.5 mg by mouth 2 (two) times daily with a meal.     diclofenac Sodium (VOLTAREN) 1 % GEL Apply 4 g topically 4 (four) times daily. 100 g 1   dicyclomine (BENTYL) 20 MG tablet Take 1 tablet (20 mg total) by mouth 2 (two) times daily as needed for spasms. 60 tablet 2   diphenoxylate-atropine (LOMOTIL) 2.5-0.025 MG tablet Take 1 tablet by mouth 4 (four) times daily as needed for diarrhea or loose stools. 30 tablet 0   DULoxetine (CYMBALTA) 60 MG capsule Take 1 capsule (60 mg total) by mouth daily. For chronic pain 90 capsule 1   furosemide (LASIX) 80 MG tablet Take 1 tablet (80 mg total) by mouth 2 (two) times daily. On the days you DO NOT have dialysis 34 tablet 2   glucose blood (ACCU-CHEK GUIDE) test strip Use to check blood sugar once daily. E11.39 100 each 2   levothyroxine (SYNTHROID) 150 MCG tablet Take 1 tablet (150 mcg total) by mouth daily before breakfast. 90 tablet 1   metoCLOPramide (REGLAN) 10 MG tablet Take 1 tablet (10 mg total) by mouth every 6 (six) hours as needed for nausea (nausea/headache). 10 tablet 0    mycophenolate (MYFORTIC) 180 MG EC tablet Take 180 mg by mouth 2 (two) times daily.     oxyCODONE (ROXICODONE) 5 MG immediate release tablet Take 1 tablet (5 mg total) by mouth every 4 (four) hours as needed for severe pain. 12 tablet 0   pantoprazole (PROTONIX) 40 MG tablet Take 1 tablet (40 mg total) by mouth daily. 30 tablet 2   polyethylene glycol powder (GLYCOLAX/MIRALAX) 17 GM/SCOOP powder Take as directed as needed for constipation 850 g 1   sildenafil (VIAGRA) 100 MG tablet Take 1 tablet (100 mg total) by mouth daily as needed for erectile dysfunction. 10 tablet 1   sulfamethoxazole-trimethoprim (BACTRIM) 400-80 MG tablet Take 1 tablet by mouth 3 (three) times a week.     tacrolimus (PROGRAF) 1 MG capsule Take 1 mg by mouth 2 (two) times daily.     glipiZIDE (GLUCOTROL XL) 10 MG 24 hr tablet Take 1 tablet (10 mg total) by mouth daily. 30 tablet 3   JANUVIA 25 MG tablet Take 1 tablet (25 mg total) by mouth daily. 90 tablet 1   IRON SUCROSE IV Iron Sucrose (Venofer)     potassium chloride SA (KLOR-CON M) 20 MEQ tablet Take 2 tablets (40 mEq total) by mouth daily for 3 days. 6 tablet 0   ciclopirox (PENLAC) 8 % solution Apply topically at bedtime. Apply over nail and surrounding skin. Apply daily over previous coat. After seven (7) days, may remove with alcohol and continue cycle. (Patient not taking: Reported on 12/05/2023) 6.6 mL 0   cinacalcet (SENSIPAR) 90 MG tablet Take 90 mg by mouth daily. (Patient not taking: Reported on 11/01/2023)     ondansetron (ZOFRAN-ODT) 4 MG disintegrating tablet Take 1 tablet (4 mg total) by mouth every 8 (eight) hours as needed for nausea or vomiting. (Patient not taking: Reported on 12/05/2023) 20 tablet 0   pregabalin (LYRICA) 25 MG capsule Take 25 mg (1 capsule)  in the morning and afternoon and 50 mg (2 capsules) at bedtime for 10 days. Then increase to 50 mg (2 capsules) in the morning, 25 mg (1 capsule) in the afternoon, and 50 mg (2 capsules) at bedtime for 10  days. Then increase to 50 mg (2 capsules) three times daily after that. (Patient not taking: Reported on 12/05/2023)     promethazine (PHENERGAN) 25 MG suppository Place 1 suppository (25 mg total) rectally every 6 (six) hours as needed for nausea or vomiting. (Patient not taking: Reported on 12/05/2023) 12 each 0   promethazine (PHENERGAN) 25 MG tablet Take 1 tablet (25 mg total) by mouth every 6 (six) hours as needed for nausea or vomiting. (Patient not taking: Reported on 11/01/2023) 30 tablet 0   senna-docusate (SENOKOT-S) 8.6-50 MG tablet Take by mouth. (Patient not taking: Reported on 12/05/2023)     SENSIPAR 30 MG tablet Take 30 mg by mouth daily. (Patient not taking: Reported on 11/01/2023)     sevelamer carbonate (RENVELA) 800 MG tablet Take 1 tablet (800 mg total) by mouth 3 (three) times daily with meals. (Patient not taking: Reported on 11/01/2023) 90 tablet 0   VELPHORO 500 MG chewable tablet Chew 1,000 mg by mouth 3 (three) times daily. (Patient not taking: Reported on 11/01/2023)     No facility-administered medications prior to visit.     ROS Review of Systems  Constitutional:  Negative for activity change and appetite change.  HENT:  Negative for sinus pressure and sore throat.   Respiratory:  Negative for chest tightness, shortness of breath and wheezing.   Cardiovascular:  Negative for chest pain and palpitations.  Gastrointestinal:  Negative for abdominal distention, abdominal pain and constipation.  Genitourinary: Negative.   Musculoskeletal:        See HPI  Psychiatric/Behavioral:  Negative for behavioral problems and dysphoric mood.     Objective:  BP (!) 155/74   Pulse 68   Ht 6' (1.829 m)   Wt 211 lb 3.2 oz (95.8 kg)   SpO2 99%   BMI 28.64 kg/m      12/05/2023   12:02 PM 12/05/2023    9:53 AM 11/01/2023   10:19 AM  BP/Weight  Systolic BP  155 841  Diastolic BP  74 67  Wt. (Lbs) 207 211.2 201.8  BMI 28.07 kg/m2 28.64 kg/m2 27.37 kg/m2      Physical  Exam Constitutional:      Appearance: He is well-developed.  Cardiovascular:     Rate and Rhythm: Normal rate.     Heart sounds: Normal heart sounds. No murmur heard. Pulmonary:     Effort: Pulmonary effort is normal.     Breath sounds: Normal breath sounds. No wheezing or rales.  Chest:     Chest wall: No tenderness.  Abdominal:     General: Bowel sounds are normal. There is no distension.     Palpations: Abdomen is soft. There is no mass.     Tenderness: There is abdominal tenderness (RUQ TTP).  Musculoskeletal:     Right lower leg: No edema.     Left lower leg: No edema.     Comments: Tenderness on flexion and extension of bilateral knees  Neurological:     Mental Status: He is alert and oriented to person, place, and time.  Psychiatric:        Mood and Affect: Mood normal.        Latest Ref Rng & Units 11/01/2023   11:38 AM 12/23/2022  9:47 PM 12/19/2022    3:12 PM  CMP  Glucose 70 - 99 mg/dL 119  147  829   BUN 6 - 24 mg/dL 22  40  19   Creatinine 0.76 - 1.27 mg/dL 5.62  1.30  8.65   Sodium 134 - 144 mmol/L 136  129  136   Potassium 3.5 - 5.2 mmol/L 4.9  3.6  4.3   Chloride 96 - 106 mmol/L 105  97  108   CO2 20 - 29 mmol/L 18  21  21    Calcium 8.7 - 10.2 mg/dL 9.4  8.9  9.6   Total Protein 6.0 - 8.5 g/dL 6.7  7.5  8.8   Total Bilirubin 0.0 - 1.2 mg/dL 0.5  1.8  1.3   Alkaline Phos 44 - 121 IU/L 172  78  108   AST 0 - 40 IU/L 24  27  27    ALT 0 - 44 IU/L 51  23  30     Lipid Panel     Component Value Date/Time   CHOL 137 10/21/2019 0854   TRIG 116 10/21/2019 0854   HDL 55 10/21/2019 0854   CHOLHDL 2.5 10/21/2019 0854   CHOLHDL 2.8 09/19/2016 1023   VLDL 37 (H) 09/19/2016 1023   LDLCALC 61 10/21/2019 0854    CBC    Component Value Date/Time   WBC 6.4 11/01/2023 1138   WBC 8.0 12/23/2022 2147   RBC 3.94 (L) 11/01/2023 1138   RBC 4.68 12/23/2022 2147   HGB 12.8 (L) 11/01/2023 1138   HCT 37.4 (L) 11/01/2023 1138   PLT 166 11/01/2023 1138   MCV 95  11/01/2023 1138   MCH 32.5 11/01/2023 1138   MCH 32.3 12/23/2022 2147   MCHC 34.2 11/01/2023 1138   MCHC 36.0 12/23/2022 2147   RDW 13.0 11/01/2023 1138   LYMPHSABS 0.9 11/01/2023 1138   MONOABS 1.0 12/23/2022 2147   EOSABS 0.0 11/01/2023 1138   BASOSABS 0.0 11/01/2023 1138    Lab Results  Component Value Date   HGBA1C 10.1 (H) 11/01/2023    Lab Results  Component Value Date   TSH 23.300 (H) 08/29/2023       Assessment & Plan Uncontrolled Type 2 Diabetes Mellitus A1c increased to 10.1 from 6.1. Patient reports inconsistent blood glucose monitoring with highest reading of 140. In-office blood glucose reading of 277. Currently on Glipizide 10mg  and Januvia 25mg . -Discontinue Januvia 25mg . -Initiate Actos 15mg  daily due to hepatic steatosis -Encourage consistent blood glucose monitoring. -Would love to refer him for the liberate study with clinical pharmacy team but he declines  Hypertension Elevated blood pressure readings despite Carvedilol 12.5mg  twice daily. -Add low dose Valsartan to current regimen. -Counseled on blood pressure goal of less than 130/80, low-sodium, DASH diet, medication compliance, 150 minutes of moderate intensity exercise per week. Discussed medication compliance, adverse effects.   Chronic Abdominal Pain Patient reports ongoing abdominal pain, bloating, and diarrhea. Ultrasound showed fatty deposits on the liver. Patient suspects constipation, but reports frequent diarrhea. -Refer to GI specialist for further evaluation. -Consider trial of MiraLAX for potential constipation.  Chronic Low Back Pain and Bilateral Knee Pain Patient reports ongoing low back and knee pain. Previous evaluation by orthopedic specialist suggested arthritis as cause of back pain and patellofemoral arthralgia for knee pain. Patient is not currently taking prescribed Lyrica. -Refill Lyrica prescription for pain management. -Refer back to orthopedic specialist for further  evaluation and potential treatment options. -Suggest over-the-counter Lidoderm patches for pain management.  Hepatic steatosis Identified on ultrasound. Patient currently has uncontrolled diabetes and is on Januvia 25mg . -Discontinue Januvia 25mg . -Initiate Actos 15mg  daily, which may also help with fatty liver.  Hyperlipidemia Noted on previous labs. Patient reports recent consumption of eggs. -Defer cholesterol check due to recent food intake. -Encourage dietary modifications to reduce cholesterol intake. -Continue statin      Meds ordered this encounter  Medications   pioglitazone (ACTOS) 15 MG tablet    Sig: Take 1 tablet (15 mg total) by mouth daily.    Dispense:  90 tablet    Refill:  1    Discontinue Januvia   pregabalin (LYRICA) 25 MG capsule    Sig: Take 1 capsule (25 mg total) by mouth 2 (two) times daily.    Dispense:  180 capsule    Refill:  1   glipiZIDE (GLUCOTROL XL) 10 MG 24 hr tablet    Sig: Take 1 tablet (10 mg total) by mouth daily.    Dispense:  90 tablet    Refill:  1   valsartan (DIOVAN) 40 MG tablet    Sig: Take 1 tablet (40 mg total) by mouth daily.    Dispense:  90 tablet    Refill:  1    Follow-up: Return in about 3 months (around 03/06/2024) for Chronic medical conditions.       Hoy Register, MD, FAAFP. [facility] [place], [place] [phone]   12/05/2023, 12:40 PM

## 2023-12-05 NOTE — Patient Instructions (Signed)
 Please call GI for your appointment: Placed in [facility] [address] [place], [place] [address] PH# [phone]

## 2023-12-05 NOTE — Patient Instructions (Addendum)
 Mr. Righi , Thank you for taking time to come for your Medicare Wellness Visit. I appreciate your ongoing commitment to your health goals. Please review the following plan we discussed and let me know if I can assist you in the future.   Referrals/Orders/Follow-Ups/Clinician Recommendations: Yes; Keep maintaining your health by keeping your appointments with Dr. Alvis Lemmings and any specialists that you may see.  Call us if you need anything.  Have a great year!!!!  This is a list of the screening recommended for you and due dates:  Health Maintenance  Topic Date Due   Eye exam for diabetics  09/30/2020   COVID-19 Vaccine (3 - Pfizer risk series) 04/28/2021   Colon Cancer Screening  11/12/2022   Flu Shot  01/01/2024*   Zoster (Shingles) Vaccine (1 of 2) 01/30/2024*   DTaP/Tdap/Td vaccine (2 - Td or Tdap) 10/31/2024*   Pneumococcal Vaccination (2 of 2 - PCV) 10/31/2024*   Hemoglobin A1C  04/30/2024   Complete foot exam   08/28/2024   Medicare Annual Wellness Visit  12/04/2024   Hepatitis C Screening  Completed   HIV Screening  Completed   HPV Vaccine  Aged Out  *Topic was postponed. The date shown is not the original due date.    Advanced directives: (Declined) Advance directive discussed with you today. Even though you declined this today, please call our office should you change your mind, and we can give you the proper paperwork for you to fill out.  Next Medicare Annual Wellness Visit scheduled for next year: Yes

## 2023-12-05 NOTE — Progress Notes (Signed)
 Subjective:   Edward LOESCHER Sr. is a 53 y.o. who presents for a Medicare Wellness preventive visit.  Visit Complete: Virtual I connected with  Edward Baltimore Sr. on 12/05/23 by a audio enabled telemedicine application and verified that I am speaking with the correct person using two identifiers.  Patient Location: Home  Provider Location: Office/Clinic  I discussed the limitations of evaluation and management by telemedicine. The patient expressed understanding and agreed to proceed.  Vital Signs: Because this visit was a virtual/telehealth visit, some criteria may be missing or patient reported. Any vitals not documented were not able to be obtained and vitals that have been documented are patient reported.  VideoDeclined- This patient declined Librarian, academic. Therefore the visit was completed with audio only.  AWV Questionnaire: No: Patient Medicare AWV questionnaire was not completed prior to this visit.  Cardiac Risk Factors include: advanced age (>14men, >63 women);diabetes mellitus;dyslipidemia;family history of premature cardiovascular disease;hypertension;male gender     Objective:    Today's Vitals   12/05/23 1202 12/05/23 1204  Weight: 207 lb (93.9 kg)   Height: 6' (1.829 m)   PainSc: 7  7   PainLoc: Generalized    Body mass index is 28.07 kg/m.     12/05/2023   12:05 PM 09/28/2023    1:22 PM 02/02/2023   12:09 PM 12/19/2022    2:52 PM 03/19/2022    6:31 AM 03/15/2022    7:26 PM 07/27/2021   10:53 AM  Advanced Directives  Does Patient Have a Medical Advance Directive? No No No No No No No  Would patient like information on creating a medical advance directive? No - Patient declined   No - Patient declined  No - Patient declined     Current Medications (verified) Outpatient Encounter Medications as of 12/05/2023  Medication Sig   Accu-Chek Softclix Lancets lancets Use to check blood sugar once daily. E11.39    acetaminophen (TYLENOL) 500 MG tablet Take 1 tablet (500 mg total) by mouth every 6 (six) hours as needed for moderate pain (pain score 4-6) or mild pain (pain score 1-3).   Alcohol Swabs (B-D SINGLE USE SWABS REGULAR) PADS Use as directed.   ammonium lactate (AMLACTIN DAILY) 12 % lotion Apply 1 Application topically as needed for dry skin.   atorvastatin (LIPITOR) 40 MG tablet Take 1 tablet (40 mg total) by mouth daily.   Blood Glucose Calibration (MYGLUCOHEALTH CONTROL) SOLN    Blood Glucose Monitoring Suppl (ACCU-CHEK GUIDE) w/Device KIT Use to check blood sugar once daily. E11.39   carvedilol (COREG) 12.5 MG tablet Take 12.5 mg by mouth 2 (two) times daily with a meal.   diclofenac Sodium (VOLTAREN) 1 % GEL Apply 4 g topically 4 (four) times daily.   dicyclomine (BENTYL) 20 MG tablet Take 1 tablet (20 mg total) by mouth 2 (two) times daily as needed for spasms.   diphenoxylate-atropine (LOMOTIL) 2.5-0.025 MG tablet Take 1 tablet by mouth 4 (four) times daily as needed for diarrhea or loose stools.   DULoxetine (CYMBALTA) 60 MG capsule Take 1 capsule (60 mg total) by mouth daily. For chronic pain   furosemide (LASIX) 80 MG tablet Take 1 tablet (80 mg total) by mouth 2 (two) times daily. On the days you DO NOT have dialysis   glipiZIDE (GLUCOTROL XL) 10 MG 24 hr tablet Take 1 tablet (10 mg total) by mouth daily.   glucose blood (ACCU-CHEK GUIDE) test strip Use to check blood sugar once daily.  E11.39   IRON SUCROSE IV Iron Sucrose (Venofer)   levothyroxine (SYNTHROID) 150 MCG tablet Take 1 tablet (150 mcg total) by mouth daily before breakfast.   metoCLOPramide (REGLAN) 10 MG tablet Take 1 tablet (10 mg total) by mouth every 6 (six) hours as needed for nausea (nausea/headache).   mycophenolate (MYFORTIC) 180 MG EC tablet Take 180 mg by mouth 2 (two) times daily.   oxyCODONE (ROXICODONE) 5 MG immediate release tablet Take 1 tablet (5 mg total) by mouth every 4 (four) hours as needed for severe  pain.   pantoprazole (PROTONIX) 40 MG tablet Take 1 tablet (40 mg total) by mouth daily.   pioglitazone (ACTOS) 15 MG tablet Take 1 tablet (15 mg total) by mouth daily.   polyethylene glycol powder (GLYCOLAX/MIRALAX) 17 GM/SCOOP powder Take as directed as needed for constipation   potassium chloride SA (KLOR-CON M) 20 MEQ tablet Take 2 tablets (40 mEq total) by mouth daily for 3 days.   pregabalin (LYRICA) 25 MG capsule Take 1 capsule (25 mg total) by mouth 2 (two) times daily.   sildenafil (VIAGRA) 100 MG tablet Take 1 tablet (100 mg total) by mouth daily as needed for erectile dysfunction.   sulfamethoxazole-trimethoprim (BACTRIM) 400-80 MG tablet Take 1 tablet by mouth 3 (three) times a week.   tacrolimus (PROGRAF) 1 MG capsule Take 1 mg by mouth 2 (two) times daily.   valsartan (DIOVAN) 40 MG tablet Take 1 tablet (40 mg total) by mouth daily.   No facility-administered encounter medications on file as of 12/05/2023.    Allergies (verified) Patient has no known allergies.   History: Past Medical History:  Diagnosis Date   Acute on chronic renal failure (HCC) 11/25/2015   Anemia    Arthritis    Blind    Left eye   Cataract    CHF (congestive heart failure) (HCC)    Chronic kidney disease    stage 5   COVID-19    Depression    situatuional   Diabetes mellitus    Type II   GERD (gastroesophageal reflux disease)    Graves disease 2014   Headache    in past   Hx of adenomatous and sessile serrated colonic polyps 11/21/2019   Hyperlipidemia    Hypertension    Hypothyroidism    Legally blind    right eye   Neuropathy    Non-compliance    Proteinuria 05/24/2020   Shortness of breath dyspnea    "Better since I've been taking my medications"   Past Surgical History:  Procedure Laterality Date   AV FISTULA PLACEMENT Right 04/22/2019   Procedure: RIGHT ARM ARTERIOVENOUS (AV) FISTULA CREATION;  Surgeon: Chuck Hint, MD;  Location: [facility] OR;  Service: Vascular;   Laterality: Right;   CIRCUMCISION     as a child, around 43 years of age   COLONOSCOPY     EYE SURGERY Bilateral    "several "   PARS PLANA VITRECTOMY Right 03/25/2016   Procedure: PARS PLANA VITRECTOMY 25 GAUGE FOR ENDOPHTHALMITIS;  Surgeon: Carmela Rima, MD;  Location: [facility] OR;  Service: Ophthalmology;  Laterality: Right;   WISDOM TOOTH EXTRACTION     Family History  Problem Relation Age of Onset   Hypertension Mother    Diabetes Mother    Bladder Cancer Brother    Kidney disease Other    Colon cancer Neg Hx    Rectal cancer Neg Hx    Esophageal cancer Neg Hx    Stomach cancer Neg  Hx    Social History   Socioeconomic History   Marital status: Divorced    Spouse name: Not on file   Number of children: 11   Years of education: Not on file   Highest education level: Not on file  Occupational History   Occupation: disabled  Tobacco Use   Smoking status: Former    Current packs/day: 0.00    Average packs/day: 0.8 packs/day for 3.0 years (2.3 ttl pk-yrs)    Types: Cigarettes    Start date: 05/28/1997    Quit date: 05/28/2000    Years since quitting: 23.5   Smokeless tobacco: Never  Vaping Use   Vaping status: Never Used  Substance and Sexual Activity   Alcohol use: Not Currently    Alcohol/week: 0.0 standard drinks of alcohol    Comment: 1 beer  ocassional   Drug use: Yes    Types: Marijuana    Comment: daily for pain   Sexual activity: Not on file  Other Topics Concern   Not on file  Social History Narrative   Divorced   27 children   28 yo son with him at home   Disabled chef - cafeterias, Cytogeneticist, [employer], etc      Former cigarette smoker, marijuana now, rare alcohol, other drugs   Social Drivers of Corporate investment banker Strain: High Risk (12/05/2023)   Overall Financial Resource Strain (CARDIA)    Difficulty of Paying Living Expenses: Very hard  Food Insecurity: Food Insecurity Present (12/05/2023)   Hunger Vital Sign    Worried About Running Out of  Food in the Last Year: Sometimes true    Ran Out of Food in the Last Year: Sometimes true  Transportation Needs: Unmet Transportation Needs (12/05/2023)   PRAPARE - Administrator, Civil Service (Medical): Yes    Lack of Transportation (Non-Medical): Yes  Physical Activity: Insufficiently Active (12/05/2023)   Exercise Vital Sign    Days of Exercise per Week: 3 days    Minutes of Exercise per Session: 30 min  Stress: Stress Concern Present (12/05/2023)   [employer] of Occupational Health - Occupational Stress Questionnaire    Feeling of Stress : Rather much  Social Connections: Socially Isolated (12/05/2023)   Social Connection and Isolation Panel [NHANES]    Frequency of Communication with Friends and Family: More than three times a week    Frequency of Social Gatherings with Friends and Family: Once a week    Attends Religious Services: Never    Database administrator or Organizations: No    Attends Engineer, structural: Never    Marital Status: Divorced    Tobacco Counseling Counseling given: Not Answered    Clinical Intake:  Pre-visit preparation completed: Yes  Pain : 0-10 Pain Score: 7  Pain Type: Chronic pain Pain Location: Generalized Pain Orientation: Other (Comment) (all over his body) Pain Onset: More than a month ago Pain Frequency: Intermittent Pain Relieving Factors: Oxycodone  Pain Relieving Factors: Oxycodone  BMI - recorded: 28.07 Nutritional Status: BMI 25 -29 Overweight Nutritional Risks: None Diabetes: Yes CBG done?: No Did pt. bring in CBG monitor from home?: No  How often do you need to have someone help you when you read instructions, pamphlets, or other written materials from your doctor or pharmacy?: 1 - Never What is the last grade level you completed in school?: HSG  Interpreter Needed?: No  Information entered by :: Susie Cassette, LPN.   Activities of Daily  Living     12/05/2023   12:08 PM 02/02/2023   12:10  PM  In your present state of health, do you have any difficulty performing the following activities:  Hearing? 0 0  Vision? 1 1  Comment  legally blind  Difficulty concentrating or making decisions? 1 1  Walking or climbing stairs? 0 1  Dressing or bathing? 0 0  Doing errands, shopping? 0 0  Preparing Food and eating ? N N  Using the Toilet? N N  In the past six months, have you accidently leaked urine? N N  Do you have problems with loss of bowel control? N N  Managing your Medications? N N  Managing your Finances? N N  Housekeeping or managing your Housekeeping? N N    Patient Care Team: Hoy Register, MD as PCP - General (Family Medicine) Jodelle Red, MD as PCP - Cardiology (Cardiology) Clarene Duke, Karma Lew, RN as [facility] Care Management  Indicate any recent Medical Services you may have received from other than [facility] providers in the past year (date may be approximate).     Assessment:   This is a routine wellness examination for Philipp.  Hearing/Vision screen Hearing Screening - Comments:: Denies hearing difficulties.   Vision Screening - Comments:: TOTALLY BLIND IN THE LEFT EYE AND LEGALLY BLIND IN THE RIGHT EYE - Not up to date with routine eye exams.    Goals Addressed             This Visit's Progress    To stay alive.         Depression Screen     12/05/2023   12:13 PM 12/05/2023    9:54 AM 11/01/2023   10:22 AM 02/23/2023   11:00 AM 02/02/2023   12:10 PM 12/02/2021   10:25 AM 07/27/2021   10:53 AM  PHQ 2/9 Scores  PHQ - 2 Score 2 2 3  0 0 2 3  PHQ- 9 Score 10 10 14 2  14 12     Fall Risk     12/05/2023   12:06 PM 12/05/2023    9:54 AM 11/01/2023   10:22 AM 08/29/2023    8:55 AM 02/23/2023   11:00 AM  Fall Risk   Falls in the past year? 1 1 1  0 1  Number falls in past yr: 1 1 1  0 0  Injury with Fall? 1 0 0 0 0  Risk for fall due to : History of fall(s);Impaired balance/gait;Orthopedic patient History of fall(s) No Fall Risks No  Fall Risks History of fall(s)  Follow up Falls prevention discussed;Falls evaluation completed Falls evaluation completed Falls evaluation completed Falls evaluation completed     MEDICARE RISK AT HOME:  Medicare Risk at Home Any stairs in or around the home?: Yes (front porch) If so, are there any without handrails?: No Home free of loose throw rugs in walkways, pet beds, electrical cords, etc?: Yes Adequate lighting in your home to reduce risk of falls?: Yes Life alert?: No Use of a cane, walker or w/c?: No Grab bars in the bathroom?: No Shower chair or bench in shower?: No Elevated toilet seat or a handicapped toilet?: Yes  TIMED UP AND GO:  Was the test performed?  No  Cognitive Function: 6CIT completed    12/05/2023   12:08 PM  MMSE - Mini Mental State Exam  Not completed: Unable to complete        12/05/2023   12:08 PM 02/02/2023   12:13 PM  6CIT Screen  What Year? 0 points 0 points  What month? 0 points 0 points  What time? 0 points 0 points  Count back from 20 0 points 0 points  Months in reverse 0 points 0 points  Repeat phrase 4 points 2 points  Total Score 4 points 2 points    Immunizations Immunization History  Administered Date(s) Administered   Influenza,inj,Quad PF,6+ Mos 09/19/2016   Influenza-Unspecified 06/03/2020   PFIZER(Purple Top)SARS-COV-2 Vaccination 07/10/2020   Pneumococcal Polysaccharide-23 09/19/2016   Tdap 02/20/2012    Screening Tests Health Maintenance  Topic Date Due   OPHTHALMOLOGY EXAM  09/30/2020   COVID-19 Vaccine (3 - Pfizer risk series) 04/28/2021   Colonoscopy  11/12/2022   INFLUENZA VACCINE  01/01/2024 (Originally 05/04/2023)   Zoster Vaccines- Shingrix (1 of 2) 01/30/2024 (Originally 02/13/1990)   DTaP/Tdap/Td (2 - Td or Tdap) 10/31/2024 (Originally 02/19/2022)   Pneumococcal Vaccine 25-87 Years old (2 of 2 - PCV) 10/31/2024 (Originally 09/19/2017)   HEMOGLOBIN A1C  04/30/2024   FOOT EXAM  08/28/2024   Medicare Annual  Wellness (AWV)  12/04/2024   Hepatitis C Screening  Completed   HIV Screening  Completed   HPV VACCINES  Aged Out    Health Maintenance  Health Maintenance Due  Topic Date Due   OPHTHALMOLOGY EXAM  09/30/2020   COVID-19 Vaccine (3 - Pfizer risk series) 04/28/2021   Colonoscopy  11/12/2022   Health Maintenance Items Addressed: Referral sent to GI for colonoscopy, Referral sent to Optometry/Ophthalmology  Additional Screening:  Vision Screening: Recommended annual ophthalmology exams for early detection of glaucoma and other disorders of the eye.  Dental Screening: Recommended annual dental exams for proper oral hygiene  Community Resource Referral / Chronic Care Management: CRR required this visit?  No   CCM required this visit?  PCP informed of CCM need     Plan:     I have personally reviewed and noted the following in the patient's chart:   Medical and social history Use of alcohol, tobacco or illicit drugs  Current medications and supplements including opioid prescriptions. Patient is currently taking opioid prescriptions. Information provided to patient regarding non-opioid alternatives. Patient advised to discuss non-opioid treatment plan with their provider. Functional ability and status Nutritional status Physical activity Advanced directives List of other physicians Hospitalizations, surgeries, and ER visits in previous 12 months Vitals Screenings to include cognitive, depression, and falls Referrals and appointments  In addition, I have reviewed and discussed with patient certain preventive protocols, quality metrics, and best practice recommendations. A written personalized care plan for preventive services as well as general preventive health recommendations were provided to patient.     Mickeal Needy, LPN   05/09/5783   After Visit Summary: (MyChart) Due to this being a telephonic visit, the after visit summary with patients personalized plan was  offered to patient via MyChart   Notes: Please refer to Routing Comments.

## 2023-12-07 ENCOUNTER — Telehealth: Payer: Self-pay | Admitting: *Deleted

## 2023-12-07 NOTE — Progress Notes (Signed)
 Complex Care Management Care Guide Note  12/07/2023 Name: Edward SCHLITT Sr. MRN: [id number] DOB: 1971-06-24  Edward Baltimore Sr. is a 53 y.o. year old male who is a primary care patient of Hoy Register, MD and is actively engaged with the care management team. I reached out to Edward Baltimore Sr. by phone today to assist with scheduling  with the RN Case Manager BSW.  Follow up plan: Telephone appointment with complex care management team member scheduled for:  [facility] 12/11/23  and 12/25/23 with SW   Gwenevere Ghazi  [facility], [facility] Guide  Direct Dial: [phone]  Fax [phone]

## 2023-12-11 ENCOUNTER — Ambulatory Visit: Payer: Self-pay

## 2023-12-12 NOTE — Patient Instructions (Signed)
 Visit Information  Thank you for taking time to visit with me today. Please don't hesitate to contact me if I can be of assistance to you.   Following are the goals we discussed today:   Goals Addressed             This Visit's Progress    To get a second opinion for cause of stomach pain   On track    Care Coordination Interventions: Evaluation of current treatment plan related to persistent abdominal pain and patient's adherence to plan as established by provider Discussed and reviewed with patient his recent PCP follow up, review of patient status, including review of consultant's reports, relevant laboratory and other test results, and medications completed Chronic Abdominal Pain Patient reports ongoing abdominal pain, bloating, and diarrhea. Ultrasound showed fatty deposits on the liver. Patient suspects constipation, but reports frequent diarrhea. -Refer to GI specialist for further evaluation. -Consider trial of MiraLAX for potential constipation Determined patient verbalizes understanding of his doctor's recommendations     To lower A1c <7.0%   Not on track    Care Coordination Interventions: Evaluation of current treatment plan related to type 2 diabetes  and patient's adherence to plan as established by provider Review of patient status, including review of consultant's reports, relevant laboratory and other test results, and medications completed Uncontrolled Type 2 Diabetes Mellitus A1c increased to 10.1 from 6.1. Patient reports inconsistent blood glucose monitoring with highest reading of 140. In-office blood glucose reading of 277. Currently on Glipizide 10mg  and Januvia 25mg . -Discontinue Januvia 25mg . -Initiate Actos 15mg  daily due to hepatic steatosis -Encourage consistent blood glucose monitoring. -Would love to refer him for the liberate study with clinical pharmacy team but he declines Determined patient verbalizes understanding of his prescribed treatment plan   Lab Results  Component Value Date   HGBA1C 10.1 (H) 11/01/2023           COMPLETED: To resume statin therapy   On track    Care Coordination Interventions: Evaluation of current treatment plan related to Hyperlipidemia and Aortic Atherosclerosis and patient's adherence to plan as established by provider Review of patient status, including review of consultant's reports, relevant laboratory and other test results, and medications completed Hyperlipidemia Noted on previous labs. Patient reports recent consumption of eggs. -Defer cholesterol check due to recent food intake. -Encourage dietary modifications to reduce cholesterol intake. -Continue statin Determined patient verbalizes understanding of his doctor's recommendations  Lipid Panel     Component Value Date/Time   CHOL 137 10/21/2019 0854   TRIG 116 10/21/2019 0854   HDL 55 10/21/2019 0854   CHOLHDL 2.5 10/21/2019 0854   CHOLHDL 2.8 09/19/2016 1023   VLDL 37 (H) 09/19/2016 1023   LDLCALC 61 10/21/2019 0854   LABVLDL 21 10/21/2019 0854          To work with PT for low back pain, ankle pain and knee pain   On track    Care Coordination Interventions: Determined patient established with pain management  Discussed importance of adherence to all scheduled medical appointments Counseled on the importance of reporting any/all new or changed pain symptoms or management strategies to pain management provider  Review of patient status, including review of consultant's reports, relevant laboratory and other test results, and medications completed Chronic Low Back Pain and Knee Pain Patient reports ongoing low back and knee pain. Previous evaluation by orthopedic specialist suggested arthritis as cause of back pain and patellofemoral arthralgia for knee pain. Patient is not currently  taking prescribed Lyrica. -Refill Lyrica prescription for pain management. -Refer back to orthopedic specialist for further evaluation and potential  treatment options. -Suggest over-the-counter Lidoderm patches for pain management. Reviewed and discussed patient's upcoming scheduled visit with Dr. Lequita Halt scheduled for 02/02/24 Discussed plans with patient for ongoing care coordination follow up and provided patient with direct contact information for nurse care coordinator Scheduled nurse follow up call with patient for 12/26/23 @1 :00 PM         Our next appointment is by telephone on 12/26/23 at 1:00 PM  Please call the care guide team at [phone] if you need to cancel or reschedule your appointment.   If you are experiencing a Mental Health or Behavioral Health Crisis or need someone to talk to, please call 1-800-273-TALK (toll free, 24 hour hotline)  Patient verbalizes understanding of instructions and care plan provided today and agrees to view in MyChart. Active MyChart status and patient understanding of how to access instructions and care plan via MyChart confirmed with patient.     Delsa Sale RN BSN CCM [place]  [facility], [address] Nurse Care Coordinator  Direct Dial: [phone] Website: [email or web address]

## 2023-12-12 NOTE — Telephone Encounter (Signed)
 Message received from [facility] stating patient has been certified for [employer]

## 2023-12-12 NOTE — Patient Outreach (Signed)
 Care Coordination   Follow Up Visit Note   12/11/2023 Name: Edward DANGERFIELD Sr. MRN: [id number] DOB: 02-Jan-1971  Edward Baltimore Sr. is a 53 y.o. year old male who sees Hoy Register, MD for primary care. I spoke with  Edward Baltimore Sr. by phone today.  What matters to the patients health and wellness today?  Patient would like to improve Self-Health management of his chronic health conditions.     Goals Addressed             This Visit's Progress    To get a second opinion for cause of stomach pain   On track    Care Coordination Interventions: Evaluation of current treatment plan related to persistent abdominal pain and patient's adherence to plan as established by provider Discussed and reviewed with patient his recent PCP follow up, review of patient status, including review of consultant's reports, relevant laboratory and other test results, and medications completed Chronic Abdominal Pain Patient reports ongoing abdominal pain, bloating, and diarrhea. Ultrasound showed fatty deposits on the liver. Patient suspects constipation, but reports frequent diarrhea. -Refer to GI specialist for further evaluation. -Consider trial of MiraLAX for potential constipation Determined patient verbalizes understanding of his doctor's recommendations     To lower A1c <7.0%   Not on track    Care Coordination Interventions: Evaluation of current treatment plan related to type 2 diabetes  and patient's adherence to plan as established by provider Review of patient status, including review of consultant's reports, relevant laboratory and other test results, and medications completed Uncontrolled Type 2 Diabetes Mellitus A1c increased to 10.1 from 6.1. Patient reports inconsistent blood glucose monitoring with highest reading of 140. In-office blood glucose reading of 277. Currently on Glipizide 10mg  and Januvia 25mg . -Discontinue Januvia 25mg . -Initiate Actos 15mg  daily due to  hepatic steatosis -Encourage consistent blood glucose monitoring. -Would love to refer him for the liberate study with clinical pharmacy team but he declines Determined patient verbalizes understanding of his prescribed treatment plan  Lab Results  Component Value Date   HGBA1C 10.1 (H) 11/01/2023       COMPLETED: To resume statin therapy   On track    Care Coordination Interventions: Evaluation of current treatment plan related to Hyperlipidemia and Aortic Atherosclerosis and patient's adherence to plan as established by provider Review of patient status, including review of consultant's reports, relevant laboratory and other test results, and medications completed Hyperlipidemia Noted on previous labs. Patient reports recent consumption of eggs. -Defer cholesterol check due to recent food intake. -Encourage dietary modifications to reduce cholesterol intake. -Continue statin Determined patient verbalizes understanding of his doctor's recommendations  Lipid Panel     Component Value Date/Time   CHOL 137 10/21/2019 0854   TRIG 116 10/21/2019 0854   HDL 55 10/21/2019 0854   CHOLHDL 2.5 10/21/2019 0854   CHOLHDL 2.8 09/19/2016 1023   VLDL 37 (H) 09/19/2016 1023   LDLCALC 61 10/21/2019 0854   LABVLDL 21 10/21/2019 0854          To work with PT for low back pain, ankle pain and knee pain   On track    Care Coordination Interventions: Determined patient established with pain management  Discussed importance of adherence to all scheduled medical appointments Counseled on the importance of reporting any/all new or changed pain symptoms or management strategies to pain management provider  Review of patient status, including review of consultant's reports, relevant laboratory and other test results, and medications completed Chronic  Low Back Pain and Knee Pain Patient reports ongoing low back and knee pain. Previous evaluation by orthopedic specialist suggested arthritis as cause of  back pain and patellofemoral arthralgia for knee pain. Patient is not currently taking prescribed Lyrica. -Refill Lyrica prescription for pain management. -Refer back to orthopedic specialist for further evaluation and potential treatment options. -Suggest over-the-counter Lidoderm patches for pain management. Reviewed and discussed patient's upcoming scheduled visit with Dr. Lequita Halt scheduled for 02/02/24 Discussed plans with patient for ongoing care coordination follow up and provided patient with direct contact information for nurse care coordinator Scheduled nurse follow up call with patient for 12/26/23 @1 :00 PM     Interventions Today    Flowsheet Row Most Recent Value  Chronic Disease   Chronic disease during today's visit Diabetes, Other, Hypertension (HTN)  [chronic bilateral knee pain,  chronic abdominal pain,  herniated lumbar disc,  hyperlipidemia]  General Interventions   General Interventions Discussed/Reviewed General Interventions Discussed, General Interventions Reviewed, Labs, Durable Medical Equipment (DME), Doctor Visits  Doctor Visits Discussed/Reviewed Doctor Visits Reviewed, Doctor Visits Discussed, Specialist  Durable Medical Equipment (DME) Glucomoter, BP Cuff  Education Interventions   Education Provided Provided Education, Provided Printed Education  Provided Verbal Education On Nutrition, Labs, Medication, When to see the doctor, Blood Sugar Monitoring  Labs Reviewed Lipid Profile, Hgb A1c, Kidney Function  Nutrition Interventions   Nutrition Discussed/Reviewed Nutrition Reviewed, Nutrition Discussed, Fluid intake  Pharmacy Interventions   Pharmacy Dicussed/Reviewed Pharmacy Topics Discussed, Pharmacy Topics Reviewed, Medications and their functions          SDOH assessments and interventions completed:  No     Care Coordination Interventions:  Yes, provided   Follow up plan: Follow up call scheduled for 12/26/23 @1 :00 PM    Encounter Outcome:  Patient  Visit Completed

## 2023-12-13 ENCOUNTER — Encounter

## 2023-12-13 ENCOUNTER — Telehealth: Payer: Self-pay | Admitting: *Deleted

## 2023-12-13 NOTE — Progress Notes (Signed)
 Complex Care Management Note Care Guide Note  12/13/2023 Name: Edward [name] Sr. MRN: [id number] DOB: May 12, 1971  [name] is a 53 y.o. year old male who is a primary care patient of Hoy Register, MD . The community resource team was consulted for assistance with Transportation Needs , Food Insecurity, and Financial Difficulties related to utilities  SDOH screenings and interventions completed:  Yes  Social Drivers of Health From This Encounter   Financial Resource Strain: High Risk (12/13/2023)   Overall Financial Resource Strain (CARDIA)    Difficulty of Paying Living Expenses: Hard  Transportation Needs: No Transportation Needs (12/13/2023)   PRAPARE - Administrator, Civil Service (Medical): No    Lack of Transportation (Non-Medical): No    SDOH Interventions Today    Flowsheet Row Most Recent Value  SDOH Interventions   Food Insecurity Interventions Community Resources Provided  [Provided [place] food finder]  Transportation Interventions Community Resources Provided, [employer] Referral  [Has transportation through [employer] access and medicaid]  Utilities Interventions AMB Referral, [employer] Resources Provided  [patient provided resource for [employer] Strain Interventions [employer] Provided  [patient provided resources as was available to him]        Care guide performed the following interventions: Patient provided with information about care guide support team and interviewed to confirm resource needs.  Follow Up Plan:  No further follow up planned at this time. The patient has been provided with needed resources.  Encounter Outcome:  Patient Visit Completed  [name]  [facility] Health[facility] Health Care Guide  Direct Dial:[phone] Fax:[phone] Website: [email or web address]

## 2023-12-14 ENCOUNTER — Encounter: Payer: Self-pay | Admitting: Internal Medicine

## 2023-12-21 ENCOUNTER — Telehealth: Payer: Self-pay

## 2023-12-21 NOTE — Telephone Encounter (Signed)
 Please arrange an office visit - next available is ok.

## 2023-12-21 NOTE — Telephone Encounter (Signed)
 Dr. Leone Payor,  It appears this patient is ESRD and is currently receiving dialysis.  Per protocol, this patient's procedure should be at the hospital. Please advise if the patient will need to have an OV or if he can be a direct hospital procedure? Please/Thank you Bre, PV RN

## 2023-12-22 NOTE — Telephone Encounter (Signed)
 Called patient and scheduled OV for 02/27/24.  Patient notified

## 2023-12-22 NOTE — Telephone Encounter (Addendum)
 Edward [name], Will you please call this patient and have him scheduled for an OV as he is a dialysis patient and will need to be scheduled at the hospital for his procedure? Please/Thank you Bre   I have cancelled his PV and his procedure in EPIC;

## 2023-12-25 ENCOUNTER — Ambulatory Visit: Payer: Self-pay | Admitting: Licensed Clinical Social Worker

## 2023-12-25 NOTE — Patient Instructions (Signed)
 Visit Information  Thank you for taking time to visit with me today. Please don't hesitate to contact me if I can be of assistance to you.   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 Sw will mail out the food pantry list and educated patient on the [place] [employer] app ([employer]). Patient stated that his utility bills are higher than normal and he is behind so he is sure that the next notice will be a disconnection notice. SW encouraged patient to make arrangements and the SW will mail out a utility resources list.  SW reminded patient about upcoming appoint with [facility] RN Lawanna Kobus little on tomorrow, Sw provided time and date. SW Signe Colt) will follow up on 01/10/2024 at 1:00 pm        Our next appointment is by telephone on 01/10/2024 at 1:00 pm  Please call the care guide team at [phone] if you need to cancel or reschedule your appointment.   If you are experiencing a Mental Health or Behavioral Health Crisis or need someone to talk to, please call the Suicide and Crisis Lifeline: 988 go to [facility] [address] [place] [phone]) call 911  Patient verbalizes understanding of instructions and care plan provided today and agrees to view in MyChart. Active MyChart status and patient understanding of how to access instructions and care plan via MyChart confirmed with patient.     Jeanie Cooks, PhD [facility], [facility] Social Worker Direct Dial: [phone]  Fax: [phone]

## 2023-12-25 NOTE — Patient Outreach (Addendum)
 Care Coordination   Initial Visit Note   12/25/2023 Name: Edward HANK Sr. MRN: [id number] DOB: Dec 31, 1970  Edward Baltimore Sr. is a 53 y.o. year old male who sees Hoy Register, MD for primary care. I spoke with  Edward Baltimore Sr. by phone today.  What matters to the patients health and wellness today?  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 Sw will mail out the food pantry list and educated patient on the [place] [employer] app ([employer]). Patient stated that his utility bills are higher than normal and he is behind so he is sure that the next notice will be a disconnection notice. SW encouraged patient to make arrangements and the SW will mail out a utility resources list.  SW reminded patient about upcoming appoint with [facility] RN Lawanna Kobus little on tomorrow, Sw provided time and date. SW Signe Colt) will follow up on 01/10/2024 at 1:00 pm        SDOH assessments and interventions completed:  Yes  SDOH Interventions Today    Flowsheet Row Most Recent Value  SDOH Interventions   Food Insecurity Interventions Other (Comment), Community Resources Provided  [SW will send a list of food pantry's and educated the patient on [employer] App]  Housing Interventions Intervention Not Indicated  Transportation Interventions Intervention Not Indicated  [Patient uses [employer]]  Utilities Interventions Community Resources Provided  [Sw will mail out resources list patient stated that he is trying but will soon get a diconnection notice soon.]        Care Coordination Interventions:  Yes, provided  Interventions Today     Flowsheet Row Most Recent Value  General Interventions   General Interventions Discussed/Reviewed General Interventions Reviewed  [SW will mail food pantry and utility resources list and educated patient on the [employer] App.]        Follow up plan: Follow up call scheduled for 01/10/2024 at 1:00 pm    Encounter Outcome:  Patient Visit Completed   Jeanie Cooks, PhD [facility], [facility] Social Worker Direct Dial: [phone]  Fax: [phone]

## 2023-12-26 ENCOUNTER — Ambulatory Visit: Payer: Self-pay

## 2023-12-26 NOTE — Patient Outreach (Signed)
 Care Coordination   12/26/2023 Name: Edward [name] Sr. MRN: [id number] DOB: 05/06/1971   Care Coordination Outreach Attempts:  An unsuccessful outreach was attempted for an appointment today.  Follow Up Plan:  Additional outreach attempts will be made to offer the patient complex care management information and services.   Encounter Outcome:  No Answer   Care Coordination Interventions:  No, not indicated    Delsa Sale RN BSN CCM [place]  [facility], [facility] Health Nurse Care Coordinator  Direct Dial: [phone] Website: [email or web address]

## 2023-12-26 NOTE — Patient Outreach (Signed)
 Care Coordination   Follow Up Visit Note   12/26/2023 Name: Edward ROSELLE Sr. MRN: [id number] DOB: [birth year]  Edward Baltimore Sr. is a 53 y.o. year old male who sees Hoy Register, MD for primary care. I spoke with  Edward Baltimore Sr. by phone today.  What matters to the patients health and wellness today?  Patient will verify if he picked up Pioglitazone and is taking it as prescribed.     Goals Addressed             This Visit's Progress    To lower A1c <7.0%   On track    Care Coordination Interventions: Evaluation of current treatment plan related to type 2 diabetes  and patient's adherence to plan as established by provider Spoke with patient, he did not receive a call from [facility] regarding his new Rx for Pioglitazone  Placed outbound joint call to [facility] to check the Rx status, patient was advised this Rx was picked up from the pharmacy on 12/10/23 Determined patient does not recall picking up this medication with his others, he is not at home, he will confirm once he returns home from work Educated patient about the dosage, frequency and indication for Pioglitazone, instructed patient to stop Januvia per PCP and patient verbalizes understanding and is able to repeat back the instructions provided Discussed plans with patient for ongoing care coordination follow up with Juanell Fairly RN Scheduled a nurse follow up call with patient to speak with Traci RN on 01/16/24 @3 :00 PM Note routed to Juanell Fairly RN Lab Results  Component Value Date   HGBA1C 10.1 (H) 11/01/2023      Interventions Today    Flowsheet Row Most Recent Value  Chronic Disease   Chronic disease during today's visit Diabetes  General Interventions   General Interventions Discussed/Reviewed Doctor Visits, General Interventions Reviewed, General Interventions Discussed, Communication with  Doctor Visits Discussed/Reviewed Doctor Visits Reviewed, Doctor Visits Discussed, PCP   Communication with Pharmacists  [[facility]]  Education Interventions   Provided Verbal Education On Medication, When to see the doctor  Pharmacy Interventions   Pharmacy Dicussed/Reviewed Pharmacy Topics Discussed, Pharmacy Topics Reviewed, Medications and their functions, Medication Adherence          SDOH assessments and interventions completed:  No     Care Coordination Interventions:  Yes, provided   Follow up plan: Follow up call scheduled for 01/16/24 @3 :00 PM    Encounter Outcome:  Patient Visit Completed

## 2023-12-26 NOTE — Patient Instructions (Addendum)
 Visit Information  Thank you for taking time to visit with me today. Please don't hesitate to contact me if I can be of assistance to you.   Following are the goals we discussed today:   Goals Addressed             This Visit's Progress    To lower A1c <7.0%   On track    Care Coordination Interventions: Evaluation of current treatment plan related to type 2 diabetes  and patient's adherence to plan as established by provider Spoke with patient, he did not receive a call from [facility] regarding his new Rx for Pioglitazone  Placed outbound joint call to [facility] to check the Rx status, patient was advised this Rx was picked up from the pharmacy on 12/10/23 Determined patient does not recall picking up this medication with his others, he is not at home, he will confirm once he returns home from work Educated patient about the dosage, frequency and indication for Pioglitazone, instructed patient to stop Januvia per PCP and patient verbalizes understanding and is able to repeat back the instructions provided Discussed plans with patient for ongoing care coordination follow up with Juanell Fairly RN Scheduled a nurse follow up call with patient to speak with Traci RN on 01/16/24 @3 :00 PM Note routed to Juanell Fairly RN Lab Results  Component Value Date   HGBA1C 10.1 (H) 11/01/2023          Our next appointment is by telephone on 01/16/24 at 3:00 PM  Please call the care guide team at [phone] if you need to cancel or reschedule your appointment.   If you are experiencing a Mental Health or Behavioral Health Crisis or need someone to talk to, please call 1-800-273-TALK (toll free, 24 hour hotline)  The patient verbalized understanding of instructions, educational materials, and care plan provided today and agreed to receive a mailed copy of patient instructions, educational materials, and care plan.   Delsa Sale RN BSN CCM [place]  [facility], [facility]  Health Nurse Care Coordinator  Direct Dial: [phone] Website: [email or web address]

## 2024-01-01 ENCOUNTER — Encounter

## 2024-01-10 ENCOUNTER — Other Ambulatory Visit: Payer: Self-pay

## 2024-01-15 DIAGNOSIS — E890 Postprocedural hypothyroidism: Secondary | ICD-10-CM | POA: Diagnosis not present

## 2024-01-15 DIAGNOSIS — E1121 Type 2 diabetes mellitus with diabetic nephropathy: Secondary | ICD-10-CM | POA: Diagnosis not present

## 2024-01-16 ENCOUNTER — Ambulatory Visit: Payer: Self-pay

## 2024-01-16 NOTE — Patient Outreach (Signed)
 Complex Care Management   Visit Note  01/16/2024  Name:  Edward BOER Sr. MRN: [id number] DOB: Jan 19, 1971  Situation: Referral received for Complex Care Management related to Diabetes  I obtained verbal consent from Patient.  Visit completed with patient  on the phone  Background:   Past Medical History:  Diagnosis Date   Acute on chronic renal failure (HCC) 11/25/2015   Anemia    Arthritis    Blind    Left eye   Cataract    CHF (congestive heart failure) (HCC)    Chronic kidney disease    stage 5   COVID-19    Depression    situatuional   Diabetes mellitus    Type II   GERD (gastroesophageal reflux disease)    Graves disease 2014   Headache    in past   Hx of adenomatous and sessile serrated colonic polyps 11/21/2019   Hyperlipidemia    Hypertension    Hypothyroidism    Legally blind    right eye   Neuropathy    Non-compliance    Proteinuria 05/24/2020   Shortness of breath dyspnea    "Better since I've been taking my medications"    Assessment: Patient Reported Symptoms:  Cognitive Cognitive Status: Able to follow simple commands, Alert and oriented to person, place, and time, Insightful and able to interpret abstract concepts      Neurological      HEENT HEENT Symptoms Reported: No symptoms reported HEENT Self-Management Outcome: 3 (uncertain)    Cardiovascular Cardiovascular Symptoms Reported: Swelling in legs or feet Cardiovascular Conditions: Heart failure Cardiovascular Management Strategies: Medication therapy  Respiratory Respiratory Symptoms Reported: No symptoms reported    Endocrine Is patient diabetic?:  (10.9) Is patient checking blood sugars at home?: No Endocrine Management Strategies: Medication therapy Endocrine Self-Management Outcome: 3 (uncertain)  Gastrointestinal Gastrointestinal Symptoms Reported: Change in appetite, Abdominal pain or discomfort, Cramping Gastrointestinal Management Strategies: Medication  therapy Gastrointestinal Self-Management Outcome: 3 (uncertain)    Genitourinary      Integumentary Integumentary Symptoms Reported: No symptoms reported    Musculoskeletal     Falls in the past year?: Yes Number of falls in past year: 2 or more Was there an injury with Fall?: No Fall Risk Category Calculator: 2 Patient Fall Risk Level: Moderate Fall Risk Patient at Risk for Falls Due to: Impaired balance/gait Fall risk Follow up: Falls evaluation completed  Psychosocial Psychosocial Symptoms Reported: No symptoms reported            01/16/2024    3:29 PM  Depression screen PHQ 2/9  Decreased Interest 0  Down, Depressed, Hopeless 0  PHQ - 2 Score 0    There were no vitals filed for this visit.  Medications Reviewed Today     Reviewed by Augustin Leber, RN (Registered Nurse) on 01/16/24 at 1515  Med List Status: <None>   Medication Order Taking? Sig Documenting Provider Last Dose Status Informant  Accu-Chek Softclix Lancets lancets [id number] Yes Use to check blood sugar once daily. E11.39 Newlin, Enobong, MD Taking Active   acetaminophen (TYLENOL) 500 MG tablet [id number] Yes Take 1 tablet (500 mg total) by mouth every 6 (six) hours as needed for moderate pain (pain score 4-6) or mild pain (pain score 1-3). Newlin, Enobong, MD Taking Active   Alcohol Swabs (B-D SINGLE USE SWABS REGULAR) PADS [id number] Yes Use as directed. Newlin, Enobong, MD Taking Active Self  ammonium lactate (AMLACTIN DAILY) 12 % lotion [id number] Yes Apply 1 Application topically as  needed for dry skin. Hoy Register, MD Taking Active   atorvastatin (LIPITOR) 40 MG tablet [id number] Yes Take 1 tablet (40 mg total) by mouth daily. Hoy Register, MD Taking Active   Blood Glucose Calibration [facility] CONTROL) Criss Rosales [id number] Yes  [provider] Taking Active   Blood Glucose Monitoring Suppl (ACCU-CHEK GUIDE) w/Device KIT [id number] Yes Use to check blood sugar once daily. E11.39 Hoy Register,  MD Taking Active   carvedilol (COREG) 12.5 MG tablet [id number] Yes Take 12.5 mg by mouth 2 (two) times daily with a meal. [provider] Taking Active   diclofenac Sodium (VOLTAREN) 1 % GEL [id number] Yes Apply 4 g topically 4 (four) times daily. Hoy Register, MD Taking Active   dicyclomine (BENTYL) 20 MG tablet [id number] Yes Take 1 tablet (20 mg total) by mouth 2 (two) times daily as needed for spasms. Hoy Register, MD Taking Active   diphenoxylate-atropine (LOMOTIL) 2.5-0.025 MG tablet [id number] Yes Take 1 tablet by mouth 4 (four) times daily as needed for diarrhea or loose stools. Hoy Register, MD Taking Active   DULoxetine (CYMBALTA) 60 MG capsule [id number] Yes Take 1 capsule (60 mg total) by mouth daily. For chronic pain Hoy Register, MD Taking Active   furosemide (LASIX) 80 MG tablet [id number] Yes Take 1 tablet (80 mg total) by mouth 2 (two) times daily. On the days you DO NOT have dialysis Georgian Co M, PA-C Taking Active   glipiZIDE (GLUCOTROL XL) 10 MG 24 hr tablet [id number] Yes Take 1 tablet (10 mg total) by mouth daily. Hoy Register, MD Taking Active   glucose blood (ACCU-CHEK GUIDE) test strip [id number] Yes Use to check blood sugar once daily. E11.39 Hoy Register, MD Taking Active   IRON SUCROSE IV [id number]  Iron Sucrose (Venofer) [provider]  Expired 03/17/21 2359            Med Note Lia Hopping, SHELLY N   Mon Mar 21, 2022 12:04 AM)    levothyroxine (SYNTHROID) 150 MCG tablet [id number] Yes Take 1 tablet (150 mcg total) by mouth daily before breakfast. Hoy Register, MD Taking Active   metoCLOPramide (REGLAN) 10 MG tablet [id number] Yes Take 1 tablet (10 mg total) by mouth every 6 (six) hours as needed for nausea (nausea/headache). Charlynne Pander, MD Taking Active   mycophenolate (MYFORTIC) 180 MG EC tablet [id number] Yes Take 180 mg by mouth 2 (two) times daily. [provider] Taking Active   oxyCODONE (ROXICODONE) 5 MG  immediate release tablet [id number] Yes Take 1 tablet (5 mg total) by mouth every 4 (four) hours as needed for severe pain. Charlynne Pander, MD Taking Active   pantoprazole (PROTONIX) 40 MG tablet [id number] Yes Take 1 tablet (40 mg total) by mouth daily. Georgian Co M, PA-C Taking Active   pioglitazone (ACTOS) 15 MG tablet [id number] Yes Take 1 tablet (15 mg total) by mouth daily. Hoy Register, MD Taking Active   polyethylene glycol powder (GLYCOLAX/MIRALAX) 17 GM/SCOOP powder [id number] Yes Take as directed as needed for constipation Hoy Register, MD Taking Active   potassium chloride SA (KLOR-CON M) 20 MEQ tablet [id number]  Take 2 tablets (40 mEq total) by mouth daily for 3 days. Maia Plan, MD  Expired 03/24/22 2359   pregabalin (LYRICA) 25 MG capsule [id number] Yes Take 1 capsule (25 mg total) by mouth 2 (two) times daily. Hoy Register, MD Taking Active   sildenafil (VIAGRA) 100 MG tablet [id number] Yes Take 1 tablet (100 mg total) by mouth daily as needed for  erectile dysfunction. Newlin, Enobong, MD Taking Active   sulfamethoxazole-trimethoprim (BACTRIM) 400-80 MG tablet [id number] Yes Take 1 tablet by mouth 3 (three) times a week. [provider] Taking Active   tacrolimus (PROGRAF) 1 MG capsule [id number] Yes Take 1 mg by mouth 2 (two) times daily. [provider] Taking Active   valsartan (DIOVAN) 40 MG tablet [id number] Yes Take 1 tablet (40 mg total) by mouth daily. Newlin, Enobong, MD Taking Active             Recommendation:   PCP Follow-up  Follow Up Plan:   Telephone follow-up in 3 weeks   Rosalva Neary Arleta Lade RN, BSN, [facility]  [facility], [facility] Health  Care Coordinator Phone: [phone]

## 2024-01-16 NOTE — Patient Instructions (Signed)
 Visit Information  Thank you for taking time to visit with me today. Please don't hesitate to contact me if I can be of assistance to you before our next scheduled appointment.  Your next care management appointment is by telephone on 02/20/24 at 3 pm  Telephone follow-up in 3 weeks  Please call the care guide team at [phone] if you need to cancel, schedule, or reschedule an appointment.   Please call [phone]-TALK (toll free, 24 hour hotline) if you are experiencing a Mental Health or Behavioral Health Crisis or need someone to talk to.  Augustin Leber RN, BSN, [facility]  [facility], [facility] Health  Care Coordinator Phone: [phone]

## 2024-01-17 DIAGNOSIS — N1831 Chronic kidney disease, stage 3a: Secondary | ICD-10-CM | POA: Diagnosis not present

## 2024-01-17 DIAGNOSIS — D631 Anemia in chronic kidney disease: Secondary | ICD-10-CM | POA: Diagnosis not present

## 2024-01-17 DIAGNOSIS — Z79899 Other long term (current) drug therapy: Secondary | ICD-10-CM | POA: Diagnosis not present

## 2024-01-17 DIAGNOSIS — E872 Acidosis, unspecified: Secondary | ICD-10-CM | POA: Diagnosis not present

## 2024-01-17 DIAGNOSIS — I129 Hypertensive chronic kidney disease with stage 1 through stage 4 chronic kidney disease, or unspecified chronic kidney disease: Secondary | ICD-10-CM | POA: Diagnosis not present

## 2024-01-17 DIAGNOSIS — N189 Chronic kidney disease, unspecified: Secondary | ICD-10-CM | POA: Diagnosis not present

## 2024-01-17 DIAGNOSIS — R748 Abnormal levels of other serum enzymes: Secondary | ICD-10-CM | POA: Diagnosis not present

## 2024-01-17 DIAGNOSIS — N2581 Secondary hyperparathyroidism of renal origin: Secondary | ICD-10-CM | POA: Diagnosis not present

## 2024-01-17 DIAGNOSIS — I770 Arteriovenous fistula, acquired: Secondary | ICD-10-CM | POA: Diagnosis not present

## 2024-01-17 DIAGNOSIS — Z940 Kidney transplant status: Secondary | ICD-10-CM | POA: Diagnosis not present

## 2024-01-24 ENCOUNTER — Encounter: Payer: Self-pay | Admitting: Podiatry

## 2024-01-24 ENCOUNTER — Ambulatory Visit (INDEPENDENT_AMBULATORY_CARE_PROVIDER_SITE_OTHER): Payer: 59 | Admitting: Podiatry

## 2024-01-24 DIAGNOSIS — B351 Tinea unguium: Secondary | ICD-10-CM

## 2024-01-24 DIAGNOSIS — E1142 Type 2 diabetes mellitus with diabetic polyneuropathy: Secondary | ICD-10-CM

## 2024-01-24 NOTE — Progress Notes (Signed)
 This patient returns to my office for at risk foot care.  This patient requires this care by a professional since this patient will be at risk due to having ESRD, Blind,   and DM with polyneuropathy.  This patient is unable to cut nails himself since the patient cannot reach his nails.These nails are painful walking and wearing shoes.  Patient has kidney transplant.  Patient also has callus developing and occasional hurting  right big toe.This patient presents for at risk foot care today.  General Appearance  Alert, conversant and in no acute stress.  Vascular  Dorsalis pedis and posterior tibial  pulses are  weakly palpable  bilaterally.  Capillary return is within normal limits  bilaterally. Cold feet. bilaterally.  Neurologic  Senn-Weinstein monofilament wire test diminished  bilaterally. Muscle power within normal limits bilaterally.  Nails Thick disfigured discolored nails with subungual debris  from hallux to fifth toes bilaterally. No evidence of bacterial infection or drainage bilaterally. HAV  B/L.  Orthopedic  No limitations of motion  feet .  No crepitus or effusions noted.  No bony pathology or digital deformities noted.  HAV  B/L.  Numbness, shooting pain into his right big toe.  Skin  normotropic skin with no porokeratosis noted bilaterally.  No signs of infections or ulcers noted.  Callus distal aspect right hallux.   Onychomycosis  Pain in right toes  Pain in left toes  Consent was obtained for treatment procedures.   Mechanical debridement of nails 1-5  bilaterally performed with a nail nipper.  Filed with dremel without incident.    Return office visit  3 months                    Told patient to return for periodic foot care and evaluation due to potential at risk complications.   Helane Gunther DPM

## 2024-01-26 LAB — LAB REPORT - SCANNED
Creatinine, POC: 75.7 mg/dL
EGFR: 54
HM HIV Screening: NEGATIVE
HM Hepatitis Screen: NEGATIVE

## 2024-01-28 NOTE — Progress Notes (Unsigned)
 S:     No chief complaint on file.  53 y.o. male who presents for diabetes evaluation, education, and management. Patient arrives in *** good spirits and presents without *** any assistance. ***Patient is accompanied by ***.   Patient was referred and last seen by Primary Care Provider, Dr. ***, on ***.  *** Patient was referred by *** on ***. Patient was last seen by Primary Care Provider, Dr. ***, on ***.   PMH is significant for ***.  At last visit, ***.   Patient reports Diabetes was diagnosed in ***.   Family/Social History: ***  Current diabetes medications include: *** Current hypertension medications include: *** Current hyperlipidemia medications include: ***  Patient reports adherence to taking all medications as prescribed.  *** Patient denies adherence with medications, reports missing *** medications *** times per week, on average.  Do you feel that your medications are working for you? {YES NO:22349} Have you been experiencing any side effects to the medications prescribed? {YES NO:22349} Do you have any problems obtaining medications due to transportation or finances? {YES [id number] Insurance coverage: ***  Patient {Actions; denies-reports:120008} hypoglycemic events.  Reported home fasting blood sugars: ***  Reported 2 hour post-meal/random blood sugars: ***.  Patient {Actions; denies-reports:120008} nocturia (nighttime urination).  Patient {Actions; denies-reports:120008} neuropathy (nerve pain). Patient {Actions; denies-reports:120008} visual changes. Patient {Actions; denies-reports:120008} self foot exams.   Patient reported dietary habits: Eats *** meals/day Breakfast: *** Lunch: *** Dinner: *** Snacks: *** Drinks: ***  Within the past 12 months, did you worry whether your food would run out before you got money to buy more? {YES NO:22349} Within the past 12 months, did the food you bought run out, and you didn't have money to get more? {YES  NO:22349} PHQ-9 Score: ***  Patient-reported exercise habits: ***   O:   ROS  Physical Exam  7 day average blood glucose: ***  Libre3 CGM Download today *** % Time CGM is active: ***% Average Glucose: *** mg/dL Glucose Management Indicator: ***  Glucose Variability: ***% (goal <36%) Time in Goal:  - Time in range 70-180: ***% - Time above range: ***% - Time below range: ***% Observed patterns:   Lab Results  Component Value Date   HGBA1C 10.1 (H) 11/01/2023   There were no vitals filed for this visit.  Lipid Panel     Component Value Date/Time   CHOL 137 10/21/2019 0854   TRIG 116 10/21/2019 0854   HDL 55 10/21/2019 0854   CHOLHDL 2.5 10/21/2019 0854   CHOLHDL 2.8 09/19/2016 1023   VLDL 37 (H) 09/19/2016 1023   LDLCALC 61 10/21/2019 0854    Clinical Atherosclerotic Cardiovascular Disease (ASCVD): {YES/NO:21197} The 10-year ASCVD risk score (Arnett DK, et al., 2019) is: 25.9%   Values used to calculate the score:     Age: 27 years     Sex: Male     Is Non-Hispanic African American: Yes     Diabetic: Yes     Tobacco smoker: Yes     Systolic Blood Pressure: 130 mmHg     Is BP treated: Yes     HDL Cholesterol: 79 mg/dL     Total Cholesterol: 202 mg/dL   Patient is participating in a Managed Medicaid Plan:  {MM YES/NO:27447::"Yes"}   A/P: Diabetes longstanding *** currently ***. Patient is *** able to verbalize appropriate hypoglycemia management plan. Medication adherence appears ***. Control is suboptimal due to ***. -{Meds adjust:18428} basal insulin  *** Lantus /Basaglar /Semglee  (insulin  glargine) *** Horace Lye (insulin   degludec) from *** units to *** units daily in the morning. Patient will continue to titrate 1 unit every *** days if fasting blood sugar > 100mg /dl until fasting blood sugars reach goal or next visit.  -{Meds adjust:18428} rapid insulin  *** Novolog  (insulin  aspart) *** Humalog (insulin  lispro) from *** to ***.  -{Meds adjust:18428} GLP-1 ***  Trulicity (dulaglutide) *** Ozempic (semaglutide) *** Mounjaro (tirzepatide) from *** mg to *** mg .  -{Meds adjust:18428} SGLT2-I *** Farxiga (dapagliflozin) *** Jardiance (empagliflozin) 10 mg. Counseled on sick day rules. -{Meds adjust:18428} metformin  ***.  -Patient educated on purpose, proper use, and potential adverse effects of ***.  -Extensively discussed pathophysiology of diabetes, recommended lifestyle interventions, dietary effects on blood sugar control.  -Counseled on s/sx of and management of hypoglycemia.  -Next A1c anticipated ***.   ASCVD risk - primary ***secondary prevention in patient with diabetes. Last LDL is *** not at goal of <40 *** mg/dL. ASCVD risk factors include *** and 10-year ASCVD risk score of ***. {Desc; low/moderate/high:110033} intensity statin indicated.  -{Meds adjust:18428} ***statin *** mg.   Hypertension longstanding *** currently ***. Blood pressure goal of <130/80 *** mmHg. Medication adherence ***. Blood pressure control is suboptimal due to ***. -{Meds adjust:18428} *** mg.  Written patient instructions provided. Patient verbalized understanding of treatment plan.  Total time in face to face counseling *** minutes.    Follow-up:  Pharmacist *** PCP clinic visit in *** Patient seen with ***

## 2024-01-29 ENCOUNTER — Ambulatory Visit: Attending: Nurse Practitioner | Admitting: Pharmacist

## 2024-01-29 ENCOUNTER — Encounter: Payer: Self-pay | Admitting: Pharmacist

## 2024-01-29 VITALS — BP 149/80 | HR 64

## 2024-01-29 DIAGNOSIS — Z7984 Long term (current) use of oral hypoglycemic drugs: Secondary | ICD-10-CM | POA: Diagnosis not present

## 2024-01-29 DIAGNOSIS — E1139 Type 2 diabetes mellitus with other diabetic ophthalmic complication: Secondary | ICD-10-CM | POA: Diagnosis not present

## 2024-01-29 DIAGNOSIS — E113593 Type 2 diabetes mellitus with proliferative diabetic retinopathy without macular edema, bilateral: Secondary | ICD-10-CM | POA: Diagnosis not present

## 2024-01-29 DIAGNOSIS — H2522 Age-related cataract, morgagnian type, left eye: Secondary | ICD-10-CM | POA: Diagnosis not present

## 2024-01-29 DIAGNOSIS — H25811 Combined forms of age-related cataract, right eye: Secondary | ICD-10-CM | POA: Diagnosis not present

## 2024-01-29 DIAGNOSIS — H04123 Dry eye syndrome of bilateral lacrimal glands: Secondary | ICD-10-CM | POA: Diagnosis not present

## 2024-01-30 ENCOUNTER — Encounter: Admitting: Internal Medicine

## 2024-02-01 DIAGNOSIS — Z940 Kidney transplant status: Secondary | ICD-10-CM | POA: Diagnosis not present

## 2024-02-02 DIAGNOSIS — M545 Low back pain, unspecified: Secondary | ICD-10-CM | POA: Diagnosis not present

## 2024-02-02 DIAGNOSIS — G8929 Other chronic pain: Secondary | ICD-10-CM | POA: Diagnosis not present

## 2024-02-02 DIAGNOSIS — M25561 Pain in right knee: Secondary | ICD-10-CM | POA: Diagnosis not present

## 2024-02-02 DIAGNOSIS — M25562 Pain in left knee: Secondary | ICD-10-CM | POA: Diagnosis not present

## 2024-02-20 ENCOUNTER — Other Ambulatory Visit: Payer: Self-pay

## 2024-02-21 ENCOUNTER — Encounter (HOSPITAL_COMMUNITY): Payer: Self-pay

## 2024-02-21 ENCOUNTER — Encounter: Payer: Self-pay | Admitting: Gastroenterology

## 2024-02-21 ENCOUNTER — Ambulatory Visit (INDEPENDENT_AMBULATORY_CARE_PROVIDER_SITE_OTHER): Admitting: Gastroenterology

## 2024-02-21 VITALS — BP 130/80 | Ht 72.0 in | Wt 201.0 lb

## 2024-02-21 DIAGNOSIS — R6881 Early satiety: Secondary | ICD-10-CM | POA: Diagnosis not present

## 2024-02-21 DIAGNOSIS — K219 Gastro-esophageal reflux disease without esophagitis: Secondary | ICD-10-CM | POA: Diagnosis not present

## 2024-02-21 DIAGNOSIS — Z860100 Personal history of colon polyps, unspecified: Secondary | ICD-10-CM

## 2024-02-21 DIAGNOSIS — R197 Diarrhea, unspecified: Secondary | ICD-10-CM | POA: Diagnosis not present

## 2024-02-21 DIAGNOSIS — Z940 Kidney transplant status: Secondary | ICD-10-CM

## 2024-02-21 DIAGNOSIS — K529 Noninfective gastroenteritis and colitis, unspecified: Secondary | ICD-10-CM

## 2024-02-21 MED ORDER — NA SULFATE-K SULFATE-MG SULF 17.5-3.13-1.6 GM/177ML PO SOLN: 1.0000 | ORAL | 0 refills | Status: DC

## 2024-02-21 NOTE — Patient Instructions (Signed)
 We have sent the following medications to your pharmacy for you to pick up at your convenience: Suprep   You have been scheduled for a gastric emptying scan at [facility] on 03/11/24  at 8:00 am . Please arrive at least 30 minutes prior to your appointment for registration. Please make certain not to have anything to eat or drink after midnight the night before your test. Hold all stomach medications (ex: Zofran , phenergan , Reglan ) 24 hours prior to your test. If you need to reschedule your appointment, please contact radiology scheduling at [phone]. _____________________________________________________________________ A gastric-emptying study measures how long it takes for food to move through your stomach. There are several ways to measure stomach emptying. In the most common test, you eat food that contains a small amount of radioactive material. A scanner that detects the movement of the radioactive material is placed over your abdomen to monitor the rate at which food leaves your stomach. This test normally takes about 4 hours to complete. _____________________________________________________________________   Edward [name] have been scheduled for a colonoscopy. Please follow written instructions given to you at your visit today.   If you use inhalers (even only as needed), please bring them with you on the day of your procedure.  DO NOT TAKE 7 DAYS PRIOR TO TEST- Trulicity (dulaglutide) Ozempic, Wegovy (semaglutide) Mounjaro (tirzepatide) Bydureon Bcise (exanatide extended release)  DO NOT TAKE 1 DAY PRIOR TO YOUR TEST Rybelsus (semaglutide) Adlyxin (lixisenatide) Victoza (liraglutide) Byetta (exanatide) ___________________________________________________________________________   If your blood pressure at your visit was 140/90 or greater, please contact your primary care physician to follow up on this.  _______________________________________________________  If you are age  53 or older, your body mass index should be between 23-30. Your Body mass index is 27.26 kg/m. If this is out of the aforementioned range listed, please consider follow up with your Primary Care Provider.  If you are age 36 or younger, your body mass index should be between 19-25. Your Body mass index is 27.26 kg/m. If this is out of the aformentioned range listed, please consider follow up with your Primary Care Provider.   ________________________________________________________  The [facility] [place] [facility] providers would like to encourage you to use MYCHART to communicate with providers for non-urgent requests or questions.  Due to long hold times on the telephone, sending your provider a message by [facility] may be a faster and more efficient way to get a response.  Please allow 48 business hours for a response.  Please remember that this is for non-urgent requests.  _______________________________________________________  Thank you for choosing me and [facility].

## 2024-02-21 NOTE — Progress Notes (Unsigned)
 02/28/2024 Edward SWAIM Sr. [id number] 1970-12-24   HISTORY OF PRESENT ILLNESS: This is a 53 year old male who is a patient Dr. Jadene Maxwell.  He came in today primarily to schedule colonoscopy as last was February 2021 as below with the recall recommended in 3 years due to multiple adenomatous colon polyps.  It looks like nursing was under the impression that he is on dialysis so brought him in to schedule hospital procedure.  He actually had a renal transplant, no longer on dialysis.  His diabetes has been poorly controlled with last Hgb A1C of 10.1 in January.  Says that he does not check his blood sugars regularly.  Once he is here though he reports issues with frequent diarrhea.  Says that he gets full quickly, has a lot of acid reflux despite his pantoprazole  40 mg daily.  Uses Lomotil .  Is not a great historian.  EGD April 2022 showed some duodenitis.  1. Surgical [P], duodenal - PEPTIC DUODENITIS. - NO DYSPLASIA OR MALIGNANCY. 2. 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 - WARTHIN-STARRY IS NEGATIVE FOR HELICOBACTER PYLORI. - NO DYSPLASIA OR MALIGNANCY.  Colonoscopy 11/2019:  - Three 3 to 5 mm polyps in the sigmoid colon, in the transverse colon and in the cecum, removed with a cold snare. Resected and retrieved. - One 1 to 2 mm polyp in the sigmoid colon, removed with a cold biopsy forceps. Resected and retrieved. - The examination was otherwise normal on direct and retroflexion views. - Biopsies were taken with a cold forceps from the ascending colon, transverse colon, descending colon, sigmoid colon and rectum for evaluation of microscopic colitis.  1. Surgical [P], sigmoid x2, cecum x1, transverse x1, polyp (4) - TUBULAR ADENOMA. - NO HIGH GRADE DYSPLASIA OR MALIGNANCY. - SESSILE SERRATED POLYP(S) WITHOUT CYTOLOGIC DYSPLASIA. 2. Surgical [P], random sites - COLONIC MUCOSA WITH NO SIGNIFICANT HISTOPATHOLOGIC  CHANGES. - NO MICROSCOPIC COLITIS, ACTIVE INFLAMMATION OR GRANULOMAS.  Capsule endoscopy was attempted but the capsule spent 8 hours in his stomach    Past Medical History:  Diagnosis Date   Acute on chronic renal failure (HCC) 11/25/2015   Anemia    Arthritis    Blind    Left eye   Cataract    CHF (congestive heart failure) (HCC)    Chronic kidney disease    stage 5   COVID-19    Depression    situatuional   Diabetes mellitus    Type II   GERD (gastroesophageal reflux disease)    Graves disease 2014   Headache    in past   Hx of adenomatous and sessile serrated colonic polyps 11/21/2019   Hyperlipidemia    Hypertension    Hypothyroidism    Legally blind    right eye   Neuropathy    Non-compliance    Proteinuria 05/24/2020   Shortness of breath dyspnea    "Better since I've been taking my medications"   Past Surgical History:  Procedure Laterality Date   AV FISTULA PLACEMENT Right 04/22/2019   Procedure: RIGHT ARM ARTERIOVENOUS (AV) FISTULA CREATION;  Surgeon: Dannis Dy, MD;  Location: [facility] OR;  Service: Vascular;  Laterality: Right;   CIRCUMCISION     as a child, around 4 years of age   COLONOSCOPY     EYE SURGERY Bilateral    "several "   KIDNEY TRANSPLANT  01/01/2022   PARS PLANA VITRECTOMY Right 03/25/2016   Procedure: PARS PLANA  VITRECTOMY 25 GAUGE FOR ENDOPHTHALMITIS;  Surgeon: Jearline Minder, MD;  Location: [facility];  Service: Ophthalmology;  Laterality: Right;   WISDOM TOOTH EXTRACTION      reports that he quit smoking about 23 years ago. His smoking use included cigarettes. He started smoking about 26 years ago. He has a 2.3 pack-year smoking history. He has never used smokeless tobacco. He reports that he does not currently use alcohol. He reports current drug use. Drug: Marijuana. family history includes Bladder Cancer in his brother; Diabetes in his mother; Hypertension in his mother; Kidney disease in an other family member. No Known  Allergies    Outpatient Encounter Medications as of 02/21/2024  Medication Sig   Accu-Chek Softclix Lancets lancets Use to check blood sugar once daily. E11.39   acetaminophen  (TYLENOL ) 500 MG tablet Take 1 tablet (500 mg total) by mouth every 6 (six) hours as needed for moderate pain (pain score 4-6) or mild pain (pain score 1-3).   Alcohol Swabs (B-D SINGLE USE SWABS REGULAR) PADS Use as directed.   ammonium lactate  (AMLACTIN DAILY) 12 % lotion Apply 1 Application topically as needed for dry skin.   atorvastatin  (LIPITOR) 40 MG tablet Take 1 tablet (40 mg total) by mouth daily.   Blood Glucose Calibration (MYGLUCOHEALTH CONTROL) SOLN    Blood Glucose Monitoring Suppl (ACCU-CHEK GUIDE) w/Device KIT Use to check blood sugar once daily. E11.39   carvedilol  (COREG ) 12.5 MG tablet Take 12.5 mg by mouth 2 (two) times daily with a meal.   diclofenac  Sodium (VOLTAREN ) 1 % GEL Apply 4 g topically 4 (four) times daily.   dicyclomine  (BENTYL ) 20 MG tablet Take 1 tablet (20 mg total) by mouth 2 (two) times daily as needed for spasms.   diphenoxylate -atropine  (LOMOTIL ) 2.5-0.025 MG tablet Take 1 tablet by mouth 4 (four) times daily as needed for diarrhea or loose stools.   furosemide  (LASIX ) 80 MG tablet Take 1 tablet (80 mg total) by mouth 2 (two) times daily. On the days you DO NOT have dialysis   glipiZIDE  (GLUCOTROL  XL) 10 MG 24 hr tablet Take 1 tablet (10 mg total) by mouth daily.   glucose blood (ACCU-CHEK GUIDE) test strip Use to check blood sugar once daily. E11.39   levothyroxine  (SYNTHROID ) 150 MCG tablet Take 1 tablet (150 mcg total) by mouth daily before breakfast.   mycophenolate (MYFORTIC) 180 MG EC tablet Take 180 mg by mouth 2 (two) times daily.   Na Sulfate-K Sulfate-Mg Sulfate concentrate (SUPREP BOWEL PREP KIT) 17.5-3.13-1.6 GM/177ML SOLN Take 1 kit (354 mLs total) by mouth as directed. For colonoscopy prep   pantoprazole  (PROTONIX ) 40 MG tablet Take 1 tablet (40 mg total) by mouth  daily.   pioglitazone  (ACTOS ) 15 MG tablet Take 1 tablet (15 mg total) by mouth daily.   polyethylene glycol powder (GLYCOLAX /MIRALAX ) 17 GM/SCOOP powder Take as directed as needed for constipation   sildenafil  (VIAGRA ) 100 MG tablet Take 1 tablet (100 mg total) by mouth daily as needed for erectile dysfunction.   tacrolimus  (PROGRAF ) 1 MG capsule Take 1 mg by mouth 2 (two) times daily.   valsartan  (DIOVAN ) 40 MG tablet Take 1 tablet (40 mg total) by mouth daily.   [DISCONTINUED] DULoxetine  (CYMBALTA ) 60 MG capsule Take 1 capsule (60 mg total) by mouth daily. For chronic pain   [DISCONTINUED] IRON SUCROSE IV Iron Sucrose (Venofer)   [DISCONTINUED] metoCLOPramide  (REGLAN ) 10 MG tablet Take 1 tablet (10 mg total) by mouth every 6 (six) hours as needed for nausea (  nausea/headache).   [DISCONTINUED] oxyCODONE  (ROXICODONE ) 5 MG immediate release tablet Take 1 tablet (5 mg total) by mouth every 4 (four) hours as needed for severe pain.   [DISCONTINUED] potassium chloride  SA (KLOR-CON  M) 20 MEQ tablet Take 2 tablets (40 mEq total) by mouth daily for 3 days.   [DISCONTINUED] pregabalin  (LYRICA ) 25 MG capsule Take 1 capsule (25 mg total) by mouth 2 (two) times daily.   [DISCONTINUED] sulfamethoxazole-trimethoprim (BACTRIM) 400-80 MG tablet Take 1 tablet by mouth 3 (three) times a week.   No facility-administered encounter medications on file as of 02/21/2024.    REVIEW OF SYSTEMS  : All other systems reviewed and negative except where noted in the History of Present Illness.   PHYSICAL EXAM: BP 130/80   Ht 6' (1.829 m)   Wt 201 lb (91.2 kg)   BMI 27.26 kg/m  General: Well developed AA male in no acute distress Head: Normocephalic and atraumatic Eyes:  Sclerae anicteric, conjunctiva pink. Ears: Normal auditory acuity Lungs: Clear throughout to auscultation; no W/R/R. Heart: Regular rate and rhythm; no M/R/G. Rectal:  Will be done at the time of colonoscopy. Musculoskeletal: Symmetrical with no  gross deformities  Skin: No lesions on visible extremities Neurological: Alert oriented x 4, grossly non-focal Psychological:  Alert and cooperative. Normal mood and affect  ASSESSMENT AND PLAN: *Early satiety and GERD: Last hemoglobin A1c in January was 10.1.  Blood sugars poorly controlled.  He does not check them regularly like he should.  EGD in 2022 showed some duodenitis, otherwise unremarkable.  Will plan for gastric emptying scan.  Is on pantoprazole  40 mg daily. *Personal history of colon polyps: Last colonoscopy February 2021 with multiple sessile serrated polyps/adenomas.  Repeat recommended in 3 years.  Will schedule with Dr. Willy Harvest.  The risks, benefits, and alternatives to colonoscopy were discussed with the patient and he consents to proceed.  *Diarrhea: Describes frequent diarrhea.  ?  If this is due to medications as some antirejection medications can cause diarrhea.  ?  SIBO related to his diabetes versus diabetic autonomic neuropathy. *Renal transplant  **Please consider random colon biopsies to rule out microscopic colitis as a cause of his diarrhea.   CC:  Joaquin Mulberry, MD

## 2024-02-23 ENCOUNTER — Telehealth: Payer: Self-pay

## 2024-02-27 ENCOUNTER — Ambulatory Visit: Admitting: Internal Medicine

## 2024-02-27 ENCOUNTER — Ambulatory Visit: Payer: 59 | Admitting: Family Medicine

## 2024-02-28 ENCOUNTER — Encounter: Payer: Self-pay | Admitting: Gastroenterology

## 2024-02-28 DIAGNOSIS — K219 Gastro-esophageal reflux disease without esophagitis: Secondary | ICD-10-CM | POA: Insufficient documentation

## 2024-02-28 DIAGNOSIS — K529 Noninfective gastroenteritis and colitis, unspecified: Secondary | ICD-10-CM | POA: Insufficient documentation

## 2024-02-28 DIAGNOSIS — R6881 Early satiety: Secondary | ICD-10-CM | POA: Insufficient documentation

## 2024-03-01 ENCOUNTER — Encounter (HOSPITAL_COMMUNITY): Payer: Self-pay

## 2024-03-05 ENCOUNTER — Telehealth: Payer: Self-pay | Admitting: *Deleted

## 2024-03-05 NOTE — Progress Notes (Signed)
 Complex Care Management Care Guide Note  03/05/2024 Name: ANGELL HONSE Sr. MRN: [id number] DOB: 1971-09-23  Gayleen Kawasaki Sr. is a 53 y.o. year old male who is a primary care patient of Newlin, Enobong, MD and is actively engaged with the care management team. I reached out to Gayleen Kawasaki Sr. by phone today to assist with re-scheduling  with the RN Case Manager BSW.  Follow up plan: Unsuccessful telephone outreach attempt made. A HIPAA compliant phone message was left for the patient providing contact information and requesting a return call.No further outreach attempts will be made at this time. We have been unable to contact the patient to reschedule for complex care management services.   Barnie Bora  [facility]  [facility] Guide  Direct Dial: [phone]  Fax [phone]

## 2024-03-06 ENCOUNTER — Encounter: Payer: Self-pay | Admitting: Family Medicine

## 2024-03-06 ENCOUNTER — Ambulatory Visit: Attending: Family Medicine | Admitting: Family Medicine

## 2024-03-06 VITALS — BP 160/82 | HR 70 | Ht 73.0 in | Wt 204.0 lb

## 2024-03-06 DIAGNOSIS — Z940 Kidney transplant status: Secondary | ICD-10-CM

## 2024-03-06 DIAGNOSIS — E1169 Type 2 diabetes mellitus with other specified complication: Secondary | ICD-10-CM

## 2024-03-06 DIAGNOSIS — E1122 Type 2 diabetes mellitus with diabetic chronic kidney disease: Secondary | ICD-10-CM

## 2024-03-06 DIAGNOSIS — R197 Diarrhea, unspecified: Secondary | ICD-10-CM | POA: Diagnosis not present

## 2024-03-06 DIAGNOSIS — E890 Postprocedural hypothyroidism: Secondary | ICD-10-CM | POA: Diagnosis not present

## 2024-03-06 DIAGNOSIS — I129 Hypertensive chronic kidney disease with stage 1 through stage 4 chronic kidney disease, or unspecified chronic kidney disease: Secondary | ICD-10-CM | POA: Diagnosis not present

## 2024-03-06 DIAGNOSIS — Z7984 Long term (current) use of oral hypoglycemic drugs: Secondary | ICD-10-CM | POA: Diagnosis not present

## 2024-03-06 DIAGNOSIS — E785 Hyperlipidemia, unspecified: Secondary | ICD-10-CM

## 2024-03-06 DIAGNOSIS — E1139 Type 2 diabetes mellitus with other diabetic ophthalmic complication: Secondary | ICD-10-CM | POA: Diagnosis not present

## 2024-03-06 LAB — POCT GLYCOSYLATED HEMOGLOBIN (HGB A1C): HbA1c, POC (controlled diabetic range): 8.5 % — AB (ref 0.0–7.0)

## 2024-03-06 MED ORDER — CARVEDILOL 12.5 MG PO TABS: 12.5000 mg | ORAL_TABLET | Freq: Two times a day (BID) | ORAL | 1 refills | Status: AC

## 2024-03-06 MED ORDER — VALSARTAN 40 MG PO TABS: 40.0000 mg | ORAL_TABLET | Freq: Every day | ORAL | 1 refills | Status: AC

## 2024-03-06 MED ORDER — DICYCLOMINE HCL 20 MG PO TABS
20.0000 mg | ORAL_TABLET | Freq: Two times a day (BID) | ORAL | 2 refills | Status: AC | PRN
Start: 2024-03-06 — End: ?

## 2024-03-06 MED ORDER — ATORVASTATIN CALCIUM 40 MG PO TABS: 40.0000 mg | ORAL_TABLET | Freq: Every day | ORAL | 1 refills | Status: AC

## 2024-03-06 MED ORDER — PIOGLITAZONE HCL 30 MG PO TABS: 30.0000 mg | ORAL_TABLET | Freq: Every day | ORAL | 1 refills | Status: AC

## 2024-03-06 MED ORDER — GLIPIZIDE ER 10 MG PO TB24
10.0000 mg | ORAL_TABLET | Freq: Every day | ORAL | 1 refills | Status: DC
Start: 2024-03-06 — End: 2024-05-16

## 2024-03-06 NOTE — Patient Instructions (Signed)
 VISIT SUMMARY:  You came in today for a follow-up on your blood sugar control and diarrhea. Your hemoglobin A1c has improved from 10.1% to 8.5%. We discussed your medications and the worsening of your diarrhea. You have not yet completed the stool test that was previously ordered. We also reviewed your blood pressure and thyroid  levels.  YOUR PLAN:  -DIARRHEA: You have chronic diarrhea that has been getting worse. This could be due to several reasons, including inflammatory bowel disease or celiac disease. We are waiting for your colonoscopy results and need you to complete the stool tests for calprotectin and pancreatic enzyme levels. Please restart taking dicyclomine  20 mg twice a day as needed to help with the symptoms.  -TYPE 2 DIABETES MELLITUS: Your blood sugar control has improved, but it is still above the target level. We are increasing your Actos  dose to 30 mg daily and continuing your glipizide  10 mg daily. This should help improve your blood sugar levels further. We will continue to monitor your A1c levels.  -HYPERTENSION: Your blood pressure is elevated. We will reassess it in three months and may consider adding a new medication called amlodipine  if it remains high. Currently, you are taking carvedilol .  -HYPOTHYROIDISM: Your thyroid  levels are high, likely due to inconsistent use of your medication, levothyroxine . It is important to take it on an empty stomach, ideally in the morning. To help you remember, try placing the medication by your toothbrush or in your work bag.  INSTRUCTIONS:  Please complete the stool tests for calprotectin and pancreatic enzyme levels as soon as possible. We will also need a blood test for celiac disease. Continue taking your medications as prescribed and follow the strategies discussed to help remember your levothyroxine . We will reassess your blood pressure in three months. Await the results of your colonoscopy and submit a stool specimen as soon as  possible.

## 2024-03-06 NOTE — Progress Notes (Signed)
 Subjective:  Patient ID: Edward Kawasaki Sr., male    DOB: 1970/10/05  Age: 53 y.o. MRN: [id number]  CC: Medical Management of Chronic Issues (No concerns)     Discussed the use of AI scribe software for clinical note transcription with the patient, who gave verbal consent to proceed.  History of Present Illness Edward Tugwell. is a 53 year old male with with a history of hypertension, type 2 diabetes mellitus, Graves' disease (status post radioactive iodine treatment in 07/2017), ESRD (status post renal transplant at [facility] on 01/06/2022), erectile dysfunction, previous bilateral lower extremity DVT (completed anticoagulation)  who presents for follow-up of blood sugar control and diarrhea.  His hemoglobin A1c has improved from 10.1% in January to 8.5% currently. He is on glipizide  10 mg daily and Actos  15 mg. He experiences worsening diarrhea and is unsure if it is related to his medication. He has a history of diarrhea, which has recently worsened, and experiences abdominal pain associated with it. He was prescribed dicyclomine  but has not resumed it despite the recurrence of symptoms. He has not completed a stool test previously ordered.  Furred to GI and he is awaiting a colonoscopy later this month.   He takes levothyroxine  in the morning on an empty stomach but sometimes forgets due to his early work schedule.  Last TSH was elevated.  He is also on carvedilol  12.5 mg, valsartan  40 mg and atorvastatin , with refills managed by his pharmacy.  His blood pressure is slightly elevated and upon repeat blood pressure was high and he attributes this to pain.  He does not check his blood pressures at home.  He works early hours, starting his day at 3:00 AM.  He continues to follow-up with the renal transplant team at [facility].  Past Medical History:  Diagnosis Date   Acute on chronic renal failure (HCC) 11/25/2015   Anemia    Arthritis    Blind    Left eye    Cataract    CHF (congestive heart failure) (HCC)    Chronic kidney disease    stage 5   COVID-19    Depression    situatuional   Diabetes mellitus    Type II   GERD (gastroesophageal reflux disease)    Graves disease 2014   Headache    in past   Hx of adenomatous and sessile serrated colonic polyps 11/21/2019   Hyperlipidemia    Hypertension    Hypothyroidism    Legally blind    right eye   Neuropathy    Non-compliance    Proteinuria 05/24/2020   Shortness of breath dyspnea    "Better since I've been taking my medications"    Past Surgical History:  Procedure Laterality Date   AV FISTULA PLACEMENT Right 04/22/2019   Procedure: RIGHT ARM ARTERIOVENOUS (AV) FISTULA CREATION;  Surgeon: Dannis Dy, MD;  Location: [facility] OR;  Service: Vascular;  Laterality: Right;   CIRCUMCISION     as a child, around 52 years of age   COLONOSCOPY     EYE SURGERY Bilateral    "several "   KIDNEY TRANSPLANT  01/01/2022   PARS PLANA VITRECTOMY Right 03/25/2016   Procedure: PARS PLANA VITRECTOMY 25 GAUGE FOR ENDOPHTHALMITIS;  Surgeon: Jearline Minder, MD;  Location: [facility] OR;  Service: Ophthalmology;  Laterality: Right;   WISDOM TOOTH EXTRACTION      Family History  Problem Relation Age of Onset   Hypertension Mother  Diabetes Mother    Bladder Cancer Brother    Kidney disease Other    Colon cancer Neg Hx    Rectal cancer Neg Hx    Esophageal cancer Neg Hx    Stomach cancer Neg Hx     Social History   Socioeconomic History   Marital status: Divorced    Spouse name: Not on file   Number of children: 11   Years of education: Not on file   Highest education level: Not on file  Occupational History   Occupation: disabled  Tobacco Use   Smoking status: Former    Current packs/day: 0.00    Average packs/day: 0.8 packs/day for 3.0 years (2.3 ttl pk-yrs)    Types: Cigarettes    Start date: 05/28/1997    Quit date: 05/28/2000    Years since quitting: 23.7   Smokeless tobacco:  Never  Vaping Use   Vaping status: Never Used  Substance and Sexual Activity   Alcohol use: Not Currently    Alcohol/week: 0.0 standard drinks of alcohol    Comment: 1 beer  ocassional   Drug use: Yes    Types: Marijuana    Comment: daily for pain   Sexual activity: Yes  Other Topics Concern   Not on file  Social History Narrative   Divorced   11 children   85 yo son with him at home   Disabled chef - cafeterias, Cytogeneticist, [employer], etc      Former cigarette smoker, marijuana now, rare alcohol, other drugs   Social Drivers of Corporate investment banker Strain: High Risk (12/13/2023)   Overall Financial Resource Strain (CARDIA)    Difficulty of Paying Living Expenses: Hard  Food Insecurity: Low Risk  (01/15/2024)   Received from [facility]   Hunger Vital Sign    Worried About Running Out of Food in the Last Year: Never true    Ran Out of Food in the Last Year: Never true  Recent Concern: Food Insecurity - Food Insecurity Present (12/25/2023)   Hunger Vital Sign    Worried About Running Out of Food in the Last Year: Sometimes true    Ran Out of Food in the Last Year: Sometimes true  Transportation Needs: No Transportation Needs (01/15/2024)   Received from [facility]    In the past 12 months, has lack of reliable transportation kept you from medical appointments, meetings, work or from getting things needed for daily living? : No  Recent Concern: Transportation Needs - Unmet Transportation Needs (12/05/2023)   PRAPARE - Transportation    Lack of Transportation (Medical): Yes    Lack of Transportation (Non-Medical): Yes  Physical Activity: Insufficiently Active (12/05/2023)   Exercise Vital Sign    Days of Exercise per Week: 3 days    Minutes of Exercise per Session: 30 min  Stress: Stress Concern Present (12/05/2023)   [employer] of Occupational Health - Occupational Stress Questionnaire    Feeling of Stress : Rather much  Social Connections:  Socially Isolated (12/05/2023)   Social Connection and Isolation Panel [NHANES]    Frequency of Communication with Friends and Family: More than three times a week    Frequency of Social Gatherings with Friends and Family: Once a week    Attends Religious Services: Never    Database administrator or Organizations: No    Attends Banker Meetings: Never    Marital Status: Divorced    No Known Allergies  Outpatient Medications Prior to Visit  Medication Sig Dispense Refill   Accu-Chek Softclix Lancets lancets Use to check blood sugar once daily. E11.39 100 each 2   acetaminophen  (TYLENOL ) 500 MG tablet Take 1 tablet (500 mg total) by mouth every 6 (six) hours as needed for moderate pain (pain score 4-6) or mild pain (pain score 1-3). 100 tablet 1   Alcohol Swabs (B-D SINGLE USE SWABS REGULAR) PADS Use as directed. 100 each 6   ammonium lactate  (AMLACTIN DAILY) 12 % lotion Apply 1 Application topically as needed for dry skin. 400 g 1   Blood Glucose Calibration (MYGLUCOHEALTH CONTROL) SOLN      Blood Glucose Monitoring Suppl (ACCU-CHEK GUIDE) w/Device KIT Use to check blood sugar once daily. E11.39 1 kit 0   diclofenac  Sodium (VOLTAREN ) 1 % GEL Apply 4 g topically 4 (four) times daily. 100 g 1   diphenoxylate -atropine  (LOMOTIL ) 2.5-0.025 MG tablet Take 1 tablet by mouth 4 (four) times daily as needed for diarrhea or loose stools. 30 tablet 0   furosemide  (LASIX ) 80 MG tablet Take 1 tablet (80 mg total) by mouth 2 (two) times daily. On the days you DO NOT have dialysis 34 tablet 2   glucose blood (ACCU-CHEK GUIDE) test strip Use to check blood sugar once daily. E11.39 100 each 2   levothyroxine  (SYNTHROID ) 150 MCG tablet Take 1 tablet (150 mcg total) by mouth daily before breakfast. 90 tablet 1   mycophenolate (MYFORTIC) 180 MG EC tablet Take 180 mg by mouth 2 (two) times daily.     Na Sulfate-K Sulfate-Mg Sulfate concentrate (SUPREP BOWEL PREP KIT) 17.5-3.13-1.6 GM/177ML SOLN Take 1  kit (354 mLs total) by mouth as directed. For colonoscopy prep 354 mL 0   pantoprazole  (PROTONIX ) 40 MG tablet Take 1 tablet (40 mg total) by mouth daily. 30 tablet 2   polyethylene glycol powder (GLYCOLAX /MIRALAX ) 17 GM/SCOOP powder Take as directed as needed for constipation 850 g 1   sildenafil  (VIAGRA ) 100 MG tablet Take 1 tablet (100 mg total) by mouth daily as needed for erectile dysfunction. 10 tablet 1   tacrolimus  (PROGRAF ) 1 MG capsule Take 1 mg by mouth 2 (two) times daily.     atorvastatin  (LIPITOR) 40 MG tablet Take 1 tablet (40 mg total) by mouth daily. 90 tablet 1   carvedilol  (COREG ) 12.5 MG tablet Take 12.5 mg by mouth 2 (two) times daily with a meal.     dicyclomine  (BENTYL ) 20 MG tablet Take 1 tablet (20 mg total) by mouth 2 (two) times daily as needed for spasms. 60 tablet 2   glipiZIDE  (GLUCOTROL  XL) 10 MG 24 hr tablet Take 1 tablet (10 mg total) by mouth daily. 90 tablet 1   pioglitazone  (ACTOS ) 15 MG tablet Take 1 tablet (15 mg total) by mouth daily. 90 tablet 1   valsartan  (DIOVAN ) 40 MG tablet Take 1 tablet (40 mg total) by mouth daily. 90 tablet 1   No facility-administered medications prior to visit.     ROS Review of Systems  Constitutional:  Negative for activity change and appetite change.  HENT:  Negative for sinus pressure and sore throat.   Respiratory:  Negative for chest tightness, shortness of breath and wheezing.   Cardiovascular:  Negative for chest pain and palpitations.  Gastrointestinal:  Positive for diarrhea. Negative for abdominal distention, abdominal pain and constipation.  Genitourinary: Negative.   Musculoskeletal:  Positive for arthralgias.  Psychiatric/Behavioral:  Negative for behavioral problems and dysphoric mood.  Objective:  BP (!) 160/82   Pulse 70   Ht 6\' 1"  (1.854 m)   Wt 204 lb (92.5 kg)   SpO2 100%   BMI 26.91 kg/m      03/06/2024    2:08 PM 03/06/2024    1:32 PM 02/21/2024    3:08 PM  BP/Weight  Systolic BP 160 146  130  Diastolic BP 82 77 80  Wt. (Lbs)  204 201  BMI  26.91 kg/m2 27.26 kg/m2      Physical Exam Constitutional:      Appearance: He is well-developed.  Cardiovascular:     Rate and Rhythm: Normal rate.     Heart sounds: Normal heart sounds. No murmur heard. Pulmonary:     Effort: Pulmonary effort is normal.     Breath sounds: Normal breath sounds. No wheezing or rales.  Chest:     Chest wall: No tenderness.  Abdominal:     General: Bowel sounds are normal. There is no distension.     Palpations: Abdomen is soft. There is no mass.     Tenderness: There is no abdominal tenderness.  Musculoskeletal:        General: Normal range of motion.     Right lower leg: No edema.     Left lower leg: No edema.  Neurological:     Mental Status: He is alert and oriented to person, place, and time.  Psychiatric:        Mood and Affect: Mood normal.        Latest Ref Rng & Units 11/01/2023   11:38 AM 12/23/2022    9:47 PM 12/19/2022    3:12 PM  CMP  Glucose 70 - 99 mg/dL 696  295  284   BUN 6 - 24 mg/dL 22  40  19   Creatinine 0.76 - 1.27 mg/dL 1.32  4.40  1.02   Sodium 134 - 144 mmol/L 136  129  136   Potassium 3.5 - 5.2 mmol/L 4.9  3.6  4.3   Chloride 96 - 106 mmol/L 105  97  108   CO2 20 - 29 mmol/L 18  21  21    Calcium  8.7 - 10.2 mg/dL 9.4  8.9  9.6   Total Protein 6.0 - 8.5 g/dL 6.7  7.5  8.8   Total Bilirubin 0.0 - 1.2 mg/dL 0.5  1.8  1.3   Alkaline Phos 44 - 121 IU/L 172  78  108   AST 0 - 40 IU/L 24  27  27    ALT 0 - 44 IU/L 51  23  30     Lipid Panel     Component Value Date/Time   CHOL 137 10/21/2019 0854   TRIG 116 10/21/2019 0854   HDL 55 10/21/2019 0854   CHOLHDL 2.5 10/21/2019 0854   CHOLHDL 2.8 09/19/2016 1023   VLDL 37 (H) 09/19/2016 1023   LDLCALC 61 10/21/2019 0854    CBC    Component Value Date/Time   WBC 6.4 11/01/2023 1138   WBC 8.0 12/23/2022 2147   RBC 3.94 (L) 11/01/2023 1138   RBC 4.68 12/23/2022 2147   HGB 12.8 (L) 11/01/2023 1138   HCT  37.4 (L) 11/01/2023 1138   PLT 166 11/01/2023 1138   MCV 95 11/01/2023 1138   MCH 32.5 11/01/2023 1138   MCH 32.3 12/23/2022 2147   MCHC 34.2 11/01/2023 1138   MCHC 36.0 12/23/2022 2147   RDW 13.0 11/01/2023 1138   LYMPHSABS 0.9 11/01/2023 1138  MONOABS 1.0 12/23/2022 2147   EOSABS 0.0 11/01/2023 1138   BASOSABS 0.0 11/01/2023 1138    Lab Results  Component Value Date   HGBA1C 8.5 (A) 03/06/2024   Lab Results  Component Value Date   HGBA1C 8.5 (A) 03/06/2024   HGBA1C 10.1 (H) 11/01/2023   HGBA1C 6.1 08/29/2023    Lab Results  Component Value Date   TSH 23.300 (H) 08/29/2023       1. Type 2 diabetes mellitus with other ophthalmic complication, without long-term current use of insulin  (HCC) (Primary) Uncontrolled with A1c of 8.5 Increase Actos  from 15 mg to 30 mg, continue glipizide  He is hesitant about adding insulin  Counseled on Diabetic diet, my plate method, 956 minutes of moderate intensity exercise/week Blood sugar logs with fasting goals of 80-120 mg/dl, random of less than 213 and in the event of sugars less than 60 mg/dl or greater than 086 mg/dl encouraged to notify the clinic. Advised on the need for annual eye exams, annual foot exams, Pneumonia vaccine. - POCT glycosylated hemoglobin (Hb A1C) - pioglitazone  (ACTOS ) 30 MG tablet; Take 1 tablet (30 mg total) by mouth daily.  Dispense: 90 tablet; Refill: 1 - glipiZIDE  (GLUCOTROL  XL) 10 MG 24 hr tablet; Take 1 tablet (10 mg total) by mouth daily.  Dispense: 90 tablet; Refill: 1  2. Hyperlipidemia associated with type 2 diabetes mellitus (HCC) Controlled Low-cholesterol diet - atorvastatin  (LIPITOR) 40 MG tablet; Take 1 tablet (40 mg total) by mouth daily.  Dispense: 90 tablet; Refill: 1  3. Long term current use of oral hypoglycemic drug - pioglitazone  (ACTOS ) 30 MG tablet; Take 1 tablet (30 mg total) by mouth daily.  Dispense: 90 tablet; Refill: 1 - glipiZIDE  (GLUCOTROL  XL) 10 MG 24 hr tablet; Take 1 tablet  (10 mg total) by mouth daily.  Dispense: 90 tablet; Refill: 1  4. Postablative hypothyroidism Uncontrolled Medication nonadherence contributing We have discussed strategies to aid adherence - T4, free - TSH - T3  5. Diarrhea, unspecified type Will need to evaluate for inflammatory bowel disease but he unfortunately has been unable to provide a stool specimen Keep upcoming appointment for colonoscopy Resume Bentyl  - Celiac Disease Panel - dicyclomine  (BENTYL ) 20 MG tablet; Take 1 tablet (20 mg total) by mouth 2 (two) times daily as needed for spasms.  Dispense: 60 tablet; Refill: 2  6. Renal transplant recipient Continue to follow-up with transplant team at [facility]  7. Hypertension in chronic kidney disease due to type 2 diabetes mellitus (HCC) Uncontrolled He is hesitant about adding another antihypertensive Last month his blood pressure was normal surprisingly He is attributing elevated blood pressure to pain Will reassess at next visit and consider increasing dose of valsartan  if still elevated Counseled on blood pressure goal of less than 130/80, low-sodium, DASH diet, medication compliance, 150 minutes of moderate intensity exercise per week. Discussed medication compliance, adverse effects. - carvedilol  (COREG ) 12.5 MG tablet; Take 1 tablet (12.5 mg total) by mouth 2 (two) times daily with a meal.  Dispense: 180 tablet; Refill: 1 - valsartan  (DIOVAN ) 40 MG tablet; Take 1 tablet (40 mg total) by mouth daily.  Dispense: 90 tablet; Refill: 1   Meds ordered this encounter  Medications   pioglitazone  (ACTOS ) 30 MG tablet    Sig: Take 1 tablet (30 mg total) by mouth daily.    Dispense:  90 tablet    Refill:  1    Dose increase   carvedilol  (COREG ) 12.5 MG tablet    Sig: Take  1 tablet (12.5 mg total) by mouth 2 (two) times daily with a meal.    Dispense:  180 tablet    Refill:  1   atorvastatin  (LIPITOR) 40 MG tablet    Sig: Take 1 tablet (40 mg total) by mouth daily.     Dispense:  90 tablet    Refill:  1   dicyclomine  (BENTYL ) 20 MG tablet    Sig: Take 1 tablet (20 mg total) by mouth 2 (two) times daily as needed for spasms.    Dispense:  60 tablet    Refill:  2   glipiZIDE  (GLUCOTROL  XL) 10 MG 24 hr tablet    Sig: Take 1 tablet (10 mg total) by mouth daily.    Dispense:  90 tablet    Refill:  1   valsartan  (DIOVAN ) 40 MG tablet    Sig: Take 1 tablet (40 mg total) by mouth daily.    Dispense:  90 tablet    Refill:  1    Follow-up: Return in about 3 months (around 06/06/2024) for Chronic medical conditions.       Joaquin Mulberry, MD, FAAFP. [facility] [place], [place] [phone]   03/06/2024, 2:23 PM

## 2024-03-07 ENCOUNTER — Ambulatory Visit: Payer: Self-pay | Admitting: Family Medicine

## 2024-03-07 DIAGNOSIS — E0500 Thyrotoxicosis with diffuse goiter without thyrotoxic crisis or storm: Secondary | ICD-10-CM

## 2024-03-07 MED ORDER — LEVOTHYROXINE SODIUM 175 MCG PO TABS
175.0000 ug | ORAL_TABLET | Freq: Every day | ORAL | 1 refills | Status: DC
Start: 2024-03-07 — End: 2024-06-07

## 2024-03-10 ENCOUNTER — Telehealth: Payer: Self-pay | Admitting: Gastroenterology

## 2024-03-10 LAB — CELIAC DISEASE PANEL
Endomysial IgA: NEGATIVE
IgA/Immunoglobulin A, Serum: 209 mg/dL (ref 90–386)
Transglutaminase IgA: <2 U/mL (ref 0–3)

## 2024-03-10 LAB — TSH: TSH: 18.8 u[IU]/mL — ABNORMAL HIGH (ref 0.450–4.500)

## 2024-03-10 LAB — T4, FREE: Free T4: 0.76 ng/dL — ABNORMAL LOW (ref 0.82–1.77)

## 2024-03-10 LAB — T3: T3, Total: 47 ng/dL — ABNORMAL LOW (ref 71–180)

## 2024-03-10 NOTE — Telephone Encounter (Signed)
 Received page to on call. Patient forgot about colonoscopy that is scheduled for tomorrow. Has been eating solid food all day. Not sure where his prep or instructions are. Discussed possibility of starting prep now, but he would like to cancel tomorrow's procedure and reschedule. Will get message to Dr. Willy Harvest to coordinate.

## 2024-03-11 ENCOUNTER — Ambulatory Visit (HOSPITAL_COMMUNITY): Admission: RE | Admit: 2024-03-11 | Source: Ambulatory Visit

## 2024-03-11 MED ORDER — NA SULFATE-K SULFATE-MG SULF 17.5-3.13-1.6 GM/177ML PO SOLN: 1.0000 | ORAL | 0 refills | Status: DC

## 2024-03-11 NOTE — Telephone Encounter (Signed)
 Patient does not have colonoscopy tomorrow it is 7/8 He has gastric emptying scan 6/13  He needs reminders about these and also prep instruction help - looks like a pre-visit would be best

## 2024-03-11 NOTE — Addendum Note (Signed)
 Addended by: Laurie Poplar on: 03/11/2024 11:07 AM   Modules accepted: Orders

## 2024-03-11 NOTE — Telephone Encounter (Signed)
 Called patient. Discussed dates. Patient requests to have instructions mailed to him. Printed and placed in mail at this time. Will schedule a pre-visit phone call for patient to ensure instructions were received and followed. Patient states he did not pick up Suprep from pharmacy. Verified correct pharmacy and resent Rx.

## 2024-03-15 ENCOUNTER — Ambulatory Visit (HOSPITAL_COMMUNITY)
Admission: RE | Admit: 2024-03-15 | Discharge: 2024-03-15 | Disposition: A | Discharge status: Home / Self Care | Source: Ambulatory Visit | Attending: Gastroenterology | Admitting: Gastroenterology

## 2024-03-15 DIAGNOSIS — R140 Abdominal distension (gaseous): Secondary | ICD-10-CM | POA: Diagnosis not present

## 2024-03-15 DIAGNOSIS — K529 Noninfective gastroenteritis and colitis, unspecified: Secondary | ICD-10-CM | POA: Insufficient documentation

## 2024-03-15 DIAGNOSIS — Z860100 Personal history of colon polyps, unspecified: Secondary | ICD-10-CM | POA: Diagnosis not present

## 2024-03-15 DIAGNOSIS — R6881 Early satiety: Secondary | ICD-10-CM | POA: Diagnosis not present

## 2024-03-15 DIAGNOSIS — K219 Gastro-esophageal reflux disease without esophagitis: Secondary | ICD-10-CM | POA: Diagnosis not present

## 2024-03-15 MED ORDER — TECHNETIUM TC 99M SULFUR COLLOID
2.2000 | Freq: Once | INTRAVENOUS | Status: AC
  Administered 2024-03-15: 2.2 via ORAL

## 2024-03-20 ENCOUNTER — Ambulatory Visit: Payer: Self-pay | Admitting: Gastroenterology

## 2024-03-22 ENCOUNTER — Encounter (HOSPITAL_COMMUNITY): Payer: Self-pay

## 2024-03-27 ENCOUNTER — Telehealth: Payer: Self-pay

## 2024-03-27 ENCOUNTER — Encounter

## 2024-03-27 NOTE — Telephone Encounter (Signed)
 Unable to reach patient. Unable to leave VM inbox is full. Pt will need to call the office back by 5 PM to have PV rescheduled to avoid cancellation of upcoming colonoscopy.

## 2024-04-02 ENCOUNTER — Ambulatory Visit: Admitting: Family Medicine

## 2024-04-04 ENCOUNTER — Encounter: Payer: Self-pay | Admitting: Internal Medicine

## 2024-04-08 ENCOUNTER — Ambulatory Visit: Admitting: Pharmacist

## 2024-04-09 ENCOUNTER — Encounter: Admitting: Internal Medicine

## 2024-04-24 ENCOUNTER — Encounter: Payer: Self-pay | Admitting: Podiatry

## 2024-04-24 ENCOUNTER — Ambulatory Visit (INDEPENDENT_AMBULATORY_CARE_PROVIDER_SITE_OTHER): Admitting: Podiatry

## 2024-04-24 DIAGNOSIS — E1142 Type 2 diabetes mellitus with diabetic polyneuropathy: Secondary | ICD-10-CM | POA: Diagnosis not present

## 2024-04-24 DIAGNOSIS — B351 Tinea unguium: Secondary | ICD-10-CM | POA: Diagnosis not present

## 2024-04-24 NOTE — Progress Notes (Signed)
 This patient returns to my office for at risk foot care.  This patient requires this care by a professional since this patient will be at risk due to having ESRD, Blind,   and DM with polyneuropathy.  This patient is unable to cut nails himself since the patient cannot reach his nails.These nails are painful walking and wearing shoes.  Patient has kidney transplant.  Patient also has callus developing and occasional hurting  right big toe.This patient presents for at risk foot care today.  General Appearance  Alert, conversant and in no acute stress.  Vascular  Dorsalis pedis and posterior tibial  pulses are  weakly palpable  bilaterally.  Capillary return is within normal limits  bilaterally. Cold feet. bilaterally.  Neurologic  Senn-Weinstein monofilament wire test diminished  bilaterally. Muscle power within normal limits bilaterally.  Nails Thick disfigured discolored nails with subungual debris  from hallux to fifth toes bilaterally. No evidence of bacterial infection or drainage bilaterally.   Orthopedic  No limitations of motion  feet .  No crepitus or effusions noted.  No bony pathology or digital deformities noted.  HAV  B/L. t  Skin  normotropic skin with no porokeratosis noted bilaterally.  No signs of infections or ulcers noted.  Callus distal aspect right hallux.   Onychomycosis  Pain in right toes  Pain in left toes  Consent was obtained for treatment procedures.   Mechanical debridement of nails 1-5  bilaterally performed with a nail nipper.  Filed with dremel without incident.    Return office visit  3 months                    Told patient to return for periodic foot care and evaluation due to potential at risk complications.   Cordella Bold DPM

## 2024-04-30 ENCOUNTER — Ambulatory Visit: Admitting: Pharmacist

## 2024-05-15 DIAGNOSIS — I770 Arteriovenous fistula, acquired: Secondary | ICD-10-CM | POA: Diagnosis not present

## 2024-05-15 DIAGNOSIS — I129 Hypertensive chronic kidney disease with stage 1 through stage 4 chronic kidney disease, or unspecified chronic kidney disease: Secondary | ICD-10-CM | POA: Diagnosis not present

## 2024-05-15 DIAGNOSIS — N2581 Secondary hyperparathyroidism of renal origin: Secondary | ICD-10-CM | POA: Diagnosis not present

## 2024-05-15 DIAGNOSIS — E1129 Type 2 diabetes mellitus with other diabetic kidney complication: Secondary | ICD-10-CM | POA: Diagnosis not present

## 2024-05-15 DIAGNOSIS — Z940 Kidney transplant status: Secondary | ICD-10-CM | POA: Diagnosis not present

## 2024-05-15 DIAGNOSIS — E872 Acidosis, unspecified: Secondary | ICD-10-CM | POA: Diagnosis not present

## 2024-05-15 DIAGNOSIS — E559 Vitamin D deficiency, unspecified: Secondary | ICD-10-CM | POA: Diagnosis not present

## 2024-05-15 DIAGNOSIS — D631 Anemia in chronic kidney disease: Secondary | ICD-10-CM | POA: Diagnosis not present

## 2024-05-15 DIAGNOSIS — Z79899 Other long term (current) drug therapy: Secondary | ICD-10-CM | POA: Diagnosis not present

## 2024-05-15 DIAGNOSIS — E1122 Type 2 diabetes mellitus with diabetic chronic kidney disease: Secondary | ICD-10-CM | POA: Diagnosis not present

## 2024-05-15 DIAGNOSIS — N189 Chronic kidney disease, unspecified: Secondary | ICD-10-CM | POA: Diagnosis not present

## 2024-05-15 DIAGNOSIS — N1831 Chronic kidney disease, stage 3a: Secondary | ICD-10-CM | POA: Diagnosis not present

## 2024-05-16 ENCOUNTER — Encounter

## 2024-05-16 ENCOUNTER — Ambulatory Visit: Attending: Family Medicine | Admitting: Pharmacist

## 2024-05-16 ENCOUNTER — Telehealth: Payer: Self-pay

## 2024-05-16 ENCOUNTER — Encounter: Payer: Self-pay | Admitting: Pharmacist

## 2024-05-16 DIAGNOSIS — E1139 Type 2 diabetes mellitus with other diabetic ophthalmic complication: Secondary | ICD-10-CM | POA: Diagnosis not present

## 2024-05-16 DIAGNOSIS — Z7984 Long term (current) use of oral hypoglycemic drugs: Secondary | ICD-10-CM

## 2024-05-16 MED ORDER — GLIPIZIDE ER 5 MG PO TB24: 5.0000 mg | ORAL_TABLET | Freq: Every day | ORAL | 1 refills | Status: AC

## 2024-05-16 NOTE — Telephone Encounter (Signed)
 Patient was called twice for PV. No answer. A message was left to call before 5:00 today to reschedule. If patient doesn't call before 5:00 a no show letter will be mailed and procedure cancelled per [facility] guidelines.

## 2024-05-16 NOTE — Progress Notes (Signed)
 S:     No chief complaint on file.  53 y.o. male who presents for diabetes evaluation, education, and management. Patient arrives in good spirits and presents without  any assistance. PMH is significant for HTN, T2DM, ESRD s/p renal transplant at [facility] 01/06/22, hx of bilateral DVT (completed anticoagulation).   Patient was referred and last seen by Primary Care Provider, Dr. Delbert, on 03/06/2024. At that appointment, A1C improved from 10.1% to 8.5%. Pioglitazone  dose increased from 15 to 30 mg daily. Pharmacy has discussed LIBERATE with him before but he prefers not to use CGM at this time. He does not like to use a lot of medication.  Today, patient reports that he is doing ok. He is taking pioglitazone  and glipizide  for his diabetes as prescribed. He checks his blood sugars seldomly at home. Tells me his last check was a couple of days ago and was 102 mg/dL. Of note he works and tells me he feels shaky and lightheaded at work frequently. Does not check his sugar at work but these episodes occur several times a week. Continues to endorse several stressors in his life currently.  Current diabetes medications include: glipizide  XL 10 mg daily, pioglitazone  30 mg daily Current hypertension medications include: carvedilol  12.5 mg BID, valsartan  40 mg daily Current hyperlipidemia medications include: atorvastatin  40 mg daily  Patient reports adherence to taking all medications as prescribed.  Insurance coverage: [employer] Dual Complete  Patient reports hypoglycemic events as summarized above. Does not have any objective values but tells me he feels shaky and lightheaded at work several times a week and will have to eat to feel better.   Reported home fasting blood sugars:  -Last checked a couple days ago: 102 mg/dL  Reported 2 hour post-meal/random blood sugars: none.  Patient denies nocturia (nighttime urination).  Patient reports neuropathy (nerve pain) - stable from baseline.  Patient  reports visual changes - stable.  Patient denies polyphagia, polydipsia. Patient reports self foot exams.   Patient reported dietary habits: Eats 0-1 meals/day - always eats vegetables with meal, protein. Reports eating rice regularly. Has been eating more fried food over the past few months. Drinks: Drinks water throughout the day, 1-2 sodas 2-3 days per week - tries not to drink soda more than 2 times.   Patient-reported exercise habits: stays on his feet all day at work, walking around throughout the day.   O:   ROS  Physical Exam  BP Readings from Last 3 Encounters:  03/06/24 (!) 160/82  02/21/24 130/80  01/29/24 (!) 149/80   Lab Results  Component Value Date   HGBA1C 8.5 (A) 03/06/2024   There were no vitals filed for this visit.   Lipid Panel     Component Value Date/Time   CHOL 137 10/21/2019 0854   TRIG 116 10/21/2019 0854   HDL 55 10/21/2019 0854   CHOLHDL 2.5 10/21/2019 0854   CHOLHDL 2.8 09/19/2016 1023   VLDL 37 (H) 09/19/2016 1023   LDLCALC 61 10/21/2019 0854    Clinical Atherosclerotic Cardiovascular Disease (ASCVD): No  The 10-year ASCVD risk score (Arnett DK, et al., 2019) is: 37.3%   Values used to calculate the score:     Age: 30 years     Clincally relevant sex: Male     Is Non-Hispanic African American: Yes     Diabetic: Yes     Tobacco smoker: Yes     Systolic Blood Pressure: 160 mmHg     Is BP  treated: Yes     HDL Cholesterol: 79 mg/dL     Total Cholesterol: 202 mg/dL   Patient is participating in a Managed Medicaid Plan:  No  A/P: Diabetes longstanding, currently uncontrolled with last A1C of 8.5% in June. This has improved from 10.1 prior. Continues to be motivated to control DM via lifestyle. He endorses good adherence to glipizide  and pioglitazone . He is not overly symptomatic from a hyperglycemia standpoint at this time. He is having several episodes of hypoglycemia but I am unable to verify if this is truly objective hypoglycemia.  Patient is able to verbalize hypoglycemia management plan. We will decrease glipizide  to the XL 5 mg daily dose.  -Continued pioglitazone  15 mg daily. -Decrease glipizide  to 5 mg XL daily.  -Patient educated on purpose, proper use, and potential adverse effects of glipizide , pioglitazone .  -Extensively discussed pathophysiology of diabetes, recommended lifestyle interventions, dietary effects on blood sugar control. Patient has goal to cut out soda intake completely and focus on eating non-starchy vegetables and non-fried protein with meals. -Counseled on s/sx of and management of hypoglycemia.  -Next A1c anticipated 06/2024.   Written patient instructions provided. Patient verbalized understanding of treatment plan.  Total time in face to face counseling 30 minutes.    Follow-up:  PCP clinic visit in 06/06/2024  Herlene Fleeta Morris, PharmD, BCACP, CPP Clinical Pharmacist [facility] [phone]

## 2024-05-21 ENCOUNTER — Ambulatory Visit (AMBULATORY_SURGERY_CENTER)

## 2024-05-21 ENCOUNTER — Telehealth: Payer: Self-pay

## 2024-05-21 VITALS — Ht 72.0 in | Wt 215.0 lb

## 2024-05-21 DIAGNOSIS — Z860100 Personal history of colon polyps, unspecified: Secondary | ICD-10-CM

## 2024-05-21 MED ORDER — NA SULFATE-K SULFATE-MG SULF 17.5-3.13-1.6 GM/177ML PO SOLN: 1.0000 | Freq: Once | ORAL | 0 refills | Status: AC

## 2024-05-21 NOTE — Progress Notes (Signed)

## 2024-05-21 NOTE — Telephone Encounter (Signed)
 RN left message that the patient needed to call back and reschedule the pre visit by 4:30pm today or the colonoscopy would be cancelled.

## 2024-05-21 NOTE — Telephone Encounter (Signed)
 Message left for patient that RN was trying to reach him for Pre visit appt.  Will call back in 5 min.

## 2024-05-21 NOTE — Telephone Encounter (Signed)
 Completed pre visit

## 2024-05-29 NOTE — Progress Notes (Unsigned)
 [facility] History and Physical   Primary Care Physician:  Delbert Clam, MD   Reason for Procedure:    Encounter Diagnosis  Name Primary?   History of colon polyps Yes     Plan:    Colonoscopy     HPI: Edward LINDBLOOM Sr. is a 53 y.o. male with a history of adenomatous and sessile serrated colon polyps (4 total polyps removed 2021) here for surveillance colonoscopy.  He does have a history of unexplained diarrhea, prior colonoscopy upper endoscopy and small bowel capsule endoscopy did not reveal a cause.  I did think it was likely autonomic neuropathy causing diarrhea.  He is status post renal transplant.   Past Medical History:  Diagnosis Date   Acute on chronic renal failure (HCC) 11/25/2015   Anemia    Arthritis    Blind    Left eye   Cataract    CHF (congestive heart failure) (HCC)    Chronic kidney disease    stage 5   COVID-19    Depression    situatuional   Diabetes mellitus    Type II   GERD (gastroesophageal reflux disease)    Graves disease 2014   Headache    in past   Hx of adenomatous and sessile serrated colonic polyps 11/21/2019   Hyperlipidemia    Hypertension    Hypothyroidism    Legally blind    right eye   Neuropathy    Non-compliance    Proteinuria 05/24/2020   Shortness of breath dyspnea    Better since I've been taking my medications    Past Surgical History:  Procedure Laterality Date   AV FISTULA PLACEMENT Right 04/22/2019   Procedure: RIGHT ARM ARTERIOVENOUS (AV) FISTULA CREATION;  Surgeon: Eliza Lonni RAMAN, MD;  Location: [facility] OR;  Service: Vascular;  Laterality: Right;   CIRCUMCISION     as a child, around 46 years of age   COLONOSCOPY     EYE SURGERY Bilateral    several    KIDNEY TRANSPLANT  01/01/2022   PARS PLANA VITRECTOMY Right 03/25/2016   Procedure: PARS PLANA VITRECTOMY 25 GAUGE FOR ENDOPHTHALMITIS;  Surgeon: Onesimo Blanch, MD;  Location: [facility] OR;  Service: Ophthalmology;  Laterality: Right;    WISDOM TOOTH EXTRACTION       Current Outpatient Medications  Medication Sig Dispense Refill   atorvastatin  (LIPITOR) 40 MG tablet Take 1 tablet (40 mg total) by mouth daily. 90 tablet 1   carvedilol  (COREG ) 12.5 MG tablet Take 1 tablet (12.5 mg total) by mouth 2 (two) times daily with a meal. 180 tablet 1   glipiZIDE  (GLUCOTROL  XL) 5 MG 24 hr tablet Take 1 tablet (5 mg total) by mouth daily with breakfast. 90 tablet 1   glucose blood (ACCU-CHEK GUIDE) test strip Use to check blood sugar once daily. E11.39 100 each 2   levothyroxine  (SYNTHROID ) 175 MCG tablet Take 1 tablet (175 mcg total) by mouth daily before breakfast. 90 tablet 1   mycophenolate (MYFORTIC) 180 MG EC tablet Take 180 mg by mouth 2 (two) times daily.     pantoprazole  (PROTONIX ) 40 MG tablet Take 1 tablet (40 mg total) by mouth daily. 30 tablet 2   pioglitazone  (ACTOS ) 30 MG tablet Take 1 tablet (30 mg total) by mouth daily. 90 tablet 1   predniSONE  (DELTASONE ) 5 MG tablet Take 5 mg by mouth daily.     tacrolimus  (PROGRAF ) 1 MG capsule Take 1 mg by mouth 2 (two) times daily.  valsartan  (DIOVAN ) 40 MG tablet Take 1 tablet (40 mg total) by mouth daily. 90 tablet 1   Vitamin D, Ergocalciferol, (DRISDOL) 1.25 MG (50000 UNIT) CAPS capsule Take 50,000 Units by mouth once a week.     Accu-Chek Softclix Lancets lancets Use to check blood sugar once daily. E11.39 100 each 2   acetaminophen  (TYLENOL ) 500 MG tablet Take 1 tablet (500 mg total) by mouth every 6 (six) hours as needed for moderate pain (pain score 4-6) or mild pain (pain score 1-3). 100 tablet 1   Alcohol Swabs (B-D SINGLE USE SWABS REGULAR) PADS Use as directed. 100 each 6   ammonium lactate  (AMLACTIN DAILY) 12 % lotion Apply 1 Application topically as needed for dry skin. 400 g 1   Blood Glucose Calibration (MYGLUCOHEALTH CONTROL) SOLN      Blood Glucose Monitoring Suppl (ACCU-CHEK GUIDE) w/Device KIT Use to check blood sugar once daily. E11.39 1 kit 0   diclofenac   Sodium (VOLTAREN ) 1 % GEL Apply 4 g topically 4 (four) times daily. 100 g 1   dicyclomine  (BENTYL ) 20 MG tablet Take 1 tablet (20 mg total) by mouth 2 (two) times daily as needed for spasms. 60 tablet 2   diphenoxylate -atropine  (LOMOTIL ) 2.5-0.025 MG tablet Take 1 tablet by mouth 4 (four) times daily as needed for diarrhea or loose stools. 30 tablet 0   famotidine  (PEPCID ) 20 MG tablet Take 20 mg by mouth.     furosemide  (LASIX ) 80 MG tablet Take 1 tablet (80 mg total) by mouth 2 (two) times daily. On the days you DO NOT have dialysis (Patient not taking: No sig reported) 34 tablet 2   ondansetron  (ZOFRAN -ODT) 4 MG disintegrating tablet Take 4 mg by mouth.     polyethylene glycol powder (GLYCOLAX /MIRALAX ) 17 GM/SCOOP powder Take as directed as needed for constipation 850 g 1   sildenafil  (VIAGRA ) 100 MG tablet Take 1 tablet (100 mg total) by mouth daily as needed for erectile dysfunction. 10 tablet 1   Current Facility-Administered Medications  Medication Dose Route Frequency Provider Last Rate Last Admin   0.9 %  sodium chloride  infusion  500 mL Intravenous Continuous Avram Lupita BRAVO, MD        Allergies as of 05/30/2024   (No Known Allergies)    Family History  Problem Relation Age of Onset   Hypertension Mother    Diabetes Mother    Bladder Cancer Brother    Kidney disease Other    Colon cancer Neg Hx    Rectal cancer Neg Hx    Esophageal cancer Neg Hx    Stomach cancer Neg Hx    Colon polyps Neg Hx     Social History   Socioeconomic History   Marital status: Divorced    Spouse name: Not on file   Number of children: 11   Years of education: Not on file   Highest education level: Not on file  Occupational History   Occupation: disabled  Tobacco Use   Smoking status: Former    Current packs/day: 0.00    Average packs/day: 0.8 packs/day for 3.0 years (2.3 ttl pk-yrs)    Types: Cigarettes    Start date: 05/28/1997    Quit date: 05/28/2000    Years since quitting: 24.0    Smokeless tobacco: Never  Vaping Use   Vaping status: Never Used  Substance and Sexual Activity   Alcohol use: Not Currently    Alcohol/week: 0.0 standard drinks of alcohol    Comment: 1 beer  ocassional   Drug use: Yes    Types: Marijuana    Comment: daily for pain   Sexual activity: Yes  Other Topics Concern   Not on file  Social History Narrative   Divorced   51 children   15 yo son with him at home   Disabled chef - cafeterias, Cytogeneticist, [employer], etc      Former cigarette smoker, marijuana now, rare alcohol, other drugs   Social Drivers of Corporate investment banker Strain: High Risk (12/13/2023)   Overall Financial Resource Strain (CARDIA)    Difficulty of Paying Living Expenses: Hard  Food Insecurity: Low Risk  (01/15/2024)   Received from [facility]   Hunger Vital Sign    Within the past 12 months, you worried that your food would run out before you got money to buy more: Never true    Within the past 12 months, the food you bought just didn't last and you didn't have money to get more. : Never true  Recent Concern: Food Insecurity - Food Insecurity Present (12/25/2023)   Hunger Vital Sign    Worried About Running Out of Food in the Last Year: Sometimes true    Ran Out of Food in the Last Year: Sometimes true  Transportation Needs: No Transportation Needs (01/15/2024)   Received from [facility]    In the past 12 months, has lack of reliable transportation kept you from medical appointments, meetings, work or from getting things needed for daily living? : No  Recent Concern: Transportation Needs - Unmet Transportation Needs (12/05/2023)   PRAPARE - Administrator, Civil Service (Medical): Yes    Lack of Transportation (Non-Medical): Yes  Physical Activity: Insufficiently Active (12/05/2023)   Exercise Vital Sign    Days of Exercise per Week: 3 days    Minutes of Exercise per Session: 30 min  Stress: Stress Concern Present (12/05/2023)    [employer] of Occupational Health - Occupational Stress Questionnaire    Feeling of Stress : Rather much  Social Connections: Socially Isolated (12/05/2023)   Social Connection and Isolation Panel    Frequency of Communication with Friends and Family: More than three times a week    Frequency of Social Gatherings with Friends and Family: Once a week    Attends Religious Services: Never    Database administrator or Organizations: No    Attends Banker Meetings: Never    Marital Status: Divorced  Catering manager Violence: Not At Risk (12/25/2023)   Humiliation, Afraid, Rape, and Kick questionnaire    Fear of Current or Ex-Partner: No    Emotionally Abused: No    Physically Abused: No    Sexually Abused: No    Review of Systems:  All other review of systems negative except as mentioned in the HPI.  Physical Exam: Vital signs BP (!) 143/74   Pulse (!) 56   Temp (!) 97.2 F (36.2 C)   Ht 6' (1.829 m)   Wt 215 lb (97.5 kg)   SpO2 100%   BMI 29.16 kg/m   General:   Alert,  Well-developed, well-nourished, pleasant and cooperative in NAD Lungs:  Clear throughout to auscultation.   Heart:  Regular rate and rhythm; no murmurs, clicks, rubs,  or gallops. Abdomen:  Soft, nontender and nondistended. Normal bowel sounds.   Neuro/Psych:  Alert and cooperative. Normal mood and affect. A and O x 3   @Joee Iovine  CHARLENA Commander, MD, NOLIA  [facility] [phone] (pager) 05/30/2024 11:27 AM@

## 2024-05-30 ENCOUNTER — Ambulatory Visit (AMBULATORY_SURGERY_CENTER): Admitting: Internal Medicine

## 2024-05-30 ENCOUNTER — Encounter: Admitting: Internal Medicine

## 2024-05-30 ENCOUNTER — Encounter: Payer: Self-pay | Admitting: Internal Medicine

## 2024-05-30 VITALS — BP 126/74 | HR 68 | Temp 97.2°F | Resp 17 | Ht 72.0 in | Wt 215.0 lb

## 2024-05-30 DIAGNOSIS — D122 Benign neoplasm of ascending colon: Secondary | ICD-10-CM | POA: Diagnosis not present

## 2024-05-30 DIAGNOSIS — Z1211 Encounter for screening for malignant neoplasm of colon: Secondary | ICD-10-CM | POA: Diagnosis not present

## 2024-05-30 DIAGNOSIS — I509 Heart failure, unspecified: Secondary | ICD-10-CM | POA: Diagnosis not present

## 2024-05-30 DIAGNOSIS — N185 Chronic kidney disease, stage 5: Secondary | ICD-10-CM | POA: Diagnosis not present

## 2024-05-30 DIAGNOSIS — E119 Type 2 diabetes mellitus without complications: Secondary | ICD-10-CM | POA: Diagnosis not present

## 2024-05-30 DIAGNOSIS — D125 Benign neoplasm of sigmoid colon: Secondary | ICD-10-CM

## 2024-05-30 DIAGNOSIS — Z860101 Personal history of adenomatous and serrated colon polyps: Secondary | ICD-10-CM | POA: Diagnosis not present

## 2024-05-30 DIAGNOSIS — Z860100 Personal history of colon polyps, unspecified: Secondary | ICD-10-CM

## 2024-05-30 DIAGNOSIS — E039 Hypothyroidism, unspecified: Secondary | ICD-10-CM | POA: Diagnosis not present

## 2024-05-30 DIAGNOSIS — I10 Essential (primary) hypertension: Secondary | ICD-10-CM | POA: Diagnosis not present

## 2024-05-30 MED ORDER — SODIUM CHLORIDE 0.9 % IV SOLN: 500.0000 mL | INTRAVENOUS | Status: DC

## 2024-05-30 NOTE — Progress Notes (Signed)
 Sedate, gd SR, tolerated procedure well, VSS, report to RN

## 2024-05-30 NOTE — Patient Instructions (Addendum)
 I found and removed 2 small polyps.  All else ok.  I will let you know pathology results and when to have another routine colonoscopy by mail and/or My Chart.  I appreciate the opportunity to care for you. Lupita CHARLENA Commander, MD, [facility]   Resume all of your previous medications today as ordered. The biopsies will be back in 2 weeks. Read your discharge instructions.  YOU HAD AN ENDOSCOPIC PROCEDURE TODAY AT THE [facility]:   Refer to the procedure report that was given to you for any specific questions about what was found during the examination.  If the procedure report does not answer your questions, please call your gastroenterologist to clarify.  If you requested that your care partner not be given the details of your procedure findings, then the procedure report has been included in a sealed envelope for you to review at your convenience later.  YOU SHOULD EXPECT: Some feelings of bloating in the abdomen. Passage of more gas than usual.  Walking can help get rid of the air that was put into your GI tract during the procedure and reduce the bloating. If you had a lower endoscopy (such as a colonoscopy or flexible sigmoidoscopy) you may notice spotting of blood in your stool or on the toilet paper. If you underwent a bowel prep for your procedure, you may not have a normal bowel movement for a few days.  Please Note:  You might notice some irritation and congestion in your nose or some drainage.  This is from the oxygen  used during your procedure.  There is no need for concern and it should clear up in a day or so.  SYMPTOMS TO REPORT IMMEDIATELY:  Following lower endoscopy (colonoscopy or flexible sigmoidoscopy):  Excessive amounts of blood in the stool  Significant tenderness or worsening of abdominal pains  Swelling of the abdomen that is new, acute  Fever of 100F or higher  For urgent or emergent issues, a gastroenterologist can be reached at any hour by calling [phone]  [phone]. Do not use MyChart messaging for urgent concerns.    DIET:  We do recommend a small meal at first, but then you may proceed to your regular diet.  Drink plenty of fluids but you should avoid alcoholic beverages for 24 hours.  ACTIVITY:  You should plan to take it easy for the rest of today and you should NOT DRIVE or use heavy machinery until tomorrow (because of the sedation medicines used during the test).    FOLLOW UP: Our staff will call the number listed on your records the next business day following your procedure.  We will call around 7:15- 8:00 am to check on you and address any questions or concerns that you may have regarding the information given to you following your procedure. If we do not reach you, we will leave a message.     If any biopsies were taken you will be contacted by phone or by letter within the next 1-3 weeks.  Please call us  at [phone] if you have not heard about the biopsies in 3 weeks.    SIGNATURES/CONFIDENTIALITY: You and/or your care partner have signed paperwork which will be entered into your electronic medical record.  These signatures attest to the fact that that the information above on your After Visit Summary has been reviewed and is understood.  Full responsibility of the confidentiality of this discharge information lies with you and/or your care-partner.

## 2024-05-30 NOTE — Progress Notes (Signed)
 Called to room to assist during endoscopic procedure.  Patient ID and intended procedure confirmed with present staff. Received instructions for my participation in the procedure from the performing physician.

## 2024-05-30 NOTE — Op Note (Signed)
 [facility] Patient Name: Edward [name] Procedure Date: 05/30/2024 11:21 AM MRN: [id number] Endoscopist: Lupita FORBES Commander , MD, [id number] Age: 53 Referring MD:  Date of Birth: 1970/10/10 Gender: Male Account #: [id number] Procedure:                Colonoscopy Indications:              High risk colon cancer surveillance: Personal                            history of colonic polyps, Last colonoscopy: 2020 Medicines:                Monitored Anesthesia Care Procedure:                Pre-Anesthesia Assessment:                           - Prior to the procedure, a History and Physical                            was performed, and patient medications and                            allergies were reviewed. The patient's tolerance of                            previous anesthesia was also reviewed. The risks                            and benefits of the procedure and the sedation                            options and risks were discussed with the patient.                            All questions were answered, and informed consent                            was obtained. Prior Anticoagulants: The patient has                            taken no anticoagulant or antiplatelet agents. ASA                            Grade Assessment: III - A patient with severe                            systemic disease. After reviewing the risks and                            benefits, the patient was deemed in satisfactory                            condition to undergo the procedure.  After obtaining informed consent, the colonoscope                            was passed under direct vision. Throughout the                            procedure, the patient's blood pressure, pulse, and                            oxygen  saturations were monitored continuously. The                            Olympus Scope SN: [id number] was introduced through                            the  anus and advanced to the the cecum, identified                            by appendiceal orifice and ileocecal valve. The                            colonoscopy was performed without difficulty. The                            patient tolerated the procedure well. The quality                            of the bowel preparation was adequate. The                            ileocecal valve, appendiceal orifice, and rectum                            were photographed. The bowel preparation used was                            SUPREP via split dose instruction. Scope In: 11:34:11 AM Scope Out: 11:50:42 AM Scope Withdrawal Time: 0 hours 12 minutes 27 seconds  Total Procedure Duration: 0 hours 16 minutes 31 seconds  Findings:                 The perianal and digital rectal examinations were                            normal.                           An 8 mm polyp was found in the ascending colon. The                            polyp was sessile. The polyp was removed with a                            cold snare. Resection and retrieval were complete.  Verification of patient identification for the                            specimen was done. Estimated blood loss was minimal.                           A 3 mm polyp was found in the sigmoid colon. The                            polyp was sessile. The polyp was removed with a                            cold snare. Resection and retrieval were complete.                            Verification of patient identification for the                            specimen was done. Estimated blood loss was minimal.                           The exam was otherwise without abnormality on                            direct and retroflexion views. Complications:            No immediate complications. Estimated Blood Loss:     Estimated blood loss was minimal. Impression:               - One 8 mm polyp in the ascending colon, removed                             with a cold snare. Resected and retrieved.                           - One 3 mm polyp in the sigmoid colon, removed with                            a cold snare. Resected and retrieved.                           - The examination was otherwise normal on direct                            and retroflexion views.                           - Personal history of colonic polyps. 2021- 4                            diminutive polyps 3 adenomas and 1 ssp Recommendation:           - Patient has a contact number available for  emergencies. The signs and symptoms of potential                            delayed complications were discussed with the                            patient. Return to normal activities tomorrow.                            Written discharge instructions were provided to the                            patient.                           - Resume previous diet.                           - Continue present medications.                           - Repeat colonoscopy is recommended for                            surveillance. The colonoscopy date will be                            determined after pathology results from today's                            exam become available for review. Lupita FORBES Commander, MD 05/30/2024 11:59:27 AM This report has been signed electronically.

## 2024-05-30 NOTE — Progress Notes (Signed)
 Pt's states no medical or surgical changes since previsit or office visit.

## 2024-05-31 ENCOUNTER — Telehealth: Payer: Self-pay

## 2024-05-31 NOTE — Telephone Encounter (Signed)
 Post procedure follow up call, no answer

## 2024-06-04 DIAGNOSIS — E890 Postprocedural hypothyroidism: Secondary | ICD-10-CM | POA: Diagnosis not present

## 2024-06-04 DIAGNOSIS — E1121 Type 2 diabetes mellitus with diabetic nephropathy: Secondary | ICD-10-CM | POA: Diagnosis not present

## 2024-06-04 LAB — SURGICAL PATHOLOGY

## 2024-06-05 ENCOUNTER — Ambulatory Visit: Payer: Self-pay | Admitting: Internal Medicine

## 2024-06-05 ENCOUNTER — Telehealth: Payer: Self-pay | Admitting: Family Medicine

## 2024-06-05 DIAGNOSIS — Z860100 Personal history of colon polyps, unspecified: Secondary | ICD-10-CM

## 2024-06-05 NOTE — Telephone Encounter (Signed)
 Pt confirmed appt 9/2 (per volunteer)

## 2024-06-06 ENCOUNTER — Encounter: Payer: Self-pay | Admitting: Family Medicine

## 2024-06-06 ENCOUNTER — Ambulatory Visit: Attending: Family Medicine | Admitting: Family Medicine

## 2024-06-06 VITALS — BP 137/73 | HR 66 | Ht 72.0 in | Wt 214.0 lb

## 2024-06-06 DIAGNOSIS — M2550 Pain in unspecified joint: Secondary | ICD-10-CM

## 2024-06-06 DIAGNOSIS — E0500 Thyrotoxicosis with diffuse goiter without thyrotoxic crisis or storm: Secondary | ICD-10-CM

## 2024-06-06 DIAGNOSIS — Z940 Kidney transplant status: Secondary | ICD-10-CM | POA: Diagnosis not present

## 2024-06-06 DIAGNOSIS — K3184 Gastroparesis: Secondary | ICD-10-CM

## 2024-06-06 DIAGNOSIS — M5126 Other intervertebral disc displacement, lumbar region: Secondary | ICD-10-CM

## 2024-06-06 DIAGNOSIS — N189 Chronic kidney disease, unspecified: Secondary | ICD-10-CM | POA: Diagnosis not present

## 2024-06-06 DIAGNOSIS — E1139 Type 2 diabetes mellitus with other diabetic ophthalmic complication: Secondary | ICD-10-CM | POA: Diagnosis not present

## 2024-06-06 DIAGNOSIS — R197 Diarrhea, unspecified: Secondary | ICD-10-CM

## 2024-06-06 DIAGNOSIS — Z7984 Long term (current) use of oral hypoglycemic drugs: Secondary | ICD-10-CM | POA: Diagnosis not present

## 2024-06-06 DIAGNOSIS — N529 Male erectile dysfunction, unspecified: Secondary | ICD-10-CM

## 2024-06-06 DIAGNOSIS — E1169 Type 2 diabetes mellitus with other specified complication: Secondary | ICD-10-CM

## 2024-06-06 DIAGNOSIS — E1122 Type 2 diabetes mellitus with diabetic chronic kidney disease: Secondary | ICD-10-CM | POA: Diagnosis not present

## 2024-06-06 DIAGNOSIS — K529 Noninfective gastroenteritis and colitis, unspecified: Secondary | ICD-10-CM | POA: Diagnosis not present

## 2024-06-06 DIAGNOSIS — E1143 Type 2 diabetes mellitus with diabetic autonomic (poly)neuropathy: Secondary | ICD-10-CM

## 2024-06-06 MED ORDER — METOCLOPRAMIDE HCL 5 MG PO TABS: 5.0000 mg | ORAL_TABLET | Freq: Three times a day (TID) | ORAL | 3 refills | Status: AC | PRN

## 2024-06-06 MED ORDER — SILDENAFIL CITRATE 100 MG PO TABS
100.0000 mg | ORAL_TABLET | Freq: Every day | ORAL | 1 refills | Status: AC | PRN
Start: 2024-06-06 — End: ?

## 2024-06-06 MED ORDER — ACETAMINOPHEN 500 MG PO TABS: 500.0000 mg | ORAL_TABLET | Freq: Four times a day (QID) | ORAL | 1 refills | Status: AC | PRN

## 2024-06-06 MED ORDER — DICLOFENAC SODIUM 1 % EX GEL: 4.0000 g | Freq: Four times a day (QID) | CUTANEOUS | 1 refills | Status: AC

## 2024-06-06 NOTE — Patient Instructions (Signed)
 VISIT SUMMARY:  During your visit, we discussed your ongoing issues with gastroparesis, chronic body aches, diabetes, hypothyroidism, chronic diarrhea, and erectile dysfunction. We reviewed your recent gastric emptying study and made plans to address your symptoms and improve your overall health.  YOUR PLAN:  -GASTROPARESIS: Gastroparesis is a condition where your stomach empties food into the small intestine more slowly than normal. We will start you on Reglan  5 mg three times daily as needed to help with this. Additionally, try to eat small, frequent meals to manage your symptoms better.  -CHRONIC GENERALIZED MUSCULOSKELETAL PAIN: This refers to ongoing pain in multiple areas of your body, which affects your daily activities and sleep. We will refer you to physical medicine and rehabilitation for a comprehensive pain management plan.  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 Continue following up with your endocrinologist to maintain this progress.  -POSTABLATIVE HYPOTHYROIDISM: This is a condition where your thyroid  levels are low following treatment for Graves' disease. We will order thyroid  function tests to check your current levels and adjust your medication if needed.  -CHRONIC DIARRHEA: You have been experiencing intermittent diarrhea. Since your last colonoscopy revealed polyps that were removed, we recommend a follow-up colonoscopy in five years to monitor your condition.  -MALE ERECTILE DYSFUNCTION: This condition affects your ability to maintain an erection. We will resend your prescription for Viagra  100 mg to the pharmacy. Please check with the pharmacy about insurance coverage and potential assistance programs.  INSTRUCTIONS:  Please follow up with the recommended specialists and complete the lab work for your thyroid  function tests.  Continue to monitor your blood sugar levels and maintain your current diabetes management plan. Schedule your follow-up colonoscopy in five years.

## 2024-06-06 NOTE — Progress Notes (Signed)
 Subjective:  Patient ID: Edward LITTIE Sheffield Sr., male    DOB: 28-Jan-1971  Age: 53 y.o. MRN: [id number]  CC: Medical Management of Chronic Issues (Having body pain)     Discussed the use of AI scribe software for clinical note transcription with the patient, who gave verbal consent to proceed.  History of Present Illness Ahman Dugdale. is a 53 year old male with a history of hypertension, type 2 diabetes mellitus, Graves' disease (status post radioactive iodine treatment in 07/2017), ESRD (status post renal transplant at [facility] on 01/06/2022), erectile dysfunction, previous bilateral lower extremity DVT (completed anticoagulation)  gastroparesis who presents with chronic body aches and gastrointestinal symptoms.  His gastric emptying study confirms gastroparesis, leading to significant eating difficulties and lack of appetite. He often goes days without food. No medication has been started for gastroparesis.  He wanted to wait to see me prior to discussing medication.  He experienced diarrhea on the morning of the visit. Previous colonoscopy revealed two polyps, which were removed. Follow-up colonoscopies are advised every five years. Chronic body aches affect his stomach, back, knees, and shoulders, interfering with daily activities. Previous pain management was ineffective, and he is not currently involved in pain management or physical therapy. Sleep quality is poor due to discomfort.  He has a history of Graves' disease, treated with radioactive therapy, and is on levothyroxine . Thyroid  levels were previously uncontrolled, and he is due for lab work.  He underwent a kidney transplant in 2023 and is followed by a nephrologist. He takes vitamin D supplements for deficiency.  He experienced diarrhea on the morning of the visit. Previous colonoscopy revealed two polyps, which were removed. Follow-up colonoscopies are advised every five years.  Diabetes management has improved  his A1c from 8.5% to 6% with endocrinologist care.  Last endocrine visit was 2 days ago.    Past Medical History:  Diagnosis Date   Acute on chronic renal failure (HCC) 11/25/2015   Anemia    Arthritis    Blind    Left eye   Cataract    CHF (congestive heart failure) (HCC)    Chronic kidney disease    stage 5   COVID-19    Depression    situatuional   Diabetes mellitus    Type II   GERD (gastroesophageal reflux disease)    Graves disease 2014   Headache    in past   Hx of adenomatous and sessile serrated colonic polyps 11/21/2019   Hyperlipidemia    Hypertension    Hypothyroidism    Legally blind    right eye   Neuropathy    Non-compliance    Proteinuria 05/24/2020   Shortness of breath dyspnea    Better since I've been taking my medications    Past Surgical History:  Procedure Laterality Date   AV FISTULA PLACEMENT Right 04/22/2019   Procedure: RIGHT ARM ARTERIOVENOUS (AV) FISTULA CREATION;  Surgeon: Eliza Lonni RAMAN, MD;  Location: [facility] OR;  Service: Vascular;  Laterality: Right;   CIRCUMCISION     as a child, around 24 years of age   COLONOSCOPY     EYE SURGERY Bilateral    several    KIDNEY TRANSPLANT  01/01/2022   PARS PLANA VITRECTOMY Right 03/25/2016   Procedure: PARS PLANA VITRECTOMY 25 GAUGE FOR ENDOPHTHALMITIS;  Surgeon: Onesimo Blanch, MD;  Location: [facility] OR;  Service: Ophthalmology;  Laterality: Right;   WISDOM TOOTH EXTRACTION      Family History  Problem Relation  Age of Onset   Hypertension Mother    Diabetes Mother    Bladder Cancer Brother    Kidney disease Other    Colon cancer Neg Hx    Rectal cancer Neg Hx    Esophageal cancer Neg Hx    Stomach cancer Neg Hx    Colon polyps Neg Hx     Social History   Socioeconomic History   Marital status: Divorced    Spouse name: Not on file   Number of children: 11   Years of education: Not on file   Highest education level: Not on file  Occupational History   Occupation: disabled   Tobacco Use   Smoking status: Former    Current packs/day: 0.00    Average packs/day: 0.8 packs/day for 3.0 years (2.3 ttl pk-yrs)    Types: Cigarettes    Start date: 05/28/1997    Quit date: 05/28/2000    Years since quitting: 24.0   Smokeless tobacco: Never  Vaping Use   Vaping status: Never Used  Substance and Sexual Activity   Alcohol use: Not Currently    Alcohol/week: 0.0 standard drinks of alcohol    Comment: 1 beer  ocassional   Drug use: Yes    Types: Marijuana    Comment: daily for pain   Sexual activity: Yes  Other Topics Concern   Not on file  Social History Narrative   Divorced   11 children   38 yo son with him at home   Disabled chef - cafeterias, Cytogeneticist, [employer], etc      Former cigarette smoker, marijuana now, rare alcohol, other drugs   Social Drivers of Corporate investment banker Strain: High Risk (12/13/2023)   Overall Financial Resource Strain (CARDIA)    Difficulty of Paying Living Expenses: Hard  Food Insecurity: Low Risk  (06/04/2024)   Received from [facility]   Hunger Vital Sign    Within the past 12 months, you worried that your food would run out before you got money to buy more: Never true    Within the past 12 months, the food you bought just didn't last and you didn't have money to get more. : Never true  Transportation Needs: No Transportation Needs (06/04/2024)   Received from [facility]    In the past 12 months, has lack of reliable transportation kept you from medical appointments, meetings, work or from getting things needed for daily living? : No  Physical Activity: Insufficiently Active (12/05/2023)   Exercise Vital Sign    Days of Exercise per Week: 3 days    Minutes of Exercise per Session: 30 min  Stress: Stress Concern Present (12/05/2023)   [employer] of Occupational Health - Occupational Stress Questionnaire    Feeling of Stress : Rather much  Social Connections: Socially Isolated (12/05/2023)    Social Connection and Isolation Panel    Frequency of Communication with Friends and Family: More than three times a week    Frequency of Social Gatherings with Friends and Family: Once a week    Attends Religious Services: Never    Database administrator or Organizations: No    Attends Banker Meetings: Never    Marital Status: Divorced    No Known Allergies  Outpatient Medications Prior to Visit  Medication Sig Dispense Refill   Accu-Chek Softclix Lancets lancets Use to check blood sugar once daily. E11.39 100 each 2   Alcohol Swabs (B-D SINGLE USE SWABS  REGULAR) PADS Use as directed. 100 each 6   ammonium lactate  (AMLACTIN DAILY) 12 % lotion Apply 1 Application topically as needed for dry skin. 400 g 1   atorvastatin  (LIPITOR) 40 MG tablet Take 1 tablet (40 mg total) by mouth daily. 90 tablet 1   Blood Glucose Calibration (MYGLUCOHEALTH CONTROL) SOLN      Blood Glucose Monitoring Suppl (ACCU-CHEK GUIDE) w/Device KIT Use to check blood sugar once daily. E11.39 1 kit 0   carvedilol  (COREG ) 12.5 MG tablet Take 1 tablet (12.5 mg total) by mouth 2 (two) times daily with a meal. 180 tablet 1   dicyclomine  (BENTYL ) 20 MG tablet Take 1 tablet (20 mg total) by mouth 2 (two) times daily as needed for spasms. 60 tablet 2   diphenoxylate -atropine  (LOMOTIL ) 2.5-0.025 MG tablet Take 1 tablet by mouth 4 (four) times daily as needed for diarrhea or loose stools. 30 tablet 0   famotidine  (PEPCID ) 20 MG tablet Take 20 mg by mouth.     furosemide  (LASIX ) 80 MG tablet Take 1 tablet (80 mg total) by mouth 2 (two) times daily. On the days you DO NOT have dialysis 34 tablet 2   glipiZIDE  (GLUCOTROL  XL) 5 MG 24 hr tablet Take 1 tablet (5 mg total) by mouth daily with breakfast. 90 tablet 1   glucose blood (ACCU-CHEK GUIDE) test strip Use to check blood sugar once daily. E11.39 100 each 2   levothyroxine  (SYNTHROID ) 175 MCG tablet Take 1 tablet (175 mcg total) by mouth daily before breakfast. 90  tablet 1   mycophenolate (MYFORTIC) 180 MG EC tablet Take 180 mg by mouth 2 (two) times daily.     ondansetron  (ZOFRAN -ODT) 4 MG disintegrating tablet Take 4 mg by mouth.     pantoprazole  (PROTONIX ) 40 MG tablet Take 1 tablet (40 mg total) by mouth daily. 30 tablet 2   pioglitazone  (ACTOS ) 30 MG tablet Take 1 tablet (30 mg total) by mouth daily. 90 tablet 1   polyethylene glycol powder (GLYCOLAX /MIRALAX ) 17 GM/SCOOP powder Take as directed as needed for constipation 850 g 1   predniSONE  (DELTASONE ) 5 MG tablet Take 5 mg by mouth daily.     tacrolimus  (PROGRAF ) 1 MG capsule Take 1 mg by mouth 2 (two) times daily.     valsartan  (DIOVAN ) 40 MG tablet Take 1 tablet (40 mg total) by mouth daily. 90 tablet 1   Vitamin D, Ergocalciferol, (DRISDOL) 1.25 MG (50000 UNIT) CAPS capsule Take 50,000 Units by mouth once a week.     acetaminophen  (TYLENOL ) 500 MG tablet Take 1 tablet (500 mg total) by mouth every 6 (six) hours as needed for moderate pain (pain score 4-6) or mild pain (pain score 1-3). 100 tablet 1   diclofenac  Sodium (VOLTAREN ) 1 % GEL Apply 4 g topically 4 (four) times daily. 100 g 1   sildenafil  (VIAGRA ) 100 MG tablet Take 1 tablet (100 mg total) by mouth daily as needed for erectile dysfunction. 10 tablet 1   No facility-administered medications prior to visit.     ROS Review of Systems  Constitutional:  Negative for activity change and appetite change.  HENT:  Negative for sinus pressure and sore throat.   Respiratory:  Negative for chest tightness, shortness of breath and wheezing.   Cardiovascular:  Negative for chest pain and palpitations.  Gastrointestinal:  Positive for diarrhea. Negative for abdominal distention, abdominal pain and constipation.  Genitourinary: Negative.   Musculoskeletal:        See HPI  Psychiatric/Behavioral:  Negative for behavioral problems and dysphoric mood.     Objective:  BP 137/73   Pulse 66   Ht 6' (1.829 m)   Wt 214 lb (97.1 kg)   SpO2 98%    BMI 29.02 kg/m      06/06/2024    9:18 AM 05/30/2024   12:14 PM 05/30/2024   12:12 PM  BP/Weight  Systolic BP 137 126 126  Diastolic BP 73 74 74  Wt. (Lbs) 214    BMI 29.02 kg/m2        Physical Exam Constitutional:      Appearance: He is well-developed.  Cardiovascular:     Rate and Rhythm: Normal rate.     Heart sounds: Normal heart sounds. No murmur heard. Pulmonary:     Effort: Pulmonary effort is normal.     Breath sounds: Normal breath sounds. No wheezing or rales.  Chest:     Chest wall: No tenderness.  Abdominal:     General: Bowel sounds are normal. There is no distension.     Palpations: Abdomen is soft. There is no mass.     Tenderness: There is no abdominal tenderness.  Musculoskeletal:        General: Normal range of motion.     Right lower leg: No edema.     Left lower leg: No edema.  Neurological:     Mental Status: He is alert and oriented to person, place, and time.  Psychiatric:        Mood and Affect: Mood normal.        Latest Ref Rng & Units 11/01/2023   11:38 AM 12/23/2022    9:47 PM 12/19/2022    3:12 PM  CMP  Glucose 70 - 99 mg/dL 521  579  754   BUN 6 - 24 mg/dL 22  40  19   Creatinine 0.76 - 1.27 mg/dL 8.31  7.52  8.14   Sodium 134 - 144 mmol/L 136  129  136   Potassium 3.5 - 5.2 mmol/L 4.9  3.6  4.3   Chloride 96 - 106 mmol/L 105  97  108   CO2 20 - 29 mmol/L 18  21  21    Calcium  8.7 - 10.2 mg/dL 9.4  8.9  9.6   Total Protein 6.0 - 8.5 g/dL 6.7  7.5  8.8   Total Bilirubin 0.0 - 1.2 mg/dL 0.5  1.8  1.3   Alkaline Phos 44 - 121 IU/L 172  78  108   AST 0 - 40 IU/L 24  27  27    ALT 0 - 44 IU/L 51  23  30     Lipid Panel     Component Value Date/Time   CHOL 137 10/21/2019 0854   TRIG 116 10/21/2019 0854   HDL 55 10/21/2019 0854   CHOLHDL 2.5 10/21/2019 0854   CHOLHDL 2.8 09/19/2016 1023   VLDL 37 (H) 09/19/2016 1023   LDLCALC 61 10/21/2019 0854    CBC    Component Value Date/Time   WBC 6.4 11/01/2023 1138   WBC 8.0  12/23/2022 2147   RBC 3.94 (L) 11/01/2023 1138   RBC 4.68 12/23/2022 2147   HGB 12.8 (L) 11/01/2023 1138   HCT 37.4 (L) 11/01/2023 1138   PLT 166 11/01/2023 1138   MCV 95 11/01/2023 1138   MCH 32.5 11/01/2023 1138   MCH 32.3 12/23/2022 2147   MCHC 34.2 11/01/2023 1138   MCHC 36.0 12/23/2022 2147   RDW 13.0 11/01/2023 1138  LYMPHSABS 0.9 11/01/2023 1138   MONOABS 1.0 12/23/2022 2147   EOSABS 0.0 11/01/2023 1138   BASOSABS 0.0 11/01/2023 1138    Lab Results  Component Value Date   HGBA1C 8.5 (A) 03/06/2024    Last A1c - 6.0 ([facility] - 06/04/24)  Lab Results  Component Value Date   HGBA1C 8.5 (A) 03/06/2024   HGBA1C 10.1 (H) 11/01/2023   HGBA1C 6.1 08/29/2023     Lab Results  Component Value Date   TSH 18.800 (H) 03/06/2024       Assessment & Plan Gastroparesis due to diabetes Gastric emptying study confirmed delayed gastric emptying. - Prescribe Reglan  5 mg three times daily as needed. - Advise on dietary modifications: small, frequent meals.  Arthralgia/herniated lumbar disc Chronic pain in multiple areas affecting sleep and daily activities. Previous pain management was ineffective. - Refer to physical medicine and rehabilitation for comprehensive pain management.  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 Continues management with endocrinologist.  Yvone' disease Postablative hypothyroidism Previous thyroid  levels were severely abnormal. - Order thyroid  function tests to assess current levels.  Chronic diarrhea Intermittent diarrhea persists. Previous colonoscopy revealed polyps, which were removed.  Pathology revealed tubular adenoma, negative for malignancy -Keep upcoming appointment with GI - Follow-up colonoscopy recommended in five years.  Male erectile dysfunction Previously prescribed Viagra  100 mg. Reports  insurance issues with obtaining the medication. - Resend prescription for Viagra  100 mg to pharmacy. - Instruct to inquire about insurance coverage and potential assistance programs at pharmacy.   S/p renal transplant Completed 1 year follow-up with transplant team - Continue to follow-up with nephrologist  Healthcare maintenance Up-to-date on colonoscopy  Meds ordered this encounter  Medications   acetaminophen  (TYLENOL ) 500 MG tablet    Sig: Take 1 tablet (500 mg total) by mouth every 6 (six) hours as needed for moderate pain (pain score 4-6) or mild pain (pain score 1-3).    Dispense:  100 tablet    Refill:  1   diclofenac  Sodium (VOLTAREN ) 1 % GEL    Sig: Apply 4 g topically 4 (four) times daily.    Dispense:  100 g    Refill:  1   sildenafil  (VIAGRA ) 100 MG tablet    Sig: Take 1 tablet (100 mg total) by mouth daily as needed for erectile dysfunction.    Dispense:  10 tablet    Refill:  1    Ar least 24 hours between doses   metoCLOPramide  (REGLAN ) 5 MG tablet    Sig: Take 1 tablet (5 mg total) by mouth 3 (three) times daily with meals as needed for nausea.    Dispense:  90 tablet    Refill:  3    Follow-up: Return in about 6 months (around 12/04/2024) for Chronic medical conditions.       Corrina Sabin, MD, FAAFP. [facility] [place], [place] [phone]   06/06/2024, 1:52 PM

## 2024-06-07 ENCOUNTER — Ambulatory Visit: Payer: Self-pay | Admitting: Family Medicine

## 2024-06-07 DIAGNOSIS — E0500 Thyrotoxicosis with diffuse goiter without thyrotoxic crisis or storm: Secondary | ICD-10-CM

## 2024-06-07 LAB — CMP14+EGFR
ALT: 58 IU/L — ABNORMAL HIGH (ref 0–44)
AST: 40 IU/L (ref 0–40)
Albumin: 4.3 g/dL (ref 3.8–4.9)
Alkaline Phosphatase: 122 IU/L — ABNORMAL HIGH (ref 44–121)
BUN/Creatinine Ratio: 17 (ref 9–20)
BUN: 28 mg/dL — ABNORMAL HIGH (ref 6–24)
Bilirubin Total: 0.4 mg/dL (ref 0.0–1.2)
CO2: 17 mmol/L — ABNORMAL LOW (ref 20–29)
Calcium: 9.1 mg/dL (ref 8.7–10.2)
Chloride: 112 mmol/L — ABNORMAL HIGH (ref 96–106)
Creatinine, Ser: 1.67 mg/dL — ABNORMAL HIGH (ref 0.76–1.27)
Globulin, Total: 2.4 g/dL (ref 1.5–4.5)
Glucose: 158 mg/dL — ABNORMAL HIGH (ref 70–99)
Potassium: 4.5 mmol/L (ref 3.5–5.2)
Sodium: 143 mmol/L (ref 134–144)
Total Protein: 6.7 g/dL (ref 6.0–8.5)
eGFR: 49 mL/min/1.73 — ABNORMAL LOW (ref >59)

## 2024-06-07 LAB — TSH: TSH: 28.9 u[IU]/mL — ABNORMAL HIGH (ref 0.450–4.500)

## 2024-06-07 LAB — T4, FREE: Free T4: 0.36 ng/dL — ABNORMAL LOW (ref 0.82–1.77)

## 2024-06-07 LAB — T3: T3, Total: 42 ng/dL — ABNORMAL LOW (ref 71–180)

## 2024-06-07 MED ORDER — LEVOTHYROXINE SODIUM 200 MCG PO TABS: 200.0000 ug | ORAL_TABLET | Freq: Every day | ORAL | 1 refills | Status: AC

## 2024-06-10 ENCOUNTER — Telehealth: Payer: Self-pay

## 2024-06-10 NOTE — Telephone Encounter (Signed)
 Patient was identified as falling into the [place] Measure - Diabetes.   Patient was: Appointment scheduled with primary care provider in the next 30 days.

## 2024-06-14 ENCOUNTER — Ambulatory Visit: Admitting: Nurse Practitioner

## 2024-07-25 ENCOUNTER — Encounter: Payer: Self-pay | Admitting: Podiatry

## 2024-07-25 ENCOUNTER — Ambulatory Visit: Admitting: Podiatry

## 2024-07-25 DIAGNOSIS — B351 Tinea unguium: Secondary | ICD-10-CM

## 2024-07-25 DIAGNOSIS — E1142 Type 2 diabetes mellitus with diabetic polyneuropathy: Secondary | ICD-10-CM

## 2024-07-25 NOTE — Progress Notes (Signed)
 This patient returns to my office for at risk foot care.  This patient requires this care by a professional since this patient will be at risk due to having ESRD, Blind,   and DM with polyneuropathy.  This patient is unable to cut nails himself since the patient cannot reach his nails.These nails are painful walking and wearing shoes.  Patient has kidney transplant.  Patient also has callus developing and occasional hurting  right big toe.This patient presents for at risk foot care today.  General Appearance  Alert, conversant and in no acute stress.  Vascular  Dorsalis pedis and posterior tibial  pulses are  weakly palpable  bilaterally.  Capillary return is within normal limits  bilaterally. Cold feet. bilaterally.  Neurologic  Senn-Weinstein monofilament wire test diminished  bilaterally. Muscle power within normal limits bilaterally.  Nails Thick disfigured discolored nails with subungual debris  from hallux to fifth toes bilaterally. No evidence of bacterial infection or drainage bilaterally.   Orthopedic  No limitations of motion  feet .  No crepitus or effusions noted.  No bony pathology or digital deformities noted.  HAV  B/L.   Skin  normotropic skin with no porokeratosis noted bilaterally.  No signs of infections or ulcers noted.  Callus distal aspect right hallux.   Onychomycosis  Pain in right toes  Pain in left toes  Consent was obtained for treatment procedures.   Mechanical debridement of nails 1-5  bilaterally performed with a nail nipper.  Filed with dremel without incident.    Return office visit  3 months                    Told patient to return for periodic foot care and evaluation due to potential at risk complications.   Cordella Bold DPM

## 2024-08-05 ENCOUNTER — Encounter: Payer: Self-pay | Admitting: Radiology

## 2024-09-17 LAB — LAB REPORT - SCANNED
A1c: 6.5
Albumin, Urine POC: 34.2
Creatinine, POC: 179.5 mg/dL
EGFR: 49
Microalb Creat Ratio: 19

## 2024-10-11 ENCOUNTER — Ambulatory Visit: Admitting: Sports Medicine

## 2024-10-11 ENCOUNTER — Encounter: Payer: Self-pay | Admitting: Sports Medicine

## 2024-10-11 DIAGNOSIS — R5383 Other fatigue: Secondary | ICD-10-CM | POA: Diagnosis not present

## 2024-10-11 DIAGNOSIS — M2550 Pain in unspecified joint: Secondary | ICD-10-CM | POA: Diagnosis not present

## 2024-10-11 DIAGNOSIS — M170 Bilateral primary osteoarthritis of knee: Secondary | ICD-10-CM

## 2024-10-11 DIAGNOSIS — E039 Hypothyroidism, unspecified: Secondary | ICD-10-CM | POA: Diagnosis not present

## 2024-10-11 MED ORDER — METHYLPREDNISOLONE 4 MG PO TBPK: ORAL_TABLET | ORAL | 0 refills | Status: DC

## 2024-10-11 MED ORDER — PREDNISONE 10 MG PO TABS: ORAL_TABLET | ORAL | 0 refills | Status: AC

## 2024-10-11 NOTE — Progress Notes (Signed)
 "  Edward MAROTTO Sr. - 54 y.o. male MRN [id number]  Date of birth: Dec 10, 1970  Office Visit Note: Visit Date: 10/11/2024 PCP: Delbert Clam, MD Referred by: Delbert Clam, MD  Subjective: No chief complaint on file.  HPI: Edward CERVI Sr. is a pleasant 54 y.o. male who presents today for total body pain and b/l knee pain..  Last labs from PCP.  Lab Results  Component Value Date   TSH 28.900 (H) 06/06/2024   T3TOTAL 42 (L) 06/06/2024   FREET4 0.36 (L) 06/06/2024    Discussed the use of AI scribe software for clinical note transcription with the patient, who gave verbal consent to proceed.  History of Present Illness Edward MARCELLA Sr. is a 54 year old male with bilateral patellofemoral osteoarthritis who presents for evaluation of persistent, diffuse musculoskeletal pain and fatigue.  He has chronic constant pain in the back, knees, shoulders, elbows, and arms, with variable intensity. The right arm is most symptomatic, which he associates with his fistula. He notes intermittent knee swelling, clicking, and popping, with prior knee effusions.  He has longstanding hypothyroidism with persistent low thyroid  levels and was increased to levothyroxine  200 mcg one month ago without change in pain, fatigue, or low energy. He reports cognitive symptoms such as memory issues and mental fog.  He avoids most pain medications because of his kidney transplant and uses only acetaminophen  as needed. He has gout with episodic flares and previously tried allopurinol , which was stopped due to intolerance.  He reports low motivation and energy that limit his ability to exercise. He has depression and some anxiety, which further reduces his activity level.  Pertinent ROS were reviewed with the patient and found to be negative unless otherwise specified above in HPI.   Assessment & Plan: Visit Diagnoses:  1. Hypothyroidism, unspecified type   2. Osteoarthritis of  patellofemoral joints, bilateral   3. Other fatigue   4. Arthralgia of multiple joints     Assessment and Plan Summary he has multiple joint arthralgias, fatigue, low body energy is emanating significantly from his hypothyroid.  He is rather significantly hypothyroid and has never been euthyroid over the past 4+ years.  Recommend he does see his referral to endocrinology to help manage this.  May need additional T3 supplementation.  He has been diagnosed with depression/anxiety in the past as well, could consider something such as Cymbalta  for this and his overall joint pain, but he would rather avoid long-term medications at this time.  Would recommend reevaluating his overall joint pain once he is euthyroid for about 6 months, as I have a high suspicion this would significantly improved Assessment & Plan Bilateral patellofemoral osteoarthritis Chronic bilateral patellofemoral osteoarthritis with joint space narrowing and osteophyte formation. Symptoms are intermittent and mechanical, not the primary contributor to generalized pain. - Discussed intra-articular knee injections as future option if symptoms worsen; not pursuing currently. - Consider PT/HEP  Generalized musculoskeletal pain and fatigue Persistent diffuse musculoskeletal pain and fatigue, likely secondary to uncontrolled hypothyroidism with possible contribution from wear and tear and prior gout. No acute inflammatory arthritis or active gout flare. - Prescribed 7-day prednisone  taper to assess response to anti-inflammatory therapy. - Instructed to monitor for improvement in pain and fatigue during and after prednisone  course and report lack of significant change, suggesting non-inflammatory etiology.  Hypothyroidism Chronic hypothyroidism with significantly low thyroid  levels, not achieving stable euthyroid state. Symptoms likely related to inadequate thyroid  hormone replacement. - Advised follow-up with endocrinology for specialized  management. - Encouraged contact with primary care to confirm endocrinology referral and/or appointment. - Did review his thyroid  levels over the past 4 years and he has never been euthyroid at that time, has been hypothyroid rather significantly at times - Did discuss with him that I do believe 90% of his joint pain, decreased energy, fatigue, etc. is emanating from his underactive thyroid   Multiple joint arthralgias Chronic arthralgias in knees, shoulders, elbows, and arms. No acute synovitis or significant swelling. Symptoms likely multifactorial, with hypothyroidism and generalized musculoskeletal pain as major contributors. - Prescribed short course of prednisone  to address possible inflammatory component. - Discussed that lack of improvement with prednisone  would suggest non-inflammatory, hypothyroid-related etiology.   Follow-up: Return if symptoms worsen or fail to improve.   Meds & Orders:  Meds ordered this encounter  Medications   DISCONTD: methylPREDNISolone  (MEDROL  DOSEPAK) 4 MG TBPK tablet    Sig: Take per packet instruction. Taper dosing.    Dispense:  1 each    Refill:  0   predniSONE  (DELTASONE ) 10 MG tablet    Sig: Take 3 tablets (30mg  total) for 3 days, then take 2 tablets for 2 days, then take 1 tablet for 2 days until completion    Dispense:  15 tablet    Refill:  0   No orders of the defined types were placed in this encounter.    Procedures: No procedures performed      Clinical History: No specialty comments available.  He reports that he quit smoking about 24 years ago. His smoking use included cigarettes. He started smoking about 27 years ago. He has a 2.3 pack-year smoking history. He has never used smokeless tobacco.  Recent Labs    11/01/23 1138 03/06/24 1343  HGBA1C 10.1* 8.5*    Objective:    Physical Exam  Gen: Well-appearing, in no acute distress; non-toxic CV: Well-perfused. Warm.  Resp: Breathing unlabored on room air; no  wheezing. Psych: Fluid speech in conversation; appropriate affect; normal thought process  *MSK/Ortho Exam: Physical Exam MUSCULOSKELETAL: No swelling in knees. Presence of clicking and popping in knees.  - Bilateral knees:   Imaging:  Narrative & Impression  CLINICAL DATA:  Low back pain with chronic weakness for 6 weeks   EXAM: MRI LUMBAR SPINE WITHOUT CONTRAST   TECHNIQUE: Multiplanar, multisequence MR imaging of the lumbar spine was performed. No intravenous contrast was administered.   COMPARISON:  04/17/2008   FINDINGS: Segmentation:  Standard.   Alignment:  Physiologic.   Vertebrae: No acute fracture, evidence of discitis, or aggressive bone lesion.   Conus medullaris and cauda equina: Conus extends to the T12-L1 level. Conus and cauda equina appear normal.   Paraspinal and other soft tissues: No acute paraspinal abnormality.   Disc levels:   Disc spaces: Minimal disc height loss at L5-S1 with Modic 2 endplate changes. Mild disc height loss at T12-L1 with mild Modic 1 endplate changes along the superior endplate of L1.   U87-O8: No significant disc bulge. No neural foraminal stenosis. No central canal stenosis.   L1-L2: No significant disc bulge. No neural foraminal stenosis. No central canal stenosis.   L2-L3: No significant disc bulge. Mild bilateral facet arthropathy. No foraminal or central canal stenosis.   L3-L4: Mild disc bulge. Mild bilateral facet arthropathy. No foraminal or central canal stenosis.   L4-L5: Mild disc bulge. Mild bilateral facet arthropathy. No foraminal or central canal stenosis.   L5-S1: Mild disc bulge. Mild bilateral facet arthropathy. Mild bilateral foraminal stenosis. No  central canal stenosis.   IMPRESSION: 1. Mild lumbar spine spondylosis as described above. No evidence of nerve root impingement. 2. No acute osseous injury of the lumbar spine.     Electronically Signed   By: Julaine Blanch M.D.   On: 10/07/2023  16:24   *Knee XR's 03/13/23: 4 views of bilateral knee including AP standing, Rosenberg, lateral and  sunrise view were ordered and reviewed by myself.  X-rays demonstrate mild  tricompartmental arthritic change but no advanced joint space narrowing.   There is superior and inferior patellar spurring, right greater than left.   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  No acute fracture or other  bony abnormality noted.   Past Medical/Family/Surgical/Social History: Medications & Allergies reviewed per EMR, new medications updated. 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   Mitral regurgitation, Moderate 09/05/2018   ED (erectile dysfunction) 08/10/2017   Chronic kidney disease, stage 5 (HCC) 07/18/2017   Graves disease 03/02/2016   Non compliance with medical treatment 11/26/2015   Uncontrolled hypertension 11/26/2015   Acute on chronic renal failure 11/25/2015   DM (diabetes mellitus), 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   Past Medical History:  Diagnosis Date   Acute on chronic renal failure 11/25/2015   Anemia    Arthritis    Blind    Left eye   Cataract    CHF (congestive heart failure) (HCC)    Chronic kidney disease    stage 5   COVID-19    Depression    situatuional   Diabetes mellitus    Type II   GERD (gastroesophageal reflux disease)    Graves  disease 2014   Headache    in past   Hx of adenomatous and sessile serrated colonic polyps 11/21/2019   Hyperlipidemia    Hypertension    Hypothyroidism    Legally blind    right eye   Neuropathy    Non-compliance    Proteinuria 05/24/2020   Shortness of breath dyspnea    Better since I've been taking my medications   Family History  Problem Relation Age of Onset   Hypertension Mother    Diabetes Mother    Bladder Cancer Brother    Kidney disease Other    Colon cancer Neg Hx    Rectal cancer Neg Hx    Esophageal cancer Neg Hx    Stomach cancer Neg Hx    Colon polyps Neg Hx    Past Surgical History:  Procedure Laterality Date   AV FISTULA PLACEMENT Right 04/22/2019   Procedure: RIGHT ARM ARTERIOVENOUS (AV) FISTULA CREATION;  Surgeon: Eliza Lonni RAMAN, MD;  Location: [facility] OR;  Service: Vascular;  Laterality: Right;   CIRCUMCISION     as a child, around 2 years of age   COLONOSCOPY     EYE SURGERY Bilateral    several    KIDNEY TRANSPLANT  01/01/2022   PARS PLANA VITRECTOMY  Right 03/25/2016   Procedure: PARS PLANA VITRECTOMY 25 GAUGE FOR ENDOPHTHALMITIS;  Surgeon: Onesimo Blanch, MD;  Location: [facility] OR;  Service: Ophthalmology;  Laterality: Right;   WISDOM TOOTH EXTRACTION     Social History   Occupational History   Occupation: disabled  Tobacco Use   Smoking status: Former    Current packs/day: 0.00    Average packs/day: 0.8 packs/day for 3.0 years (2.3 ttl pk-yrs)    Types: Cigarettes    Start date: 05/28/1997    Quit date: 05/28/2000    Years since quitting: 24.3   Smokeless tobacco: Never  Vaping Use   Vaping status: Never Used  Substance and Sexual Activity   Alcohol use: Not Currently    Alcohol/week: 0.0 standard drinks of alcohol    Comment: 1 beer  ocassional   Drug use: Yes    Types: Marijuana    Comment: daily for pain   Sexual activity: Yes   "

## 2024-10-25 ENCOUNTER — Encounter: Payer: Self-pay | Admitting: Podiatry

## 2024-10-25 ENCOUNTER — Ambulatory Visit: Admitting: Podiatry

## 2024-10-25 DIAGNOSIS — E1142 Type 2 diabetes mellitus with diabetic polyneuropathy: Secondary | ICD-10-CM

## 2024-10-25 DIAGNOSIS — B351 Tinea unguium: Secondary | ICD-10-CM | POA: Diagnosis not present

## 2024-10-25 NOTE — Progress Notes (Signed)
 This patient returns to my office for at risk foot care.  This patient requires this care by a professional since this patient will be at risk due to having ESRD, Blind,   and DM with polyneuropathy.  This patient is unable to cut nails himself since the patient cannot reach his nails.These nails are painful walking and wearing shoes.  Patient has kidney transplant.  Patient also has callus developing and occasional hurting  right big toe.This patient presents for at risk foot care today.  General Appearance  Alert, conversant and in no acute stress.  Vascular  Dorsalis pedis and posterior tibial  pulses are  weakly palpable  bilaterally.  Capillary return is within normal limits  bilaterally. Cold feet. bilaterally.  Neurologic  Senn-Weinstein monofilament wire test diminished  bilaterally. Muscle power within normal limits bilaterally.  Nails Thick disfigured discolored nails with subungual debris  from hallux to fifth toes bilaterally. No evidence of bacterial infection or drainage bilaterally.   Orthopedic  No limitations of motion  feet .  No crepitus or effusions noted.  No bony pathology or digital deformities noted.  HAV  B/L.   Skin  normotropic skin with no porokeratosis noted bilaterally.  No signs of infections or ulcers noted.  Callus distal aspect right hallux.   Onychomycosis  Pain in right toes  Pain in left toes  Consent was obtained for treatment procedures.   Mechanical debridement of nails 1-5  bilaterally performed with a nail nipper.  Filed with dremel without incident.    Return office visit  3 months                    Told patient to return for periodic foot care and evaluation due to potential at risk complications.   Cordella Bold DPM

## 2024-10-28 ENCOUNTER — Encounter: Payer: Self-pay | Admitting: *Deleted

## 2024-10-28 NOTE — Progress Notes (Signed)
 Edward CROME Pevey Sr.                                          MRN: [id number]   10/28/2024   The [facility] Quality Team Specialist reviewed this patient medical record for the purposes of chart review for care gap closure. The following were reviewed: abstraction for care gap closure-glycemic status assessment.    [facility] Quality Team

## 2024-12-04 ENCOUNTER — Ambulatory Visit: Admitting: Family Medicine

## 2024-12-10 ENCOUNTER — Ambulatory Visit

## 2025-01-23 ENCOUNTER — Ambulatory Visit: Admitting: Podiatry
